# Patient Record
Sex: Female | Born: 1965 | Race: Black or African American | Hispanic: No | Marital: Single | Smoking: Former smoker
Health system: Southern US, Community
[De-identification: ages and names within clinical notes are randomized; demographics above are authoritative.]

## PROBLEM LIST (undated history)

## (undated) DIAGNOSIS — K589 Irritable bowel syndrome without diarrhea: Secondary | ICD-10-CM

## (undated) DIAGNOSIS — G629 Polyneuropathy, unspecified: Secondary | ICD-10-CM

## (undated) DIAGNOSIS — G4733 Obstructive sleep apnea (adult) (pediatric): Secondary | ICD-10-CM

## (undated) DIAGNOSIS — I4721 Torsades de pointes: Secondary | ICD-10-CM

## (undated) DIAGNOSIS — E119 Type 2 diabetes mellitus without complications: Secondary | ICD-10-CM

## (undated) DIAGNOSIS — I428 Other cardiomyopathies: Secondary | ICD-10-CM

## (undated) DIAGNOSIS — J189 Pneumonia, unspecified organism: Secondary | ICD-10-CM

## (undated) DIAGNOSIS — I472 Ventricular tachycardia: Secondary | ICD-10-CM

## (undated) DIAGNOSIS — I340 Nonrheumatic mitral (valve) insufficiency: Secondary | ICD-10-CM

## (undated) DIAGNOSIS — K219 Gastro-esophageal reflux disease without esophagitis: Secondary | ICD-10-CM

## (undated) DIAGNOSIS — I447 Left bundle-branch block, unspecified: Secondary | ICD-10-CM

## (undated) DIAGNOSIS — Q2730 Arteriovenous malformation, site unspecified: Secondary | ICD-10-CM

## (undated) DIAGNOSIS — I10 Essential (primary) hypertension: Secondary | ICD-10-CM

## (undated) DIAGNOSIS — D509 Iron deficiency anemia, unspecified: Secondary | ICD-10-CM

## (undated) DIAGNOSIS — I509 Heart failure, unspecified: Secondary | ICD-10-CM

## (undated) HISTORY — PX: APPENDECTOMY: SHX54

## (undated) HISTORY — DX: Left bundle-branch block, unspecified: I44.7

## (undated) HISTORY — DX: Heart failure, unspecified: I50.9

## (undated) HISTORY — DX: Gastro-esophageal reflux disease without esophagitis: K21.9

## (undated) HISTORY — DX: Iron deficiency anemia, unspecified: D50.9

## (undated) HISTORY — PX: CHOLECYSTECTOMY: SHX55

## (undated) HISTORY — PX: BREAST SURGERY: SHX581

## (undated) HISTORY — DX: Other cardiomyopathies: I42.8

## (undated) HISTORY — DX: Ventricular tachycardia: I47.2

## (undated) HISTORY — PX: COLONOSCOPY: SHX174

## (undated) HISTORY — PX: SMALL BOWEL ENTEROSCOPY: SHX2415

## (undated) HISTORY — DX: Irritable bowel syndrome, unspecified: K58.9

## (undated) HISTORY — DX: Torsades de pointes: I47.21

## (undated) HISTORY — DX: Essential (primary) hypertension: I10

## (undated) HISTORY — DX: Nonrheumatic mitral (valve) insufficiency: I34.0

## (undated) HISTORY — DX: Morbid (severe) obesity due to excess calories: E66.01

## (undated) HISTORY — PX: UPPER GASTROINTESTINAL ENDOSCOPY: SHX188

---

## 2001-04-09 ENCOUNTER — Ambulatory Visit (HOSPITAL_COMMUNITY): Admission: RE | Admit: 2001-04-09 | Discharge: 2001-04-09 | Payer: Self-pay | Admitting: Family Medicine

## 2001-04-09 ENCOUNTER — Encounter: Payer: Self-pay | Admitting: Family Medicine

## 2001-05-08 ENCOUNTER — Encounter: Payer: Self-pay | Admitting: *Deleted

## 2001-05-08 ENCOUNTER — Emergency Department (HOSPITAL_COMMUNITY): Admission: EM | Admit: 2001-05-08 | Discharge: 2001-05-08 | Payer: Self-pay | Admitting: *Deleted

## 2001-06-30 ENCOUNTER — Emergency Department (HOSPITAL_COMMUNITY): Admission: EM | Admit: 2001-06-30 | Discharge: 2001-06-30 | Payer: Self-pay | Admitting: *Deleted

## 2001-06-30 ENCOUNTER — Encounter: Payer: Self-pay | Admitting: *Deleted

## 2001-07-03 ENCOUNTER — Encounter: Payer: Self-pay | Admitting: Obstetrics and Gynecology

## 2001-07-03 ENCOUNTER — Ambulatory Visit (HOSPITAL_COMMUNITY): Admission: RE | Admit: 2001-07-03 | Discharge: 2001-07-03 | Payer: Self-pay | Admitting: Obstetrics and Gynecology

## 2001-08-22 ENCOUNTER — Encounter (HOSPITAL_COMMUNITY): Admission: RE | Admit: 2001-08-22 | Discharge: 2001-09-21 | Payer: Self-pay | Admitting: Rheumatology

## 2001-08-23 ENCOUNTER — Emergency Department (HOSPITAL_COMMUNITY): Admission: EM | Admit: 2001-08-23 | Discharge: 2001-08-23 | Payer: Self-pay | Admitting: Emergency Medicine

## 2001-09-11 HISTORY — PX: OTHER SURGICAL HISTORY: SHX169

## 2001-10-10 ENCOUNTER — Emergency Department (HOSPITAL_COMMUNITY): Admission: EM | Admit: 2001-10-10 | Discharge: 2001-10-10 | Payer: Self-pay | Admitting: *Deleted

## 2001-10-24 ENCOUNTER — Encounter (HOSPITAL_COMMUNITY): Admission: RE | Admit: 2001-10-24 | Discharge: 2001-11-23 | Payer: Self-pay | Admitting: Rheumatology

## 2001-12-10 ENCOUNTER — Encounter: Payer: Self-pay | Admitting: Emergency Medicine

## 2001-12-10 ENCOUNTER — Emergency Department (HOSPITAL_COMMUNITY): Admission: EM | Admit: 2001-12-10 | Discharge: 2001-12-10 | Payer: Self-pay | Admitting: Emergency Medicine

## 2002-05-03 ENCOUNTER — Encounter: Payer: Self-pay | Admitting: *Deleted

## 2002-05-03 ENCOUNTER — Emergency Department (HOSPITAL_COMMUNITY): Admission: EM | Admit: 2002-05-03 | Discharge: 2002-05-03 | Payer: Self-pay | Admitting: *Deleted

## 2002-05-07 ENCOUNTER — Encounter: Payer: Self-pay | Admitting: Orthopaedic Surgery

## 2002-05-07 ENCOUNTER — Ambulatory Visit (HOSPITAL_COMMUNITY): Admission: RE | Admit: 2002-05-07 | Discharge: 2002-05-07 | Payer: Self-pay | Admitting: Orthopaedic Surgery

## 2002-06-23 ENCOUNTER — Emergency Department (HOSPITAL_COMMUNITY): Admission: EM | Admit: 2002-06-23 | Discharge: 2002-06-23 | Payer: Self-pay | Admitting: *Deleted

## 2002-07-03 ENCOUNTER — Encounter: Payer: Self-pay | Admitting: Obstetrics and Gynecology

## 2002-07-03 ENCOUNTER — Ambulatory Visit (HOSPITAL_COMMUNITY): Admission: RE | Admit: 2002-07-03 | Discharge: 2002-07-03 | Payer: Self-pay | Admitting: Obstetrics and Gynecology

## 2002-07-18 ENCOUNTER — Ambulatory Visit (HOSPITAL_COMMUNITY): Admission: RE | Admit: 2002-07-18 | Discharge: 2002-07-18 | Payer: Self-pay | Admitting: General Surgery

## 2002-11-02 ENCOUNTER — Emergency Department (HOSPITAL_COMMUNITY): Admission: EM | Admit: 2002-11-02 | Discharge: 2002-11-03 | Payer: Self-pay | Admitting: Internal Medicine

## 2002-11-02 ENCOUNTER — Encounter: Payer: Self-pay | Admitting: *Deleted

## 2002-11-12 ENCOUNTER — Encounter: Payer: Self-pay | Admitting: Internal Medicine

## 2002-11-12 ENCOUNTER — Ambulatory Visit (HOSPITAL_COMMUNITY): Admission: RE | Admit: 2002-11-12 | Discharge: 2002-11-12 | Payer: Self-pay | Admitting: Internal Medicine

## 2002-11-28 ENCOUNTER — Encounter (HOSPITAL_COMMUNITY): Admission: RE | Admit: 2002-11-28 | Discharge: 2002-12-28 | Payer: Self-pay | Admitting: Internal Medicine

## 2002-11-28 ENCOUNTER — Encounter: Payer: Self-pay | Admitting: Internal Medicine

## 2002-12-09 ENCOUNTER — Encounter: Payer: Self-pay | Admitting: Internal Medicine

## 2002-12-12 ENCOUNTER — Emergency Department (HOSPITAL_COMMUNITY): Admission: EM | Admit: 2002-12-12 | Discharge: 2002-12-12 | Payer: Self-pay | Admitting: *Deleted

## 2003-01-07 ENCOUNTER — Ambulatory Visit (HOSPITAL_COMMUNITY): Admission: RE | Admit: 2003-01-07 | Discharge: 2003-01-07 | Payer: Self-pay | Admitting: General Surgery

## 2003-04-24 ENCOUNTER — Emergency Department (HOSPITAL_COMMUNITY): Admission: EM | Admit: 2003-04-24 | Discharge: 2003-04-24 | Payer: Self-pay | Admitting: Emergency Medicine

## 2003-05-01 ENCOUNTER — Ambulatory Visit: Admission: RE | Admit: 2003-05-01 | Discharge: 2003-05-01 | Payer: Self-pay | Admitting: Orthopedic Surgery

## 2003-05-01 ENCOUNTER — Encounter: Payer: Self-pay | Admitting: Orthopedic Surgery

## 2003-05-04 ENCOUNTER — Encounter: Payer: Self-pay | Admitting: Orthopedic Surgery

## 2003-05-04 ENCOUNTER — Ambulatory Visit (HOSPITAL_COMMUNITY): Admission: RE | Admit: 2003-05-04 | Discharge: 2003-05-04 | Payer: Self-pay | Admitting: Orthopedic Surgery

## 2003-07-29 ENCOUNTER — Ambulatory Visit (HOSPITAL_COMMUNITY): Admission: RE | Admit: 2003-07-29 | Discharge: 2003-07-29 | Payer: Self-pay | Admitting: General Surgery

## 2003-09-12 HISTORY — PX: KNEE ARTHROSCOPY: SUR90

## 2003-10-06 ENCOUNTER — Ambulatory Visit (HOSPITAL_COMMUNITY): Admission: RE | Admit: 2003-10-06 | Discharge: 2003-10-06 | Payer: Self-pay | Admitting: Ophthalmology

## 2003-10-13 ENCOUNTER — Encounter (HOSPITAL_COMMUNITY): Admission: RE | Admit: 2003-10-13 | Discharge: 2003-11-12 | Payer: Self-pay | Admitting: Orthopedic Surgery

## 2003-11-13 ENCOUNTER — Encounter (HOSPITAL_COMMUNITY): Admission: RE | Admit: 2003-11-13 | Discharge: 2003-12-13 | Payer: Self-pay | Admitting: Orthopedic Surgery

## 2003-11-17 ENCOUNTER — Emergency Department (HOSPITAL_COMMUNITY): Admission: EM | Admit: 2003-11-17 | Discharge: 2003-11-18 | Payer: Self-pay | Admitting: Emergency Medicine

## 2004-01-07 ENCOUNTER — Ambulatory Visit (HOSPITAL_COMMUNITY): Admission: RE | Admit: 2004-01-07 | Discharge: 2004-01-07 | Payer: Self-pay | Admitting: Family Medicine

## 2004-01-25 ENCOUNTER — Ambulatory Visit (HOSPITAL_COMMUNITY): Admission: RE | Admit: 2004-01-25 | Discharge: 2004-01-25 | Payer: Self-pay | Admitting: Family Medicine

## 2004-03-02 ENCOUNTER — Ambulatory Visit (HOSPITAL_COMMUNITY): Admission: RE | Admit: 2004-03-02 | Discharge: 2004-03-02 | Payer: Self-pay | Admitting: Family Medicine

## 2004-04-26 ENCOUNTER — Ambulatory Visit (HOSPITAL_COMMUNITY): Admission: RE | Admit: 2004-04-26 | Discharge: 2004-04-26 | Payer: Self-pay | Admitting: Neurology

## 2004-05-26 ENCOUNTER — Encounter (HOSPITAL_COMMUNITY): Admission: RE | Admit: 2004-05-26 | Discharge: 2004-06-10 | Payer: Self-pay | Admitting: Neurology

## 2004-06-14 ENCOUNTER — Encounter (HOSPITAL_COMMUNITY): Admission: RE | Admit: 2004-06-14 | Discharge: 2004-07-14 | Payer: Self-pay | Admitting: Neurology

## 2004-08-22 ENCOUNTER — Encounter (HOSPITAL_COMMUNITY): Admission: RE | Admit: 2004-08-22 | Discharge: 2004-09-21 | Payer: Self-pay | Admitting: Internal Medicine

## 2004-08-23 ENCOUNTER — Emergency Department (HOSPITAL_COMMUNITY): Admission: EM | Admit: 2004-08-23 | Discharge: 2004-08-23 | Payer: Self-pay | Admitting: Emergency Medicine

## 2005-05-05 ENCOUNTER — Ambulatory Visit: Payer: Self-pay | Admitting: *Deleted

## 2005-06-05 ENCOUNTER — Ambulatory Visit: Payer: Self-pay | Admitting: *Deleted

## 2005-06-05 ENCOUNTER — Ambulatory Visit: Payer: Self-pay | Admitting: Cardiology

## 2005-06-05 ENCOUNTER — Encounter (HOSPITAL_COMMUNITY): Admission: RE | Admit: 2005-06-05 | Discharge: 2005-06-06 | Payer: Self-pay | Admitting: *Deleted

## 2005-06-14 ENCOUNTER — Ambulatory Visit: Payer: Self-pay | Admitting: *Deleted

## 2005-06-29 ENCOUNTER — Ambulatory Visit (HOSPITAL_COMMUNITY): Admission: RE | Admit: 2005-06-29 | Discharge: 2005-06-29 | Payer: Self-pay | Admitting: *Deleted

## 2005-06-29 ENCOUNTER — Ambulatory Visit: Payer: Self-pay | Admitting: *Deleted

## 2005-07-13 ENCOUNTER — Ambulatory Visit: Payer: Self-pay | Admitting: *Deleted

## 2005-07-21 ENCOUNTER — Ambulatory Visit: Payer: Self-pay

## 2005-08-09 ENCOUNTER — Ambulatory Visit: Payer: Self-pay | Admitting: *Deleted

## 2005-10-27 ENCOUNTER — Ambulatory Visit: Payer: Self-pay | Admitting: *Deleted

## 2005-11-02 ENCOUNTER — Ambulatory Visit: Payer: Self-pay | Admitting: Cardiology

## 2005-11-06 ENCOUNTER — Ambulatory Visit: Payer: Self-pay | Admitting: *Deleted

## 2005-11-19 ENCOUNTER — Emergency Department (HOSPITAL_COMMUNITY): Admission: EM | Admit: 2005-11-19 | Discharge: 2005-11-19 | Payer: Self-pay | Admitting: Emergency Medicine

## 2005-12-06 ENCOUNTER — Ambulatory Visit: Payer: Self-pay | Admitting: Internal Medicine

## 2005-12-21 ENCOUNTER — Ambulatory Visit: Payer: Self-pay | Admitting: Internal Medicine

## 2005-12-21 ENCOUNTER — Inpatient Hospital Stay (HOSPITAL_COMMUNITY): Admission: AD | Admit: 2005-12-21 | Discharge: 2005-12-22 | Payer: Self-pay | Admitting: Internal Medicine

## 2005-12-22 ENCOUNTER — Encounter: Payer: Self-pay | Admitting: Internal Medicine

## 2006-01-10 ENCOUNTER — Ambulatory Visit: Payer: Self-pay

## 2006-01-23 ENCOUNTER — Ambulatory Visit: Payer: Self-pay | Admitting: *Deleted

## 2006-03-07 ENCOUNTER — Ambulatory Visit: Payer: Self-pay | Admitting: Internal Medicine

## 2006-03-16 ENCOUNTER — Ambulatory Visit: Payer: Self-pay

## 2006-03-29 ENCOUNTER — Ambulatory Visit: Payer: Self-pay | Admitting: Internal Medicine

## 2006-05-04 ENCOUNTER — Ambulatory Visit (HOSPITAL_COMMUNITY): Admission: RE | Admit: 2006-05-04 | Discharge: 2006-05-04 | Payer: Self-pay | Admitting: Cardiology

## 2006-05-04 ENCOUNTER — Ambulatory Visit: Payer: Self-pay | Admitting: Internal Medicine

## 2006-05-11 ENCOUNTER — Ambulatory Visit: Payer: Self-pay | Admitting: *Deleted

## 2006-05-30 ENCOUNTER — Ambulatory Visit: Payer: Self-pay

## 2006-05-30 ENCOUNTER — Encounter: Payer: Self-pay | Admitting: Cardiovascular Disease

## 2006-05-31 ENCOUNTER — Ambulatory Visit: Payer: Self-pay | Admitting: Internal Medicine

## 2006-06-14 ENCOUNTER — Ambulatory Visit: Payer: Self-pay | Admitting: Internal Medicine

## 2006-06-21 ENCOUNTER — Ambulatory Visit: Payer: Self-pay | Admitting: Internal Medicine

## 2006-07-05 ENCOUNTER — Ambulatory Visit: Payer: Self-pay | Admitting: Internal Medicine

## 2006-07-23 ENCOUNTER — Ambulatory Visit: Payer: Self-pay | Admitting: Internal Medicine

## 2006-08-11 ENCOUNTER — Emergency Department (HOSPITAL_COMMUNITY): Admission: EM | Admit: 2006-08-11 | Discharge: 2006-08-12 | Payer: Self-pay | Admitting: Emergency Medicine

## 2006-11-01 ENCOUNTER — Ambulatory Visit: Payer: Self-pay | Admitting: Internal Medicine

## 2006-12-04 ENCOUNTER — Emergency Department (HOSPITAL_COMMUNITY): Admission: EM | Admit: 2006-12-04 | Discharge: 2006-12-04 | Payer: Self-pay | Admitting: Emergency Medicine

## 2007-05-13 ENCOUNTER — Emergency Department (HOSPITAL_COMMUNITY): Admission: EM | Admit: 2007-05-13 | Discharge: 2007-05-13 | Payer: Self-pay | Admitting: Emergency Medicine

## 2007-05-13 ENCOUNTER — Ambulatory Visit: Payer: Self-pay | Admitting: Internal Medicine

## 2007-05-23 ENCOUNTER — Emergency Department (HOSPITAL_COMMUNITY): Admission: EM | Admit: 2007-05-23 | Discharge: 2007-05-23 | Payer: Self-pay | Admitting: Emergency Medicine

## 2007-06-05 ENCOUNTER — Ambulatory Visit: Payer: Self-pay | Admitting: Internal Medicine

## 2007-08-29 ENCOUNTER — Ambulatory Visit: Payer: Self-pay | Admitting: Internal Medicine

## 2007-10-24 ENCOUNTER — Ambulatory Visit (HOSPITAL_COMMUNITY): Admission: RE | Admit: 2007-10-24 | Discharge: 2007-10-24 | Payer: Self-pay | Admitting: Family Medicine

## 2007-11-25 ENCOUNTER — Emergency Department (HOSPITAL_COMMUNITY): Admission: EM | Admit: 2007-11-25 | Discharge: 2007-11-25 | Payer: Self-pay | Admitting: Emergency Medicine

## 2008-02-12 ENCOUNTER — Ambulatory Visit (HOSPITAL_COMMUNITY): Admission: RE | Admit: 2008-02-12 | Discharge: 2008-02-12 | Payer: Self-pay | Admitting: Family Medicine

## 2008-02-14 ENCOUNTER — Inpatient Hospital Stay (HOSPITAL_COMMUNITY): Admission: AD | Admit: 2008-02-14 | Discharge: 2008-02-17 | Payer: Self-pay | Admitting: Family Medicine

## 2008-02-14 ENCOUNTER — Ambulatory Visit: Payer: Self-pay | Admitting: Cardiology

## 2008-02-27 ENCOUNTER — Ambulatory Visit: Payer: Self-pay | Admitting: Internal Medicine

## 2008-02-28 ENCOUNTER — Ambulatory Visit: Payer: Self-pay | Admitting: Internal Medicine

## 2008-04-15 ENCOUNTER — Ambulatory Visit: Payer: Self-pay | Admitting: Cardiology

## 2008-05-22 ENCOUNTER — Ambulatory Visit: Payer: Self-pay | Admitting: Cardiology

## 2008-06-08 ENCOUNTER — Emergency Department (HOSPITAL_COMMUNITY): Admission: EM | Admit: 2008-06-08 | Discharge: 2008-06-08 | Payer: Self-pay | Admitting: Emergency Medicine

## 2008-06-17 ENCOUNTER — Ambulatory Visit (HOSPITAL_COMMUNITY): Admission: RE | Admit: 2008-06-17 | Discharge: 2008-06-17 | Payer: Self-pay | Admitting: Family Medicine

## 2008-07-08 ENCOUNTER — Emergency Department (HOSPITAL_COMMUNITY): Admission: EM | Admit: 2008-07-08 | Discharge: 2008-07-08 | Payer: Self-pay | Admitting: Emergency Medicine

## 2008-07-22 ENCOUNTER — Ambulatory Visit: Payer: Self-pay | Admitting: Internal Medicine

## 2008-07-27 ENCOUNTER — Ambulatory Visit: Payer: Self-pay | Admitting: Cardiology

## 2008-10-22 ENCOUNTER — Ambulatory Visit: Payer: Self-pay | Admitting: Internal Medicine

## 2008-11-04 ENCOUNTER — Ambulatory Visit: Payer: Self-pay | Admitting: Cardiology

## 2008-11-04 ENCOUNTER — Inpatient Hospital Stay (HOSPITAL_COMMUNITY): Admission: EM | Admit: 2008-11-04 | Discharge: 2008-11-06 | Payer: Self-pay | Admitting: Emergency Medicine

## 2008-12-08 ENCOUNTER — Encounter: Payer: Self-pay | Admitting: Internal Medicine

## 2008-12-17 ENCOUNTER — Ambulatory Visit: Payer: Self-pay | Admitting: Cardiology

## 2009-01-21 ENCOUNTER — Ambulatory Visit: Payer: Self-pay | Admitting: Internal Medicine

## 2009-03-23 ENCOUNTER — Encounter (INDEPENDENT_AMBULATORY_CARE_PROVIDER_SITE_OTHER): Payer: Self-pay | Admitting: *Deleted

## 2009-03-25 ENCOUNTER — Ambulatory Visit: Payer: Self-pay | Admitting: Cardiology

## 2009-03-26 LAB — CONVERTED CEMR LAB
BUN: 8 mg/dL (ref 6–23)
Calcium: 8.1 mg/dL — ABNORMAL LOW (ref 8.4–10.5)
Creatinine, Ser: 0.81 mg/dL (ref 0.40–1.20)
Glucose, Bld: 108 mg/dL — ABNORMAL HIGH (ref 70–99)

## 2009-03-29 ENCOUNTER — Telehealth (INDEPENDENT_AMBULATORY_CARE_PROVIDER_SITE_OTHER): Payer: Self-pay | Admitting: *Deleted

## 2009-04-20 ENCOUNTER — Ambulatory Visit (HOSPITAL_COMMUNITY): Admission: RE | Admit: 2009-04-20 | Discharge: 2009-04-20 | Payer: Self-pay | Admitting: Family Medicine

## 2009-04-22 ENCOUNTER — Ambulatory Visit: Payer: Self-pay | Admitting: Internal Medicine

## 2009-04-22 ENCOUNTER — Encounter: Payer: Self-pay | Admitting: Internal Medicine

## 2009-04-27 ENCOUNTER — Encounter: Payer: Self-pay | Admitting: Internal Medicine

## 2009-05-19 ENCOUNTER — Other Ambulatory Visit: Admission: RE | Admit: 2009-05-19 | Discharge: 2009-05-19 | Payer: Self-pay | Admitting: Obstetrics & Gynecology

## 2009-05-24 ENCOUNTER — Ambulatory Visit (HOSPITAL_COMMUNITY): Admission: RE | Admit: 2009-05-24 | Discharge: 2009-05-24 | Payer: Self-pay | Admitting: Obstetrics & Gynecology

## 2009-06-15 ENCOUNTER — Encounter: Payer: Self-pay | Admitting: Cardiology

## 2009-06-18 ENCOUNTER — Emergency Department (HOSPITAL_COMMUNITY): Admission: EM | Admit: 2009-06-18 | Discharge: 2009-06-18 | Payer: Self-pay | Admitting: Emergency Medicine

## 2009-07-03 ENCOUNTER — Inpatient Hospital Stay (HOSPITAL_COMMUNITY): Admission: EM | Admit: 2009-07-03 | Discharge: 2009-07-06 | Payer: Self-pay | Admitting: Emergency Medicine

## 2009-07-03 ENCOUNTER — Ambulatory Visit: Payer: Self-pay | Admitting: Cardiology

## 2009-07-04 ENCOUNTER — Ambulatory Visit: Payer: Self-pay | Admitting: Gastroenterology

## 2009-07-05 ENCOUNTER — Telehealth: Payer: Self-pay | Admitting: Gastroenterology

## 2009-07-05 ENCOUNTER — Ambulatory Visit: Payer: Self-pay | Admitting: Gastroenterology

## 2009-07-05 ENCOUNTER — Encounter: Payer: Self-pay | Admitting: Gastroenterology

## 2009-07-06 ENCOUNTER — Encounter (INDEPENDENT_AMBULATORY_CARE_PROVIDER_SITE_OTHER): Payer: Self-pay | Admitting: Family Medicine

## 2009-07-08 ENCOUNTER — Encounter: Payer: Self-pay | Admitting: Gastroenterology

## 2009-07-12 ENCOUNTER — Encounter (INDEPENDENT_AMBULATORY_CARE_PROVIDER_SITE_OTHER): Payer: Self-pay

## 2009-07-12 HISTORY — PX: GIVENS CAPSULE STUDY: SHX5432

## 2009-07-12 LAB — CONVERTED CEMR LAB
BUN: 9 mg/dL
Chloride: 102 meq/L
Hgb A1c MFr Bld: 6.1 %
MCV: 71.7 fL
Potassium: 4.1 meq/L

## 2009-07-15 ENCOUNTER — Ambulatory Visit (HOSPITAL_COMMUNITY): Admission: RE | Admit: 2009-07-15 | Discharge: 2009-07-15 | Payer: Self-pay | Admitting: Gastroenterology

## 2009-07-15 ENCOUNTER — Encounter: Payer: Self-pay | Admitting: Urgent Care

## 2009-07-15 ENCOUNTER — Ambulatory Visit: Payer: Self-pay | Admitting: Gastroenterology

## 2009-07-16 ENCOUNTER — Encounter (INDEPENDENT_AMBULATORY_CARE_PROVIDER_SITE_OTHER): Payer: Self-pay

## 2009-07-19 ENCOUNTER — Encounter: Payer: Self-pay | Admitting: Gastroenterology

## 2009-07-19 DIAGNOSIS — D509 Iron deficiency anemia, unspecified: Secondary | ICD-10-CM

## 2009-07-22 ENCOUNTER — Ambulatory Visit: Payer: Self-pay | Admitting: Internal Medicine

## 2009-07-26 ENCOUNTER — Encounter: Payer: Self-pay | Admitting: Internal Medicine

## 2009-07-29 ENCOUNTER — Encounter: Payer: Self-pay | Admitting: Internal Medicine

## 2009-08-27 ENCOUNTER — Encounter (INDEPENDENT_AMBULATORY_CARE_PROVIDER_SITE_OTHER): Payer: Self-pay

## 2009-09-14 ENCOUNTER — Encounter: Payer: Self-pay | Admitting: Cardiology

## 2009-09-15 ENCOUNTER — Ambulatory Visit: Payer: Self-pay | Admitting: Cardiology

## 2009-09-15 DIAGNOSIS — I5022 Chronic systolic (congestive) heart failure: Secondary | ICD-10-CM

## 2009-09-20 ENCOUNTER — Encounter: Payer: Self-pay | Admitting: Cardiology

## 2009-09-20 ENCOUNTER — Ambulatory Visit (HOSPITAL_COMMUNITY): Admission: RE | Admit: 2009-09-20 | Discharge: 2009-09-20 | Payer: Self-pay | Admitting: Cardiology

## 2009-09-20 ENCOUNTER — Ambulatory Visit: Payer: Self-pay | Admitting: Cardiology

## 2009-09-21 ENCOUNTER — Encounter (INDEPENDENT_AMBULATORY_CARE_PROVIDER_SITE_OTHER): Payer: Self-pay | Admitting: *Deleted

## 2009-09-22 ENCOUNTER — Encounter (INDEPENDENT_AMBULATORY_CARE_PROVIDER_SITE_OTHER): Payer: Self-pay | Admitting: *Deleted

## 2009-10-12 DIAGNOSIS — K219 Gastro-esophageal reflux disease without esophagitis: Secondary | ICD-10-CM | POA: Insufficient documentation

## 2009-10-21 ENCOUNTER — Ambulatory Visit: Payer: Self-pay | Admitting: Internal Medicine

## 2009-11-05 ENCOUNTER — Encounter: Payer: Self-pay | Admitting: Internal Medicine

## 2009-11-19 ENCOUNTER — Ambulatory Visit: Payer: Self-pay | Admitting: Cardiology

## 2009-12-31 ENCOUNTER — Inpatient Hospital Stay (HOSPITAL_COMMUNITY): Admission: EM | Admit: 2009-12-31 | Discharge: 2010-01-03 | Payer: Self-pay | Admitting: Emergency Medicine

## 2010-01-05 ENCOUNTER — Emergency Department (HOSPITAL_COMMUNITY): Admission: EM | Admit: 2010-01-05 | Discharge: 2010-01-05 | Payer: Self-pay | Admitting: Emergency Medicine

## 2010-01-31 ENCOUNTER — Encounter (INDEPENDENT_AMBULATORY_CARE_PROVIDER_SITE_OTHER): Payer: Self-pay | Admitting: *Deleted

## 2010-03-01 ENCOUNTER — Ambulatory Visit: Payer: Self-pay | Admitting: Internal Medicine

## 2010-03-01 DIAGNOSIS — Z95810 Presence of automatic (implantable) cardiac defibrillator: Secondary | ICD-10-CM

## 2010-04-28 ENCOUNTER — Observation Stay (HOSPITAL_COMMUNITY): Admission: EM | Admit: 2010-04-28 | Discharge: 2010-04-29 | Payer: Self-pay | Admitting: Emergency Medicine

## 2010-04-29 ENCOUNTER — Telehealth: Payer: Self-pay | Admitting: Gastroenterology

## 2010-05-04 ENCOUNTER — Ambulatory Visit: Payer: Self-pay | Admitting: Gastroenterology

## 2010-05-04 DIAGNOSIS — K625 Hemorrhage of anus and rectum: Secondary | ICD-10-CM

## 2010-05-04 DIAGNOSIS — K5900 Constipation, unspecified: Secondary | ICD-10-CM | POA: Insufficient documentation

## 2010-05-06 ENCOUNTER — Encounter: Payer: Self-pay | Admitting: Gastroenterology

## 2010-05-09 ENCOUNTER — Encounter: Payer: Self-pay | Admitting: Gastroenterology

## 2010-05-17 ENCOUNTER — Telehealth (INDEPENDENT_AMBULATORY_CARE_PROVIDER_SITE_OTHER): Payer: Self-pay

## 2010-05-20 ENCOUNTER — Telehealth (INDEPENDENT_AMBULATORY_CARE_PROVIDER_SITE_OTHER): Payer: Self-pay

## 2010-05-26 ENCOUNTER — Ambulatory Visit (HOSPITAL_COMMUNITY): Admission: RE | Admit: 2010-05-26 | Discharge: 2010-05-26 | Payer: Self-pay | Admitting: Hematology

## 2010-05-26 ENCOUNTER — Ambulatory Visit: Payer: Self-pay | Admitting: Gastroenterology

## 2010-06-02 ENCOUNTER — Ambulatory Visit: Payer: Self-pay | Admitting: Internal Medicine

## 2010-06-10 ENCOUNTER — Ambulatory Visit (HOSPITAL_COMMUNITY): Payer: Self-pay | Admitting: Oncology

## 2010-06-14 ENCOUNTER — Ambulatory Visit (HOSPITAL_COMMUNITY): Payer: Self-pay | Admitting: Oncology

## 2010-06-14 ENCOUNTER — Encounter (HOSPITAL_COMMUNITY)
Admission: RE | Admit: 2010-06-14 | Discharge: 2010-07-14 | Payer: Self-pay | Source: Home / Self Care | Admitting: Oncology

## 2010-07-04 ENCOUNTER — Ambulatory Visit: Payer: Self-pay | Admitting: Cardiology

## 2010-07-18 ENCOUNTER — Ambulatory Visit (HOSPITAL_COMMUNITY): Admission: RE | Admit: 2010-07-18 | Discharge: 2010-07-18 | Payer: Self-pay | Admitting: Family Medicine

## 2010-07-28 ENCOUNTER — Encounter (INDEPENDENT_AMBULATORY_CARE_PROVIDER_SITE_OTHER): Payer: Self-pay | Admitting: *Deleted

## 2010-08-01 ENCOUNTER — Ambulatory Visit (HOSPITAL_COMMUNITY): Payer: Self-pay | Admitting: Oncology

## 2010-08-01 ENCOUNTER — Encounter (HOSPITAL_COMMUNITY)
Admission: RE | Admit: 2010-08-01 | Discharge: 2010-08-31 | Payer: Self-pay | Source: Home / Self Care | Attending: Oncology | Admitting: Oncology

## 2010-08-10 ENCOUNTER — Encounter: Payer: Self-pay | Admitting: Gastroenterology

## 2010-08-15 ENCOUNTER — Ambulatory Visit: Payer: Self-pay | Admitting: Internal Medicine

## 2010-08-15 DIAGNOSIS — M549 Dorsalgia, unspecified: Secondary | ICD-10-CM | POA: Insufficient documentation

## 2010-09-01 ENCOUNTER — Encounter (HOSPITAL_COMMUNITY)
Admission: RE | Admit: 2010-09-01 | Discharge: 2010-10-01 | Payer: Self-pay | Source: Home / Self Care | Attending: Oncology | Admitting: Oncology

## 2010-09-08 ENCOUNTER — Ambulatory Visit: Payer: Self-pay | Admitting: Internal Medicine

## 2010-09-12 ENCOUNTER — Emergency Department (HOSPITAL_COMMUNITY)
Admission: EM | Admit: 2010-09-12 | Discharge: 2010-09-12 | Payer: Self-pay | Source: Home / Self Care | Admitting: Emergency Medicine

## 2010-09-13 ENCOUNTER — Encounter: Payer: Self-pay | Admitting: Internal Medicine

## 2010-09-14 ENCOUNTER — Ambulatory Visit (HOSPITAL_COMMUNITY)
Admission: RE | Admit: 2010-09-14 | Discharge: 2010-09-14 | Payer: Self-pay | Source: Home / Self Care | Attending: Family Medicine | Admitting: Family Medicine

## 2010-09-15 ENCOUNTER — Encounter: Payer: Self-pay | Admitting: Internal Medicine

## 2010-09-28 ENCOUNTER — Ambulatory Visit (HOSPITAL_COMMUNITY)
Admission: RE | Admit: 2010-09-28 | Discharge: 2010-10-11 | Payer: Self-pay | Source: Home / Self Care | Attending: Oncology | Admitting: Oncology

## 2010-10-03 ENCOUNTER — Encounter (HOSPITAL_COMMUNITY)
Admission: RE | Admit: 2010-10-03 | Discharge: 2010-10-11 | Payer: Self-pay | Source: Home / Self Care | Attending: Oncology | Admitting: Oncology

## 2010-10-03 LAB — RETICULOCYTES
Retic Count, Absolute: 43.1 10*3/uL (ref 19.0–186.0)
Retic Ct Pct: 1.1 % (ref 0.4–3.1)

## 2010-10-03 LAB — CBC
Hemoglobin: 10.4 g/dL — ABNORMAL LOW (ref 12.0–15.0)
MCV: 84.2 fL (ref 78.0–100.0)
Platelets: 404 10*3/uL — ABNORMAL HIGH (ref 150–400)
RBC: 3.92 MIL/uL (ref 3.87–5.11)
WBC: 6.8 10*3/uL (ref 4.0–10.5)

## 2010-10-03 LAB — FERRITIN: Ferritin: 26 ng/mL (ref 10–291)

## 2010-10-05 ENCOUNTER — Emergency Department (HOSPITAL_COMMUNITY)
Admission: EM | Admit: 2010-10-05 | Discharge: 2010-10-05 | Payer: Self-pay | Source: Home / Self Care | Admitting: Emergency Medicine

## 2010-10-05 LAB — DIFFERENTIAL
Basophils Absolute: 0 10*3/uL (ref 0.0–0.1)
Eosinophils Absolute: 0.2 10*3/uL (ref 0.0–0.7)
Eosinophils Relative: 2 % (ref 0–5)
Lymphocytes Relative: 34 % (ref 12–46)
Neutrophils Relative %: 59 % (ref 43–77)

## 2010-10-05 LAB — COMPREHENSIVE METABOLIC PANEL
ALT: 12 U/L (ref 0–35)
AST: 15 U/L (ref 0–37)
Albumin: 3.2 g/dL — ABNORMAL LOW (ref 3.5–5.2)
Alkaline Phosphatase: 66 U/L (ref 39–117)
Potassium: 3.8 mEq/L (ref 3.5–5.1)
Sodium: 135 mEq/L (ref 135–145)
Total Protein: 7.3 g/dL (ref 6.0–8.3)

## 2010-10-05 LAB — CBC
Platelets: 395 10*3/uL (ref 150–400)
RBC: 3.72 MIL/uL — ABNORMAL LOW (ref 3.87–5.11)
RDW: 16.9 % — ABNORMAL HIGH (ref 11.5–15.5)
WBC: 7.7 10*3/uL (ref 4.0–10.5)

## 2010-10-13 NOTE — Assessment & Plan Note (Signed)
Summary: 6 mth f/u per checkout on 11/19/09/tg      Allergies Added: NKDA  Visit Type:  Follow-up Primary Provider:  [name], M.D.  CC:  no cardiology complaints.  History of Present Illness: [name] returns today for further evaluation and management of her nonischemic or adenopathy.  The soreness in her mouth turned out to be severe iron deficiency. It responded well to 2 intravenous infusions of iron. She's been very happy with her coordinate care with the hematology center here as well as with Dr. [name] and Darrick Penna. Her last hemoglobin was over 12, had gotten down to 8.  Her weight is down 8 pounds. Her dyspnea is improved. Her fatigue is improved.  She's compliant with her medications.  Last echo was in January of this year was stable.    Current Medications (verified): 1)  Torsemide 20 Mg Tabs (Torsemide) .... Once Daily 2)  Coreg Cr 80 Mg Xr24h-Cap (Carvedilol Phosphate) .... Take 1 Tablet By Mouth Once A Day 3)  Nu-Iron 150 Mg Caps (Polysaccharide Iron Complex) .... Bid 4)  Omeprazole 40 Mg Cpdr (Omeprazole) .... Take 1 Tab Daily 5)  Bidil 20-37.5 Mg Tabs (Isosorb Dinitrate-Hydralazine) .... Take 1 Tab Three Times A Day 6)  Spironolactone 25 Mg Tabs (Spironolactone) .... Take 1/2 Tab Daily 7)  Benazepril Hcl 40 Mg Tabs (Benazepril Hcl) .... Take 1 Tab Daily 8)  Gabapentin 300 Mg Caps (Gabapentin) .... Take 1 Cap Am 2 Caps Pm 9)  K-Lor 20 Meq Pack (Potassium Chloride) .... Qid 10)  Prenatabs Fa  Tabs (Prenatal Vit-Fe Fumarate-Fa) .... Take 1 Tab Daily 11)  Cyclobenzaprine Hcl 10 Mg Tabs (Cyclobenzaprine Hcl) .... Take 1 Tab Three Times A Day 12)  Ventolin Hfa 108 (90 Base) Mcg/act Aers (Albuterol Sulfate) .... Use As Needed 13)  Vitamin C .... Bid  Allergies (verified): No Known Drug Allergies  Comments:  Nurse/Medical Assistant: patient reviewed med list from previous ov and stated all meds are correct  Past History:  Past Medical History: Last updated:  12/18/2008 Chronic systolic congestive heart failure Nonischemic cardiomyopathy Moderate to severe mitral regurgitation status post CRT-D implantationApril 2007 Normal coronary arteries by cardiac catheterization October 2006 Left bundle branch block Hypertension Asthma Irritable bowel syndrome with primarily constipation Morbid obesity GERD  Past Surgical History: Last updated: 05/04/2010 Appendectomy Cholecystectomy: biliary dyskinesia Left partial mastectomy 2003 Left knee arthroscopy 2005 CRT-D. implantation April 2007  Family History: Last updated: 05/04/2010 no premature CAD No FH of Colon Cancer or polyps  Social History: Last updated: 05/04/2010 nonsmoker Nondrinker Diasability for heart disease. Was a CNA. Single: 1 daughter, age 62 Alcohol Use - no Regular Exercise - no Drug Use - no  Risk Factors: Exercise: no (09/15/2009)  Review of Systems       negative other than history of present illness  Vital Signs:  Patient profile:   45 year old female Weight:      265 pounds BMI:     42.93 Pulse rate:   87 / minute BP sitting:   109 / 73  (right arm)  Vitals Entered By: Dreama Saa, CNA (July 04, 2010 3:58 PM)  Physical Exam  General:  family pleasant, no acute distress, morbidly obese Head:  normocephalic and atraumatic Eyes:  I did cover pain Neck:  Neck supple, no JVD. No masses, thyromegaly or abnormal cervical nodes. Lungs:  Clear bilaterally to auscultation and percussion. Heart:  MI non-appreciated, soft S1-S2, no gallop, carotid upstrokes equal bilaterally without bruit Msk:  decreased  ROM.   Pulses:  pulses normal in all 4 extremities Extremities:  trace left pedal edema and trace right pedal edema.   Neurologic:  Alert and oriented x 3. Skin:  Intact without lesions or rashes. Psych:  Normal affect.    ICD Specifications Following MD:  [name], MD     ICD Vendor:  [facility] [employer]     ICD Model Number:  H210     ICD  Serial Number:  [id number] ICD DOI:  12/21/2005     ICD Implanting MD:  [name], MD  Lead 1:    Location: RA     DOI: 12/21/2005     Model #: [id number]     Serial #: [id number]     Status: active Lead 2:    Location: RV     DOI: 12/21/2005     Model #: [id number]     Serial #: [id number]     Status: active Lead 3:    Location: LV     DOI: 12/21/2005     Model #: [id number]     Serial #: [id number]     Status: active  Indications::  ICM, CHF   ICD Follow Up ICD Dependent:  No      Episodes Coumadin:  No  Brady Parameters Mode DDD     Lower Rate Limit:  60     Upper Rate Limit 120 PAV 120      Tachy Zones VF:  240     VT:  200     VT1:  175     Patient Instructions: 1)  Your physician recommends that you schedule a follow-up appointment in: 3 months 2)  Your physician recommends that you continue on your current medications as directed. Please refer to the Current Medication list given to you today. 3)  ***Congratulations on your weight loss.

## 2010-10-13 NOTE — Procedures (Signed)
Summary: defib check per pt phone call/pt missed appt on 5/23/tg  Medications Added NU-IRON 150 MG CAPS (POLYSACCHARIDE IRON COMPLEX) 1 by mouth three times a day OMEPRAZOLE 40 MG CPDR (OMEPRAZOLE) take 1 tab daily VENTOLIN HFA 108 (90 BASE) MCG/ACT AERS (ALBUTEROL SULFATE) use as needed      Allergies Added: NKDA  Visit Type:  Follow-up Primary Provider:  Dr.Stephen [name]  CC:  no cardiology complaints.  History of Present Illness: [name] returns today for evaluation of her nonischemic cardiomyopathy and chronic systolic heart failure.She denies orthopnea, PND or peripheral edema. She is s/p BiV ICD and her CHF symptoms remain class 2.  She has not been able to lose much weight but has tried to [place] a low sodium diet.  No intercurrent ICD shocks.    Current Medications (verified): 1)  Torsemide 20 Mg Tabs (Torsemide) .... Take Two Tablets Once Daily 2)  Coreg Cr 80 Mg Xr24h-Cap (Carvedilol Phosphate) .... Take 1 Tablet By Mouth Once A Day 3)  Nu-Iron 150 Mg Caps (Polysaccharide Iron Complex) .Marland Kitchen.. 1 By Mouth Three Times A Day 4)  Omeprazole 40 Mg Cpdr (Omeprazole) .... Take 1 Tab Daily 5)  Bidil 20-37.5 Mg Tabs (Isosorb Dinitrate-Hydralazine) .... Take 1 Tab Three Times A Day 6)  Spironolactone 25 Mg Tabs (Spironolactone) .... Take 1/2 Tab Daily 7)  Benazepril Hcl 40 Mg Tabs (Benazepril Hcl) .... Take 1 Tab Daily 8)  Gabapentin 300 Mg Caps (Gabapentin) .... Take 1 Cap Am 2 Caps Pm 9)  K-Lor 20 Meq Pack (Potassium Chloride) .... Take 1 Tab Three Times A Day 10)  Prenatabs Fa  Tabs (Prenatal Vit-Fe Fumarate-Fa) .... Take 1 Tab Daily 11)  Cyclobenzaprine Hcl 10 Mg Tabs (Cyclobenzaprine Hcl) .... Take 1 Tab Three Times A Day 12)  Ventolin Hfa 108 (90 Base) Mcg/act Aers (Albuterol Sulfate) .... Use As Needed  Allergies (verified): No Known Drug Allergies  Past History:  Past Medical History: Last updated: 12/18/2008 Chronic systolic congestive heart failure Nonischemic  cardiomyopathy Moderate to severe mitral regurgitation status post CRT-D implantationApril 2007 Normal coronary arteries by cardiac catheterization October 2006 Left bundle branch block Hypertension Asthma Irritable bowel syndrome with primarily constipation Morbid obesity GERD  Past Surgical History: Last updated: 12/18/2008 appendectomy Cholecystectomy left partial mastectomy 2003 Left knee arthroscopy 2005 CRT-D. implantation April 2007  Review of Systems  The patient denies chest pain, syncope, dyspnea on exertion, and peripheral edema.    Vital Signs:  Patient profile:   45 year old female Weight:      268 pounds Pulse rate:   86 / minute BP sitting:   110 / 72  (right arm)  Vitals Entered By: Dreama Saa, CNA (March 01, 2010 10:32 AM)  Physical Exam  General:  Morbidly obese, well developed, well nourished, in no acute distress.  HEENT: normal Neck: supple. No JVD. Carotids 2+ bilaterally no bruits Cor: RRR no rubs, gallops or murmur. PMI could not be appreciated. Lungs: CTA. Well healed ICD incision. Ab: soft, nontender. nondistended. No HSM. Good bowel sounds Ext: warm. no cyanosis, clubbing or edema Neuro: alert and oriented. Grossly nonfocal. affect pleasant     ICD Specifications Following MD:  [name], MD     ICD Vendor:  [facility] [employer]     ICD Model Number:  H210     ICD Serial Number:  [id number] ICD DOI:  12/21/2005     ICD Implanting MD:  [name], MD  Lead 1:    Location: RA  DOI: 12/21/2005     Model #: 6578     Serial #: [id number]     Status: active Lead 2:    Location: RV     DOI: 12/21/2005     Model #: [id number]     Serial #: [id number]     Status: active Lead 3:    Location: LV     DOI: 12/21/2005     Model #: [id number]     Serial #: [id number]     Status: active  Indications::  ICM, CHF   ICD Follow Up Remote Check?  No Battery Voltage:  2.58 V     Charge Time:  7.7 seconds     Battery Est. Longevity:  MOL2 Underlying rhythm:  SR ICD  Dependent:  No       ICD Device Measurements Atrium:  Amplitude: 1.3 mV, Impedance: 529 ohms, Threshold: 0.8 V at 0.4 msec Right Ventricle:  Amplitude: 19.9 mV, Impedance: 600 ohms, Threshold: 0.6 V at 0.4 msec Left Ventricle:  Amplitude: 5.3 mV, Impedance: 955 ohms, Threshold: 0.2 V at 0.4 msec Shock Impedance: 37 ohms   Episodes MS Episodes:  0     Percent Mode Switch:  0     Coumadin:  No Shock:  0     ATP:  0     Nonsustained:  2     Atrial Pacing:  0%     Ventricular Pacing:  99%  Brady Parameters Mode DDD     Lower Rate Limit:  60     Upper Rate Limit 120 PAV 120      Tachy Zones VF:  240     VT:  200     VT1:  175     Next Remote Date:  06/02/2010     Next Cardiology Appt Due:  02/10/2011 Tech Comments:  No parameter changes.  Device function normal.  Battery MOL2.  Latitude transmissions every 3 months.  ROV 1 year with Dr. [name] in [place]. Altha Harm, LPN  March 01, 2010 10:52 AM  MD Comments:  Agree with above.  Impression & Recommendations:  Problem # 1:  AUTOMATIC IMPLANTABLE CARDIAC DEFIBRILLATOR SITU (ICD-V45.02) Her device is working normally.  Will recheck in several months.  Problem # 2:  SYSTOLIC HEART FAILURE, CHRONIC (ICD-428.22) She remains class 2.  Continue current meds and maintain a low sodium diet. Her updated medication list for this problem includes:    Torsemide 20 Mg Tabs (Torsemide) .Marland Kitchen... Take two tablets once daily    Coreg Cr 80 Mg Xr24h-cap (Carvedilol phosphate) .Marland Kitchen... Take 1 tablet by mouth once a day    Spironolactone 25 Mg Tabs (Spironolactone) .Marland Kitchen... Take 1/2 tab daily    Benazepril Hcl 40 Mg Tabs (Benazepril hcl) .Marland Kitchen... Take 1 tab daily  Patient Instructions: 1)  Your physician recommends that you schedule a follow-up appointment in: 1 year 2)  Your physician recommends that you continue on your current medications as directed. Please refer to the Current Medication list given to you today.

## 2010-10-13 NOTE — Progress Notes (Signed)
  Dr. [name] stopped me in hospital. Patient going home today. Transfused for anemia. Needs appt next week. Please arrange.  Appended Document:  LMOM    for patient to call me back to confirm appt for next week 05/04/10 @ 0945 w/SF

## 2010-10-13 NOTE — Cardiovascular Report (Signed)
Summary: Office Visit Remote   Office Visit Remote   Imported By: Roderic Ovens 09/13/2010 16:11:55  _____________________________________________________________________  External Attachment:    Type:   Image     Comment:   External Document

## 2010-10-13 NOTE — Assessment & Plan Note (Signed)
Summary: ANEMIA, ABD PAIN   Visit Type:  Follow-up Visit Primary Care Provider:  [name], M.D.  Chief Complaint:  hosp follow up and doing ok.  History of Present Illness: Seen in ED for abd pain APR 2011 which was associated with constipation. Rx: dehydration then had diarrhea. Seeing blood when she wipes. No problems swallowing, vomiting, black tarry stool, or rashes. ABd pain occurs every other month. A little nausea, mild abd pain today, mouth sore but no mouth sores,  Having heartburn/indigestion on a daily basis and drinking Pepto Bismol and he increased her OMP. Couldn't afford PROTONIX. Admitted last week: 2 u pRBCs. No menstrual cycle.   Current Medications (verified): 1)  Torsemide 20 Mg Tabs (Torsemide) .... Once Daily 2)  Coreg Cr 80 Mg Xr24h-Cap (Carvedilol Phosphate) .... Take 1 Tablet By Mouth Once A Day 3)  Nu-Iron 150 Mg Caps (Polysaccharide Iron Complex) .... Qid 4)  Omeprazole 40 Mg Cpdr (Omeprazole) .... Take 1 Tab Daily 5)  Bidil 20-37.5 Mg Tabs (Isosorb Dinitrate-Hydralazine) .... Take 1 Tab Three Times A Day 6)  Spironolactone 25 Mg Tabs (Spironolactone) .... Take 1/2 Tab Daily 7)  Benazepril Hcl 40 Mg Tabs (Benazepril Hcl) .... Take 1 Tab Daily 8)  Gabapentin 300 Mg Caps (Gabapentin) .... Take 1 Cap Am 2 Caps Pm 9)  K-Lor 20 Meq Pack (Potassium Chloride) .... Qid 10)  Prenatabs Fa  Tabs (Prenatal Vit-Fe Fumarate-Fa) .... Take 1 Tab Daily 11)  Cyclobenzaprine Hcl 10 Mg Tabs (Cyclobenzaprine Hcl) .... Take 1 Tab Three Times A Day 12)  Ventolin Hfa 108 (90 Base) Mcg/act Aers (Albuterol Sulfate) .... Use As Needed 13)  Vitamin C .... Qid  Allergies (verified): No Known Drug Allergies  Past History:  Past Medical History: Last updated: 12/18/2008 Chronic systolic congestive heart failure Nonischemic cardiomyopathy Moderate to severe mitral regurgitation status post CRT-D implantationApril 2007 Normal coronary arteries by cardiac catheterization October  2006 Left bundle branch block Hypertension Asthma Irritable bowel syndrome with primarily constipation Morbid obesity GERD  Past Surgical History: Appendectomy Cholecystectomy: biliary dyskinesia Left partial mastectomy 2003 Left knee arthroscopy 2005 CRT-D. 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 Head:  Normocephalic and atraumatic. Eyes:  PERRL, no icterus. Mouth:  No deformity or lesions. Neck:  Supple; no masses. Lungs:  Clear throughout to auscultation. Heart:  Regular rate and rhythm Abdomen:  Soft, nontender and nondistended. No masses noted. Normal bowel sounds. obese.   Extremities:  No edema noted. Neurologic:  Alert and  oriented x4;  grossly normal neurologically.  Impression & Recommendations:  Problem # 1:  RECTAL BLEEDING (ICD-569.3) Assessment New Most likely 2o to internal hemorrhoids. Differential diagnosis includes AVMs, colorectal polypy/cancer.  Repeat TCS/EGD with propofol. Pt not adequately sedated during last endoscopy. Follow in 4 mos.  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 Prior negative w/u-EGD/TCS/CE IN OCT/NOV 2010. Will refer to Dr. Mariel Sleet for IV iron infusions. Repeat TCS/EGD with propofol. Use iron and Vitamin C two times a day.  Problem # 3:  CONSTIPATION  (ICD-564.00) Use Benefiber 2 tsp two times a day. Drink 6 cups of water daily.   Problem # 4:  GASTROESOPHAGEAL REFLUX DISEASE, HX OF (ICD-V12.79) Assessment: Deteriorated Sx better controlled on OMP two times a day. Continue Omeprazole 40 mg 30 minutes before your first meal.  CC: PCP and Dr. Mariel Sleet  Patient Instructions: 1)  Use Benefiber 2 tsp two times a day. 2)  Drink 6 cups of water daily. 3)  Use iron and Vitamin C two times a day. 4)  Continue Omeprazole 40 mg 30 minutes before your first meal. 5)  Will refer to Dr. Mariel Sleet for IV iron infusions. 6)  Repeat TCS/EGD with propofol. 7)  Follow in 4 mos. 8)  The medication list was reviewed and reconciled.  All changed / newly prescribed medications were explained.  A complete medication list was provided to the patient / caregiver.  Appended Document: ANEMIA, ABD PAIN 4 MONTH F/U OV IS IN THE COMPUTER  Appended Document: Orders Update    Clinical Lists Changes  Orders: Added new Service order of Est. Patient Level IV (16109) - Signed      Appended Document: ANEMIA, ABD PAIN PT HAS AN AICD PLACED FOR EF 15-20%.

## 2010-10-13 NOTE — Letter (Signed)
Summary: Appointment - Missed  [facility] [place]  [address] [place] [address]   Phone: [phone]  Fax: [phone]     Jan 31, 2010 MRN: [id number]   [facility] [address] [place], [place]  [address]   Dear Ms. Staffa,  Our records indicate you missed your appointment on       5/236/11                 with Dr.   [name]    .                                    It is very important that we reach you to reschedule this appointment. We look forward to participating in your health care needs. Please contact us at the number listed above at your earliest convenience to reschedule this appointment.     Sincerely,    Glass blower/designer

## 2010-10-13 NOTE — Letter (Signed)
Summary: [facility] REFERRAL  [facility] REFERRAL   Imported By: Ave Filter 08/10/2010 12:42:38  _____________________________________________________________________  External Attachment:    Type:   Image     Comment:   External Document  Appended Document: [facility] REFERRAL Pt has an appt. 09/13/10@10 :00am

## 2010-10-13 NOTE — Assessment & Plan Note (Signed)
Summary: F3M      Allergies Added: NKDA  Visit Type:  Follow-up Primary Provider:  Dr.Stephen [name]  CC:  no complaints today.  History of Present Illness: [name] returns today for evaluation of her nonischemic cardiomyopathy and chronic systolic heart failure.  Her weight is down 1 pound. She denies orthopnea, PND or peripheral edema. Her mouth soreness which she thinks is from Bi-Dil has improved with using Pepto-Bismol. She continues on a proton pump inhibitor as well. Please see my previous note.  Current Medications (verified): 1)  Torsemide 20 Mg Tabs (Torsemide) .... Take Two Tablets Once Daily 2)  Coreg Cr 80 Mg Xr24h-Cap (Carvedilol Phosphate) .... Take 1 Tablet By Mouth Once A Day 3)  Nu-Iron 150 Mg Caps (Polysaccharide Iron Complex) .Marland Kitchen.. 1 By Mouth Bid 4)  Omeprazole 20 Mg Cpdr (Omeprazole) .... One By Mouth Daily For Acid Reflux 30 Min Before Breakfast 5)  Bidil 20-37.5 Mg Tabs (Isosorb Dinitrate-Hydralazine) .... Take 1 Tab Three Times A Day 6)  Spironolactone 25 Mg Tabs (Spironolactone) .... Take 1/2 Tab Daily 7)  Benazepril Hcl 40 Mg Tabs (Benazepril Hcl) .... Take 1 Tab Daily 8)  Gabapentin 300 Mg Caps (Gabapentin) .... Take 1 Cap Am 2 Caps Pm 9)  K-Lor 20 Meq Pack (Potassium Chloride) .... Take 1 Tab Three Times A Day 10)  Prenatabs Fa  Tabs (Prenatal Vit-Fe Fumarate-Fa) .... Take 1 Tab Daily 11)  Cyclobenzaprine Hcl 10 Mg Tabs (Cyclobenzaprine Hcl) .... Take 1 Tab Three Times A Day  Allergies (verified): No Known Drug Allergies  Past History:  Past Medical History: Last updated: 12/18/2008 Chronic systolic congestive heart failure Nonischemic cardiomyopathy Moderate to severe mitral regurgitation status post CRT-D implantationApril 2007 Normal coronary arteries by cardiac catheterization October 2006 Left bundle branch block Hypertension Asthma Irritable bowel syndrome with primarily constipation Morbid obesity GERD  Past Surgical  History: Last updated: 12/18/2008 appendectomy Cholecystectomy left partial mastectomy 2003 Left knee arthroscopy 2005 CRT-D. implantation April 2007  Family History: Last updated: 12/18/2008 no premature CAD  Social History: Last updated: 09/15/2009 nonsmoker Nondrinker Full Time Single  Alcohol Use - no Regular Exercise - no Drug Use - no patient has 1 daughter  Risk Factors: Exercise: no (09/15/2009)  Review of Systems       negative other than history of present illness  Vital Signs:  Patient profile:   46 year old female Weight:      272 pounds Pulse rate:   83 / minute BP sitting:   112 / 78  (right arm)  Vitals Entered By: Dreama Saa, CNA (November 19, 2009 11:31 AM)  Physical Exam  General:  obese.   Head:  normocephalic and atraumatic Eyes:  PERRLA/EOM intact; conjunctiva and lids normal. Neck:  Neck supple, no JVD. No masses, thyromegaly or abnormal cervical nodes. Chest Rudolf Blizard:  no deformities or breast masses noted Lungs:  Clear bilaterally to auscultation and percussion. Heart:  PMI difficult to appreciate, normal S1-S2, no gallop Msk:  Back normal, normal gait. Muscle strength and tone normal. Pulses:  pulses normal in all 4 extremities Extremities:  trace left pedal edema and trace right pedal edema.   Neurologic:  Alert and oriented x 3. Skin:  Intact without lesions or rashes. Psych:  Normal affect.    ICD Specifications ICD Vendor:  [employer]     ICD Model Number:  H210     ICD Serial Number:  [id number] ICD DOI:  12/21/2005     ICD Implanting MD:  [name], MD  Lead 1:    Location: RA     DOI: 12/21/2005     Model #: [id number]     Serial #: [id number]     Status: active Lead 2:    Location: RV     DOI: 12/21/2005     Model #: 3818     Serial #: [id number]     Status: active Lead 3:    Location: LV     DOI: 12/21/2005     Model #: 6967     Serial #: [id number]     Status: active  Indications::  ICM, CHF   Impression &  Recommendations:  Problem # 1:  SYSTOLIC HEART FAILURE, CHRONIC (ICD-428.22) Assessment Unchanged  The following medications were removed from the medication list:    Nitrostat 0.4 Mg Subl (Nitroglycerin) .Marland Kitchen... 1 tablet under tongue at onset of chest pain; you may repeat every 5 minutes for up to 3 doses. Her updated medication list for this problem includes:    Torsemide 20 Mg Tabs (Torsemide) .Marland Kitchen... Take two tablets once daily    Coreg Cr 80 Mg Xr24h-cap (Carvedilol phosphate) .Marland Kitchen... Take 1 tablet by mouth once a day    Spironolactone 25 Mg Tabs (Spironolactone) .Marland Kitchen... Take 1/2 tab daily    Benazepril Hcl 40 Mg Tabs (Benazepril hcl) .Marland Kitchen... Take 1 tab daily  Problem # 2:  CHEST PAIN-PRECORDIAL (ICD-786.51) Assessment: Improved  The following medications were removed from the medication list:    Nitrostat 0.4 Mg Subl (Nitroglycerin) .Marland Kitchen... 1 tablet under tongue at onset of chest pain; you may repeat every 5 minutes for up to 3 doses. Her updated medication list for this problem includes:    Coreg Cr 80 Mg Xr24h-cap (Carvedilol phosphate) .Marland Kitchen... Take 1 tablet by mouth once a day    Benazepril Hcl 40 Mg Tabs (Benazepril hcl) .Marland Kitchen... Take 1 tab daily  Patient Instructions: 1)  Your physician recommends that you schedule a follow-up appointment in: 6 months 2)  Your physician recommends that you continue on your current medications as directed. Please refer to the Current Medication list given to you today.

## 2010-10-13 NOTE — Letter (Signed)
Summary: DR Ivin Booty  DR Mariel Sleet REFERRAL   Imported By: Ave Filter 05/26/2010 12:39:11  _____________________________________________________________________  External Attachment:    Type:   Image     Comment:   External Document  Appended Document: DR Mariel Sleet REFERRAL Pt has an appt. 06/10/10@2 :30p.m.  Appended Document: DR Mariel Sleet REFERRAL Gave appt to Noni in Short Stay.

## 2010-10-13 NOTE — Assessment & Plan Note (Signed)
Summary: 3 mth f/u per checkout on 03/25/09/tg  Medications Added BIDIL 20-37.5 MG TABS (ISOSORB DINITRATE-HYDRALAZINE) take 1 tab three times a day SPIRONOLACTONE 25 MG TABS (SPIRONOLACTONE) take 1/2 tab daily BENAZEPRIL HCL 40 MG TABS (BENAZEPRIL HCL) take 1 tab daily GABAPENTIN 300 MG CAPS (GABAPENTIN) take 1 cap am 2 caps pm K-LOR 20 MEQ PACK (POTASSIUM CHLORIDE) take 1 tab three times a day PRENATABS FA  TABS (PRENATAL VIT-FE FUMARATE-FA) take 1 tab daily CYCLOBENZAPRINE HCL 10 MG TABS (CYCLOBENZAPRINE HCL) take 1 tab three times a day NITROSTAT 0.4 MG SUBL (NITROGLYCERIN) 1 tablet under tongue at onset of chest pain; you may repeat every 5 minutes for up to 3 doses.      Allergies Added: NKDA  Visit Type:  Follow-up Primary Provider:  Dr.Stephen [name]   History of Present Illness: Ms Ganesh returns today for evaluation and management of her chronic systolic nonischemic cardiomyopathy.  Her weight is remaining stable since I last saw her. She denies orthopnea PND or peripheral edema.  Her biggest complaint is her mouth being sore after she takes her isosorbide and hydralazine. She also takes potassium 3 times a day which may be the source.  She's had GI evaluation which showed no esophageal disease. She denies any nausea vomiting abdominal pain melena or hematochezia.  She occasionally has some chest tightness which is currently bothered by deal about 20 minutes at she takes it. Looking back at her chart, she had a stress Myoview that was difficult to read in 2006. Subsequent catheter showed normal coronary arteries.  Clinical Reports Reviewed:  Cardiac Cath:  06/29/2005: Cardiac Cath Findings:    CORONARY ANGIOGRAPHY:  The left main coronary artery is absent.   The left anterior descending coronary artery is a large trans-apical vessel,  which appears to have, if not a separate ostium, then a cloacal ostium.  There are 3 diagonals. The entire LAD system is free of  disease.   Circumflex coronary artery also has a separate ostium. It is a dominant  vessel with 2 large obtuse marginals and a large posterior descending. It is  free of disease.   The right coronary artery is a small dominant vessel, which is free of  disease.   Left ventriculogram reveals global hypokinesis with an ejection fraction of  25% and 1+ mitral regurgitation. Cardiac Cath Findings:   RESULTS:  PRESSURES:  1.  Right atrium A-wave 15, V-wave 14, mean of 12 mmHg.  2.  Right ventricular pressure 38/4 with an end-diastolic pressure of 10      mmHg.  3.  Pulmonary artery pressure 29/8 with a mean of 19 mmHg.  4.  Pulmonary capillary wedge pressure A-wave 24, V-wave 24, mean of 23      mmHg.  5.  The aortic pressure 116/76 with a mean of 93 mmHg.  6.  Left ventricular pressure 122/8 with an end-diastolic pressure of 18      mmHg.  7.  Pulmonary artery saturation 63%. Aortic saturation 91%.  8.  Cardiac output by the Fick method 4.5 with an index of 2.0 by      thermodilution, 6.7 with an index of 3.0.   CORONARY ANGIOGRAPHY:  The left main coronary artery is absent.   The left anterior descending coronary artery is a large trans-apical vessel,  which appears to have, if not a separate ostium, then a cloacal ostium.  There are 3 diagonals. The entire LAD system is free of disease.   Circumflex coronary artery  also has a separate ostium. It is a dominant  vessel with 2 large obtuse marginals and a large posterior descending. It is  free of disease.   The right coronary artery is a small dominant vessel, which is free of  disease.   Left ventriculogram reveals global hypokinesis with an ejection fraction of  25% and 1+ mitral regurgitation.   ASSESSMENT:  1.  Normal coronaries.  2.  Cardiomyopathy.  3.  Normal right heart pressures.   PLAN:  1.  Continue medical therapy.  2.  Followup with me in  in 4 to 6 weeks.   Vida Roller, M.D.  Electronically  Signed  Nuclear Study:  06/06/2005:   IMPRESSION:   This is an abnormal perfusion scan in a patient with known   cardiomyopathy.  This patchy uptake appears to represent myocardial   fibrosis, however, the patient is morbidly obese at 276 pounds so   this may also represent significant attenuation artifact.  Clinical   correlation is advised.    Read By:  Clyde Lundborg,  M.D.   Released By:  Clyde Lundborg,  M.D.  Additional Information  External image : [id number]   Current Medications (verified): 1)  Torsemide 20 Mg Tabs (Torsemide) .... Take Two Tablets Once Daily 2)  Coreg Cr 80 Mg Xr24h-Cap (Carvedilol Phosphate) .... Take 1 Tablet By Mouth Once A Day 3)  Nu-Iron 150 Mg Caps (Polysaccharide Iron Complex) .Marland Kitchen.. 1 By Mouth Bid 4)  Omeprazole 20 Mg Cpdr (Omeprazole) .... One By Mouth Daily For Acid Reflux 30 Min Before Breakfast 5)  Bidil 20-37.5 Mg Tabs (Isosorb Dinitrate-Hydralazine) .... Take 1 Tab Three Times A Day 6)  Spironolactone 25 Mg Tabs (Spironolactone) .... Take 1/2 Tab Daily 7)  Benazepril Hcl 40 Mg Tabs (Benazepril Hcl) .... Take 1 Tab Daily 8)  Gabapentin 300 Mg Caps (Gabapentin) .... Take 1 Cap Am 2 Caps Pm 9)  K-Lor 20 Meq Pack (Potassium Chloride) .... Take 1 Tab Three Times A Day 10)  Prenatabs Fa  Tabs (Prenatal Vit-Fe Fumarate-Fa) .... Take 1 Tab Daily 11)  Cyclobenzaprine Hcl 10 Mg Tabs (Cyclobenzaprine Hcl) .... Take 1 Tab Three Times A Day 12)  Nitrostat 0.4 Mg Subl (Nitroglycerin) .Marland Kitchen.. 1 Tablet Under Tongue At Onset of Chest Pain; You May Repeat Every 5 Minutes For Up To 3 Doses.  Allergies (verified): No Known Drug Allergies  Past History:  Past Medical History: Last updated: 12/18/2008 Chronic systolic congestive heart failure Nonischemic cardiomyopathy Moderate to severe mitral regurgitation status post CRT-D implantationApril 2007 Normal coronary arteries by cardiac catheterization October 2006 Left bundle branch  block Hypertension Asthma Irritable bowel syndrome with primarily constipation Morbid obesity GERD  Past Surgical History: Last updated: 12/18/2008 appendectomy Cholecystectomy left partial mastectomy 2003 Left knee arthroscopy 2005 CRT-D. implantation April 2007  Family History: Last updated: 12/18/2008 no premature CAD  Social History: Last updated: 09/15/2009 nonsmoker Nondrinker Full Time Single  Alcohol Use - no Regular Exercise - no Drug Use - no patient has 1 daughter  Risk Factors: Exercise: no (09/15/2009)  Review of Systems       negative other than history of present illness  Vital Signs:  Patient profile:   45 year old female Height:      66 inches Weight:      273 pounds BMI:     44.22 Pulse rate:   97 / minute BP sitting:   110 / 66  (right arm)  Vitals Entered By: Dreama Saa, CNA (  September 15, 2009 11:27 AM)  Physical Exam  General:  obese.   Head:  normocephalic and atraumatic Eyes:  PERRLA/EOM intact; conjunctiva and lids normal. Mouth:  no lesions or irritation  Neck:  Neck supple, no JVD. No masses, thyromegaly or abnormal cervical nodes. Lungs:  Clear bilaterally to auscultation and percussion. Heart:  PMI difficult to appreciate, no S3 gallop Msk:  Back normal, normal gait. Muscle strength and tone normal. Pulses:  pulses normal in all 4 extremities Extremities:  No clubbing or cyanosis. Neurologic:  Alert and oriented x 3. Skin:  Intact without lesions or rashes. Psych:  Normal affect.   Problems:  Medical Problems Added: 1)  Dx of Chest Pain-precordial  (ICD-786.51) 2)  Dx of Systolic Heart Failure, Chronic  (ICD-428.22)   ICD Specifications ICD Vendor:  [employer]     ICD Model Number:  H210     ICD Serial Number:  [id number] ICD DOI:  12/21/2005     ICD Implanting MD:  [name], MD  Lead 1:    Location: RA     DOI: 12/21/2005     Model #: [id number]     Serial #: [id number]     Status: active Lead 2:    Location: RV      DOI: 12/21/2005     Model #: [id number]     Serial #: [id number]     Status: active Lead 3:    Location: LV     DOI: 12/21/2005     Model #: 2956     Serial #: [id number]     Status: active  Indications::  ICM, CHF   Impression & Recommendations:  Problem # 1:  SYSTOLIC HEART FAILURE, CHRONIC (ICD-428.22) Assessment Unchanged  She has not had a 2-D echocardiogram since 07. We'll obtain. Followup in 3 months. The following medications were removed from the medication list:    Aldactone 25 Mg Tabs (Spironolactone) .Marland Kitchen... Take 1/2 tablet daily Her updated medication list for this problem includes:    Torsemide 20 Mg Tabs (Torsemide) .Marland Kitchen... Take two tablets once daily    Coreg Cr 80 Mg Xr24h-cap (Carvedilol phosphate) .Marland Kitchen... Take 1 tablet by mouth once a day    Spironolactone 25 Mg Tabs (Spironolactone) .Marland Kitchen... Take 1/2 tab daily    Benazepril Hcl 40 Mg Tabs (Benazepril hcl) .Marland Kitchen... Take 1 tab daily    Nitrostat 0.4 Mg Subl (Nitroglycerin) .Marland Kitchen... 1 tablet under tongue at onset of chest pain; you may repeat every 5 minutes for up to 3 doses.  Orders: 2-D Echocardiogram (2D Echo)  Problem # 2:  CHEST PAIN-PRECORDIAL (VHQ-469.62) Assessment: New It Is not clear what the source of this is. She's apparently had a negative GI workup. She had cardiac catheterization for equivocal stress study in 2006 which showed normal coronary arteries. I will give her p.r.n. sublingual nitroglycerin. As mentioned above, she thinks that the Bi-Dil is making her mouth sore. I've asked her stop it for a few days to see if it improves. If it does not, she'll go back on it. She may stay on it anyway after I explained the benefit. Her updated medication list for this problem includes:    Coreg Cr 80 Mg Xr24h-cap (Carvedilol phosphate) .Marland Kitchen... Take 1 tablet by mouth once a day    Benazepril Hcl 40 Mg Tabs (Benazepril hcl) .Marland Kitchen... Take 1 tab daily    Nitrostat 0.4 Mg Subl (Nitroglycerin) .Marland Kitchen... 1 tablet under tongue at onset of chest pain;  you may repeat  every 5 minutes for up to 3 doses.  Patient Instructions: 1)  Your physician recommends that you schedule a follow-up appointment in: 3 MONTHS 2)  Your physician recommends that you continue on your current medications as directed. Please refer to the Current Medication list given to you today. 3)  Your physician has requested that you have an echocardiogram.  Echocardiography is a painless test that uses sound waves to create images of your heart. It provides your doctor with information about the size and shape of your heart and how well your heart's chambers and valves are working.  This procedure takes approximately one hour. There are no restrictions for this procedure. Prescriptions: NITROSTAT 0.4 MG SUBL (NITROGLYCERIN) 1 tablet under tongue at onset of chest pain; you may repeat every 5 minutes for up to 3 doses.  #25 x 2   Entered by:   Larita Fife Via LPN   Authorized by:   Gaylord Shih, MD, [facility]   Signed by:   Larita Fife Via LPN on 27/02/2375   Method used:   Electronically to        [facility]* (retail)       [address]       [place], [place]  [address]       Ph: [phone]       Fax: [phone]   RxID:   [id number]

## 2010-10-13 NOTE — Letter (Signed)
Summary: tcs/egd orders  tcs/egd orders   Imported By: Rosine Beat 05/09/2010 16:40:41  _____________________________________________________________________  External Attachment:    Type:   Image     Comment:   External Document

## 2010-10-13 NOTE — Letter (Signed)
Summary: TCS/EGD ORDER  TCS/EGD ORDER   Imported By: Rosine Beat 05/06/2010 11:32:23  _____________________________________________________________________  External Attachment:    Type:   Image     Comment:   External Document

## 2010-10-13 NOTE — Miscellaneous (Signed)
Summary: kcl refill  Clinical Lists Changes

## 2010-10-13 NOTE — Letter (Signed)
Summary: [facility] GI NOTE  [facility] GI NOTE   Imported By: Rexene Alberts 09/15/2010 16:12:43  _____________________________________________________________________  External Attachment:    Type:   Image     Comment:   External Document

## 2010-10-13 NOTE — Miscellaneous (Signed)
Summary: klor con refill  Clinical Lists Changes  Medications: Rx of K-LOR 20 MEQ PACK (POTASSIUM CHLORIDE) take 1 tab three times a day;  #90 x 6;  Signed;  Entered by: Teressa Lower RN;  Authorized by: Kathlen Brunswick, MD, [facility];  Method used: Electronically to [facility] [address], [place], [place], [place]  [address], Ph: [phone], Fax: [phone]    Prescriptions: K-LOR 20 MEQ PACK (POTASSIUM CHLORIDE) take 1 tab three times a day  #90 x 6   Entered by:   Teressa Lower RN   Authorized by:   Kathlen Brunswick, MD, [facility]   Signed by:   Teressa Lower RN on 09/21/2009   Method used:   Electronically to        [facility]* (retail)       [address]       [place], [place]  [address]       Ph: [phone]       Fax: [phone]   RxID:   [id number]

## 2010-10-13 NOTE — Assessment & Plan Note (Signed)
Summary: 4 MONTH F/U OV/LAW   Visit Type:  Follow-up Visit Primary Care [name]:  [name]  Chief Complaint:  F/U .  History of Present Illness: [name] is here for f/u IDA. Recently Dr. Darrick Penna spoke with Dr. Mariel Sleet and it was decided she needed referral to [facility] for persistent IDA and transfusion dependent anemia.  Pt has had: 2010: TCS/EGD/[place]-no source identified, and in 2011: TCS/Push enteroscopy-no source identified. Ferritin increaseed from 14 to 22 w/ IVFE. But down to 11 on 08/11/10.  PATH: MILD GASTRITIS, Duodenal Bx: nl, hyperplastic polyps.  She denies overt GI bleeding. No menstrual cycle in 3 years.   She has received three blood transfusions in 10/10, 4/11, 9/11. She has received iron infusions since10/11. Two in 10/11, missed first two weeks in 11/11 due to influenza. She rec'd infusion on 11/22 and 11/29. Her ferritin went from 14 in 9/11 to 22 on 11/21 and 11 on 12/1. Her Hgb went from .7 on 11/21 to 10.1 on 12/11.  She denies overt GI bleeding. No abd pain, n/v, melena, brbpr. Denies heartburn. Last Friday, she had some nausea followed by constipation. After BM she felt better. She c/o recent cough/congestion. She has chronic neuropathy but over last few days c/o increased back pain with pain in left hip and left ribs. She is followed by Dr. Gerilyn Pilgrim and Dr. [name]. She denies urinary symptoms or rash.      Current Medications (verified): 1)  Torsemide 20 Mg Tabs (Torsemide) .... Once Daily 2)  Coreg Cr 80 Mg Xr24h-Cap (Carvedilol Phosphate) .... Take 1 Tablet By Mouth Once A Day 3)  Omeprazole 40 Mg Cpdr (Omeprazole) .... Take 1 Tab Daily 4)  Bidil 20-37.5 Mg Tabs (Isosorb Dinitrate-Hydralazine) .... Take 1 Tab Three Times A Day 5)  Spironolactone 25 Mg Tabs (Spironolactone) .... Take 1/2 Tab Daily 6)  Benazepril Hcl 40 Mg Tabs (Benazepril Hcl) .... Take 1 Tab Daily 7)  Gabapentin 300 Mg Caps (Gabapentin) .... Take 1 Cap Am 2 Caps Pm 8)  K-Lor 20 Meq Pack  (Potassium Chloride) .... Qid 9)  Prenatabs Fa  Tabs (Prenatal Vit-Fe Fumarate-Fa) .... Take 1 Tab Daily 10)  Cyclobenzaprine Hcl 10 Mg Tabs (Cyclobenzaprine Hcl) .... Take 1 Tab Three Times A Day As Needed 11)  Ventolin Hfa 108 (90 Base) Mcg/act Aers (Albuterol Sulfate) .... Use As Needed  Allergies (verified): No Known Drug Allergies  Past History:  Past Medical History: Chronic systolic congestive heart failure Nonischemic cardiomyopathy Moderate to severe mitral regurgitation status post CRT-D implantationApril 2007 Normal coronary arteries by cardiac catheterization October 2006 Left bundle branch block Hypertension Asthma Irritable bowel syndrome with primarily constipation Morbid obesity GERD IDA with w/u as outlined in HPI on 12.5.11,  Review of Systems      See HPI  Vital Signs:  Patient profile:   45 year old female Height:      66 inches Weight:      278 pounds BMI:     45.03 Temp:     99.1 degrees F oral Pulse rate:   80 / minute BP sitting:   110 / 78  (left arm) Cuff size:   large  Vitals Entered By: Cloria Spring LPN (August 15, 2010 11:11 AM)  Physical Exam  General:  Well developed, well nourished, no acute distress. Head:  Normocephalic and atraumatic. Eyes:  sclera nonicteric Mouth:  op moist Lungs:  Clear throughout to auscultation. Heart:  Regular rate and rhythm; no murmurs, rubs,  or bruits. Abdomen:  Bowel sounds normal.  Abdomen is soft, nontender, nondistended.  No rebound or guarding.  No hepatosplenomegaly, masses or hernias.  No abdominal bruits. She has tenderness with palpation of left rib cage and thoracic back region. No obvious bony abnormalities. Extremities:  No clubbing, cyanosis, edema or deformities noted. Neurologic:  Alert and  oriented x4;  grossly normal neurologically. Skin:  Intact without significant lesions or rashes. Psych:  Alert and cooperative. Normal mood and affect.  Impression & Recommendations:  Problem #  1:  ANEMIA, IRON DEFICIENCY (ICD-280.9)  Persistent IDA on iron infusions. Referral has been initiated to [facility]. No overt GI bleeding. Await referral and continue f/u with Dr. Mariel Sleet.   Orders: Est. Patient Level II ([address])  Problem # 2:  BACK PAIN (ICD-724.5)  Worsening of back pain with radiation. I have asked her to call Dr. [name] today for OV and management. She is in agreement.   Orders: Est. Patient Level II ([address])  Appended Document: 4 MONTH F/U OV/LAW FAX NOTE TO [facility], & [facility].  Appended Document: 4 MONTH F/U OV/LAW FAXED

## 2010-10-13 NOTE — Progress Notes (Signed)
Summary: Refill   Phone Note Call from Patient   Caller: Patient Reason for Call: Talk to Nurse Summary of Call: pt needs BIDIL called to [facility] instead of [facility]/tg Initial call taken by: Raechel Ache [facility],  May 17, 2010 1:17 PM  Follow-up for Phone Call        Pt. is due to see Dr. Daleen Squibb and has not scheduled an appt. [facility] requires filling a 90 day supply on meds. LMOM.  Follow-up by: Larita Fife Via LPN,  May 17, 2010 2:04 PM  Additional Follow-up for Phone Call Additional follow up Details #1::        Pt. scheduled appt. for 07-04-10 @ 4:00 with Dr. Daleen Squibb. Additional Follow-up by: Larita Fife Via LPN,  May 18, 2010 1:30 PM    Prescriptions: BIDIL 20-37.5 MG TABS (ISOSORB DINITRATE-HYDRALAZINE) take 1 tab three times a day  #270 x 0   Entered by:   Larita Fife Via LPN   Authorized by:   Gaylord Shih, MD, [facility]   Signed by:   Larita Fife Via LPN on 47/82/9562   Method used:   Electronically to        [facility]* (retail)             ,          Ph: [phone]       Fax: [phone]   RxID:   [id number]

## 2010-10-13 NOTE — Letter (Signed)
Summary: Recall Office Visit  [facility]  [address]   [place], [place] [address]   Phone: [phone]  Fax: [phone]      July 28, 2010   [name] [address] [place], [place]  [address] 1966-07-09   Dear [name],   According to our records, it is time for you to schedule a follow-up office visit with [place].   At your convenience, please call [phone] to schedule an office visit. If you have any questions, concerns, or feel that this letter is in error, we would appreciate your call.   Sincerely,    Diana Eves  [facility] Ph: [phone]   Fax: [phone]

## 2010-10-13 NOTE — Letter (Signed)
Summary:  Results Engineer, agricultural at [facility]  [address] [place] [address]   Phone: [phone]  Fax: [phone]      September 22, 2009 MRN: [id number]   [facility] [address] [place], [place]  [address]   Dear Ms. Sarff,  Your test ordered by Selena Batten has been reviewed by your physician (or physician assistant) and was found to be normal or stable. Your physician (or physician assistant) felt no changes were needed at this time.  __X__ Echocardiogram  ____ Cardiac Stress Test  ____ Lab Work  ____ Peripheral vascular study of arms, legs or neck  ____ CT scan or X-ray  ____ Lung or Breathing test  ____ Other:  No change in medical treatment at this time, per Dr. Daleen Squibb.  Thank you, Ysenia Filice Allyne Gee RN    Vista Bing, MD, [facility].C.Gaylord Shih, MD, F.A.C.C [name], MD, F.A.C.C Nona Dell, MD, F.A.C.C Charlton Haws, MD, [facility].C.C

## 2010-10-13 NOTE — Letter (Signed)
Summary: Remote Device Check  [facility], Main Office  [address]   [place], [place] [address]   Phone: [phone]  Fax: [phone]     November 05, 2009 MRN: [id number]   [name] [address] [place], [place]  [address]   Dear Ms. Shaft,   Your remote transmission was recieved and reviewed by your physician.  All diagnostics were within normal limits for you.    ___X___Your next office visit is scheduled for:  MAY 2011 WITH DR [name]. Please call our [place] office [phone] to schedule an appointment.    Sincerely,  Proofreader

## 2010-10-13 NOTE — Progress Notes (Signed)
Summary: phone note/abn labs  Phone Note Other Incoming   Caller: [name] from [facility] Summary of Call: Pt came in for pre-op labs today. TCS scheduled next Thurs with Dr. Darrick Penna. Hemoglobin 8.5, Hematocrit 27.5 and potassium 3.2. Please advise!     Initial call taken by: Cloria Spring LPN,  May 20, 2010 4:17 PM     Appended Document: phone note/abn labs Per Dr. Jena Gauss, need Creatinine. Called, per Selena Batten in Endo, creatinine is 0.76. Per Dr. Jena Gauss call pt and tell her to take Potassium Chloride 20 meg one daily for 7 days. LMOM for pt to call.  Appended Document: phone note/abn labs Called pt back, left the message that she needs potassium and it was called to [facility].  Appended Document: phone note/abn labs [facility] to call and confirm she received message and got Potassium.  Appended Document: phone note/abn labs PT SAID SHE IS ALREADY ON POTASSIUM 20 MEQ FOUR TIMES DAILY AND WANTS TO KNOW SHOULD SHE INCREASE, PLEASE ADVISE!  Appended Document: phone note/abn labs yes; increase to 100 meq today , tomorrow and Wednesday only  Appended Document: phone note/abn labs Pt informed.  Appended Document: phone note/abn labs Pt called. Having cramping and nausea with the prep. Still seeing iron in in [facility]. Recommeded pt hold off on prep for 2-3 hours and then start again. TCS/EGD SEP 15.

## 2010-10-13 NOTE — Cardiovascular Report (Signed)
Summary: Office Visit Remote   Office Visit Remote   Imported By: Roderic Ovens 06/20/2010 11:12:24  _____________________________________________________________________  External Attachment:    Type:   Image     Comment:   External Document

## 2010-10-13 NOTE — Cardiovascular Report (Signed)
Summary: Office Visit Remote   Office Visit Remote   Imported By: Roderic Ovens 11/08/2009 11:06:33  _____________________________________________________________________  External Attachment:    Type:   Image     Comment:   External Document

## 2010-10-14 ENCOUNTER — Encounter (HOSPITAL_COMMUNITY): Payer: Medicare Other | Attending: Oncology

## 2010-10-14 ENCOUNTER — Other Ambulatory Visit (HOSPITAL_COMMUNITY): Payer: Self-pay | Admitting: Oncology

## 2010-10-14 ENCOUNTER — Other Ambulatory Visit (HOSPITAL_COMMUNITY): Payer: Medicare Other

## 2010-10-14 DIAGNOSIS — D649 Anemia, unspecified: Secondary | ICD-10-CM

## 2010-10-14 DIAGNOSIS — D509 Iron deficiency anemia, unspecified: Secondary | ICD-10-CM | POA: Insufficient documentation

## 2010-10-14 LAB — CBC
MCH: 27.1 pg (ref 26.0–34.0)
MCV: 86.3 fL (ref 78.0–100.0)
Platelets: 343 10*3/uL (ref 150–400)
RBC: 3.87 MIL/uL (ref 3.87–5.11)

## 2010-10-27 NOTE — H&P (Signed)
[name], [name]               ACCOUNT NO.:  [id number]  MEDICAL RECORD NO.:  [id number]          PATIENT TYPE:  OBV  LOCATION:  A308                          FACILITY:  [facility]  PHYSICIAN:  Melvyn Novas, MDDATE OF BIRTH:  [birth year]  DATE OF ADMISSION:  04/28/2010 DATE OF DISCHARGE:  08/19/2011LH                             HISTORY & PHYSICAL   The patient is a 45 year old moderately obese white female, patient of Dr. [name], who has a history of idiopathic cardiomyopathy, nonischemic; iron deficiency anemia status post pacemaker with AICD; hypertension; GERD; sleep apnea who apparently fell a week while carrying some children.  She slid down, had no true syncopal episode. She denied anginal equivalents or palpitations but he did have dizziness and was subsequently seen in the ER, found to have a low hemoglobin of 7.4.  She had an upper and lower and capsule study by GI according to the old chart which was unrevealing for site of bleed.  She has severe iron deficiency anemia and there is question of compliance with iron pills.  She will have 2 units of packed cells transfused to ameliorate symptoms and then with anemia profile prior to transfusion, her stools were heme negative.  She denies angina.  PAST MEDICAL HISTORY:  Significant for the aforementioned nonischemic idiopathic cardiomyopathy, irritable bowel syndrome, peripheral neuropathy, acid reflux, obstructive sleep apnea, GERD, and hypertension.  CURRENT MEDICATIONS: 1. Flexeril 5 mg nightly. 2. BiDil unknown dosage daily. 3. Coreg 12.5 p.o. b.i.d. 4. Torsemide 50 units b.i.d. 5. Neurontin 300 mg p.o. nightly.  Her BNP was less than 30, D-dimer was negative.  PHYSICAL EXAMINATION:  VITAL SIGNS:  Blood pressure was 108/64, pulse is 90 and regular, she is afebrile, respiratory rate is 18. HEENT:  Eyes, PERRLA, Extraocular movements intact.  Sclerae clear. Conjunctivae pink. NECK:  No JVD.  No carotid  bruits.  No thyromegaly.  No thyroid bruits. LUNGS:  Clear to A and P.  No rales, wheeze or rhonchi appreciable. HEART:  Regular rhythm, 1/6 aortic outflow murmur.  No heaves, thrills or rubs appreciable. ABDOMEN:  Obese, soft, nontender.  Bowel sounds are normoactive.  No guarding, rebound, mass or megaly. EXTREMITIES:  Trace to 1+ pedal edema. NEUROLOGIC:  Cranial nerves II through XII are grossly intact.  The patient moves all 4 extremities.  Plantars are downgoing.  IMPRESSION: 1. Near syncopal episode, multifactorial in origin. 2. Severe iron deficiency anemia, possible noncompliance with iron     supplement. 3. Nonischemic cardiomyopathy. 4. Status post pacemaker and automatic implantable cardioverter-     defibrillator in place. 5. Obesity. 6. Hypokalemia which will be repleted. 7. Obstructive sleep apnea. 8. Status post colonic polypectomy several years ago.  PLAN:  At present, continue clear liquid diet.  Serial hemoglobin and hematocrit, monitor cardiac status on telemetry, cardiac enzymes, iron studies and anemia profile and serial stools for occult blood.  She will be seen by Dr. [name] in the a.m.     Melvyn Novas, MD     RMD/MEDQ  D:  10/12/2010  T:  10/13/2010  Job:  [id number]  Electronically Signed by Oval Linsey MD  on 10/27/2010 03:35:29 PM

## 2010-11-10 ENCOUNTER — Encounter (HOSPITAL_COMMUNITY): Payer: Medicare Other | Attending: Oncology

## 2010-11-10 ENCOUNTER — Other Ambulatory Visit (HOSPITAL_COMMUNITY): Payer: Medicare Other

## 2010-11-10 DIAGNOSIS — D509 Iron deficiency anemia, unspecified: Secondary | ICD-10-CM | POA: Insufficient documentation

## 2010-11-10 DIAGNOSIS — D649 Anemia, unspecified: Secondary | ICD-10-CM

## 2010-11-14 ENCOUNTER — Ambulatory Visit (HOSPITAL_COMMUNITY): Payer: Medicare Other | Admitting: Oncology

## 2010-11-16 ENCOUNTER — Ambulatory Visit (HOSPITAL_COMMUNITY): Payer: Medicare Other

## 2010-11-16 DIAGNOSIS — D509 Iron deficiency anemia, unspecified: Secondary | ICD-10-CM

## 2010-11-18 ENCOUNTER — Encounter: Payer: Self-pay | Admitting: Cardiology

## 2010-11-18 ENCOUNTER — Ambulatory Visit: Payer: Self-pay | Admitting: Cardiology

## 2010-11-18 ENCOUNTER — Ambulatory Visit (INDEPENDENT_AMBULATORY_CARE_PROVIDER_SITE_OTHER): Payer: Medicare Other | Admitting: Cardiology

## 2010-11-18 DIAGNOSIS — I5022 Chronic systolic (congestive) heart failure: Secondary | ICD-10-CM

## 2010-11-21 LAB — FERRITIN
Ferritin: 74 ng/mL (ref 10–291)
Ferritin: 11 ng/mL (ref 10–291)

## 2010-11-21 LAB — CBC
HCT: 33.2 % — ABNORMAL LOW (ref 36.0–46.0)
Hemoglobin: 10.2 g/dL — ABNORMAL LOW (ref 12.0–15.0)
MCH: 26.8 pg (ref 26.0–34.0)
MCHC: 30.7 g/dL (ref 30.0–36.0)

## 2010-11-22 LAB — SEDIMENTATION RATE: Sed Rate: 72 mm/hr — ABNORMAL HIGH (ref 0–22)

## 2010-11-22 LAB — HEMOGLOBINOPATHY EVALUATION
Hemoglobin Other: 0 % (ref 0.0–0.0)
Hgb A: 97.9 % — ABNORMAL HIGH (ref 96.8–97.8)

## 2010-11-22 LAB — CBC
MCH: 25.5 pg — ABNORMAL LOW (ref 26.0–34.0)
MCHC: 31.2 g/dL (ref 30.0–36.0)
Platelets: 366 10*3/uL (ref 150–400)
RBC: 3.49 MIL/uL — ABNORMAL LOW (ref 3.87–5.11)
RDW: 18.8 % — ABNORMAL HIGH (ref 11.5–15.5)
WBC: 5.6 10*3/uL (ref 4.0–10.5)

## 2010-11-22 LAB — RETICULOCYTES
RBC.: 3.49 MIL/uL — ABNORMAL LOW (ref 3.87–5.11)
RBC.: 3.96 MIL/uL (ref 3.87–5.11)
Retic Count, Absolute: 45.4 10*3/uL (ref 19.0–186.0)
Retic Count, Absolute: 55.4 10*3/uL (ref 19.0–186.0)

## 2010-11-22 LAB — FERRITIN: Ferritin: 22 ng/mL (ref 10–291)

## 2010-11-22 NOTE — Assessment & Plan Note (Signed)
Summary: PT APPT AT 1:00 NOT 4:15/[id number] per pt ckout 07/04/10 tmj/ths  Medications Added TORSEMIDE 20 MG TABS (TORSEMIDE) take 2 tablets by mouth once daily      Allergies Added: NKDA  Visit Type:  Follow-up Primary Provider:  Dr.Knowlton  CC:  sob.  History of Present Illness: [name] comes in for close followup of her NICM and CSCHF. Her weight is down an additional 2 lbs, now 10 total over the past year. She recently had her Torsemide increased to 40mg  q day by primary care. She has had very little edema.  No defib discharges. Generator change out in April.  Current Medications (verified): 1)  Torsemide 20 Mg Tabs (Torsemide) .... Take 2 Tablets By Mouth Once Daily 2)  Coreg Cr 80 Mg Xr24h-Cap (Carvedilol Phosphate) .... Take 1 Tablet By Mouth Once A Day 3)  Omeprazole 40 Mg Cpdr (Omeprazole) .... Take 1 Tab Daily 4)  Bidil 20-37.5 Mg Tabs (Isosorb Dinitrate-Hydralazine) .... Take 1 Tab Three Times A Day 5)  Spironolactone 25 Mg Tabs (Spironolactone) .... Take 1/2 Tab Daily 6)  Benazepril Hcl 40 Mg Tabs (Benazepril Hcl) .... Take 1 Tab Daily 7)  Gabapentin 300 Mg Caps (Gabapentin) .... Take 1 Cap Am 2 Caps Pm 8)  K-Lor 20 Meq Pack (Potassium Chloride) .... Qid 9)  Prenatabs Fa  Tabs (Prenatal Vit-Fe Fumarate-Fa) .... Take 1 Tab Daily 10)  Cyclobenzaprine Hcl 10 Mg Tabs (Cyclobenzaprine Hcl) .... Take 1 Tab Three Times A Day As Needed 11)  Ventolin Hfa 108 (90 Base) Mcg/act Aers (Albuterol Sulfate) .... Use As Needed  Allergies (verified): No Known Drug Allergies  Comments:  Nurse/Medical Assistant: patient and i reviewed meds no list no meds [facility]  Past History:  Past Medical History: Last updated: 08/15/2010 Chronic systolic congestive heart failure Nonischemic cardiomyopathy Moderate to severe mitral regurgitation status post CRT-D implantationApril 2007 Normal coronary arteries by cardiac catheterization October 2006 Left bundle branch  block Hypertension Asthma Irritable bowel syndrome with primarily constipation Morbid obesity GERD IDA with w/u as outlined in HPI on 12.5.11,  Past Surgical History: Last updated: 05/04/2010 Appendectomy Cholecystectomy: biliary dyskinesia Left partial mastectomy 2003 Left knee arthroscopy 2005 CRT-D. implantation April 2007  Family History: Last updated: 05/04/2010 no premature CAD No FH of Colon Cancer or polyps  Social History: Last updated: 05/04/2010 nonsmoker Nondrinker Diasability for heart disease. Was a CNA. Single: 1 daughter, age 32 Alcohol Use - no Regular Exercise - no Drug Use - no  Risk Factors: Exercise: no (09/15/2009)  Review of Systems       negative other than HPI  Vital Signs:  Patient profile:   45 year old female Weight:      276 pounds BMI:     44.71 Pulse rate:   87 / minute BP sitting:   98 / 57  (left arm)  Vitals Entered By: Barbara Saa, CNA (November 18, 2010 1:20 PM)  Physical Exam  General:  obese.   Head:  normocephalic and atraumatic Eyes:  PERRLA/EOM intact; conjunctiva and lids normal. Neck:  Neck supple, no JVD. No masses, thyromegaly or abnormal cervical nodes. Lungs:  Clear bilaterally to auscultation and percussion. Heart:  RRR, Nl S1 and S2, no Murmur or bruits. Msk:  decreased ROM.   Pulses:  pulses normal in all 4 extremities Extremities:  No clubbing or cyanosis. Neurologic:  Alert and oriented x 3. Skin:  Intact without lesions or rashes. Psych:  Normal affect.    ICD Specifications Following  MD:  [name], MD     ICD Vendor:  [facility] [employer]     ICD Model Number:  H210     ICD Serial Number:  [id number] ICD DOI:  12/21/2005     ICD Implanting MD:  [name], MD  Lead 1:    Location: RA     DOI: 12/21/2005     Model #: [id number]     Serial #: [id number]     Status: active Lead 2:    Location: RV     DOI: 12/21/2005     Model #: [id number]     Serial #: [id number]     Status: active Lead 3:    Location: LV     DOI:  12/21/2005     Model #: [id number]     Serial #: [id number]     Status: active  Indications::  ICM, CHF   ICD Follow Up ICD Dependent:  No      Episodes Coumadin:  No  Brady Parameters Mode DDD     Lower Rate Limit:  60     Upper Rate Limit 120 PAV 120      Tachy Zones VF:  240     VT:  200     VT1:  175     Impression & Recommendations:  Problem # 1:  SYSTOLIC HEART FAILURE, CHRONIC (ICD-428.22) Assessment Unchanged  Her updated medication list for this problem includes:    Torsemide 20 Mg Tabs (Torsemide) .Marland Kitchen... Take 2 tablets by mouth once daily    Coreg Cr 80 Mg Xr24h-cap (Carvedilol phosphate) .Marland Kitchen... Take 1 tablet by mouth once a day    Spironolactone 25 Mg Tabs (Spironolactone) .Marland Kitchen... Take 1/2 tab daily    Benazepril Hcl 40 Mg Tabs (Benazepril hcl) .Marland Kitchen... Take 1 tab daily  Patient Instructions: 1)  Your physician recommends that you schedule a follow-up appointment in: 3 months 2)  Your physician recommends that you continue on your current medications as directed. Please refer to the Current Medication list given to you today.

## 2010-11-24 LAB — PROTEIN ELECTROPHORESIS, SERUM
Albumin ELP: 44.7 % — ABNORMAL LOW (ref 55.8–66.1)
Alpha-1-Globulin: 10 % — ABNORMAL HIGH (ref 2.9–4.9)
Alpha-2-Globulin: 11.3 % (ref 7.1–11.8)
Beta 2: 5.8 % (ref 3.2–6.5)
Beta Globulin: 7.2 % (ref 4.7–7.2)
Gamma Globulin: 21 % — ABNORMAL HIGH (ref 11.1–18.8)
M-Spike, %: NOT DETECTED g/dL
Total Protein ELP: 8 g/dL (ref 6.0–8.3)

## 2010-11-24 LAB — BASIC METABOLIC PANEL WITH GFR
BUN: 4 mg/dL — ABNORMAL LOW (ref 6–23)
CO2: 30 meq/L (ref 19–32)
Calcium: 8.5 mg/dL (ref 8.4–10.5)
Chloride: 98 meq/L (ref 96–112)
Creatinine, Ser: 0.76 mg/dL (ref 0.4–1.2)
GFR calc non Af Amer: >60 mL/min
Glucose, Bld: 84 mg/dL (ref 70–99)
Potassium: 3.2 meq/L — ABNORMAL LOW (ref 3.5–5.1)
Sodium: 137 meq/L (ref 135–145)

## 2010-11-24 LAB — BASIC METABOLIC PANEL
BUN: 8 mg/dL (ref 6–23)
Calcium: 8.1 mg/dL — ABNORMAL LOW (ref 8.4–10.5)
Chloride: 96 mEq/L (ref 96–112)
Chloride: 98 mEq/L (ref 96–112)
Creatinine, Ser: 0.87 mg/dL (ref 0.4–1.2)
Creatinine, Ser: 0.89 mg/dL (ref 0.4–1.2)
GFR calc Af Amer: >60 mL/min (ref >60)
Sodium: 137 mEq/L (ref 135–145)

## 2010-11-24 LAB — POCT CARDIAC MARKERS
CKMB, poc: <1 ng/mL — ABNORMAL LOW (ref 1.0–8.0)
Myoglobin, poc: 85.8 ng/mL (ref 12–200)
Troponin i, poc: <0.05 ng/mL (ref 0.00–0.09)

## 2010-11-24 LAB — COMPREHENSIVE METABOLIC PANEL
Albumin: 3.8 g/dL (ref 3.5–5.2)
Alkaline Phosphatase: 88 U/L (ref 39–117)
BUN: 4 mg/dL — ABNORMAL LOW (ref 6–23)
Chloride: 95 mEq/L — ABNORMAL LOW (ref 96–112)
Creatinine, Ser: 0.74 mg/dL (ref 0.4–1.2)
GFR calc non Af Amer: >60 mL/min (ref >60)
Glucose, Bld: 82 mg/dL (ref 70–99)
Potassium: 3.4 mEq/L — ABNORMAL LOW (ref 3.5–5.1)
Total Bilirubin: 0.6 mg/dL (ref 0.3–1.2)

## 2010-11-24 LAB — DIFFERENTIAL
Basophils Absolute: 0 10*3/uL (ref 0.0–0.1)
Basophils Absolute: 0 10*3/uL (ref 0.0–0.1)
Basophils Relative: 0 % (ref 0–1)
Basophils Relative: 0 % (ref 0–1)
Eosinophils Absolute: 0.2 10*3/uL (ref 0.0–0.7)
Eosinophils Absolute: 0.1 10*3/uL (ref 0.0–0.7)
Eosinophils Relative: 2 % (ref 0–5)
Eosinophils Relative: 2 % (ref 0–5)
Lymphocytes Relative: 27 % (ref 12–46)
Lymphocytes Relative: 23 % (ref 12–46)
Lymphs Abs: 2.4 10*3/uL (ref 0.7–4.0)
Lymphs Abs: 1.6 10*3/uL (ref 0.7–4.0)
Monocytes Absolute: 0.5 10*3/uL (ref 0.1–1.0)
Monocytes Absolute: 0.4 10*3/uL (ref 0.1–1.0)
Monocytes Relative: 6 % (ref 3–12)
Monocytes Relative: 6 % (ref 3–12)
Neutro Abs: 5.8 10*3/uL (ref 1.7–7.7)
Neutro Abs: 4.9 10*3/uL (ref 1.7–7.7)
Neutrophils Relative %: 65 % (ref 43–77)
Neutrophils Relative %: 69 % (ref 43–77)

## 2010-11-24 LAB — CBC
HCT: 31.7 % — ABNORMAL LOW (ref 36.0–46.0)
HCT: 25.8 % — ABNORMAL LOW (ref 36.0–46.0)
Hemoglobin: 7.8 g/dL — ABNORMAL LOW (ref 12.0–15.0)
MCH: 22.5 pg — ABNORMAL LOW (ref 26.0–34.0)
MCH: 21.5 pg — ABNORMAL LOW (ref 26.0–34.0)
MCHC: 30.1 g/dL (ref 30.0–36.0)
MCV: 73 fL — ABNORMAL LOW (ref 78.0–100.0)
MCV: 71.3 fL — ABNORMAL LOW (ref 78.0–100.0)
Platelets: 466 10*3/uL — ABNORMAL HIGH (ref 150–400)
Platelets: 440 10*3/uL — ABNORMAL HIGH (ref 150–400)
RBC: 4.35 MIL/uL (ref 3.87–5.11)
RBC: 3.62 MIL/uL — ABNORMAL LOW (ref 3.87–5.11)
RDW: 19.1 % — ABNORMAL HIGH (ref 11.5–15.5)
WBC: 7.1 10*3/uL (ref 4.0–10.5)

## 2010-11-24 LAB — CROSSMATCH
ABO/RH(D): A POS
Antibody Screen: NEGATIVE

## 2010-11-24 LAB — ERYTHROPOIETIN: Erythropoietin: 262 m[IU]/mL — ABNORMAL HIGH (ref 2.6–34.0)

## 2010-11-24 LAB — IRON AND TIBC
Iron: 18 ug/dL — ABNORMAL LOW (ref 42–135)
Saturation Ratios: 5 % — ABNORMAL LOW (ref 20–55)
TIBC: 391 ug/dL (ref 250–470)
UIBC: 373 ug/dL

## 2010-11-24 LAB — HEMOGLOBIN AND HEMATOCRIT, BLOOD
HCT: 27.6 % — ABNORMAL LOW (ref 36.0–46.0)
Hemoglobin: 8.5 g/dL — ABNORMAL LOW (ref 12.0–15.0)
Hemoglobin: 9.4 g/dL — ABNORMAL LOW (ref 12.0–15.0)

## 2010-11-24 LAB — POCT I-STAT 4, (NA,K, GLUC, HGB,HCT)
Glucose, Bld: 92 mg/dL (ref 70–99)
HCT: 37 % (ref 36.0–46.0)
Sodium: 140 mEq/L (ref 135–145)

## 2010-11-24 LAB — FERRITIN: Ferritin: 14 ng/mL (ref 10–291)

## 2010-11-24 LAB — BRAIN NATRIURETIC PEPTIDE: Pro B Natriuretic peptide (BNP): <30 pg/mL (ref 0.0–100.0)

## 2010-11-24 LAB — FOLATE: Folate: >20 ng/mL

## 2010-11-24 LAB — PATHOLOGIST SMEAR REVIEW

## 2010-11-29 LAB — COMPREHENSIVE METABOLIC PANEL
ALT: 10 U/L (ref 0–35)
Calcium: 8.5 mg/dL (ref 8.4–10.5)
Creatinine, Ser: 1.16 mg/dL (ref 0.4–1.2)
GFR calc Af Amer: >60 mL/min (ref >60)
Glucose, Bld: 103 mg/dL — ABNORMAL HIGH (ref 70–99)
Sodium: 139 mEq/L (ref 135–145)
Total Protein: 7.4 g/dL (ref 6.0–8.3)

## 2010-11-29 LAB — VITAMIN B12: Vitamin B-12: 471 pg/mL (ref 211–911)

## 2010-11-29 LAB — CROSSMATCH

## 2010-11-29 LAB — DIFFERENTIAL
Basophils Relative: 3 % — ABNORMAL HIGH (ref 0–1)
Eosinophils Absolute: 0.1 10*3/uL (ref 0.0–0.7)
Eosinophils Absolute: 0.1 10*3/uL (ref 0.0–0.7)
Eosinophils Relative: 1 % (ref 0–5)
Lymphocytes Relative: 13 % (ref 12–46)
Lymphocytes Relative: 28 % (ref 12–46)
Lymphs Abs: 1.7 10*3/uL (ref 0.7–4.0)
Monocytes Relative: 5 % (ref 3–12)
Neutrophils Relative %: 63 % (ref 43–77)
Neutrophils Relative %: 81 % — ABNORMAL HIGH (ref 43–77)

## 2010-11-29 LAB — URINALYSIS, ROUTINE W REFLEX MICROSCOPIC
Bilirubin Urine: NEGATIVE
Glucose, UA: NEGATIVE mg/dL
Hgb urine dipstick: NEGATIVE
Ketones, ur: NEGATIVE mg/dL
Leukocytes, UA: NEGATIVE
Specific Gravity, Urine: 1.015 (ref 1.005–1.030)
Specific Gravity, Urine: >1.03 — ABNORMAL HIGH (ref 1.005–1.030)
Urobilinogen, UA: 0.2 mg/dL (ref 0.0–1.0)
pH: 5.5 (ref 5.0–8.0)
pH: 6.5 (ref 5.0–8.0)

## 2010-11-29 LAB — URINE MICROSCOPIC-ADD ON

## 2010-11-29 LAB — HEMOGLOBIN AND HEMATOCRIT, BLOOD
HCT: 24.5 % — ABNORMAL LOW (ref 36.0–46.0)
HCT: 26.9 % — ABNORMAL LOW (ref 36.0–46.0)
HCT: 28.7 % — ABNORMAL LOW (ref 36.0–46.0)
Hemoglobin: 7.5 g/dL — ABNORMAL LOW (ref 12.0–15.0)
Hemoglobin: 8.7 g/dL — ABNORMAL LOW (ref 12.0–15.0)
Hemoglobin: 9.2 g/dL — ABNORMAL LOW (ref 12.0–15.0)

## 2010-11-29 LAB — CARDIAC PANEL(CRET KIN+CKTOT+MB+TROPI)
CK, MB: 0.4 ng/mL (ref 0.3–4.0)
CK, MB: 0.8 ng/mL (ref 0.3–4.0)
Relative Index: INVALID (ref 0.0–2.5)
Troponin I: 0.02 ng/mL (ref 0.00–0.06)
Troponin I: 0.02 ng/mL (ref 0.00–0.06)

## 2010-11-29 LAB — CBC
Hemoglobin: 11.2 g/dL — ABNORMAL LOW (ref 12.0–15.0)
MCHC: 32 g/dL (ref 30.0–36.0)
RBC: 3.5 MIL/uL — ABNORMAL LOW (ref 3.87–5.11)
RDW: 23.8 % — ABNORMAL HIGH (ref 11.5–15.5)
WBC: 7.4 10*3/uL (ref 4.0–10.5)

## 2010-11-29 LAB — PREPARE RBC (CROSSMATCH)

## 2010-11-29 LAB — IRON AND TIBC
Saturation Ratios: 5 % — ABNORMAL LOW (ref 20–55)
TIBC: 389 ug/dL (ref 250–470)

## 2010-11-29 LAB — BASIC METABOLIC PANEL
Calcium: 8.2 mg/dL — ABNORMAL LOW (ref 8.4–10.5)
Creatinine, Ser: 1.11 mg/dL (ref 0.4–1.2)
GFR calc Af Amer: >60 mL/min (ref >60)

## 2010-11-29 LAB — POCT CARDIAC MARKERS: Troponin i, poc: <0.05 ng/mL (ref 0.00–0.09)

## 2010-11-29 LAB — LACTIC ACID, PLASMA: Lactic Acid, Venous: 1.4 mmol/L (ref 0.5–2.2)

## 2010-12-01 ENCOUNTER — Ambulatory Visit (HOSPITAL_COMMUNITY): Payer: Medicare Other

## 2010-12-01 DIAGNOSIS — D509 Iron deficiency anemia, unspecified: Secondary | ICD-10-CM

## 2010-12-08 ENCOUNTER — Ambulatory Visit (INDEPENDENT_AMBULATORY_CARE_PROVIDER_SITE_OTHER): Payer: Medicare Other | Admitting: *Deleted

## 2010-12-08 DIAGNOSIS — I5022 Chronic systolic (congestive) heart failure: Secondary | ICD-10-CM

## 2010-12-08 DIAGNOSIS — I428 Other cardiomyopathies: Secondary | ICD-10-CM

## 2010-12-08 NOTE — Progress Notes (Signed)
Remote icd check  

## 2010-12-15 LAB — POCT CARDIAC MARKERS
CKMB, poc: <1 ng/mL — ABNORMAL LOW (ref 1.0–8.0)
Myoglobin, poc: 101 ng/mL (ref 12–200)
Troponin i, poc: <0.05 ng/mL (ref 0.00–0.09)

## 2010-12-15 LAB — CROSSMATCH: ABO/RH(D): A POS

## 2010-12-15 LAB — CBC
HCT: 26.8 % — ABNORMAL LOW (ref 36.0–46.0)
HCT: 30.3 % — ABNORMAL LOW (ref 36.0–46.0)
Hemoglobin: 7.5 g/dL — CL (ref 12.0–15.0)
MCHC: 29.6 g/dL — ABNORMAL LOW (ref 30.0–36.0)
MCHC: 31 g/dL (ref 30.0–36.0)
MCV: 63.3 fL — ABNORMAL LOW (ref 78.0–100.0)
MCV: 67.4 fL — ABNORMAL LOW (ref 78.0–100.0)
MCV: 70.6 fL — ABNORMAL LOW (ref 78.0–100.0)
Platelets: 355 10*3/uL (ref 150–400)
Platelets: 396 10*3/uL (ref 150–400)
RBC: 4.22 MIL/uL (ref 3.87–5.11)
RDW: 21.2 % — ABNORMAL HIGH (ref 11.5–15.5)
RDW: 24.9 % — ABNORMAL HIGH (ref 11.5–15.5)
RDW: 27.4 % — ABNORMAL HIGH (ref 11.5–15.5)
WBC: 7.3 10*3/uL (ref 4.0–10.5)
WBC: 7.6 10*3/uL (ref 4.0–10.5)

## 2010-12-15 LAB — BASIC METABOLIC PANEL
BUN: 5 mg/dL — ABNORMAL LOW (ref 6–23)
BUN: 7 mg/dL (ref 6–23)
Calcium: 8.3 mg/dL — ABNORMAL LOW (ref 8.4–10.5)
Chloride: 102 mEq/L (ref 96–112)
Creatinine, Ser: 0.76 mg/dL (ref 0.4–1.2)
Creatinine, Ser: 0.88 mg/dL (ref 0.4–1.2)
GFR calc Af Amer: >60 mL/min (ref >60)
GFR calc non Af Amer: >60 mL/min (ref >60)
GFR calc non Af Amer: >60 mL/min (ref >60)
Glucose, Bld: 113 mg/dL — ABNORMAL HIGH (ref 70–99)
Glucose, Bld: 89 mg/dL (ref 70–99)
Potassium: 3.5 mEq/L (ref 3.5–5.1)
Potassium: 3.6 mEq/L (ref 3.5–5.1)

## 2010-12-15 LAB — DIFFERENTIAL
Band Neutrophils: 0 % (ref 0–10)
Basophils Absolute: 0 10*3/uL (ref 0.0–0.1)
Basophils Absolute: 0 10*3/uL (ref 0.0–0.1)
Basophils Relative: 0 % (ref 0–1)
Basophils Relative: 0 % (ref 0–1)
Basophils Relative: 0 % (ref 0–1)
Blasts: 0 %
Eosinophils Absolute: 0.1 10*3/uL (ref 0.0–0.7)
Eosinophils Relative: 2 % (ref 0–5)
Eosinophils Relative: 2 % (ref 0–5)
Lymphocytes Relative: 21 % (ref 12–46)
Lymphs Abs: 1.5 10*3/uL (ref 0.7–4.0)
Lymphs Abs: 2.1 10*3/uL (ref 0.7–4.0)
Lymphs Abs: 2.1 10*3/uL (ref 0.7–4.0)
Monocytes Absolute: 0.4 10*3/uL (ref 0.1–1.0)
Monocytes Relative: 4 % (ref 3–12)
Myelocytes: 0 %
Neutro Abs: 5.1 10*3/uL (ref 1.7–7.7)
Neutrophils Relative %: 67 % (ref 43–77)
Neutrophils Relative %: 76 % (ref 43–77)
Promyelocytes Absolute: 0 %

## 2010-12-15 LAB — COMPREHENSIVE METABOLIC PANEL
ALT: 10 U/L (ref 0–35)
AST: 13 U/L (ref 0–37)
Albumin: 3.4 g/dL — ABNORMAL LOW (ref 3.5–5.2)
CO2: 33 mEq/L — ABNORMAL HIGH (ref 19–32)
Chloride: 101 mEq/L (ref 96–112)
GFR calc Af Amer: >60 mL/min (ref >60)
GFR calc non Af Amer: 53 mL/min — ABNORMAL LOW (ref >60)
Potassium: 3.3 mEq/L — ABNORMAL LOW (ref 3.5–5.1)
Sodium: 139 mEq/L (ref 135–145)
Total Bilirubin: 0.3 mg/dL (ref 0.3–1.2)

## 2010-12-15 LAB — POCT I-STAT, CHEM 8
BUN: 8 mg/dL (ref 6–23)
Chloride: 101 mEq/L (ref 96–112)
Creatinine, Ser: 1.2 mg/dL (ref 0.4–1.2)
Glucose, Bld: 108 mg/dL — ABNORMAL HIGH (ref 70–99)
Potassium: 3.3 mEq/L — ABNORMAL LOW (ref 3.5–5.1)
Sodium: 138 mEq/L (ref 135–145)

## 2010-12-15 LAB — VITAMIN B12: Vitamin B-12: 689 pg/mL (ref 211–911)

## 2010-12-15 LAB — IRON AND TIBC
Saturation Ratios: 4 % — ABNORMAL LOW (ref 20–55)
TIBC: 399 ug/dL (ref 250–470)
UIBC: 384 ug/dL

## 2010-12-15 LAB — PREPARE RBC (CROSSMATCH)

## 2010-12-15 LAB — ABO/RH: ABO/RH(D): A POS

## 2010-12-15 LAB — RETICULOCYTES: Retic Ct Pct: 1.5 % (ref 0.4–3.1)

## 2010-12-22 ENCOUNTER — Encounter (HOSPITAL_COMMUNITY): Payer: Medicare Other | Attending: Oncology

## 2010-12-22 ENCOUNTER — Encounter (HOSPITAL_COMMUNITY): Payer: Medicare Other

## 2010-12-22 DIAGNOSIS — D509 Iron deficiency anemia, unspecified: Secondary | ICD-10-CM | POA: Insufficient documentation

## 2010-12-22 DIAGNOSIS — D649 Anemia, unspecified: Secondary | ICD-10-CM

## 2010-12-27 LAB — DIFFERENTIAL
Basophils Absolute: 0 10*3/uL (ref 0.0–0.1)
Basophils Absolute: 0.1 10*3/uL (ref 0.0–0.1)
Basophils Relative: 0 % (ref 0–1)
Basophils Relative: 2 % — ABNORMAL HIGH (ref 0–1)
Eosinophils Absolute: 0.1 10*3/uL (ref 0.0–0.7)
Eosinophils Absolute: 0.2 10*3/uL (ref 0.0–0.7)
Eosinophils Relative: 2 % (ref 0–5)
Lymphs Abs: 1.7 10*3/uL (ref 0.7–4.0)
Monocytes Absolute: 0.4 10*3/uL (ref 0.1–1.0)
Monocytes Relative: 6 % (ref 3–12)
Neutro Abs: 3.6 10*3/uL (ref 1.7–7.7)
Neutrophils Relative %: 54 % (ref 43–77)
Neutrophils Relative %: 66 % (ref 43–77)

## 2010-12-27 LAB — CBC
Hemoglobin: 10.3 g/dL — ABNORMAL LOW (ref 12.0–15.0)
Hemoglobin: 10.5 g/dL — ABNORMAL LOW (ref 12.0–15.0)
MCHC: 32.8 g/dL (ref 30.0–36.0)
MCHC: 33.2 g/dL (ref 30.0–36.0)
MCV: 87.3 fL (ref 78.0–100.0)
MCV: 87.3 fL (ref 78.0–100.0)
RBC: 3.57 MIL/uL — ABNORMAL LOW (ref 3.87–5.11)
RBC: 3.68 MIL/uL — ABNORMAL LOW (ref 3.87–5.11)
RDW: 14.7 % (ref 11.5–15.5)
WBC: 7.1 10*3/uL (ref 4.0–10.5)

## 2010-12-27 LAB — BASIC METABOLIC PANEL
CO2: 32 mEq/L (ref 19–32)
Calcium: 8.4 mg/dL (ref 8.4–10.5)
Chloride: 100 mEq/L (ref 96–112)
Chloride: 104 mEq/L (ref 96–112)
Creatinine, Ser: 0.86 mg/dL (ref 0.4–1.2)
GFR calc Af Amer: >60 mL/min (ref >60)
GFR calc Af Amer: >60 mL/min (ref >60)
GFR calc non Af Amer: >60 mL/min (ref >60)
Glucose, Bld: 121 mg/dL — ABNORMAL HIGH (ref 70–99)
Potassium: 3.4 mEq/L — ABNORMAL LOW (ref 3.5–5.1)
Sodium: 137 mEq/L (ref 135–145)

## 2010-12-27 LAB — COMPREHENSIVE METABOLIC PANEL
ALT: 10 U/L (ref 0–35)
CO2: 33 mEq/L — ABNORMAL HIGH (ref 19–32)
Calcium: 8.4 mg/dL (ref 8.4–10.5)
Chloride: 94 mEq/L — ABNORMAL LOW (ref 96–112)
GFR calc non Af Amer: 58 mL/min — ABNORMAL LOW (ref >60)
Glucose, Bld: 155 mg/dL — ABNORMAL HIGH (ref 70–99)
Sodium: 138 mEq/L (ref 135–145)
Total Bilirubin: 0.3 mg/dL (ref 0.3–1.2)

## 2010-12-27 LAB — URINALYSIS, ROUTINE W REFLEX MICROSCOPIC
Glucose, UA: NEGATIVE mg/dL
Glucose, UA: NEGATIVE mg/dL
Ketones, ur: NEGATIVE mg/dL
Nitrite: NEGATIVE
Nitrite: NEGATIVE
Protein, ur: NEGATIVE mg/dL
Specific Gravity, Urine: 1.025 (ref 1.005–1.030)
Urobilinogen, UA: 0.2 mg/dL (ref 0.0–1.0)
pH: 6 (ref 5.0–8.0)

## 2010-12-27 LAB — POCT I-STAT, CHEM 8
Calcium, Ion: 0.98 mmol/L — ABNORMAL LOW (ref 1.12–1.32)
Hemoglobin: 11.6 g/dL — ABNORMAL LOW (ref 12.0–15.0)
Sodium: 139 mEq/L (ref 135–145)
TCO2: 33 mmol/L (ref 0–100)

## 2010-12-27 LAB — URINE CULTURE: Special Requests: NEGATIVE

## 2010-12-27 LAB — POCT CARDIAC MARKERS: Myoglobin, poc: 115 ng/mL (ref 12–200)

## 2010-12-27 LAB — BRAIN NATRIURETIC PEPTIDE: Pro B Natriuretic peptide (BNP): <30 pg/mL (ref 0.0–100.0)

## 2011-01-08 ENCOUNTER — Emergency Department (HOSPITAL_COMMUNITY)
Admission: EM | Admit: 2011-01-08 | Discharge: 2011-01-09 | Disposition: A | Discharge status: Home / Self Care | Payer: Medicare Other | Attending: Emergency Medicine | Admitting: Emergency Medicine

## 2011-01-08 DIAGNOSIS — K589 Irritable bowel syndrome without diarrhea: Secondary | ICD-10-CM | POA: Insufficient documentation

## 2011-01-08 DIAGNOSIS — Z79899 Other long term (current) drug therapy: Secondary | ICD-10-CM | POA: Insufficient documentation

## 2011-01-08 DIAGNOSIS — I509 Heart failure, unspecified: Secondary | ICD-10-CM | POA: Insufficient documentation

## 2011-01-08 DIAGNOSIS — K219 Gastro-esophageal reflux disease without esophagitis: Secondary | ICD-10-CM | POA: Insufficient documentation

## 2011-01-08 DIAGNOSIS — J45909 Unspecified asthma, uncomplicated: Secondary | ICD-10-CM | POA: Insufficient documentation

## 2011-01-08 DIAGNOSIS — B9689 Other specified bacterial agents as the cause of diseases classified elsewhere: Secondary | ICD-10-CM | POA: Insufficient documentation

## 2011-01-08 DIAGNOSIS — A499 Bacterial infection, unspecified: Secondary | ICD-10-CM | POA: Insufficient documentation

## 2011-01-08 DIAGNOSIS — Z95810 Presence of automatic (implantable) cardiac defibrillator: Secondary | ICD-10-CM | POA: Insufficient documentation

## 2011-01-08 DIAGNOSIS — I2510 Atherosclerotic heart disease of native coronary artery without angina pectoris: Secondary | ICD-10-CM | POA: Insufficient documentation

## 2011-01-08 DIAGNOSIS — N760 Acute vaginitis: Secondary | ICD-10-CM | POA: Insufficient documentation

## 2011-01-08 DIAGNOSIS — G4730 Sleep apnea, unspecified: Secondary | ICD-10-CM | POA: Insufficient documentation

## 2011-01-08 DIAGNOSIS — G589 Mononeuropathy, unspecified: Secondary | ICD-10-CM | POA: Insufficient documentation

## 2011-01-09 LAB — URINALYSIS, ROUTINE W REFLEX MICROSCOPIC
Bilirubin Urine: NEGATIVE
Nitrite: NEGATIVE
Specific Gravity, Urine: 1.01 (ref 1.005–1.030)
Urobilinogen, UA: 1 mg/dL (ref 0.0–1.0)
pH: 7.5 (ref 5.0–8.0)

## 2011-01-09 LAB — WET PREP, GENITAL: Trich, Wet Prep: NONE SEEN

## 2011-01-09 LAB — URINE MICROSCOPIC-ADD ON

## 2011-01-10 ENCOUNTER — Encounter: Payer: Self-pay | Admitting: *Deleted

## 2011-01-10 LAB — GC/CHLAMYDIA PROBE AMP, GENITAL
Chlamydia, DNA Probe: NEGATIVE
GC Probe Amp, Genital: NEGATIVE

## 2011-01-24 NOTE — Assessment & Plan Note (Signed)
[facility]                       [place] [facility] OFFICE NOTE   [name], [name]                      MRN:          [id number]  DATE:05/22/2008                            DOB:          02-14-1966    HISTORY OF PRESENT ILLNESS:  [name] returns today for close followup  of her nonischemic cardiomyopathy and acute on chronic systolic heart  failure.  Please refer to my note on April 15, 2008.   At that time, we increased her Lasix to 40 b.i.d.  She was not able to  get this medicine until May 16, 2008 nearly a month later.  Since  that time, she has noticed increased urinary output and less swelling.  Her biggest complaint is abdominal distention when she is really volume  overloaded.  She has to take lactulose several times a week because of  obstipation.   She denies any orthopnea or PND.   PHYSICAL EXAMINATION:  VITAL SIGNS:  Today her blood pressure is 128/80,  her pulse 78 and regular.  Weight is down 3 pounds to 287 from 290.  She  still probably 5-7 pounds overweight with fluid.  GENERAL:  She is in no acute distress.  Respiratory rate is 18.  NECK:  I could not assess JVD.  LUNGS:  Clear.  HEART:  No S3 or gallop.  ABDOMEN:  Obese with good bowel sounds.  Organomegaly cannot be  assessed.  EXTREMITIES:  Reveal trace to 1+ pitting edema.  Pulses are intact.   ASSESSMENT:  [name] is doing better now that she is taking her Lasix  twice a day.  I have asked her continue with this and I have asked her  to return next week for a chemistry.  We will see her back in the office  in 4 weeks.     Thomas C. Daleen Squibb, MD, [facility]  Electronically Signed    TCW/MedQ  DD: 05/22/2008  DT: 05/23/2008  Job #: [id number]

## 2011-01-24 NOTE — Group Therapy Note (Signed)
NAMESAYLER, MICKIEWICZ               ACCOUNT NO.:  [id number]   MEDICAL RECORD NO.:  [id number]          PATIENT TYPE:  INP   LOCATION:  IC08                          FACILITY:  [facility]   PHYSICIAN:  Edward L. Juanetta Gosling, M.D.DATE OF BIRTH:  November 23, 1965   DATE OF PROCEDURE:  DATE OF DISCHARGE:                                 PROGRESS NOTE   Patient of Dr. [name].   [name] says that she feels much better.  She has no new complaints.  She is less short of breath.  She has not been coughing as much, but she  is still coughing up some sputum.   Her physical examination shows that she is awake and alert.  Her pulse  is about 111 and blood pressure 113/81.  Her respirations are about 20.  Her pupils are reactive.  Her chest is much clearer, but she still has  some rales bilaterally, a little more on the right than on the left.  Her heart is regular.  I do not hear a gallop, and her abdomen is soft.   Her sputum culture is acceptable, but we do not have any results from,  and her blood cultures are negative so far.   ASSESSMENT:  I think she is better.  She did have what appeared to be  some respiratory failure by blood gas.  She has a nonischemic  cardiomyopathy with ejection fraction down in the 20s.  She has an  automatic implantable cardioverter-defibrillator in place, and I think  at this point, we are going to continue treatments for the flu.  Continue with a respiratory isolation.  Continue her antibiotics.  Awaiting culture and follow.      Edward L. Juanetta Gosling, M.D.  Electronically Signed     ELH/MEDQ  D:  02/15/2008  T:  02/15/2008  Job:  [id number]

## 2011-01-24 NOTE — Consult Note (Signed)
[name], [name]               ACCOUNT NO.:  [id number]   MEDICAL RECORD NO.:  [id number]          PATIENT TYPE:  INP   LOCATION:  A314                          FACILITY:  [facility]   PHYSICIAN:  Jonelle Sidle, MD DATE OF BIRTH:  [birth year]   DATE OF CONSULTATION:  DATE OF DISCHARGE:                                 CONSULTATION   PRIMARY CARE PHYSICIAN:  Mila Homer. [name], MD   CARDIOLOGIST:  Jesse Sans. Wall, MD, [facility]   PRIMARY ELECTROPHYSIOLOGY:  Doylene Canning. [name], MD   REASON FOR CONSULTATION:  Syncope.   HISTORY OF PRESENT ILLNESS:  [name] is a 45 year old female patient  with a history of chronic systolic congestive heart failure secondary to  nonischemic cardiomyopathy with an EF of 25%.  She is status post  CRT/AICD implantation.  She developed fevers, chills, and cough about 3-  4 days ago.  She tells me that her temperature was as high as 102  degrees Fahrenheit.  She also developed nausea and vomiting, but no  diarrhea.  She also developed some lower extremity back pain, which she  has had in the past.  She took Flexeril, which she takes infrequently.  This was yesterday.  Yesterday, she did go from 1 room into the kitchen  and when she sat down on the kitchen noted that her vision had turned  yellow and she started to feel a spinning sensation that she was  consistent with her prior history of vertigo.  She then proceeded to get  up from the chair to go to another room.  She woke up on the floor and  had apparently passed out.  She denies any palpitations or antecedent  chest pain.  She denies any ICD discharge.  She denies any shortness of  breath associated with this.  She has noted increased dyspnea on  exertion with her URI symptoms.  She describes NYHA class III symptoms.  She denies orthopnea or PND.  She has occasional mild pedal edema.  She  came [facility] emergency room [facility].  Her blood pressure is 80/43  and her potassium is 2.9.  She received  potassium supplementation and IV  fluids.  We are now asked to assist with management of her medications  for her cardiomyopathy.   PAST MEDICAL HISTORY:  As outlined above.  1. She also has a history of hypertension.  2. Normal coronaries by catheterization up in 2006.  3. Left bundle-branch block.  4. IBS with constipation.  5. Asthma.  6. Status post appendectomy.  7. Status post cholecystectomy.  8. Obesity.  9. Echocardiogram on September 2007, EF 25%, moderate-to-severe mitral      regurgitation, and moderate left atrial enlargement.   MEDICATIONS AT HOME:  1. Benazepril 40 mg daily.  2. Omeprazole 20 mg a day.  3. Torsemide 100 mg daily.  4. BiDil 3 times a day.  5. Coreg CR 80 mg daily.  6. Prenatal vitamins.  7. B12.  8. Potassium 20 mEq b.i.d.  9. Spironolactone 25 mg half-tablet daily.  10.Tylenol p.r.n.  11.Dramamine p.r.n.  12.Cetirizine  p.r.n.   SOCIAL HISTORY:  The patient lives in [place].  She denies tobacco or  alcohol use.   FAMILY HISTORY:  Insignificant for premature CAD.   REVIEW OF SYSTEMS:  See HPI.  She notes some sore throat.  She also  notes cough with yellow sputum production.  She denies hemoptysis.  She  denies dysuria, hematuria, bright red blood rectum, or melena.  Rest of  the review of systems are negative.   PHYSICAL EXAMINATION:  GENERAL:  She is a well-nourished and well-  developed female in no distress.  VITAL SIGNS:  Blood pressure is 96/49, pulse 72, respirations 18,  temperature 98.2, and oxygen saturation 98% on room air.  Weight is  126.2 kg.  HEENT:  Normal.  NECK:  Without JVD.  LYMPH:  Without lymphadenopathy.  ENDOCRINE:  Without thyromegaly.  CARDIAC:  Normal S1 and S2.  Regular rate and rhythm without murmur.  No  S3.  LUNGS:  Clear to auscultation bilaterally.  No wheezing, rhonchi, or  rales.  SKIN:  Without rash.  ABDOMEN:  Soft and nontender with no active bowel sounds.  No  organomegaly.  EXTREMITIES:   Without clubbing, cyanosis, or edema.  MUSCULOSKELETAL:  Without joint deformity.  NEUROLOGIC:  She is alert and oriented x3.  Cranial nerves II through  XII are grossly intact.  VASCULAR:  Without carotid bruits.   Chest x-ray, no acute disease.  EKG, sinus rhythm with a heart rate of  80, V-paced rhythm.   LABORATORY DATA:  White count 6800, hemoglobin 10.3, and platelet count  369,000.  Potassium 3.5, creatinine 0.86, and glucose 121.  Point of  care markers negative x1 and magnesium 1.8.   ASSESSMENT:  1. Syncope in the setting of hypotension likely secondary to      dehydration from recent upper respiratory tract      infection/fever/nausea and vomiting.  2. Chronic systolic congestive heart failure secondary to nonischemic      cardiomyopathy with an ejection fraction of 25%.  3. Status post cardiac resynchronization therapy/automatic implantable      cardioverter-defibrillator.  4. Obesity.   RECOMMENDATIONS:  The patient was also examined by Dr. Nona Dell.  She presents with syncope.  This is likely secondary to hypotension from  dehydration in the setting of her URI symptoms.  She did not have any  obvious ICD therapies.  She recently had a stable device interrogation  through telephonic check.  She denies any palpitations, chest pain, or  ICD therapies.  Her troponins have been negative thus far.  Telemetry is  stable.  Paced rhythm.  At this point, we agree with gentle hydration  with IV fluids.  Orthostatic bowel sounds will be checked.  Her  cardiomyopathy medications are currently on hold with relative  hypotension.  These will need to be restarted as her blood pressure  allows.  We will follow her on telemetry, but at this point in time we  doubt she has had any dysrhythmia.  Her potassium will be replete  further.  Her IV fluids will be cut back to saline lock after several  hours of hydration.   Thank you very much for the consultation.  We will be glad to  follow the  patient throughout the remainder of this admission.      Tereso Newcomer, PA-C      Jonelle Sidle, MD  Electronically Signed    SW/MEDQ  D:  11/05/2008  T:  11/06/2008  Job:  [id number]  cc:   Mila Homer. [name], M.D.  Fax: [phone]

## 2011-01-24 NOTE — Consult Note (Signed)
[name], [name]               ACCOUNT NO.:  [id number]   MEDICAL RECORD NO.:  [id number]          PATIENT TYPE:  INP   LOCATION:  IC08                          FACILITY:  [facility]   PHYSICIAN:  Edward L. Juanetta Gosling, M.D.DATE OF BIRTH:  12-Jun-1966   DATE OF CONSULTATION:  DATE OF DISCHARGE:                                 CONSULTATION   REASON FOR CONSULTATION:  Probable pneumonia.   HISTORY:  [name] is a 45 year old who has about a 6-day history of  sore throat, cough, fever that she says was as high as 102 and diffuse  myalgias.  She saw Dr. [name], her primary physician, a couple of days  ago and was started on antibiotics, ibuprofen, etc., but it did not  help, so she has been brought into the hospital now.  She has had a  cough, bringing up yellow sputum.  She has had some hemoptysis.  She has  had some pleurisy.  She has had fever.  She has had malaise.  She has  had myalgias.  She has a previous history of ischemic cardiomyopathy  with an ejection fraction of around 25%.  She has chronic systolic  congestive heart failure.  She has had a left bundle branch block, has  mitral regurgitation.  She has had hypertension and asthma.  Has a  history of an appendectomy and cholecystectomy.   SOCIAL HISTORY:  She does not use any alcohol.  She smokes 4 or 5  cigarettes a day.  She does not have any use of illicit drugs.   Her medications are Coreg CR 80 mg daily, BiDil 1 t.i.d., Lasix 40 mg  daily, benazepril 40 mg daily, spironolactone 12.5 mg daily, B12 daily,  prenatal vitamins daily, Prilosec 20 mg daily, Flexeril 10 mg t.i.d.  She uses CPAP at home; she does have sleep apnea.  Cymbalta 60 mg daily.  She also has a Facilities manager in place.   Her review of systems except as mentioned is negative.   PHYSICAL EXAMINATION:  GENERAL:  She is awake and alert.  She looks  fairly comfortable.  VITAL SIGNS:  Blood pressure 126/80, respirations about 30 and shallow.  Her  pulse is 96.  She has not had her temperature done here in the  hospital yet.  She is an obese female who is in no obvious distress but  does have some increased work of breathing.  HEENT:  Her pupils are reactive.  Nose and throat are clear.  NECK:  Supple.  Her neck shows no lymphadenopathy.  No definite jugular  venous distention.  HEART:  Regular.  I do not hear a definite murmur.  I do not hear a  gallop.  CHEST:  Shows decreased breath sounds, some minimal wheeze.  No rhonchi.  ABDOMEN:  Soft, obese without masses.  EXTREMITIES:  Showed trace edema.  NEUROLOGIC:  She is intact.   Chest x-ray shows bibasilar atelectasis, cardiomegaly, central venous  pulmonary congestion.  Her D-dimer is 0.32.  Blood gas shows a pO2 of  93, pCO2 of 52, pH 7.38, suggesting chronic respiratory failure.  CBC was  not done.   The plan then would be to go ahead and get a titer of the H1N1 flu,  start her on vancomycin because post-influenza pneumonia has been  recently seen more frequently, MRSA.  I am going to go ahead and get  blood cultures x2.  CBC if not done, does not appear to be in the  computer.  She needs to be on respiratory isolation at least until we  know about her flu situation.      Edward L. Juanetta Gosling, M.D.  Electronically Signed     ELH/MEDQ  D:  02/14/2008  T:  02/14/2008  Job:  [id number]

## 2011-01-24 NOTE — Discharge Summary (Signed)
[name], [name]               ACCOUNT NO.:  [id number]   MEDICAL RECORD NO.:  [id number]          PATIENT TYPE:  INP   LOCATION:  A314                          FACILITY:  [facility]   PHYSICIAN:  Mila Homer. [name], M.D.DATE OF BIRTH:  [birth year]   DATE OF ADMISSION:  11/04/2008  DATE OF DISCHARGE:  02/26/2010LH                               DISCHARGE SUMMARY   [name] is a 45 year old who was admitted to the hospital with a syncopal  episode.  She had a benign 2-day hospitalization extending from November 05, 2008, to September 05, 2009.  Vital signs remained stable, albeit her  blood pressure was low initially.   Admission white cell count of 7100, hemoglobin 10.5, platelet count 366  thousand.  Differential was normal.  CMP showed a potassium 2.9,  chloride 94, bicarb 33, glucose 155.  She had a good BUN of 77,  creatinine 1.04.  Her albumin was 3.1.  I-stat showed hemoglobin 11.6,  potassium 2.9, chloride 95, glucose 1.53.  Cardiac markers were normal.  UA was negative and repeat white cell count on second day of 6800,  hemoglobin 10.3, and BNP at that time showed potassium 3.5, glucose 121,  magnesium was 1.8, and a repeat UA was negative, and the urine culture  showed no growth at 1 day.  Followup BNP on her last and second day,  potassium 3.4, glucose 174, and BNP was less than 30.   A portable chest x-ray showed a pacemaker with AICD in place and  cardiomegaly persisting.  There is no active disease.   In the hospital, she was kept off all medications initially, but put on  Pantoprazole 40 mg daily, prenatal vitamins daily, and given KCl bolus  of 40 mEq and then 20 mEq b.i.d.  When the symptoms of fever and  abdominal pain were mentioned then I asked for a repeat urine with C&S  and put her on Cipro 500 mg b.i.d.   I asked the [facility] to see her given her history of  nonischemic cardiomyopathy and her ejection fraction of 20%.  They  agreed with keeping her off  all antihypertensive medications and her  loop diuretic, and in fact by her second day, she was much improved  really back to normal.  Blood pressure was stable at 129/55 from 101/61  and 96/49 on admission.  It was a feeling of Cardiology that a lot of  her symptoms had to do with avascular depletion.   Based on this, we felt she is ready for discharge home.  I discussed  this personally with Dr. Juanito Doom with the patient in the room.  It was  decided to continue her on her benazepril 40 mg daily and her Coreg CR  80 mg daily, but to hold the spironolactone and the torsemide.  She is  to continue her potassium of chloride 20 mEq b.i.d., omeprazole 20 mg  daily, vitamin B12 500 mcg daily, prenatal vitamins daily, Xopenex  inhaler q.6 h. for breathing, Zyrtec 10 mg as needed for allergies,  acetaminophen p.r.n., but she is to hold her BiDil.  She is to check her blood pressure at least every 4 hours and try to  maintain a blood pressure of over 100/70.  If the BP dropped below this,  she would need to hold her benazepril and Coreg.  Follow up was to be  with me in 3 days, at which time, we decide whether to start  reintroducing the torsemide and the spironolactone.  As Dr. Earlene Plater is  feeling the spironolactone would be quite valuable to reintroduce and we  would wait to see whether she really need the BiDil or not.   FINAL DISCHARGE DIAGNOSES:  1. Syncopal episode probably secondary to intravascular depletion.  2. Dehydration.  3. Hypokalemia.  4. Severe nonischemic cardiomyopathy (ejection fraction of about 20%).  5. Chronic systolic congestive heart failure  6. Moderate-to-severe mitral regurgitation.  7. Morbid obesity.  8. Benign essential hypertension.  9. Severe low back pain possibly secondary to a urinary tract      infection.   At the time we see her again, we will not only check her blood pressure  and weight, but we will be checking her urine culture from the  hospital.  Meanwhile, she is to weigh herself daily.  If there is any problem, she  is to call the hospital operator to get in touch with Dr. Ouida Sills, on-call  for our group this weekend.      Mila Homer. [name], M.D.  Electronically Signed     SDK/MEDQ  D:  11/06/2008  T:  11/07/2008  Job:  [id number]

## 2011-01-24 NOTE — H&P (Signed)
NAMEJOELL, BUERGER               ACCOUNT NO.:  [id number]   MEDICAL RECORD NO.:  [id number]          PATIENT TYPE:  INP   LOCATION:  A314                          FACILITY:  [facility]   PHYSICIAN:  Mila Homer. [name], M.D.DATE OF BIRTH:  July 25, 1966   DATE OF ADMISSION:  11/04/2008  DATE OF DISCHARGE:  LH                              HISTORY & PHYSICAL   This 45 year old was at home last night sitting in the kitchen when she  said she experienced vertigo with the room spinning around her.  Also  things turned yellow to her sight and she started passing her urine  uncontrollably.  She felt extremely weak, got up to walk to the bedroom  but by the time she got to the bedroom door collapsed and woke up on the  floor.  Subsequently her 68-year-old got a neighbor to come in and call  [phone] and then she was transported to the hospital.  She said she really  has been sick for about 4 or 5 days now.  She has chronic low back pain  but the pain has been a lot worse recently.  She has had fever and  chills.  She has also had symptoms of sinusitis and has been taking  Mucinex for this.   PAST MEDICAL HISTORY:  She has a long and fairly complicated medical  history.  She is followed by Dr. Donnamarie Rossetti, cardiology, for chronic  systolic congestive heart failure which is secondary to nonischemic  cardiomyopathy.  She has an ejection fraction of 25% and also moderate  to severe mitral regurgitation.  Other diagnoses include irritable bowel  syndrome, morbid obesity and hypertension.  She has a combination pacer  defibrillator which has been in place for several years given her low  ejection fraction.   CURRENT MEDICATIONS:  1. Include torsemide 100 mg daily.  2. KCl 20 mEq b.i.d.  3. Omeprazole 20 mg daily.  4. Benazepril 40 mg daily.  5. BiDil t.i.d.  6. Coreg CR 80 mg daily.  7. Vitamin B12 500 mcg daily.  8. Spironolactone 12.5 mg daily.  9. Acetaminophen p.r.n.  10.Prenatal vitamin daily.  11.Zyrtec as needed for allergies.  12.Xopenex inhaler puff every 6 hours for breathing.   Last admission at this hospital was in June of 2009 with congestive  heart failure, a viral syndrome and cardiomyopathy.  She is followed by  Dr. Dietrich Pates every 3 months.   PHYSICAL EXAMINATION:  On my exam this morning she feels much better  after IV fluids.  VITAL SIGNS:  Temperature is 98.2, pulse 72, respiratory rate 18, blood  pressure 96/49.  Vitals in the emergency room showed a blood pressure of  103/56, pulse of 89, respiratory rate of 20.  Her O2 sat was 98%.  GENERAL:  Today she is sitting up in bed.  She is oriented and alert.  She is in no acute distress.  She is well-developed and morbidly obese.  O2 sats have been 98% and 99% overnight on room air.  Speech is normal.  Sentence structure is intact.  HEENT:  Mucous membranes are  normally moist.  SKIN:  Skin turgor is normal.  HEART:  The heart has a regular rhythm, rate of about 80.  The heart  sounds very distant due to obesity.  There may be a diastolic murmur but  difficult to auscultate.  LUNGS:  Show decreased breath sounds throughout but are clear.  There  are no wheezes or rhonchi.  ABDOMEN:  Soft and obese without organomegaly or mass or tenderness.  EXTREMITIES:  There is a trace to 1+ pitting edema of the distal legs.   Her admission white cell count was 6800, hemoglobin 10.3 and her BMP  showed a glucose of 121.  CK-MB was less than 1.  Troponin less than  0.05.  Myoglobin 115.  Admission UA was negative.  Magnesium 1.8.  Chest  x-ray showed cardiomegaly and the pacer defibrillator.   ADMISSION DIAGNOSES:  1. Syncope probably secondary to hypotension and her underlying heart      disease.  2. Severe low back pain possibly secondary to pyelonephritis.  3. Chronic systolic congestive heart failure.  4. Nonischemic cardiomyopathy probably secondary to uncontrolled      hypertension years back.  5. Moderate to severe  mitral regurgitation.  6. Morbid obesity.  7. Benign essential hypertension.   Currently she is on IV fluids 125 mL an hour and feels much better.  Given her tendency to fluid overload rapidly I am cutting this back to  75 mL an hour and am having her cardiologist, Dr. Dietrich Pates, see her  today.  We will check another urine for UA C and S.  Start her on Cipro  500 mg b.i.d. after this.  Right now all of her cardiac meds are on hold  due to her symptoms of hypotension.      Mila Homer. [name], M.D.  Electronically Signed     SDK/MEDQ  D:  11/05/2008  T:  11/05/2008  Job:  [id number]

## 2011-01-24 NOTE — Assessment & Plan Note (Signed)
[name]                         ELECTROPHYSIOLOGY OFFICE NOTE   NAME:[name], [name]                      MRN:          [id number]  DATE:06/05/2007                            DOB:          May 27, 1966    HISTORY OF PRESENT ILLNESS:  [name] returns today for follow up.  She is a very pleasant woman with nonischemic cardiomyopathy and  congestive heart failure with hypertension and morbid obesity.  She  returned today for follow up.  She is status post ICD implantation of  the Guidant contact H-210 implanted.  She returns today for follow up.  She has been in the hospital at least once as she ran out of her  medications.  She had no other specific complaints today.   MEDICATIONS:  1. Potassium 20 b.i.d.  2. Lasix 40 mg daily.  3. Coreg 80 mg daily.  4. Prilosec 20 mg daily.  5. Aldactone 12.5 daily.  6. Benazepril 40 mg daily.  7. BiDil.   PHYSICAL EXAMINATION:  GENERAL:  She is a pleasant, obese, middle-aged  woman in no distress.  VITAL SIGNS:  Blood pressure 141/82, pulse 80 and regular, respirations  16, weight 278 pounds.  NECK:  Clear.  No jugular venous distention.  No thyromegaly.  LUNGS:  Clear bilaterally to auscultation.  No wheezes, rales or  rhonchi.  CARDIOVASCULAR:  Regular rate and rhythm.  Normal S1, S2.  No edema.  ABDOMEN:  Soft, nontender, nondistended.  No organomegaly.   Interrogation of her pacemaker demonstrates a Guidant Contact H-210 with  P and R waves of 1.4 and 20, respectively.  LV wave was 5.8 and  threshold 500 in the atrium and 600 in the ventricle and 1000 in the  left ventricle.  Battery voltage was 3.18 volts.  No intercurrent ICD  therapies.   IMPRESSION:  1. Congestive heart failure.  2. Left bundle branch block.  3. Status post bi-V ICD insertion.   DISCUSSION:  Overall, [name] is stable.  Pacemaker is working  normally.  Plan to see her back in the office in one year.     [name].  Barbara Ridgel, MD  Electronically Signed    GWT/MedQ  DD: 06/05/2007  DT: 06/06/2007  Job #: [id number]   cc:   [name]. Barbara Chance, MD

## 2011-01-24 NOTE — Assessment & Plan Note (Signed)
[facility]                         ELECTROPHYSIOLOGY OFFICE NOTE   [name], [name]                      MRN:          [id number]  DATE:07/22/2008                            DOB:          Feb 07, 1966    [name] returns today for followup.  She is a very pleasant middle-  aged woman with a history of ischemic cardiomyopathy, congestive heart  failure, left bundle-branch block who is status post BiV ICD insertion.  She returns today for followup.  Her heart failure has been well  controlled.  She has had no intercurrent IC therapies.  She has  otherwise been stable.  She did have some problems with fluid retention  which has resolved with torsemide.   MEDICATIONS:  1. Prenatal vitamin.  2. Coreg 80 a day.  3. Prilosec 20 a day.  4. Aldactone 12.5 a day.  5. Benazepril 40 a day.  6. BiDil 1 tablet t.i.d. (? 25 versus 50 mg close) .  7. Torsemide 100 a day.  8. Vitamin B12 1000 mcg daily.   PHYSICAL EXAMINATION:  GENERAL:  She is a pleasant obese middle-aged  woman in no distress.  VITAL SIGNS:  Blood pressure was 110/76, pulse was 91 and regular,  respirations were 18, the weight was 287 pounds.  NECK:  No jugular venous distention.  LUNGS:  Clear bilaterally to auscultation.  No wheezes, rales, or  rhonchi are present.  There is no increased work of breathing.  CARDIOVASCULAR:  Regular rate and rhythm.  Normal S1 and S2.  ABDOMINAL:  Soft, nontender.  EXTREMITIES:  No edema.   Interrogation of the defibrillator demonstrates a Guidant contact, the P-  waves were 1, the R-waves 20, the impedance 535 in the A, 600 in the RV,  955 in the LV, threshold volt of 0.4 in the A, 0.6 to 0.4 in the RV and  0.2 to 0.4 in the LV.  Battery voltage was 2.98 volts.  She was 99% V  paced.   IMPRESSION:  1. Ischemic cardiomyopathy.  2. Congestive heart failure.  3. Obesity.  4. Hypertension.  5. Status post BiV ICD insertion.   DISCUSSION:   Overall, [name] is stable.  Her defibrillator is  working normally.  Her heart failure is well controlled.  I have  encouraged her to increase her activities and maintain a low-salt diet.  Hopefully, we can get her to lose some weight.     Doylene Canning. [name], MD  Electronically Signed    GWT/MedQ  DD: 07/22/2008  DT: 07/22/2008  Job #: [id number]

## 2011-01-24 NOTE — Assessment & Plan Note (Signed)
[facility]                       [place] CARDIOLOGY OFFICE NOTE   [name], [name]                      MRN:          [id number]  DATE:02/28/2008                            DOB:          Mar 26, 1966    IDENTIFICATION:  [name] is a 45 year old woman with nonischemic  cardiomyopathy.  I actually last saw her back in November 2007.   Note, the patient was just hospitalized at [facility] for  severe upper respiratory infection.  She was discharged on February 17, 2008.  While she was in the hospital, she diuresed significantly during the  stay.   Since discharge, she is continuing to feel better, though not back to  baseline.  She finished her last antibiotic last evening.  She said she  is urinating, still she feels she is somewhat swollen, but it is  improved.  At rest, she is okay.  With exertion, she gives out  especially with the heat.  Sputum is clear.  No fevers.   CURRENT MEDICATIONS:  1. Prenatal vitamins.  2. Potassium 20 mEq b.i.d.  3. Lasix 40 mg.  4. Coreg XL 80 mg.  5. Prilosec 20 mg.  6. Birth control, implantable.  7. Aldactone 12.5 mg.  8. Benazepril 40 mg.  9. BiDil t.i.d.  10.Vitamin B12.  11.Cymbalta 60 mg.  12.The patient is also using Xopenex nebs and ran out of inhaler.   PHYSICAL EXAMINATION:  GENERAL:  The patient is in no distress.  VITAL SIGNS:  Blood pressure 140/80, pulse 82, weight 282.  LUNGS:  Relatively clear.  She does have some upper airway wheeze.  CARDIAC:  Regular rate and rhythm.  S1 and S2.  No S3.  ABDOMEN:  Distended.  Obese.  EXTREMITIES:  Trace edema.   IMPRESSION:  1. Cardiomyopathy.  Volume status may be up a little bit.  It is      difficult to tell, but symptom wise she is not back to baseline.  I      would check her BMET and a BMP at her convenience.  Continue on the      regimen she is on.  She is still recovering.  I said to see her      back in clinic in 6 weeks.  2.  Hypertension, fair, again will followup on return.   The patient again will call if her problems worsen; otherwise, follow up  in July 2009.     Pricilla Riffle, MD, [facility]  Electronically Signed    PVR/MedQ  DD: 02/28/2008  DT: 02/29/2008  Job #: [id number]   cc:   Annia Friendly. Loleta Chance, MD

## 2011-01-24 NOTE — H&P (Signed)
NAMEBRODI, NERY               ACCOUNT NO.:  [id number]   MEDICAL RECORD NO.:  [id number]          PATIENT TYPE:  INP   LOCATION:  IC08                          FACILITY:  [facility]   PHYSICIAN:  Mila Homer. [name], M.D.DATE OF BIRTH:  [birth year]   DATE OF ADMISSION:  DATE OF DISCHARGE:  LH                              HISTORY & PHYSICAL   This 45 year old began to be sick about 7 days ago.  This started with a  sore throat and a cough.  She has had aching with this, fever and  chills.  The cough was productive of yellow sputum.  She also had yellow  to clear rhinorrhea.  Further she had a bad taste in her mouth and was  fairly weak; problems sleeping due to coughing and aching.   PAST MEDICAL HISTORY:  She is a long, complicated medical history which  includes:  1. CHF.  2. Cardiomyopathy.  3. Defibrillator use.  4. Morbid obesity.  5. Peripheral neuropathy.  6. Reflux esophagitis.  7. IBS.  8. Hyperplastic colonic polyps.  9. B12 deficiency.  10.Hypertension.  11.Allergic rhinitis.  12.Sleep apnea.  13.History of H. pylori in 2004.  14.Cardiomyopathy.  15.CHF with a 15% ejection fraction and was seen by [facility]      back in April 2007, 2 years ago.  It was felt that  her coronary      arteries were open, and her myopathy was secondary to hypertension.    In the office she was treated for pneumonia with Rocephin 1 gram IM and  started on Levaquin 5 mg daily.  We actually got a chest x-ray. It did  not show pneumonia but did show moderate cardiomegaly and mild pulmonary  vascular congestion and a permanent pacemaker.  Her white cell count at  that time was 6,700 and 71% neutrophils, and met 7 was normal except for  random glucose of 109.   She was seen again today and was significantly worse.  She had been up  all night having problems breathing.   PHYSICAL EXAMINATION:  GENERAL:  At the time of the exam today, she is  sitting in the office.  She had  actually driven herself there.  VITAL SIGNS:  BP was 140/96 with a wide cuff.  Pulse 88, temperature  98.8.  She was able to sit there and give me a good history.  She was  alert and oriented, but she appeared dyspneic even from across the room.  CHEST:  Physical exam showed diffuse expiratory wheezing and some  rhonchi throughout the chest.  HEART:  The heart had a regular rhythm and rate about 90.  ABDOMEN:  Soft without organomegaly or mass.  There was no tenderness.  EXTREMITIES:  She had trace edema of the ankles.   Given the deterioration of her physical condition despite antibiotics,  it was elected to admit her to the hospital.  It was possible even 2  days ago that she had early influenza; however, at 5 days it was too  late to treat her with Tamiflu.  It was felt that she  may have had a  secondary bronchitis, and antibiotics were indicated.   At this point given her multiple medical problems and young age, I have  elected to put her in the ICU.  I have asked Dr. Shaune Pollack,  pulmonologist, to see her.  He and I have already discussed her case,  and he is switching her to vancomycin and also getting the swine flu  titer.  She will be on IV fluids, IV antibiotics, and general supportive  care.  Meanwhile we will continue the medication she has been using at  home which include:  1. Furosemide 40 mg p.o. daily.  2. Benazepril 40 mg daily.  3. Spironolactone 12.5 mg daily.  4. BiDil t.i.d.  5. B12 1000 mcg 2 tablets daily.  6. Prenatal vitamins once daily.  7. Omeprazole 20 mg daily.  8. Cymbalta 60 mg daily.  9. Flexeril 10 mg t.i.d. p.r.n. muscle spasm.  10.Lactulose 5 ml t.i.d. for IBS symptoms.  11.Coreg, which dose she said was 80 mg daily (but we are checking      with cardiology).   Twelve-lead EKG is pending as well as a repeat chest x-ray, CBC, SMA-12,  BNP, D-dimer and ABGs on room air.  In addition to Dr. Juanetta Gosling, I have  asked her cardiologist, [facility], to see her in consult.  She  will be on O2 at 2 liters, Xopenex 1.25 mg by nebulizer every 4 hours  p.r.n. breathing, Tylenol 650 every 4 hours p.r.n. as well as the  vancomycin as above.  She got a dose of furosemide 40 mg IV STAT and was  coming up to the ICU.   The patient has an extremely complicated medical history, and she has  significant chronic medical morbidity.   DIAGNOSES:  1. Pneumonia.  2. Possible swine flu.  3. Congestive heart failure.  4. Cardiomyopathy, probably secondary to hypertension.  5. Defibrillator use and pacemaker use due to significantly decreased      ejection fraction of 15%.  6. Morbid obesity.  7. Peripheral neuropathy.  8. Reflux esophagitis.  9. Irritable bowel syndrome.  10.History of severe hypertension.  11.Allergic rhinitis.  12.Sleep apnea.     Mila Homer. [name], M.D.  Electronically Signed    SDK/MEDQ  D:  02/14/2008  T:  02/14/2008  Job:  [id number]

## 2011-01-24 NOTE — Assessment & Plan Note (Signed)
[facility]                       [place] CARDIOLOGY OFFICE NOTE   LOISE, ESGUERRA                      MRN:          [id number]  DATE:04/15/2008                            DOB:          [birth year]    Ms. Garrow comes in today for further management of non-ischemic  cardiomyopathy and chronic systolic heart failure.   She sees Dr. Eden Emms and Dr. Tenny Craw here in the [facility].   She has recovered now from her hospitalization with severe respiratory  infection and heart failure.  She is slowly getting her strength back.  She has noticed some abdominal distension but denies any nausea,  orthopnea, or PND and she has not really had any peripheral edema.  She  has been taking some extra Lasix 40 mg p.o. b.i.d. as opposed to 40 once  a day.  She is afraid she is going to run out.  We will renew this  today.   She seems to be well versed on weighing herself and adjusting her Lasix.  I have advised her to take an extra potassium when she takes an extra  Lasix as well.   Her meds are well worked out and are noted in previous notes in the  maintenance medication list.  Her biggest complaint today is her  neuropathy.   Her blood pressure today is 120/80 which is excellent for her, her pulse  is 82 and regular, and her weight is 290, up 8 pounds since June.  GENERAL:  She is in no acute distress.  NECK:  Very difficult to assess JVD.  LUNGS:  Clear to auscultation.  HEART:  Regular rate and rhythm.  No gallop.  PMI could not be  appreciated.  ABDOMEN:  Obese with good bowel sounds.  EXTREMITIES:  No pitting edema.  She has just a little touch of edema at  her ankle.  Pulses are intact.  NEUROLOGIC:  Grossly intact.   I had a long talk with [name] today.  As I said, she is on a  excellent medical program, and we will renew her Lasix for 40 b.i.d. so,  she does not run out.  She will weigh herself daily and titrate her dose  accordingly.  She knows to take extra potassium as mentioned above.  We  will plan on seeing her back again in 6 weeks.     Thomas C. Daleen Squibb, MD, [facility]  Electronically Signed    TCW/MedQ  DD: 04/15/2008  DT: 04/16/2008  Job #: [id number]

## 2011-01-24 NOTE — Assessment & Plan Note (Signed)
[facility] HEALTHCARE                       [place] CARDIOLOGY OFFICE NOTE   NAME:[name], Barbara RODRIGES                      MRN:          [id number]  DATE:12/17/2008                            DOB:          1966/05/23    CARDIOLOGIST:  Barbara Sans. Daleen Squibb, MD, [facility]   PRIMARY CARE PHYSICIAN:  Barbara Homer. [name], MD   REASON FOR VISIT:  Post-hospitalization followup.   HISTORY OF PRESENT ILLNESS:  [name] is a very pleasant 45 year old  female patient with a history of chronic systolic congestive heart  failure secondary to nonischemic cardiomyopathy and an EF 25% status  post CRT-D implantation.  We saw her recently at [facility] in  the setting of syncope secondary to dehydration from upper respiratory  tract infection.  Most of her medicines for cardiomyopathy were held.  These were gently restarted at discharge.  She has been doing well since  discharge from the hospital.  She is back on all of her medicines except  she is now taking torsemide 50 mg every other day instead of 700 mg  daily.  She had been doing well with this.  She denies any weight gain.  She denies any significant pedal edema.  She denies any orthopnea or  PND.  She denies any significant shortness of breath.  She denies any  chest pain.  She denies any ICD therapies.  She does note some  lightheadedness from time to time when she takes torsemide.  She notes  this ever since she was started on gabapentin couple of weeks ago for  neuropathy.   CURRENT MEDICATIONS:  Prenatal vitamin daily, Potassium 20 mEq 3 times a  day, Coreg CR 80 mg daily, Prilosec 20 mg daily, Birth control,  Aldactone  12.5 mg daily, Benazepril 40 mg daily,  BiDil 3 times a day, Vitamin B12, Torsemide 50 mg every other day,  Gabapentin 300 mg b.i.d., Xopenex nebulizer.   PHYSICAL EXAMINATION:  GENERAL:  She is a well-nourished, well-developed  female in no acute distress.  VITAL SIGNS:  Blood pressure is  120/84, pulse 80, weight 278 pounds.  This is down several pounds since her last visit November 2009 (9).  HEENT:  Normal.  NECK:  Without JVD.  CARDIAC:  Normal S1 and S2.  Regular rate and rhythm.  LUNGS:  Clear to auscultation bilaterally.  No rales.  ABDOMEN:  Soft, nontender.  EXTREMITIES:  Trace ankle edema bilaterally.  NEUROLOGIC:  She is alert and oriented x3.  Cranial nerves II-XII  grossly intact.  SKIN:  Warm and dry.   ASSESSMENT AND PLAN:  1. Chronic systolic congestive heart failure in the setting of      nonischemic cardiomyopathy and ejection fraction of 25% status post      CRT-D implantation.  The patient is doing well from a      cardiomyopathy standpoint.  She is optivolemic on exam.  She does      have some lightheadedness when she takes torsemide.  She is now on      every other day dosing.  She may require less diuretic.  We will  try to obtain labs done by her primary care physician several weeks      ago.  We will also recheck a BMET today.  If she has signs of      prerenal azotemia, we will likely decrease her torsemide dose even      more.  She has been asked to hold her potassium on the days that      she does not take torsemide.  For this reason, we will also check a      followup BMET in a week.  2. Hypertension, this is well controlled.   DISPOSITION:  She will be brought back in followup with Dr. Daleen [name] in the  next 3 months.  She is to return sooner p.r.n.      Tereso Newcomer, PA-C  Electronically Signed      Barbara Sans. Daleen Squibb, MD, [facility]  Electronically Signed   SW/MedQ  DD: 12/17/2008  DT: 12/18/2008  Job #: [id number]   cc:   [name], M.D.

## 2011-01-24 NOTE — Group Therapy Note (Signed)
NAMEROZA, CREAMER               ACCOUNT NO.:  [id number]   MEDICAL RECORD NO.:  [id number]          PATIENT TYPE:  INP   LOCATION:  A341                          FACILITY:  [facility]   PHYSICIAN:  Edward L. Juanetta Gosling, M.D.DATE OF BIRTH:  Nov 27, 1965   DATE OF PROCEDURE:  DATE OF DISCHARGE:  02/17/2008                                 PROGRESS NOTE   Ms. Britz says she is feeling well and wants to go home.  She is not  short of breath and she has had no more fever.   PHYSICAL EXAMINATION:  This morning shows that she does look  comfortable.  Temperature is 97.9, pulse 88, respirations 20, blood  pressure 132/72, and O2 sat is 97% on room air.  Her chest is clear.  She looks better in general.  Her heart is regular.  I do not hear a  gallop.   Her sputum culture has come back now and has grown Candida albicans.  Her influenza titer is still pending.   ASSESSMENT:  She has gotten a respiratory illness associated with fever.  She has a nonischemic cardiomyopathy with severely reduced ejection  fraction.  She may have the novel influenza, but she seems better.  She  is afebrile and I think she is getting close to maximum hospital  benefits.  I do not plan to change any of her treatments today.  I think  we can stop her vancomycin, etc., and I will go ahead and order that and  put her on some p.o. meds.      Edward L. Juanetta Gosling, M.D.  Electronically Signed     ELH/MEDQ  D:  02/17/2008  T:  02/17/2008  Job:  [id number]

## 2011-01-24 NOTE — Consult Note (Signed)
[name], [name]               ACCOUNT NO.:  [id number]   MEDICAL RECORD NO.:  [id number]          PATIENT TYPE:  INP   LOCATION:  IC08                          FACILITY:  [facility]   PHYSICIAN:  Gerrit Friends. Dietrich Pates, MD, FACCDATE OF BIRTH:  1966-05-06   DATE OF CONSULTATION:  02/14/2008  DATE OF DISCHARGE:                                 CONSULTATION   REFERRING PHYSICIAN:  Mila Homer. [name], M.D.   PRIMARY CARDIOLOGIST:  Pricilla Riffle, MD, [facility]   PRIMARY ELECTROPHYSIOLOGIST:  Doylene Canning. [name], M.D.   REASON FOR CONSULTATION:  Congestive heart failure.   HISTORY OF PRESENT ILLNESS:  [name] is a 45 year old female patient  with a history of nonischemic cardiomyopathy, chronic systolic  congestive heart failure,  status post CRT-D implantation who developed  sore throat, cough and fevers last weekend.  She saw her primary care  physician approximately 2 days ago.  A chest x-ray at that time was  nonacute.  She was prescribed antibiotics, cough medication and Motrin.  She had no improvement in her symptoms over the next couple of days.  She notes a cough productive of yellow sputum and occasional hemoptysis.  She noted pleuritic chest pain, especially with cough.  She noted fevers  with temperature about 101 to 102 as well as myalgias.  She also noted  orthopnea and has slept in a recliner for the last couple of nights.  She noted paroxysmal nocturnal dyspnea and somewhat worsening edema.  She saw her primary care physician in the office today and was admitted  from the office.   PAST MEDICAL HISTORY:  1. Nonischemic cardiomyopathy with an EF of 25%.  2. Chronic systolic congestive heart failure.  3. Status post CRT-D implantation April 2007.  4. Cardiac catheterization with normal coronary arteries October 2006.  5. Moderate-to-severe mitral regurgitation.  6. Left bundle branch block.  7. Hypertension.  8. Asthma.  9. Status post appendectomy.  10.Status post  cholecystectomy.   MEDICATIONS PRIOR TO ADMISSION:  Coreg CR 80 mg daily, BiDil 3 times a  day, Lasix 40 mg daily, benazepril 40 mg daily, spironolactone 12.5 mg  daily, B12 daily prenatal vitamins daily, Prilosec 20 mg daily, Flexeril  10 mg 3 times a day p.r.n., Cymbalta 60 mg daily.   She was placed on Levaquin, Tussionex and Motrin 2 days ago.   ALLERGIES:  No known drug allergies.   SOCIAL HISTORY:  The patient lives in [place] with her 12-year-old  daughter.  She smokes 4 to 5 cigarettes per day.  She denies alcohol  abuse.   FAMILY HISTORY:  Significant for pacemaker implanted in her father, who  is still living.  She denies premature CAD in her family.   REVIEW OF SYSTEMS:  Please see HPI.  She notes occasional sweats.  She  thinks her weight may have gone up slightly recently.  She notes  headaches as well as sore throat.  She denies syncope, near-syncope,  dysuria, hematuria, bright red blood per rectum or melena, nausea,  vomiting or diarrhea or skin changes.  Rest of the review of systems are  negative.   PHYSICAL EXAMINATION:  GENERAL:  She is a well-nourished, well-developed  female with mild-to-moderate increased work of breathing.  VITAL SIGNS:  Blood pressure 126/80, pulse 96, respirations 25 to 30.  HEENT:  Normal.  NECK:  Without appreciable JVD at 90 degrees.  CARDIAC:  Normal S1, S2, regular rate and rhythm.  No appreciable  murmur.  LUNGS:  With decreased breath sounds bilaterally.  Diffuse expiratory  rhonchi noted bilaterally.  LYMPH:  Without lymphadenopathy.  ENDOCRINE:  Without thyromegaly.  VASCULAR:  Exam without carotid bruits bilaterally.  SKIN:  Without rash.  ABDOMEN:  Soft, nontender with normoactive bowel sounds.  EXTREMITIES:  With trace edema bilaterally.  MUSCULOSKELETAL:  Without joint deformity.  NEUROLOGIC:  She is alert and oriented x3.  Cranial nerves II-XII  grossly intact.   Chest x-ray from today reveals bibasilar  atelectasis, cardiomegaly and  central venous pulmonary congestion.  EKG normal sinus rhythm, heart  rate of 93, ventricular paced.   Labs:  Sodium 138, potassium 3.7, BUN 5, creatinine 0.76, glucose 104.  ABG:  7.389/52.3/93.4/30.9/96.9%.   IMPRESSION:  1. Dyspnea.      a.     Rule out pneumonia.      b.     Rule out acute-on-chronic systolic congestive heart failure.  2. Nonischemic cardiomyopathy with an ejection fraction of 20%.  3. Status post CRT-D implantation.  4. Hypertension.  5. Moderate-to-severe mitral regurgitation.  6. Morbid obesity.  7. History of asthma.   PLAN:  The patient presents with dyspnea, likely secondary to her  infectious pulmonary process.  She may have developed a small degree of  volume overload secondary to her pulmonary process.  We agree with  initiation of IV Lasix for now.  BNP will be added to her laboratory  testing.  Her other CHF medications will be continued.  Her beta blocker  be held at this time secondary to her significant wheezing.  Treatment  of her pulmonary infection will be per her primary service.   Thank you very much for the consultation.  We will be glad to follow the  patient throughout the remainder of this admission.      Tereso Newcomer, PA-C      Gerrit Friends. Dietrich Pates, MD, [facility]  Electronically Signed    SW/MEDQ  D:  02/14/2008  T:  02/14/2008  Job:  [id number]

## 2011-01-24 NOTE — Group Therapy Note (Signed)
NAMEELEXUS, BARMAN               ACCOUNT NO.:  [id number]   MEDICAL RECORD NO.:  [id number]          PATIENT TYPE:  INP   LOCATION:  A341                          FACILITY:  [facility]   PHYSICIAN:  Edward L. Juanetta Gosling, M.D.DATE OF BIRTH:  06/28/1966   DATE OF PROCEDURE:  DATE OF DISCHARGE:                                 PROGRESS NOTE   Ms. Vadala seems to be doing much better in general.  She has no new  complaints.  She says she is not coughing much now.  She has no other  new problems.  Her physical examination shows she is awake and alert,  sitting up, looks comfortable.  Her blood pressures in the 120 systolic  range, pulse in the 69G.  She is afebrile.  Her pupils are reactive to  light and accommodation.  Her nose and throat are clear.  Her chest  fairly clear, still some rales more in the right.  Her heart is regular,  looks like it is a paced rhythm.  Her abdomen is soft.   ASSESSMENT:  She has febrile illness, the etiology of this is unknown as  yet.  She is on vancomycin antibiotics.  She has severe nonischemic  cardiomyopathy and she has possible influenza.  We are still awaiting  the influenza titer.  Her white count today 6800, hemoglobin is 12.2,  platelets 259; and her BMET shows her glucose is 111, BUN is 17, and  creatinine 1.84, so she does seem to have some mild chronic renal  failure.      Edward L. Juanetta Gosling, M.D.  Electronically Signed     ELH/MEDQ  D:  02/16/2008  T:  02/16/2008  Job:  [id number]

## 2011-01-24 NOTE — Assessment & Plan Note (Signed)
[name]                       [name] CARDIOLOGY OFFICE NOTE   [name], [name]                      MRN:          [id number]  DATE:03/25/2009                            DOB:          [birth year]    [name] comes in today for her nonischemic cardiomyopathy and chronic  systolic heart failure.   This past week, she had nausea and vomiting for no explained reason.  She has subsequently went to [facility] that evening and developed  dizziness, vertigo, and felt some chest discomfort.   She subsequently had more dizziness.  She also gets lightheaded when she  stands.   She weighs herself daily.  Her weight has been going down.  She does not  feel as tight in her stomach.  Actually looking at our scales, her  weight has dropped 5 pounds since April.   She is currently on torsemide 50 mg every other day with potassium  supplementation.  She probably needs a lower dose.  She is having a hard  time splitting the 100 mg tablets.   Her meds are outlined in the chart and cardiovascular meds include:  1. Coreg CR 80 mg per day.  2. Aldactone 12.5 mg a day.  3. Benazepril 40 mg a day.  4. BiDil 1 t.i.d.  5. Torsemide 50 mg every other day.  6. Potassium supplementation 20 mEq t.i.d. on the day she takes her      torsemide and gabapentin 300 mg 1 in the morning and 2 in the      evening.   PHYSICAL EXAMINATION:  GENERAL:  She is very pleasant and also  knowledgeable about her disease.  She seems very compliant.  VITAL SIGNS:  Her blood pressure is 116/77.  Her pulse is 90 and  regular.  Her weight is 273.  HEENT:  Essentially normal.  NECK:  Supple.  There is no obvious JVD.  Thyroid is not enlarged.  Trachea is midline.  CHEST:  Lungs are clear to auscultation and percussion.  HEART:  A poorly appreciated PMI.  There is no S3 gallop.  ABDOMEN:  Obese with good bowel sounds.  Organomegaly cannot be  adequately assessed.  EXTREMITIES:  No  edema.  Pulses are intact.  NEUROLOGIC:  Intact.  MUSCULOSKELETAL:  Chronic arthritic changes.  SKIN:  Unremarkable.   ASSESSMENT:  I think, [name] is a little bit dry.  We will cut her  torsemide to 40 mg every other day with potassium supplementation.  I  will check her BMET today.   I think, she is savvy enough to weigh herself daily and just her  diuretics down the road.  We will try to make this simpler, as we go  along.  I have made no other changes.  I will see her back again in  several months.     Thomas C. Daleen Squibb, MD, [facility]  Electronically Signed    TCW/MedQ  DD: 03/25/2009  DT: 03/26/2009  Job #: [id number]   cc:   Mila Homer. [name], M.D.

## 2011-01-24 NOTE — Assessment & Plan Note (Signed)
[facility] HEALTHCARE                       [place] CARDIOLOGY OFFICE NOTE   [name], [name]                      MRN:          [id number]  DATE:07/27/2008                            DOB:          1966/08/04    PRIMARY CARE PHYSICIAN:  Barbara Homer. [name], MD   ELECTROPHYSIOLOGIST:  Barbara Canning. [name], MD   REASON FOR VISIT:  Followup.   HISTORY OF PRESENT ILLNESS:  [name] is 45 year old female patient  with a history of nonischemic cardiomyopathy with an EF of 25%, status  post CRT-D implantation in April 2007, who has chronic systolic  congestive heart failure.  She returns to the office today for followup.  She has not been seen in the office since she saw Dr. Daleen [name] on May 22, 2008.  Since that time, she has done well.  She continued to have  problems with fluid retention and her primary care physician switched  her to torsemide.  She has done beautifully since then.  She notes  decreased abdominal girth as well as lower extremity edema.  Her  breathing is improved.  She denies orthopnea or PND.  She notes chronic  dyspnea with exertion.  She describes NYHA class IIB symptoms.  She  denies any chest pain.  She denies any syncope.  She denies any ICD  therapies.   CURRENT MEDICATIONS:  Prenatal vitamin daily, Potassium 20 mEq 3 times a  day, Coreg CR 80 mg daily, Prilosec 20 mg daily, Birth control,  Aldactone 12.5 mg daily,  Benazepril 40 mg daily, BiDil 3 times a day, Vitamin B12, Torsemide 100  mg daily, Flexeril p.r.n., Xopenex p.r.n.   ALLERGIES:  No known drug allergies.   PHYSICAL EXAMINATION:  GENERAL:  She is a well-nourished, well-developed  female in no acute distress.  VITAL SIGNS:  Blood pressure is 106/80, pulse 72, and weight 284 pounds.  This is down 3 pounds, since she saw Dr. Ladona [name] 5 days ago.  HEENT:  Normal.  NECK:  Without JVD.  CARDIAC:  Normal S1 and S2.  Regular rate and rhythm.  LUNGS:  Clear to auscultation  bilaterally.  ABDOMEN:  Soft, nontender.  EXTREMITIES:  With trace edema bilaterally.  NEUROLOGIC:  She is alert and oriented x3.  Cranial nerves II-XII are  grossly intact.   ASSESSMENT AND PLAN:  1. Chronic systolic congestive heart failure secondary to nonischemic      cardiomyopathy with an EF 25% as well as moderate-to-severe mitral      regurgitation.  She is doing well from a symptomatic standpoint.      Her weight is down and her breathing is much improved, since      switching to the torsemide.  Her electrolytes and renal function      are followed by Dr. Sudie [name].  We will try to obtain a copy of her      recent results for our records as well.  2. Irritable bowel syndrome with primarily constipation.  She has had      lactulose in the past.  She did ask for a refill today, but wanted  to know if there is anything else she could take.  I have      recommended over-the-counter stool softeners for her to try.  3. Status post BiV/ICD insertion.  She will follow up with Dr. Ladona [name]      as indicated.  4. Hypertension.  This is overall well-controlled.  No medication      changes were made today.   DISPOSITION:  The patient will follow up with me or Dr. Daleen [name] in 3 months  or sooner p.r.n.      Tereso Newcomer, PA-C  Electronically Signed      Barbara Friends. Dietrich Pates, MD, [facility]  Electronically Signed   SW/MedQ  DD: 07/27/2008  DT: 07/28/2008  Job #: [id number]   cc:   [name], M.D.

## 2011-01-24 NOTE — Group Therapy Note (Signed)
NAMETAHEERA, THOMANN               ACCOUNT NO.:  [id number]   MEDICAL RECORD NO.:  [id number]          PATIENT TYPE:  INP   LOCATION:  IC08                          FACILITY:  [facility]   PHYSICIAN:  Mila Homer. [name], M.D.DATE OF BIRTH:  02-15-1966   DATE OF PROCEDURE:  DATE OF DISCHARGE:                                 PROGRESS NOTE   SUBJECTIVE:  She is really feeling better today.  I admitted her  yesterday and gave her 40 Lasix IV and she diuresed 1500 mL of this.  She is now on Tamiflu b.i.d. and on vancomycin IV.  She had been seen by  Dr. Juanetta Gosling, with whom I have discussed her case at length.   OBJECTIVE:  Temperature 99 degrees, respiratory rate 16, blood pressure  is stable, and she is sitting up in bed on O2.  O2 sat is 96% 2 L nasal  cannula.  She is in no acute distress.  She is well-developed and morbidly obese.  Lungs showed good breath sounds.  No intercostal retraction, no use  accessory muscles of respiration.  Heart sounds are faint, rate around 70.   At this point, the blood cultures x2 were negative and sputum culture  likewise negative as only a day.  The D-dimer was 0.32.  The LFTs shows  an SGOT of 59 and a PT of 36.  BNP is less than 30 and BMP was normal.  Blood gas showed pH 7.38, pCO2 52, and pO2 93.   ASSESSMENT:  1. Viral syndrome - this may represent influenza.  2. Cardiomyopathy secondary to uncontrolled hypertension.  3. Congestive heart failure, well-controlled.  4. Status post defibrillator and pacer implantation.   PLAN:  1. We are waiting for the swine flu titer from the [facility], which may be      ready today.  Continue current treatments since she is markedly      improved.  2. Isolation.  Isolation in the ICU.      Mila Homer. [name], M.D.  Electronically Signed     SDK/MEDQ  D:  02/15/2008  T:  02/15/2008  Job:  [id number]

## 2011-01-24 NOTE — Discharge Summary (Signed)
[name], [name]               ACCOUNT NO.:  [id number]   MEDICAL RECORD NO.:  [id number]          PATIENT TYPE:  INP   LOCATION:  A341                          FACILITY:  [facility]   PHYSICIAN:  Mila Homer. [name], M.D.DATE OF BIRTH:  04-Mar-1966   DATE OF ADMISSION:  02/14/2008  DATE OF DISCHARGE:  06/08/2009LH                               DISCHARGE SUMMARY   This 45 year old was admitted with viral syndrome and CHF.  She had  benign 4-day hospitalization extending from February 14, 2008, to February 17, 2008.  Vital signs remained stable.   Her admission white cell count done through the office was less than  10,000.  D-dimer on admission was 0.32.  BMP showed a glucose 104, BUN  5, and creatinine 0.76.  ABGs on admission on 2 L showed pH 7.38, pCO2  of 52, and pO2 of 93.  LFT showed SGOT mildly elevated at 59, SGPT at  36.  BNP less than 30.  Sputum culture grew just moderate albicans and  Gram stain showed few Gram-positive cocci in clusters and Gram-positive  rods.   Her repeat white cell count on third day was 6800 with a normal diff.  BMP showed a bicarb 35, glucose 111, BUN 7, and creatinine 1.84.  Blood  cultures x2 showed no growth to date.   She had chest x-ray 2 days prior to admission.  X-ray showed mild  pulmonary vascular congestion and a permanent pacemaker, and stable  cardiomegaly.  On admission, she had bibasilar atelectasis and  cardiomegaly and central venous pulmonary congestion, and then a right  PICC line was placed and that was in the mid to lower SVC.   She was admitted to the hospital.  We consulted [facility] due  to her cardiomyopathy, and also Dr. Juanetta Gosling due to this onset of  pulmonary infection.  She was given furosemide 40 mg IV immediately and  then continued on furosemide 4 mg p.o. daily, benazepril 4 mg p.o.  daily, spironolactone 12.5 mg p.o. daily, BiDil t.i.d., B12 1000 mcg 2  tablets daily, prenatal vitamins once a day, omeprazole 20 mg  daily,  Cymbalta 60 mg daily, Flexeril 10 mg t.i.d. p.r.n. muscle spasm, Coreg  dose to be made by her cardiologist, and Xopenex 1.25 mg by nebulizer  q.4 h.   Dr. Juanetta Gosling ordered an H1N1 titer, which is still pending at the time of  discharge.  He was started on Zosyn by pharmacy protocol and Tamiflu 75  mg b.i.d.  She also received vancomycin 2 g IV q.12 h.  She was given  Coreg CR 40 mg daily by her cardiologist.   She did well on this regimen and by the second day, had diuresed 1500  mL, and was really feeling much better.  She was switched to furosemide  40 mg IV daily.  At this point, we stopped her p.o. furosemide.  She had  vancomycin peak and troughs done.   By day #4, she improved to the point that it was felt that she could be  discharged home.  She has already been switched to  Augmentin 875 b.i.d.  and we decided just to go ahead and treat her at home with this.   DISCHARGE MEDICATIONS:  She is discharged home on her admit medications  which are;  1. Coreg CR 40 mg daily.  2. Lasix 40 mg daily.  3. Benazepril 40 mg daily.  4. Spironolactone 12.5 mg daily.  5. B12 1000 mcg 2 daily.  6. Prilosec 20 mg daily.  7. Prenatal vitamins daily.  8. Cymbalta 60 mg daily.  9. Flexeril 10 mg t.i.d.  10.BiDil t.i.d.  11.Augmentin 875 b.i.d. (20, no refills).  12.She is also given Xopenex 1.25 mg ampule and a nebulizer q.4 h for      breathing (months' supply with refills p.r.n.).   FOLLOWUP:  Followup should be in the office within the week.   FINAL DISCHARGE DIAGNOSES:  1. Congestive heart failure, improved.  2. Viral syndrome, possibly influenza H1N1.  3. Cardiomyopathy secondary to hypertension.  4. Hypertension.  5. Status post permanent pacer and defibrillator implant.  6. Obesity.   Again, follow up in the office within a week.  She will take all the  above medications.      Mila Homer. [name], M.D.  Electronically Signed     SDK/MEDQ  D:  02/17/2008  T:   02/17/2008  Job:  [id number]

## 2011-01-24 NOTE — Op Note (Signed)
NAME:  [name], [name]               ACCOUNT NO.:  [id number]   MEDICAL RECORD NO.:  [id number]          PATIENT TYPE:  EMS   LOCATION:  ED                            FACILITY:  [facility]   PHYSICIAN:  R. Roetta Sessions, M.D. DATE OF BIRTH:  Nov 25, 1965   DATE OF PROCEDURE:  05/13/2007  DATE OF DISCHARGE:                               OPERATIVE REPORT   PROCEDURE:  Emergency EGD.   INDICATIONS FOR PROCEDURE:  Patient is a 45 year-old lady with  longstanding gastroesophageal reflux disease, multiple medical problems.  Eating some fried brim fish yesterday afternoon about 5 o'clock,  swallowed an felt a bone get hung in the base of her right neck.  She  had foreign body sensation ever since, although she has been able to eat  and drink fluids.  Her last meal was about 11 o'clock this morning. She  was able to get some pancakes down.  Symptoms persisted.  She came to  the ED she saw Dr. Colon Branch.  Dr. Colon Branch called me.  I came to see her and  we had discussed the risks, benefits, and alternatives of emergency EGD  and the good possibility that the fish bone is already gone but it just  left her with an irritated hypopharyngeal mucosa.  Questions were  answered and is agreeable.  Please see documentation in medical record.   PROCEDURE NOTE:  O2 saturation, blood pressure, pulse and respiration  monitored during the entire time of procedure.  Conscious sedation.  Versed 4 milligrams, IV Demerol 100 milligrams in divided doses.  Cetacaine spray for topical oropharyngeal anesthesia.  Gastroscope  Pentax videoendoscope system.  The mouth piece was gently placed in the  patient's mouth. She had some recent dental work on the right side with  tooth extraction. This area was avoided entirely during the procedure.  The scope was advanced over the tongue and the hypopharyngeal mucosa was  carefully inspected.  I did some excoriated area at the base of the  epiglottis, but I carefully inspected the  hypopharyngeal mucosa all the  way down to the cricopharyngeus and puriform sinus, etc, and I saw no  evidence of foreign body, only some mucosal irritation at the base of  the epiglottis of the right side, suggestive of location or prior  foreign body impaction.  The esophagus was easily intubated.  Examination of the tubular esophagus revealed a noncritical Schatzki's  ring.  There was no esophagitis.  EG junction was easily traversed.  The  remaining stomach, colon, gastric cavity was emptied and insufflated  well with air.  A thorough examination of the gastric mucosa __________.  Retroflexion view of the proximal stomach, esophagogastric junction  demonstrated only a small hiatal hernia. Pylorus is patent, easily  traversed.  Examination of the bulb and second portion revealed no  abnormalities.  Scope was pulled back up into the esophagus and across  the cricopharyngeus very carefully and the hypopharyngeal mucosa was  inspected and there were no additional findings.  The patient tolerated  the procedure well and was reactive in endoscopy.   IMPRESSION:  Excoriated mucosa  at the base of the epiglottis, likely  site of fish bone impaction.  No evidence of persistent foreign body.  Noncritical Schatzki's ring.  Otherwise, normal esophagus.  Small hiatal  hernia, otherwise normal stomach, D1, D2.   RECOMMENDATIONS:  1. Patient was reassured foreign body sensation may persist for a      couple of days.  I have asked her simply to      avoid irritating with very hot or cold foods during this time and      to use some Chloraseptic spray as needed for sore throat.  She is      to continue her Prilosec and other medications.  2. She should do well.      Jonathon Bellows, M.D.  Electronically Signed     RMR/MEDQ  D:  05/13/2007  T:  05/13/2007  Job:  [id number]   cc:   Pricilla Riffle, MD, [facility]  [address]  [address]  [place]  [place] [address]   Nicoletta Dress. Colon Branch, M.D.  Fax:  [phone]   Mila Homer. [name], M.D.  Fax: [phone]

## 2011-01-27 NOTE — Procedures (Signed)
[name], [name]               ACCOUNT NO.:  [id number]   MEDICAL RECORD NO.:  [id number]          PATIENT TYPE:  REC   LOCATION:                                FACILITY:  [facility]   PHYSICIAN:  [place] Bing, M.D. [facility]   DATE OF BIRTH:   DATE OF PROCEDURE:  06/05/2005  DATE OF DISCHARGE:                                  ECHOCARDIOGRAM   REFERRING PHYSICIAN:  Dr. Smith/Dr. [name]   CLINICAL DATA:  A 45 year old woman with chest pain and hypertension.   M-MODE TRACINGS:  Aorta 2.9, left atrium 4.9, septum 1.2, posterior wall  1.4, LV diastole 7.0, LV systole 6.4.   RESULTS:  1.  Technically adequate echocardiographic study.  2.  Mild left atrial enlargement; normal right atrium and right ventricle.  3.  Structurally normal mitral valve with substantial regurgitation that is      not adequately quantified based upon this examination.  Very mild      annular calcification.  4.  Normal trileaflet aortic valve.  5.  Normal tricuspid and pulmonic valve; normal proximal pulmonary artery.  6.  Moderate left ventricular dilatation with borderline hypertrophy; severe      global hypokinesis with an estimated ejection fraction of 15%.  7.  Normal IVC.      [place] Bing, M.D. [facility]  Electronically Signed     RR/MEDQ  D:  06/05/2005  T:  06/06/2005  Job:  [id number]

## 2011-01-27 NOTE — Op Note (Signed)
[name], [name]               ACCOUNT NO.:  [id number]   MEDICAL RECORD NO.:  [id number]          PATIENT TYPE:  INP   LOCATION:  2807                         FACILITY:  [facility]   PHYSICIAN:  Doylene Canning. [name], M.D.  DATE OF BIRTH:  April 09, 1966   DATE OF PROCEDURE:  12/21/2005  DATE OF DISCHARGE:                                 OPERATIVE REPORT   PROCEDURE PERFORMED:  Implantation of a biventricular implantable  cardioverter/defibrillator.   </INTRODUCTION/.  The patient is a very pleasant 45 year old woman with a history of  hypertension, obesity, and longstanding congestive heart failure.  She has  an EF of 15-20% by echo.  Her QRS duration was 110 ms, and subsequent tissue  Doppler echo demonstrated marked tissue Doppler dyssynchrony between the  lateral wall and the septum, and she is now referred for biventricular ICD  implantation.   PROCEDURE:  After informed consent was obtained, the patient was taken to  the diagnostic EP lab in the fasting state.  After the usual preparation and  draping, intravenous fentanyl and midazolam was given for sedation.  Lidocaine 30 cc was infiltrated into the left infraclavicular region.  An 8  cm incision was carried out over this region, and electrocautery utilized to  dissect down the fascial plane.  The left subclavian vein was punctured x3,  and the Guidant model [id number] active fixation defibrillation lead was  advanced to the right ventricle.  The Guidant model [id number] active  fixation pacing lead was advanced to the right atrium.  Mapping was carried  out in the right ventricle, and at the final site on the RV septum, the R  wave was measured at 20 mV.  The pacing impedence was 869 ohm with the  pacing threshold of 0.9 volts, 0.5 ms, once the lead was actively fixed.  A  10 volt pacing did not stimulate the diaphragm.  With the RV lead in  satisfactory position, attention was then turned to placement of the atrial  lead, which  was placed in the right atrial appendage, where P waves measured  3.4 mV, and the pacing impedence was 562 ohm with the pacing threshold of  0.7 V at 0.5 ms.  With both RA and RV leads in satisfactory position,  attention was then turned to placement of the left ventricular lead.  The  coronary sinus Guidant catheter was advanced into the coronary sinus without  difficulty.  Venography of the coronary sinus was carried out, demonstrating  a large high lateral vein, which was selected for LV lead placement.  A  guidewire was advanced into this vein, and then the Guidant model 4543-  [id number] Easy Track 2 LV lead was advanced into the lateral vein, where LV  waves measured 4 mV, the pacing impedence was 1300 ohm, and the pacing  threshold of 0.8 V at 0.5 ms.  The 10 V pacing did not stimulate the  diaphragm.  With all of the above satisfactory parameters, the lead was  secured in the pectoralis fascia with a 0- silk suture.  The__________  was  also secured with a silk suture.  Electrocautery was utilized to make a  subcutaneous pocket.  Kanamycin irrigation was utilized to irrigate the  pocket, and electrocautery utilized to secure hemostasis.  The Guidant  Contact Renewal 3 RF model H210, serial [id number] was connected to the right  atrial, right ventricular, and left ventricular leads and placed in the  subcutaneous pocket.  The generator was secured with a silk suture.  The  pocket was irrigated with Kanamycin, and the incision closed with a layer of  2-0 Vicryl, followed by a layer of 3-0 Vicryl, followed by a layer of 4-0  Vicryl.  Defibrillation threshold testing was then carried out.   After the patient was more deeply sedated with fentanyl and Versed, VF was  induced with a T wave shock, and a 14 joule shock was delivered, which  terminated VF and restored sinus rhythm.  Five minutes was utilized to  collapse, and a second DFT  test carried out.  Again, VF was induced with a  T wave  shock, and a 14 joule shock was delivered, which terminated VF and  restored sinus rhythm.  At this point, no additional defibrillation  threshold testing was carried out, and the incision was closed with a layer  of 2-0 Vicryl, followed by a layer of 3-0 Vicryl, followed by a layer of 4-0  Vicryl.  Benzoin was painted on the skin.  Steri-Strips were applied, and  the pressure dressing was placed.  The patient was returned to her room in  satisfactory condition.   COMPLICATIONS:  There were no immediate procedural complications.   RESULTS:  This demonstrated successful implantation of a Guidant  biventricular ICD in a patient with nonischemic cardiomyopathy, class III  heart failure, EF of 20% with marked tissue dyssynchrony by tissue Doppler  echo.           ______________________________  Doylene Canning. [name], M.D.     GWT/MEDQ  D:  12/21/2005  T:  12/21/2005  Job:  [id number]   cc:   Annia Friendly. Loleta Chance, MD  Fax: [phone]   Vida Roller, M.D.  Fax: [phone]

## 2011-01-27 NOTE — Consult Note (Signed)
[facility]  Patient:    [name], [name] Visit Number: [id number] MRN: [id number]          Service Type: RHE Location: [facility] Attending Physician:  Aundra Dubin Dictated by:   Nathaneil Canary, M.D. Proc. Date: 08/22/01 Admit Date:  08/22/2001                            Consultation Report  Jonell Cluck, M.D.  RE:  [name]  CHIEF COMPLAINT:  After birth of baby, low-back pain, hand tingling, cold, asthma, bronchitis.  Dear Loraine Leriche:  Thank you for allowing me to help in Ms. Carters care.  She is a 45 year old black female who reports that in November 2001, about 2 months after the birth of her baby she began having low-back pain.  There was no trauma or event to precipitate this.  This is a severe pain that she says is sharp and pulling. This will go down the backs of her thighs and into the legs and stops in the mid foot.  It is more on the right side then the left but is still about equal.  Another problem is numbness into her hands and they will feel asleep. She is fallen several times and the last fall was about 1 month ago.  She falls because of the pain.  She has difficulty going up steps.  She feels her feet and legs have been swollen and this occurs more in the afternoon.  She finds a fluid pill helps with this.  She describes having breast cysts that had been drained over the last year.  She has frequent bronchitiss.  In mid October she was having some chills and bronchitis.  She has incontinence which is probably stress incontinence.  She had a MRI of the lumbar spine on April 09, 2001, which showed minimal degenerative changes of the lumbar spine.  She had slight joint space narrowing of L1 through L4.  There was no disk herniation or spinal stenosis. She had a cervical MRI at the same time that showed no evidence of significant herniation, spinal stenosis or nerve root encroachment.  Review of her x-ray jacket shows a head CT  scan of May 08, 2001, that showed no acute intracranial abnormality.  The left ankle and left foot were x-rayed on June 30, 2001, of which both were normal.  She had a mammogram on July 03, 2001.  This was obtained because a mass was found but this did not show signs that were suspicious of cancer.  On further review of systems she says her sleep is poor and she cannot sleep because of pain in the legs and back.  Her energy level is "poor".  She does not have headaches often.  She has had mild problems with alternating diarrhea and constipation.  She is stiff in the mornings for possibly an hour.  She has had fever with some infections recently.  She denies rashes or weight loss. There has been no blood or mucous to the bowel movement.  She has shortness of breath, sometimes, with her asthma but no chest pain.  Further review of systems was negative.  PAST MEDICAL/SURGICAL HISTORY: 1. Hypertension. 2. Episode of tachycardia. 3. Asthma/bronchitis. 4. Appendectomy. 5. 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 6. Ectopic pregnancy 1993/1994. 7. Cholecystectomy 1996.  MEDICATIONS: 1. Norvasc 10 mg q.d. 2. Lasix 20 mg q.d. 3. Metoprolol 50 mg 1/2 q.d. 4. Zyrtec 10 mg  p.r.n. 5. Albuterol inhaler. 6. Nasonex p.r.n.  ALLERGIES:  None.  FAMILY HISTORY:  Her father has hypertension and a cardiac pacemaker.  Her mother died at age 57 with cervical cancer.  SOCIAL HISTORY:  She is here today with a man who is her ex-husband.  She grew up in [place]. and moved to [place], about 10 years ago.  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  She began smoking at age 48, and smokes about 1/2 a pack of cigarettes a day. She rarely drinks alcohol.  PHYSICAL EXAMINATION:  VITAL SIGNS: Weight 268 pounds.  Height 5 feet 6 inches.  Blood pressure 140/98, respirations 16, pulse 74.  GENERAL: She is significantly overweight and is in no  distress.  SKIN:  She has some abdominal surgical scars.  I did not examine the breasts to see if there were cysts.  The skin overall was unremarkable.  HEENT:  PERRL, EOMI.  The right eye was watery and slightly injected.  Mouth clear.  NECK:  Negative JVD.  LUNGS:  Clear.  HEART:  Regular.  ABDOMEN:  Obese, soft, nontender.  MUSCULOSKELETAL:  The hands, wrists, elbows, shoulders, neck, all had no arthritic swelling.  Had a full range of motion and were nontender.  Trigger points at the elbow, shoulder, neck, occiput, anterior chest, and upper paraspinous muscles were mildly tender but there was no wincing.  Palpation through the mid paraspinous muscles and low back was met with complaint of pain but no wincing.  Hips, knees, ankles and feet had a good range of motion, were cool and nontender.  Lower extremities had trace edema.  NEUROLOGIC:  Strength is 5/5, DTRs are 2+ throughout.  She was able to forward flex but missed the floor by 2 to 3 inches.  She had a questionably positive Tinels side to the right hand with complaining of shooting sensation to the index finger.  The left Tinel sign was negative.  She moved fairly easily on the table.  There was some guarding to her back which she complained of with movement.  On the straight leg lift there was back guarding and a complaint of pain but no shooting pain.  Strength is 5/5.  ASSESSMENT AND PLAN: 1. Back pain. Particularly because we had the evidence of the MRI this is not a slipped disk or spinal stenosis.  I believe neurologically she is intact.  I would treat this conservatively.  I am having her go for physical therapy to see if range of motion and movement will help with this.  I have also placed her on ibuprofen 800 mg b.i.d. with food. 2. Insomnia.  We will try her on trazodone 50 to 100 mg h.s. to see if this will help with the sleep. 3. Hypertension. 4. Obesity. 5. Asthma. 6. Continued smoking.  Mark,  overall I find that Ms. Carters exam is fairly normal.  I did note that  she had several tests that showed inflammation with an elevation sedimentation rate and CRP.  I suspect that this goes along with the fact that she runs numerous infections.  Her TSH was normal at 0.899.  I am no seeing any signs of an inflammatory arthritis and doubt that this is lupus.  Hopefully, the conservative therapy and her effort in movement and exercise will help reduce some of the pain.  Also, the trazodone may help with her sleep.  I will see her back in 6 weeks.  Sincerely, Dictated by:   Nathaneil Canary, M.D. Attending Physician:  Aundra Dubin DD:  08/22/01 TD:  08/22/01 Job: [id number]

## 2011-01-27 NOTE — Assessment & Plan Note (Signed)
[facility]                              CARDIOLOGY OFFICE NOTE   NAME:[name], [name]                      MRN:          [id number]  DATE:05/04/2006                            DOB:          [birth year]    IDENTIFICATION:  [name] is a 45 year old woman with a history of  nonischemic cardiomyopathy, status post BiV ICD in April.  She was last seen  in the cardiology clinic back in June by Dr. [name].  At that time,  she appeared to be slightly volume overloaded, and it was recommended that  she increase her Lasix to two tablets, 80 mg, for three days, then back down  to 40.   The patient says over the past few days, she has had increased shortness of  breath, cough productive of clear sputum.  No fevers or chills.  Two days  ago, she increased her Lasix to 80 daily but has noted no real change in her  urine output.  Her weight at home has gone up about 8 pounds from 267 to  275.  She has noted some increased nausea.  This morning, she woke up at  4:00 a.m. with PND, diffusely sweating.  Had some burning in her chest at  the time.  All of these symptoms she has had before when she was volume-  overloaded.   CURRENT MEDICATIONS:  1. Lotrel 5/20 daily.  2. Prenatal vitamin daily.  3. Potassium 20 mEq b.i.d.  4. Lasix 80 mg daily x2 days, previously 40 daily.  5. Coreg CR 80 mg daily.  6. Prilosec 40 daily.  7. Contraceptive implanted.   PHYSICAL EXAMINATION:  GENERAL:  Patient is currently in mild distress.  VITAL SIGNS:  Blood pressure 138/98, pulse 76 and regular.  Weight 273,  actually down on our scales from 280.  O2 sat on room air is 98%.  NECK:  JVP appears to be normal.  LUNGS:  Relatively clear.  CARDIAC:  Regular rate and rhythm.  S1 and S2.  No definite S3.  ABDOMEN:  Mild right upper quadrant tenderness.  EXTREMITIES:  Trace edema.   IMPRESSION:  [name] is a 45 year old woman with a severe left  ventricular  dysfunction who comes in with a two day history of increasing  weight, increasing shortness of breath.  Exam is not that remarkable for  volume overload, but again, she is a bigger woman, can be difficult.  What I  would do today is get a BMET, BNP, and a chest x-ray.  I have tentatively  given her a prescription of Bumex to hold on to.  I will also go up on her  Lotrel to 5/40 daily for better blood pressure control.  I will be in touch  with her once I have seen the blood work test results.  Again, CBC will be  checked as well.  Set followup for one week.   ADDENDUM:  If her symptoms worsen, she should proceed to the emergency room.  Pricilla Riffle, MD, [facility]    PVR/MedQ  DD:  05/04/2006  DT:  05/04/2006  Job #:  [id number]

## 2011-01-27 NOTE — Procedures (Signed)
[name], [name]               ACCOUNT NO.:  [id number]   MEDICAL RECORD NO.:  [id number]          PATIENT TYPE:  EMS   LOCATION:  ED                            FACILITY:  [facility]   PHYSICIAN:  Edward L. Juanetta Gosling, M.D.DATE OF BIRTH:  Dec 01, 1965   DATE OF PROCEDURE:  08/11/2006  DATE OF DISCHARGE:  08/12/2006                              EKG INTERPRETATION   TIME AND DATE OF PROCEDURE:  2342, August 11, 2006.   Rhythm is sinus rhythm with a short PR interval.  I do not see definite  changes of Wolff-Parkinson-White syndrome, but there is some slurring of  the initial part of the QRS complex, and this could indeed be the  situation.  The computers read inferior infarction, age undetermined,  which I think is not seen, and there is slow R wave progression across  the precordium, which could indicate a previous anterior myocardial  infarction, but all of this could also be related to Wolff-Parkinson-  White syndrome, and clinical correlation is suggested.      Edward L. Juanetta Gosling, M.D.  Electronically Signed     ELH/MEDQ  D:  08/12/2006  T:  08/13/2006  Job:  [id number]

## 2011-01-27 NOTE — Assessment & Plan Note (Signed)
[facility]                              CARDIOLOGY OFFICE NOTE   NAME:[name], [name]                      MRN:          [id number]  DATE:06/14/2006                            DOB:          01-28-1966   IDENTIFICATION:  [name] is a 45 year old woman with a history of  nonischemic cardiomyopathy status post BiV pacer in April 2007.  She was  last seen in cardiology clinic back on August 31 by Dr. Dionicio Stall.  I saw  her prior to that earlier in August.  In the interval since being seen, she  has actually had her BiV optimized on September 19.  Since this  optimization, she really has had no significant change in her symptoms.  She  still gives out with a couple of flights of stairs.  She also wakes up  intermittently at night, she has to rip her CPAP off and this has gone on  for awhile, about one time per night.  Overall, she does not sleep well  because of her back pain, lower extremity, neuropathy, and restless leg.  Again, this has not changed.  She denies significant swelling in her feet.   CURRENT MEDICATIONS:  Prenatal vitamin daily, potassium 20 mEq b.i.d., Lasix  40 mg with occasional 80 mg daily, Coreg CR 80 daily, Prilosec 40 daily,  birth control for three years, Aldactone 12.5 daily, Lotrel 10/40 daily.   PHYSICAL EXAMINATION:  GENERAL:  The patient is in no acute distress, denies dizziness.  VITAL SIGNS:  Blood pressure 92/70, pulse 77, weight 274.  NECK:  JVP does not appear to be increased.  LUNGS:  Relatively clear.  CARDIAC:  Regular rate and rhythm, S1 and S2, grade 1/6 systolic murmur.  ABDOMEN:  Benign.  EXTREMITIES:  No significant edema.   IMPRESSION:  1. Cardiomyopathy.  Volume status overall does not look too bad.  The only      change I would make in her regimen is to switch her from Lotrel to      Benazepril and add Biodel to her regimen.  I told her she may not feel      a difference with this but there would be  a benefit down the road for      this.  2. Hypertension.  Again, will need to follow backing off on the Norvasc,      keeping her on Benazepril, and adding Biodel to her regimen.  She will      follow up in three weeks time.   She is due to see Dr. [name] for the first time, previously followed by  Dr. Loleta Chance.  Note, she was seen by Dr. [name] this summer status post  BiV ICD.  Optimized by echo back in September.           ______________________________  Pricilla Riffle, MD, [facility]   PVR/MedQ  DD:  06/14/2006 DT:  06/15/2006 Job #:  [id number]   cc:   Mila Homer. [name], M.D.

## 2011-01-27 NOTE — Assessment & Plan Note (Signed)
[facility]                         [facility] OFFICE NOTE   [name], [name]                      MRN:          [id number]  DATE:05/11/2006                            DOB:          26-Feb-1966    PRIMARY CARE PHYSICIAN:  Previously Dr. Sheffield Slider.  I am not sure who has taken  over as her primary now.   [name] is a lady we follow who has heart failure with a nonischemic  dilated cardiomyopathy.  She is status post biventricular pacer in April,  2007.  She had a heart catheterization last year which showed nonobstructive  coronary disease, ejection fraction of about 25%.  She had a tissue Doppler  study which showed severe dilated cardiomyopathy with dyssynchrony and  moderate-to-severe MR.  The thought was that she would benefit from the  biventricular pacer, but unfortunately, she has had a little challenge with  that.  She has recently been evaluated, and she has even had an AV Synchrony  echocardiogram.  I do not have the results of that, but it occurred some  time in early July.  Anyway, she was seen last week by Dr. Tenny Craw for some  worsening shortness of breath.  She was given a little bit of Bumex.  She  had some laboratories done and a chest x-ray.  She returns today for the  results of those.  She is actually feeling a little bit better.  She is  still mildly short of breath but says that actually it is not as bad as it  was when she saw Dr. Tenny Craw.   CURRENT MEDICATIONS:  1. Lotrel 5/20 once daily.  2. Prenatal vitamins once daily.  3. Potassium 20 mEq twice daily.  4. Lasix 40 mEq twice daily.  5. Coreg 80 mg once daily.  6. Prilosec 40 mg a day.  7. She is on Bumex, I think 1 mg shortly after she takes her Lasix tablet.   PHYSICAL EXAMINATION:  VITAL SIGNS:  She is 273 pounds, which is the same as  she weighed a week ago when Dr. Tenny Craw saw her.  Blood pressure is 110/76,  pulse 64.  CHEST:  Actually clear.  CARDIOVASCULAR:  Regular.  Point of maximal impulse is not palpable due to  her morbid obesity, but I do not hear any third heart sounds.  Her first and  second heart sounds sound normal.  She has a 2/6 systolic murmur.  EXTREMITIES:  Trace edema.   Her laboratory studies that Dr. Tenny Craw obtained include a CBC which was  normal, a basic metabolic panel, which was normal, including a creatinine of  0.8 and a potassium of 3.9.  Her B-type natriuretic peptide was 45, which is  normal.   She also had a chest x-ray done which showed no acute cardiopulmonary  disease.   I do not think this is heart failure causing her to have the shortness of  breath.  I am not sure what the cause of the shortness of breath is other  than this is probably where her baseline is.  She has also been  [place]  Heart Association Class II for symptoms with occasional class III symptoms.  One thing I have noticed is that she is not on spironolactone, and it might  be worthwhile considering that.  She is also not on BiDil, and those are  also potential medications she may benefit from, as we move through her  course.  At this point, I am going to try a little bit of spironolactone to  see if she can tolerate that and that may help somewhat with her symptoms.  We are going to try to get a hold of that echocardiogram and review that as  well.                                   Farris Has. [name], MD   JMH/MedQ  DD:  05/11/2006  DT:  05/11/2006  Job #:  [id number]

## 2011-01-27 NOTE — Op Note (Signed)
   NAME:  TIPHANI, MELLS                         ACCOUNT NO.:  [id number]   MEDICAL RECORD NO.:  [id number]                   PATIENT TYPE:  AMB   LOCATION:  DAY                                  FACILITY:  [facility]   PHYSICIAN:  Jerolyn Shin C. Katrinka Blazing, M.D.                DATE OF BIRTH:  1966/08/30   DATE OF PROCEDURE:  07/18/2002  DATE OF DISCHARGE:  07/18/2002                                 OPERATIVE REPORT   PREOPERATIVE DIAGNOSES:  Left breast mass.   POSTOPERATIVE DIAGNOSES:  Left breast mass.   PROCEDURE:  Left partial mastectomy.   SURGEON:  Dirk Dress. Katrinka Blazing, M.D.   DESCRIPTION OF PROCEDURE:  Under general anesthesia, the left breast, chest,  and neck were prepped and draped in a sterile field.  A curvilinear incision  was made over the mass in the 12 o'clock position of the left breast.  The  incision was extended down to the mass.  The mass was grasped with an Allis  clamp, and the mass with surrounding tissue was removed without difficulty.  It was sent for pathologic evaluation.  The tissues were closed with 2-0 and  3-0 Biosyn.  The subcutaneous tissue was closed with 3-0 Biosyn.  The skin  was closed with subcuticular 4-0 Dexon.  Dressing was placed.  The patient  was awakened from anesthesia uneventfully, transferred to her bed, and taken  to the postanesthetic care unit.                                               Dirk Dress. Katrinka Blazing, M.D.    LCS/MEDQ  D:  08/30/2002  T:  08/31/2002  Job:  [id number]

## 2011-01-27 NOTE — Consult Note (Signed)
[facility]  Patient:    [name], [name] Visit Number: [id number] MRN: [id number]          Service Type: RHE Location: [facility] Attending Physician:  Aundra Dubin Dictated by:   Nathaneil Canary, M.D. Proc. Date: 10/24/01 Admit Date:  10/24/2001   CC:         Patrica Duel, M.D.   Consultation Report  REFERRING PHYSICIAN:  Patrica Duel, M.D.  CHIEF COMPLAINT:  Back pain.  HISTORY:  Miss Munroe did go for one session of physical therapy. However, she was not able to return because of various matters concerning being approved with this. She is doing home physical therapy at this time. She is some better and is not in as acute pain as she was. She used the Trazodone and because of vivid dreams as she termed as "nightmares", she did not take the medicine. She says she would wake up short of breath. She still has a 24-month-old child at home that will also wake her up at night. Her weight is down 2 pounds. There has been no swollen joints. She still describes aching pain with some shooting pain to the right groin. She feels that she generally is sleeping fine at this point.  MEDICINES: 1. Rare ibuprofen. 2. Albuterol MDI p.r.n. 3. Zantac p.r.n. 4. Lasix 20 mg q.d. 5. Norvasc 10 mg q.d.  PHYSICAL EXAMINATION:  VITAL SIGNS:  Weight 266 pounds. Blood pressure 132/84, respirations 20.  GENERAL:  She appears well. There is no shortness of breath.  LUNGS:  Clear.  HEART:  Regular. No murmur.  MUSCULOSKELETAL:  Hands, wrists, elbows, shoulders show no active arthritis and move with mild stiffness. Trigger points at the elbows, shoulders, neck, and occiput and along the paraspinous muscles were only mildly tender. She was more tender while with compression to the lower legs that shows 1+ edema. The knees are cool and flex easily to 130 degrees. The ankles and feet were nontender.  ASSESSMENT AND PLAN: 1. Back pain. I suspect that over the course  of the last several months she    has had episodes of back spasms. She does have the known lumbar MRI that    shows no significant abnormalities. There is probably some degree of mild    degenerative arthritis. I believe ibuprofen and Tylenol are appropriate for    pain. She has not been using the Motrin that I have prescribed to her in    December. I have greatly encouraged her to continue doing her home physical    therapy. I have also encouraged her to continue losing weight at about 1 to    2 pounds per month. Walking also can help with this and the back. 2. Elevated ESR and CRT. She has quite frequent bronchitis problems, and I    suspect that this is the reason these tests remain elevated. She will    return on a p.r.n. basis. Dictated by:   Nathaneil Canary, M.D. Attending Physician:  Aundra Dubin DD:  10/24/01 TD:  10/24/01 Job: [id number]

## 2011-01-27 NOTE — H&P (Signed)
NAME:  [name], [name]                         ACCOUNT NO.:  [id number]   MEDICAL RECORD NO.:  [id number]                   PATIENT TYPE:  AMB   LOCATION:  DAY                                  FACILITY:  [facility]   PHYSICIAN:  Vickki Hearing, M.D.           DATE OF BIRTH:  04/08/1966   DATE OF ADMISSION:  DATE OF DISCHARGE:                                HISTORY & PHYSICAL   CHIEF COMPLAINT:  Left knee pain.   HISTORY:  [name] is 45 years old.  She has pain in the left knee.  She was  initially treated for avascular necrosis, did well after about eight weeks  of bracing and medication and protected weight bearing but then on or near  September 24, 2003, she had a pop in the knee, it was sore, started to swell.  She elevated it, took anti-inflammatories.  She was unable to walk for  several minutes.  Once this was relieved she continued to have medial joint  pain with pain in the posterior aspect of the knee and she will require  arthroscopic treatment with drilling of the medial femoral condyle to  stimulate new bone growth.  She also may have some medial meniscal damage  which needs to be addressed as well.   REVIEW OF SYSTEMS:  She has GERD, joint pain and joint swelling, SEASONAL  ALLERGIES.  Other systems are negative.   PRIMARY PHYSICIAN:  1. Dr. Katrinka Blazing.  2. Dr. Sherrie Mustache.  Dr. Sherrie Mustache has now moved to [facility].   PREVIOUS ILLNESSES:  None.   SURGERY:  1. For miscarriages.  2. Ectopic pregnancy.  3. Appendectomy.  4. Cholecystectomy.  5. Partial mastectomy.  6. Colonoscopy.  7. Esophageal debridement.   MEDICATIONS:  Hydrochlorothiazide, Prevacid, Vicodin, ibuprofen, Lotrel.   FAMILY HISTORY:  Heart disease, arthritis and cancer.   SOCIAL HISTORY:  She is single.  She smokes one half pack per day, does not  drink.  Completed her education up to the 12th grade.   EXAM:  She is well-developed and well-nourished.  Grooming and hygiene are  normal.  No deformity.  Body  habitus large.  Weight 225, pulse 80,  respiratory rate 18.  CARDIOVASCULAR:  No swelling, no varicosities, no edema or tenderness.  Pulse is normal, temperature normal.  Cervical spine negative for lymph  nodes.  MUSCULOSKELETAL EXAM:  Gait and station shows pain and antalgic gait of left  lower extremity.  She is tender over the medial joint line.  There is mild  joint effusion.  Her range of motion has improved from initial evaluation in  August to 95 degrees.  Her knee is stable.  The muscle strength and tone are  normal.  The skin remains intact.  NEURO:  She is alert, awake and oriented x3.  Her mood and affect is normal.   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  There is  significant degenerative changes  of the medial compartment.   FINAL DIAGNOSIS:  Avascular necrosis.   Recommend arthroscopy left knee and drilling of the medial femoral condyle.     ___________________________________________                                         Vickki Hearing, M.D.   SEH/MEDQ  D:  10/02/2003  T:  10/02/2003  Job:  [id number]

## 2011-01-27 NOTE — Discharge Summary (Signed)
[name], [name]               ACCOUNT NO.:  [id number]   MEDICAL RECORD NO.:  [id number]          PATIENT TYPE:  INP   LOCATION:  4705                         FACILITY:  [facility]   PHYSICIAN:  [name], M.D.  DATE OF BIRTH:  03/06/1966   DATE OF ADMISSION:  12/21/2005  DATE OF DISCHARGE:  12/22/2005                                 DISCHARGE SUMMARY   CARDIOLOGIST:  Dr. Vida [name].   DISCHARGE DIAGNOSES:  1.  Nonischemic cardiomyopathy status post BiV ICD implant on 12/21/2005      with an ejection fraction of 15-20%.  2.  Class III heart failure marked by tissue Doppler dyssynchrony.   SECONDARY DIAGNOSES:  Includes:  1.  Obesity.  2.  Hypertension.  3.  Congestive heart failure.   PROCEDURES PERFORMED DURING THIS HOSPITALIZATION:  Include: An implanted BiV  ICD implant via the left subclavian vein without any  immediate  complications on 12/21/2005.   HISTORY OF PRESENT ILLNESS:  This is a 45 year old female with a  longstanding history of hypertension, obesity and congestive heart failure.  Her ejection fraction was 15 x 20%, QRS duration was 110 milliseconds, and a  subsequent tissue Doppler echo had demonstrated marked tissue Doppler  dyssynchrony between the lateral wall and septum.  She was admitted today  for a biventricular ICD implant.  Dr. Lewayne [name] implanted the BiV ICD via  the left subclavian vein on Barbara 12, 2007.  It had acceptable DFTs.  The  patient was also going to be placed in the Decode study.  On Barbara 13, 2007,  we interrogated the device and found the device, BiV ICD, functioning  appropriately by Dr. Ladona [name].  Dr. Graciela [name] in to see and examine the patient.  The patient is stable for discharge home.   We will discharge the patient on outpatient:  1.  Coreg 6.25 mg b.i.d.  2.  She also will be discharged on hydrochlorothiazide 25 mg daily.  3.  Zelnorm 6 mg daily.  4.  Potassium chloride 20 mEq daily.  5.  Lotrel 05/20 daily.  6.   Prevacid 30 mg daily.  7.  Compare study drugs.  8.  Prenatal vitamin daily.   The patient will follow up with the pacemaker clinic at [facility]  on Jan 10, 2006 at 9:20 a.m. and also with Dr. Ladona [name] in the [place]  office on March 07, 2006 at 2:15 and Dr. Dorethea [name] in the [place] office on  the Jan 23, 2006 at 10:45.   The patient was instructed to eat a low cholesterol/low fat diet and call if  she had any problems with her device site with redness, drainage or  bleeding.     ______________________________  Barbara Humphrey, NP    ______________________________  [name], M.D.    AH/MEDQ  D:  01/25/2006  T:  01/26/2006  Job:  [id number]

## 2011-01-27 NOTE — Cardiovascular Report (Signed)
[name], [name]               ACCOUNT NO.:  [id number]   MEDICAL RECORD NO.:  [id number]          PATIENT TYPE:  OIB   LOCATION:  2899                         FACILITY:  [facility]   PHYSICIAN:  Vida Roller, M.D.   DATE OF BIRTH:  01/21/1966   DATE OF PROCEDURE:  06/29/2005  DATE OF DISCHARGE:                              CARDIAC CATHETERIZATION   HISTORY OF PRESENT ILLNESS:  [name] is a 45 year old woman with a  dilated cardiomyopathy. This is an evaluation for coronary ischemia. Her  ejection fraction is 25% by echocardiogram.   PROCEDURE:  1.  Left heart catheterization.  2.  Selective coronary angiography.  3.  Right heart catheterization.  4.  Left ventriculography.   DESCRIPTION OF PROCEDURE:  After obtaining informed consent, the patient was  brought to the cardiac catheterization laboratory in the fasting state,  where she was prepped and draped in the usual sterile manner. Local  anesthetic was obtained over the right groin using 1% lidocaine without  epinephrine. The right femoral artery and the right femoral vein were both  cannulated. The right femoral vein with a 7 [place], the right femoral artery  with a 6 [place], both 10 cm sheaths. Left heart catheterization was  performed using a 6 French Judkins left #4, a 6 French Judkins left #3.5,  and a 6-French pigtail catheter. A right heart catheterization was performed  with a Swan-Ganz catheter. At the conclusion of the procedure, the catheters  were removed. The patient was moved back to the cardiology holding area  where the femoral artery sheath was removed and the femoral venous sheath  was removed. Hemostasis was obtained using direct manual pressure. At the  conclusion, there was no evidence of ecchymosis or hematoma formation.  Distal pulses were intact. Total fluoroscopic time 6.9 minutes. Total  ionized contrast 95 cc.   RESULTS:  PRESSURES:  1.  Right atrium A-wave 15, V-wave 14, mean of 12 mmHg.  2.   Right ventricular pressure 38/4 with an end-diastolic pressure of 10      mmHg.  3.  Pulmonary artery pressure 29/8 with a mean of 19 mmHg.  4.  Pulmonary capillary wedge pressure A-wave 24, V-wave 24, mean of 23      mmHg.  5.  The aortic pressure 116/76 with a mean of 93 mmHg.  6.  Left ventricular pressure 122/8 with an end-diastolic pressure of 18      mmHg.  7.  Pulmonary artery saturation 63%. Aortic saturation 91%.  8.  Cardiac output by the Fick method 4.5 with an index of 2.0 by      thermodilution, 6.7 with an index of 3.0.   CORONARY ANGIOGRAPHY:  The left main coronary artery is absent.   The left anterior descending coronary artery is a large trans-apical vessel,  which appears to have, if not a separate ostium, then a cloacal ostium.  There are 3 diagonals. The entire LAD system is free of disease.   Circumflex coronary artery also has a separate ostium. It is a dominant  vessel with 2 large obtuse marginals and a large  posterior descending. It is  free of disease.   The right coronary artery is a small dominant vessel, which is free of  disease.   Left ventriculogram reveals global hypokinesis with an ejection fraction of  25% and 1+ mitral regurgitation.   ASSESSMENT:  1.  Normal coronaries.  2.  Cardiomyopathy.  3.  Normal right heart pressures.   PLAN:  1.  Continue medical therapy.  2.  Followup with me in [place] in 4 to 6 weeks.      Vida Roller, M.D.  Electronically Signed     JH/MEDQ  D:  06/29/2005  T:  06/29/2005  Job:  [id number]   cc:   Annia Friendly. Loleta Chance, MD  Fax: [phone]

## 2011-01-27 NOTE — Op Note (Signed)
NAME:  [name], [name]                         ACCOUNT NO.:  [id number]   MEDICAL RECORD NO.:  [id number]                   PATIENT TYPE:  AMB   LOCATION:  DAY                                  FACILITY:  [facility]   PHYSICIAN:  Vickki Hearing, M.D.           DATE OF BIRTH:  1965-10-22   DATE OF PROCEDURE:  10/06/2003  DATE OF DISCHARGE:                                 OPERATIVE REPORT   HISTORY:  [name] is a 45 year old female with a chief complaint of left knee  pain.  She was initially treated for avascular necrosis which was documented  by MRI.  We treated her with eight weeks of bracing and medication with  protected weightbearing.  However, on or near January 13th, she had an  injury and felt a pop in the knee.  She had swelling and soreness.  She took  anti-inflammatories and elevated it, and for several minutes she was unable  to walk.  Once this resolved, she continued with medial joint pain,  posterior knee pain, and did not improve.  We recommended arthroscopic  treatment with drilling of the medial femoral condyle and diagnostic  arthroscopy as well.   INDICATIONS FOR PROCEDURE:  Pain, gait abnormality and gait disturbance, and  symptomatic avascular necrosis unresponsive to nonoperative treatment.   Major physical findings included pain and antalgic gait of the left lower  extremity with a tender medial joint line.  Range of motion 95 degrees.   MRI showed avascular necrosis and degenerative changes of the medial  compartment.   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 Avascular necrosis of the medial femoral condyle.  2. Lateral meniscal tear.  3. Loose body.  4. Degenerative arthritis of the medial femoral condyle and patellofemoral     joint.   FINDINGS:  Grade 2 chondromalacia of the patella, mainly of the lateral  facet; osteophyte of the medial  femoral condyle; torn lateral meniscus; and  loose body.   PROCEDURE:  1. Arthroscopy, left knee.  2. Antegrade drilling of the medial femoral condyle.  3. Removal of loose body.  4. Lateral meniscectomy.   SURGEON:  Vickki Hearing, M.D.   ANESTHESIA:  General.   COMPLICATIONS:  None.   DRAINS:  None.   DESCRIPTION OF PROCEDURE:  [name] was identified by arm band in  the holding area.  A thorough chart review indicated that she was to have a  left knee arthroscopy and drilling of the medial femoral condyle.  She  marked her left knee with an indelible marker, and I followed by signing my  name and writing the procedure on the left knee.  She was given 1 g of Ancef  and taken to the operating room for a general anesthetic which was  successful.   She had a sterile prep and drape  of the left knee.  We took a time out.  We  stated the patient's name, procedure, and extremity.  All present concurred,  and this coincided with the consent form as well.   A standard arthroscopy was performed for diagnostic purpose.  The lateral  meniscus tear was resected and then balanced with a 4-0 aggressive meniscus  blade.  The medial meniscus was found to be not torn, and we did an  antegrade drilling with multiple drill holes into the subchondral bone to  stimulate increased vascularity on the medial side.  We also removed a loose  body which was in the lateral compartment.  I could not tell where this  fragment originated.   The fragment appeared to be all chondral, and we then washed the knee,  placed Steri-Strips, injected 30 cc of Marcaine, applied sterile dressings,  a CryoCuff, and then the patient was extubated.  She was in stable  condition.  She will be discharged to home, nonweightbearing for six weeks  with aggressive active range of motion immediately and a followup scheduled  for January 27th.  She was discharged on Lorcet Plus for pain, one every  four hours  p.r.n.      ___________________________________________                                            Vickki Hearing, M.D.   SEH/MEDQ  D:  10/06/2003  T:  10/06/2003  Job:  [id number]

## 2011-01-27 NOTE — H&P (Signed)
   NAME:  [name], [name]                         ACCOUNT NO.:  [id number]   MEDICAL RECORD NO.:  [id number]                   PATIENT TYPE:  AMB   LOCATION:  DAY                                  FACILITY:  [facility]   PHYSICIAN:  Jerolyn Shin C. Katrinka Blazing, M.D.                DATE OF BIRTH:  1966-06-17   DATE OF ADMISSION:  DATE OF DISCHARGE:                                HISTORY & PHYSICAL   HISTORY OF PRESENT ILLNESS:  [age]-year-old female with history of  chronic abdominal pain.  She has had workup by Dr. Tyler Deis and the only  finding has been focal nodular hyperplasia of the liver.  She continues to  remain symptomatic.  She has had worsening constipation.  She has had  episodes of nausea without vomiting.  She has pain with meals.  She was  referred for upper and lower endoscopies.   PAST HISTORY:  She has hypertension, bronchitis, chronic low back pain,  anemia, hiatal hernia, gastroesophageal reflux disease and irritable bowel  syndrome.   MEDICATIONS:  1. Lotrel 5/20 mg daily.  2. Hydrochlorothiazide 25 mg one-half tab daily.  3. Zyrtec 10 mg daily.  4. Zelnorm 6 mg b.i.d.  5. Vitamin E capsule daily.   SURGERY:  Cholecystectomy, appendectomy and multiple ectopic pregnancies.   FAMILY HISTORY:  Family history is positive for multiple carcinomas.   PHYSICAL EXAMINATION:  GENERAL:  On exam, she is an obese female in no acute  distress.  VITAL SIGNS:  Blood pressure 130/82, pulse 72, respirations 16.  Weight 264  pounds.  HEENT:  Unremarkable.  NECK:  Neck supple without JVD or bruit.  CHEST:  Chest clear to auscultation.  HEART:  Regular rate and rhythm without murmur, gallop or rub.  ABDOMEN:  Diffuse tenderness.  No masses.  EXTREMITIES:  No cyanosis, clubbing or edema.  NEUROLOGIC:  Exam nonfocal.   IMPRESSION:  1. Chronic recurrent abdominal pain.  2. Gastroesophageal reflux disease.  3. Change in bowel habits with progressive constipation.  4. Hypertension.  5.  Low back pain.  6. Irritable bowel syndrome.   PLAN:  EGD and colonoscopy.                                               Dirk Dress. Katrinka Blazing, M.D.    LCS/MEDQ  D:  01/06/2003  T:  01/07/2003  Job:  [id number]

## 2011-01-27 NOTE — Assessment & Plan Note (Signed)
[facility]                            CARDIOLOGY OFFICE NOTE   NAME:[name], [name]                      MRN:          [id number]  DATE:07/23/2006                            DOB:          06-17-1966    IDENTIFICATION:  [name] is a 45 year old woman with history of  nonischemic cardiomyopathy.  I last saw her in clinic back in October.   When I saw her last, I added BiDil to her regimen and switched her from  Lotrel to benazepril.   When the patient first made the change, she noted a headache, but this  has improved.  She says overall she thinks her lower extremity edema may  have improved some.   Note: About a week ago, she had a spell while she was in a nursing home  visiting with significant shortness of breath, agitation.  She got home  and took another Lasix pill at night and noticed it took her a few hours  to urinate after this but then began to feel better.  She took her  morning Lasix as usual, has not had any episodes since.   CURRENT MEDICATIONS:  1. Prenatal vitamins daily.  2. Potassium 20 mEq b.i.d.  3. Lasix 40 daily.  4. Coreg 80 daily.  5. Prilosec 20 daily.  6. Implanted birth control left upper extremity.  7. Aldactone 12.5 daily.  8. Benazepril 40 daily.  9. BiDil 3 times a day.  10.Vitamin B12 recently started at 1000 mcg daily.   PHYSICAL EXAMINATION:  GENERAL: The patient is in no distress.  VITAL SIGNS: Blood pressure 98/68, pulse 66 and regular. Weight 274.  LUNGS:  Relatively clear.  CARDIAC: Regular rate and rhythm.  S1, S2.  A 1/6 systolic murmur, no  definite S3.  ABDOMEN: Benign.  EXTREMITIES: No significant edema.   IMPRESSION:  1. Cardiomyopathy.  Again, volume status overall looks good.  Would      keep her on the current regimen and follow her up in several      months.  2. Hypertension.  The patient really denies a lot of dizziness.  I      would keep where is and see how her blood pressure  regulates with      BiDil.   I have recommended she increase weighing herself to every day.  She had  been once a week by [employer].  If she has any increase in  weight over a couple of days, she should call, and we will adjust her  Lasix as needed or see her sooner.     Pricilla Riffle, MD, [facility]  Electronically Signed    PVR/MedQ  DD: 07/23/2006  DT: 07/23/2006  Job #: [id number]   cc:   Mila Homer. [name], M.D.

## 2011-01-27 NOTE — H&P (Signed)
   NAME:  [name], [name]                         ACCOUNT NO.:  [id number]   MEDICAL RECORD NO.:  [id number]                   PATIENT TYPE:  AMB   LOCATION:  DAY                                  FACILITY:  [facility]   PHYSICIAN:  Jerolyn Shin C. Katrinka Blazing, M.D.                DATE OF BIRTH:  1966-01-28   DATE OF ADMISSION:  DATE OF DISCHARGE:                                HISTORY & PHYSICAL   HISTORY OF PRESENT ILLNESS:  [age]-year-old female with history of  macrocystic disease of the breasts.  She has developed a solid mass of the  left breast which is confirmed by mammography and ultrasound.  She is  scheduled for excision.  Her sister has a history of breast cancer at age  38.   PAST HISTORY:  She has hypertension and chronic bronchitis.   PAST SURGICAL HISTORY:  Cholecystectomy, appendectomy, ectopic pregnancies  x2.   MEDICATIONS:  1. Lotrel 5/10 every day.  2. Lasix 20 mg every day.   ALLERGIES:  She has no known drug allergies.   PHYSICAL EXAMINATION:  VITAL SIGNS:  Blood pressure 148/110, pulse 84,  respirations 18.  Weight 263 pounds.  HEENT:  No abnormalities noted.  NECK:  Neck supple without JVD or bruit.  CHEST:  Chest clear to auscultation.  HEART:  Regular rate and rhythm without murmur, gallop or rub.  BREASTS:  Macromastia bilaterally with a mass in the 12 o'clock position of  the left breast.  The mass is nodular, rubbery, mobile, with no skin changes  or nipple changes.  ABDOMEN:  Abdomen obese, soft, nontender.  No masses.  EXTREMITIES:  No cyanosis, clubbing or edema.  NEUROLOGIC:  No focal motor, sensory or cerebellar deficit.   IMPRESSION:  1. Left breast mass.  2. Macrocystic disease of breasts.  3. Hypertension.  4. Morbid obesity.   PLAN:  Left partial mastectomy.                                              Dirk Dress. Katrinka Blazing, M.D.   LCS/MEDQ  D:  07/17/2002  T:  07/18/2002  Job:  [id number]

## 2011-02-02 ENCOUNTER — Other Ambulatory Visit (HOSPITAL_COMMUNITY): Payer: Self-pay | Admitting: Oncology

## 2011-02-02 ENCOUNTER — Encounter (HOSPITAL_COMMUNITY): Payer: Medicare Other | Attending: Oncology

## 2011-02-02 ENCOUNTER — Encounter (HOSPITAL_COMMUNITY): Payer: Medicare Other | Attending: Family Medicine

## 2011-02-02 DIAGNOSIS — D509 Iron deficiency anemia, unspecified: Secondary | ICD-10-CM | POA: Insufficient documentation

## 2011-02-02 LAB — CBC
MCH: 28.9 pg (ref 26.0–34.0)
Platelets: 317 10*3/uL (ref 150–400)
RBC: 3.5 MIL/uL — ABNORMAL LOW (ref 3.87–5.11)

## 2011-02-02 LAB — FERRITIN: Ferritin: 23 ng/mL (ref 10–291)

## 2011-02-13 ENCOUNTER — Encounter (HOSPITAL_COMMUNITY): Payer: Medicare Other | Attending: Oncology

## 2011-02-13 DIAGNOSIS — D509 Iron deficiency anemia, unspecified: Secondary | ICD-10-CM

## 2011-02-20 ENCOUNTER — Encounter: Payer: Self-pay | Admitting: Internal Medicine

## 2011-02-28 ENCOUNTER — Ambulatory Visit (INDEPENDENT_AMBULATORY_CARE_PROVIDER_SITE_OTHER): Payer: Medicare Other | Admitting: Internal Medicine

## 2011-02-28 ENCOUNTER — Encounter: Payer: Self-pay | Admitting: Internal Medicine

## 2011-02-28 DIAGNOSIS — I5022 Chronic systolic (congestive) heart failure: Secondary | ICD-10-CM

## 2011-02-28 DIAGNOSIS — Z95810 Presence of automatic (implantable) cardiac defibrillator: Secondary | ICD-10-CM

## 2011-02-28 DIAGNOSIS — I428 Other cardiomyopathies: Secondary | ICD-10-CM

## 2011-02-28 NOTE — Patient Instructions (Signed)
Your physician recommends that you schedule a follow-up appointment in: 1 year -- continue remote transactions

## 2011-03-01 ENCOUNTER — Ambulatory Visit (INDEPENDENT_AMBULATORY_CARE_PROVIDER_SITE_OTHER): Payer: Medicare Other | Admitting: Cardiology

## 2011-03-01 ENCOUNTER — Encounter: Payer: Self-pay | Admitting: Internal Medicine

## 2011-03-01 ENCOUNTER — Encounter: Payer: Self-pay | Admitting: Cardiology

## 2011-03-01 VITALS — BP 141/87 | HR 81 | Wt 272.0 lb

## 2011-03-01 DIAGNOSIS — I5022 Chronic systolic (congestive) heart failure: Secondary | ICD-10-CM

## 2011-03-01 DIAGNOSIS — I340 Nonrheumatic mitral (valve) insufficiency: Secondary | ICD-10-CM

## 2011-03-01 DIAGNOSIS — I059 Rheumatic mitral valve disease, unspecified: Secondary | ICD-10-CM

## 2011-03-01 NOTE — Progress Notes (Signed)
HPI [name] returns today for followup. She is a very pleasant 45 year old woman with a nonischemic cardiomyopathy and chronic systolic heart failure. The patient is status post ICD implantation. Most recently, she has been hospitalized with worsening anemia. I do not have all her records but it sounds as if she has small intestinal bleeding. She states that she underwent a procedure where this was cauterized. Since then she has been stable. She currently has class II congestive heart failure symptoms. She has had no recent ICD shocks. She denies chest pain. She has chronic mild peripheral edema. Not on File   Current Outpatient Prescriptions  Medication Sig Dispense Refill  . albuterol (VENTOLIN HFA) 108 (90 BASE) MCG/ACT inhaler Inhale 2 puffs into the lungs every 6 (six) hours as needed.        . benazepril (LOTENSIN) 40 MG tablet Take 40 mg by mouth daily.        . carvedilol (COREG CR) 80 MG 24 hr capsule Take 80 mg by mouth daily.        . cyclobenzaprine (FLEXERIL) 10 MG tablet Take 10 mg by mouth 3 (three) times daily as needed.        . gabapentin (NEURONTIN) 300 MG capsule Take 300 mg by mouth 3 (three) times daily. 1 capsule in am and 2 in pm       . isosorbide-hydrALAZINE (BIDIL) 20-37.5 MG per tablet Take 1 tablet by mouth 3 (three) times daily.        Marland Kitchen omeprazole (PRILOSEC) 40 MG capsule Take 40 mg by mouth daily.        . potassium chloride SA (K-DUR,KLOR-CON) 20 MEQ tablet Take 20 mEq by mouth 4 (four) times daily.        . Prenatal Vit-Fe Fumarate-FA (PRENATABS FA) TABS Take 1 tablet by mouth daily.        Marland Kitchen spironolactone (ALDACTONE) 25 MG tablet Take 12.5 mg by mouth daily.        Marland Kitchen torsemide (DEMADEX) 20 MG tablet Take 40 mg by mouth daily.           Past Medical History  Diagnosis Date  . CHF (congestive heart failure)     chronic systolic CHF  . Nonischemic cardiomyopathy   . Mitral regurgitation     moderate to severe  . S/P cardiac catheterization 10/06   normal coronary arteries  . LBBB (left bundle branch block)   . HTN (hypertension)   . Asthma   . IBS (irritable bowel syndrome)     with primarily constipation  . Morbid obesity   . GERD (gastroesophageal reflux disease)   . IDA (iron deficiency anemia)     with w/u as outlined in HPI on 08/15/10    ROS:   All systems reviewed and negative except as noted in the HPI.   Past Surgical History  Procedure Date  . Crdt-implantation 4/07    [employer]. remote- yes  . Appendectomy   . Cholecystectomy     biliary dyskinesia  . Mastectomy 2003    left, partial  . Knee arthroscopy 2005    left     Family History  Problem Relation Age of Onset  . Coronary artery disease      premature CAD- no family Hx   . Colon cancer Neg Hx     no family Hx of polyps too     History   Social History  . Marital Status: [name]    Spouse Name: [name]  Number of Children: [name]  . Years of Education: [name]   Occupational History  . Not on file.   Social History Main Topics  . Smoking status: Never Smoker   . Smokeless tobacco: Not on file  . Alcohol Use: No  . Drug Use: No  . Sexually Active: Not on file   Other Topics Concern  . 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[name]- 1 daughter age 42. Does not get regular exercise.       BP 118/81  Pulse 75  Wt 274 lb (124.286 kg)  Physical Exam: obese appearing NAD HEENT: Unremarkable Neck:  No JVD, no thyromegally Lymphatics:  No adenopathy Back:  No CVA tenderness Lungs:  Clear HEART:  Regular rate rhythm, no murmurs, no rubs, no clicks Abd:  Obese, positive bowel sounds, no organomegally, no rebound, no guarding Ext:  2 plus pulses, no edema, no cyanosis, no clubbing Skin:  No rashes no nodules Neuro:  CN II through XII intact, motor grossly intact  DEVICE  Normal device function.  See PaceArt for details.   Assess/Plan:

## 2011-03-01 NOTE — Assessment & Plan Note (Signed)
Stable. Meds reviewed and she is compliant. She's also compliant with diet. No changes at this point in time.

## 2011-03-01 NOTE — Assessment & Plan Note (Signed)
Her symptoms are class II. I've asked that she maintain a low-sodium diet. She will continue her current medications. I've asked that she increase her activity. She is morbidly obese and needs to lose weight and I have discussed this with the patient.

## 2011-03-01 NOTE — Assessment & Plan Note (Signed)
Her device is working normally. Will recheck in several months. 

## 2011-03-01 NOTE — Progress Notes (Signed)
HPI [name] returns today for evaluation and management of her nonischemic cardiomyopathy and chronic systolic congestive heart failure. She also has a history of moderate mitral regurgitation.  She was evaluated by Dr. [name] last week in the device clinic. All checked out well.  She's been able to keep the 10 pounds she lost off. She denies orthopnea, PND or edema. She does have her baseline dyspnea on exertion. She denies palpitations or syncope.  Electrocardiogram was not repeated today.  Past Medical History  Diagnosis Date  . CHF (congestive heart failure)     chronic systolic CHF  . Nonischemic cardiomyopathy   . Mitral regurgitation     moderate to severe  . S/P cardiac catheterization 10/06    normal coronary arteries  . LBBB (left bundle branch block)   . HTN (hypertension)   . Asthma   . IBS (irritable bowel syndrome)     with primarily constipation  . Morbid obesity   . GERD (gastroesophageal reflux disease)   . IDA (iron deficiency anemia)     with w/u as outlined in HPI on 08/15/10    Past Surgical History  Procedure Date  . Crdt-implantation 4/07    [employer]. remote- yes  . Appendectomy   . Cholecystectomy     biliary dyskinesia  . Mastectomy 2003    left, partial  . Knee arthroscopy 2005    left    Family History  Problem Relation Age of Onset  . Coronary artery disease      premature CAD- no family Hx   . Colon cancer Neg Hx     no family Hx of polyps too    History   Social History  . Marital Status: Single    Spouse Name: [name]    Number of Children: [name]  . Years of Education: [name]   Occupational History  . Not on file.   Social History Main Topics  . Smoking status: Never Smoker   . Smokeless tobacco: Not on file  . Alcohol Use: No  . Drug Use: No  . Sexually Active: Not on file   Other Topics Concern  . 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 Does not  get regular exercise.      Not on File  Current Outpatient Prescriptions  Medication Sig Dispense Refill  . albuterol (VENTOLIN HFA) 108 (90 BASE) MCG/ACT inhaler Inhale 2 puffs into the lungs every 6 (six) hours as needed.        . benazepril (LOTENSIN) 40 MG tablet Take 40 mg by mouth daily.        . carvedilol (COREG CR) 80 MG 24 hr capsule Take 80 mg by mouth daily.        . cyclobenzaprine (FLEXERIL) 10 MG tablet Take 10 mg by mouth 3 (three) times daily as needed.        . gabapentin (NEURONTIN) 300 MG capsule Take 300 mg by mouth 3 (three) times daily. 1 capsule in am and 2 in pm       . isosorbide-hydrALAZINE (BIDIL) 20-37.5 MG per tablet Take 1 tablet by mouth 3 (three) times daily.        Marland Kitchen omeprazole (PRILOSEC) 40 MG capsule Take 40 mg by mouth daily.        . potassium chloride SA (K-DUR,KLOR-CON) 20 MEQ tablet Take 20 mEq by mouth 4 (four) times daily.        Marland Kitchen  Prenatal Vit-Fe Fumarate-FA (PRENATABS FA) TABS Take 1 tablet by mouth daily.        Marland Kitchen spironolactone (ALDACTONE) 25 MG tablet Take 12.5 mg by mouth daily.        Marland Kitchen torsemide (DEMADEX) 20 MG tablet Take 40 mg by mouth daily.          ROS Negative other than HPI.   PE General Appearance: well developed, well nourished in no acute distress, morbidly obese HEENT: symmetrical face, PERRLA, good dentition  Neck: no JVD, thyromegaly, or adenopathy, trachea midline Chest: symmetric without deformity Cardiac: PMI Difficult to appreciate,RRR, normal S1, S2, no gallop, Soft systolic murmur at the apex Lung: clear to ausculation and percussion Vascular: all pulses full without bruits  Abdominal: nondistended, nontender, good bowel sounds, no HSM, no bruits Extremities: no cyanosis, clubbing or edema, no sign of DVT, no varicosities  Skin: normal color, no rashes Neuro: alert and oriented x 3, non-focal Pysch: normal affect  Filed Vitals:   03/01/11 1049  BP: 141/87  Pulse: 81  Weight: 272 lb (123.378 kg)     EKG  Labs and Studies Reviewed.   Lab Results  Component Value Date   WBC 6.0 02/02/2011   HGB 10.1* 02/02/2011   HCT 31.8* 02/02/2011   MCV 90.9 02/02/2011   PLT 317 02/02/2011      Chemistry      Component Value Date/Time   NA 135 10/05/2010 1900   K 3.8 10/05/2010 1900   CL 95* 10/05/2010 1900   CO2 31 10/05/2010 1900   BUN 8 10/05/2010 1900   CREATININE 0.88 10/05/2010 1900      Component Value Date/Time   CALCIUM 8.1* 10/05/2010 1900   ALKPHOS 66 10/05/2010 1900   AST 15 10/05/2010 1900   ALT 12 10/05/2010 1900   BILITOT 0.4 10/05/2010 1900       No results found for this basename: CHOL   No results found for this basename: HDL   No results found for this basename: LDLCALC   No results found for this basename: TRIG   No results found for this basename: CHOLHDL   Lab Results  Component Value Date   HGBA1C 6.1 07/12/2009   Lab Results  Component Value Date   ALT 12 10/05/2010   AST 15 10/05/2010   ALKPHOS 66 10/05/2010   BILITOT 0.4 10/05/2010   No results found for this basename: TSH

## 2011-03-01 NOTE — Patient Instructions (Signed)
**Note De-identified Nayleen Janosik Obfuscation** Your physician has requested that you have an echocardiogram. Echocardiography is a painless test that uses sound waves to create images of your heart. It provides your doctor with information about the size and shape of your heart and how well your heart's chambers and valves are working. This procedure takes approximately one hour. There are no restrictions for this procedure.  Your physician recommends that you continue on your current medications as directed. Please refer to the Current Medication list given to you today.  Your physician recommends that you schedule a follow-up appointment in: 6 months  

## 2011-03-02 ENCOUNTER — Ambulatory Visit (HOSPITAL_COMMUNITY)
Admission: RE | Admit: 2011-03-02 | Discharge: 2011-03-02 | Disposition: A | Discharge status: Home / Self Care | Payer: Medicare Other | Source: Ambulatory Visit | Attending: Cardiology | Admitting: Cardiology

## 2011-03-02 DIAGNOSIS — Z95810 Presence of automatic (implantable) cardiac defibrillator: Secondary | ICD-10-CM | POA: Insufficient documentation

## 2011-03-02 DIAGNOSIS — I5022 Chronic systolic (congestive) heart failure: Secondary | ICD-10-CM

## 2011-03-02 DIAGNOSIS — I428 Other cardiomyopathies: Secondary | ICD-10-CM | POA: Insufficient documentation

## 2011-03-02 DIAGNOSIS — I059 Rheumatic mitral valve disease, unspecified: Secondary | ICD-10-CM

## 2011-03-02 DIAGNOSIS — I509 Heart failure, unspecified: Secondary | ICD-10-CM | POA: Insufficient documentation

## 2011-03-06 ENCOUNTER — Telehealth (HOSPITAL_COMMUNITY): Payer: Self-pay | Admitting: *Deleted

## 2011-03-06 ENCOUNTER — Other Ambulatory Visit (HOSPITAL_COMMUNITY): Payer: Medicare Other

## 2011-03-10 ENCOUNTER — Ambulatory Visit (HOSPITAL_COMMUNITY): Payer: Medicare Other

## 2011-03-14 ENCOUNTER — Ambulatory Visit (HOSPITAL_COMMUNITY): Payer: Medicare Other | Admitting: Oncology

## 2011-03-20 NOTE — Telephone Encounter (Signed)
Unintentional pt chart access.

## 2011-04-24 ENCOUNTER — Ambulatory Visit (HOSPITAL_COMMUNITY): Payer: Medicare Other

## 2011-04-26 ENCOUNTER — Encounter (HOSPITAL_COMMUNITY): Payer: Self-pay | Admitting: Oncology

## 2011-04-26 ENCOUNTER — Encounter (HOSPITAL_COMMUNITY): Payer: Medicare Other | Attending: Oncology | Admitting: Oncology

## 2011-04-26 DIAGNOSIS — K922 Gastrointestinal hemorrhage, unspecified: Secondary | ICD-10-CM

## 2011-04-26 DIAGNOSIS — D509 Iron deficiency anemia, unspecified: Secondary | ICD-10-CM | POA: Insufficient documentation

## 2011-04-26 DIAGNOSIS — D638 Anemia in other chronic diseases classified elsewhere: Secondary | ICD-10-CM

## 2011-04-26 DIAGNOSIS — G609 Hereditary and idiopathic neuropathy, unspecified: Secondary | ICD-10-CM | POA: Insufficient documentation

## 2011-04-26 DIAGNOSIS — G629 Polyneuropathy, unspecified: Secondary | ICD-10-CM | POA: Insufficient documentation

## 2011-04-26 LAB — RETICULOCYTES
RBC.: 4.48 MIL/uL (ref 3.87–5.11)
Retic Count, Absolute: 67.2 10*3/uL (ref 19.0–186.0)
Retic Ct Pct: 1.5 % (ref 0.4–3.1)

## 2011-04-26 LAB — CBC
Hemoglobin: 12.9 g/dL (ref 12.0–15.0)
MCHC: 32.6 g/dL (ref 30.0–36.0)
RDW: 15.2 % (ref 11.5–15.5)
WBC: 7.2 10*3/uL (ref 4.0–10.5)

## 2011-04-26 NOTE — Progress Notes (Signed)
This office note has been dictated.

## 2011-04-26 NOTE — Progress Notes (Signed)
FEDA 2o TO SB AVMS- responding to IVFE. DBE/APC MAY 2012 [facility] DR. Gwinda Passe

## 2011-04-26 NOTE — Progress Notes (Signed)
Labs drawn today for cbc,ret,ferr 

## 2011-04-26 NOTE — Patient Instructions (Signed)
[facility]  Discharge Instructions  RECOMMENDATIONS MADE BY THE CONSULTANT AND ANY TEST RESULTS WILL BE SENT TO YOUR REFERRING DOCTOR.   EXAM FINDINGS BY MD TODAY AND SIGNS AND SYMPTOMS TO REPORT TO CLINIC OR PRIMARY MD:  Labs today then to see [place] in 4 months. Please call us on Friday to review lab results.  I acknowledge that I have been informed and understand all the instructions given to me and received a copy. I do not have any more questions at this time, but understand that I may call the Specialty Clinic at [facility] at [phone] during business hours should I have any further questions or need assistance in obtaining follow-up care.    __________________________________________  _____________  __________ Signature of Patient or Authorized Representative            Date                   Time    __________________________________________ Nurse's Signature

## 2011-04-26 NOTE — Progress Notes (Signed)
CC:   Mila Homer. [name], M.D. Jonette Eva, M.D.  DIAGNOSIS: 1. Severe iron deficiency anemia secondary to gastrointestinal blood     losses, status post double balloon enteroscopy and argon plasma     coagulation of bleeding lesions in the jejunum by Dr. Achilles Dunk     at [facility] on 01/23/2011. 2. Anemia of chronic disease as well. 3. Massive obesity. 4. Cardiomyopathy. 5. Peripheral neuropathy on gabapentin. 6. Obstructive sleep apnea. 7. History of asthma. 8. History of irritable bowel syndrome. 9. Hypertension.  [name] is here today, and we do need to draw some blood work, namely CBC, reticulocyte count, and ferritin level.  She was only ferritin of 23 in May.  In spite of several doses of Feraheme, the highest her ferritin rose to was 74, but her hemoglobin did improve to as high as 11.4.  I suspect we did not get it up any higher because she has an element of anemia of chronic disease, as well, with her cardiomyopathy, peripheral neuropathy, etc.  She is not a diabetic, however.  She has been doing fairly well, stable and does not have any clear-cut signs of iron deficiency symptomatically, so we will see what her counts are today including ferritin, decide whether she needs IV Feraheme again or whether she can be watched since this procedure was done in May and hopefully it was successful.  We will see her in 4 months.    ______________________________ Ladona Horns. Mariel Sleet, MD ESN/MEDQ  D:  04/26/2011  T:  04/26/2011  Job:  [id number]

## 2011-04-27 ENCOUNTER — Other Ambulatory Visit (HOSPITAL_COMMUNITY): Payer: Self-pay | Admitting: Oncology

## 2011-04-27 DIAGNOSIS — D509 Iron deficiency anemia, unspecified: Secondary | ICD-10-CM

## 2011-04-28 NOTE — Progress Notes (Signed)
error 

## 2011-06-01 ENCOUNTER — Encounter: Payer: Self-pay | Admitting: Internal Medicine

## 2011-06-01 ENCOUNTER — Other Ambulatory Visit: Payer: Self-pay | Admitting: Internal Medicine

## 2011-06-01 ENCOUNTER — Ambulatory Visit (INDEPENDENT_AMBULATORY_CARE_PROVIDER_SITE_OTHER): Payer: Medicare Other | Admitting: *Deleted

## 2011-06-01 DIAGNOSIS — I5022 Chronic systolic (congestive) heart failure: Secondary | ICD-10-CM

## 2011-06-01 DIAGNOSIS — I428 Other cardiomyopathies: Secondary | ICD-10-CM

## 2011-06-03 LAB — REMOTE ICD DEVICE
ATRIAL PACING ICD: 0 pct
CHARGE TIME: 7.7 s
FVT: 0
MODE SWITCH EPISODES: 0
PACEART VT: 0
TZAT-0001FASTVT: 1
TZAT-0013SLOWVT: 3
TZAT-0018FASTVT: NEGATIVE
TZAT-0018FASTVT: NEGATIVE
TZAT-0018SLOWVT: NEGATIVE
TZAT-0018SLOWVT: NEGATIVE
TZON-0003FASTVT: 300 ms
TZST-0001FASTVT: 3
TZST-0001SLOWVT: 3
TZST-0001SLOWVT: 6
TZST-0001SLOWVT: 7
TZST-0003FASTVT: 31 J
TZST-0003FASTVT: 31 J
TZST-0003SLOWVT: 31 J
TZST-0003SLOWVT: 31 J
VENTRICULAR PACING ICD: 100 pct

## 2011-06-08 LAB — BLOOD GAS, ARTERIAL
Acid-Base Excess: 6 — ABNORMAL HIGH
TCO2: 27.7
pCO2 arterial: 52.3 — ABNORMAL HIGH
pO2, Arterial: 93.4

## 2011-06-08 LAB — CBC
HCT: 35.6 — ABNORMAL LOW
Platelets: 259
RDW: 15.8 — ABNORMAL HIGH
WBC: 6.8

## 2011-06-08 LAB — BASIC METABOLIC PANEL
BUN: 17
BUN: 5 — ABNORMAL LOW
CO2: 32
Calcium: 8.3 — ABNORMAL LOW
Chloride: 99
Creatinine, Ser: 0.76
Creatinine, Ser: 1.84 — ABNORMAL HIGH
GFR calc non Af Amer: 30 — ABNORMAL LOW
Glucose, Bld: 111 — ABNORMAL HIGH
Potassium: 3.7

## 2011-06-08 LAB — HEPATIC FUNCTION PANEL
Alkaline Phosphatase: 92
Bilirubin, Direct: 0.1
Indirect Bilirubin: 0.4
Total Bilirubin: 0.5

## 2011-06-08 LAB — CULTURE, BLOOD (ROUTINE X 2): Report Status: 6102009

## 2011-06-08 LAB — CULTURE, RESPIRATORY W GRAM STAIN

## 2011-06-08 LAB — B-NATRIURETIC PEPTIDE (CONVERTED LAB): Pro B Natriuretic peptide (BNP): <30

## 2011-06-08 LAB — VANCOMYCIN, TROUGH: Vancomycin Tr: 33.6

## 2011-06-08 LAB — EXPECTORATED SPUTUM ASSESSMENT W GRAM STAIN, RFLX TO RESP C

## 2011-06-08 LAB — DIFFERENTIAL
Basophils Absolute: 0
Eosinophils Relative: 2
Lymphocytes Relative: 24
Neutrophils Relative %: 67

## 2011-06-09 NOTE — Progress Notes (Signed)
ICD checked by remote. 

## 2011-06-12 LAB — POCT CARDIAC MARKERS
CKMB, poc: 1.2
Myoglobin, poc: 67

## 2011-06-12 LAB — B-NATRIURETIC PEPTIDE (CONVERTED LAB): Pro B Natriuretic peptide (BNP): <30

## 2011-06-12 LAB — CBC
MCHC: 33
RBC: 4.07
RDW: 14.6

## 2011-06-12 LAB — DIFFERENTIAL
Basophils Absolute: 0
Basophils Relative: 0
Lymphocytes Relative: 31
Monocytes Relative: 5
Neutro Abs: 4.4
Neutrophils Relative %: 61

## 2011-06-23 ENCOUNTER — Encounter: Payer: Self-pay | Admitting: *Deleted

## 2011-06-23 LAB — CBC
HCT: 33.2 — ABNORMAL LOW
Hemoglobin: 10.9 — ABNORMAL LOW
MCHC: 32.9
MCV: 87.8
RDW: 14.9 — ABNORMAL HIGH

## 2011-06-23 LAB — BLOOD GAS, ARTERIAL
Acid-Base Excess: 0.5
FIO2: 21
O2 Saturation: 96.4
pO2, Arterial: 84.6

## 2011-06-23 LAB — DIFFERENTIAL
Basophils Absolute: 0
Basophils Relative: 0
Eosinophils Absolute: 0.2
Eosinophils Relative: 3
Monocytes Absolute: 0.3

## 2011-06-23 LAB — BASIC METABOLIC PANEL
BUN: 8
Chloride: 102
Creatinine, Ser: 0.72
GFR calc non Af Amer: >60

## 2011-06-26 ENCOUNTER — Encounter (HOSPITAL_COMMUNITY): Payer: Medicare Other | Attending: Oncology

## 2011-06-26 DIAGNOSIS — D509 Iron deficiency anemia, unspecified: Secondary | ICD-10-CM

## 2011-06-26 LAB — CBC
HCT: 38.6 % (ref 36.0–46.0)
MCV: 91 fL (ref 78.0–100.0)
RDW: 14 % (ref 11.5–15.5)
WBC: 6.9 10*3/uL (ref 4.0–10.5)

## 2011-06-26 NOTE — Progress Notes (Signed)
Labs drawn today for cbc,ferr 

## 2011-06-27 ENCOUNTER — Emergency Department (HOSPITAL_COMMUNITY)
Admission: EM | Admit: 2011-06-27 | Discharge: 2011-06-27 | Disposition: A | Discharge status: Home / Self Care | Payer: Medicare Other | Attending: Emergency Medicine | Admitting: Emergency Medicine

## 2011-06-27 ENCOUNTER — Encounter (HOSPITAL_COMMUNITY): Payer: Self-pay | Admitting: *Deleted

## 2011-06-27 DIAGNOSIS — I059 Rheumatic mitral valve disease, unspecified: Secondary | ICD-10-CM | POA: Insufficient documentation

## 2011-06-27 DIAGNOSIS — M545 Low back pain, unspecified: Secondary | ICD-10-CM | POA: Insufficient documentation

## 2011-06-27 DIAGNOSIS — J45909 Unspecified asthma, uncomplicated: Secondary | ICD-10-CM | POA: Insufficient documentation

## 2011-06-27 DIAGNOSIS — IMO0002 Reserved for concepts with insufficient information to code with codable children: Secondary | ICD-10-CM | POA: Insufficient documentation

## 2011-06-27 DIAGNOSIS — M5416 Radiculopathy, lumbar region: Secondary | ICD-10-CM

## 2011-06-27 DIAGNOSIS — I10 Essential (primary) hypertension: Secondary | ICD-10-CM | POA: Insufficient documentation

## 2011-06-27 DIAGNOSIS — I509 Heart failure, unspecified: Secondary | ICD-10-CM | POA: Insufficient documentation

## 2011-06-27 DIAGNOSIS — I428 Other cardiomyopathies: Secondary | ICD-10-CM | POA: Insufficient documentation

## 2011-06-27 DIAGNOSIS — F17200 Nicotine dependence, unspecified, uncomplicated: Secondary | ICD-10-CM | POA: Insufficient documentation

## 2011-06-27 DIAGNOSIS — K219 Gastro-esophageal reflux disease without esophagitis: Secondary | ICD-10-CM | POA: Insufficient documentation

## 2011-06-27 DIAGNOSIS — K589 Irritable bowel syndrome without diarrhea: Secondary | ICD-10-CM | POA: Insufficient documentation

## 2011-06-27 LAB — CBC
HCT: 39.2 % (ref 36.0–46.0)
Hemoglobin: 12.8 g/dL (ref 12.0–15.0)
MCH: 29.4 pg (ref 26.0–34.0)
MCV: 90.1 fL (ref 78.0–100.0)
Platelets: 289 10*3/uL (ref 150–400)
RBC: 4.35 MIL/uL (ref 3.87–5.11)

## 2011-06-27 LAB — BASIC METABOLIC PANEL
BUN: 8 mg/dL (ref 6–23)
Calcium: 8.6 mg/dL (ref 8.4–10.5)
GFR calc non Af Amer: 88 mL/min — ABNORMAL LOW (ref >90)
Glucose, Bld: 107 mg/dL — ABNORMAL HIGH (ref 70–99)

## 2011-06-27 LAB — DIFFERENTIAL
Eosinophils Absolute: 0.1 10*3/uL (ref 0.0–0.7)
Eosinophils Relative: 1 % (ref 0–5)
Lymphs Abs: 2.5 10*3/uL (ref 0.7–4.0)
Monocytes Absolute: 0.4 10*3/uL (ref 0.1–1.0)
Monocytes Relative: 4 % (ref 3–12)

## 2011-06-27 LAB — URINE MICROSCOPIC-ADD ON

## 2011-06-27 LAB — URINALYSIS, ROUTINE W REFLEX MICROSCOPIC
Bilirubin Urine: NEGATIVE
Protein, ur: NEGATIVE mg/dL
Urobilinogen, UA: 0.2 mg/dL (ref 0.0–1.0)

## 2011-06-27 MED ORDER — PREDNISONE 50 MG PO TABS: 50.0000 mg | ORAL_TABLET | Freq: Every day | ORAL | Status: AC

## 2011-06-27 MED ORDER — PREDNISONE 20 MG PO TABS
60.0000 mg | ORAL_TABLET | Freq: Once | ORAL | Status: AC
  Administered 2011-06-27: 60 mg via ORAL
  Filled 2011-06-27: qty 3

## 2011-06-27 MED ORDER — OXYCODONE-ACETAMINOPHEN 5-325 MG PO TABS
1.0000 | ORAL_TABLET | Freq: Once | ORAL | Status: AC
  Administered 2011-06-27: 1 via ORAL
  Filled 2011-06-27: qty 1

## 2011-06-27 MED ORDER — HYDROCODONE-ACETAMINOPHEN 5-325 MG PO TABS: ORAL_TABLET | ORAL | Status: DC

## 2011-06-27 MED ORDER — IBUPROFEN 800 MG PO TABS: 800.0000 mg | ORAL_TABLET | Freq: Once | ORAL | Status: DC

## 2011-06-27 MED ORDER — OXYCODONE-ACETAMINOPHEN 5-325 MG PO TABS: ORAL_TABLET | ORAL | Status: DC

## 2011-06-27 NOTE — ED Provider Notes (Addendum)
History     CSN: [id number] Arrival date & time: 06/27/2011  6:36 PM   First MD Initiated Contact with Patient 06/27/11 1846      Chief Complaint  Patient presents with  . Back Pain    (Consider location/radiation/quality/duration/timing/severity/associated sxs/prior treatment) HPI Comments: Pt states she has had similar episodes of back pain over the years for which she has seen dr. [name].  He has prescribed flexeril with minimal improvement.  She usually does better with with prednisone and a narcotic.  She also sees dr. Gerilyn Pilgrim for lower extremity neuropathy.   No recent trauma.  No fever or chills.  No UTI sxs.  Patient is a 45 y.o. female presenting with back pain. The history is provided by the patient.  Back Pain  This is a recurrent problem. Episode onset: 2 weeks ago. The problem occurs constantly. The problem has been gradually worsening. The pain is associated with no known injury. The pain is present in the lumbar spine. The quality of the pain is described as aching. The pain does not radiate. The pain is at a severity of 10/10. The symptoms are aggravated by bending, twisting and certain positions. The pain is the same all the time. Pertinent negatives include no fever, no bladder incontinence, no dysuria, no paresthesias, no paresis, no tingling and no weakness. She has tried muscle relaxants for the symptoms. The treatment provided no relief. Risk factors include obesity.    Past Medical History  Diagnosis Date  . CHF (congestive heart failure)     chronic systolic CHF  . Nonischemic cardiomyopathy   . Mitral regurgitation     moderate to severe  . S/P cardiac catheterization 10/06    normal coronary arteries  . LBBB (left bundle branch block)   . HTN (hypertension)   . Asthma   . IBS (irritable bowel syndrome)     with primarily constipation  . Morbid obesity   . GERD (gastroesophageal reflux disease)   . IDA (iron deficiency anemia)     2o TO SB AVMS     Past Surgical History  Procedure Date  . Crdt-implantation 4/07    [employer]. remote- yes  . Appendectomy   . Cholecystectomy     biliary dyskinesia  . Mastectomy 2003    left, partial  . Knee arthroscopy 2005    left  . Small bowel enteroscopy MAY 2012 [facility] DR. GILLIAM    SB AVMS s/p APC  . Colonoscopy OCT 2010/SEP 2011  . Upper gastrointestinal endoscopy '08, '10, SEP 2011  . Small bowel enteroscopy SEP 2011 [facility]    NO AVMS  . Givens capsule study NOV 2010     NO AVMS  . Cardiac defibrillator placement     Family History  Problem Relation Age of Onset  . Coronary artery disease      premature CAD- no family Hx   . Colon cancer Neg Hx     no family Hx of polyps too    History  Substance Use Topics  . Smoking status: Current Everyday Smoker  . Smokeless tobacco: Not on file  . Alcohol Use: No    OB History    Grav Para Term Preterm Abortions TAB SAB Ect Mult Living                  Review of Systems  Constitutional: Negative for fever.  Genitourinary: Negative for bladder incontinence and dysuria.  Musculoskeletal: Positive for back pain.  Neurological: Negative  for tingling, weakness and paresthesias.    Allergies  Review of patient's allergies indicates no known allergies.  Home Medications   Current Outpatient Rx  Name Route Sig Dispense Refill  . ALBUTEROL SULFATE HFA 108 (90 BASE) MCG/ACT IN AERS Inhalation Inhale 2 puffs into the lungs every 6 (six) hours as needed. For shortness of breath    . BENAZEPRIL HCL 40 MG PO TABS Oral Take 40 mg by mouth daily.      Marland Kitchen CARVEDILOL PHOSPHATE 80 MG PO CP24 Oral Take 80 mg by mouth daily.      . CYCLOBENZAPRINE HCL 10 MG PO TABS Oral Take 10 mg by mouth 3 (three) times daily.     . IMPLANON McComb Subcutaneous Inject 1 each into the skin once.      Marland Kitchen GABAPENTIN 300 MG PO CAPS Oral Take 300 mg by mouth 3 (three) times daily. 1 capsule in am and 2 in pm     . ISOSORB DINITRATE-HYDRALAZINE  20-37.5 MG PO TABS Oral Take 1 tablet by mouth 3 (three) times daily.      . MUSCLE RUB 10-15 % EX CREA Topical Apply 1 application topically as needed. For rub     . OMEPRAZOLE 40 MG PO CPDR Oral Take 40 mg by mouth daily.      Marland Kitchen POTASSIUM CHLORIDE CRYS CR 20 MEQ PO TBCR Oral Take 20 mEq by mouth 4 (four) times daily.      Marland Kitchen PRENATABS FA PO TABS Oral Take 1 tablet by mouth daily.      . TORSEMIDE 20 MG PO TABS Oral Take 40 mg by mouth daily.      Marland Kitchen SPIRONOLACTONE 25 MG PO TABS Oral Take 12.5 mg by mouth daily.        BP 115/62  Pulse 83  Temp(Src) 99.5 F (37.5 C) (Oral)  Resp 20  SpO2 100%  Physical Exam  Nursing note and vitals reviewed. Constitutional: She is oriented to person, place, and time. She appears well-developed and well-nourished. No distress.  HENT:  Head: Normocephalic and atraumatic.  Eyes: EOM are normal.  Neck: Normal range of motion.  Cardiovascular: Normal rate, regular rhythm and normal heart sounds.   Pulmonary/Chest: Effort normal and breath sounds normal.  Abdominal: Soft. She exhibits no distension. There is no tenderness.  Musculoskeletal:       Lumbar back: She exhibits decreased range of motion and pain. She exhibits no bony tenderness, no swelling, no deformity, no laceration and no spasm.       Back:  Neurological: She is alert and oriented to person, place, and time.  Skin: Skin is warm and dry.  Psychiatric: She has a normal mood and affect. Judgment normal.    ED Course  Procedures (including critical care time)   Labs Reviewed  CBC  DIFFERENTIAL  BASIC METABOLIC PANEL   No results found.   No diagnosis found.    MDM          Worthy Rancher, PA 06/27/11 2051  Worthy Rancher, PA 07/26/11 [phone]

## 2011-06-27 NOTE — ED Notes (Signed)
Pt states has had prior sciatica  Diagnosis  And bulging disc.  Pt presents to the ED secondary to worsening back pain over the past 2 weeks.  Pt also has neuropathy of lower extremities.

## 2011-06-27 NOTE — ED Notes (Signed)
Pt c/o pain in her lower back off and on since June 12, 2011. Pt states that she has bulging disks.

## 2011-06-28 NOTE — ED Provider Notes (Signed)
Medical screening examination/treatment/procedure(s) were performed by non-physician practitioner and as supervising physician I was immediately available for consultation/collaboration.  Laray Anger, DO 06/28/11 [phone]

## 2011-07-27 NOTE — ED Provider Notes (Signed)
Medical screening examination/treatment/procedure(s) were performed by non-physician practitioner and as supervising physician I was immediately available for consultation/collaboration.   Lavonne Cass M Rania Prothero, DO 07/27/11 0738 

## 2011-08-24 ENCOUNTER — Other Ambulatory Visit (HOSPITAL_COMMUNITY): Payer: Medicare Other

## 2011-08-25 ENCOUNTER — Ambulatory Visit (HOSPITAL_COMMUNITY): Payer: Medicare Other | Admitting: Oncology

## 2011-08-31 ENCOUNTER — Encounter: Payer: Self-pay | Admitting: Internal Medicine

## 2011-08-31 ENCOUNTER — Other Ambulatory Visit: Payer: Self-pay | Admitting: Internal Medicine

## 2011-08-31 ENCOUNTER — Ambulatory Visit (INDEPENDENT_AMBULATORY_CARE_PROVIDER_SITE_OTHER): Payer: Medicare Other | Admitting: *Deleted

## 2011-08-31 DIAGNOSIS — I5022 Chronic systolic (congestive) heart failure: Secondary | ICD-10-CM

## 2011-09-07 LAB — REMOTE ICD DEVICE
AL AMPLITUDE: 1 mv
ATRIAL PACING ICD: 0 pct
BRDY-0002RA: 60 {beats}/min
CHARGE TIME: 8.2 s
DEV-0020ICD: NEGATIVE
DEVICE MODEL ICD: 208928
FVT: 0
HV IMPEDENCE: 41 Ohm
LV LEAD IMPEDENCE ICD: 993 Ohm
RV LEAD IMPEDENCE ICD: 600 Ohm
TZAT-0001SLOWVT: 2
TZAT-0013FASTVT: 1
TZAT-0013SLOWVT: 3
TZAT-0018FASTVT: NEGATIVE
TZAT-0018FASTVT: NEGATIVE
TZAT-0018SLOWVT: NEGATIVE
TZST-0001FASTVT: 3
TZST-0001FASTVT: 4
TZST-0001FASTVT: 7
TZST-0001SLOWVT: 3
TZST-0001SLOWVT: 4
TZST-0001SLOWVT: 7
TZST-0003FASTVT: 23 J
TZST-0003FASTVT: 31 J
TZST-0003FASTVT: 31 J
TZST-0003SLOWVT: 23 J
TZST-0003SLOWVT: 31 J
TZST-0003SLOWVT: 31 J
TZST-0003SLOWVT: 31 J

## 2011-09-08 NOTE — Progress Notes (Signed)
ICD remote 

## 2011-10-12 ENCOUNTER — Telehealth: Payer: Self-pay | Admitting: *Deleted

## 2011-10-12 MED ORDER — CARVEDILOL 25 MG PO TABS: 25.0000 mg | ORAL_TABLET | Freq: Two times a day (BID) | ORAL | Status: DC

## 2011-10-12 NOTE — Telephone Encounter (Signed)
I spoke with pharmacist, Bronson Curb, at [facility].  Due to Coreg CR 80 not being covered by her plan, medication was changed to Carvedilolol 25 mg bid. Mylo Red RN

## 2011-10-13 NOTE — Progress Notes (Signed)
Pt was No Show

## 2011-12-07 ENCOUNTER — Encounter: Payer: Self-pay | Admitting: Internal Medicine

## 2011-12-07 ENCOUNTER — Ambulatory Visit (INDEPENDENT_AMBULATORY_CARE_PROVIDER_SITE_OTHER): Payer: Medicare Other | Admitting: *Deleted

## 2011-12-07 DIAGNOSIS — I5022 Chronic systolic (congestive) heart failure: Secondary | ICD-10-CM

## 2011-12-07 DIAGNOSIS — I428 Other cardiomyopathies: Secondary | ICD-10-CM

## 2011-12-12 LAB — REMOTE ICD DEVICE
AL AMPLITUDE: 0.9 mv
AL IMPEDENCE ICD: 517 Ohm
ATRIAL PACING ICD: 0 pct
BATTERY VOLTAGE: 2.52 v
BRDY-0002RA: 60 {beats}/min
BRDY-0003RA: 120 {beats}/min
CHARGE TIME: 9.7 s
DEV-0020ICD: NEGATIVE
DEVICE MODEL ICD: 208928
FVT: 0
HV IMPEDENCE: 37 Ohm
LV LEAD IMPEDENCE ICD: 955 Ohm
MODE SWITCH EPISODES: 0
PACEART VT: 1
RV LEAD IMPEDENCE ICD: 549 Ohm
TOT-0006: 20121220000000
TZAT-0001FASTVT: 1
TZAT-0001FASTVT: 2
TZAT-0001SLOWVT: 1
TZAT-0001SLOWVT: 2
TZAT-0002FASTVT: NEGATIVE
TZAT-0013FASTVT: 1
TZAT-0013SLOWVT: 3
TZAT-0013SLOWVT: 3
TZAT-0018FASTVT: NEGATIVE
TZAT-0018FASTVT: NEGATIVE
TZAT-0018SLOWVT: NEGATIVE
TZAT-0018SLOWVT: NEGATIVE
TZON-0003FASTVT: 300 ms
TZON-0003SLOWVT: 342.9 ms
TZST-0001FASTVT: 3
TZST-0001FASTVT: 4
TZST-0001FASTVT: 5
TZST-0001FASTVT: 6
TZST-0001FASTVT: 7
TZST-0001SLOWVT: 3
TZST-0001SLOWVT: 4
TZST-0001SLOWVT: 5
TZST-0001SLOWVT: 6
TZST-0001SLOWVT: 7
TZST-0003FASTVT: 23 J
TZST-0003FASTVT: 31 J
TZST-0003FASTVT: 31 J
TZST-0003FASTVT: 31 J
TZST-0003FASTVT: 31 J
TZST-0003SLOWVT: 14 J
TZST-0003SLOWVT: 23 J
TZST-0003SLOWVT: 31 J
TZST-0003SLOWVT: 31 J
TZST-0003SLOWVT: 31 J
VENTRICULAR PACING ICD: 100 pct
VF: 0

## 2011-12-13 NOTE — Progress Notes (Signed)
Remote icd check w/icm  

## 2011-12-20 ENCOUNTER — Encounter: Payer: Self-pay | Admitting: *Deleted

## 2011-12-22 ENCOUNTER — Other Ambulatory Visit: Payer: Self-pay | Admitting: Internal Medicine

## 2011-12-22 NOTE — Telephone Encounter (Signed)
PT STATES THAT PHARMACY SAID THIS PILL WOULD BE $600, THE PHARMACY WAS GOING TO CONTACT us.   CAN SHE TAKE SOMETHING ELSE IN PLACE OF THIS?

## 2012-01-05 MED ORDER — ISOSORB DINITRATE-HYDRALAZINE 20-37.5 MG PO TABS: 1.0000 | ORAL_TABLET | Freq: Three times a day (TID) | ORAL | Status: DC

## 2012-01-30 ENCOUNTER — Emergency Department (HOSPITAL_COMMUNITY): Payer: Medicare Other

## 2012-01-30 ENCOUNTER — Encounter (HOSPITAL_COMMUNITY): Payer: Self-pay

## 2012-01-30 ENCOUNTER — Inpatient Hospital Stay (HOSPITAL_COMMUNITY)
Admission: EM | Admit: 2012-01-30 | Discharge: 2012-02-02 | DRG: 811 | Disposition: A | Discharge status: Home / Self Care | Payer: Medicare Other | Attending: Family Medicine | Admitting: Family Medicine

## 2012-01-30 DIAGNOSIS — I4510 Unspecified right bundle-branch block: Secondary | ICD-10-CM | POA: Diagnosis present

## 2012-01-30 DIAGNOSIS — I059 Rheumatic mitral valve disease, unspecified: Secondary | ICD-10-CM | POA: Diagnosis present

## 2012-01-30 DIAGNOSIS — E876 Hypokalemia: Secondary | ICD-10-CM | POA: Diagnosis present

## 2012-01-30 DIAGNOSIS — D638 Anemia in other chronic diseases classified elsewhere: Secondary | ICD-10-CM | POA: Diagnosis present

## 2012-01-30 DIAGNOSIS — K219 Gastro-esophageal reflux disease without esophagitis: Secondary | ICD-10-CM | POA: Diagnosis present

## 2012-01-30 DIAGNOSIS — J45909 Unspecified asthma, uncomplicated: Secondary | ICD-10-CM | POA: Diagnosis present

## 2012-01-30 DIAGNOSIS — D509 Iron deficiency anemia, unspecified: Principal | ICD-10-CM | POA: Diagnosis present

## 2012-01-30 DIAGNOSIS — I10 Essential (primary) hypertension: Secondary | ICD-10-CM | POA: Diagnosis present

## 2012-01-30 DIAGNOSIS — I5022 Chronic systolic (congestive) heart failure: Secondary | ICD-10-CM

## 2012-01-30 DIAGNOSIS — I428 Other cardiomyopathies: Secondary | ICD-10-CM | POA: Diagnosis present

## 2012-01-30 DIAGNOSIS — K5900 Constipation, unspecified: Secondary | ICD-10-CM | POA: Diagnosis not present

## 2012-01-30 DIAGNOSIS — Z6841 Body Mass Index (BMI) 40.0 and over, adult: Secondary | ICD-10-CM

## 2012-01-30 DIAGNOSIS — I509 Heart failure, unspecified: Secondary | ICD-10-CM | POA: Diagnosis present

## 2012-01-30 DIAGNOSIS — K5521 Angiodysplasia of colon with hemorrhage: Secondary | ICD-10-CM | POA: Diagnosis present

## 2012-01-30 DIAGNOSIS — R0609 Other forms of dyspnea: Secondary | ICD-10-CM

## 2012-01-30 DIAGNOSIS — G609 Hereditary and idiopathic neuropathy, unspecified: Secondary | ICD-10-CM | POA: Diagnosis present

## 2012-01-30 DIAGNOSIS — D649 Anemia, unspecified: Secondary | ICD-10-CM

## 2012-01-30 DIAGNOSIS — Z95810 Presence of automatic (implantable) cardiac defibrillator: Secondary | ICD-10-CM

## 2012-01-30 HISTORY — DX: Obstructive sleep apnea (adult) (pediatric): G47.33

## 2012-01-30 HISTORY — DX: Polyneuropathy, unspecified: G62.9

## 2012-01-30 HISTORY — DX: Arteriovenous malformation, site unspecified: Q27.30

## 2012-01-30 LAB — CBC
Platelets: 369 10*3/uL (ref 150–400)
RDW: 14.8 % (ref 11.5–15.5)
WBC: 8.3 10*3/uL (ref 4.0–10.5)

## 2012-01-30 LAB — BASIC METABOLIC PANEL
CO2: 32 mEq/L (ref 19–32)
Calcium: 9.2 mg/dL (ref 8.4–10.5)
Chloride: 96 mEq/L (ref 96–112)
Potassium: 3 mEq/L — ABNORMAL LOW (ref 3.5–5.1)
Sodium: 137 mEq/L (ref 135–145)

## 2012-01-30 LAB — DIFFERENTIAL
Basophils Absolute: 0 10*3/uL (ref 0.0–0.1)
Lymphocytes Relative: 32 % (ref 12–46)
Neutro Abs: 5.2 10*3/uL (ref 1.7–7.7)
Neutrophils Relative %: 63 % (ref 43–77)

## 2012-01-30 LAB — CARDIAC PANEL(CRET KIN+CKTOT+MB+TROPI)
Relative Index: INVALID (ref 0.0–2.5)
Total CK: 38 U/L (ref 7–177)
Troponin I: <0.3 ng/mL (ref <0.30)

## 2012-01-30 LAB — PRO B NATRIURETIC PEPTIDE: Pro B Natriuretic peptide (BNP): 114.1 pg/mL (ref 0–125)

## 2012-01-30 MED ORDER — BENAZEPRIL HCL 10 MG PO TABS
40.0000 mg | ORAL_TABLET | Freq: Every day | ORAL | Status: DC
  Administered 2012-01-31: 40 mg via ORAL
  Filled 2012-01-30: qty 4

## 2012-01-30 MED ORDER — TORSEMIDE 20 MG PO TABS
40.0000 mg | ORAL_TABLET | Freq: Every day | ORAL | Status: DC
  Administered 2012-01-31: 40 mg via ORAL
  Filled 2012-01-30 (×2): qty 1

## 2012-01-30 MED ORDER — CARVEDILOL 12.5 MG PO TABS
25.0000 mg | ORAL_TABLET | Freq: Once | ORAL | Status: AC
  Administered 2012-01-30: 25 mg via ORAL
  Filled 2012-01-30: qty 2

## 2012-01-30 MED ORDER — PRENATAL MULTIVITAMIN CH
1.0000 | ORAL_TABLET | Freq: Every morning | ORAL | Status: DC
  Administered 2012-01-31 – 2012-02-01 (×2): 1 via ORAL
  Filled 2012-01-30 (×3): qty 1

## 2012-01-30 MED ORDER — PANTOPRAZOLE SODIUM 40 MG IV SOLR
40.0000 mg | Freq: Once | INTRAVENOUS | Status: AC
  Administered 2012-01-30: 40 mg via INTRAVENOUS
  Filled 2012-01-30: qty 40

## 2012-01-30 MED ORDER — ASPIRIN 81 MG PO CHEW: 324.0000 mg | CHEWABLE_TABLET | Freq: Once | ORAL | Status: DC

## 2012-01-30 MED ORDER — ONDANSETRON HCL 4 MG/2ML IJ SOLN: 4.0000 mg | Freq: Three times a day (TID) | INTRAMUSCULAR | Status: AC | PRN

## 2012-01-30 MED ORDER — SODIUM CHLORIDE 0.9 % IV SOLN
8.0000 mg/h | INTRAVENOUS | Status: DC
  Administered 2012-01-30 – 2012-02-01 (×5): 8 mg/h via INTRAVENOUS
  Filled 2012-01-30 (×9): qty 80

## 2012-01-30 MED ORDER — FUROSEMIDE 10 MG/ML IJ SOLN: 60.0000 mg | Freq: Once | INTRAMUSCULAR | Status: DC

## 2012-01-30 MED ORDER — FUROSEMIDE 10 MG/ML IJ SOLN
20.0000 mg | Freq: Once | INTRAMUSCULAR | Status: AC
  Administered 2012-01-30: 20 mg via INTRAVENOUS
  Filled 2012-01-30: qty 2

## 2012-01-30 MED ORDER — ISOSORBIDE MONONITRATE ER 60 MG PO TB24
60.0000 mg | ORAL_TABLET | Freq: Three times a day (TID) | ORAL | Status: DC
  Administered 2012-01-30 – 2012-02-01 (×6): 60 mg via ORAL
  Filled 2012-01-30 (×6): qty 1

## 2012-01-30 MED ORDER — HYDROCODONE-ACETAMINOPHEN 5-325 MG PO TABS: 1.0000 | ORAL_TABLET | ORAL | Status: DC | PRN

## 2012-01-30 MED ORDER — POTASSIUM CHLORIDE CRYS ER 20 MEQ PO TBCR
20.0000 meq | EXTENDED_RELEASE_TABLET | Freq: Four times a day (QID) | ORAL | Status: DC
  Administered 2012-01-30 – 2012-02-01 (×9): 20 meq via ORAL
  Filled 2012-01-30 (×9): qty 1

## 2012-01-30 MED ORDER — CARVEDILOL 12.5 MG PO TABS
25.0000 mg | ORAL_TABLET | Freq: Two times a day (BID) | ORAL | Status: DC
  Administered 2012-01-30 – 2012-01-31 (×2): 25 mg via ORAL
  Filled 2012-01-30: qty 2

## 2012-01-30 MED ORDER — HYDRALAZINE HCL 25 MG PO TABS
100.0000 mg | ORAL_TABLET | Freq: Three times a day (TID) | ORAL | Status: DC
  Administered 2012-01-30 – 2012-02-01 (×3): 100 mg via ORAL
  Filled 2012-01-30: qty 3
  Filled 2012-01-30 (×2): qty 4
  Filled 2012-01-30: qty 1
  Filled 2012-01-30: qty 4

## 2012-01-30 MED ORDER — CYCLOBENZAPRINE HCL 10 MG PO TABS
10.0000 mg | ORAL_TABLET | Freq: Three times a day (TID) | ORAL | Status: DC
  Administered 2012-01-30 – 2012-02-01 (×7): 10 mg via ORAL
  Filled 2012-01-30 (×7): qty 1

## 2012-01-30 MED ORDER — PANTOPRAZOLE SODIUM 40 MG PO TBEC
40.0000 mg | DELAYED_RELEASE_TABLET | Freq: Every day | ORAL | Status: DC
  Filled 2012-01-30: qty 1

## 2012-01-30 MED ORDER — ALBUTEROL SULFATE HFA 108 (90 BASE) MCG/ACT IN AERS: 2.0000 | INHALATION_SPRAY | RESPIRATORY_TRACT | Status: DC | PRN

## 2012-01-30 MED ORDER — POTASSIUM CHLORIDE CRYS ER 20 MEQ PO TBCR
40.0000 meq | EXTENDED_RELEASE_TABLET | Freq: Once | ORAL | Status: AC
  Administered 2012-01-30: 40 meq via ORAL
  Filled 2012-01-30: qty 2

## 2012-01-30 MED ORDER — ACETAMINOPHEN 500 MG PO TABS: 500.0000 mg | ORAL_TABLET | ORAL | Status: DC | PRN

## 2012-01-30 MED ORDER — GABAPENTIN 300 MG PO CAPS
300.0000 mg | ORAL_CAPSULE | Freq: Three times a day (TID) | ORAL | Status: DC
  Administered 2012-01-30 – 2012-02-01 (×7): 300 mg via ORAL
  Filled 2012-01-30 (×7): qty 1

## 2012-01-30 MED ORDER — PANTOPRAZOLE SODIUM 40 MG IV SOLR
INTRAVENOUS | Status: AC
  Filled 2012-01-30: qty 80

## 2012-01-30 MED ORDER — DIPHENHYDRAMINE HCL 25 MG PO CAPS
25.0000 mg | ORAL_CAPSULE | Freq: Every day | ORAL | Status: DC
  Administered 2012-01-30 – 2012-02-01 (×3): 25 mg via ORAL
  Filled 2012-01-30 (×3): qty 1

## 2012-01-30 NOTE — ED Provider Notes (Signed)
History     CSN: [id number]  Arrival date & time 01/30/12  1646   First MD Initiated Contact with Patient 01/30/12 1721      Chief Complaint  Patient presents with  . Shortness of Breath    (Consider location/radiation/quality/duration/timing/severity/associated sxs/prior treatment) HPI Comments: History nonischemic cardiomyopathy with EF of 25% presenting with increased shortness of breath, dyspnea on exertion, PND orthopnea over the past 2 days. She denies any chest pain, fever, cough, congestion, increased leg swelling. NO nausea, vomiting, abdominal pain. States compliance with meds. She had a normal cardiac catheterization 2006. She is a patient of Dr. Daleen Squibb.  Her history on exertion is worse to the point tonight where she cannot walk more than 10 feet. She is short of breath with talking. She's having to sit more upright in usual.  The history is provided by the patient.    Past Medical History  Diagnosis Date  . CHF (congestive heart failure)     chronic systolic CHF  . Nonischemic cardiomyopathy   . Mitral regurgitation     moderate to severe  . S/P cardiac catheterization 10/06    normal coronary arteries  . LBBB (left bundle branch block)   . HTN (hypertension)   . Asthma   . IBS (irritable bowel syndrome)     with primarily constipation  . Morbid obesity   . GERD (gastroesophageal reflux disease)   . IDA (iron deficiency anemia)     2o TO SB AVMS    Past Surgical History  Procedure Date  . Crdt-implantation 4/07    [employer]. remote- yes  . Appendectomy   . Cholecystectomy     biliary dyskinesia  . Mastectomy 2003    left, partial  . Knee arthroscopy 2005    left  . Small bowel enteroscopy MAY 2012 [facility] DR. GILLIAM    SB AVMS s/p APC  . Colonoscopy OCT 2010/SEP 2011  . Upper gastrointestinal endoscopy '08, '10, SEP 2011  . Small bowel enteroscopy SEP 2011 [facility]    NO AVMS  . Givens capsule study NOV 2010     NO AVMS  . Cardiac  defibrillator placement     Family History  Problem Relation Age of Onset  . Coronary artery disease      premature CAD- no family Hx   . Colon cancer Neg Hx     no family Hx of polyps too    History  Substance Use Topics  . Smoking status: Current Everyday Smoker  . Smokeless tobacco: Not on file  . Alcohol Use: No    OB History    Grav Para Term Preterm Abortions TAB SAB Ect Mult Living                  Review of Systems  Constitutional: Positive for activity change and appetite change. Negative for fever.  HENT: Positive for congestion and rhinorrhea.   Respiratory: Positive for cough and shortness of breath. Negative for chest tightness.   Cardiovascular: Positive for leg swelling. Negative for chest pain.  Gastrointestinal: Negative for nausea, vomiting and abdominal pain.  Genitourinary: Negative for dysuria and hematuria.  Musculoskeletal: Negative for back pain.  Neurological: Negative for headaches.    Allergies  Review of patient's allergies indicates no known allergies.  Home Medications   Current Outpatient Rx  Name Route Sig Dispense Refill  . ALBUTEROL SULFATE HFA 108 (90 BASE) MCG/ACT IN AERS Inhalation Inhale 2 puffs into the lungs every 6 (six)  hours as needed. For shortness of breath    . BENAZEPRIL HCL 40 MG PO TABS Oral Take 40 mg by mouth daily.      Marland Kitchen CARVEDILOL 25 MG PO TABS Oral Take 1 tablet (25 mg total) by mouth 2 (two) times daily with a meal. 60 tablet 11  . CYCLOBENZAPRINE HCL 10 MG PO TABS Oral Take 10 mg by mouth 3 (three) times daily. For muscle spasms    . DIPHENHYDRAMINE HCL 25 MG PO TABS Oral Take 25 mg by mouth at bedtime.    Marland Kitchen GABAPENTIN 300 MG PO CAPS Oral Take 300 mg by mouth 3 (three) times daily. 1 capsule in am and 2 in pm     . HYDRALAZINE HCL 100 MG PO TABS Oral Take 100 mg by mouth 3 (three) times daily. For heart failure    . HYDROCODONE-ACETAMINOPHEN 5-325 MG PO TABS  One tab po q 4-6 hrs prn pain 20 tablet 0  .  ISOSORBIDE MONONITRATE ER 60 MG PO TB24 Oral Take 60 mg by mouth 3 (three) times daily. For heart failure    . OMEPRAZOLE 40 MG PO CPDR Oral Take 40 mg by mouth daily.      Marland Kitchen POTASSIUM CHLORIDE CRYS ER 20 MEQ PO TBCR Oral Take 20 mEq by mouth 4 (four) times daily.     Marland Kitchen PRENATABS RX PO Oral Take 1 tablet by mouth daily.    . TORSEMIDE 20 MG PO TABS Oral Take 40 mg by mouth daily. For heart failure    . IMPLANON [place] Subcutaneous Inject 1 each into the skin once.        BP 103/74  Pulse 89  Temp(Src) 99.3 F (37.4 C) (Oral)  Resp 25  Ht 5\' 6"  (1.676 m)  Wt 275 lb (124.739 kg)  BMI 44.39 kg/m2  SpO2 99%  Physical Exam  Constitutional: She is oriented to person, place, and time. She appears well-developed and well-nourished. No distress.       Mild dyspnea with talking  HENT:  Head: Normocephalic and atraumatic.  Mouth/Throat: Oropharynx is clear and moist. No oropharyngeal exudate.  Eyes: EOM are normal. Pupils are equal, round, and reactive to light.  Neck: Normal range of motion. JVD present.  Cardiovascular: Normal rate, regular rhythm and normal heart sounds.   No murmur heard. Pulmonary/Chest: Effort normal and breath sounds normal.       Decreased breath sounds bilaterally  Abdominal: Soft. There is no tenderness. There is no rebound and no guarding.  Musculoskeletal: Normal range of motion. She exhibits edema.       Trace pedal edema  Neurological: She is alert and oriented to person, place, and time. No cranial nerve deficit.  Skin: Skin is warm.    ED Course  Procedures (including critical care time)  Labs Reviewed  CBC - Abnormal; Notable for the following:    RBC 3.14 (*)    Hemoglobin 7.9 (*)    HCT 26.0 (*)    MCH 25.2 (*)    All other components within normal limits  BASIC METABOLIC PANEL - Abnormal; Notable for the following:    Potassium 3.0 (*)    Glucose, Bld 104 (*)    All other components within normal limits  DIFFERENTIAL  PRO B NATRIURETIC PEPTIDE   CARDIAC PANEL(CRET KIN+CKTOT+MB+TROPI)  D-DIMER, QUANTITATIVE  TYPE AND SCREEN  PREPARE RBC (CROSSMATCH)   Dg Chest Portable 1 View  01/30/2012  *RADIOLOGY REPORT*  Clinical Data: Shortness of breath.  PORTABLE  CHEST - 1 VIEW  Comparison: 09/12/2010.  Findings: The pacer wires and AICD are stable.  The heart is enlarged but unchanged.  No acute pulmonary findings.  The bony thorax is intact.  IMPRESSION: Stable cardiac enlargement. No acute pulmonary findings.  Original Report Authenticated By: P. Loralie Champagne, M.D.     1. Anemia   2. Dyspnea on exertion       MDM  Shortness of breath, dyspnea on exertion, increased PND or orthopnea. Exacerbation of chronic systolic heart failure  Chest x-ray without pulmonary edema. Noted drop in hemoglobin 12.7 to 7.9. Discussed with Dr. [name] patient has a known history of AV malformations and has had this in the past. We'll start PPI and plan admission for blood transfusion given tenuous volume status with history of heart failure.  EKG nonischemic, troponin negative, d-dimer negative. Dyspnea likely secondary to patient's profound anemia. Admission discussed with Dr. [name] who will consult GI as needed.   Date: 01/30/2012  Rate: 88  Rhythm: normal sinus rhythm  QRS Axis: normal  Intervals: QT prolonged  ST/T Wave abnormalities: nonspecific ST/T changes  Conduction Disutrbances:nonspecific intraventricular conduction delay  Narrative Interpretation: Paced rhythm with widened QRS, no acute ST changes  Old EKG Reviewed: unchanged  CRITICAL CARE Performed by: Glynn Octave   Total critical care time: 30  Critical care time was exclusive of separately billable procedures and treating other patients.  Critical care was necessary to treat or prevent imminent or life-threatening deterioration.  Critical care was time spent personally by me on the following activities: development of treatment plan with patient and/or surrogate as  well as nursing, discussions with consultants, evaluation of patient's response to treatment, examination of patient, obtaining history from patient or surrogate, ordering and performing treatments and interventions, ordering and review of laboratory studies, ordering and review of radiographic studies, pulse oximetry and re-evaluation of patient's condition.      Glynn Octave, MD 01/30/12 Jerene Bears

## 2012-01-30 NOTE — ED Notes (Signed)
Pt alert and oriented x 3. Skin warm and dry. Color pink. Breath sounds clear and equal bilaterally. Cardiac monitor showing NSR. Pt c/o shortness of breath especially with exertion for a few days. Pt also c/o feeling lightheaded when she stands up. Pt states that she passed out earlier today for a few seconds.

## 2012-01-30 NOTE — ED Notes (Signed)
Attempts to call report.  Nurse not available at this time.

## 2012-01-30 NOTE — ED Notes (Addendum)
Cardiac monitor showing NSR. Dr. [name] in to see patient and requests to be called when she is ready for admission.

## 2012-01-30 NOTE — ED Notes (Signed)
Note: Ambulance person rep will be at hospital in the morning to interogate pacer/defib  Report called to [place], Charity fundraiser  - transport to room 327

## 2012-01-30 NOTE — ED Notes (Signed)
I spoke with the representative from [employer] to notify him that the patient's pacemaker needs interrogating. He states that he is 2 hours away and requests to see if it can be done tomorrow morning. Dr. Manus Gunning notified and okay to wait until am.

## 2012-01-30 NOTE — ED Notes (Signed)
Complain of SOB for two days. Denies chest pain. States she was told to come here to be eval.

## 2012-01-31 ENCOUNTER — Encounter (HOSPITAL_COMMUNITY): Payer: Self-pay | Admitting: Urgent Care

## 2012-01-31 DIAGNOSIS — K922 Gastrointestinal hemorrhage, unspecified: Secondary | ICD-10-CM

## 2012-01-31 DIAGNOSIS — D649 Anemia, unspecified: Secondary | ICD-10-CM

## 2012-01-31 DIAGNOSIS — Z8719 Personal history of other diseases of the digestive system: Secondary | ICD-10-CM

## 2012-01-31 LAB — HEMOGLOBIN AND HEMATOCRIT, BLOOD: Hemoglobin: 8.9 g/dL — ABNORMAL LOW (ref 12.0–15.0)

## 2012-01-31 MED ORDER — POTASSIUM CHLORIDE 20 MEQ/15ML (10%) PO LIQD
40.0000 meq | Freq: Once | ORAL | Status: AC
  Administered 2012-01-31: 40 meq via ORAL
  Filled 2012-01-31: qty 30

## 2012-01-31 NOTE — Care Management Note (Unsigned)
    Page 1 of 1   01/31/2012     10:59:45 AM   CARE MANAGEMENT NOTE 01/31/2012  Patient:  Barbara, [name]   Account Number:  [id number]  Date Initiated:  01/31/2012  Documentation initiated by:  Sharrie Rothman  Subjective/Objective Assessment:   Pt admitted from home with a gi bleed. Pts daughter lives in the home with the pt. Pt fairly independent with ADL's PTA. Pt does have an aide with [employer] that comes out M-R for 1.5 hours a day.     Action/Plan:   No HH needs noted at this time. Will continue to monitor.   Anticipated DC Date:  02/06/2012   Anticipated DC Plan:  HOME/SELF CARE      DC Planning Services  CM consult      PAC Choice  Resumption Of Svcs/PTA Provider   Choice offered to / List presented to:  C-1 Patient        HH arranged  HH-8 PCS/PERSONAL CARE SERVICES      HH agency  [facility]   Status of service:  In process, will continue to follow Medicare Important Message given?   (If response is "NO", the following Medicare IM given date fields will be blank) Date Medicare IM given:   Date Additional Medicare IM given:    Discharge Disposition:    Per UR Regulation:    If discussed at Long Length of Stay Meetings, dates discussed:    Comments:  01/31/12 1058 Arlyss Queen, RN BSN CM

## 2012-01-31 NOTE — Consult Note (Signed)
Referring Provider: Dr [name] Primary Care Physician:  [name], MD, MD Primary Gastroenterologist:  Dr. Jonette Eva  Reason for Consultation:  Anemia  HPI: [name] is a 46 y.o. female admitted w/ SOB & weakness found to have anemia with hemoglobin 7.9.  She has been admitted w/ 2 units of PRBCs transfused w/lasix.  She has hx multiple endoscopic procedures (see below) with her last being small bowel double balloon enteroscopy with APC treatment to small bowel bleeding AVMS 01/2011 by Dr Gwinda Passe at [facility].  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  Began having SOB 2 days ago after walking to the neighbor's house.  Her nurse came to the house & she discussed w/ her w/ plans to come to doctor the next day.  Later, SOB worse, couldn't walk without severe dyspnea & pre-syncope, so called Dr [name] & she was sent to ER.  May 1 she was dx w/ hypokalemia & her potassium was increased. Has noticed dark stools that are "clingy" for several months. BM once daily.  Denies abdominal pain.  C/o nausea all the time.  No particular time of day.  Occurs at least 3 times per week.  Last about 1 hour then passes without vomiting.  Craving ice & ARGO starch (not eating). Weight stable, appetite good.  No NSAIDS or blood thinners.  Denies dysphagia or odynophagia.  Denies constipation or diarrhea.  Last parenteral iron was last year by Dr Mariel Sleet.  Pt admits to not following up with Dr Mariel Sleet due to lack of transportation since Jan 2013.  She was previously receiving twice monthly IV iron.      Past Medical History  Diagnosis Date  . CHF (congestive heart failure)     chronic systolic CHF,  . Nonischemic cardiomyopathy     EF 25%  . Mitral regurgitation     moderate to severe  . S/P cardiac catheterization 10/06    normal coronary arteries  . LBBB (left bundle branch  block)   . HTN (hypertension)   . Asthma   . IBS (irritable bowel syndrome)     with primarily constipation  . Morbid obesity   . GERD (gastroesophageal reflux disease)   . IDA (iron deficiency anemia)     2o TO SB AVMS, Hx parenteral iron Dr Mariel Sleet  . AVM (arteriovenous malformation)     Small bowel, s/p duble balloon enteroscopy/APC jejunum Dr Gwinda Passe [facility] [place]) 01/23/2011  . Anemia, chronic disease   . Peripheral neuropathy   . OSA (obstructive sleep apnea)   . GI bleed 06/2009    Hgb 7.5, ferritin 7, transfusion, iron IV    Past Surgical History  Procedure Date  . Crdt-implantation 4/07    [employer]. remote- yes  . Appendectomy   . Cholecystectomy     biliary dyskinesia  . Mastectomy 2003    left, partial  . Knee arthroscopy 2005    left  . Small bowel enteroscopy MAY 2012 [facility] DR. GILLIAM    SB AVMS s/p APC  . Colonoscopy OCT 2010/SEP 2011    tortuous colon, 3 polyps-benign(2010),   . Upper gastrointestinal endoscopy '08, '10, SEP 2011    mild antral gastritis (2010), negative SB bx (2010), incomlpete Schatzki's ring (2011)  . Small bowel enteroscopy SEP 2011 [facility]    Fields-NO AVMS  . Givens capsule study NOV 2010  NO AVMS, normal  . Cardiac defibrillator placement     Prior to Admission medications   Medication Sig Start Date End Date Taking? Authorizing Provider  albuterol (VENTOLIN HFA) 108 (90 BASE) MCG/ACT inhaler Inhale 2 puffs into the lungs every 6 (six) hours as needed. For shortness of breath   Yes Historical Provider, MD  benazepril (LOTENSIN) 40 MG tablet Take 40 mg by mouth daily.     Yes Historical Provider, MD  carvedilol (COREG) 25 MG tablet Take 1 tablet (25 mg total) by mouth 2 (two) times daily with a meal. 10/12/11 10/11/12 Yes Gaylord Shih, MD  cyclobenzaprine (FLEXERIL) 10 MG tablet Take 10 mg by mouth 3 (three) times daily. For muscle spasms   Yes Historical Provider, MD  diphenhydrAMINE (BENADRYL) 25 MG tablet Take 25 mg  by mouth at bedtime.   Yes Historical Provider, MD  gabapentin (NEURONTIN) 300 MG capsule Take 300 mg by mouth 3 (three) times daily. 1 capsule in am and 2 in pm    Yes Historical Provider, MD  hydrALAZINE (APRESOLINE) 100 MG tablet Take 100 mg by mouth 3 (three) times daily. For heart failure   Yes Historical Provider, MD  HYDROcodone-acetaminophen [facility]) 5-325 MG per tablet One tab po q 4-6 hrs prn pain 06/27/11  Yes Worthy Rancher, PA  isosorbide mononitrate (IMDUR) 60 MG 24 hr tablet Take 60 mg by mouth 3 (three) times daily. For heart failure   Yes Historical Provider, MD  omeprazole (PRILOSEC) 40 MG capsule Take 40 mg by mouth daily.     Yes Historical Provider, MD  potassium chloride SA (K-DUR,KLOR-CON) 20 MEQ tablet Take 20 mEq by mouth 4 (four) times daily.    Yes Historical Provider, MD  Prenatal Vit-Iron Carbonyl-FA (PRENATABS RX PO) Take 1 tablet by mouth daily.   Yes Historical Provider, MD  torsemide (DEMADEX) 20 MG tablet Take 40 mg by mouth daily. For heart failure   Yes Historical Provider, MD  Etonogestrel (IMPLANON [place]) Inject 1 each into the skin once.      Historical Provider, MD    Current Facility-Administered Medications  Medication Dose Route Frequency Provider Last Rate Last Dose  . acetaminophen (TYLENOL) tablet 500 mg  500 mg Oral Q4H PRN [name], MD      . albuterol (PROVENTIL HFA;VENTOLIN HFA) 108 (90 BASE) MCG/ACT inhaler 2 puff  2 puff Inhalation Q4H PRN [name], MD      . benazepril (LOTENSIN) tablet 40 mg  40 mg Oral Daily [name], MD      . carvedilol (COREG) tablet 25 mg  25 mg Oral BID WC [name], MD   25 mg at 01/30/12 2317  . carvedilol (COREG) tablet 25 mg  25 mg Oral Once [name], MD   25 mg at 01/30/12 2327  . cyclobenzaprine (FLEXERIL) tablet 10 mg  10 mg Oral TID [name], MD   10 mg at 01/30/12 2325  . diphenhydrAMINE (BENADRYL) capsule 25 mg  25 mg Oral QHS [name], MD   25  mg at 01/30/12 2315  . furosemide (LASIX) injection 20 mg  20 mg Intravenous Once [name], MD   20 mg at 01/30/12 2329  . gabapentin (NEURONTIN) capsule 300 mg  300 mg Oral TID [name], MD   300 mg at 01/30/12 2324  . hydrALAZINE (APRESOLINE) tablet 100 mg  100 mg Oral Q8H [name], MD   100 mg  at 01/31/12 0611  . HYDROcodone-acetaminophen (NORCO) 5-325 MG per tablet 1 tablet  1 tablet Oral Q4H PRN [name], MD      . isosorbide mononitrate (IMDUR) 24 hr tablet 60 mg  60 mg Oral TID [name], MD   60 mg at 01/30/12 2314  . ondansetron (ZOFRAN) injection 4 mg  4 mg Intravenous Q8H PRN Glynn Octave, MD      . pantoprazole (PROTONIX) 80 mg in sodium chloride 0.9 % 250 mL infusion  8 mg/hr Intravenous Continuous Glynn Octave, MD 25 mL/hr at 01/30/12 2036 8 mg/hr at 01/30/12 2036  . pantoprazole (PROTONIX) EC tablet 40 mg  40 mg Oral Q1200 [name], MD      . pantoprazole (PROTONIX) injection 40 mg  40 mg Intravenous Once Glynn Octave, MD   40 mg at 01/30/12 1828  . potassium chloride SA (K-DUR,KLOR-CON) CR tablet 20 mEq  20 mEq Oral QID [name], MD   20 mEq at 01/30/12 2326  . potassium chloride SA (K-DUR,KLOR-CON) CR tablet 40 mEq  40 mEq Oral Once Glynn Octave, MD   40 mEq at 01/30/12 2020  . Prenatabs Rx 29-1 MG TABS   Oral q morning - 10a [name], MD      . torsemide [facility]) tablet 40 mg  40 mg Oral Daily [name], MD      . DISCONTD: aspirin chewable tablet 324 mg  324 mg Oral Once Glynn Octave, MD      . DISCONTD: furosemide (LASIX) injection 60 mg  60 mg Intravenous Once Glynn Octave, MD        Allergies as of 01/30/2012  . (No Known Allergies)    Family History  Problem Relation Age of Onset  . Coronary artery disease      premature CAD- no family Hx   . Colon cancer Neg Hx     no family Hx of polyps too  . Cervical cancer Mother     History   Social History  .  Marital Status: Single    Spouse Name: [name]    Number of Children: 1  . Years of Education: [name]   Occupational History  . disabled    Social History Main Topics  . Smoking status: Current Everyday Smoker -- 0.5 packs/day for 20 years    Types: Cigarettes  . Smokeless tobacco: Not on file  . Alcohol Use: No  . Drug Use: No  . Sexually Active: Not on file   Other Topics Concern  . 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Single- 1 daughter age 55. Does not get regular exercise.    Review of Systems: Gen: Denies any fever, chills, sweats. See HPI. CV: Denies chest pain, angina, palpitations, orthopnea, PND, peripheral edema, and claudication. Resp: Denies cough, sputum, wheezing, coughing up blood, and pleurisy. GI: Denies vomiting blood, jaundice, and fecal incontinence.   GU : Denies urinary burning, blood in urine, urinary frequency, urinary hesitancy, nocturnal urination, and urinary incontinence. MS: Denies joint pain, limitation of movement, and swelling, stiffness, low back pain, extremity pain. Denies muscle weakness, cramps, atrophy.  Derm: Denies rash, itching, dry skin, hives, moles, warts, or unhealing ulcers.  Psych: Denies depression, anxiety, memory loss, suicidal ideation, hallucinations, paranoia, and confusion. Heme: Denies bruising and enlarged lymph nodes. Neuro:  Denies any headaches or paresthesias. Endo:  Denies any problems with DM, thyroid, adrenal function.  Physical Exam:  Vital signs in last 24 hours: Temp:  [98.1 F (36.7 C)-99.3 F (37.4 C)] 98.4 F (36.9 C) (05/22 0532) Pulse Rate:  [78-95] 79  (05/22 0532) Resp:  [16-25] 16  (05/22 0532) BP: (98-137)/(47-81) 118/80 mmHg (05/22 0532) SpO2:  [99 %-100 %] 99 % (05/21 1850) Weight:  [275 lb (124.739 kg)-275 lb 8 oz (124.966 kg)] 275 lb 8 oz (124.966 kg) (05/21 2152) Last BM Date: 01/30/12 General:   Alert,  morbidly obese, pleasant and cooperative in  NAD Head:  Normocephalic and atraumatic. Eyes:  Sclera clear, no icterus.   Conjunctiva pink. Ears:  Normal auditory acuity. Nose:  No deformity, discharge, or lesions. Mouth:  No deformity or lesions,oropharynx pink & moist. Neck:  Supple; no masses or thyromegaly. Lungs:  Clear throughout to auscultation.   No wheezes, crackles, or rhonchi. No acute distress. Heart:  Left anterior chest with defibrillator. Regular rate and rhythm; no murmurs, clicks, rubs,  or gallops. Abdomen:  Obese. Normal bowel sounds.  No bruits.  Soft, non-tender and non-distended without masses, hepatosplenomegaly or hernias noted.  No guarding or rebound tenderness.  Exam of the patient's body habitus. Rectal:  No external or internal lesions palpated. A small amount of medium brown stools positive. Msk:  Symmetrical without gross deformities. Pulses:  Normal pulses noted. Extremities:  No edema. Neurologic:  Alert and oriented x4;  grossly normal neurologically. Skin:  Intact without significant lesions or rashes. Lymph Nodes:  No significant cervical adenopathy. Psych:  Alert and cooperative. Normal mood and affect.  Intake/Output from previous day: 05/21 0701 - 05/22 0700 In: 2200 [I.V.:1500; Blood:700] Out: -  Intake/Output this shift:    Lab Results:  Basename 01/31/12 0612 01/30/12 1725  WBC -- 8.3  HGB 8.9* 7.9*  HCT 29.2* 26.0*  PLT -- 369   BMET  Basename 01/30/12 1725  NA 137  K 3.0*  CL 96  CO2 32  GLUCOSE 104*  BUN 9  CREATININE 0.77  CALCIUM 9.2   Studies/Results: Dg Chest Portable 1 View  01/30/2012  *RADIOLOGY REPORT*  Clinical Data: Shortness of breath.  PORTABLE CHEST - 1 VIEW  Comparison: 09/12/2010.  Findings: The pacer wires and AICD are stable.  The heart is enlarged but unchanged.  No acute pulmonary findings.  The bony thorax is intact.  IMPRESSION: Stable cardiac enlargement. No acute pulmonary findings.  Original Report Authenticated By: P. Loralie Champagne, M.D.     Impression: [name] is a pleasant 46 y.o. female with significant anemia and melena. 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 I suspect this is the source of her bleeding and anemia. Last treatment was by Dr. Gwinda Passe May 2012 with small bowel double balloon enteroscopy with APC.  She has ischemic cardiomyopathy and defibrillator. Hemoglobin has improved with 2 unit transfusion.    She also has hypokalemia being treated by Dr. [name]. Current potassium is 3. Plan: 1. Agree with transfusion to keep hemoglobin around 9 g, will monitor hemoglobin q6h x24hrs 2. She will need small bowel enteroscopy with possible intervention in the near future. Will discuss further with Dr. Jena Gauss. She is not a candidate for Givens capsule study given defibrillator. 3. Potassium repletion per Dr. [name] 4. Supportive measures   LOS: 1 day   Lorenza Burton  01/31/2012, 8:28 AM [facility]

## 2012-01-31 NOTE — Progress Notes (Signed)
UR chart review completed. Launi Asencio Crowder  

## 2012-01-31 NOTE — H&P (Signed)
[name], [name]               ACCOUNT NO.:  [id number]  MEDICAL RECORD NO.:  [id number]  LOCATION:  A327                          FACILITY:  [facility]  PHYSICIAN:  Mila Homer. [name], M.D.DATE OF BIRTH:  01-Aug-1966  DATE OF ADMISSION:  01/30/2012 DATE OF DISCHARGE:  LH                             HISTORY & PHYSICAL   This 46 year old called the office today around 4:30 p.m. to tell us that she had developed weakness, shortness of breath and general fatigue which had been going on for about 2 days.  It was recommended that she be seen at the [facility] emergency department immediately.  She has a long and complicated medical history. She has had nonischemic cardiomyopathy.  She has had an automatic implantable cardiac defibrillator due to low cardiac output.  She also has of coarse systolic heart failure.  She has in addition gastroesophageal reflux disease, mitral regurgitation, and peripheral neuropathy.  She has had at least 3 bouts of GI bleeding with anemia.  When her hemoglobin drops to a certain level she develops symptoms like she was having yesterday and today.  She is followed by [facility] for her cardiac condition and followed by [facility] for her GI problems.  She is also being followed by Dr. Glenford Peers, hematologist for her severe iron deficiency anemia.  Her anemia has been secondary to gastrointestinal blood losses and anemia of chronic disease.  She had a double balloon enteroscopy and argon plasma coagulation of bleeding lesions in the jejunum by Dr. Achilles Dunk at [facility] Jan 23, 2011.  She continues to take her usual medications, which include: 1. Albuterol inhaler 2 puffs q.6 hours as needed. 2. Benazepril 40 mg daily. 3. Cyclobenzaprine 10 mg t.i.d. for muscle spasm. 4. Gabapentin 300 mg, 1 capsule in a.m. and 2 in the p.m. 5. Omeprazole 40 mg daily. 6. Potassium chloride 20 mEq q.i.d. 7.  Torsemide 20 mg daily. 8. Carvedilol 25 mg b.i.d. 9. Diphenhydramine 25 mg at bedtime. 10.Implanon which was injected subcutaneously at an unknown time. 11.Hydralazine 100 mg t.i.d. 12.Hydrocodone/APAP 5/325 q.4-6 hours p.r.n. pain. 13.Isosorbide mononitrate 60 mg t.i.d. 14.Prenatal vitamins with iron daily.  PHYSICAL EXAMINATION:  GENERAL APPEARANCE:  A pleasant, obese, middle- aged woman who is in moderate distress. VITAL SIGNS:  Temperature is 99.3, pulse 89, respiratory rate 25, blood pressure 103/74. NEUROLOGIC:  Her speech was normal and her sensorium appeared to be intact. INTEGUMENT:  Her skin turgor was normal.  Mucous membranes moist. CARDIOVASCULAR:  Heart had a regular rhythm at a rate of about 90. CHEST:  Lungs appeared to be clear throughout, but with decreased breath sounds. ABDOMEN:  Soft without organomegaly, mass or tenderness. EXTREMITIES:  There is no edema of the ankles at this time.  LABORATORY DATA:  Her admission lab work showed a white cell count 8300 with a normal differential.  Hemoglobin was 7.9 and platelet count 369,000.  Her chemistry profile showed a potassium of 3.0.  Chest x-ray showed her pacer and AICD to be stable.  The heart was enlarged, but with no change in its size.  A 12 lead EKG showed a sinus rhythm and a right bundle  branch block. There seemed to be a septal infarct, lateral infarct, inferior infarct, and no change in her EKG compared to her last EKG of April 28, 2010.  ADMISSION DIAGNOSES: 1. Anemia, probably secondary to gastrointestinal bleeding. 2. Systolic heart failure. 3. Automatic implantable cardiac defibrillator placed secondary to     risks of ventricular fibrillation. 4. Anemia of chronic disease. 5. Morbid obesity. 6. Peripheral neuropathy. 7. Mitral regurgitation. She is receiving 2 units of packed red cells tonight with 20 mg of Lasix in between.  A repeat hemoglobin is to be done in the morning, and will see  gastroenterology in the morning for further workup of presumptive GI bleeding.     Mila Homer. [name], M.D.     SDK/MEDQ  D:  01/30/2012  T:  01/31/2012  Job:  [id number]

## 2012-01-31 NOTE — Plan of Care (Signed)
Problem: Consults Goal: GI Bleeding Patient Education See Patient Education Module for education specifics. Outcome: Progressing Pt admitted for anemia with hgb=7.9, Pt received 2 PRBC's. Pt has consult with Gastroenterology MD.   Problem: Phase I Progression Outcomes Goal: OOB as tolerated unless otherwise ordered Outcome: Progressing Pt has steady gait and is ambulatory. Goal: Voiding-avoid urinary catheter unless indicated Outcome: Progressing Pt has been been voiding per BRP Goal: Hemodynamically stable Outcome: Progressing Pt vital signs have been stable during PM shift.

## 2012-01-31 NOTE — Progress Notes (Signed)
NAMEPECOLIA, MARANDO               ACCOUNT NO.:  [id number]  MEDICAL RECORD NO.:  [id number]  LOCATION:  A327                          FACILITY:  [facility]  PHYSICIAN:  Mila Homer. [name], M.D.DATE OF BIRTH:  [birth year]  DATE OF PROCEDURE:  01/31/2012 DATE OF DISCHARGE:                                PROGRESS NOTE   SUBJECTIVE:  She feels somewhat better today.  She has now had 2 units of packed cells.  OBJECTIVE:  VITAL SIGNS:  Temperature is 98.4, pulse 79, respiratory rate 16, blood pressure 118/80. GENERAL:  She is semirecumbent in bed, in no acute distress, morbidly obese. HEART:  Regular rhythm.  Rate of about 80. LUNGS:  Appear to be clear throughout. EXTREMITIES:  She has no edema of the ankles.  LABORATORY:  Today's hemoglobin was 8.9, up from 7.9 yesterday.  ASSESSMENT: 1. Iron-deficiency anemia. 2. Arteriovenous malformations of the small intestine. 3. Cardiomyopathy. 4. Congestive heart failure, now with an automatic implantable cardiac     defibrillator. 5. Hypokalemia.  PLAN:  I talked to GI.  She required enteroscopy at [facility] last May to treat the AV malformations.  Apparently, she had no transportation to get her every-2-week iron infusions through Dr. Mariel Sleet and had stopped this back around January or February and conceivably she has had a slow bleed from the small intestine and no iron replacement as the cause of all this.  Will get another unit of blood into her today and then decide whether she will be treated here or will be transferred to [facility].  Potassium replacement.    Mila Homer. [name], M.D.    SDK/MEDQ  D:  01/31/2012  T:  01/31/2012  Job:  [id number]

## 2012-02-01 ENCOUNTER — Telehealth: Payer: Self-pay | Admitting: Gastroenterology

## 2012-02-01 DIAGNOSIS — D509 Iron deficiency anemia, unspecified: Secondary | ICD-10-CM

## 2012-02-01 DIAGNOSIS — R195 Other fecal abnormalities: Secondary | ICD-10-CM

## 2012-02-01 LAB — TYPE AND SCREEN
ABO/RH(D): A POS
Antibody Screen: NEGATIVE
Unit division: 0
Unit division: 0
Unit division: 0

## 2012-02-01 LAB — BASIC METABOLIC PANEL
BUN: 11 mg/dL (ref 6–23)
Calcium: 8.9 mg/dL (ref 8.4–10.5)
Chloride: 101 mEq/L (ref 96–112)
Creatinine, Ser: 0.9 mg/dL (ref 0.50–1.10)
GFR calc Af Amer: 88 mL/min — ABNORMAL LOW (ref >90)
GFR calc non Af Amer: 76 mL/min — ABNORMAL LOW (ref >90)

## 2012-02-01 LAB — CBC
HCT: 31.5 % — ABNORMAL LOW (ref 36.0–46.0)
MCH: 26.3 pg (ref 26.0–34.0)
MCHC: 31.1 g/dL (ref 30.0–36.0)
MCV: 84.5 fL (ref 78.0–100.0)
RDW: 14.9 % (ref 11.5–15.5)

## 2012-02-01 MED ORDER — DOCUSATE SODIUM 100 MG PO CAPS
100.0000 mg | ORAL_CAPSULE | Freq: Two times a day (BID) | ORAL | Status: DC
  Administered 2012-02-01 (×2): 100 mg via ORAL
  Filled 2012-02-01 (×2): qty 1

## 2012-02-01 MED ORDER — CARVEDILOL 12.5 MG PO TABS
12.5000 mg | ORAL_TABLET | Freq: Two times a day (BID) | ORAL | Status: DC
  Administered 2012-02-02: 12.5 mg via ORAL
  Filled 2012-02-01: qty 1

## 2012-02-01 MED ORDER — BISACODYL 10 MG RE SUPP: 10.0000 mg | Freq: Every day | RECTAL | Status: DC | PRN

## 2012-02-01 MED ORDER — PANTOPRAZOLE SODIUM 40 MG IV SOLR
INTRAVENOUS | Status: AC
  Filled 2012-02-01: qty 80

## 2012-02-01 MED ORDER — BENAZEPRIL HCL 10 MG PO TABS: 20.0000 mg | ORAL_TABLET | Freq: Every day | ORAL | Status: DC

## 2012-02-01 NOTE — Telephone Encounter (Signed)
Please arrange for outpatient f/u with Dr. Gwinda Passe. H/o sb avms and recent need for transfusion for occult GI bleed. May need double balloon push enteroscopy.

## 2012-02-01 NOTE — Progress Notes (Signed)
NAMEEDRIS, SCHNECK               ACCOUNT NO.:  [id number]  MEDICAL RECORD NO.:  [id number]  LOCATION:  A327                          FACILITY:  [facility]  PHYSICIAN:  Mila Homer. [name], M.D.DATE OF BIRTH:  06-03-1966  DATE OF PROCEDURE: DATE OF DISCHARGE:                                PROGRESS NOTE   SUBJECTIVE:  She still feels somewhat weak.  She has not had a bowel movement.  OBJECTIVE:  VITAL SIGNS:  Temperature is 98 degrees, pulse 80, respiratory rate 20, blood pressure 85/51.  She has had low blood pressures overnight. GENERAL:  She is somewhat recumbent in bed.  Her speech is normal. HEART:  Appears to have a regular rhythm, rate of 80. LUNGS:  Clear throughout. ABDOMEN:  Soft. EXTREMITIES:  There is no edema of the ankles.  White cell count today is 8800, and hemoglobin is up to 9.8.  Her BMP is normal with a potassium now at 4.2, up from 3.0.  ASSESSMENT: 1. Iron-deficiency anemia. 2. Chronic iron deficiency secondary to slow loss of blood from the     gastrointestinal system. 3. Probable arteriovenous malformations, bleeding. 4. Cardiomyopathy, now with an automatic implantable defibrillator. 5. Chronic systolic heart failure. 6. Constipation.  PLAN:  We will add Dulcolax suppository and Colace b.i.d.  I talked to the GI service.  She will either have a repeat workup of her GI system or be transferred either directly __________ or later in the next week as an outpatient to [facility] for review of her AV malformations in the small bowel.  This is still being decided.     Mila Homer. [name], M.D.     SDK/MEDQ  D:  02/01/2012  T:  02/01/2012  Job:  [id number]

## 2012-02-01 NOTE — Progress Notes (Signed)
Subjective:  No complaints. No over gi bleeding.   Objective: Vital signs in last 24 hours: Temp:  [98 F (36.7 C)-98.9 F (37.2 C)] 98.5 F (36.9 C) (05/23 1449) Pulse Rate:  [79-86] 86  (05/23 1449) Resp:  [18-20] 18  (05/23 1449) BP: (85-110)/(51-70) 110/69 mmHg (05/23 1449) SpO2:  [92 %-97 %] 97 % (05/23 1449) Last BM Date: 01/30/12 General:   Alert,  Well-developed, well-nourished, pleasant and cooperative in NAD Head:  Normocephalic and atraumatic. Eyes:  Sclera clear, no icterus.   Abdomen:  Soft, nontender and nondistended.     Neurologic:  Alert and  oriented x4;  grossly normal neurologically. Skin:  Intact without significant lesions or rashes. Psych:  Alert and cooperative. Normal mood and affect.  Intake/Output from previous day: 05/22 0701 - 05/23 0700 In: 1080.7 [P.O.:240; I.V.:840.7] Out: -  Intake/Output this shift: Total I/O In: 540 [P.O.:540] Out: -   Lab Results: CBC  Basename 02/01/12 0550 01/31/12 0612 01/30/12 1725  WBC 8.8 -- 8.3  HGB 9.8* 8.9* 7.9*  HCT 31.5* 29.2* 26.0*  MCV 84.5 -- 82.8  PLT 336 -- 369   BMET  Basename 02/01/12 0550 01/30/12 1725  NA 135 137  K 4.2 3.0*  CL 101 96  CO2 26 32  GLUCOSE 112* 104*  BUN 11 9  CREATININE 0.90 0.77  CALCIUM 8.9 9.2       Assessment: Anemia s/p 3 units of prbcs. 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 H/O parental iron but missed several appointments.   H/H stable s/p transfusion.  Plan: 1. Plan on outpatient f/u with Dr. Gwinda Passe for consideration of small bowel enteroscopy. If he recommends repeat EGD/TCS that can be done as well. Discussed with patient. No need for inpatient work-up at this time.    2. Patient stable from GI standpoint. Consider discharge. Patient wants to attend her child's fifth grade graduation at 10am in the morning.    LOS: 2 days   Tana Coast  02/01/2012, 2:53 PM   Agree with above.  No further inpatient GI evaluation planned here. We will  facilitate her referral back to [facility] as an outpatient in the near future.

## 2012-02-02 LAB — BASIC METABOLIC PANEL
CO2: 26 mEq/L (ref 19–32)
Calcium: 8.7 mg/dL (ref 8.4–10.5)
Creatinine, Ser: 0.88 mg/dL (ref 0.50–1.10)
GFR calc non Af Amer: 78 mL/min — ABNORMAL LOW (ref >90)

## 2012-02-02 NOTE — Telephone Encounter (Signed)
Pt scheduled to see Dr Merri Brunette on 06/10 @ 10:45- (Dr Gwinda Passe not available to end of August and pt has seen Dr Merri Brunette)

## 2012-02-02 NOTE — Discharge Instructions (Signed)

## 2012-02-02 NOTE — Progress Notes (Addendum)
IV removed, site WNL.  Pt given d/c instructions and new prescriptions.  Discussed home care with patient and discussed home medications, patient verbalizes understanding, teachback completed. F/U appointment to be made by pt, pt states they will make and keep appointment. Pt is stable at this time. Pt taken to main entrance in wheelchair by staff member. Pt states that Dr. Jena Gauss told her his office would make f/u appt with Dr. Gwinda Passe at [facility].

## 2012-02-03 NOTE — Discharge Summary (Signed)
NAMEDILCIA, RYBARCZYK               ACCOUNT NO.:  [id number]  MEDICAL RECORD NO.:  [id number]  LOCATION:  A327                          FACILITY:  [facility]  PHYSICIAN:  Mila Homer. [name], M.D.DATE OF BIRTH:  [birth year]  DATE OF ADMISSION:  01/30/2012 DATE OF DISCHARGE:  05/24/2013LH                              DISCHARGE SUMMARY   This 46 year old reported to the hospital with severe anemia and hemoglobin around 7.  She had a benign 4-day hospitalization extending from May 21 to Feb 02, 2012.  Vital signs remained stable.  Her admission white count was 8300 and hemoglobin was 7.9.  Her potassium was 3.0.  Cardiac enzymes were negative.  Recheck hemoglobins were 8.9 and 9.8,.  Recheck potassium was 4.2.  Admission chest x-ray showed pacer wires and AICD, but no acute abnormality.  EKG showed a sinus rhythm and right bundle branch block with what appeared to be infarcts in the septum and lateral inferior heart.  She was treated with the usual medications which included benazepril, carvedilol, cyclobenzaprine, diphenhydramine, docusate, gabapentin, hydralazine, isosorbide, potassium chloride, torsemide, prenatal multivitamin.  She is seen by the GI Service.  It was elected not to repeat EGD and TCS, which had been done in the fairly recent past.  Once her hemoglobin was stable, it was felt that she should could be discharged home and arrangements made for her to be seen by Dr. Gwinda Passe, at [facility], where she would have her small bowel again reevaluated for AVMs.  FINAL DISCHARGE DIAGNOSES: 1. Symptomatic iron-deficiency anemia. 2. Gastrointestinal bleeding probably from arteriovenous     malformations. 3. Systolic heart failure, chronic. 4. Peripheral neuropathy. 5. Morbid obesity. 6. Automatic implantable cardiac defibrillator use.  She will continue with her home meds as above which include: 1. Albuterol 2 puffs q.6 hours as needed. 2. Benazepril 40 mg daily. 3.  Carvedilol 25 mg b.i.d. 4. Cyclobenzaprine 10 mg t.i.d. for muscle spasm. 5. Diphenhydramine 25 mg q.h.s. 6. Gabapentin 300 mg 1 in the a.m., 2 in the p.m. 7. Hydralazine 100 mg t.i.d. 8. Hydrocodone/APAP 5/325 q.4-6 hours p.r.n. pain 9. Isosorbide mononitrate 60 mg t.i.d. 10.Omeprazole 40 mg daily,. 11.Potassium chloride 20 mEq q.i.d. 12.Torsemide 20 mg, 2 tablets daily. 13.Prenatal vitamin tablet daily.     Mila Homer. [name], M.D.     SDK/MEDQ  D:  02/02/2012  T:  02/03/2012  Job:  [id number]

## 2012-02-06 ENCOUNTER — Ambulatory Visit (INDEPENDENT_AMBULATORY_CARE_PROVIDER_SITE_OTHER): Payer: Medicare Other | Admitting: Internal Medicine

## 2012-02-06 ENCOUNTER — Encounter: Payer: Self-pay | Admitting: *Deleted

## 2012-02-06 ENCOUNTER — Encounter: Payer: Self-pay | Admitting: Internal Medicine

## 2012-02-06 VITALS — BP 133/90 | HR 95 | Resp 18 | Ht 66.0 in | Wt 282.0 lb

## 2012-02-06 DIAGNOSIS — D509 Iron deficiency anemia, unspecified: Secondary | ICD-10-CM

## 2012-02-06 DIAGNOSIS — Z01812 Encounter for preprocedural laboratory examination: Secondary | ICD-10-CM

## 2012-02-06 DIAGNOSIS — D649 Anemia, unspecified: Secondary | ICD-10-CM

## 2012-02-06 DIAGNOSIS — I5022 Chronic systolic (congestive) heart failure: Secondary | ICD-10-CM

## 2012-02-06 DIAGNOSIS — I509 Heart failure, unspecified: Secondary | ICD-10-CM

## 2012-02-06 DIAGNOSIS — Z95810 Presence of automatic (implantable) cardiac defibrillator: Secondary | ICD-10-CM

## 2012-02-06 LAB — APTT: aPTT: 37 seconds (ref 24–37)

## 2012-02-06 NOTE — Assessment & Plan Note (Signed)
Her symptoms are currently class II. She will continue her current medical therapy, maintain a low-sodium diet, and increase her physical activity.

## 2012-02-06 NOTE — Patient Instructions (Signed)
Your physician recommends that you schedule a follow-up appointment in: They will schedule your follow up after your Pacemaker change out.  Pacemaker generator change on Thursday June 6th

## 2012-02-06 NOTE — Progress Notes (Signed)
HPI [name] returns today for followup. She is a 46 year old woman with a nonischemic cardiomyopathy, left bundle branch block, chronic systolic heart failure, status post biventricular ICD implantation. The patient denies chest pain or shortness of breath. No peripheral edema. No ICD shock. No Known Allergies   Current Outpatient Prescriptions  Medication Sig Dispense Refill  . albuterol (VENTOLIN HFA) 108 (90 BASE) MCG/ACT inhaler Inhale 2 puffs into the lungs every 6 (six) hours as needed. For shortness of breath      . benazepril (LOTENSIN) 40 MG tablet Take 40 mg by mouth daily.        . carvedilol (COREG) 25 MG tablet Take 1 tablet (25 mg total) by mouth 2 (two) times daily with a meal.  60 tablet  11  . cyclobenzaprine (FLEXERIL) 10 MG tablet Take 10 mg by mouth 3 (three) times daily. For muscle spasms      . diphenhydrAMINE (BENADRYL) 25 MG tablet Take 25 mg by mouth at bedtime.      . Etonogestrel (IMPLANON [place]) Inject 1 each into the skin once.        . gabapentin (NEURONTIN) 300 MG capsule Take 300 mg by mouth 3 (three) times daily. 1 capsule in am and 2 in pm       . hydrALAZINE (APRESOLINE) 100 MG tablet Take 100 mg by mouth 3 (three) times daily. For heart failure      . HYDROcodone-acetaminophen (NORCO) 5-325 MG per tablet One tab po q 4-6 hrs prn pain  20 tablet  0  . isosorbide mononitrate (IMDUR) 60 MG 24 hr tablet Take 60 mg by mouth 3 (three) times daily. For heart failure      . omeprazole (PRILOSEC) 40 MG capsule Take 40 mg by mouth daily.        . potassium chloride SA (K-DUR,KLOR-CON) 20 MEQ tablet Take 20 mEq by mouth 4 (four) times daily.       . Prenatal Vit-Iron Carbonyl-FA (PRENATABS RX PO) Take 1 tablet by mouth daily.      Marland Kitchen torsemide (DEMADEX) 20 MG tablet Take 40 mg by mouth daily. For heart failure         Past Medical History  Diagnosis Date  . CHF (congestive heart failure)     chronic systolic CHF,  . Nonischemic cardiomyopathy     EF 25%  .  Mitral regurgitation     moderate to severe  . S/P cardiac catheterization 10/06    normal coronary arteries  . LBBB (left bundle branch block)   . HTN (hypertension)   . Asthma   . IBS (irritable bowel syndrome)     with primarily constipation  . Morbid obesity   . GERD (gastroesophageal reflux disease)   . IDA (iron deficiency anemia)     2o TO SB AVMS, Hx parenteral iron Dr Mariel Sleet  . AVM (arteriovenous malformation)     Small bowel, s/p duble balloon enteroscopy/APC jejunum Dr Gwinda Passe [facility]) 01/23/2011  . Anemia, chronic disease   . Peripheral neuropathy   . OSA (obstructive sleep apnea)   . GI bleed 06/2009    Hgb 7.5, ferritin 7, transfusion, iron IV    ROS:   All systems reviewed and negative except as noted in the HPI.   Past Surgical History  Procedure Date  . Crdt-implantation 4/07    [employer]. remote- yes  . Appendectomy   . Cholecystectomy     biliary dyskinesia  . Mastectomy 2003    left, partial  .  Knee arthroscopy 2005    left  . Small bowel enteroscopy MAY 2012 [facility]BE [facility] DR. GILLIAM    SB AVMS s/p APC  . Colonoscopy OCT 2010/SEP 2011    tortuous colon, 3 polyps-benign(2010),   . Upper gastrointestinal endoscopy '08, '10, SEP 2011    mild antral gastritis (2010), negative SB bx (2010), incomlpete Schatzki's ring (2011)  . Small bowel enteroscopy SEP 2011 [facility]    Fields-NO AVMS  . Givens capsule study NOV 2010     NO AVMS, normal  . Cardiac defibrillator placement      Family History  Problem Relation Age of Onset  . Coronary artery disease      premature CAD- no family Hx   . Colon cancer Neg Hx     no family Hx of polyps too  . Cervical cancer Mother      History   Social History  . Marital Status: Single    Spouse Name: [name]    Number of Children: 1  . Years of Education: [name]   Occupational History  . disabled    Social History Main Topics  . Smoking status: Current Everyday Smoker -- 0.5 packs/day for 20 years      Types: Cigarettes  . Smokeless tobacco: Not on file  . Alcohol Use: No  . Drug Use: No  . Sexually Active: Not on file   Other Topics Concern  . 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Single- 1 daughter age 50. Does not get regular exercise.       BP 133/90  Pulse 95  Resp 18  Ht 5\' 6"  (1.676 m)  Wt 282 lb (127.914 kg)  BMI 45.52 kg/m2  Physical Exam:  Well appearing 46 year old woman, morbidly obese, but in NAD HEENT: Unremarkable Neck:  No JVD, no thyromegally Lungs:  Clear with no wheezes, rales, or rhonchi. HEART:  Regular rate rhythm, no murmurs, no rubs, no clicks Abd:  soft, positive bowel sounds, no organomegally, no rebound, no guarding Ext:  2 plus pulses, no edema, no cyanosis, no clubbing Skin:  No rashes no nodules Neuro:  CN II through XII intact, motor grossly intact  DEVICE  Normal device function.  See PaceArt for details.   Assess/Plan:

## 2012-02-06 NOTE — Assessment & Plan Note (Signed)
This is thought to be due to AVMs of the small bowel. She denies any recent bleeding.

## 2012-02-06 NOTE — Assessment & Plan Note (Signed)
Her device has reached elective replacement. I've discussed the risks, goals, benefits, and expectations of ICD removal and insertion of a new device. This will be scheduled early as possible pending at time.

## 2012-02-07 ENCOUNTER — Encounter: Payer: Self-pay | Admitting: Internal Medicine

## 2012-02-07 LAB — CBC
HCT: 32 % — ABNORMAL LOW (ref 36.0–46.0)
Hemoglobin: 10.5 g/dL — ABNORMAL LOW (ref 12.0–15.0)
MCH: 25.9 pg — ABNORMAL LOW (ref 26.0–34.0)
MCHC: 32.8 g/dL (ref 30.0–36.0)
MCV: 78.8 fL (ref 78.0–100.0)
Platelets: 434 10*3/uL — ABNORMAL HIGH (ref 150–400)
RBC: 4.06 MIL/uL (ref 3.87–5.11)
RDW: 15.5 % (ref 11.5–15.5)
WBC: 8.3 10*3/uL (ref 4.0–10.5)

## 2012-02-07 LAB — BASIC METABOLIC PANEL
BUN: 11 mg/dL (ref 6–23)
CO2: 29 mEq/L (ref 19–32)
Calcium: 8.7 mg/dL (ref 8.4–10.5)
Chloride: 100 mEq/L (ref 96–112)
Creat: 0.83 mg/dL (ref 0.50–1.10)
Glucose, Bld: 92 mg/dL (ref 70–99)
Potassium: 4.3 mEq/L (ref 3.5–5.3)
Sodium: 140 mEq/L (ref 135–145)

## 2012-02-08 ENCOUNTER — Encounter (HOSPITAL_COMMUNITY): Payer: Self-pay | Admitting: Pharmacist

## 2012-02-14 ENCOUNTER — Other Ambulatory Visit: Payer: Self-pay | Admitting: *Deleted

## 2012-02-14 DIAGNOSIS — I428 Other cardiomyopathies: Secondary | ICD-10-CM

## 2012-02-15 ENCOUNTER — Ambulatory Visit (HOSPITAL_COMMUNITY)
Admission: RE | Admit: 2012-02-15 | Discharge: 2012-02-15 | Disposition: A | Discharge status: Home / Self Care | Payer: Medicare Other | Source: Ambulatory Visit | Attending: Internal Medicine | Admitting: Internal Medicine

## 2012-02-15 ENCOUNTER — Ambulatory Visit (HOSPITAL_COMMUNITY): Payer: Medicare Other

## 2012-02-15 ENCOUNTER — Encounter (HOSPITAL_COMMUNITY)
Admission: RE | Disposition: A | Discharge status: Home / Self Care | Payer: Self-pay | Source: Ambulatory Visit | Attending: Internal Medicine

## 2012-02-15 DIAGNOSIS — I509 Heart failure, unspecified: Secondary | ICD-10-CM | POA: Insufficient documentation

## 2012-02-15 DIAGNOSIS — I428 Other cardiomyopathies: Secondary | ICD-10-CM | POA: Insufficient documentation

## 2012-02-15 DIAGNOSIS — K219 Gastro-esophageal reflux disease without esophagitis: Secondary | ICD-10-CM | POA: Insufficient documentation

## 2012-02-15 DIAGNOSIS — I10 Essential (primary) hypertension: Secondary | ICD-10-CM | POA: Insufficient documentation

## 2012-02-15 DIAGNOSIS — I5022 Chronic systolic (congestive) heart failure: Secondary | ICD-10-CM | POA: Insufficient documentation

## 2012-02-15 DIAGNOSIS — I447 Left bundle-branch block, unspecified: Secondary | ICD-10-CM | POA: Insufficient documentation

## 2012-02-15 DIAGNOSIS — Z4502 Encounter for adjustment and management of automatic implantable cardiac defibrillator: Secondary | ICD-10-CM | POA: Insufficient documentation

## 2012-02-15 HISTORY — PX: IMPLANTABLE CARDIOVERTER DEFIBRILLATOR GENERATOR CHANGE: SHX5474

## 2012-02-15 LAB — SURGICAL PCR SCREEN
MRSA, PCR: NEGATIVE
Staphylococcus aureus: POSITIVE — AB

## 2012-02-15 SURGERY — IMPLANTABLE CARDIOVERTER DEFIBRILLATOR GENERATOR CHANGE

## 2012-02-15 MED ORDER — MIDAZOLAM HCL 2 MG/2ML IJ SOLN
INTRAMUSCULAR | Status: AC
  Filled 2012-02-15: qty 2

## 2012-02-15 MED ORDER — HEPARIN (PORCINE) IN NACL 2-0.9 UNIT/ML-% IJ SOLN
INTRAMUSCULAR | Status: AC
  Filled 2012-02-15: qty 1000

## 2012-02-15 MED ORDER — ONDANSETRON HCL 4 MG/2ML IJ SOLN: 4.0000 mg | Freq: Four times a day (QID) | INTRAMUSCULAR | Status: DC | PRN

## 2012-02-15 MED ORDER — HYDROCODONE-ACETAMINOPHEN 5-325 MG PO TABS: 1.0000 | ORAL_TABLET | ORAL | Status: DC | PRN

## 2012-02-15 MED ORDER — FENTANYL CITRATE 0.05 MG/ML IJ SOLN
INTRAMUSCULAR | Status: AC
  Filled 2012-02-15: qty 2

## 2012-02-15 MED ORDER — ACETAMINOPHEN 325 MG PO TABS: 325.0000 mg | ORAL_TABLET | ORAL | Status: DC | PRN

## 2012-02-15 MED ORDER — MIDAZOLAM HCL 2 MG/2ML IJ SOLN
INTRAMUSCULAR | Status: AC
  Filled 2012-02-15: qty 4

## 2012-02-15 MED ORDER — CHLORHEXIDINE GLUCONATE 4 % EX LIQD: 60.0000 mL | Freq: Once | CUTANEOUS | Status: DC

## 2012-02-15 MED ORDER — SODIUM CHLORIDE 0.45 % IV SOLN: INTRAVENOUS | Status: DC

## 2012-02-15 MED ORDER — NITROGLYCERIN 0.2 MG/ML ON CALL CATH LAB
INTRAVENOUS | Status: AC
  Filled 2012-02-15: qty 2

## 2012-02-15 MED ORDER — GENTAMICIN SULFATE 40 MG/ML IJ SOLN
80.0000 mg | INTRAMUSCULAR | Status: DC
  Filled 2012-02-15: qty 2

## 2012-02-15 MED ORDER — MUPIROCIN 2 % EX OINT
TOPICAL_OINTMENT | CUTANEOUS | Status: AC
  Administered 2012-02-15: 1 via NASAL
  Filled 2012-02-15: qty 22

## 2012-02-15 MED ORDER — CEFAZOLIN SODIUM-DEXTROSE 2-3 GM-% IV SOLR
2.0000 g | INTRAVENOUS | Status: DC
  Filled 2012-02-15 (×2): qty 50

## 2012-02-15 NOTE — Op Note (Signed)
ICD generator removal/insertion of a new device with pocket revision and ICD DFT testing without immediate complication.W#[id number].

## 2012-02-15 NOTE — Interval H&P Note (Signed)
History and Physical Interval Note: Since prior clinic visit, no change in the history, physical exam, assessment and plan.  02/15/2012 7:15 AM  [name]  has presented today for surgery, with the diagnosis of End of life  The various methods of treatment have been discussed with the patient and family. After consideration of risks, benefits and other options for treatment, the patient has consented to  Procedure(s) (LRB): PACEMAKER GENERATOR CHANGE ([name]) as a surgical intervention .  The patients' history has been reviewed, patient examined, no change in status, stable for surgery.  I have reviewed the patients' chart and labs.  Questions were answered to the patient's satisfaction.     [name]

## 2012-02-15 NOTE — Discharge Instructions (Signed)
Pacemaker Battery Change A pacemaker battery usually lasts 4 to 12 years. Once or twice per year, you will be asked to visit your caregiver to have a full evaluation of your pacemaker. When a battery needs to be replaced, the entire pacemaker is actually replaced so that you can benefit from new circuitry and any new features that have recently been added to pacemakers. Most often, this procedure is very simple because the leads are already in place. After giving medicine to numb the skin, your health care provider makes a cut to reopen the pocket holding the pacemaker and disconnects the old device from its leads. The leads are routinely tested at this time. If they are working okay, the new pacemaker may simply be connected to the existing leads. If there is any problem with the old lead system, it may be wise to replace the lead system while inserting the new pacemaker. There are many things that affect how [name] a pacemaker battery will last:  Age of the pacemaker.   Number of leads (1, 2 or 3).   Pacemaker work load. If the pacemaker is helping the heart more often, then the battery will not last as [name] as if the pacemaker does not need to help the heart.   Resistance of the leads. The greater the resistance, the greater the drain on the battery. This can increase as the leads get older or if one or more of the leads does not have the best contact with the heart.   Power (voltage) settings.   The health of the person's heart. If the health of the heart gets worse, then the pacemaker may have to work more often and the setting changed to accommodate these changes.  Your health care provider will be alerted to the fact that it is time to replace the battery during follow-up exams. He or she will check your pacemaker using a small table-top computer, called a programmer, and a wand. The wand is about the same size as a remote control. Your provider puts the wand on your body in the area where the  pacemaker is located. Information from the pacemaker is received about how well your heart is working and the status of the battery. It is not painful, and it usually takes just a few minutes. You will have plenty of time before the battery is fully used up to plan for replacement.  LET YOUR CAREGIVER KNOW ABOUT:   Symptoms of chest pain, trouble breathing, palpitations, lightheadedness, or feelings of an abnormal or irregular heart beat.   Allergies.   Medications taken including herbs, eye drops, over the counter medications, and creams   Use of steroids (by mouth or creams).   Possible pregnancy, if applicable.   Previous problems with anesthetics or Novocaine.   History of blood clots (thrombophlebitis).   History of bleeding or blood problems.   Surgery since your last pacemaker placement.   Other health problems.  RISKS AND COMPLICATIONS These are very uncommon but include:  Bleeding.   Bruising of the skin around where the incision was made.   Pain at the site of the incision.   Pulling apart of the skin at the incision site.   Infection.   Allergic reaction to anesthetics or medicines used during the procedure.  Diabetics may have a temporary increase in their blood sugar after any surgical procedure.  BEFORE THE PROCEDURE  Wash all of the skin around the area of the chest where the pacemaker is located.   Try to remove any loose, scaling skin. Unless advised otherwise, avoid using aspirin, ibuprofen, or naproxen for 3-4 days before the procedure. Ask your caregiver for help with any other medication adjustments before the pacemaker is replaced. Unless advised otherwise, do not eat or drink after midnight on the night before the procedure EXCEPT for drinking water and taking your medications as you normally would. AFTER THE PROCEDURE   A heart monitor and the pacemaker programmer will be used to make sure that the new pacemaker is working properly.   You can go home  after the procedure.   Your caregiver will advise you if you need to have any stitches. They will be removed 5-7 days after the procedure.  HOME CARE INSTRUCTIONS   Keep the incision clean and dry.   Unless advised otherwise, you may shower after carefully covering the incision with plastic wrap that is taped to your chest.   For the first week after the replacement, avoid stretching motions that pull at the incision site and avoid heavy exercise with the arm on the same side as the incision.   Only take over-the-counter or prescription medicines for pain, discomfort, or fever as directed by your caregiver.   Your caregiver will tell you when you will need to next test your pacemaker by telephone or when to return to the office for re-exam and/or removal of stitches, if necessary.  SEEK MEDICAL CARE IF:   You have unusual pain at the incision site that is not adequately helped by over-the-counter or prescription medicine.   There is drainage or pus from the incision site.   You develop red streaking that extends above or below the incision site.   You feel brief intermittent palpitations, lightheadedness or any symptoms that you feel might be related to your heart.  SEEK IMMEDIATE MEDICAL CARE IF:   You experience chest pain that is different than the pain at the incision site.   You experience:   Shortness of breath.   Palpitations.   Irregular heart beat.   Lightheadedness that does not go away quickly.   Fainting.   You develop a fever.   You have pain that gets worse even though you are taking pain medicine.  MAKE SURE YOU:   Understand these instructions.   Will watch your condition.   Will get help right away if you are not doing well or get worse.  Document Released: 12/06/2006 Document Revised: 08/17/2011 Document Reviewed: 03/11/2007 [employer] Patient Information 2012 [employer]. 

## 2012-02-15 NOTE — H&P (View-Only) (Signed)
HPI [name] returns today for followup. [name] with a nonischemic cardiomyopathy, left bundle branch block, chronic systolic heart failure, status post biventricular ICD implantation. The patient denies chest pain or shortness of breath. No peripheral edema. No ICD shock. No Known Allergies   Current Outpatient Prescriptions  Medication Sig Dispense Refill  . albuterol (VENTOLIN HFA) 108 (90 BASE) MCG/ACT inhaler Inhale 2 puffs into the lungs every 6 (six) hours as needed. For shortness of breath      . benazepril (LOTENSIN) 40 MG tablet Take 40 mg by mouth daily.        . carvedilol (COREG) 25 MG tablet Take 1 tablet (25 mg total) by mouth 2 (two) times daily with a meal.  60 tablet  11  . cyclobenzaprine (FLEXERIL) 10 MG tablet Take 10 mg by mouth 3 (three) times daily. For muscle spasms      . diphenhydrAMINE (BENADRYL) 25 MG tablet Take 25 mg by mouth at bedtime.      . Etonogestrel (IMPLANON [place]) Inject 1 each into the skin once.        . gabapentin (NEURONTIN) 300 MG capsule Take 300 mg by mouth 3 (three) times daily. 1 capsule in am and 2 in pm       . hydrALAZINE (APRESOLINE) 100 MG tablet Take 100 mg by mouth 3 (three) times daily. For heart failure      . HYDROcodone-acetaminophen (NORCO) 5-325 MG per tablet One tab po q 4-6 hrs prn pain  20 tablet  0  . isosorbide mononitrate (IMDUR) 60 MG 24 hr tablet Take 60 mg by mouth 3 (three) times daily. For heart failure      . omeprazole (PRILOSEC) 40 MG capsule Take 40 mg by mouth daily.        . potassium chloride SA (K-DUR,KLOR-CON) 20 MEQ tablet Take 20 mEq by mouth 4 (four) times daily.       . Prenatal Vit-Iron Carbonyl-FA (PRENATABS RX PO) Take 1 tablet by mouth daily.      . torsemide (DEMADEX) 20 MG tablet Take 40 mg by mouth daily. For heart failure         Past Medical History  Diagnosis Date  . CHF (congestive heart failure)     chronic systolic CHF,  . Nonischemic cardiomyopathy     EF 25%  .  Mitral regurgitation     moderate to severe  . S/P cardiac catheterization 10/06    normal coronary arteries  . LBBB (left bundle branch block)   . HTN (hypertension)   . Asthma   . IBS (irritable bowel syndrome)     with primarily constipation  . Morbid obesity   . GERD (gastroesophageal reflux disease)   . IDA (iron deficiency anemia)     2o TO SB AVMS, Hx parenteral iron Dr Neijstrom  . AVM (arteriovenous malformation)     Small bowel, s/p duble balloon enteroscopy/APC jejunum Dr Gilliam ([facility]) 01/23/2011  . Anemia, chronic disease   . Peripheral neuropathy   . OSA (obstructive sleep apnea)   . GI bleed 06/2009    Hgb 7.5, ferritin 7, transfusion, iron IV    ROS:   All systems reviewed and negative except as noted in the HPI.   Past Surgical History  Procedure Date  . Crdt-implantation 4/07    [employer]. remote- yes  . Appendectomy   . Cholecystectomy     biliary dyskinesia  . Mastectomy 2003    left, partial  .   Knee arthroscopy 2005    left  . Small bowel enteroscopy MAY 2012 DBE [facility] DR. GILLIAM    SB AVMS s/p APC  . Colonoscopy OCT 2010/SEP 2011    tortuous colon, 3 polyps-benign(2010),   . Upper gastrointestinal endoscopy '08, '10, SEP 2011    mild antral gastritis (2010), negative SB bx (2010), incomlpete Schatzki's ring (2011)  . Small bowel enteroscopy SEP 2011 [facility]    Fields-NO AVMS  . Givens capsule study NOV 2010     NO AVMS, normal  . Cardiac defibrillator placement      Family History  Problem Relation Age of Onset  . Coronary artery disease      premature CAD- no family Hx   . Colon cancer Neg Hx     no family Hx of polyps too  . Cervical cancer Mother      History   Social History  . Marital Status: Single    Spouse Name: [name]    Number of Children: 1  . Years of Education: [name]   Occupational History  . disabled    Social History Main Topics  . Smoking status: Current Everyday Smoker -- 0.5 packs/day for 20 years      Types: Cigarettes  . Smokeless tobacco: Not on file  . Alcohol Use: No  . Drug Use: No  . Sexually Active: Not on file   Other Topics Concern  . 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Single- 1 daughter age 11. Does not get regular exercise.       BP 133/90  Pulse 95  Resp 18  Ht 5' 6" (1.676 m)  Wt 282 lb (127.914 kg)  BMI 45.52 kg/m2  Physical Exam:  Well appearing 46-year-old woman, morbidly obese, but in NAD HEENT: Unremarkable Neck:  No JVD, no thyromegally Lungs:  Clear with no wheezes, rales, or rhonchi. HEART:  Regular rate rhythm, no murmurs, no rubs, no clicks Abd:  soft, positive bowel sounds, no organomegally, no rebound, no guarding Ext:  2 plus pulses, no edema, no cyanosis, no clubbing Skin:  No rashes no nodules Neuro:  CN II through XII intact, motor grossly intact  DEVICE  Normal device function.  See PaceArt for details.   Assess/Plan:   

## 2012-02-15 NOTE — Op Note (Signed)
[name], [name]               ACCOUNT NO.:  [id number]  MEDICAL RECORD NO.:  [id number]  LOCATION:  [facility]                         FACILITY:  [facility]  PHYSICIAN:  Doylene Canning. [name], MD    DATE OF BIRTH:  1966-06-23  DATE OF PROCEDURE:  02/15/2012 DATE OF DISCHARGE:                              OPERATIVE REPORT   PROCEDURE PERFORMED:  Removal of previously implanted biventricular ICD which had reached elective replacement and insertion of a new biventricular ICD with ICD pocket revision and defibrillation threshold testing.  INTRODUCTION:  The patient is a 46 year old woman with a nonischemic cardiomyopathy, chronic congestive heart failure, severe left ventricular dysfunction, who underwent BiV ICD insertion several years ago.  She had much improvement in her heart failure symptoms.  She has now reached elective replacement on her device and is referred for generator change out.  PROCEDURE:  After informed consent was obtained, the patient was taken to the diagnostic catheterization lab in a fasting state.  After usual preparation and draping, intravenous fentanyl and midazolam were given for sedation.  30 mL of lidocaine was infiltrated into the left infraclavicular region.  A 7 cm incision was carried out over this region.  Electrocautery was utilized to dissect down to the ICD pocket. Electrocautery was utilized to enter the pocket and the generator was removed with gentle traction after the fibrous adhesions were freed up with electrocautery.  Having done this, the pocket was irrigated with antibiotic irrigation.  The old device was removed, and the new [employer] CRT-D, serial [id number] was connected to the old atrial RV and LV leads.  The leads were evaluated, and the thresholds were satisfactory with RV threshold of 1.1 V at 0.3 V over 0.8 V at 0.4, and atrial threshold 0.7 at 0.4, and LV threshold 1.4 at 0.8.  With these satisfactory parameters, the new device  was connected to the old leads and placed back in the subcutaneous pocket where the pocket was irrigated with antibiotic irrigation.  Prior to the re-insertion of the device, the pocket was revised with electrocautery to allow for the larger size device.  The pocket was then irrigated with antibiotic irrigation, and the incision was closed with 2-0 and 3-0 Vicryl.  At this point, I scrubbed out of the case to supervise defibrillation threshold testing.  After the patient was more deeply sedated under my direct supervision with additional fentanyl and Versed, VF was induced with a T-wave shock. A 17 joule shock was subsequently delivered which failed to terminate ventricular fibrillation.  A second 26 joule shock was delivered, which terminated ventricular fibrillation restored sinus rhythm.  No additional defibrillation threshold testing was carried out, and benzoin Steri-Strips were placed in the pocket and pressure dressing applied, and the patient was returned to recovery room in satisfactory condition.  COMPLICATIONS:  There are no immediate procedure complications.  RESULTS:  This demonstrates successful removal of previous implanted BiV ICD which had reached elective replacement followed by ICD pocket revision, followed by insertion of a new BiV device with defibrillation threshold testing.     Doylene Canning. [name], MD     GWT/MEDQ  D:  02/15/2012  T:  02/15/2012  Job:  [id number]

## 2012-02-15 NOTE — Interval H&P Note (Signed)
History and Physical Interval Note:  02/15/2012 8:31 AM  [name]  has presented today for surgery, with the diagnosis of End of life  The various methods of treatment have been discussed with the patient and family. After consideration of risks, benefits and other options for treatment, the patient has consented to  Procedure(s) (LRB): IMPLANTABLE CARDIOVERTER DEFIBRILLATOR GENERATOR CHANGE (Left) as a surgical intervention .  The patients' history has been reviewed, patient examined, no change in status, stable for surgery.  I have reviewed the patients' chart and labs.  Questions were answered to the patient's satisfaction.     [name]

## 2012-03-12 ENCOUNTER — Encounter: Payer: Medicare Other | Admitting: Internal Medicine

## 2012-05-09 ENCOUNTER — Emergency Department (HOSPITAL_COMMUNITY): Payer: Medicare Other

## 2012-05-09 ENCOUNTER — Emergency Department (HOSPITAL_COMMUNITY)
Admission: EM | Admit: 2012-05-09 | Discharge: 2012-05-09 | Disposition: A | Discharge status: Home / Self Care | Payer: Medicare Other | Attending: Emergency Medicine | Admitting: Emergency Medicine

## 2012-05-09 ENCOUNTER — Other Ambulatory Visit: Payer: Self-pay

## 2012-05-09 ENCOUNTER — Other Ambulatory Visit: Payer: Self-pay | Admitting: Cardiology

## 2012-05-09 ENCOUNTER — Encounter (HOSPITAL_COMMUNITY): Payer: Self-pay

## 2012-05-09 DIAGNOSIS — J45901 Unspecified asthma with (acute) exacerbation: Secondary | ICD-10-CM

## 2012-05-09 DIAGNOSIS — R110 Nausea: Secondary | ICD-10-CM | POA: Insufficient documentation

## 2012-05-09 DIAGNOSIS — I509 Heart failure, unspecified: Secondary | ICD-10-CM | POA: Insufficient documentation

## 2012-05-09 DIAGNOSIS — R5383 Other fatigue: Secondary | ICD-10-CM | POA: Insufficient documentation

## 2012-05-09 DIAGNOSIS — G609 Hereditary and idiopathic neuropathy, unspecified: Secondary | ICD-10-CM | POA: Insufficient documentation

## 2012-05-09 DIAGNOSIS — I5022 Chronic systolic (congestive) heart failure: Secondary | ICD-10-CM | POA: Insufficient documentation

## 2012-05-09 DIAGNOSIS — J45909 Unspecified asthma, uncomplicated: Secondary | ICD-10-CM | POA: Insufficient documentation

## 2012-05-09 DIAGNOSIS — Z950 Presence of cardiac pacemaker: Secondary | ICD-10-CM | POA: Insufficient documentation

## 2012-05-09 DIAGNOSIS — I428 Other cardiomyopathies: Secondary | ICD-10-CM | POA: Insufficient documentation

## 2012-05-09 DIAGNOSIS — I10 Essential (primary) hypertension: Secondary | ICD-10-CM | POA: Insufficient documentation

## 2012-05-09 DIAGNOSIS — R5381 Other malaise: Secondary | ICD-10-CM | POA: Insufficient documentation

## 2012-05-09 DIAGNOSIS — Z79899 Other long term (current) drug therapy: Secondary | ICD-10-CM | POA: Insufficient documentation

## 2012-05-09 DIAGNOSIS — R0602 Shortness of breath: Secondary | ICD-10-CM | POA: Insufficient documentation

## 2012-05-09 DIAGNOSIS — Z87891 Personal history of nicotine dependence: Secondary | ICD-10-CM | POA: Insufficient documentation

## 2012-05-09 LAB — BASIC METABOLIC PANEL
BUN: 6 mg/dL (ref 6–23)
Chloride: 95 mEq/L — ABNORMAL LOW (ref 96–112)
GFR calc Af Amer: 89 mL/min — ABNORMAL LOW (ref >90)
GFR calc non Af Amer: 77 mL/min — ABNORMAL LOW (ref >90)
Potassium: 3.1 mEq/L — ABNORMAL LOW (ref 3.5–5.1)

## 2012-05-09 LAB — CBC WITH DIFFERENTIAL/PLATELET
Basophils Absolute: 0 10*3/uL (ref 0.0–0.1)
Basophils Relative: 0 % (ref 0–1)
Eosinophils Absolute: 0.1 10*3/uL (ref 0.0–0.7)
Hemoglobin: 10.1 g/dL — ABNORMAL LOW (ref 12.0–15.0)
MCH: 24.6 pg — ABNORMAL LOW (ref 26.0–34.0)
MCHC: 30.8 g/dL (ref 30.0–36.0)
Monocytes Relative: 4 % (ref 3–12)
Neutro Abs: 4.5 10*3/uL (ref 1.7–7.7)
Neutrophils Relative %: 64 % (ref 43–77)
Platelets: 302 10*3/uL (ref 150–400)
RDW: 17.5 % — ABNORMAL HIGH (ref 11.5–15.5)

## 2012-05-09 LAB — URINALYSIS, ROUTINE W REFLEX MICROSCOPIC
Glucose, UA: NEGATIVE mg/dL
Hgb urine dipstick: NEGATIVE
Ketones, ur: NEGATIVE mg/dL
Leukocytes, UA: NEGATIVE
Protein, ur: NEGATIVE mg/dL
Urobilinogen, UA: 0.2 mg/dL (ref 0.0–1.0)

## 2012-05-09 LAB — PRO B NATRIURETIC PEPTIDE: Pro B Natriuretic peptide (BNP): 440 pg/mL — ABNORMAL HIGH (ref 0–125)

## 2012-05-09 MED ORDER — SODIUM CHLORIDE 0.9 % IV SOLN
INTRAVENOUS | Status: DC
  Administered 2012-05-09: 18:00:00 via INTRAVENOUS

## 2012-05-09 MED ORDER — ALBUTEROL SULFATE (5 MG/ML) 0.5% IN NEBU
10.0000 mg | INHALATION_SOLUTION | Freq: Once | RESPIRATORY_TRACT | Status: AC
  Administered 2012-05-09: 10 mg via RESPIRATORY_TRACT
  Filled 2012-05-09: qty 2

## 2012-05-09 MED ORDER — ALBUTEROL SULFATE (2.5 MG/3ML) 0.083% IN NEBU: 2.5000 mg | INHALATION_SOLUTION | RESPIRATORY_TRACT | Status: DC | PRN

## 2012-05-09 MED ORDER — IPRATROPIUM BROMIDE 0.02 % IN SOLN
1.0000 mg | Freq: Once | RESPIRATORY_TRACT | Status: AC
  Administered 2012-05-09: 1 mg via RESPIRATORY_TRACT
  Filled 2012-05-09: qty 5

## 2012-05-09 MED ORDER — METHYLPREDNISOLONE SODIUM SUCC 125 MG IJ SOLR
125.0000 mg | Freq: Once | INTRAMUSCULAR | Status: AC
  Administered 2012-05-09: 125 mg via INTRAVENOUS
  Filled 2012-05-09: qty 2

## 2012-05-09 MED ORDER — POTASSIUM CHLORIDE 20 MEQ/15ML (10%) PO LIQD
40.0000 meq | Freq: Once | ORAL | Status: AC
  Administered 2012-05-09: 40 meq via ORAL
  Filled 2012-05-09: qty 30

## 2012-05-09 MED ORDER — ONDANSETRON HCL 4 MG/2ML IJ SOLN
4.0000 mg | Freq: Once | INTRAMUSCULAR | Status: AC
  Administered 2012-05-09: 4 mg via INTRAVENOUS
  Filled 2012-05-09: qty 2

## 2012-05-09 MED ORDER — PREDNISONE 20 MG PO TABS: 40.0000 mg | ORAL_TABLET | Freq: Every day | ORAL | Status: AC

## 2012-05-09 NOTE — ED Notes (Signed)
Pt c/o sob and nausea that started yesterday.

## 2012-05-09 NOTE — ED Notes (Signed)
Pt up sitting in chair, NAD noted, pulse ox 94% at this time

## 2012-05-09 NOTE — ED Notes (Signed)
Respiratory paged for pt

## 2012-05-09 NOTE — ED Notes (Signed)
RT at bedside. Pt on continuous nebulizer therefore unable to provide a sample at this time.

## 2012-05-09 NOTE — ED Notes (Signed)
Will attempt to get urine sample when nebulizer is finished and pt is able to get up out of bed

## 2012-05-09 NOTE — ED Notes (Signed)
Asked patient for urine sample. Patient unable to provide sample at this time. Patient agrees to let us know when she is able to void.

## 2012-05-09 NOTE — ED Notes (Signed)
Pt still complains of slight SOB. Receiving continuous nebulizer at this time and states some relief. Airway is clear at this time.  NAD noted. Lungs slightly diminished, no wheezing noted.

## 2012-05-09 NOTE — ED Notes (Signed)
Pt maintained o2 sat of 95% while ambulating to bathroom. Pt complained of some increased SOB but was able to walk across hall and back. Pulse 100 throughout ambulation.

## 2012-05-09 NOTE — ED Notes (Addendum)
Pt reports she has been SOB, "for about a month." Has also been having abdominal pain. Came in today because she couldn't get out of bed because she was so SOB, her legs were weak and hurting, and because she has been nauseous. Also has been having dark colored stools but she reports she is on iron supplements 3 times a day. Pt reports she had a blood transfusion in May for these symptoms after they found she had a significantly low hema globin. [facility] found she had a bleed in her duodenum. Has a surgery to repair the bleed in September but states she cannot take this anymore.

## 2012-05-10 ENCOUNTER — Encounter (HOSPITAL_COMMUNITY): Payer: Self-pay | Admitting: Emergency Medicine

## 2012-05-10 NOTE — ED Provider Notes (Signed)
History     CSN: [id number]  Arrival date & time 05/09/12  1657   First MD Initiated Contact with Patient 05/09/12 1659      Chief Complaint  Patient presents with  . Shortness of Breath     HPI Pt was seen at 1730.  Per pt, c/o gradual onset and persistence of constant SOB since yesterday.  Has been associated with nausea and generalized weakness/fatigue.  Pt states she used her CPAP as well as her MDI/neb machine twice yesterday without relief.  Pt states she has run out of her neb solution and needs a rx for refill.  Pt states she is also concerned "about my anemia" due to ongoing GI bleeding "from my duodenum;" due for surgical repair in Sept at [facility] she has been taking her iron supplements as prescribed. Denies bloody stools. Denies PND, no orthopnea, no CP/palpitations, no cough, no abd pain, no back pain, no fevers, no rash, no vomiting/diarrhea.       Past Medical History  Diagnosis Date  . CHF (congestive heart failure)     chronic systolic CHF,  . Nonischemic cardiomyopathy     EF 25%  . Mitral regurgitation     moderate to severe  . S/P cardiac catheterization 10/06    normal coronary arteries  . LBBB (left bundle branch block)   . HTN (hypertension)   . Asthma   . IBS (irritable bowel syndrome)     with primarily constipation  . Morbid obesity   . GERD (gastroesophageal reflux disease)   . IDA (iron deficiency anemia)     2o TO SB AVMS, Hx parenteral iron Dr Mariel Sleet  . AVM (arteriovenous malformation)     Small bowel, s/p duble balloon enteroscopy/APC jejunum Dr Gwinda Passe [facility]) 01/23/2011  . Anemia, chronic disease   . Peripheral neuropathy   . OSA (obstructive sleep apnea)     on CPAP qhs  . GI bleed 06/2009    Hgb 7.5, ferritin 7, transfusion, iron IV  . Dyspnea on exertion     chronic    Past Surgical History  Procedure Date  . Crdt-implantation 4/07    [employer]. remote- yes  . Appendectomy   . Cholecystectomy     biliary  dyskinesia  . Mastectomy 2003    left, partial  . Knee arthroscopy 2005    left  . Small bowel enteroscopy MAY 2012 DBE [facility] DR. GILLIAM    SB AVMS s/p APC  . Colonoscopy OCT 2010/SEP 2011    tortuous colon, 3 polyps-benign(2010),   . Upper gastrointestinal endoscopy '08, '10, SEP 2011    mild antral gastritis (2010), negative SB bx (2010), incomlpete Schatzki's ring (2011)  . Small bowel enteroscopy SEP 2011 [facility]    Fields-NO AVMS  . Givens capsule study NOV 2010     NO AVMS, normal  . Cardiac defibrillator placement     Family History  Problem Relation Age of Onset  . Coronary artery disease      premature CAD- no family Hx   . Colon cancer Neg Hx     no family Hx of polyps too  . Cervical cancer Mother     History  Substance Use Topics  . Smoking status: Former Smoker -- 0.5 packs/day for 20 years    Types: Cigarettes  . Smokeless tobacco: Not on file  . Alcohol Use: No    Review of Systems ROS: Statement: All systems negative except as marked or noted in the  HPI; Constitutional: Negative for fever and chills. ; ; Eyes: Negative for eye pain, redness and discharge. ; ; ENMT: Negative for ear pain, hoarseness, nasal congestion, sinus pressure and sore throat. ; ; Cardiovascular: Negative for chest pain, palpitations, diaphoresis, and peripheral edema. ; ; Respiratory: +SOB. Negative for cough, wheezing and stridor. ; ; Gastrointestinal: +nausea. Negative for vomiting, diarrhea, abdominal pain, blood in stool, hematemesis, jaundice and rectal bleeding. . ; ; Genitourinary: Negative for dysuria, flank pain and hematuria. ; ; Musculoskeletal: Negative for back pain and neck pain. Negative for swelling and trauma.; ; Skin: Negative for pruritus, rash, abrasions, blisters, bruising and skin lesion.; ; Neuro: +generalized weakness. Negative for headache, lightheadedness and neck stiffness. Negative for altered level of consciousness , altered mental status, extremity weakness,  paresthesias, involuntary movement, seizure and syncope.       Allergies  Review of patient's allergies indicates no known allergies.  Home Medications   Current Outpatient Rx  Name Route Sig Dispense Refill  . ALBUTEROL SULFATE (2.5 MG/3ML) 0.083% IN NEBU Nebulization Take 2.5 mg by nebulization every 6 (six) hours as needed. FOR SHORTNESS OF BREATH    . ALBUTEROL SULFATE HFA 108 (90 BASE) MCG/ACT IN AERS Inhalation Inhale 2 puffs into the lungs every 6 (six) hours as needed. For shortness of breath    . BENAZEPRIL HCL 40 MG PO TABS Oral Take 40 mg by mouth daily.     Marland Kitchen CARVEDILOL 25 MG PO TABS Oral Take 25 mg by mouth 2 (two) times daily.    Marland Kitchen CETIRIZINE HCL 10 MG PO TABS Oral Take 10 mg by mouth daily as needed. For allergies    . CYCLOBENZAPRINE HCL 10 MG PO TABS Oral Take 10 mg by mouth 3 (three) times daily as needed. For muscle spasms    . FERROUS SULFATE 325 (65 FE) MG PO TABS Oral Take 325 mg by mouth 3 (three) times daily.    Marland Kitchen GABAPENTIN 300 MG PO CAPS Oral Take 300-600 mg by mouth 2 (two) times daily. 1 capsule in am and 2 in pm    . HYDRALAZINE HCL 100 MG PO TABS Oral Take 100 mg by mouth 3 (three) times daily. For heart failure    . ISOSORBIDE MONONITRATE ER 60 MG PO TB24 Oral Take 60 mg by mouth 3 (three) times daily. For heart failure    . OMEPRAZOLE 40 MG PO CPDR Oral Take 40 mg by mouth daily.     Marland Kitchen POTASSIUM CHLORIDE CRYS ER 20 MEQ PO TBCR Oral Take 20 mEq by mouth 5 (five) times daily.     Marland Kitchen PRENATABS RX PO Oral Take 1 tablet by mouth daily.    . TORSEMIDE 20 MG PO TABS Oral Take 40 mg by mouth daily. For heart failure    . ALBUTEROL SULFATE (2.5 MG/3ML) 0.083% IN NEBU Nebulization Take 3 mLs (2.5 mg total) by nebulization every 4 (four) hours as needed for wheezing. 75 mL 0  . IMPLANON [place] Subcutaneous Inject 1 each into the skin once. Gets every 3 years    . PREDNISONE 20 MG PO TABS Oral Take 2 tablets (40 mg total) by mouth daily. 10 tablet 0    BP 114/87  Pulse  85  Temp 98.5 F (36.9 C) (Oral)  Resp 20  Ht 5\' 6"  (1.676 m)  Wt 275 lb (124.739 kg)  BMI 44.39 kg/m2  SpO2 100%  Physical Exam 1735: Physical examination:  Nursing notes reviewed; Vital signs and O2 SAT reviewed;  Constitutional: Well developed, Well nourished, Well hydrated, In no acute distress; Head:  Normocephalic, atraumatic; Eyes: Conjunctiva pale. EOMI, PERRL, No scleral icterus; ENMT: Mouth and pharynx normal, Mucous membranes moist; Neck: Supple, Full range of motion, No lymphadenopathy; Cardiovascular: Regular rate and rhythm, No gallop; Respiratory: Breath sounds diminished & equal bilaterally, No wheezes.  Speaking in phrases. Tachypneic, sitting upright.; Chest: Nontender, Movement normal; Abdomen: Soft, Nontender, Nondistended, Normal bowel sounds;; Extremities: Pulses normal, No tenderness, No edema, No calf edema or asymmetry.; Neuro: AA&Ox3, Major CN grossly intact.  Speech clear. No gross focal motor or sensory deficits in extremities.; Skin: Color normal, Warm, Dry.   ED Course  Procedures   MDM  MDM Reviewed: previous chart, nursing note and vitals Reviewed previous: ECG and labs Interpretation: ECG, labs and x-ray    Date: 05/09/2012  Rate: 91  Rhythm: Electronic pacemaker  QRS Axis: normal  Intervals: normal  ST/T Wave abnormalities: normal  Conduction Disutrbances:nonspecific intraventricular conduction delay  Narrative Interpretation:   Old EKG Reviewed: unchanged; no significant changes from previous EKG dated 01/30/2012.  Results for orders placed during the hospital encounter of 05/09/12  CBC WITH DIFFERENTIAL      Component Value Range   WBC 7.0  4.0 - 10.5 K/uL   RBC 4.11  3.87 - 5.11 MIL/uL   Hemoglobin 10.1 (*) 12.0 - 15.0 g/dL   HCT 16.1 (*) 09.6 - 04.5 %   MCV 79.8  78.0 - 100.0 fL   MCH 24.6 (*) 26.0 - 34.0 pg   MCHC 30.8  30.0 - 36.0 g/dL   RDW 40.9 (*) 81.1 - 91.4 %   Platelets 302  150 - 400 K/uL   Neutrophils Relative 64  43 - 77 %     Neutro Abs 4.5  1.7 - 7.7 K/uL   Lymphocytes Relative 31  12 - 46 %   Lymphs Abs 2.2  0.7 - 4.0 K/uL   Monocytes Relative 4  3 - 12 %   Monocytes Absolute 0.3  0.1 - 1.0 K/uL   Eosinophils Relative 2  0 - 5 %   Eosinophils Absolute 0.1  0.0 - 0.7 K/uL   Basophils Relative 0  0 - 1 %   Basophils Absolute 0.0  0.0 - 0.1 K/uL  BASIC METABOLIC PANEL      Component Value Range   Sodium 136  135 - 145 mEq/L   Potassium 3.1 (*) 3.5 - 5.1 mEq/L   Chloride 95 (*) 96 - 112 mEq/L   CO2 32  19 - 32 mEq/L   Glucose, Bld 113 (*) 70 - 99 mg/dL   BUN 6  6 - 23 mg/dL   Creatinine, Ser 7.82  0.50 - 1.10 mg/dL   Calcium 8.5  8.4 - 95.6 mg/dL   GFR calc non Af Amer 77 (*) >90 mL/min   GFR calc Af Amer 89 (*) >90 mL/min  PRO B NATRIURETIC PEPTIDE      Component Value Range   Pro B Natriuretic peptide (BNP) 440.0 (*) 0 - 125 pg/mL  TROPONIN I      Component Value Range   Troponin I <0.30  <0.30 ng/mL  URINALYSIS, ROUTINE W REFLEX MICROSCOPIC      Component Value Range   Color, Urine AMBER (*) YELLOW   APPearance CLEAR  CLEAR   Specific Gravity, Urine 1.025  1.005 - 1.030   pH 6.0  5.0 - 8.0   Glucose, UA NEGATIVE  NEGATIVE mg/dL   Hgb urine dipstick NEGATIVE  NEGATIVE   Bilirubin Urine NEGATIVE  NEGATIVE   Ketones, ur NEGATIVE  NEGATIVE mg/dL   Protein, ur NEGATIVE  NEGATIVE mg/dL   Urobilinogen, UA 0.2  0.0 - 1.0 mg/dL   Nitrite NEGATIVE  NEGATIVE   Leukocytes, UA NEGATIVE  NEGATIVE    Dg Chest Port 1 View 05/09/2012  *RADIOLOGY REPORT*  Clinical Data: Shortness of breath.  PORTABLE CHEST - 1 VIEW  Comparison: Plain films chest 02/15/2012.  Findings: There is cardiomegaly but no edema.  AICD in place.  No focal airspace disease or effusion.  No pneumothorax.  IMPRESSION: Cardiomegaly without acute disease.   Original Report Authenticated By: Bernadene Bell. Maricela Curet, M.D.     Results for SEINI, LANNOM (MRN [id number]) as of 05/09/2012 21:41  Ref. Range 01/30/2012 17:25 01/31/2012 06:12  02/01/2012 05:50 02/06/2012 15:20 05/09/2012 17:31  Hemoglobin Latest Range: 12.0-15.0 g/dL 7.9 (L) 8.9 (L) 9.8 (L) 10.5 (L) 10.1 (L)  HCT Latest Range: 36.0-46.0 % 26.0 (L) 29.2 (L) 31.5 (L) 32.0 (L) 32.8 (L)      2130:  H/H per baseline.  Pt has hx chronic CHF with BNP mildly elevated today, but no signs of CHF/pulm vascular congestion on CXR; doubt acute CHF.  Pt sitting in a chair, feels "better now" after neb and IV solumedrol and wants to to home.  NAD, lungs CTA bilat with improved air movement, resps easy, Sats 95% R/A, speaking complete sentences. Tol PO well without N/V or resp distress. Pt has ambulated through the hallway to the bathroom several times without resp distress and Sats remaining 95% R/A.  Appears asthma exacerbation at this time, will tx prednisone and MDI/nebs.  Requesting refill of her neb solution, states she has enough MDI at home.  Wants to leave now.  Dx testing d/w pt and family.  Questions answered.  Verb understanding, agreeable to d/c home with outpt f/u.        Laray Anger, DO 05/11/12 1004

## 2012-05-11 LAB — URINE CULTURE: Colony Count: 75000

## 2012-05-12 HISTORY — PX: ESOPHAGOGASTRODUODENOSCOPY: SHX1529

## 2012-06-18 ENCOUNTER — Encounter: Payer: Self-pay | Admitting: *Deleted

## 2012-06-21 ENCOUNTER — Encounter (HOSPITAL_COMMUNITY): Payer: Self-pay | Admitting: *Deleted

## 2012-06-21 ENCOUNTER — Inpatient Hospital Stay (HOSPITAL_COMMUNITY)
Admission: EM | Admit: 2012-06-21 | Discharge: 2012-06-28 | DRG: 638 | Disposition: A | Discharge status: Home / Self Care | Payer: Medicare Other | Attending: Family Medicine | Admitting: Family Medicine

## 2012-06-21 ENCOUNTER — Emergency Department (HOSPITAL_COMMUNITY): Payer: Medicare Other

## 2012-06-21 DIAGNOSIS — I447 Left bundle-branch block, unspecified: Secondary | ICD-10-CM | POA: Diagnosis present

## 2012-06-21 DIAGNOSIS — Z23 Encounter for immunization: Secondary | ICD-10-CM

## 2012-06-21 DIAGNOSIS — L723 Sebaceous cyst: Secondary | ICD-10-CM | POA: Diagnosis present

## 2012-06-21 DIAGNOSIS — E1100 Type 2 diabetes mellitus with hyperosmolarity without nonketotic hyperglycemic-hyperosmolar coma (NKHHC): Principal | ICD-10-CM | POA: Diagnosis present

## 2012-06-21 DIAGNOSIS — E876 Hypokalemia: Secondary | ICD-10-CM | POA: Diagnosis not present

## 2012-06-21 DIAGNOSIS — Z79899 Other long term (current) drug therapy: Secondary | ICD-10-CM

## 2012-06-21 DIAGNOSIS — Z95810 Presence of automatic (implantable) cardiac defibrillator: Secondary | ICD-10-CM

## 2012-06-21 DIAGNOSIS — G4733 Obstructive sleep apnea (adult) (pediatric): Secondary | ICD-10-CM | POA: Diagnosis present

## 2012-06-21 DIAGNOSIS — K219 Gastro-esophageal reflux disease without esophagitis: Secondary | ICD-10-CM | POA: Diagnosis present

## 2012-06-21 DIAGNOSIS — B370 Candidal stomatitis: Secondary | ICD-10-CM | POA: Diagnosis present

## 2012-06-21 DIAGNOSIS — I059 Rheumatic mitral valve disease, unspecified: Secondary | ICD-10-CM | POA: Diagnosis present

## 2012-06-21 DIAGNOSIS — Z9010 Acquired absence of unspecified breast and nipple: Secondary | ICD-10-CM

## 2012-06-21 DIAGNOSIS — R739 Hyperglycemia, unspecified: Secondary | ICD-10-CM

## 2012-06-21 DIAGNOSIS — I5022 Chronic systolic (congestive) heart failure: Secondary | ICD-10-CM | POA: Diagnosis present

## 2012-06-21 DIAGNOSIS — I509 Heart failure, unspecified: Secondary | ICD-10-CM | POA: Diagnosis present

## 2012-06-21 DIAGNOSIS — N920 Excessive and frequent menstruation with regular cycle: Secondary | ICD-10-CM | POA: Diagnosis present

## 2012-06-21 DIAGNOSIS — Z87891 Personal history of nicotine dependence: Secondary | ICD-10-CM

## 2012-06-21 DIAGNOSIS — Z6841 Body Mass Index (BMI) 40.0 and over, adult: Secondary | ICD-10-CM

## 2012-06-21 DIAGNOSIS — G609 Hereditary and idiopathic neuropathy, unspecified: Secondary | ICD-10-CM | POA: Diagnosis present

## 2012-06-21 DIAGNOSIS — D638 Anemia in other chronic diseases classified elsewhere: Secondary | ICD-10-CM | POA: Diagnosis present

## 2012-06-21 DIAGNOSIS — N898 Other specified noninflammatory disorders of vagina: Secondary | ICD-10-CM

## 2012-06-21 DIAGNOSIS — L0291 Cutaneous abscess, unspecified: Secondary | ICD-10-CM

## 2012-06-21 DIAGNOSIS — I428 Other cardiomyopathies: Secondary | ICD-10-CM | POA: Diagnosis present

## 2012-06-21 DIAGNOSIS — D509 Iron deficiency anemia, unspecified: Secondary | ICD-10-CM | POA: Diagnosis present

## 2012-06-21 DIAGNOSIS — J45909 Unspecified asthma, uncomplicated: Secondary | ICD-10-CM | POA: Diagnosis present

## 2012-06-21 DIAGNOSIS — K589 Irritable bowel syndrome without diarrhea: Secondary | ICD-10-CM | POA: Diagnosis present

## 2012-06-21 DIAGNOSIS — L0390 Cellulitis, unspecified: Secondary | ICD-10-CM

## 2012-06-21 DIAGNOSIS — I10 Essential (primary) hypertension: Secondary | ICD-10-CM | POA: Diagnosis present

## 2012-06-21 LAB — POCT PREGNANCY, URINE: Preg Test, Ur: NEGATIVE

## 2012-06-21 LAB — CBC WITH DIFFERENTIAL/PLATELET
Basophils Absolute: 0 10*3/uL (ref 0.0–0.1)
Basophils Relative: 0 % (ref 0–1)
HCT: 36.8 % (ref 36.0–46.0)
Hemoglobin: 12.1 g/dL (ref 12.0–15.0)
Lymphocytes Relative: 36 % (ref 12–46)
MCHC: 32.9 g/dL (ref 30.0–36.0)
Neutro Abs: 3.4 10*3/uL (ref 1.7–7.7)
Neutrophils Relative %: 57 % (ref 43–77)
RDW: 16.3 % — ABNORMAL HIGH (ref 11.5–15.5)
WBC: 6 10*3/uL (ref 4.0–10.5)

## 2012-06-21 LAB — TROPONIN I: Troponin I: <0.3 ng/mL (ref <0.30)

## 2012-06-21 LAB — COMPREHENSIVE METABOLIC PANEL
ALT: 13 U/L (ref 0–35)
AST: 17 U/L (ref 0–37)
Albumin: 3.3 g/dL — ABNORMAL LOW (ref 3.5–5.2)
Alkaline Phosphatase: 98 U/L (ref 39–117)
CO2: 24 mEq/L (ref 19–32)
Chloride: 88 mEq/L — ABNORMAL LOW (ref 96–112)
GFR calc non Af Amer: >90 mL/min (ref >90)
Potassium: 4 mEq/L (ref 3.5–5.1)
Total Bilirubin: 0.3 mg/dL (ref 0.3–1.2)

## 2012-06-21 LAB — LIPID PANEL
Cholesterol: 195 mg/dL (ref 0–200)
HDL: 45 mg/dL (ref >39)
LDL Cholesterol: 103 mg/dL — ABNORMAL HIGH (ref 0–99)
Total CHOL/HDL Ratio: 4.3 RATIO
Triglycerides: 233 mg/dL — ABNORMAL HIGH (ref <150)
VLDL: 47 mg/dL — ABNORMAL HIGH (ref 0–40)

## 2012-06-21 LAB — URINALYSIS, ROUTINE W REFLEX MICROSCOPIC
Bilirubin Urine: NEGATIVE
Glucose, UA: >1000 mg/dL — AB
Ketones, ur: 40 mg/dL — AB
Protein, ur: NEGATIVE mg/dL

## 2012-06-21 LAB — URINE MICROSCOPIC-ADD ON

## 2012-06-21 LAB — SAMPLE TO BLOOD BANK

## 2012-06-21 LAB — MAGNESIUM: Magnesium: 1.7 mg/dL (ref 1.5–2.5)

## 2012-06-21 LAB — WET PREP, GENITAL: Trich, Wet Prep: NONE SEEN

## 2012-06-21 MED ORDER — SODIUM CHLORIDE 0.9 % IV SOLN
1000.0000 mL | Freq: Once | INTRAVENOUS | Status: AC
  Administered 2012-06-21: 1000 mL via INTRAVENOUS

## 2012-06-21 MED ORDER — INSULIN REGULAR HUMAN 100 UNIT/ML IJ SOLN
INTRAMUSCULAR | Status: AC
  Filled 2012-06-21: qty 3

## 2012-06-21 MED ORDER — POTASSIUM CHLORIDE CRYS ER 20 MEQ PO TBCR
30.0000 meq | EXTENDED_RELEASE_TABLET | Freq: Three times a day (TID) | ORAL | Status: DC
  Administered 2012-06-21 – 2012-06-27 (×18): 30 meq via ORAL
  Filled 2012-06-21 (×9): qty 1
  Filled 2012-06-21: qty 2
  Filled 2012-06-21 (×10): qty 1

## 2012-06-21 MED ORDER — SODIUM CHLORIDE 0.9 % IV SOLN
Freq: Once | INTRAVENOUS | Status: AC
Start: 2012-06-21 — End: 2012-06-21
  Administered 2012-06-21: 18:00:00 via INTRAVENOUS

## 2012-06-21 MED ORDER — FUROSEMIDE 40 MG PO TABS
40.0000 mg | ORAL_TABLET | Freq: Two times a day (BID) | ORAL | Status: DC
  Administered 2012-06-22 – 2012-06-28 (×12): 40 mg via ORAL
  Filled 2012-06-21 (×12): qty 1

## 2012-06-21 MED ORDER — CARVEDILOL 12.5 MG PO TABS
25.0000 mg | ORAL_TABLET | Freq: Two times a day (BID) | ORAL | Status: DC
  Administered 2012-06-22 – 2012-06-28 (×10): 25 mg via ORAL
  Filled 2012-06-21 (×4): qty 2
  Filled 2012-06-21: qty 1
  Filled 2012-06-21 (×2): qty 2
  Filled 2012-06-21 (×2): qty 1
  Filled 2012-06-21 (×2): qty 2
  Filled 2012-06-21: qty 1
  Filled 2012-06-21: qty 2
  Filled 2012-06-21: qty 1
  Filled 2012-06-21: qty 2

## 2012-06-21 MED ORDER — POLYSACCHARIDE IRON COMPLEX 150 MG PO CAPS
150.0000 mg | ORAL_CAPSULE | Freq: Every day | ORAL | Status: DC
  Filled 2012-06-21 (×3): qty 1

## 2012-06-21 MED ORDER — HYDRALAZINE HCL 25 MG PO TABS
100.0000 mg | ORAL_TABLET | Freq: Three times a day (TID) | ORAL | Status: DC
  Administered 2012-06-22 – 2012-06-28 (×16): 100 mg via ORAL
  Filled 2012-06-21: qty 2
  Filled 2012-06-21: qty 1
  Filled 2012-06-21: qty 4
  Filled 2012-06-21 (×3): qty 2
  Filled 2012-06-21 (×3): qty 4
  Filled 2012-06-21: qty 2
  Filled 2012-06-21 (×2): qty 4
  Filled 2012-06-21 (×2): qty 2
  Filled 2012-06-21: qty 4
  Filled 2012-06-21: qty 3
  Filled 2012-06-21 (×8): qty 4
  Filled 2012-06-21: qty 2
  Filled 2012-06-21: qty 4

## 2012-06-21 MED ORDER — PANTOPRAZOLE SODIUM 40 MG PO TBEC
40.0000 mg | DELAYED_RELEASE_TABLET | Freq: Every day | ORAL | Status: DC
  Administered 2012-06-22 – 2012-06-27 (×6): 40 mg via ORAL
  Filled 2012-06-21 (×6): qty 1

## 2012-06-21 MED ORDER — INSULIN GLARGINE 100 UNIT/ML ~~LOC~~ SOLN: 25.0000 [IU] | Freq: Every day | SUBCUTANEOUS | Status: DC

## 2012-06-21 MED ORDER — IPRATROPIUM-ALBUTEROL 0.5-2.5 (3) MG/3ML IN SOLN: 3.0000 mL | RESPIRATORY_TRACT | Status: DC

## 2012-06-21 MED ORDER — SODIUM CHLORIDE 0.9 % IV SOLN
1000.0000 mL | INTRAVENOUS | Status: DC
  Administered 2012-06-21: 1000 mL via INTRAVENOUS

## 2012-06-21 MED ORDER — ALBUTEROL SULFATE (5 MG/ML) 0.5% IN NEBU: 2.5000 mg | INHALATION_SOLUTION | RESPIRATORY_TRACT | Status: DC | PRN

## 2012-06-21 MED ORDER — SODIUM CHLORIDE 0.9 % IV SOLN
INTRAVENOUS | Status: DC
  Administered 2012-06-21: 4.9 [IU]/h via INTRAVENOUS
  Filled 2012-06-21: qty 1

## 2012-06-21 MED ORDER — ISOSORBIDE MONONITRATE ER 60 MG PO TB24
60.0000 mg | ORAL_TABLET | Freq: Every day | ORAL | Status: DC
  Administered 2012-06-22 – 2012-06-27 (×6): 60 mg via ORAL
  Filled 2012-06-21 (×10): qty 1

## 2012-06-21 MED ORDER — INSULIN ASPART 100 UNIT/ML ~~LOC~~ SOLN
10.0000 [IU] | Freq: Once | SUBCUTANEOUS | Status: AC
  Administered 2012-06-21: 20:00:00 via INTRAVENOUS
  Filled 2012-06-21: qty 1

## 2012-06-21 MED ORDER — BENAZEPRIL HCL 10 MG PO TABS
40.0000 mg | ORAL_TABLET | Freq: Every day | ORAL | Status: DC
  Administered 2012-06-22 – 2012-06-27 (×7): 40 mg via ORAL
  Filled 2012-06-21 (×3): qty 4
  Filled 2012-06-21: qty 1
  Filled 2012-06-21: qty 4
  Filled 2012-06-21: qty 3
  Filled 2012-06-21 (×2): qty 1
  Filled 2012-06-21: qty 4
  Filled 2012-06-21: qty 1
  Filled 2012-06-21: qty 4
  Filled 2012-06-21: qty 2
  Filled 2012-06-21: qty 3

## 2012-06-21 MED ORDER — ASPIRIN 81 MG PO CHEW
81.0000 mg | CHEWABLE_TABLET | Freq: Every day | ORAL | Status: DC
  Administered 2012-06-21 – 2012-06-24 (×4): 81 mg via ORAL
  Filled 2012-06-21 (×4): qty 1

## 2012-06-21 MED ORDER — INSULIN GLARGINE 100 UNIT/ML ~~LOC~~ SOLN: 20.0000 [IU] | Freq: Every day | SUBCUTANEOUS | Status: DC

## 2012-06-21 MED ORDER — LORAZEPAM 0.5 MG PO TABS
0.5000 mg | ORAL_TABLET | Freq: Four times a day (QID) | ORAL | Status: DC | PRN
  Administered 2012-06-22: 0.5 mg via ORAL
  Filled 2012-06-21: qty 1

## 2012-06-21 NOTE — ED Notes (Signed)
Pt presents with vaginal bleeding with clots and weakness x 7 days. Pt reports changing pad every hour. Pt also c/o abdominal pain. Pt was seen at [facility] office and was sent to Ed per his instructions. Pt currently has a v-paced defibrillator r/t cardiomyopathy. Pt denies chest pain at this time.  Pt is alert, oriented x 4. Skin pink warm and dry to touch.

## 2012-06-21 NOTE — ED Provider Notes (Cosign Needed)
History     CSN: [id number]  Arrival date & time 06/21/12  1531   First MD Initiated Contact with Patient 06/21/12 1659      Chief Complaint  Patient presents with  . Vaginal Bleeding    (Consider location/radiation/quality/duration/timing/severity/associated sxs/prior treatment) HPI Comments: The patient is a 46 year old woman who complains that she has been having vaginal bleeding with clots for about 8 days. Prior to that she had a vaginal infection with yeast that she has been treating with Monistat cream. She has previously had GI bleeding and this had to be transfused, the last time in May of 2013. She was seen today by John Giovanni, her family physician, who is concerned that she might have bled down to the point of needing a transfusion again, and who sent her to the [facility] ED for evaluation.  Patient is a 46 y.o. female presenting with vaginal bleeding. The history is provided by the patient and medical records. No language interpreter was used.  Vaginal Bleeding This is a new problem. The current episode started more than 1 week ago. The problem occurs constantly. The problem has not changed since onset.Pertinent negatives include no chest pain, no abdominal pain, no headaches and no shortness of breath. Nothing aggravates the symptoms. Nothing relieves the symptoms. Treatments tried: Monistat. The treatment provided mild relief.    Past Medical History  Diagnosis Date  . CHF (congestive heart failure)     chronic systolic CHF,  . Nonischemic cardiomyopathy     EF 25%  . Mitral regurgitation     moderate to severe  . S/P cardiac catheterization 10/06    normal coronary arteries  . LBBB (left bundle branch block)   . HTN (hypertension)   . Asthma   . IBS (irritable bowel syndrome)     with primarily constipation  . Morbid obesity   . GERD (gastroesophageal reflux disease)   . IDA (iron deficiency anemia)     2o TO SB AVMS, Hx parenteral iron Dr Mariel Sleet  .  AVM (arteriovenous malformation)     Small bowel, s/p duble balloon enteroscopy/APC jejunum Dr Gwinda Passe [facility]) 01/23/2011  . Anemia, chronic disease   . Peripheral neuropathy   . OSA (obstructive sleep apnea)     on CPAP qhs  . GI bleed 06/2009    Hgb 7.5, ferritin 7, transfusion, iron IV  . Dyspnea on exertion     chronic    Past Surgical History  Procedure Date  . Crdt-implantation 4/07    [employer]. remote- yes  . Appendectomy   . Cholecystectomy     biliary dyskinesia  . Mastectomy 2003    left, partial  . Knee arthroscopy 2005    left  . Small bowel enteroscopy MAY 2012 D[facility] DR. GILLIAM    SB AVMS s/p APC  . Colonoscopy OCT 2010/SEP 2011    tortuous colon, 3 polyps-benign(2010),   . Upper gastrointestinal endoscopy '08, '10, SEP 2011    mild antral gastritis (2010), negative SB bx (2010), incomlpete Schatzki's ring (2011)  . Small bowel enteroscopy SEP 2011 [facility]    Fields-NO AVMS  . Givens capsule study NOV 2010     NO AVMS, normal  . Cardiac defibrillator placement   . Breast surgery     Family History  Problem Relation Age of Onset  . Coronary artery disease      premature CAD- no family Hx   . Colon cancer Neg Hx     no  family Hx of polyps too  . Cervical cancer Mother     History  Substance Use Topics  . Smoking status: Former Smoker -- 0.5 packs/day for 20 years    Types: Cigarettes  . Smokeless tobacco: Not on file  . Alcohol Use: No    OB History    Grav Para Term Preterm Abortions TAB SAB Ect Mult Living                  Review of Systems  Constitutional:       Subjective fever and chills.  HENT:       Thrush on tongue.  Eyes: Negative.   Respiratory: Negative.  Negative for shortness of breath.   Cardiovascular: Negative for chest pain.  Gastrointestinal: Negative.  Negative for abdominal pain.  Genitourinary: Positive for vaginal bleeding.  Musculoskeletal: Negative.   Neurological: Negative for weakness and  headaches.  Psychiatric/Behavioral: Negative.     Allergies  Review of patient's allergies indicates no known allergies.  Home Medications   Current Outpatient Rx  Name Route Sig Dispense Refill  . ALBUTEROL SULFATE (2.5 MG/3ML) 0.083% IN NEBU Nebulization Take 2.5 mg by nebulization every 6 (six) hours as needed. FOR SHORTNESS OF BREATH    . ALBUTEROL SULFATE (2.5 MG/3ML) 0.083% IN NEBU Nebulization Take 3 mLs (2.5 mg total) by nebulization every 4 (four) hours as needed for wheezing. 75 mL 0  . ALBUTEROL SULFATE HFA 108 (90 BASE) MCG/ACT IN AERS Inhalation Inhale 2 puffs into the lungs every 6 (six) hours as needed. For shortness of breath    . BENAZEPRIL HCL 40 MG PO TABS Oral Take 40 mg by mouth daily.     Marland Kitchen CARVEDILOL 25 MG PO TABS Oral Take 25 mg by mouth 2 (two) times daily.    Marland Kitchen CETIRIZINE HCL 10 MG PO TABS Oral Take 10 mg by mouth daily as needed. For allergies    . CYCLOBENZAPRINE HCL 10 MG PO TABS Oral Take 10 mg by mouth 3 (three) times daily as needed. For muscle spasms    . IMPLANON [place] Subcutaneous Inject 1 each into the skin once. Gets every 3 years    . FERROUS SULFATE 325 (65 FE) MG PO TABS Oral Take 325 mg by mouth 3 (three) times daily.    Marland Kitchen GABAPENTIN 300 MG PO CAPS Oral Take 300-600 mg by mouth 2 (two) times daily. 1 capsule in am and 2 in pm    . HYDRALAZINE HCL 100 MG PO TABS Oral Take 100 mg by mouth 3 (three) times daily. For heart failure    . ISOSORBIDE MONONITRATE ER 60 MG PO TB24 Oral Take 60 mg by mouth 3 (three) times daily. For heart failure    . OMEPRAZOLE 40 MG PO CPDR Oral Take 40 mg by mouth daily.     Marland Kitchen POTASSIUM CHLORIDE CRYS ER 20 MEQ PO TBCR Oral Take 20 mEq by mouth 5 (five) times daily.     Marland Kitchen PRENATABS RX PO Oral Take 1 tablet by mouth daily.    . TORSEMIDE 20 MG PO TABS Oral Take 40 mg by mouth daily. For heart failure      BP 125/77  Pulse 84  Temp 98.8 F (37.1 C) (Oral)  Resp 20  Ht 5\' 6"  (1.676 m)  Wt 275 lb (124.739 kg)  BMI 44.39  kg/m2  SpO2 100%  Physical Exam  Nursing note and vitals reviewed. Constitutional: She is oriented to person, place, and time.  Obese woman, mild distress.  HENT:  Head: Normocephalic and atraumatic.  Right Ear: External ear normal.  Left Ear: External ear normal.       Thrush on tongue.  Eyes: Conjunctivae normal and EOM are normal. Pupils are equal, round, and reactive to light.  Neck: Normal range of motion. Neck supple.  Cardiovascular: Normal rate, regular rhythm and normal heart sounds.   Pulmonary/Chest: Effort normal and breath sounds normal.  Abdominal: Soft. Bowel sounds are normal. She exhibits no distension. There is no tenderness. There is no rebound.  Genitourinary:       Normal external genitalia.  Speculum exam shows a bloody and purulent discharge.  Bimanual exam mild uterine tenderness,no adnexal tenderness.    Musculoskeletal: Normal range of motion. She exhibits no edema.  Neurological: She is alert and oriented to person, place, and time.       No sensory or motor deficit.  Skin: Skin is warm and dry.  Psychiatric: She has a normal mood and affect. Her behavior is normal.    ED Course  Procedures (including critical care time)  5:35 PM Pt seen --> physical exam performed.  Old charts reviewed. Lab workup ordered.  5:58 PM  Date: 06/21/2012  Rate: 85  Rhythm: Electronically paced rhythm with bigeminy  QRS Axis: right  Intervals: QT prolonged  ST/T Wave abnormalities: normal  Conduction Disutrbances:none  Narrative Interpretation: Abnormal EKG  Old EKG Reviewed: changes noted--bigeminal rhythm is new since tracing of 05/09/2012.  7:36 PM Lab tests showed no anemia.  Chest x-ray shows cardiomegaly, AICD.  Requested pelvic exam setup.   7:43 PM Glucose was elevated at 594.  IV insulin ordered and glucose stabilizer ordered.  Call to Dr. [name] or his coverage to admit pt.  9:31 PM Wet prep was negative.  Case discussed with Dr. Janna Arch, who  will see and admit pt.  1. Hyperglycemia   2. Vaginal discharge           Carleene Cooper III, MD 06/21/12 2132

## 2012-06-21 NOTE — ED Notes (Signed)
Patient states she does not need anything at this time. 

## 2012-06-21 NOTE — H&P (Signed)
[id number] 

## 2012-06-21 NOTE — ED Notes (Signed)
Pt c/o "sores in her mom". States she has had thrush before and it feels like that. EDP aware.

## 2012-06-21 NOTE — ED Notes (Signed)
Pt eating hamburger and fries, drinking regular soda upon entering room. Pt educated in regards to watching diet.

## 2012-06-21 NOTE — ED Notes (Signed)
CRITICAL VALUE ALERT  Critical value received:  Glucose 594  Date of notification:  06/21/2012  Time of notification: 1935  Critical value read back: Yes  Nurse who received alert:  [id number]  MD notified Dr. Ignacia Palma  Time [phone]

## 2012-06-21 NOTE — H&P (Signed)
[name], [name]               ACCOUNT NO.:  [id number]  MEDICAL RECORD NO.:  [id number]  LOCATION:  [facility]05                         FACILITY:  [facility]  PHYSICIAN:  Melvyn Novas, MDDATE OF BIRTH:  1965-11-15  DATE OF ADMISSION:  06/21/2012 DATE OF DISCHARGE:  LH                             HISTORY & PHYSICAL   The patient is a 46 year old morbidly obese black female with a history of menorrhagia for 1 week's time.  She has multiple other comorbidities and was seen in the emergency room for the above problem, was found that she had very significant diabetes and blood glucose of 526, which was nonketotic, nonacidotic, and placed on an insulin infusion.  She specifically denies being insulin-dependent or even diabetic previously. She does admit to some polyuria, polydipsia, but denies nausea, vomiting, fever, chills.  She denies anginal chest pain.  The patient currently on insulin infusion will add Lantus insulin prior to bed tonight and have a.c. and bedtime glucoses monitored.  PAST MEDICAL HISTORY:  Significant for nonischemic cardiomyopathy; ejection fraction 25%; mitral regurgitation, moderate to severe;  normal coronary arteries; left bundle-branch block pattern; hypertension; asthma; irritable bowel syndrome; morbid obesity; GERD; iron deficiency anemia; secondary AVMs; anemia of chronic disease; also obstructive sleep apnea, on CPAP; GI bleed with a hemoglobin of 7.5; chronic dyspnea.  PAST SURGICAL HISTORY:  Remarkable for AICD, appendectomy, cholecystectomy secondary to biliary dyskinesia, left partial mastectomy, left knee arthroscopy and small bowel enteroscopy.  She has also had a colonoscopy and EGD.  CURRENT MEDICINES: 1. Albuterol sulfate nebulizer q.i.d. p.r.n. 2. Benazepril 40 mg p.o. daily. 3. Carvedilol 25 mg p.o. b.i.d. 4. Zyrtec 10 mg p.o. daily. 5. Cyclobenzaprine 10 mg p.o. t.i.d. 6. Ferrous sulfate 325 mg p.o. daily. 7. Gabapentin 300 mg  p.o. t.i.d. p.r.n. 8. Hydralazine 100 mg p.o. t.i.d. 9. Imdur 60 mg p.o. daily. 10.Prilosec 40 mg p.o. daily. 11.Potassium chloride 20 mEq 1 tablet 5 times daily. 12.Prenatal vitamins. 13.Torsemide 40 mg by mouth daily.  PHYSICAL EXAMINATION:  VITAL SIGNS:  Blood pressure currently is 125/77, pulse is 84 and regular, temperature 98, respiratory rate is 20, O2 sat is 100%.  HEENT:  Eyes PERRLA intact.  Sclerae clear.  Conjunctivae pink.  NECK:  No JVD.  No carotid bruits.  No thyromegaly or thyroid bruits.  LUNGS:  Diminished breath sounds at the bases.  No rales, wheeze, or rhonchi appreciable.  HEART:  Regular rhythm.  No murmurs, gallops, heaves, thrills, or rubs.  ABDOMEN:  Soft, nontender.  Bowel sounds normoactive.  No guarding, rebound, mass, or megaly.  EXTREMITIES:  1+ to 2+ pedal edema.  The patient moves all 4 extremities.  Plantars downgoing.  IMPRESSION: 1. New, severe nonketotic hyperosmolar state and diabetes. 2. Menorrhagia 1 week's duration. 3. Nonischemic cardiomyopathy. 4. Hypertension. 5. Automatic implantable cardioverter-defibrillator, pacer dependent. 6. Iron-deficiency anemia. 7. Asthma. 8. Gastroesophageal reflux disease. 9. Irritable bowel syndrome.  The plan is to admit as per orders.     Melvyn Novas, MD     RMD/MEDQ  D:  06/21/2012  T:  06/21/2012  Job:  [id number]

## 2012-06-21 NOTE — ED Notes (Signed)
Pt cramping unable to get up to use restroom at this time.

## 2012-06-21 NOTE — ED Notes (Signed)
Vag bleeding for 3-4 days, Yeast infection , using monistat.  abd cramps, "I hurt all over, I'm about to freeze".  Dr Jerene Bears pt here.

## 2012-06-21 NOTE — ED Notes (Signed)
AC notified to mix insulin drip for said pt.

## 2012-06-22 ENCOUNTER — Inpatient Hospital Stay (HOSPITAL_COMMUNITY): Payer: Medicare Other

## 2012-06-22 LAB — GLUCOSE, CAPILLARY
Glucose-Capillary: 145 mg/dL — ABNORMAL HIGH (ref 70–99)
Glucose-Capillary: 168 mg/dL — ABNORMAL HIGH (ref 70–99)
Glucose-Capillary: 168 mg/dL — ABNORMAL HIGH (ref 70–99)
Glucose-Capillary: 169 mg/dL — ABNORMAL HIGH (ref 70–99)
Glucose-Capillary: 223 mg/dL — ABNORMAL HIGH (ref 70–99)
Glucose-Capillary: 229 mg/dL — ABNORMAL HIGH (ref 70–99)
Glucose-Capillary: 431 mg/dL — ABNORMAL HIGH (ref 70–99)
Glucose-Capillary: 528 mg/dL — ABNORMAL HIGH (ref 70–99)

## 2012-06-22 LAB — BASIC METABOLIC PANEL
BUN: 11 mg/dL (ref 6–23)
Calcium: 7.9 mg/dL — ABNORMAL LOW (ref 8.4–10.5)
Chloride: 97 mEq/L (ref 96–112)
Creatinine, Ser: 0.61 mg/dL (ref 0.50–1.10)
GFR calc Af Amer: >90 mL/min (ref >90)
GFR calc Af Amer: >90 mL/min (ref >90)
GFR calc non Af Amer: >90 mL/min (ref >90)
GFR calc non Af Amer: >90 mL/min (ref >90)
Potassium: 3.2 mEq/L — ABNORMAL LOW (ref 3.5–5.1)
Sodium: 133 mEq/L — ABNORMAL LOW (ref 135–145)
Sodium: 128 mEq/L — ABNORMAL LOW (ref 135–145)

## 2012-06-22 LAB — CBC WITH DIFFERENTIAL/PLATELET
Basophils Absolute: 0 10*3/uL (ref 0.0–0.1)
Basophils Relative: 0 % (ref 0–1)
Eosinophils Relative: 3 % (ref 0–5)
HCT: 33 % — ABNORMAL LOW (ref 36.0–46.0)
MCHC: 32.4 g/dL (ref 30.0–36.0)
MCV: 78.8 fL (ref 78.0–100.0)
Monocytes Absolute: 0.2 10*3/uL (ref 0.1–1.0)
Platelets: 244 10*3/uL (ref 150–400)
RDW: 16.6 % — ABNORMAL HIGH (ref 11.5–15.5)
WBC: 4.4 10*3/uL (ref 4.0–10.5)

## 2012-06-22 LAB — HEPATIC FUNCTION PANEL
AST: 15 U/L (ref 0–37)
Albumin: 2.8 g/dL — ABNORMAL LOW (ref 3.5–5.2)
Total Bilirubin: 0.2 mg/dL — ABNORMAL LOW (ref 0.3–1.2)
Total Protein: 6.7 g/dL (ref 6.0–8.3)

## 2012-06-22 LAB — MRSA PCR SCREENING: MRSA by PCR: NEGATIVE

## 2012-06-22 MED ORDER — INSULIN ASPART 100 UNIT/ML ~~LOC~~ SOLN
15.0000 [IU] | Freq: Once | SUBCUTANEOUS | Status: AC
  Administered 2012-06-23: 15 [IU] via SUBCUTANEOUS

## 2012-06-22 MED ORDER — SODIUM CHLORIDE 0.9 % IV SOLN
INTRAVENOUS | Status: DC
  Administered 2012-06-23 – 2012-06-25 (×3): via INTRAVENOUS

## 2012-06-22 MED ORDER — INSULIN REGULAR HUMAN 100 UNIT/ML IJ SOLN: 15.0000 [IU] | Freq: Once | INTRAMUSCULAR | Status: DC

## 2012-06-22 MED ORDER — HYDROMORPHONE HCL PF 1 MG/ML IJ SOLN
1.0000 mg | INTRAMUSCULAR | Status: DC | PRN
  Administered 2012-06-22 – 2012-06-25 (×9): 1 mg via INTRAVENOUS
  Filled 2012-06-22: qty 15
  Filled 2012-06-22: qty 25
  Filled 2012-06-22: qty 1
  Filled 2012-06-22: qty 5
  Filled 2012-06-22 (×2): qty 1
  Filled 2012-06-22: qty 15
  Filled 2012-06-22: qty 20
  Filled 2012-06-22 (×2): qty 1
  Filled 2012-06-22: qty 15
  Filled 2012-06-22 (×4): qty 1
  Filled 2012-06-22: qty 15

## 2012-06-22 MED ORDER — SODIUM CHLORIDE 0.9 % IV SOLN
INTRAVENOUS | Status: DC
  Administered 2012-06-22: 06:00:00 via INTRAVENOUS
  Filled 2012-06-22: qty 1

## 2012-06-22 MED ORDER — INSULIN GLARGINE 100 UNIT/ML ~~LOC~~ SOLN
20.0000 [IU] | Freq: Every day | SUBCUTANEOUS | Status: DC
  Administered 2012-06-22 (×2): 20 [IU] via SUBCUTANEOUS

## 2012-06-22 MED ORDER — ONDANSETRON HCL 4 MG/2ML IJ SOLN
4.0000 mg | Freq: Three times a day (TID) | INTRAMUSCULAR | Status: DC | PRN
  Administered 2012-06-22 – 2012-06-25 (×4): 4 mg via INTRAVENOUS
  Filled 2012-06-22 (×5): qty 2

## 2012-06-22 MED ORDER — SODIUM CHLORIDE 0.9 % IV SOLN: INTRAVENOUS | Status: AC

## 2012-06-22 MED ORDER — INSULIN ASPART 100 UNIT/ML ~~LOC~~ SOLN
0.0000 [IU] | Freq: Every day | SUBCUTANEOUS | Status: DC
  Administered 2012-06-23: 4 [IU] via SUBCUTANEOUS

## 2012-06-22 MED ORDER — DOXYCYCLINE HYCLATE 100 MG IV SOLR
100.0000 mg | Freq: Two times a day (BID) | INTRAVENOUS | Status: DC
  Administered 2012-06-22 – 2012-06-23 (×4): 100 mg via INTRAVENOUS
  Filled 2012-06-22 (×5): qty 100

## 2012-06-22 MED ORDER — PNEUMOCOCCAL VAC POLYVALENT 25 MCG/0.5ML IJ INJ
0.5000 mL | INJECTION | INTRAMUSCULAR | Status: AC
  Administered 2012-06-23: 0.5 mL via INTRAMUSCULAR
  Filled 2012-06-22: qty 0.5

## 2012-06-22 MED ORDER — INSULIN ASPART 100 UNIT/ML ~~LOC~~ SOLN
0.0000 [IU] | Freq: Three times a day (TID) | SUBCUTANEOUS | Status: DC
  Administered 2012-06-23: 8 [IU] via SUBCUTANEOUS
  Administered 2012-06-23: 11 [IU] via SUBCUTANEOUS
  Administered 2012-06-23: 8 [IU] via SUBCUTANEOUS
  Administered 2012-06-24 (×3): 15 [IU] via SUBCUTANEOUS

## 2012-06-22 MED ORDER — INFLUENZA VIRUS VACC SPLIT PF IM SUSP
0.5000 mL | INTRAMUSCULAR | Status: AC
  Administered 2012-06-23: 0.5 mL via INTRAMUSCULAR
  Filled 2012-06-22: qty 0.5

## 2012-06-22 MED ORDER — ENOXAPARIN SODIUM 60 MG/0.6ML ~~LOC~~ SOLN
60.0000 mg | SUBCUTANEOUS | Status: DC
  Administered 2012-06-22 – 2012-06-24 (×3): 60 mg via SUBCUTANEOUS
  Filled 2012-06-22 (×3): qty 0.6

## 2012-06-22 MED ORDER — IPRATROPIUM BROMIDE 0.02 % IN SOLN: 0.5000 mg | RESPIRATORY_TRACT | Status: DC | PRN

## 2012-06-22 MED ORDER — DEXTROSE-NACL 5-0.45 % IV SOLN
INTRAVENOUS | Status: DC
  Administered 2012-06-22 (×2): via INTRAVENOUS

## 2012-06-22 MED ORDER — POTASSIUM CHLORIDE 10 MEQ/100ML IV SOLN: 10.0000 meq | INTRAVENOUS | Status: AC

## 2012-06-22 MED ORDER — DEXTROSE 50 % IV SOLN: 25.0000 mL | INTRAVENOUS | Status: DC | PRN

## 2012-06-22 MED ORDER — INSULIN GLARGINE 100 UNIT/ML ~~LOC~~ SOLN
25.0000 [IU] | Freq: Every day | SUBCUTANEOUS | Status: DC
  Administered 2012-06-23: 25 [IU] via SUBCUTANEOUS

## 2012-06-22 MED ORDER — INSULIN DETEMIR 100 UNIT/ML ~~LOC~~ SOLN
SUBCUTANEOUS | Status: AC
  Filled 2012-06-22: qty 10

## 2012-06-22 MED ORDER — DEXTROSE 5 % IV SOLN
1.0000 g | INTRAVENOUS | Status: DC
  Administered 2012-06-22 – 2012-06-23 (×2): 1 g via INTRAVENOUS
  Filled 2012-06-22 (×3): qty 10

## 2012-06-22 NOTE — Consult Note (Signed)
Reason for Consult  : Vaginal bleeding for the first time in over 3 years. Referring Physician: Dr. [name], admitted by Dr. Limmie Patricia  [name] is an 46 y.o. female. She is currently hospitalized for a new diagnosis of nondiabetic nonketotic hyperglycemia blood sugar in the 500s when admitted Friday. Medical management of her new onset diabetes is well documented elsewhere  GYN is notable for the patient having an Implanon placed 3-1/2 years ago at [facility] for contraceptive management, given her multiple medical  comorbidity. She did not keep her appointment earlier this year for have Implanon replacement. She had no period during that 3 years of Implanon use in the Implanon though still in place is past its functional lifetime and she's had this menstrual period Beginning Tuesday. Bleeding is described as moderate in severity. CT scan shows an unremarkable uterus tubes and ovaries the endometrial contour is not described Pertinent Gynecological History: Menses: Amenorrheic for 3 years on Implanon Bleeding: dysfunctional uterine bleeding Contraception: Nexplanon DES exposure: unknown Blood transfusions: none Sexually transmitted diseases: no past history Previous GYN Procedures:   Last mammogram:  Date:  Last pap: normal Date: 3 years ago OB History: G, P   Menstrual History: Menarche age:  No LMP recorded. Patient has had an implant.    Past Medical History  Diagnosis Date  . CHF (congestive heart failure)     chronic systolic CHF,  . Nonischemic cardiomyopathy     EF 25%  . Mitral regurgitation     moderate to severe  . S/P cardiac catheterization 10/06    normal coronary arteries  . LBBB (left bundle branch block)   . HTN (hypertension)   . Asthma   . IBS (irritable bowel syndrome)     with primarily constipation  . Morbid obesity   . GERD (gastroesophageal reflux disease)   . IDA (iron deficiency anemia)     2o TO SB AVMS, Hx parenteral iron Dr  Mariel Sleet  . AVM (arteriovenous malformation)     Small bowel, s/p duble balloon enteroscopy/APC jejunum Dr Gwinda Passe [facility]) 01/23/2011  . Anemia, chronic disease   . Peripheral neuropathy   . OSA (obstructive sleep apnea)     on CPAP qhs  . GI bleed 06/2009    Hgb 7.5, ferritin 7, transfusion, iron IV  . Dyspnea on exertion     chronic    Past Surgical History  Procedure Date  . Crdt-implantation 4/07    [employer]. remote- yes  . Appendectomy   . Cholecystectomy     biliary dyskinesia  . Mastectomy 2003    left, partial  . Knee arthroscopy 2005    left  . Small bowel enteroscopy MAY 2012 [facility] DR. GILLIAM    SB AVMS s/p APC  . Colonoscopy OCT 2010/SEP 2011    tortuous colon, 3 polyps-benign(2010),   . Upper gastrointestinal endoscopy '08, '10, SEP 2011    mild antral gastritis (2010), negative SB bx (2010), incomlpete Schatzki's ring (2011)  . Small bowel enteroscopy SEP 2011 [facility]    Fields-NO AVMS  . Givens capsule study NOV 2010     NO AVMS, normal  . Cardiac defibrillator placement   . Breast surgery     Family History  Problem Relation Age of Onset  . Coronary artery disease      premature CAD- no family Hx   . Colon cancer Neg Hx     no family Hx of polyps too  . Cervical cancer  Mother     Social History:  reports that she has quit smoking. Her smoking use included Cigarettes. She has a 10 pack-year smoking history. She does not have any smokeless tobacco history on file. She reports that she does not drink alcohol or use illicit drugs.  Allergies: No Known Allergies  Medications: I have reviewed the patient's current medications.  ROS  Blood pressure 88/44, pulse 80, temperature 98 F (36.7 C), temperature source Oral, resp. rate 21, height 5\' 6"  (1.676 m), weight 128.05 kg (282 lb 4.8 oz), SpO2 100.00%. Physical Exam patient is ill at this time and is actively vomiting. Pelvic ultrasound ordered    Ct Abdomen Pelvis Wo  Contrast  06/22/2012  *RADIOLOGY REPORT*  Clinical Data: Pelvic pain and heavy vaginal bleeding.  CT ABDOMEN AND PELVIS WITHOUT CONTRAST  Technique:  Multidetector CT imaging of the abdomen and pelvis was performed following the standard protocol without intravenous contrast.  Comparison: CT of the abdomen and pelvis 01/07/2004.  Findings:  Lung Bases: Cardiomegaly with left ventricular dilatation. Biventricular pacemaker / AICD with lead tips terminating in the right ventricular apex, right atrium and extending into the coronary vein via the coronary sinus.  Abdomen/Pelvis:  Tiny calcification in the posterior aspect of the liver likely represents a granuloma.  No focal liver lesions on this noncontrast CT examination.  Status post cholecystectomy.  The unenhanced appearance of the pancreas, spleen, bilateral adrenal glands and bilateral kidneys is unremarkable.  No significant volume of ascites.  No pneumoperitoneum.  No pathologic distension of small bowel.  A tiny umbilical hernia containing only omental fat is incidentally noted.  The unenhanced appearance of the uterus and bilateral ovaries is unremarkable in appearance.  Urinary bladder is nearly completely decompressed, but otherwise unremarkable in appearance.  Musculoskeletal: There are no aggressive appearing lytic or blastic lesions noted in the visualized portions of the skeleton.  IMPRESSION: 1.  No acute findings in the abdomen or pelvis to account for the patient's symptoms. 2.  Status post cholecystectomy. 3.  Cardiomegaly with biventricular pacemaker/AICD in place, as above.   Original Report Authenticated By: Florencia Reasons, M.D.    Dg Chest 2 View  06/21/2012  *RADIOLOGY REPORT*  Clinical Data: Weakness.  Dizziness.  Vaginal bleeding.  CHEST - 2 VIEW  Comparison: 05/09/2012.  Findings: Mild cardiomegaly is present.  Unchanged AICD / pacemaker apparatus. No airspace disease.  No effusion. Cholecystectomy clips are present in the right  upper quadrant.  Right AC joint osteoarthritis.  IMPRESSION: Cardiomegaly without acute cardiopulmonary disease.  Unchanged pacemaker / AICD.   Original Report Authenticated By: Andreas Newport, M.D.     Assessment/Plan: Vaginal bleeding after 3 years of amenorrhea while on Implanon, now status post expiration date of Implanon. Menses is likely return of ovarian function. Patient will need pelvic ultrasound, will need removal of Implanon and replacement as outpatient. If bleeding persists greater than a week or becomes excessive, will consider Megace 40 mg 3 times a day to suppress the bleeding while diabetic management is initiated Thank you for your consult   Tilda Burrow 06/22/2012

## 2012-06-22 NOTE — Progress Notes (Cosign Needed)
NAMECAYLEA, [name]               ACCOUNT NO.:  [id number]  MEDICAL RECORD NO.:  [id number]  LOCATION:  [facility]                         FACILITY:  [facility]  PHYSICIAN:  [name]. [name], M.D.DATE OF BIRTH:  1966/01/07  DATE OF PROCEDURE: DATE OF DISCHARGE:                                PROGRESS NOTE   SUBJECTIVE:  This patient was admitted to the hospital last night by Dr. Janna [name], on call for me, with diabetes.  She is currently on an insulin drip in the unit.  At the time of admission, her hemoglobin was stable at 12, even though she had been having heavy vaginal bleeding for 3 days.  More information from her shows she has had fever and chills for 3 or 4 days associated with the vaginal bleeding.  When the bleeding started, she had an itchy brownish vaginal discharge followed by heavy bleeding. She has had lower abdominal pain with this.  OBJECTIVE:  VITAL SIGNS:  Today she is in the ICU.  Temperature is 97.9, pulse 80, respiratory rate 21, blood pressure 92/53, but it was 106/71 earlier. She is somewhat recumbent in bed. HEART:  Regular rhythm with a rate of about 80. LUNGS:  Appear clear throughout.  She is moving air well.  She does have tenderness on palpation of the lower abdomen.  A wet prep from the genital area showed no yeast, Trichomonas, clue cells, and rare white cells.  Admission white cell count was 6000, hemoglobin 12.1.  Her sodium is 133, potassium 3.2, BUN 11, creatinine 0.65.  Glucose was 238.  Her urine pregnancy test is negative.  ASSESSMENT: 1. Question endometritis. 2. Diabetes. 3. Probable yeast vaginitis and oral candidiasis. 4. Morbid obesity. 5. Nonischemic cardiomyopathy, probably secondary to hypertensive     heart disease. 6. Congestive heart failure. 7. Automatic implantable defibrillator status.  PLAN:  I am starting her ceftriaxone and doxycycline IV.  A CT of the abdomen and pelvis is pending.  I have asked for GYN to see her in  consult.  We will replace her potassium, recheck a CBC and BMP today.  I have discussed her case with Dr. Janna [name].     [name]. [name], M.D.     SDK/MEDQ  D:  06/22/2012  T:  06/22/2012  Job:  [id number]

## 2012-06-22 NOTE — Progress Notes (Signed)
ANTICOAGULATION CONSULT NOTE - Initial Consult  Pharmacy Consult for Lovenox Indication: VTE prophylaxis  No Known Allergies  Patient Measurements: Height: 5\' 6"  (167.6 cm) Weight: 282 lb 4.8 oz (128.05 kg) IBW/kg (Calculated) : 59.3   Vital Signs: Temp: 97.9 F (36.6 C) (10/12 0745) Temp src: Oral (10/12 0745) BP: 109/63 mmHg (10/12 0600) Pulse Rate: 84  (10/12 0100)  Labs:  Basename 06/22/12 0450 06/21/12 1850 06/21/12 1756  HGB -- -- 12.1  HCT -- -- 36.8  PLT -- -- 295  APTT -- -- --  LABPROT -- -- --  INR -- -- --  HEPARINUNFRC -- -- --  CREATININE 0.65 0.74 --  CKTOTAL -- -- --  CKMB -- -- --  TROPONINI -- -- <0.30    Estimated Creatinine Clearance: 120.4 ml/min (by C-G formula based on Cr of 0.65).   Medical History: Past Medical History  Diagnosis Date  . CHF (congestive heart failure)     chronic systolic CHF,  . Nonischemic cardiomyopathy     EF 25%  . Mitral regurgitation     moderate to severe  . S/P cardiac catheterization 10/06    normal coronary arteries  . LBBB (left bundle branch block)   . HTN (hypertension)   . Asthma   . IBS (irritable bowel syndrome)     with primarily constipation  . Morbid obesity   . GERD (gastroesophageal reflux disease)   . IDA (iron deficiency anemia)     2o TO SB AVMS, Hx parenteral iron Dr Mariel Sleet  . AVM (arteriovenous malformation)     Small bowel, s/p duble balloon enteroscopy/APC jejunum Dr Gwinda Passe [facility]) 01/23/2011  . Anemia, chronic disease   . Peripheral neuropathy   . OSA (obstructive sleep apnea)     on CPAP qhs  . GI bleed 06/2009    Hgb 7.5, ferritin 7, transfusion, iron IV  . Dyspnea on exertion     chronic   Medications:  Scheduled:    . sodium chloride   Intravenous Once  . sodium chloride  1,000 mL Intravenous Once  . aspirin  81 mg Oral Daily  . benazepril  40 mg Oral Daily  . carvedilol  25 mg Oral BID WC  . enoxaparin (LOVENOX) injection  60 mg Subcutaneous Q24H  . furosemide   40 mg Oral BID  . hydrALAZINE  100 mg Oral Q8H  . influenza  inactive virus vaccine  0.5 mL Intramuscular Tomorrow-1000  . insulin aspart  10 Units Intravenous Once  . isosorbide mononitrate  60 mg Oral Daily  . pantoprazole  40 mg Oral Q1200  . pneumococcal 23 valent vaccine  0.5 mL Intramuscular Tomorrow-1000  . potassium chloride  10 mEq Intravenous Q1 Hr x 4  . potassium chloride  30 mEq Oral TID  . DISCONTD: insulin glargine  20 Units Subcutaneous QHS  . DISCONTD: insulin glargine  25 Units Subcutaneous QHS  . DISCONTD: ipratropium-albuterol  3 mL Nebulization Q4H  . DISCONTD: iron polysaccharides  150 mg Oral Daily    Assessment: 46yo obese female with good renal fxn.  Lovenox for VTE prophylaxis.  Goal of Therapy:  VTE prophylaxis Monitor platelets by anticoagulation protocol: Yes   Plan:  Lovenox 0.5mg /Kg sq q24hrs CBC per protocol  Valrie Hart A 06/22/2012,7:56 AM

## 2012-06-22 NOTE — ED Notes (Signed)
Pt transported to unit via stretcher. Pt remains on NS @ 125/hr and glucose stabilizer per order. Pt CBG rechecked, new results 551. Cherylann Ratel RN in step down aware of new results. Pt up to restroom, cleaned self up , ob pad and panties given to pt. Pt continues to have vaginal bleeding.  NAD noted. Pt remains asymptomatic secondary to hyperglycemia.

## 2012-06-22 NOTE — Progress Notes (Signed)
md notified of serum glucose of 562. New orders recieved

## 2012-06-23 ENCOUNTER — Inpatient Hospital Stay (HOSPITAL_COMMUNITY): Payer: Medicare Other

## 2012-06-23 LAB — URINE CULTURE: Colony Count: 40000

## 2012-06-23 LAB — BASIC METABOLIC PANEL
BUN: 7 mg/dL (ref 6–23)
GFR calc Af Amer: >90 mL/min (ref >90)
GFR calc non Af Amer: >90 mL/min (ref >90)
Potassium: 3.8 mEq/L (ref 3.5–5.1)
Sodium: 129 mEq/L — ABNORMAL LOW (ref 135–145)

## 2012-06-23 LAB — HEMOGLOBIN A1C
Hgb A1c MFr Bld: 13 % — ABNORMAL HIGH
Mean Plasma Glucose: 326 mg/dL — ABNORMAL HIGH

## 2012-06-23 LAB — HEPATIC FUNCTION PANEL
AST: 30 U/L (ref 0–37)
Albumin: 2.7 g/dL — ABNORMAL LOW (ref 3.5–5.2)
Bilirubin, Direct: 0.1 mg/dL (ref 0.0–0.3)
Total Protein: 6.7 g/dL (ref 6.0–8.3)

## 2012-06-23 LAB — GLUCOSE, CAPILLARY
Glucose-Capillary: 277 mg/dL — ABNORMAL HIGH (ref 70–99)
Glucose-Capillary: 308 mg/dL — ABNORMAL HIGH (ref 70–99)

## 2012-06-23 MED ORDER — SODIUM CHLORIDE 0.9 % IJ SOLN
INTRAMUSCULAR | Status: AC
  Filled 2012-06-23: qty 3

## 2012-06-23 MED ORDER — PROMETHAZINE HCL 25 MG/ML IJ SOLN
12.5000 mg | INTRAMUSCULAR | Status: DC | PRN
  Administered 2012-06-23 – 2012-06-25 (×5): 12.5 mg via INTRAVENOUS
  Filled 2012-06-23 (×5): qty 1

## 2012-06-23 MED ORDER — GABAPENTIN 300 MG PO CAPS
600.0000 mg | ORAL_CAPSULE | Freq: Every day | ORAL | Status: DC
  Administered 2012-06-23 – 2012-06-27 (×5): 600 mg via ORAL
  Filled 2012-06-23 (×4): qty 2

## 2012-06-23 MED ORDER — GABAPENTIN 300 MG PO CAPS
300.0000 mg | ORAL_CAPSULE | Freq: Every morning | ORAL | Status: DC
  Administered 2012-06-24 – 2012-06-27 (×4): 300 mg via ORAL
  Filled 2012-06-23 (×5): qty 1

## 2012-06-23 MED ORDER — INSULIN GLARGINE 100 UNIT/ML ~~LOC~~ SOLN
30.0000 [IU] | Freq: Every day | SUBCUTANEOUS | Status: DC
  Administered 2012-06-23: 30 [IU] via SUBCUTANEOUS

## 2012-06-23 MED ORDER — INSULIN ASPART 100 UNIT/ML ~~LOC~~ SOLN
15.0000 [IU] | Freq: Once | SUBCUTANEOUS | Status: AC
  Administered 2012-06-23: 15 [IU] via SUBCUTANEOUS

## 2012-06-23 MED ORDER — SODIUM CHLORIDE 0.9 % IJ SOLN
INTRAMUSCULAR | Status: AC
  Filled 2012-06-23: qty 6

## 2012-06-23 MED ORDER — METFORMIN HCL 500 MG PO TABS
500.0000 mg | ORAL_TABLET | Freq: Two times a day (BID) | ORAL | Status: DC
  Administered 2012-06-23 – 2012-06-25 (×5): 500 mg via ORAL
  Filled 2012-06-23 (×5): qty 1

## 2012-06-23 NOTE — Progress Notes (Signed)
NAMECAMARY, HAMADE               ACCOUNT NO.:  [id number]  MEDICAL RECORD NO.:  [id number]  LOCATION:  [facility]                         FACILITY:  [facility]  PHYSICIAN:  Melvyn Novas, MDDATE OF BIRTH:  November 08, 1965  DATE OF PROCEDURE: DATE OF DISCHARGE:                                PROGRESS NOTE   The patient admitted with menorrhagia of a day's duration.  Negative pregnancy test, morbid obesity, severe insulin with glucose over 500, nonketotic, hypokalemia, anemia, hemoglobin of 10.7.  Workup was negative in the ER.  Blood pressure 107/63, temperature 98.9, respiratory rate is 15.  Sodium down to 129, potassium repleted to 3.8 today, creatinine 0.75.  The patient had 25 units of Lantus added last night along with NovoLog sliding scale before meals and at bedtime. Random glucoses are in 2-300 range currently, will probably require increased amount of Lantus this evening and adding metformin 500 p.o. b.i.d. today.     Melvyn Novas, MD     RMD/MEDQ  D:  06/23/2012  T:  06/23/2012  Job:  [id number]

## 2012-06-23 NOTE — Progress Notes (Signed)
[id number] 

## 2012-06-23 NOTE — Progress Notes (Signed)
md notified of fingerstick of 524. New order recieved

## 2012-06-23 NOTE — Progress Notes (Signed)
Pt successively demonstrated drawing insulin and self injection.

## 2012-06-24 LAB — GLUCOSE, CAPILLARY: Glucose-Capillary: 431 mg/dL — ABNORMAL HIGH (ref 70–99)

## 2012-06-24 LAB — BASIC METABOLIC PANEL
BUN: 8 mg/dL (ref 6–23)
BUN: 8 mg/dL (ref 6–23)
CO2: 24 mEq/L (ref 19–32)
CO2: 28 mEq/L (ref 19–32)
Chloride: 100 mEq/L (ref 96–112)
Chloride: 95 mEq/L — ABNORMAL LOW (ref 96–112)
Creatinine, Ser: 0.69 mg/dL (ref 0.50–1.10)
GFR calc Af Amer: >90 mL/min (ref >90)
GFR calc non Af Amer: >90 mL/min (ref >90)
Glucose, Bld: 440 mg/dL — ABNORMAL HIGH (ref 70–99)
Potassium: 4.3 mEq/L (ref 3.5–5.1)
Sodium: 130 mEq/L — ABNORMAL LOW (ref 135–145)

## 2012-06-24 LAB — HEPATIC FUNCTION PANEL
ALT: 18 U/L (ref 0–35)
Alkaline Phosphatase: 91 U/L (ref 39–117)
Bilirubin, Direct: <0.1 mg/dL (ref 0.0–0.3)
Total Bilirubin: 0.2 mg/dL — ABNORMAL LOW (ref 0.3–1.2)

## 2012-06-24 LAB — CBC
MCHC: 30.9 g/dL (ref 30.0–36.0)
Platelets: 281 10*3/uL (ref 150–400)
RDW: 17.6 % — ABNORMAL HIGH (ref 11.5–15.5)

## 2012-06-24 MED ORDER — INSULIN GLARGINE 100 UNIT/ML ~~LOC~~ SOLN
40.0000 [IU] | Freq: Every day | SUBCUTANEOUS | Status: DC
  Administered 2012-06-24: 40 [IU] via SUBCUTANEOUS

## 2012-06-24 MED ORDER — INSULIN ASPART 100 UNIT/ML ~~LOC~~ SOLN
0.0000 [IU] | Freq: Three times a day (TID) | SUBCUTANEOUS | Status: DC
  Administered 2012-06-25: 15 [IU] via SUBCUTANEOUS
  Administered 2012-06-25: 11 [IU] via SUBCUTANEOUS
  Administered 2012-06-25 – 2012-06-26 (×2): 7 [IU] via SUBCUTANEOUS
  Administered 2012-06-26: 4 [IU] via SUBCUTANEOUS
  Administered 2012-06-26: 15 [IU] via SUBCUTANEOUS
  Administered 2012-06-27: 7 [IU] via SUBCUTANEOUS
  Administered 2012-06-27: 4 [IU] via SUBCUTANEOUS
  Administered 2012-06-27: 11 [IU] via SUBCUTANEOUS
  Administered 2012-06-28: 7 [IU] via SUBCUTANEOUS

## 2012-06-24 MED ORDER — INSULIN ASPART 100 UNIT/ML ~~LOC~~ SOLN
0.0000 [IU] | Freq: Every day | SUBCUTANEOUS | Status: DC
  Administered 2012-06-24: 5 [IU] via SUBCUTANEOUS
  Administered 2012-06-25 – 2012-06-26 (×2): 4 [IU] via SUBCUTANEOUS
  Administered 2012-06-27: 3 [IU] via SUBCUTANEOUS

## 2012-06-24 MED ORDER — LIVING WELL WITH DIABETES BOOK
Freq: Once | Status: AC
  Administered 2012-06-24: 15:00:00
  Filled 2012-06-24: qty 1

## 2012-06-24 MED ORDER — SODIUM CHLORIDE 0.9 % IJ SOLN
INTRAMUSCULAR | Status: AC
  Administered 2012-06-24: 15:00:00
  Filled 2012-06-24: qty 6

## 2012-06-24 MED ORDER — VANCOMYCIN HCL IN DEXTROSE 1-5 GM/200ML-% IV SOLN
1000.0000 mg | Freq: Three times a day (TID) | INTRAVENOUS | Status: DC
  Administered 2012-06-24 – 2012-06-27 (×11): 1000 mg via INTRAVENOUS
  Filled 2012-06-24 (×13): qty 200

## 2012-06-24 MED ORDER — SODIUM CHLORIDE 0.9 % IJ SOLN
INTRAMUSCULAR | Status: AC
  Filled 2012-06-24: qty 3

## 2012-06-24 MED ORDER — BD GETTING STARTED TAKE HOME KIT: 1/2ML X 30G SYRINGES
1.0000 | Freq: Once | Status: AC
  Administered 2012-06-24: 1
  Filled 2012-06-24: qty 1

## 2012-06-24 NOTE — Progress Notes (Signed)
[name], [name]               ACCOUNT NO.:  [id number]  MEDICAL RECORD NO.:  [id number]  LOCATION:  [facility]                         FACILITY:  [facility]  PHYSICIAN:  Mila Homer. [name], M.D.DATE OF BIRTH:  [birth year]  DATE OF PROCEDURE: DATE OF DISCHARGE:                                PROGRESS NOTE   SUBJECTIVE:  She is still having vaginal bleeding.  She has also had intermittent fever and chills.  She complains about an abscess she had just inferior to the right breast.  She had this in the past and it spontaneously ruptured.  Thick yellow material mixed with blood.  This is acting up again.  OBJECTIVE:  Temperature 98.3, pulse 86, respiratory rate 20, blood pressure 110/77.  She is sitting up in bed.  She is in no acute distress presently.  She is well-developed and morbidly obese.  Her heart has a regular rhythm, rate of about 80.  Her lungs are clear throughout.  She has a tender, slightly swollen area just inferior to the medial aspect of the right breast.  Today her white cell count is 4900, hemoglobin 11.2.  Sodium is 132, glucose 392.  The abdomen is soft and obese without organomegaly or mass but there is tenderness in the suprapubic region on palpation.  ASSESSMENT: 1. Question endometritis. 2. Thoracic wall abscess. 3. New onset diabetes. 4. Probable thrush. 5. Morbid obesity. 6. Congestive heart failure.  PLAN:  I have reviewed the notes of Dr. Emelda Fear and Dr. Janna Arch.  I have discontinued her ceftriaxone and doxycycline and switched her to vancomycin.  I am asking a general surgeon to see her concerning the abscess.  I am increasing her Lantus insulin from 30 to 40 units and continuing her sliding scale.  I discussed all this with the patient and a family member.     Mila Homer. [name], M.D.     SDK/MEDQ  D:  06/24/2012  T:  06/24/2012  Job:  [id number]

## 2012-06-24 NOTE — Progress Notes (Signed)
Surgery Filed Vitals:   06/24/12 1134  BP:   Pulse:   Temp: 98.2 F (36.8 C)  Resp:   BP 110/77 HR 86 Resp   22    46 yr old morbid obese diabetic Pt. With infected sebaceous cyst chest wall with out cellulitis and currently on IV antibiotics.  Asked to I&D and have made arrangements for that in AM.  She needs to be off her Lovenox and ASA for the next 24 HRS. This will be done under local.  Note: this pt is not candidate for gen. Anesthesia in this institution due to her cardiac status. Procedure discussed and consent obtained. I thank Dr. [name] for consult.

## 2012-06-24 NOTE — Progress Notes (Addendum)
E-Link MD DR Z updated on cbg=431 & confirmed with BMP glucose =440. Pt s/s changed to Resistant s/s & given pm coverage plus RX'd lantus   & glucophage

## 2012-06-24 NOTE — Progress Notes (Signed)
Noted that Dr. [name] changed IV fluids and increased Lantus to 40 units. Left message with Dr. [name] to increase sliding scale coverage.

## 2012-06-24 NOTE — Progress Notes (Signed)
Nutrition Consult DM diet education received.   Handouts entitled:CHO Counting for people with Diabetes given to pt for her review. Pt unwilling to disengage from her telephone conversation to receive diet education today.  Will follow-up in the morning to see if see has questions.  Recommend she follow-up with DM education class as outpatient.   #[phone]

## 2012-06-24 NOTE — Progress Notes (Signed)
Dr. [name] informed about patient blood glucose being 422. Explained about changing medicine and increasing sliding scale coverage to a high dose.

## 2012-06-24 NOTE — Care Management Note (Signed)
    Page 1 of 2   06/28/2012     10:06:16 AM   CARE MANAGEMENT NOTE 06/28/2012  Patient:  [name], [name]   Account Number:  [id number]  Date Initiated:  06/24/2012  Documentation initiated by:  Sharrie Rothman  Subjective/Objective Assessment:   Pt admitted from home with hyperglycemia and breast cyst. Pt lives with her daughter and fiance. Pt will return home at discharge. Pt currently has DM supplies and cpap, and neb machine. Pt has a CAP aide 1 hour a day M-F.     Action/Plan:   Pt requested a tub chair. Will arrange prior to discharge. Will also obtain nutritional consult. Pt may need HH at discharge for follow up for DM educations.   Anticipated DC Date:  06/27/2012   Anticipated DC Plan:  HOME W HOME HEALTH SERVICES      DC Planning Services  CM consult      Choice offered to / List presented to:     DME arranged  [employer]      DME agency  [facility]        Status of service:  Completed, signed off Medicare Important Message given?  YES (If response is "NO", the following Medicare IM given date fields will be blank) Date Medicare IM given:  06/27/2012 Date Additional Medicare IM given:    Discharge Disposition:  HOME/SELF CARE  Per UR Regulation:    If discussed at Long Length of Stay Meetings, dates discussed:   06/27/2012    Comments:  06/28/12 1000 Piero Mustard Leanord Hawking RN BSN CM [facility] notified of shower stool order to be delivered to home  06/24/12 1446 Arlyss Queen, Charity fundraiser BSN CM

## 2012-06-24 NOTE — Progress Notes (Signed)
ANTIBIOTIC CONSULT NOTE - INITIAL  Pharmacy Consult for Vancomycin Indication: chest wall abscess  No Known Allergies  Patient Measurements: Height: 5\' 6"  (167.6 cm) Weight: 282 lb 4.8 oz (128.05 kg) IBW/kg (Calculated) : 59.3  Adjusted Body Weight: 80Kg  Vital Signs: Temp: 98.3 F (36.8 C) (10/14 0725) Temp src: Oral (10/14 0725) BP: 110/77 mmHg (10/14 0806) Pulse Rate: 86  (10/14 0806) Intake/Output from previous day: 10/13 0701 - 10/14 0700 In: 5072.1 [P.O.:120; I.V.:4352.1; IV Piggyback:600] Out: 600 [Urine:600] Intake/Output from this shift: Total I/O In: 220 [P.O.:220] Out: -   Labs:  Basename 06/24/12 0504 06/23/12 0504 06/22/12 1036 06/21/12 1756  WBC 4.9 -- 4.4 6.0  HGB 11.2* -- 10.7* 12.1  PLT 281 -- 244 295  LABCREA -- -- -- --  CREATININE 0.69 0.75 0.61 --   Estimated Creatinine Clearance: 120.4 ml/min (by C-G formula based on Cr of 0.69). No results found for this basename: VANCOTROUGH:2,VANCOPEAK:2,VANCORANDOM:2,GENTTROUGH:2,GENTPEAK:2,GENTRANDOM:2,TOBRATROUGH:2,TOBRAPEAK:2,TOBRARND:2,AMIKACINPEAK:2,AMIKACINTROU:2,AMIKACIN:2, in the last 72 hours   Microbiology: Recent Results (from the past 720 hour(s))  URINE CULTURE     Status: Normal   Collection Time   06/21/12  6:46 PM      Component Value Range Status Comment   Specimen Description URINE, CLEAN CATCH   Final    Special Requests NONE   Final    Culture  Setup Time 06/22/2012 21:39   Final    Colony Count 40,000 COLONIES/ML   Final    Culture     Final    Value: GROUP B STREP(S.AGALACTIAE)ISOLATED     Note: TESTING AGAINST S. AGALACTIAE NOT ROUTINELY PERFORMED DUE TO PREDICTABILITY OF AMP/PEN/VAN SUSCEPTIBILITY.   Report Status 06/23/2012 FINAL   Final   WET PREP, GENITAL     Status: Abnormal   Collection Time   06/21/12  8:20 PM      Component Value Range Status Comment   Yeast Wet Prep HPF POC NONE SEEN  NONE SEEN Final    Trich, Wet Prep NONE SEEN  NONE SEEN Final    Clue Cells Wet  Prep HPF POC NONE SEEN  NONE SEEN Final    WBC, Wet Prep HPF POC RARE (*) NONE SEEN Final   MRSA PCR SCREENING     Status: Normal   Collection Time   06/22/12  2:45 AM      Component Value Range Status Comment   MRSA by PCR NEGATIVE  NEGATIVE Final    Medical History: Past Medical History  Diagnosis Date  . CHF (congestive heart failure)     chronic systolic CHF,  . Nonischemic cardiomyopathy     EF 25%  . Mitral regurgitation     moderate to severe  . S/P cardiac catheterization 10/06    normal coronary arteries  . LBBB (left bundle branch block)   . HTN (hypertension)   . Asthma   . IBS (irritable bowel syndrome)     with primarily constipation  . Morbid obesity   . GERD (gastroesophageal reflux disease)   . IDA (iron deficiency anemia)     2o TO SB AVMS, Hx parenteral iron Dr Mariel Sleet  . AVM (arteriovenous malformation)     Small bowel, s/p duble balloon enteroscopy/APC jejunum Dr Gwinda Passe [facility]) 01/23/2011  . Anemia, chronic disease   . Peripheral neuropathy   . OSA (obstructive sleep apnea)     on CPAP qhs  . GI bleed 06/2009    Hgb 7.5, ferritin 7, transfusion, iron IV  . Dyspnea on exertion  chronic   Medications:  Scheduled:    . aspirin  81 mg Oral Daily  . benazepril  40 mg Oral Daily  . carvedilol  25 mg Oral BID WC  . enoxaparin (LOVENOX) injection  60 mg Subcutaneous Q24H  . furosemide  40 mg Oral BID  . gabapentin  300 mg Oral q morning - 10a  . gabapentin  600 mg Oral QHS  . hydrALAZINE  100 mg Oral Q8H  . influenza  inactive virus vaccine  0.5 mL Intramuscular Tomorrow-1000  . insulin aspart  0-15 Units Subcutaneous TID WC  . insulin aspart  0-5 Units Subcutaneous QHS  . insulin glargine  40 Units Subcutaneous QHS  . isosorbide mononitrate  60 mg Oral Daily  . metFORMIN  500 mg Oral BID  . pantoprazole  40 mg Oral Q1200  . pneumococcal 23 valent vaccine  0.5 mL Intramuscular Tomorrow-1000  . potassium chloride  30 mEq Oral TID  . sodium  chloride      . sodium chloride      . sodium chloride      . vancomycin  1,000 mg Intravenous Q8H  . DISCONTD: cefTRIAXone (ROCEPHIN) IVPB 1 gram/50 mL D5W  1 g Intravenous Q24H  . DISCONTD: doxycycline (VIBRAMYCIN) IV  100 mg Intravenous Q12H  . DISCONTD: insulin glargine  25 Units Subcutaneous QHS  . DISCONTD: insulin glargine  30 Units Subcutaneous QHS   Assessment: 46yo morbidly obese female with chest wall abscess.  Good renal fxn.  Estimated Creatinine Clearance: 120.4 ml/min (by C-G formula based on Cr of 0.69).  Goal of Therapy:  Vancomycin trough level 10-15 mcg/ml Eradicate infection.  Plan: Vancomycin 1gm iv q8hrs Check trough at steady state Monitor labs, renal fxn, and cultures per protocol  Valrie Hart A 06/24/2012,10:24 AM

## 2012-06-24 NOTE — Progress Notes (Signed)
Patient blood glucose 412 informed E-link and explained about changing Sliding Scale to a higher dose. E-link Dr. Herma Carson informed.

## 2012-06-24 NOTE — Consult Note (Addendum)
Patient seen, chart reviewed. MS Weekley is now resolving her menses.  The ultrasound is normal for a premenopausal female. My plan is for her to follow up in office once discharged, to have Implanon removed and  Replaced. Patient willing to reschedule followup as previously arranged.     Will sign off . I will be happy to see pt again if needed.  Thank you   Christin Bach [phone] office [phone] cell [phone] pager

## 2012-06-24 NOTE — Progress Notes (Signed)
Inpatient Diabetes Program Recommendations  AACE/ADA: New Consensus Statement on Inpatient Glycemic Control (2013)  Target Ranges:  Prepandial:   less than 140 mg/dL      Peak postprandial:   less than 180 mg/dL (1-2 hours)      Critically ill patients:  140 - 180 mg/dL   Reason for Visit: Diabetes Coordinator Consult for new-onset diabetes  Inpatient Diabetes Program Recommendations Insulin - Basal: Lantus dosage increased to 40 units at HS. Correction (SSI): Current correction scale increased to moderate. Insulin - Meal Coverage: Currently no meal coverage ordered.  Patient ate 100% of breakfast.  CBG pattern indicates that meal coverage may be helpful in improving control.  Please consider starting Novolog 10 units tid with meals in addition to correction scale.  Oral Agents: Metformin 500 mg BID started-- will take about 6-8 weeks to realize impact of metformin on glycemic control. HgbA1C: Result is 13.0 indicating a mean glucose level of 326. Outpatient Referral: Has medicare and medicaid, but no transportation to OP Diabetes Education classes.  Sent information sheet regarding meal planning class offered at no charge at [facility].  Patient states she could go there.  Patient states she has access to the internet.  Will find some reliable educational sites for her to access after discharge. Diet: CHO Modified medium  Note:  Ordered the Living Well with Diabetes booklet.  Ordered a 1/2 cc insulin starter kit.  Ordered a dietitian consult.  Asked nursing staff to continue to educate patient regarding basic diabetes self-care and have patient watch DM videos 501 through 510 on the patient education channel.  Will need a glucose meter for home use.  Since patient has Medicare/Medicaid, will need an AccuChek brand meter since those meters are covered by Medicaid.  Thank you. Sam Wunschel S. Elsie Lincoln, RN, CNS, CDE  [phone]- team pager)

## 2012-06-25 ENCOUNTER — Encounter (HOSPITAL_COMMUNITY)
Admission: EM | Disposition: A | Discharge status: Home / Self Care | Payer: Self-pay | Source: Home / Self Care | Attending: Family Medicine

## 2012-06-25 ENCOUNTER — Encounter (HOSPITAL_COMMUNITY): Payer: Self-pay | Admitting: *Deleted

## 2012-06-25 HISTORY — PX: IRRIGATION AND DEBRIDEMENT ABSCESS: SHX5252

## 2012-06-25 LAB — GLUCOSE, CAPILLARY
Glucose-Capillary: 261 mg/dL — ABNORMAL HIGH (ref 70–99)
Glucose-Capillary: 219 mg/dL — ABNORMAL HIGH (ref 70–99)

## 2012-06-25 LAB — VANCOMYCIN, TROUGH: Vancomycin Tr: 11.6 ug/mL (ref 10.0–20.0)

## 2012-06-25 SURGERY — MINOR INCISION AND DRAINAGE OF ABSCESS
Anesthesia: LOCAL | Site: Chest | Wound class: Dirty or Infected

## 2012-06-25 MED ORDER — LIDOCAINE HCL (PF) 1 % IJ SOLN
INTRAMUSCULAR | Status: AC
  Filled 2012-06-25: qty 30

## 2012-06-25 MED ORDER — INSULIN GLARGINE 100 UNIT/ML ~~LOC~~ SOLN
50.0000 [IU] | Freq: Every day | SUBCUTANEOUS | Status: DC
  Administered 2012-06-25: 50 [IU] via SUBCUTANEOUS

## 2012-06-25 MED ORDER — OXYCODONE-ACETAMINOPHEN 5-325 MG PO TABS
1.0000 | ORAL_TABLET | ORAL | Status: DC | PRN
  Administered 2012-06-25 – 2012-06-27 (×8): 1 via ORAL
  Filled 2012-06-25 (×8): qty 1

## 2012-06-25 MED ORDER — DOCUSATE SODIUM 100 MG PO CAPS
100.0000 mg | ORAL_CAPSULE | Freq: Two times a day (BID) | ORAL | Status: DC
  Administered 2012-06-25 – 2012-06-27 (×6): 100 mg via ORAL
  Filled 2012-06-25 (×6): qty 1

## 2012-06-25 MED ORDER — SODIUM CHLORIDE 0.9 % IJ SOLN
INTRAMUSCULAR | Status: AC
  Filled 2012-06-25: qty 3

## 2012-06-25 MED ORDER — LIDOCAINE HCL 1 % IJ SOLN
INTRAMUSCULAR | Status: DC | PRN
  Administered 2012-06-25: 18 mg

## 2012-06-25 SURGICAL SUPPLY — 24 items
BAG HAMPER (MISCELLANEOUS) ×1 IMPLANT
BANDAGE CONFORM 2  STR LF (GAUZE/BANDAGES/DRESSINGS) ×1 IMPLANT
CLOTH BEACON ORANGE TIMEOUT ST (SAFETY) ×2 IMPLANT
COVER LIGHT HANDLE STERIS (MISCELLANEOUS) ×2 IMPLANT
ELECT REM PT RETURN 9FT ADLT (ELECTROSURGICAL) ×2
ELECTRODE REM PT RTRN 9FT ADLT (ELECTROSURGICAL) ×1 IMPLANT
GAUZE PACKING IODOFORM 1 (PACKING) ×1 IMPLANT
GLOVE SKINSENSE NS SZ7.0 (GLOVE) ×3
GLOVE SKINSENSE STRL SZ7.0 (GLOVE) ×3 IMPLANT
GOWN STRL REIN XL XLG (GOWN DISPOSABLE) ×4 IMPLANT
KIT ROOM TURNOVER APOR (KITS) ×1 IMPLANT
MANIFOLD NEPTUNE II (INSTRUMENTS) ×1 IMPLANT
MARKER SKIN DUAL TIP RULER LAB (MISCELLANEOUS) ×2 IMPLANT
NS IRRIG 1000ML POUR BTL (IV SOLUTION) ×2 IMPLANT
PACK BASIC LIMB (CUSTOM PROCEDURE TRAY) IMPLANT
PACK MINOR (CUSTOM PROCEDURE TRAY) ×1 IMPLANT
PAD ABD 5X9 TENDERSORB (GAUZE/BANDAGES/DRESSINGS) IMPLANT
PAD ARMBOARD 7.5X6 YLW CONV (MISCELLANEOUS) ×1 IMPLANT
SET BASIN LINEN APH (SET/KITS/TRAYS/PACK) IMPLANT
SPONGE GAUZE 4X4 12PLY (GAUZE/BANDAGES/DRESSINGS) ×3 IMPLANT
SWAB CULTURE LIQ STUART DBL (MISCELLANEOUS) ×1 IMPLANT
SYR BULB IRRIGATION 50ML (SYRINGE) ×1 IMPLANT
TAPE PAPER 2X10 WHT MICROPORE (GAUZE/BANDAGES/DRESSINGS) ×1 IMPLANT
WATER STERILE IRR 1000ML POUR (IV SOLUTION) ×2 IMPLANT

## 2012-06-25 NOTE — Progress Notes (Signed)
Report called to Michelle, RN ICU.

## 2012-06-25 NOTE — Op Note (Signed)
NAMECAMELLIA, [name]               ACCOUNT NO.:  [id number]  MEDICAL RECORD NO.:  [id number]  LOCATION:  [facility]OT[facility]                         FACILITY:  [facility]  PHYSICIAN:  Barbaraann Barthel, M.D. DATE OF BIRTH:  [birth year]  DATE OF PROCEDURE:  06/25/2012 DATE OF DISCHARGE:                              OPERATIVE REPORT   SURGEON:  Barbaraann Barthel, MD  PREOPERATIVE DIAGNOSIS:  Chest wall abscess.  SPECIMEN:  Cultures were obtained.  WOUND CLASSIFICATION:  Infected.  NOTE:  This is a 46 year old black female, obese, diabetic who presented with a tender area in the mid chest just medial to her right breast.  We discussed the need for an I and D of the suspected abscess and informed consent was obtained.  We discussed complications not limited to, but including bleeding and infection and we told that the wound would be packed open and require extensive wound care and told this healed by secondary intention.  Informed consent was obtained.  TECHNIQUE:  The patient was brought to the minor room and under aseptic conditions, an area was prepped with solution and draped in usual manner.  We used a topical numbing and Frigiderm type of anesthetic to numb the skin and then used 1% Xylocaine without epinephrine for local anesthesia.  Approximately 18 mL were used.  We then performed the I and D and after irrigating with normal saline solution and after obtaining a culture, we then packed the wound open with saline soaked 1-inch iodoform gauze.  The wound was then packed open and a sterile dressing was applied.  Prior to closure, all sponge, needle, and instrument counts were found to be correct.  Estimated blood loss was minimal.  The patient tolerated the procedure well and was taken to the postop room in satisfactory condition.     Barbaraann Barthel, M.D.     WB/MEDQ  D:  06/25/2012  T:  06/25/2012  Job:  [id number]  cc:   Mila Homer. [name], M.D. Fax: [phone]

## 2012-06-25 NOTE — Progress Notes (Signed)
  Nutrition Education Consult Follow-up  RD consulted for nutrition education regarding diabetes.   Lab Results  Component Value Date   HGBA1C 13.0* 06/21/2012    RD provided "Carbohydrate Counting for People with Diabetes" handout from the Academy of Nutrition and Dietetics. Discussed different food groups and their effects on blood sugar, emphasizing carbohydrate-containing foods. Provided list of carbohydrates and recommended serving sizes of common foods.  Discussed importance of controlled and consistent carbohydrate intake throughout the day. Provided examples of ways to balance meals/snacks and encouraged intake of high-fiber, whole grain complex carbohydrates.  Expect good compliance.  Body mass index is 46.33 kg/(m^2). Pt meets Obesity III criteria for  based on current BMI.  Current diet order is CHO Modified (1600-2000), patient is consuming approximately 50% of meals at this time. Labs and medications reviewed. No further nutrition interventions warranted at this time. RD contact information provided. If additional nutrition issues arise, please re-consult RD.  #[phone]

## 2012-06-25 NOTE — Progress Notes (Signed)
Pt received IV pain medicine around 1030, pt still rating pain 8 out of 10. Ice pack applied to area. Dr Malvin Johns paged and made aware. Order received for po Percocet q4hrs prn and to start pt on Colace BID. Will continue to monitor pt.

## 2012-06-25 NOTE — Progress Notes (Signed)
Surgery  Under aseptic conditions and 1% xylocaine without epi, I&D and debridement of chest abscess.  Wound cultured and packed open with saline soaked iodoform gauze.  Nursing staff can irrigate and pack wound daily.  Pt. Tolerated procedure well.  She is returned to Dr Michelle Nasuti service withoutt change to meds, diet or activity.  Will follow up as needed.  Thanks

## 2012-06-25 NOTE — Progress Notes (Signed)
Pt returned from procedure at 1030. Dsg dry and intact. Pt given 1 mg iv dilaudid and 4mg  zofran upon arrival. Pt resting, with fiance at bedside.

## 2012-06-25 NOTE — Progress Notes (Signed)
Inpatient Diabetes Program Recommendations  AACE/ADA: New Consensus Statement on Inpatient Glycemic Control (2013)  Target Ranges:  Prepandial:   less than 140 mg/dL      Peak postprandial:   less than 180 mg/dL (1-2 hours)      Critically ill patients:  140 - 180 mg/dL   Reason for Visit: Follow-up on newly diagnosed diabetes patient  Inpatient Diabetes Program Recommendations Insulin - Basal: CBG this morning improved at 261 mg/dl with increase in basal insulin dosage. Correction (SSI): Note that correction scale has been increased to resistant scale. Insulin - Meal Coverage: Currently no meal coverage ordered.  Patient ate 100% of breakfast.  CBG pattern indicates that meal coverage may be helpful in improving control.  Please consider starting Novolog 10 units tid with meals in addition to correction scale.  Oral Agents: Metformin 500 mg BID started-- will take about 6-8 weeks to realize impact of metformin on glycemic control. HgbA1C: Result is 13.0 indicating a mean glucose level of 326. Outpatient Referral: Requested nursing staff write the following website inside cover of patient's diabetes booklet:  [email or web address]  Diet: Yesterday ate 90 - !00%  Note: Left sticky note for physician regarding need for specific meter brand at discharge.  Thank you.  Esparanza Krider S. Elsie Lincoln, RN, CNS, CDE [phone] team pager)

## 2012-06-25 NOTE — Brief Op Note (Signed)
06/21/2012 - 06/25/2012  9:37 AM  PATIENT:  [name]  46 y.o. female  PRE-OPERATIVE DIAGNOSIS:  sebaceous cyst chest  POST-OPERATIVE DIAGNOSIS:  * No post-op diagnosis entered *  PROCEDURE:  Procedure(s) (LRB) with comments: MINOR INCISION AND DRAINAGE OF ABSCESS ([name]) - Incision & Drainage of Infected Sebaceous Cyst on Chest  SURGEON:  Surgeon(s) and Role:    * [name], MD - Primary  PHYSICIAN ASSISTANT:   ASSISTANTS: none   ANESTHESIA:   local  EBL:  Total I/O In: 50 [I.V.:50] Out: -   BLOOD ADMINISTERED:none  DRAINS: none   LOCAL MEDICATIONS USED:  XYLOCAINE  xylocaine without epi ~18 cc.  SPECIMEN:  Source of Specimen:  cultures from chest wall.  DISPOSITION OF SPECIMEN:  PATHOLOGY  COUNTS:  YES  TOURNIQUET:  * No tourniquets in log *  DICTATION: .Other Dictation: Dictation Number OR Dict.# [id number].  PLAN OF CARE: Admit to inpatient   PATIENT DISPOSITION:  PACU - hemodynamically stable.   Delay start of Pharmacological VTE agent (>24hrs) due to surgical blood loss or risk of bleeding: yes

## 2012-06-25 NOTE — Progress Notes (Signed)
ANTIBIOTIC CONSULT NOTE   Pharmacy Consult for Vancomycin Indication: chest wall abscess  No Known Allergies  Patient Measurements: Height: 5\' 6"  (167.6 cm) Weight: 287 lb 0.6 oz (130.2 kg) IBW/kg (Calculated) : 59.3  Adjusted Body Weight: 80Kg  Vital Signs: Temp: 98.7 F (37.1 C) (10/15 0858) Temp src: Oral (10/15 0858) BP: 116/75 mmHg (10/15 1100) Pulse Rate: 86  (10/15 0858) Intake/Output from previous day: 10/14 0701 - 10/15 0700 In: 2370.8 [P.O.:700; I.V.:1070.8; IV Piggyback:600] Out: 4300 [Urine:4300] Intake/Output from this shift: Total I/O In: 150 [I.V.:150] Out: -   Labs:  Basename 06/24/12 2141 06/24/12 0504 06/23/12 0504  WBC -- 4.9 --  HGB -- 11.2* --  PLT -- 281 --  LABCREA -- -- --  CREATININE 0.78 0.69 0.75   Estimated Creatinine Clearance: 121.7 ml/min (by C-G formula based on Cr of 0.78).  Basename 06/25/12 1135  VANCOTROUGH 11.6  VANCOPEAK --  VANCORANDOM --  GENTTROUGH --  GENTPEAK --  GENTRANDOM --  TOBRATROUGH --  TOBRAPEAK --  TOBRARND --  AMIKACINPEAK --  AMIKACINTROU --  AMIKACIN --    Microbiology: Recent Results (from the past 720 hour(s))  URINE CULTURE     Status: Normal   Collection Time   06/21/12  6:46 PM      Component Value Range Status Comment   Specimen Description URINE, CLEAN CATCH   Final    Special Requests NONE   Final    Culture  Setup Time 06/22/2012 21:39   Final    Colony Count 40,000 COLONIES/ML   Final    Culture     Final    Value: GROUP B STREP(S.AGALACTIAE)ISOLATED     Note: TESTING AGAINST S. AGALACTIAE NOT ROUTINELY PERFORMED DUE TO PREDICTABILITY OF AMP/PEN/VAN SUSCEPTIBILITY.   Report Status 06/23/2012 FINAL   Final   WET PREP, GENITAL     Status: Abnormal   Collection Time   06/21/12  8:20 PM      Component Value Range Status Comment   Yeast Wet Prep HPF POC NONE SEEN  NONE SEEN Final    Trich, Wet Prep NONE SEEN  NONE SEEN Final    Clue Cells Wet Prep HPF POC NONE SEEN  NONE SEEN Final    WBC, Wet Prep HPF POC RARE (*) NONE SEEN Final   MRSA PCR SCREENING     Status: Normal   Collection Time   06/22/12  2:45 AM      Component Value Range Status Comment   MRSA by PCR NEGATIVE  NEGATIVE Final    Medical History: Past Medical History  Diagnosis Date  . CHF (congestive heart failure)     chronic systolic CHF,  . Nonischemic cardiomyopathy     EF 25%  . Mitral regurgitation     moderate to severe  . S/P cardiac catheterization 10/06    normal coronary arteries  . LBBB (left bundle branch block)   . HTN (hypertension)   . Asthma   . IBS (irritable bowel syndrome)     with primarily constipation  . Morbid obesity   . GERD (gastroesophageal reflux disease)   . IDA (iron deficiency anemia)     2o TO SB AVMS, Hx parenteral iron Dr Mariel Sleet  . AVM (arteriovenous malformation)     Small bowel, s/p duble balloon enteroscopy/APC jejunum Dr Gwinda Passe [facility]) 01/23/2011  . Anemia, chronic disease   . Peripheral neuropathy   . OSA (obstructive sleep apnea)     on CPAP qhs  . GI  bleed 06/2009    Hgb 7.5, ferritin 7, transfusion, iron IV  . Dyspnea on exertion     chronic   Medications:  Scheduled:     . bd getting started take home kit  1 kit Other Once  . benazepril  40 mg Oral Daily  . carvedilol  25 mg Oral BID WC  . docusate sodium  100 mg Oral BID  . furosemide  40 mg Oral BID  . gabapentin  300 mg Oral q morning - 10a  . gabapentin  600 mg Oral QHS  . hydrALAZINE  100 mg Oral Q8H  . insulin aspart  0-20 Units Subcutaneous TID WC  . insulin aspart  0-5 Units Subcutaneous QHS  . insulin glargine  40 Units Subcutaneous QHS  . isosorbide mononitrate  60 mg Oral Daily  . living well with diabetes book   Does not apply Once  . metFORMIN  500 mg Oral BID  . pantoprazole  40 mg Oral Q1200  . potassium chloride  30 mEq Oral TID  . sodium chloride      . sodium chloride      . sodium chloride      . vancomycin  1,000 mg Intravenous Q8H  . DISCONTD: insulin  aspart  0-15 Units Subcutaneous TID WC  . DISCONTD: insulin aspart  0-5 Units Subcutaneous QHS   Assessment: 46yo morbidly obese female with chest wall abscess.  Good renal fxn.  Estimated Creatinine Clearance: 121.7 ml/min (by C-G formula based on Cr of 0.78).  Trough level is on target.  Goal of Therapy:  Vancomycin trough level 10-15 mcg/ml Eradicate infection.  Plan: Continue Vancomycin 1gm iv q8hrs Check trough at steady state Monitor labs, renal fxn, and cultures per protocol  Valrie Hart A 06/25/2012,12:42 PM

## 2012-06-25 NOTE — Progress Notes (Signed)
Patient left unit at  0830 for i+d and excision of cyst under right breast. VSS at transfer.

## 2012-06-25 NOTE — Progress Notes (Deleted)
46 yr old w. Female with lung CA for placement of porta cath for chemo. Procedure and risks explained and informed consent obtained and labs reviewed. All questions answered and there are no acute changes since H&P.  Filed Vitals:    06/25/12 0600   BP:  127/76   Pulse:  78   Temp:    Resp:  20   98.7  

## 2012-06-26 LAB — GLUCOSE, CAPILLARY
Glucose-Capillary: 305 mg/dL — ABNORMAL HIGH (ref 70–99)
Glucose-Capillary: 239 mg/dL — ABNORMAL HIGH (ref 70–99)
Glucose-Capillary: 199 mg/dL — ABNORMAL HIGH (ref 70–99)

## 2012-06-26 LAB — CBC
Hemoglobin: 10.3 g/dL — ABNORMAL LOW (ref 12.0–15.0)
MCH: 25.9 pg — ABNORMAL LOW (ref 26.0–34.0)
MCHC: 31.8 g/dL (ref 30.0–36.0)
Platelets: 248 10*3/uL (ref 150–400)
RBC: 3.97 MIL/uL (ref 3.87–5.11)

## 2012-06-26 MED ORDER — INSULIN GLARGINE 100 UNIT/ML ~~LOC~~ SOLN
60.0000 [IU] | Freq: Every day | SUBCUTANEOUS | Status: DC
  Administered 2012-06-26 – 2012-06-27 (×2): 60 [IU] via SUBCUTANEOUS

## 2012-06-26 MED ORDER — SODIUM CHLORIDE 0.9 % IJ SOLN
INTRAMUSCULAR | Status: AC
  Administered 2012-06-26: 10 mL
  Filled 2012-06-26: qty 3

## 2012-06-26 NOTE — Progress Notes (Signed)
Surgery  POD # 1  Filed Vitals:   06/26/12 0912  BP: 136/83  Pulse:   Temp:   Resp:   temp 98.2 pulse 73  Wound is clean.  Packing was removed and wound was irrigated  With NS soln. And repacked with saline soaked gauze.  Dressing care explained to care giver--I doubt home health is necessary for this easily accessible wound. Culture is pending.  Pt. Can be discharged with BID dressing changes by family and I will follow up in office to make sure.  Discharge by med. Service after her diabetes is under control.  ( BS 305 this AM.) Will sign off at present.  Thanks for consult

## 2012-06-26 NOTE — Progress Notes (Signed)
IV site infiltrated, area swollen and tender to touch, IV site removed, area marked around infiltration, heat applied to area. Dr. Renard Matter on floor, spoke with in regards to infiltration being near defibrillator, states to make day time nurse so that Dr. [name] can follow up on it and to place patient on telemetry.

## 2012-06-26 NOTE — Progress Notes (Signed)
[name], [name]               ACCOUNT NO.:  [id number]  MEDICAL RECORD NO.:  [id number]  LOCATION:  A302                          FACILITY:  [facility]  PHYSICIAN:  Mila Homer. [name], M.D.DATE OF BIRTH:  10-Aug-1966  DATE OF PROCEDURE: DATE OF DISCHARGE:                                PROGRESS NOTE   SUBJECTIVE:  A 46 year old who was feeling better tonight.  She did have an I and D through Dr. Malvin [name], general surgeon, this morning.  I reviewed the records from the office at some length.  She had an A1c of 6.3% in August 2012 and in May 2013 she had an A1c of 6.9%.  She has never had her sugar up like this before, but she has checked her sugar twice a day with fingersticks and on at least 1 occasion has had a sugar of 257.  She generally feels better.  OBJECTIVE:  VITAL SIGNS:  Temperature is 98.2, pulse 86, respiratory rate 14, blood pressure 106/65. GENERAL:  She is semirecumbent in bed.  She appears to be alert and oriented. SKIN:  Inspected her I and D site, which looked good.  Her skin turgor was normal.  ASSESSMENT: 1. Infected sebaceous cyst of the chest wall. 2. Uncontrolled type 2 diabetes. 3. Morbid obesity. 4. Congestive heart failure.  PLAN:  I am increasing her Lantus from 40 to 50 units at night and continuing her sliding scale.  I am transferring her from the ICU to a med/surgical floor either tonight or in the morning.  I have stopped her IV and switched to a saline lock.  She will continue vancomycin at this point.  I talked to her about her diet.  Given her congestive heart failure, she is somewhat sedentary and I cannot expect her to use more than 1500-2000 calories a day.  To lose substantial weight and control her diabetes, she may need to be taking in no more than 940 842 0420 calories daily.  She understands this.  Meanwhile, her bleeding appears to be stable and is followed by GYN.     Mila Homer. [name], M.D.     SDK/MEDQ  D:   06/25/2012  T:  06/26/2012  Job:  [id number]

## 2012-06-27 LAB — GLUCOSE, CAPILLARY: Glucose-Capillary: 203 mg/dL — ABNORMAL HIGH (ref 70–99)

## 2012-06-27 MED ORDER — SODIUM CHLORIDE 0.9 % IJ SOLN
INTRAMUSCULAR | Status: AC
Start: 2012-06-27 — End: 2012-06-27
  Administered 2012-06-27: 10 mL
  Filled 2012-06-27: qty 3

## 2012-06-27 MED ORDER — SODIUM CHLORIDE 0.9 % IJ SOLN
INTRAMUSCULAR | Status: AC
  Administered 2012-06-27: 10 mL
  Filled 2012-06-27: qty 3

## 2012-06-27 MED ORDER — SODIUM CHLORIDE 0.9 % IJ SOLN
INTRAMUSCULAR | Status: AC
Start: 2012-06-27 — End: 2012-06-27
  Administered 2012-06-27: 13:00:00
  Filled 2012-06-27: qty 3

## 2012-06-27 MED ORDER — SODIUM CHLORIDE 0.9 % IJ SOLN
INTRAMUSCULAR | Status: AC
  Administered 2012-06-27: 11:00:00
  Filled 2012-06-27: qty 3

## 2012-06-27 NOTE — Progress Notes (Signed)
IV site no longer functional. IV insertion attempted multiple times by 2 nurses without access. MD on call notified and orders given to hold IV until AM and reevaluate.

## 2012-06-27 NOTE — Progress Notes (Signed)
NAMEBAYLEY, YARBOROUGH               ACCOUNT NO.:  [id number]  MEDICAL RECORD NO.:  [id number]  LOCATION:  A302                          FACILITY:  [facility]  PHYSICIAN:  Mila Homer. Sudie [name], M.D.DATE OF BIRTH:  Jul 12, 1966  DATE OF PROCEDURE: DATE OF DISCHARGE:                                PROGRESS NOTE   SUBJECTIVE:  Still feels somewhat sore particularly in her left shoulder where she had an IV infiltrate.  OBJECTIVE:  GENERAL:  She is sitting up in bed.  Her significant other is with her. VITAL SIGNS:  Temperature 98.2, pulse 76, respiratory rate 17, blood pressure 136/83. HEART:  Regular rhythm and rate of 70. LUNGS:  Clear throughout and there is no edema of the ankles.  There is some puffiness and a little bit of erythema in the left shoulder consistent with an infiltrated IV.  Her wound culture shows no growth at this point.  Her white cell count is 5300 with a hemoglobin 10.3.  Her glucoses in the last day have been 323, 219, 314 and 305, which is definitely improved from the 400 and 500 range she had initially.  ASSESSMENT: 1. Infected sebaceous cyst. 2. Diabetes, better controlled. 3. Morbid obesity. 4. Congestive heart failure, stable.  PLAN:  She is to continue vancomycin IV.  She is now on a saline lock. I have increased her Lantus from 50 to 60 units at bedtime.  I will need to have her go home on this plus sliding scale insulin.  She may be ready to go home tomorrow.  We talked to Dr. Christin Bach, her Gynecologist.  The vaginal bleeding was probably secondary to her not having the Implanon insert refilled when it needed to be a 1/2 year ago.     Mila Homer. Sudie [name], M.D.    SDK/MEDQ  D:  06/26/2012  T:  06/27/2012  Job:  [id number]

## 2012-06-27 NOTE — Progress Notes (Signed)
Pt. Ambulated in hall approximately 300 ft. Without assistance. Pt. Tolerated well, no SOB, vitals WDL.

## 2012-06-27 NOTE — Progress Notes (Signed)
[name], [name]               ACCOUNT NO.:  [id number]  MEDICAL RECORD NO.:  [id number]  LOCATION:  A302                          FACILITY:  [facility]  PHYSICIAN:  Mila Homer. [name], M.D.DATE OF BIRTH:  [birth year]  DATE OF PROCEDURE: DATE OF DISCHARGE:                                PROGRESS NOTE   SUBJECTIVE:  She is feeling better but still has a good deal of pain in the right chest wall where she had her incision and drainage.  OBJECTIVE:  VITAL SIGNS:  Temperature is 99 degrees, pulse 77, respiratory rate 20, blood pressure 119/74. GENERAL:  She is semi-recumbent in bed.  She is well-developed and morbidly obese.  She is in no acute distress.  Sugars have been at a high of 305 in the last day, a low of 171 this morning.  ASSESSMENT: 1. Infected sebaceous cyst, now of drained. 2. Type 2 diabetes currently on insulin therapy, improved. 3. Morbid obesity. 4. Congestive heart failure, stable.  PLAN:  I am increasing her Lantus more tonight.  Nursing is going to have the patient check her sugars, draw up insulin and inject the insulin to make sure she is able to do the sliding scale when she goes home tomorrow morning.  I have discussed with her personal nurse in detail and also with Discharge Planning.     Mila Homer. [name], M.D.     SDK/MEDQ  D:  06/27/2012  T:  06/27/2012  Job:  [id number]

## 2012-06-28 ENCOUNTER — Ambulatory Visit (INDEPENDENT_AMBULATORY_CARE_PROVIDER_SITE_OTHER): Payer: Medicare Other | Admitting: *Deleted

## 2012-06-28 ENCOUNTER — Encounter: Payer: Self-pay | Admitting: Internal Medicine

## 2012-06-28 ENCOUNTER — Encounter (HOSPITAL_COMMUNITY): Payer: Self-pay | Admitting: General Surgery

## 2012-06-28 DIAGNOSIS — I5022 Chronic systolic (congestive) heart failure: Secondary | ICD-10-CM

## 2012-06-28 DIAGNOSIS — I428 Other cardiomyopathies: Secondary | ICD-10-CM

## 2012-06-28 LAB — ICD DEVICE OBSERVATION
AL IMPEDENCE ICD: 497 Ohm
HV IMPEDENCE: 52 Ohm
LV LEAD THRESHOLD: 1.5 V
RV LEAD AMPLITUDE: 22.7 mv
RV LEAD IMPEDENCE ICD: 554 Ohm
RV LEAD THRESHOLD: 0.6 V
TZON-0003FASTVT: 300 ms

## 2012-06-28 LAB — CULTURE, ROUTINE-ABSCESS

## 2012-06-28 LAB — GLUCOSE, CAPILLARY: Glucose-Capillary: 211 mg/dL — ABNORMAL HIGH (ref 70–99)

## 2012-06-28 LAB — CBC
MCH: 26.1 pg (ref 26.0–34.0)
MCHC: 32 g/dL (ref 30.0–36.0)
Platelets: 294 10*3/uL (ref 150–400)
RBC: 4.14 MIL/uL (ref 3.87–5.11)

## 2012-06-28 MED ORDER — OXYCODONE-ACETAMINOPHEN 5-325 MG PO TABS: 1.0000 | ORAL_TABLET | ORAL | Status: DC | PRN

## 2012-06-28 MED ORDER — CEFUROXIME AXETIL 250 MG PO TABS: 500.0000 mg | ORAL_TABLET | Freq: Two times a day (BID) | ORAL | Status: DC

## 2012-06-28 MED ORDER — INSULIN GLARGINE 100 UNIT/ML ~~LOC~~ SOLN: 80.0000 [IU] | Freq: Every day | SUBCUTANEOUS | Status: DC

## 2012-06-28 MED ORDER — INSULIN ASPART 100 UNIT/ML ~~LOC~~ SOLN: 0.0000 [IU] | Freq: Three times a day (TID) | SUBCUTANEOUS | Status: AC

## 2012-06-28 NOTE — Progress Notes (Signed)
ICD check 

## 2012-06-28 NOTE — Patient Instructions (Addendum)
Return office visit 09/16/12 @ 10:30am with Dr. [name].

## 2012-06-28 NOTE — Discharge Summary (Signed)
NAMEAUGUSTINE, BRANNICK               ACCOUNT NO.:  [id number]  MEDICAL RECORD NO.:  [id number]  LOCATION:  A302                          FACILITY:  [facility]  PHYSICIAN:  Mila Homer. [name], M.D.DATE OF BIRTH:  20-Jun-1966  DATE OF ADMISSION:  06/21/2012 DATE OF DISCHARGE:  10/18/2013LH                              DISCHARGE SUMMARY   A 46 year old who presented to the hospital with menorrhagia.  She was found to have a glucose of 526 and was admitted to the hospital.  She had an 8-day hospitalization extending from October 11 to June 28, 2012.  Vital signs remained stable.  For the first 4 or 5 days of her admission she was in the ICU on an insulin drip.  Once her she was off this and she was on sliding scale insulin and Lantus, she was able to be transferred to a medical/surgical floor.  It was initially thought that she had endometritis because at the same time she was having fever, chills, lower abdominal pain, and vaginal bleeding, but it turned out that she had had an Implanon insert which had been in place for 3-1/2 years.  Its length of chemical activity was only 3 years, however.  She was seen by Dr. Emelda Fear, gynecologist, who felt that the low abdominal pain and bleeding were just secondary to the insert not functioning any further and a heavy menstrual period.  She was also found to have right chest wall tenderness which turned out to be an abscess probably of a sebaceous cyst.  Dr. Malvin Johns, general surgeon, saw her and performed an I and D.  Culture this grew out multiple species of bacteria.  Also noted in the hospital she had a potassium of 3.2, an albumin of 2.8.  Her hemoglobin was 10.7 and stayed around that value.  Her hypokalemia was treated with potassium replacement and she did well with that.  Of note, the hemoglobin initially was 12.1, then dropped down to the 10 range when she was rehydrated.  Her A1c was 13.0, which is up from 6.9% in June.  Given  this and really no change in her weight or diet status, I felt that a good deal of her elevated sugars had to do with the infection of her chest wall.  Sugars stayed in the 400-500 range initially and gradually on a resistant NovoLog sliding scale insulin and on increased Lantus up to 60 and then 70 units at bedtime, dropped down into the 200 range.  She learned how to give injections of insulin while she was in the ICU and then while she was on the floor she learned how to check her sugar, draw up insulin, and inject herself based on a sliding scale regimen.  By her 8th day she was ready for discharge home.  DISCHARGE MEDICATIONS: 1. Albuterol inhaler. 2. Benazepril 40 mg daily. 3. Carvedilol 25 mg b.i.d. 4. Cetirizine 10 mg daily for allergies. 5. Cyclobenzaprine 10 mg t.i.d. for muscle spasm. 6. Ferrous sulfate 325 mg t.i.d. 7. Gabapentin 300 mg up to 1 capsule in the morning and 2 at night. 8. Hydralazine 100 mg t.i.d. 9. Isosorbide 60 mg t.i.d. 10.Omeprazole 40 mg daily. 11.Potassium chloride 20  mEq 5 times a day. 12.Torsemide 20 mg 2 daily. 13.Albuterol by nebulizer as needed 14.I prescribed her NovoLog insulin used by sliding scale 3 times a     day with meals, 10 mL with 5 refills, and Lantus insulin 80 units     q.h.s. (10 pens with 11 refills).  She is to increase her dose by     10 units every 2 days as long as her fasting sugar remains above     100. 15.She was also prescribed oxycodone/APAP 5/325 to use every 4 hours     for pain (60 with no refills).  FOLLOW-UP:  She is to see Dr. Malvin Johns, general surgeon, for follow-up for her wound.  She is to see me in follow-up within 10 days of discharge or before then if needed.  FINAL DISCHARGE DIAGNOSES: 1. Uncontrolled type 2 diabetes. 2. Morbid obesity. 3. Infected sebaceous cyst of the chest wall. 4. Menorrhagia secondary to the Implanon insert no longer releasing     enough hormone. 5. Benign essential  hypertension. 6. Congestive heart failure. 7. Nonischemic cardiomyopathy. 8. Automatic defibrillator placement status. 9. Asthma. 10.Gastroesophageal reflux. 11.Iron-deficiency anemia. 12.Peripheral neuropathy. 13.Obstructive sleep apnea.     Mila Homer. [name], M.D.     SDK/MEDQ  D:  06/28/2012  T:  06/28/2012  Job:  [id number]

## 2012-06-28 NOTE — Progress Notes (Signed)
Patient received discharge instructions along with follow up prescriptions. Patient verbalized understanding of all instructions. Patient received diabetic teaching before discharge. Patient was escorted by staff via wheelchair to vehicle. Patient discharged to home in stable condition.

## 2012-09-16 ENCOUNTER — Ambulatory Visit (INDEPENDENT_AMBULATORY_CARE_PROVIDER_SITE_OTHER): Payer: Medicare Other | Admitting: Internal Medicine

## 2012-09-16 ENCOUNTER — Encounter: Payer: Self-pay | Admitting: Internal Medicine

## 2012-09-16 VITALS — BP 126/82 | HR 85 | Ht 66.0 in | Wt 281.0 lb

## 2012-09-16 DIAGNOSIS — I5022 Chronic systolic (congestive) heart failure: Secondary | ICD-10-CM

## 2012-09-16 DIAGNOSIS — Z95810 Presence of automatic (implantable) cardiac defibrillator: Secondary | ICD-10-CM

## 2012-09-16 LAB — ICD DEVICE OBSERVATION
AL AMPLITUDE: 1.1 mv
AL THRESHOLD: 0.7 V
RV LEAD AMPLITUDE: 23.9 mv
RV LEAD IMPEDENCE ICD: 538 Ohm
RV LEAD THRESHOLD: 0.6 V
TZON-0003FASTVT: 300 ms

## 2012-09-16 NOTE — Assessment & Plan Note (Signed)
Her [employer] BiV ICD is working normally. Will recheck in several months.

## 2012-09-16 NOTE — Patient Instructions (Addendum)
Your physician recommends that you schedule a follow-up appointment in: 1 YEAR WITH DR [name]  TAKE AN EXTRA FLUID PILL WHEN YOU GET HOME

## 2012-09-16 NOTE — Assessment & Plan Note (Signed)
She admits to dietary and medical compliance. I have asked the patient to take an extra lasix today when she gets home.

## 2012-09-16 NOTE — Progress Notes (Signed)
HPI [name] returns today for followup. She is a pleasant 47 yo woman with morbid obesity,non-ischemic CM, chronic systolic CHF, s/p BiV ICD, HTN, and recent diagnosis of DM. IN the interim she notes dietary indiscretion over the holidays and has gained weight. No ICD shock.  No Known Allergies   Current Outpatient Prescriptions  Medication Sig Dispense Refill  . albuterol (PROVENTIL) (2.5 MG/3ML) 0.083% nebulizer solution Take 3 mLs (2.5 mg total) by nebulization every 4 (four) hours as needed for wheezing.  75 mL  0  . albuterol (VENTOLIN HFA) 108 (90 BASE) MCG/ACT inhaler Inhale 2 puffs into the lungs every 6 (six) hours as needed. For shortness of breath      . benazepril (LOTENSIN) 40 MG tablet Take 40 mg by mouth daily.       . carvedilol (COREG) 25 MG tablet Take 25 mg by mouth 2 (two) times daily.      . cetirizine (ZYRTEC) 10 MG tablet Take 10 mg by mouth daily as needed. For allergies      . cyclobenzaprine (FLEXERIL) 10 MG tablet Take 10 mg by mouth 3 (three) times daily as needed. For muscle spasms      . ferrous sulfate 325 (65 FE) MG tablet Take 325 mg by mouth 3 (three) times daily.      Marland Kitchen gabapentin (NEURONTIN) 300 MG capsule Take 300-600 mg by mouth 2 (two) times daily. 1 capsule in am and 2 in pm      . hydrALAZINE (APRESOLINE) 100 MG tablet Take 100 mg by mouth 3 (three) times daily. For heart failure      . insulin aspart (NOVOLOG) 100 UNIT/ML injection Inject 0-20 Units into the skin 3 (three) times daily with meals.  1 vial  5  . insulin glargine (LANTUS) 100 UNIT/ML injection Inject 80 Units into the skin at bedtime. Increase the dose by ten units every 2 days based on your fasting sugar.  Continue to increase the dose as long as your fasting sugar is above 100.  10 pen  11  . isosorbide mononitrate (IMDUR) 60 MG 24 hr tablet Take 60 mg by mouth 3 (three) times daily. For heart failure      . omeprazole (PRILOSEC) 40 MG capsule Take 40 mg by mouth daily.       Marland Kitchen  oxyCODONE-acetaminophen (PERCOCET/ROXICET) 5-325 MG per tablet Take 1 tablet by mouth every 4 (four) hours as needed for pain.  60 tablet  0  . potassium chloride SA (K-DUR,KLOR-CON) 20 MEQ tablet Take 20 mEq by mouth 5 (five) times daily.       Marland Kitchen torsemide (DEMADEX) 20 MG tablet Take 40 mg by mouth daily. For heart failure      . Etonogestrel (IMPLANON [place]) Inject 1 each into the skin once. Gets every 3 years         Past Medical History  Diagnosis Date  . CHF (congestive heart failure)     chronic systolic CHF,  . Nonischemic cardiomyopathy     EF 25%  . Mitral regurgitation     moderate to severe  . S/P cardiac catheterization 10/06    normal coronary arteries  . LBBB (left bundle branch block)   . HTN (hypertension)   . Asthma   . IBS (irritable bowel syndrome)     with primarily constipation  . Morbid obesity   . GERD (gastroesophageal reflux disease)   . IDA (iron deficiency anemia)     2o TO SB AVMS, Hx  parenteral iron Dr Mariel Sleet  . AVM (arteriovenous malformation)     Small bowel, s/p duble balloon enteroscopy/APC jejunum Dr Gwinda [facility]se [facility]) 01/23/2011  . Anemia, chronic disease   . Peripheral neuropathy   . OSA (obstructive sleep apnea)     on CPAP qhs  . GI bleed 06/2009    Hgb 7.5, ferritin 7, transfusion, iron IV  . Dyspnea on exertion     chronic    ROS:   All systems reviewed and negative except as noted in the HPI.   Past Surgical History  Procedure Date  . Crdt-implantation 4/07    [employer]. remote- yes  . Appendectomy   . Cholecystectomy     biliary dyskinesia  . Mastectomy 2003    left, partial  . Knee arthroscopy 2005    left  . Small bowel enteroscopy MAY 2012 [facility] DR. GILLIAM    SB AVMS s/p APC  . Colonoscopy OCT 2010/SEP 2011    tortuous colon, 3 polyps-benign(2010),   . Upper gastrointestinal endoscopy '08, '10, SEP 2011    mild antral gastritis (2010), negative SB bx (2010), incomlpete Schatzki's ring (2011)  . Small  bowel enteroscopy SEP 2011 [facility]    Fields-NO AVMS  . Givens capsule study NOV 2010     NO AVMS, normal  . Cardiac defibrillator placement   . Breast surgery   . Irrigation and debridement abscess 06/25/2012    Procedure: MINOR INCISION AND DRAINAGE OF ABSCESS;  Surgeon: [name], MD;  Location: [facility] ORS;  Service: General;  Laterality: [name];  Incision & Drainage of Infected Sebaceous Cyst on Chest     Family History  Problem Relation Age of Onset  . Coronary artery disease      premature CAD- no family Hx   . Colon cancer Neg Hx     no family Hx of polyps too  . Cervical cancer Mother      History   Social History  . Marital Status: Single    Spouse Name: [name]    Number of Children: 1  . Years of Education: [name]   Occupational History  . disabled    Social History Main Topics  . Smoking status: Former Smoker -- 0.5 packs/day for 20 years    Types: Cigarettes  . Smokeless tobacco: Not on file  . Alcohol Use: No  . Drug Use: No  . Sexually Active: Yes    Birth Control/ Protection: Implant   Other Topics Concern  . 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Single- 1 daughter age 65. Does not get regular exercise.       BP 126/82  Pulse 85  Ht 5\' 6"  (1.676 m)  Wt 281 lb (127.461 kg)  BMI 45.35 kg/m2  Physical Exam:  Well appearing obese, middle aged woman, NAD HEENT: Unremarkable Neck:  7cm JVD, no thyromegally Lungs:  Clear with no wheezes, rales, or rhonchi HEART:  Regular rate rhythm, no murmurs, no rubs, no clicks Abd:  soft, positive bowel sounds, no organomegally, no rebound, no guarding Ext:  2 plus pulses, no edema, no cyanosis, no clubbing Skin:  No rashes no nodules Neuro:  CN II through XII intact, motor grossly intact  DEVICE  Normal device function.  See PaceArt for details.   Assess/Plan:

## 2012-09-23 ENCOUNTER — Encounter: Payer: Self-pay | Admitting: Internal Medicine

## 2012-11-19 ENCOUNTER — Encounter (HOSPITAL_COMMUNITY): Payer: Self-pay | Admitting: Emergency Medicine

## 2012-11-19 ENCOUNTER — Inpatient Hospital Stay (HOSPITAL_COMMUNITY)
Admission: EM | Admit: 2012-11-19 | Discharge: 2012-11-21 | DRG: 292 | Disposition: A | Discharge status: Home / Self Care | Payer: Medicare Other | Attending: Family Medicine | Admitting: Family Medicine

## 2012-11-19 ENCOUNTER — Emergency Department (HOSPITAL_COMMUNITY): Payer: Medicare Other

## 2012-11-19 DIAGNOSIS — I059 Rheumatic mitral valve disease, unspecified: Secondary | ICD-10-CM | POA: Diagnosis present

## 2012-11-19 DIAGNOSIS — K047 Periapical abscess without sinus: Secondary | ICD-10-CM | POA: Diagnosis present

## 2012-11-19 DIAGNOSIS — I509 Heart failure, unspecified: Secondary | ICD-10-CM

## 2012-11-19 DIAGNOSIS — Z6841 Body Mass Index (BMI) 40.0 and over, adult: Secondary | ICD-10-CM

## 2012-11-19 DIAGNOSIS — D638 Anemia in other chronic diseases classified elsewhere: Secondary | ICD-10-CM | POA: Diagnosis present

## 2012-11-19 DIAGNOSIS — G4733 Obstructive sleep apnea (adult) (pediatric): Secondary | ICD-10-CM | POA: Diagnosis present

## 2012-11-19 DIAGNOSIS — Z95810 Presence of automatic (implantable) cardiac defibrillator: Secondary | ICD-10-CM

## 2012-11-19 DIAGNOSIS — N760 Acute vaginitis: Secondary | ICD-10-CM | POA: Diagnosis present

## 2012-11-19 DIAGNOSIS — I10 Essential (primary) hypertension: Secondary | ICD-10-CM | POA: Diagnosis present

## 2012-11-19 DIAGNOSIS — Z87891 Personal history of nicotine dependence: Secondary | ICD-10-CM

## 2012-11-19 DIAGNOSIS — Z8249 Family history of ischemic heart disease and other diseases of the circulatory system: Secondary | ICD-10-CM

## 2012-11-19 DIAGNOSIS — R609 Edema, unspecified: Secondary | ICD-10-CM | POA: Diagnosis present

## 2012-11-19 DIAGNOSIS — D509 Iron deficiency anemia, unspecified: Secondary | ICD-10-CM | POA: Diagnosis present

## 2012-11-19 DIAGNOSIS — Z794 Long term (current) use of insulin: Secondary | ICD-10-CM

## 2012-11-19 DIAGNOSIS — I5023 Acute on chronic systolic (congestive) heart failure: Principal | ICD-10-CM | POA: Diagnosis present

## 2012-11-19 DIAGNOSIS — I428 Other cardiomyopathies: Secondary | ICD-10-CM | POA: Diagnosis present

## 2012-11-19 DIAGNOSIS — Z79899 Other long term (current) drug therapy: Secondary | ICD-10-CM

## 2012-11-19 DIAGNOSIS — K589 Irritable bowel syndrome without diarrhea: Secondary | ICD-10-CM | POA: Diagnosis present

## 2012-11-19 DIAGNOSIS — I5022 Chronic systolic (congestive) heart failure: Secondary | ICD-10-CM

## 2012-11-19 DIAGNOSIS — J45909 Unspecified asthma, uncomplicated: Secondary | ICD-10-CM | POA: Diagnosis present

## 2012-11-19 DIAGNOSIS — K219 Gastro-esophageal reflux disease without esophagitis: Secondary | ICD-10-CM | POA: Diagnosis present

## 2012-11-19 DIAGNOSIS — E119 Type 2 diabetes mellitus without complications: Secondary | ICD-10-CM | POA: Diagnosis present

## 2012-11-19 DIAGNOSIS — I447 Left bundle-branch block, unspecified: Secondary | ICD-10-CM | POA: Diagnosis present

## 2012-11-19 LAB — GLUCOSE, CAPILLARY
Glucose-Capillary: 326 mg/dL — ABNORMAL HIGH (ref 70–99)
Glucose-Capillary: 311 mg/dL — ABNORMAL HIGH (ref 70–99)

## 2012-11-19 LAB — CBC WITH DIFFERENTIAL/PLATELET
Basophils Relative: 0 % (ref 0–1)
Eosinophils Absolute: 0.1 10*3/uL (ref 0.0–0.7)
HCT: 37.5 % (ref 36.0–46.0)
Hemoglobin: 11.9 g/dL — ABNORMAL LOW (ref 12.0–15.0)
MCH: 27.4 pg (ref 26.0–34.0)
MCHC: 31.7 g/dL (ref 30.0–36.0)
MCV: 86.2 fL (ref 78.0–100.0)
Monocytes Absolute: 0.3 10*3/uL (ref 0.1–1.0)
Monocytes Relative: 4 % (ref 3–12)

## 2012-11-19 LAB — BASIC METABOLIC PANEL
BUN: 9 mg/dL (ref 6–23)
CO2: 33 mEq/L — ABNORMAL HIGH (ref 19–32)
Chloride: 94 mEq/L — ABNORMAL LOW (ref 96–112)
Glucose, Bld: 199 mg/dL — ABNORMAL HIGH (ref 70–99)
Potassium: 3.4 mEq/L — ABNORMAL LOW (ref 3.5–5.1)

## 2012-11-19 MED ORDER — CYCLOBENZAPRINE HCL 10 MG PO TABS: 10.0000 mg | ORAL_TABLET | Freq: Three times a day (TID) | ORAL | Status: DC | PRN

## 2012-11-19 MED ORDER — ACETAMINOPHEN 325 MG PO TABS
650.0000 mg | ORAL_TABLET | Freq: Once | ORAL | Status: AC
  Administered 2012-11-19: 650 mg via ORAL
  Filled 2012-11-19: qty 2

## 2012-11-19 MED ORDER — ALBUTEROL SULFATE (5 MG/ML) 0.5% IN NEBU
2.5000 mg | INHALATION_SOLUTION | RESPIRATORY_TRACT | Status: DC | PRN
  Administered 2012-11-20: 2.5 mg via RESPIRATORY_TRACT
  Filled 2012-11-19: qty 0.5

## 2012-11-19 MED ORDER — NITROGLYCERIN 2 % TD OINT
0.5000 [in_us] | TOPICAL_OINTMENT | Freq: Once | TRANSDERMAL | Status: AC
  Administered 2012-11-19: 0.5 [in_us] via TOPICAL
  Filled 2012-11-19: qty 1

## 2012-11-19 MED ORDER — MAGNESIUM OXIDE 400 (241.3 MG) MG PO TABS
400.0000 mg | ORAL_TABLET | Freq: Every day | ORAL | Status: DC
  Administered 2012-11-19 – 2012-11-20 (×2): 400 mg via ORAL
  Filled 2012-11-19 (×4): qty 1

## 2012-11-19 MED ORDER — BENAZEPRIL HCL 10 MG PO TABS
40.0000 mg | ORAL_TABLET | Freq: Every day | ORAL | Status: DC
  Administered 2012-11-20: 40 mg via ORAL
  Filled 2012-11-19: qty 4

## 2012-11-19 MED ORDER — FLUCONAZOLE 100 MG PO TABS
100.0000 mg | ORAL_TABLET | Freq: Every day | ORAL | Status: DC
  Administered 2012-11-19 – 2012-11-20 (×2): 100 mg via ORAL
  Filled 2012-11-19 (×2): qty 1

## 2012-11-19 MED ORDER — INSULIN ASPART 100 UNIT/ML ~~LOC~~ SOLN: 0.0000 [IU] | Freq: Three times a day (TID) | SUBCUTANEOUS | Status: DC

## 2012-11-19 MED ORDER — ALBUTEROL SULFATE (5 MG/ML) 0.5% IN NEBU
10.0000 mg | INHALATION_SOLUTION | Freq: Once | RESPIRATORY_TRACT | Status: AC
  Administered 2012-11-19: 10 mg via RESPIRATORY_TRACT
  Filled 2012-11-19: qty 2

## 2012-11-19 MED ORDER — CARVEDILOL 12.5 MG PO TABS
25.0000 mg | ORAL_TABLET | Freq: Two times a day (BID) | ORAL | Status: DC
  Administered 2012-11-19 – 2012-11-20 (×3): 25 mg via ORAL
  Filled 2012-11-19: qty 1
  Filled 2012-11-19 (×2): qty 2

## 2012-11-19 MED ORDER — ONDANSETRON 8 MG PO TBDP
8.0000 mg | ORAL_TABLET | Freq: Once | ORAL | Status: AC
  Administered 2012-11-19: 8 mg via ORAL
  Filled 2012-11-19: qty 1

## 2012-11-19 MED ORDER — SODIUM CHLORIDE 0.9 % IV SOLN
INTRAVENOUS | Status: AC
  Filled 2012-11-19 (×3): qty 1.5

## 2012-11-19 MED ORDER — PANTOPRAZOLE SODIUM 40 MG PO TBEC
40.0000 mg | DELAYED_RELEASE_TABLET | Freq: Every day | ORAL | Status: DC
  Administered 2012-11-19 – 2012-11-20 (×2): 40 mg via ORAL
  Filled 2012-11-19 (×2): qty 1

## 2012-11-19 MED ORDER — GABAPENTIN 300 MG PO CAPS
600.0000 mg | ORAL_CAPSULE | Freq: Every day | ORAL | Status: DC
  Administered 2012-11-19 – 2012-11-20 (×2): 600 mg via ORAL
  Filled 2012-11-19 (×2): qty 2

## 2012-11-19 MED ORDER — HYDROCODONE-ACETAMINOPHEN 10-325 MG PO TABS
1.0000 | ORAL_TABLET | Freq: Four times a day (QID) | ORAL | Status: DC | PRN
  Administered 2012-11-20 (×2): 1 via ORAL
  Filled 2012-11-19 (×2): qty 1

## 2012-11-19 MED ORDER — ONDANSETRON HCL 4 MG/2ML IJ SOLN
INTRAMUSCULAR | Status: AC
  Administered 2012-11-19: 4 mg via INTRAVENOUS
  Filled 2012-11-19: qty 2

## 2012-11-19 MED ORDER — POTASSIUM CHLORIDE CRYS ER 20 MEQ PO TBCR
40.0000 meq | EXTENDED_RELEASE_TABLET | Freq: Once | ORAL | Status: AC
  Administered 2012-11-19: 40 meq via ORAL
  Filled 2012-11-19: qty 2

## 2012-11-19 MED ORDER — SODIUM CHLORIDE 0.9 % IV SOLN
INTRAVENOUS | Status: DC
  Administered 2012-11-19: 16:00:00 via INTRAVENOUS

## 2012-11-19 MED ORDER — ENOXAPARIN SODIUM 40 MG/0.4ML ~~LOC~~ SOLN
40.0000 mg | SUBCUTANEOUS | Status: DC
  Administered 2012-11-19 – 2012-11-20 (×2): 40 mg via SUBCUTANEOUS
  Filled 2012-11-19 (×2): qty 0.4

## 2012-11-19 MED ORDER — FUROSEMIDE 40 MG PO TABS
80.0000 mg | ORAL_TABLET | Freq: Once | ORAL | Status: AC
  Administered 2012-11-19: 80 mg via ORAL
  Filled 2012-11-19: qty 2

## 2012-11-19 MED ORDER — ONDANSETRON HCL 4 MG/2ML IJ SOLN: 4.0000 mg | Freq: Once | INTRAMUSCULAR | Status: AC

## 2012-11-19 MED ORDER — FERROUS SULFATE 325 (65 FE) MG PO TABS
325.0000 mg | ORAL_TABLET | Freq: Three times a day (TID) | ORAL | Status: DC
  Administered 2012-11-19 – 2012-11-20 (×5): 325 mg via ORAL
  Filled 2012-11-19 (×6): qty 1

## 2012-11-19 MED ORDER — GABAPENTIN 300 MG PO CAPS
600.0000 mg | ORAL_CAPSULE | ORAL | Status: DC
  Administered 2012-11-20 – 2012-11-21 (×2): 600 mg via ORAL
  Filled 2012-11-19 (×2): qty 2

## 2012-11-19 MED ORDER — ALBUTEROL SULFATE (5 MG/ML) 0.5% IN NEBU
5.0000 mg | INHALATION_SOLUTION | Freq: Once | RESPIRATORY_TRACT | Status: AC
  Administered 2012-11-19: 5 mg via RESPIRATORY_TRACT
  Filled 2012-11-19: qty 1

## 2012-11-19 MED ORDER — ISOSORBIDE MONONITRATE ER 60 MG PO TB24
60.0000 mg | ORAL_TABLET | Freq: Three times a day (TID) | ORAL | Status: DC
  Administered 2012-11-19 – 2012-11-20 (×3): 60 mg via ORAL
  Filled 2012-11-19 (×5): qty 1

## 2012-11-19 MED ORDER — INSULIN DETEMIR 100 UNIT/ML ~~LOC~~ SOLN
SUBCUTANEOUS | Status: AC
  Filled 2012-11-19: qty 10

## 2012-11-19 MED ORDER — GABAPENTIN 300 MG PO CAPS
300.0000 mg | ORAL_CAPSULE | Freq: Every day | ORAL | Status: DC
  Administered 2012-11-20: 300 mg via ORAL
  Filled 2012-11-19: qty 1

## 2012-11-19 MED ORDER — FUROSEMIDE 10 MG/ML IJ SOLN
40.0000 mg | Freq: Two times a day (BID) | INTRAMUSCULAR | Status: DC
  Administered 2012-11-19 – 2012-11-21 (×4): 40 mg via INTRAVENOUS
  Filled 2012-11-19 (×4): qty 4

## 2012-11-19 MED ORDER — PREDNISONE 10 MG PO TABS
60.0000 mg | ORAL_TABLET | Freq: Once | ORAL | Status: AC
  Administered 2012-11-19: 60 mg via ORAL
  Filled 2012-11-19: qty 1

## 2012-11-19 MED ORDER — INSULIN DETEMIR 100 UNIT/ML ~~LOC~~ SOLN
80.0000 [IU] | Freq: Every day | SUBCUTANEOUS | Status: DC
  Administered 2012-11-19 – 2012-11-20 (×2): 80 [IU] via SUBCUTANEOUS
  Filled 2012-11-19: qty 10

## 2012-11-19 MED ORDER — POTASSIUM CHLORIDE CRYS ER 20 MEQ PO TBCR
20.0000 meq | EXTENDED_RELEASE_TABLET | Freq: Every day | ORAL | Status: DC
  Administered 2012-11-19 – 2012-11-21 (×8): 20 meq via ORAL
  Filled 2012-11-19 (×8): qty 1

## 2012-11-19 MED ORDER — INSULIN ASPART 100 UNIT/ML ~~LOC~~ SOLN
0.0000 [IU] | Freq: Three times a day (TID) | SUBCUTANEOUS | Status: DC
  Administered 2012-11-19: 7 [IU] via SUBCUTANEOUS
  Administered 2012-11-20 (×2): 1 [IU] via SUBCUTANEOUS
  Administered 2012-11-20: 2 [IU] via SUBCUTANEOUS
  Administered 2012-11-21: 1 [IU] via SUBCUTANEOUS

## 2012-11-19 MED ORDER — IPRATROPIUM BROMIDE 0.02 % IN SOLN
0.5000 mg | Freq: Once | RESPIRATORY_TRACT | Status: AC
  Administered 2012-11-19: 0.5 mg via RESPIRATORY_TRACT
  Filled 2012-11-19: qty 2.5

## 2012-11-19 MED ORDER — HYDRALAZINE HCL 25 MG PO TABS
100.0000 mg | ORAL_TABLET | Freq: Three times a day (TID) | ORAL | Status: DC
  Administered 2012-11-19 – 2012-11-20 (×4): 100 mg via ORAL
  Filled 2012-11-19: qty 3
  Filled 2012-11-19 (×9): qty 4
  Filled 2012-11-19: qty 1

## 2012-11-19 MED ORDER — SODIUM CHLORIDE 0.9 % IV SOLN
1.5000 g | Freq: Four times a day (QID) | INTRAVENOUS | Status: DC
  Administered 2012-11-19 – 2012-11-21 (×7): 1.5 g via INTRAVENOUS
  Filled 2012-11-19 (×8): qty 1.5

## 2012-11-19 NOTE — ED Notes (Signed)
x4 unsuccessful IV attempts made x2 by Tarri Glenn RN and x2 by Loraine Grip RN. EDP made aware. No IV needed at this time per Dr Bebe Shaggy.

## 2012-11-19 NOTE — ED Provider Notes (Signed)
History  This chart was scribed for [name], MD by Bennett Scrape, ED Scribe. This patient was seen in room APA14/APA14 and the patient's care was started at 9:01 AM.  CSN: [id number]  Arrival date & time 11/19/12  5784   First MD Initiated Contact with Patient 11/19/12 0901      Chief Complaint  Patient presents with  . Shortness of Breath     Patient is a 47 y.o. female presenting with shortness of breath. The history is provided by the patient. No language interpreter was used.  Shortness of Breath Severity:  Moderate Onset quality:  Gradual Duration:  3 days Timing:  Constant Progression:  Worsening Chronicity:  New Relieved by:  Nothing Worsened by:  Exertion Ineffective treatments:  Inhaler Associated symptoms: abdominal pain and cough   Associated symptoms: no chest pain and no vomiting     [name] is a 47 y.o. female who presents to the Emergency Department complaining of 3 days of gradual onset, gradually worsening, constant SOB with associated cough productive of phlegm. She reports that she has been using her inhaler and doing nebulizer treatments every 4 hours without improvement. She states that she feels similar to when she needed a blood transfusion due to a GI bleed in 2010.  She reports abdominal pain due to recent antibiotic and pain medication use for dental pain but denies changes. She denies any melena, hematochezia, and CP as associated symptoms. She has a defibrillator for cardiomyopathy and she denies any recent firings. She denies being on anticoagulants currently. She has a h/o HTN and GERD. She is a former smoker but denies alcohol use.   Past Medical History  Diagnosis Date  . CHF (congestive heart failure)     chronic systolic CHF,  . Nonischemic cardiomyopathy     EF 25%  . Mitral regurgitation     moderate to severe  . S/P cardiac catheterization 10/06    normal coronary arteries  . LBBB (left bundle branch block)   . HTN  (hypertension)   . Asthma   . IBS (irritable bowel syndrome)     with primarily constipation  . Morbid obesity   . GERD (gastroesophageal reflux disease)   . IDA (iron deficiency anemia)     2o TO SB AVMS, Hx parenteral iron Dr Mariel Sleet  . AVM (arteriovenous malformation)     Small bowel, s/p duble balloon enteroscopy/APC jejunum Dr Gwinda Passe [facility]) 01/23/2011  . Anemia, chronic disease   . Peripheral neuropathy   . OSA (obstructive sleep apnea)     on CPAP qhs  . GI bleed 06/2009    Hgb 7.5, ferritin 7, transfusion, iron IV  . Dyspnea on exertion     chronic    Past Surgical History  Procedure Laterality Date  . Crdt-implantation  4/07    [employer]. remote- yes  . Appendectomy    . Cholecystectomy      biliary dyskinesia  . Mastectomy  2003    left, partial  . Knee arthroscopy  2005    left  . Small bowel enteroscopy  MAY 2012 [facility] DR. GILLIAM    SB AVMS s/p APC  . Colonoscopy  OCT 2010/SEP 2011    tortuous colon, 3 polyps-benign(2010),   . Upper gastrointestinal endoscopy  '08, '10, SEP 2011    mild antral gastritis (2010), negative SB bx (2010), incomlpete Schatzki's ring (2011)  . Small bowel enteroscopy  SEP 2011 [facility]    Fields-NO AVMS  .  Givens capsule study  NOV 2010     NO AVMS, normal  . Cardiac defibrillator placement    . Breast surgery    . Irrigation and debridement abscess  06/25/2012    Procedure: MINOR INCISION AND DRAINAGE OF ABSCESS;  Surgeon: [name], MD;  Location: [facility];  Service: General;  Laterality: [name];  Incision & Drainage of Infected Sebaceous Cyst on Chest    Family History  Problem Relation Age of Onset  . Coronary artery disease      premature CAD- no family Hx   . Colon cancer Neg Hx     no family Hx of polyps too  . Cervical cancer Mother     History  Substance Use Topics  . Smoking status: Former Smoker -- 0.50 packs/day for 20 years    Types: Cigarettes  . Smokeless tobacco: Not on file  .  Alcohol Use: No    No OB history provided.  Review of Systems  Respiratory: Positive for cough and shortness of breath.   Cardiovascular: Negative for chest pain.  Gastrointestinal: Positive for nausea and abdominal pain. Negative for vomiting and blood in stool.  Genitourinary: Negative for vaginal bleeding.  All other systems reviewed and are negative.    Allergies  Review of patient's allergies indicates no known allergies.  Home Medications   Current Outpatient Rx  Name  Route  Sig  Dispense  Refill  . albuterol (PROVENTIL) (2.5 MG/3ML) 0.083% nebulizer solution   Nebulization   Take 3 mLs (2.5 mg total) by nebulization every 4 (four) hours as needed for wheezing.   75 mL   0   . albuterol (VENTOLIN HFA) 108 (90 BASE) MCG/ACT inhaler   Inhalation   Inhale 2 puffs into the lungs every 6 (six) hours as needed. For shortness of breath         . benazepril (LOTENSIN) 40 MG tablet   Oral   Take 40 mg by mouth daily.          . carvedilol (COREG) 25 MG tablet   Oral   Take 25 mg by mouth 2 (two) times daily.         . cetirizine (ZYRTEC) 10 MG tablet   Oral   Take 10 mg by mouth daily as needed. For allergies         . cyclobenzaprine (FLEXERIL) 10 MG tablet   Oral   Take 10 mg by mouth 3 (three) times daily as needed. For muscle spasms         . Etonogestrel (IMPLANON [place])   Subcutaneous   Inject 1 each into the skin once. Gets every 3 years         . ferrous sulfate 325 (65 FE) MG tablet   Oral   Take 325 mg by mouth 3 (three) times daily.         Marland Kitchen gabapentin (NEURONTIN) 300 MG capsule   Oral   Take 300-600 mg by mouth 2 (two) times daily. 1 capsule in am and 2 in pm         . hydrALAZINE (APRESOLINE) 100 MG tablet   Oral   Take 100 mg by mouth 3 (three) times daily. For heart failure         . insulin aspart (NOVOLOG) 100 UNIT/ML injection   Subcutaneous   Inject 0-20 Units into the skin 3 (three) times daily with meals.   1 vial    5   . insulin glargine (LANTUS)  100 UNIT/ML injection   Subcutaneous   Inject 80 Units into the skin at bedtime. Increase the dose by ten units every 2 days based on your fasting sugar.  Continue to increase the dose as long as your fasting sugar is above 100.   10 pen   11     Dispense as written.   . isosorbide mononitrate (IMDUR) 60 MG 24 hr tablet   Oral   Take 60 mg by mouth 3 (three) times daily. For heart failure         . omeprazole (PRILOSEC) 40 MG capsule   Oral   Take 40 mg by mouth daily.          Marland Kitchen oxyCODONE-acetaminophen (PERCOCET/ROXICET) 5-325 MG per tablet   Oral   Take 1 tablet by mouth every 4 (four) hours as needed for pain.   60 tablet   0   . potassium chloride SA (K-DUR,KLOR-CON) 20 MEQ tablet   Oral   Take 20 mEq by mouth 5 (five) times daily.          Marland Kitchen torsemide (DEMADEX) 20 MG tablet   Oral   Take 40 mg by mouth daily. For heart failure           Triage Vitals: BP 131/97  Pulse 94  Temp(Src) 98.5 F (36.9 C) (Oral)  Resp 23  SpO2 92% BP 129/85  Pulse 87  Temp(Src) 98.5 F (36.9 C) (Oral)  Resp 18  SpO2 90%  Physical Exam  Nursing note and vitals reviewed.  CONSTITUTIONAL: Well developed/well nourished HEAD: Normocephalic/atraumatic EYES: EOMI/PERRL ENMT: Mucous membranes moist NECK: supple no meningeal signs SPINE:entire spine nontender CV: S1/S2 noted, no murmurs/rubs/gallops noted LUNGS: decreased breath sounds bilaterally, mild tachypnea noted ABDOMEN: soft, nontender, no rebound or guarding GU:no cva tenderness NEURO: Pt is awake/alert, moves all extremitiesx4 EXTREMITIES: pulses normal, full ROM SKIN: warm, color normal PSYCH: no abnormalities of mood noted  ED Course  Procedures  DIAGNOSTIC STUDIES: Oxygen Saturation is 92% on RA, low by my interpretation.    COORDINATION OF CARE: 9:11 AM-Discussed treatment plan which includes CXR, CBC panel, UA and breathing treatment with pt at bedside and pt agreed to  plan.  Pt with h/o nonischemic cardiomyopathy.  Suspect she may have CHF.  Will follow closely  9:15 AM- Ordered 5 mg of 0.5% albuterol nebulizer solution and 0.5 mg of Atrovent solution.  9:45 AM- Ordered 80 mg Lasix tablet and 2% nitroglycerin ointment  10:02 AM- Pt rechecked and feels mildly improved with breathing treatment. Discussed another breathing treatment and lasix which pt agreed to.  10:15 AM- Ordered 60 mg prednisone tablet, 10 mg of 0.5% albuterol nebulizer solution and 40 mEq of ptassium chloride tablet.   Pt with minimal improvement, reports continued dyspnea on exertion and near syncope Will admit D/w dr [name], will admit   MDM  Nursing notes including past medical history and social history reviewed and considered in documentation xrays reviewed and considered Labs/vital reviewed and considered Previous records reviewed and considered - h/o nonischemic cardiomyopathy       Date: 11/19/2012  Rate: 96  Rhythm: indeterminate  QRS Axis: right  Intervals: normal  ST/T Wave abnormalities: nonspecific ST changes  Conduction Disutrbances:ventricular paced rhythm  Narrative Interpretation:   Old EKG Reviewed: unchanged    I personally performed the services described in this documentation, which was scribed in my presence. The recorded information has been reviewed and is accurate.         Dorinda Hill  Forestine Chute, MD 11/19/12 1353

## 2012-11-19 NOTE — ED Notes (Signed)
Respiratory Therapist paged for breathing treatment.

## 2012-11-19 NOTE — ED Notes (Signed)
Patient still dry heaving into emesis bag. Dr Bebe Shaggy made aware.

## 2012-11-19 NOTE — ED Notes (Signed)
Bedside commode in room per patient's request.

## 2012-11-19 NOTE — ED Notes (Signed)
Patient ambulated to bathroom, tolerated well until returning to room. Patient felt lightheaded and short of breath. Patient taken back to room in wheelchair. Dr Bebe Shaggy made aware.

## 2012-11-19 NOTE — ED Notes (Signed)
Pt c/o sob x 3 days. Also c/o some nausea. Pt states she has h/s of anemia.

## 2012-11-20 DIAGNOSIS — I509 Heart failure, unspecified: Secondary | ICD-10-CM

## 2012-11-20 LAB — GLUCOSE, CAPILLARY
Glucose-Capillary: 144 mg/dL — ABNORMAL HIGH (ref 70–99)
Glucose-Capillary: 141 mg/dL — ABNORMAL HIGH (ref 70–99)
Glucose-Capillary: 121 mg/dL — ABNORMAL HIGH (ref 70–99)

## 2012-11-20 LAB — BASIC METABOLIC PANEL
BUN: 13 mg/dL (ref 6–23)
CO2: 32 mEq/L (ref 19–32)
Chloride: 97 mEq/L (ref 96–112)
Glucose, Bld: 158 mg/dL — ABNORMAL HIGH (ref 70–99)
Potassium: 3.6 mEq/L (ref 3.5–5.1)
Sodium: 137 mEq/L (ref 135–145)

## 2012-11-20 MED ORDER — ALBUTEROL SULFATE (5 MG/ML) 0.5% IN NEBU
2.5000 mg | INHALATION_SOLUTION | Freq: Three times a day (TID) | RESPIRATORY_TRACT | Status: DC
  Administered 2012-11-20 – 2012-11-21 (×4): 2.5 mg via RESPIRATORY_TRACT
  Filled 2012-11-20 (×4): qty 0.5

## 2012-11-20 NOTE — Progress Notes (Signed)
[name], [name]               ACCOUNT NO.:  [id number]  MEDICAL RECORD NO.:  [id number]  LOCATION:  A325                          FACILITY:  [facility]  PHYSICIAN:  Mila Homer. [name], M.D.DATE OF BIRTH:  [birth year]  DATE OF PROCEDURE:  11/20/2012 DATE OF DISCHARGE:                                PROGRESS NOTE   SUBJECTIVE:  She is very tired today, does not feel well.  Now has a headache.  Unfortunately, she did not use CPAP last night, which she usually uses at home.  OBJECTIVE:  She is sitting up on the bed, feet on the floor.  Her significant other is with her.  She is in mild distress.  Her speech is normal and her sensorium is intact.  Her affect is good.  The lungs are clear throughout and the heart has a regular rhythm and rate of about 80.  VITAL SIGNS: Today show a temperature of 98.1, pulse 85, respiratory rate is 22, blood pressure low at 91/57.  Recheck blood test today showed a glucose of 158, but the BMP was normal.  ASSESSMENT: 1. Congestive heart failure. 2. Nonischemic cardiomyopathy. 3. Morbid obesity. 4. Obstructive sleep apnea.  PLAN:  Continue with her IV furosemide and CPAP.  Physical Therapy to evaluate.  I will follow Cardiology's recommendations on medication.     Mila Homer. [name], M.D.     SDK/MEDQ  D:  11/20/2012  T:  11/20/2012  Job:  [id number]

## 2012-11-20 NOTE — Progress Notes (Signed)
Patient wears a CPAP at night will need an order for machine tonight. Nurse informed.

## 2012-11-20 NOTE — Consult Note (Signed)
Reason for Consult:CHF Referring Physician: [name] is an 47 y.o. female.  HPI: this is a 47 year old female patient of Dr. Sharrell Ku and home all who has history of nonischemic cardiomyopathy ejection fraction 25%, BiV ICD, normal cardiac catheterization in 2006, mitral regurgitation which is moderate to severe, chronic left bundle branch block, and hypertension.  The patient presents to the emergency room with 3 day history of worsening shortness of breath associated with cough and a productive phlegm. She increased her inhaler and nebulizer treatments to every 4 hours without improvement. BNP was 509 on admission. CXR with mild interstitial edema.  Has had good response to iv diuretic.  Faithful with meds and her CPAP.  Tries to watch salt.  Needs to be at dentist office in am by 10:00  Past Medical History  Diagnosis Date  . CHF (congestive heart failure)     chronic systolic CHF,  . Nonischemic cardiomyopathy     EF 25%  . Mitral regurgitation     moderate to severe  . S/P cardiac catheterization 10/06    normal coronary arteries  . LBBB (left bundle branch block)   . HTN (hypertension)   . Asthma   . IBS (irritable bowel syndrome)     with primarily constipation  . Morbid obesity   . GERD (gastroesophageal reflux disease)   . IDA (iron deficiency anemia)     2o TO SB AVMS, Hx parenteral iron Dr Mariel Sleet  . AVM (arteriovenous malformation)     Small bowel, s/p duble balloon enteroscopy/APC jejunum Dr Gwinda Passe [facility]) 01/23/2011  . Anemia, chronic disease   . Peripheral neuropathy   . OSA (obstructive sleep apnea)     on CPAP qhs  . GI bleed 06/2009    Hgb 7.5, ferritin 7, transfusion, iron IV  . Dyspnea on exertion     chronic    Past Surgical History  Procedure Laterality Date  . Crdt-implantation  4/07    [employer]. remote- yes  . Appendectomy    . Cholecystectomy      biliary dyskinesia  . Mastectomy  2003    left, partial  . Knee  arthroscopy  2005    left  . Small bowel enteroscopy  MAY 2012 [facility] DR. GILLIAM    SB AVMS s/p APC  . Colonoscopy  OCT 2010/SEP 2011    tortuous colon, 3 polyps-benign(2010),   . Upper gastrointestinal endoscopy  '08, '10, SEP 2011    mild antral gastritis (2010), negative SB bx (2010), incomlpete Schatzki's ring (2011)  . Small bowel enteroscopy  SEP 2011 [facility]    Fields-NO AVMS  . Givens capsule study  NOV 2010     NO AVMS, normal  . Cardiac defibrillator placement    . Breast surgery    . Irrigation and debridement abscess  06/25/2012    Procedure: MINOR INCISION AND DRAINAGE OF ABSCESS;  Surgeon: [name], MD;  Location: [facility] ORS;  Service: General;  Laterality: [name];  Incision & Drainage of Infected Sebaceous Cyst on Chest    Family History  Problem Relation Age of Onset  . Coronary artery disease      premature CAD- no family Hx   . Colon cancer Neg Hx     no family Hx of polyps too  . Cervical cancer Mother   . Hypertension Mother   . Heart disease Father   . Hypertension Father   . GER disease Father     Social  History:  reports that she quit smoking about 3 years ago. Her smoking use included Cigarettes. She has a 10 pack-year smoking history. She does not have any smokeless tobacco history on file. She reports that she does not drink alcohol or use illicit drugs.  Allergies: No Known Allergies  Medications: Scheduled Meds: . albuterol  2.5 mg Nebulization TID  . ampicillin-sulbactam (UNASYN) IV  1.5 g Intravenous Q6H  . benazepril  40 mg Oral Daily  . carvedilol  25 mg Oral BID  . enoxaparin (LOVENOX) injection  40 mg Subcutaneous Q24H  . ferrous sulfate  325 mg Oral TID  . fluconazole  100 mg Oral Daily  . furosemide  40 mg Intravenous BID  . gabapentin  300 mg Oral Q1200  . gabapentin  600 mg Oral Q24H  . gabapentin  600 mg Oral QHS  . hydrALAZINE  100 mg Oral TID  . insulin aspart  0-9 Units Subcutaneous TID WC  . insulin detemir  80 Units  Subcutaneous QHS  . isosorbide mononitrate  60 mg Oral TID  . magnesium oxide  400 mg Oral Daily  . pantoprazole  40 mg Oral Daily  . potassium chloride SA  20 mEq Oral 5 X Daily   Continuous Infusions: . sodium chloride 20 mL/hr at 11/19/12 1549   PRN Meds:.albuterol, cyclobenzaprine, HYDROcodone-acetaminophen   Results for orders placed during the hospital encounter of 11/19/12 (from the past 48 hour(s))  BASIC METABOLIC PANEL     Status: Abnormal   Collection Time    11/19/12  9:11 AM      Result Value Range   Sodium 137  135 - 145 mEq/L   Potassium 3.4 (*) 3.5 - 5.1 mEq/L   Chloride 94 (*) 96 - 112 mEq/L   CO2 33 (*) 19 - 32 mEq/L   Glucose, Bld 199 (*) 70 - 99 mg/dL   BUN 9  6 - 23 mg/dL   Creatinine, Ser 1.61  0.50 - 1.10 mg/dL   Calcium 9.0  8.4 - 09.6 mg/dL   GFR calc non Af Amer >90  >90 mL/min   GFR calc Af Amer >90  >90 mL/min   Comment:            The eGFR has been calculated     using the CKD EPI equation.     This calculation has not been     validated in all clinical     situations.     eGFR's persistently     <90 mL/min signify     possible Chronic Kidney Disease.  CBC WITH DIFFERENTIAL     Status: Abnormal   Collection Time    11/19/12  9:11 AM      Result Value Range   WBC 7.0  4.0 - 10.5 K/uL   RBC 4.35  3.87 - 5.11 MIL/uL   Hemoglobin 11.9 (*) 12.0 - 15.0 g/dL   HCT 04.5  40.9 - 81.1 %   MCV 86.2  78.0 - 100.0 fL   MCH 27.4  26.0 - 34.0 pg   MCHC 31.7  30.0 - 36.0 g/dL   RDW 91.4  78.2 - 95.6 %   Platelets 314  150 - 400 K/uL   Neutrophils Relative 64  43 - 77 %   Neutro Abs 4.5  1.7 - 7.7 K/uL   Lymphocytes Relative 31  12 - 46 %   Lymphs Abs 2.2  0.7 - 4.0 K/uL   Monocytes Relative 4  3 -  12 %   Monocytes Absolute 0.3  0.1 - 1.0 K/uL   Eosinophils Relative 2  0 - 5 %   Eosinophils Absolute 0.1  0.0 - 0.7 K/uL   Basophils Relative 0  0 - 1 %   Basophils Absolute 0.0  0.0 - 0.1 K/uL  TROPONIN I     Status: None   Collection Time     11/19/12  9:11 AM      Result Value Range   Troponin I <0.30  <0.30 ng/mL   Comment:            Due to the release kinetics of cTnI,     a negative result within the first hours     of the onset of symptoms does not rule out     myocardial infarction with certainty.     If myocardial infarction is still suspected,     repeat the test at appropriate intervals.  PRO B NATRIURETIC PEPTIDE     Status: Abnormal   Collection Time    11/19/12  9:11 AM      Result Value Range   Pro B Natriuretic peptide (BNP) 509.9 (*) 0 - 125 pg/mL  GLUCOSE, CAPILLARY     Status: Abnormal   Collection Time    11/19/12  4:39 PM      Result Value Range   Glucose-Capillary 199 (*) 70 - 99 mg/dL  GLUCOSE, CAPILLARY     Status: Abnormal   Collection Time    11/19/12  6:49 PM      Result Value Range   Glucose-Capillary 326 (*) 70 - 99 mg/dL   Comment 1 Notify RN     Comment 2 Documented in Chart    GLUCOSE, CAPILLARY     Status: Abnormal   Collection Time    11/19/12 10:05 PM      Result Value Range   Glucose-Capillary 311 (*) 70 - 99 mg/dL  BASIC METABOLIC PANEL     Status: Abnormal   Collection Time    11/20/12  6:00 AM      Result Value Range   Sodium 137  135 - 145 mEq/L   Potassium 3.6  3.5 - 5.1 mEq/L   Chloride 97  96 - 112 mEq/L   CO2 32  19 - 32 mEq/L   Glucose, Bld 158 (*) 70 - 99 mg/dL   BUN 13  6 - 23 mg/dL   Creatinine, Ser 1.61  0.50 - 1.10 mg/dL   Calcium 8.7  8.4 - 09.6 mg/dL   GFR calc non Af Amer 75 (*) >90 mL/min   GFR calc Af Amer 86 (*) >90 mL/min   Comment:            The eGFR has been calculated     using the CKD EPI equation.     This calculation has not been     validated in all clinical     situations.     eGFR's persistently     <90 mL/min signify     possible Chronic Kidney Disease.  GLUCOSE, CAPILLARY     Status: Abnormal   Collection Time    11/20/12  7:52 AM      Result Value Range   Glucose-Capillary 144 (*) 70 - 99 mg/dL   Comment 1 Notify RN      Comment 2 Documented in Chart      Dg Chest Portable 1 View  11/19/2012  *RADIOLOGY REPORT*  Clinical Data: Shortness of  breath  PORTABLE CHEST - 1 VIEW  Comparison: 06/21/2012  Findings: Cardiomegaly with pulmonary vascular congestion.  Very mild / early interstitial edema is possible.  No pleural effusion or pneumothorax.  Left subclavian ICD.  IMPRESSION: Cardiomegaly with pulmonary vascular congestion.  Very mild / early interstitial edema is possible.   Original Report Authenticated By: Charline Bills, M.D.     ROS See HPI Eyes: Negative Ears:Negative for hearing loss, tinnitus Cardiovascular: Negative for chest pain, palpitations,irregular heartbeat, dyspnea, dyspnea on exertion, near-syncope, orthopnea, paroxysmal nocturnal dyspnea and syncope,edema, claudication, cyanosis,.  Respiratory:   Negative for cough, hemoptysis, shortness of breath, sleep disturbances due to breathing, sputum production and wheezing.   Endocrine: Negative for cold intolerance and heat intolerance.  Hematologic/Lymphatic: Negative for adenopathy and bleeding problem. Does not bruise/bleed easily.  Musculoskeletal: Negative.   Gastrointestinal: Negative for nausea, vomiting, reflux, abdominal pain, diarrhea, constipation.   Genitourinary: Negative for bladder incontinence, dysuria, flank pain, frequency, hematuria, hesitancy, nocturia and urgency.  Neurological: Negative.  Allergic/Immunologic: Negative for environmental allergies.  Blood pressure 91/57, pulse 85, temperature 98.1 F (36.7 C), temperature source Oral, resp. rate 22, height 5\' 4"  (1.626 m), weight 277 lb 1.9 oz (125.7 kg), last menstrual period 06/21/2012, SpO2 97.00%. Physical Exam PHYSICAL EXAM:  Obese black female Neck: No JVD, HJR, Bruit, or thyroid enlargement Lungs: crackles at base with no wheezing clear anteriorly Cardiovascular: RRR, PMI not displaced, heart sounds normal, no murmurs, gallops, bruit, thrill, or heave. Abdomen: BS  normal. Soft without organomegaly, masses, lesions or tenderness. Extremities: without cyanosis, clubbing or edema. Good distal pulses bilateral SKin: Warm, no lesions or rashes  Musculoskeletal: No deformities Neuro: no focal signs  EKG: Atrial sensed Ventricular paced, NSR Telemetry:paced rhythm 2Decho 2011:  Study Conclusions   - Left ventricle: The cavity size was severely dilated. Systolic    function was severely reduced. The estimated ejection fraction was    in the range of 25% to 30%.  - Mitral valve: Mild regurgitation.  - Left atrium: The atrium was mildly dilated.  Impressions:   - Extremely limited dueto poor sound wave transmission; LV function    appears to be significantly reduced but difficult to quantitate;    suggest TEE or MUGA if clinically indicated.   Assessment/Plan: Acute on chronic Congestive heart failure diuresed over 2 L overnight  Continue iv lasix bid today D/C in am on previous home doses of meds.  F/U with GT/RR in 1-2 weeks in our office  Ok to discharge in am for dental appt in [place]. Has infection that needs Rx   Nonischemic cardiomyopathy ejection fraction 25% Status post BiV ICD History of moderate to severe mitral regurgitation History of normal coronary arteries on cardiac catheter in 2006 Hypertension Diabetes mellitus Asthma Morbid obesity History of GI bleed in 2010 History of AVMs status post surgery in May 2012  [name] 11/20/2012, 8:18 AM

## 2012-11-20 NOTE — Care Management Note (Signed)
    Page 1 of 1   11/21/2012     8:56:38 AM   CARE MANAGEMENT NOTE 11/21/2012  Patient:  EVELENA, MASCI   Account Number:  [id number]  Date Initiated:  11/20/2012  Documentation initiated by:  Sharrie Rothman  Subjective/Objective Assessment:   Pt admitted from home with CHF. Pt lives with her daughter who is 47 years old. Pt has a CAP  aide 1.5 hours a day, M-F with [facility]. Pt has a shower chair, walker, cane, cpap, and neb machine. Pt will return home with daughter.     Action/Plan:   Pt feels that she does not need HH at discharge. No CM needs noted.   Anticipated DC Date:  11/21/2012   Anticipated DC Plan:  HOME/SELF CARE      DC Planning Services  CM consult      Choice offered to / List presented to:             Status of service:  Completed, signed off Medicare Important Message given?  YES (If response is "NO", the following Medicare IM given date fields will be blank) Date Medicare IM given:  11/21/2012 Date Additional Medicare IM given:    Discharge Disposition:  HOME/SELF CARE  Per UR Regulation:    If discussed at Long Length of Stay Meetings, dates discussed:    Comments:  11/21/12 0855 Arlyss Queen, RN BSN CM Pt discharged home today. No CM needs noted.  11/20/12 1455 Arlyss Queen, RN BSN CM

## 2012-11-20 NOTE — H&P (Signed)
[name], [name]               ACCOUNT NO.:  [id number]  MEDICAL RECORD NO.:  [id number]  LOCATION:  A325                          FACILITY:  [facility]  PHYSICIAN:  Mila Homer. [name], M.D.DATE OF BIRTH:  1966/06/30  DATE OF ADMISSION:  11/19/2012 DATE OF DISCHARGE:  LH                             HISTORY & PHYSICAL   This 47 year old woman presented to the [facility] emergency room today due to increased shortness of breath over the last 3 days.  She has also noted swelling of her ankles.  She has a long history of congestive heart failure and idiopathic cardiomyopathy.  Other diagnoses include morbid obesity, anemia of chronic disease, obstructive sleep apnea, iron-deficiency anemia, and AV malformations as well as reflux esophagitis.  In addition, she has had irritable bowel syndrome, asthma, hypertension, mitral regurgitation. Her cardiac cath in October 2006 was normal and mitral regurgitation was moderate to severe.  Her most recent ejection fraction was 25%.  Her heart failure is chronic systolic heart failure.  For the last month, she has had a dental abscess.  She was seen in the office and started on Augmentin.  At that time, she had swelling and tenderness just inferior to the right nare.  This all cleared after a course of antibiotics but she developed a yeast vaginitis. She has seen a dentist and is going to have 2 teeth removed because of dental abscess.  Other than the diarrhea, she has had no GI symptomatology.  Considering her lungs, she does not have cough or fever, but just the shortness of breath.  She has had no chest pain.  Surgical history includes implantation of cardiac defibrillator, knee arthroscopy, cholecystectomy, appendectomy, a left partial mastectomy, and I and D of an abscess of her chest wall.  She has had a colonoscopy, EGD, and Given capsule study.  CURRENT MEDICATIONS: 1. Albuterol by nebulizer. 2. Albuterol  inhaler. 3. Benazepril 40 mg daily. 4. Carvedilol 25 mg b.i.d. 5. Cyclobenzaprine 10 mg t.i.d. p.r.n. muscle spasms. 6. Implanon device injected in the skin every 3 years. 7. Ferrous sulfate 325 t.i.d. 8. Gabapentin 300 mg 2 caps in the morning, one at noon, two at     bedtime. 9. Hydralazine 100 mg t.i.d. 10.Hydrocodone/APAP q.6 hours p.r.n. pain. 11.NovoLog insulin by sliding scale. 12.Levemir insulin 80 units daily. 13.Magnesium oxide 4 mg daily. 14.Isosorbide mononitrate 60 mg daily. 15.Omeprazole 40 mg daily. 16.KCl 20 mg 5 times a day. 17.Torsemide 20 mg two or three a day.  The patient tried to increase her torsemide to three a day to see if it would clear up her shortness of breath, but it did not.  PHYSICAL EXAM:  GENERAL:  Admission exam, my part, after the patient has received breathing treatments and looped diuretics in the ER, shows a pleasant morbidly obese woman who is eating supper. VITAL SIGNS:  Temperature is 98.5, pulse 86, respiratory rate 22, blood pressure 116/81, O2 sats 94%. HEENT:  She does have tenderness between the nose and the mouth, but the swelling has cleared. NEUROLOGIC:  She appears to be oriented and alert.  Her speech is normal.  Her sensorium is intact. HEART:  Her heart  has a regular rhythm and a rate of 80. LUNGS:  Her lungs appear clear throughout. ABDOMEN:  Her abdomen is soft without organomegaly or mass, or tenderness.  But, she is morbidly obese. EXTREMITIES:  There is 1+ edema of the distal legs and ankles.  Her admission white cell count is 7000 of which 64% are neutrophils. Her hemoglobin is 11.98 and her potassium is 3.4, chloride 94, bicarb 33, glucose 199.  Troponin is 0.30 and the BNP is 509.  The 12-lead EKG shows a ventricular paced rhythm by a biventricular pacemaker with a rate of 96.  Portable chest x-ray shows cardiomegaly and pulmonary vascular congestion.  Very mild early interstitial edema was also felt to  be possible.  ADMISSION DIAGNOSES: 1. Acute on chronic systolic heart failure. 2. Tooth abscess which may be the stressor which contributed to the     congestive heart failure exacerbation. 3. Edema. 4. Type 2 diabetes on insulin. 5. Nonischemic cardiomyopathy with ejection fraction of 25%. 6. Mitral regurgitation felt to be moderate to severe. 7. Benign essential hypertension. 8. Asthma. 9. Gastroesophageal reflux disease. 10.Obstructive sleep apnea. 11.Iron deficiency anemia.  Currently doing well. 12.Yeast vaginitis.  The patient was admitted to the hospital.  She has already received treatment that has stabilized her.  She will be on her current meds but instead of torsemide I will use furosemide 40 mg IV q.12 hours.  I will also try to treat her with IV antibiotics and anti-yeast medicine by mouth.  She will have pain medication as needed.  I will have [facility] see her in consult tomorrow.     Mila Homer. [name], M.D.     SDK/MEDQ  D:  11/19/2012  T:  11/20/2012  Job:  [id number]

## 2012-11-20 NOTE — Progress Notes (Signed)
ANTIBIOTIC CONSULT NOTE - INITIAL  Pharmacy Consult for Unasyn Indication: tooth abscess  No Known Allergies  Patient Measurements: Height: 5\' 4"  (162.6 cm) Weight: 277 lb 1.9 oz (125.7 kg) IBW/kg (Calculated) : 54.7 Adjusted Body Weight: 83Kg  Vital Signs: Temp: 98.1 F (36.7 C) (03/12 0520) BP: 91/57 mmHg (03/12 0520) Pulse Rate: 85 (03/12 0520) Intake/Output from previous day: 03/11 0701 - 03/12 0700 In: 240 [P.O.:240] Out: 2700 [Urine:2700] Intake/Output from this shift: Total I/O In: 120 [P.O.:120] Out: -   Labs:  Recent Labs  11/19/12 0911 11/20/12 0600  WBC 7.0  --   HGB 11.9*  --   PLT 314  --   CREATININE 0.76 0.91   Estimated Creatinine Clearance: 101.3 ml/min (by C-G formula based on Cr of 0.91). No results found for this basename: VANCOTROUGH, VANCOPEAK, VANCORANDOM, GENTTROUGH, GENTPEAK, GENTRANDOM, TOBRATROUGH, TOBRAPEAK, TOBRARND, AMIKACINPEAK, AMIKACINTROU, AMIKACIN,  in the last 72 hours   Microbiology: No results found for this or any previous visit (from the past 720 hour(s)).  Medical History: Past Medical History  Diagnosis Date  . CHF (congestive heart failure)     chronic systolic CHF,  . Nonischemic cardiomyopathy     EF 25%  . Mitral regurgitation     moderate to severe  . S/P cardiac catheterization 10/06    normal coronary arteries  . LBBB (left bundle branch block)   . HTN (hypertension)   . Asthma   . IBS (irritable bowel syndrome)     with primarily constipation  . Morbid obesity   . GERD (gastroesophageal reflux disease)   . IDA (iron deficiency anemia)     2o TO SB AVMS, Hx parenteral iron Dr Mariel Sleet  . AVM (arteriovenous malformation)     Small bowel, s/p duble balloon enteroscopy/APC jejunum Dr Gwinda Passe [facility]) 01/23/2011  . Anemia, chronic disease   . Peripheral neuropathy   . OSA (obstructive sleep apnea)     on CPAP qhs  . GI bleed 06/2009    Hgb 7.5, ferritin 7, transfusion, iron IV  . Dyspnea on exertion      chronic   Medications:  Scheduled:  . [COMPLETED] acetaminophen  650 mg Oral Once  . albuterol  2.5 mg Nebulization TID  . [COMPLETED] albuterol  5 mg Nebulization Once  . ampicillin-sulbactam (UNASYN) IV  1.5 g Intravenous Q6H  . benazepril  40 mg Oral Daily  . carvedilol  25 mg Oral BID  . enoxaparin (LOVENOX) injection  40 mg Subcutaneous Q24H  . ferrous sulfate  325 mg Oral TID  . fluconazole  100 mg Oral Daily  . furosemide  40 mg Intravenous BID  . gabapentin  300 mg Oral Q1200  . gabapentin  600 mg Oral Q24H  . gabapentin  600 mg Oral QHS  . hydrALAZINE  100 mg Oral TID  . insulin aspart  0-9 Units Subcutaneous TID WC  . insulin detemir  80 Units Subcutaneous QHS  . isosorbide mononitrate  60 mg Oral TID  . magnesium oxide  400 mg Oral Daily  . [COMPLETED] ondansetron (ZOFRAN) IV  4 mg Intravenous Once  . [COMPLETED] ondansetron  8 mg Oral Once  . pantoprazole  40 mg Oral Daily  . potassium chloride SA  20 mEq Oral 5 X Daily  . [DISCONTINUED] insulin aspart  0-20 Units Subcutaneous TID WC   Assessment: 47yo obese female admitted last night c/o SOB and ankle swelling.  Pt has had a dental abscess for the last month.  Pt  is morbidly obese with good renal fxn.  Estimated Creatinine Clearance: 101.3 ml/min (by C-G formula based on Cr of 0.91). Unasyn started last night per protocol.  Per report, anticipate pt will be discharged 3/13 for dental appointment.  Pt is afebrile. Goal of Therapy:  Eradicate infection.  Plan: Unasyn 1.5gm IV q6hrs Monitor labs and renal fxn per protocol  Valrie Hart A 11/20/2012,10:52 AM

## 2012-11-21 LAB — BASIC METABOLIC PANEL
BUN: 14 mg/dL (ref 6–23)
CO2: 29 mEq/L (ref 19–32)
GFR calc non Af Amer: 77 mL/min — ABNORMAL LOW (ref >90)
Glucose, Bld: 164 mg/dL — ABNORMAL HIGH (ref 70–99)
Potassium: 3.9 mEq/L (ref 3.5–5.1)

## 2012-11-21 LAB — GLUCOSE, CAPILLARY: Glucose-Capillary: 136 mg/dL — ABNORMAL HIGH (ref 70–99)

## 2012-11-21 MED ORDER — FLUCONAZOLE 100 MG PO TABS: 100.0000 mg | ORAL_TABLET | Freq: Every day | ORAL | Status: DC

## 2012-11-21 MED ORDER — ALBUTEROL SULFATE (5 MG/ML) 0.5% IN NEBU: 2.5000 mg | INHALATION_SOLUTION | RESPIRATORY_TRACT | Status: DC | PRN

## 2012-11-21 MED ORDER — AMOXICILLIN-POT CLAVULANATE 875-125 MG PO TABS: 1.0000 | ORAL_TABLET | Freq: Two times a day (BID) | ORAL | Status: DC

## 2012-11-21 NOTE — Discharge Summary (Signed)
NAMEMEGHIN, THIVIERGE               ACCOUNT NO.:  [id number]  MEDICAL RECORD NO.:  [id number]  LOCATION:  A325                          FACILITY:  [facility]  PHYSICIAN:  Mila Homer. [name], M.D.DATE OF BIRTH:  12-07-1965  DATE OF ADMISSION:  11/19/2012 DATE OF DISCHARGE:  03/13/2014LH                              DISCHARGE SUMMARY   This 47 year old developed increased shortness of breath and swelling of her ankles.  Was admitted to the hospital with congestive heart failure November 19, 2012.  She had a benign 3-day hospitalization extending to November 21, 2012.  Her admission white cell count was 7,000, of which 64% were neutrophils, and her hemoglobin was 11.9.  Potassium was 3.4 with a chloride of 94, bicarb 33, glucose 199, and the BNP 509.  Her potassium improved to 3.9 on treatment, and the glucose was stable in the 100s.  Admission chest x-ray showed cardiomegaly with pulmonary vascular congestion and very mild or early interstitial edema.  Her 12-lead EKG:  She had a biventricular pacemaker, rate 96.  She was admitted to the hospital.  She was started on furosemide 40 mg IV q.12 hours and continued on her home medications, including gabapentin, cyclobenzaprine, hydralazine, magnesium oxide, potassium chloride, ferrous sulfate, carvedilol, and benazepril.  She was put on Unasyn IV for her tooth abscess and Protonix rather than omeprazole for reflux.  She did well on this regimen.  She was seen by cardiology.  No medication changes were made by them.  By her 3rd day, she was ready for discharge home, following which she was to see her dentist the same morning of discharge to remove the 2 offending teeth in the right upper jaw.  DISCHARGE DIAGNOSES: 1. Acute-on-chronic systolic heart failure. 2. Tooth abscess of the right upper jaw. 3. Type 2 diabetes, on insulin. 4. Nonischemic cardiomyopathy with most recent ejection fraction 25%. 5. Moderate-to-severe mitral  regurgitation. 6. Benign essential hypertension. 7. Gastroesophageal reflux disease. 8. Obstructive sleep apnea. 9. Iron deficiency anemia, doing well. 10.Yeast vaginitis. 11.Hypokalemia.  She is discharged home on: 1. Albuterol by nebulizer (with a prescription sent to her pharmacy     for a year supply of it). 2. Benazepril 40 mg daily. 3. Carvedilol 25 mg b.i.d. 4. Cyclobenzaprine 10 mg t.i.d. p.r.n. muscle spasm. 5. Ferrous sulfate 325 t.i.d. 6. Gabapentin 300 mg 2 in the morning, 1 at noon, and 2 at bedtime. 7. Hydralazine 100 mg t.i.d. 8. Hydrocodone/APAP 10/325 q.6 h p.r.n. pain. 9. NovoLog insulin 0 to 20 units t.i.d. with meals. 10.Levemir insulin 80 units q.h.s. 11.Isosorbide mononitrate 60 mg t.i.d. 12.Magnesium oxide 400 mg daily. 13.Omeprazole 40 mg daily. 14.Potassium chloride 20 mEq 5 times a day. 15.Torsemide 40 mg daily.  At times, she goes up to 60 mg daily on     this.  Prescriptions were written for: 1. Amoxicillin/clavulanate acid 875/125 b.i.d. (20 with 2 refills). 2. Albuterol by nebulizer 0.5 mL in her nebulizer q.4 hours (20 mL     with 11 refills). 3. Fluconazole 100 mg daily #10 with a refill.  She will be seen by [facility] in the near future and seen in my office within the next several weeks.  At that time, I will recheck a potassium level for her.     Mila Homer. [name], M.D.     SDK/MEDQ  D:  11/21/2012  T:  11/21/2012  Job:  [id number]

## 2012-11-21 NOTE — Progress Notes (Signed)
Saline lock removed. Telemetry monitored removed. discharge instructions given. prescriptions given. Heart failure education given. Pt verbalized understanding of instructions. Left floor via wheelchair with nursing staff and family.

## 2012-11-27 NOTE — Progress Notes (Signed)
UR Chart Review Completed  

## 2012-12-04 ENCOUNTER — Ambulatory Visit (INDEPENDENT_AMBULATORY_CARE_PROVIDER_SITE_OTHER): Payer: Medicare Other | Admitting: Cardiology

## 2012-12-04 ENCOUNTER — Encounter: Payer: Self-pay | Admitting: Cardiology

## 2012-12-04 VITALS — BP 142/76 | HR 92 | Ht 65.0 in | Wt 274.9 lb

## 2012-12-04 DIAGNOSIS — I340 Nonrheumatic mitral (valve) insufficiency: Secondary | ICD-10-CM

## 2012-12-04 DIAGNOSIS — I059 Rheumatic mitral valve disease, unspecified: Secondary | ICD-10-CM

## 2012-12-04 DIAGNOSIS — Z95810 Presence of automatic (implantable) cardiac defibrillator: Secondary | ICD-10-CM

## 2012-12-04 DIAGNOSIS — I5022 Chronic systolic (congestive) heart failure: Secondary | ICD-10-CM

## 2012-12-04 MED ORDER — CARVEDILOL 25 MG PO TABS: 25.0000 mg | ORAL_TABLET | Freq: Two times a day (BID) | ORAL | Status: DC

## 2012-12-04 MED ORDER — TORSEMIDE 20 MG PO TABS: 40.0000 mg | ORAL_TABLET | Freq: Every day | ORAL | Status: DC

## 2012-12-04 NOTE — Patient Instructions (Addendum)
Your physician recommends that you schedule a follow-up appointment in: 6 MONTHS WITH CARDIOLOGY

## 2012-12-04 NOTE — Assessment & Plan Note (Signed)
Stable with the regimen. Reinforced daily weights and taking extra demadex and potassium when her weight is outside the window. We have renewed all necessary medications today. She is scheduled for defibrillator check in April. Return the office with [place] in 6 months.

## 2012-12-04 NOTE — Progress Notes (Signed)
HPI [name] returns today for evaluation and management of her chronic systolic heart failure. She was recently admitted to the hospital with progressive weight gain with acute on chronic systolic heart failure. She responded well to intravenous diuretics. She also had a tooth abscess and was treated with antibiotics.  Since discharge her weight has been relatively stable. Her window is 273-280. She's very compliant with her medications. She needs carvedilol renewed. She's currently taking 40 mg of Demadex each morning.  Past Medical History  Diagnosis Date  . CHF (congestive heart failure)     chronic systolic CHF,  . Nonischemic cardiomyopathy     EF 25%  . Mitral regurgitation     moderate to severe  . S/P cardiac catheterization 10/06    normal coronary arteries  . LBBB (left bundle branch block)   . HTN (hypertension)   . Asthma   . IBS (irritable bowel syndrome)     with primarily constipation  . Morbid obesity   . GERD (gastroesophageal reflux disease)   . IDA (iron deficiency anemia)     2o TO SB AVMS, Hx parenteral iron Dr Mariel Sleet  . AVM (arteriovenous malformation)     Small bowel, s/p duble balloon enteroscopy/APC jejunum Dr Gwinda Passe [facility]) 01/23/2011  . Anemia, chronic disease   . Peripheral neuropathy   . OSA (obstructive sleep apnea)     on CPAP qhs  . GI bleed 06/2009    Hgb 7.5, ferritin 7, transfusion, iron IV  . Dyspnea on exertion     chronic    Current Outpatient Prescriptions  Medication Sig Dispense Refill  . albuterol (PROVENTIL) (5 MG/ML) 0.5% nebulizer solution Take 0.5 mLs (2.5 mg total) by nebulization every 4 (four) hours as needed for wheezing or shortness of breath.  20 mL  11  . albuterol (VENTOLIN HFA) 108 (90 BASE) MCG/ACT inhaler Inhale 2 puffs into the lungs every 6 (six) hours as needed. For shortness of breath      . benazepril (LOTENSIN) 40 MG tablet Take 40 mg by mouth daily.       . carvedilol (COREG) 25 MG tablet Take 1 tablet (25  mg total) by mouth 2 (two) times daily.  60 tablet  5  . cyclobenzaprine (FLEXERIL) 10 MG tablet Take 10 mg by mouth 3 (three) times daily as needed. For muscle spasms      . Etonogestrel (IMPLANON ) Inject 1 each into the skin once. Gets every 3 years      . ferrous sulfate 325 (65 FE) MG tablet Take 325 mg by mouth 3 (three) times daily.      Marland Kitchen gabapentin (NEURONTIN) 300 MG capsule Take 300-600 mg by mouth 2 (two) times daily. 2 capsules in the morning, 1 at noon, and 2 at bedtime      . hydrALAZINE (APRESOLINE) 100 MG tablet Take 100 mg by mouth 3 (three) times daily. For heart failure      . HYDROcodone-acetaminophen (NORCO) 10-325 MG per tablet Take 1 tablet by mouth every 6 (six) hours as needed for pain.      Marland Kitchen insulin aspart (NOVOLOG) 100 UNIT/ML injection Inject 0-20 Units into the skin 3 (three) times daily with meals.  1 vial  5  . insulin detemir (LEVEMIR) 100 UNIT/ML injection Inject 80 Units into the skin at bedtime.      . isosorbide mononitrate (IMDUR) 60 MG 24 hr tablet Take 60 mg by mouth 3 (three) times daily. For heart failure      .  magnesium oxide (MAG-OX) 400 MG tablet Take 400 mg by mouth daily.      Marland Kitchen omeprazole (PRILOSEC) 40 MG capsule Take 40 mg by mouth daily.       . penicillin v potassium (VEETID) 500 MG tablet       . permethrin (ELIMITE) 5 % cream       . potassium chloride SA (K-DUR,KLOR-CON) 20 MEQ tablet Take 20 mEq by mouth 5 (five) times daily.       Marland Kitchen torsemide (DEMADEX) 20 MG tablet Take 2 tablets (40 mg total) by mouth daily. For heart failure  60 tablet  11   No current facility-administered medications for this visit.    No Known Allergies  Family History  Problem Relation Age of Onset  . Coronary artery disease      premature CAD- no family Hx   . Colon cancer Neg Hx     no family Hx of polyps too  . Cervical cancer Mother   . Hypertension Mother   . Heart disease Father   . Hypertension Father   . GER disease Father     History    Social History  . Marital Status: Single    Spouse Name: [name]    Number of Children: 1  . Years of Education: [name]   Occupational History  . disabled    Social History Main Topics  . Smoking status: Former Smoker -- 0.50 packs/day for 20 years    Types: Cigarettes    Quit date: 11/19/2009  . Smokeless tobacco: Not on file  . Alcohol Use: No  . Drug Use: No  . Sexually Active: Yes    Birth Control/ Protection: Implant   Other Topics Concern  . 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   Single- 1 daughter age 46.    Does not get regular exercise.      ROS ALL NEGATIVE EXCEPT THOSE NOTED IN HPI  PE  General Appearance: well developed, well nourished in no acute distress, morbidly obese HEENT: symmetrical face, PERRLA, good dentition  Neck: no JVD, thyromegaly, or adenopathy, trachea midline Chest: symmetric without deformity Cardiac: PMI POORLY APPRECIATED, RRR, normal S1, S2, no gallop or murmur Lung: clear to ausculation and percussion Vascular: all pulses full without bruits  Abdominal:, nontender, good bowel sounds,  Extremities: no cyanosis, clubbing or edema, no sign of DVT, no varicosities  Skin: normal color, no rashes Neuro: alert and oriented x 3, non-focal Pysch: normal affect  EKG  BMET    Component Value Date/Time   NA 137 11/21/2012 0532   K 3.9 11/21/2012 0532   CL 99 11/21/2012 0532   CO2 29 11/21/2012 0532   GLUCOSE 164* 11/21/2012 0532   BUN 14 11/21/2012 0532   CREATININE 0.89 11/21/2012 0532   CREATININE 0.83 02/06/2012 1520   CALCIUM 8.6 11/21/2012 0532   GFRNONAA 77* 11/21/2012 0532   GFRAA 89* 11/21/2012 0532    Lipid Panel     Component Value Date/Time   CHOL 195 06/21/2012 1815   TRIG 233* 06/21/2012 1815   HDL 45 06/21/2012 1815   CHOLHDL 4.3 06/21/2012 1815   VLDL 47* 06/21/2012 1815   LDLCALC 103* 06/21/2012 1815    CBC    Component Value Date/Time   WBC 7.0 11/19/2012 0911   RBC 4.35  11/19/2012 0911   HGB 11.9* 11/19/2012 0911   HCT 37.5 11/19/2012 0911   PLT 314 11/19/2012 0911  MCV 86.2 11/19/2012 0911   MCH 27.4 11/19/2012 0911   MCHC 31.7 11/19/2012 0911   RDW 15.5 11/19/2012 0911   LYMPHSABS 2.2 11/19/2012 0911   MONOABS 0.3 11/19/2012 0911   EOSABS 0.1 11/19/2012 0911   BASOSABS 0.0 11/19/2012 0911

## 2012-12-19 ENCOUNTER — Ambulatory Visit (INDEPENDENT_AMBULATORY_CARE_PROVIDER_SITE_OTHER): Payer: Medicare Other | Admitting: *Deleted

## 2012-12-19 ENCOUNTER — Other Ambulatory Visit: Payer: Self-pay | Admitting: Internal Medicine

## 2012-12-19 DIAGNOSIS — Z95810 Presence of automatic (implantable) cardiac defibrillator: Secondary | ICD-10-CM

## 2012-12-19 DIAGNOSIS — I5022 Chronic systolic (congestive) heart failure: Secondary | ICD-10-CM

## 2012-12-26 LAB — REMOTE ICD DEVICE
AL AMPLITUDE: 1.1 mv
BRDY-0002RA: 60 {beats}/min
BRDY-0003RA: 120 {beats}/min
CHARGE TIME: 9.4 s
DEV-0020ICD: NEGATIVE
DEVICE MODEL ICD: 108094
HV IMPEDENCE: 50 Ohm
LV LEAD IMPEDENCE ICD: 990 Ohm
RV LEAD IMPEDENCE ICD: 493 Ohm
TOT-0006: 20140106000000
TZAT-0001FASTVT: 2
TZAT-0001SLOWVT: 1
TZAT-0013FASTVT: 1
TZAT-0013SLOWVT: 3
TZAT-0018FASTVT: NEGATIVE
TZAT-0018SLOWVT: NEGATIVE
TZST-0001FASTVT: 4
TZST-0001FASTVT: 6
TZST-0001FASTVT: 7
TZST-0001SLOWVT: 3
TZST-0001SLOWVT: 4
TZST-0001SLOWVT: 5
TZST-0001SLOWVT: 7
TZST-0003FASTVT: 31 J
TZST-0003FASTVT: 41 J
TZST-0003SLOWVT: 31 J
TZST-0003SLOWVT: 41 J
TZST-0003SLOWVT: 41 J

## 2013-01-07 ENCOUNTER — Encounter: Payer: Self-pay | Admitting: *Deleted

## 2013-01-10 ENCOUNTER — Encounter: Payer: Self-pay | Admitting: Internal Medicine

## 2013-03-24 ENCOUNTER — Telehealth: Payer: Self-pay | Admitting: Cardiology

## 2013-03-24 NOTE — Telephone Encounter (Signed)
Please advise if pt can have extra.

## 2013-03-24 NOTE — Telephone Encounter (Signed)
Patient needs refill on fluid pill called to [facility]. States that she was given the ok to up her dose as needed.  So now she will need it called in because it is too early to refill. / tgs

## 2013-03-25 MED ORDER — TORSEMIDE 20 MG PO TABS: 40.0000 mg | ORAL_TABLET | Freq: Every day | ORAL | Status: DC

## 2013-03-25 NOTE — Telephone Encounter (Signed)
Sure

## 2013-03-25 NOTE — Telephone Encounter (Signed)
Medication sent via escribe.  

## 2013-03-27 ENCOUNTER — Ambulatory Visit (INDEPENDENT_AMBULATORY_CARE_PROVIDER_SITE_OTHER): Payer: Medicare Other | Admitting: Internal Medicine

## 2013-03-27 ENCOUNTER — Encounter: Payer: Self-pay | Admitting: Internal Medicine

## 2013-03-27 VITALS — BP 116/70 | Ht 65.0 in | Wt 263.0 lb

## 2013-03-27 DIAGNOSIS — Z95810 Presence of automatic (implantable) cardiac defibrillator: Secondary | ICD-10-CM

## 2013-03-27 DIAGNOSIS — I5022 Chronic systolic (congestive) heart failure: Secondary | ICD-10-CM

## 2013-03-27 LAB — ICD DEVICE OBSERVATION
AL IMPEDENCE ICD: 496 Ohm
HV IMPEDENCE: 55 Ohm
RV LEAD IMPEDENCE ICD: 518 Ohm
TZAT-0001FASTVT: 2
TZAT-0001SLOWVT: 1
TZAT-0013SLOWVT: 3
TZAT-0018SLOWVT: NEGATIVE
TZON-0003FASTVT: 300 ms
TZON-0003SLOWVT: 342.9 ms
TZST-0001FASTVT: 3
TZST-0001FASTVT: 6
TZST-0001FASTVT: 7
TZST-0001SLOWVT: 5
TZST-0001SLOWVT: 6
TZST-0003FASTVT: 41 J
TZST-0003FASTVT: 41 J
TZST-0003SLOWVT: 31 J
TZST-0003SLOWVT: 41 J
TZST-0003SLOWVT: 41 J

## 2013-03-27 MED ORDER — CARVEDILOL 25 MG PO TABS: 12.5000 mg | ORAL_TABLET | Freq: Two times a day (BID) | ORAL | Status: DC

## 2013-03-27 MED ORDER — OMEPRAZOLE 40 MG PO CPDR: 40.0000 mg | DELAYED_RELEASE_CAPSULE | Freq: Every day | ORAL | Status: AC

## 2013-03-27 NOTE — Addendum Note (Signed)
Addended by: Thompson Grayer on: 03/27/2013 10:26 AM   Modules accepted: Orders

## 2013-03-27 NOTE — Patient Instructions (Addendum)
Your physician recommends that you schedule a follow-up appointment in: 1 year with Dr [name] and Device Check 06-26-13  Your physician has recommended you make the following change in your medication: 1. Take coreg 12.5 twice a day.

## 2013-03-27 NOTE — Assessment & Plan Note (Signed)
Her [employer] ICD is working normally. We'll plan to recheck in several months. 

## 2013-03-27 NOTE — Progress Notes (Signed)
HPI [name] returns today for followup. She is now a year out from her BiV ICD implant. She c/o feeling weak and tired with little energy. She notes that her blood pressure runs low usually and she will often have to stop what she is doing to rest. No ICD shock. Denies peripheral edema. No Known Allergies   Current Outpatient Prescriptions  Medication Sig Dispense Refill  . albuterol (PROVENTIL) (5 MG/ML) 0.5% nebulizer solution Take 0.5 mLs (2.5 mg total) by nebulization every 4 (four) hours as needed for wheezing or shortness of breath.  20 mL  11  . albuterol (VENTOLIN HFA) 108 (90 BASE) MCG/ACT inhaler Inhale 2 puffs into the lungs every 6 (six) hours as needed. For shortness of breath      . benazepril (LOTENSIN) 40 MG tablet Take 40 mg by mouth daily.       . carvedilol (COREG) 25 MG tablet Take 1 tablet (25 mg total) by mouth 2 (two) times daily.  60 tablet  5  . cyclobenzaprine (FLEXERIL) 10 MG tablet Take 10 mg by mouth 3 (three) times daily as needed. For muscle spasms      . docusate sodium (STOOL SOFTENER) 100 MG capsule Take 100 mg by mouth as directed.      . Etonogestrel (IMPLANON [place]) Inject 1 each into the skin once. Gets every 3 years      . ferrous sulfate 325 (65 FE) MG tablet Take 325 mg by mouth 3 (three) times daily.      Marland Kitchen gabapentin (NEURONTIN) 300 MG capsule Take 300-600 mg by mouth 2 (two) times daily. 2 capsules in the morning, 1 at noon, and 2 at bedtime      . hydrALAZINE (APRESOLINE) 100 MG tablet Take 100 mg by mouth 3 (three) times daily. For heart failure      . HYDROcodone-acetaminophen (NORCO) 10-325 MG per tablet Take 1 tablet by mouth every 6 (six) hours as needed for pain.      Marland Kitchen insulin aspart (NOVOLOG) 100 UNIT/ML injection Inject 0-20 Units into the skin 3 (three) times daily with meals.  1 vial  5  . insulin detemir (LEVEMIR) 100 UNIT/ML injection Inject 80 Units into the skin at bedtime.      . isosorbide mononitrate (IMDUR) 60 MG 24 hr tablet Take  60 mg by mouth 3 (three) times daily. For heart failure      . magnesium oxide (MAG-OX) 400 MG tablet Take 400 mg by mouth daily.      Marland Kitchen omeprazole (PRILOSEC) 40 MG capsule Take 40 mg by mouth daily.       . potassium chloride SA (K-DUR,KLOR-CON) 20 MEQ tablet Take 20 mEq by mouth 5 (five) times daily.       Marland Kitchen torsemide (DEMADEX) 20 MG tablet Take 2 tablets (40 mg total) by mouth daily. For heart failure  60 tablet  11  . vitamin E 400 UNIT capsule Take 400 Units by mouth daily.       No current facility-administered medications for this visit.     Past Medical History  Diagnosis Date  . CHF (congestive heart failure)     chronic systolic CHF,  . Nonischemic cardiomyopathy     EF 25%  . Mitral regurgitation     moderate to severe  . S/P cardiac catheterization 10/06    normal coronary arteries  . LBBB (left bundle branch block)   . HTN (hypertension)   . Asthma   . IBS (irritable bowel  syndrome)     with primarily constipation  . Morbid obesity   . GERD (gastroesophageal reflux disease)   . IDA (iron deficiency anemia)     2o TO SB AVMS, Hx parenteral iron Dr Mariel Sleet  . AVM (arteriovenous malformation)     Small bowel, s/p duble balloon enteroscopy/APC jejunum Dr Gwinda Passe [facility]) 01/23/2011  . Anemia, chronic disease   . Peripheral neuropathy   . OSA (obstructive sleep apnea)     on CPAP qhs  . GI bleed 06/2009    Hgb 7.5, ferritin 7, transfusion, iron IV  . Dyspnea on exertion     chronic    ROS:   All systems reviewed and negative except as noted in the HPI.   Past Surgical History  Procedure Laterality Date  . Crdt-implantation  4/07    [employer]. remote- yes  . Appendectomy    . Cholecystectomy      biliary dyskinesia  . Mastectomy  2003    left, partial  . Knee arthroscopy  2005    left  . Small bowel enteroscopy  MAY 2012 [facility] DR. GILLIAM    SB AVMS s/p APC  . Colonoscopy  OCT 2010/SEP 2011    tortuous colon, 3 polyps-benign(2010),   .  Upper gastrointestinal endoscopy  '08, '10, SEP 2011    mild antral gastritis (2010), negative SB bx (2010), incomlpete Schatzki's ring (2011)  . Small bowel enteroscopy  SEP 2011 [facility]    Fields-NO AVMS  . Givens capsule study  NOV 2010     NO AVMS, normal  . Cardiac defibrillator placement    . Breast surgery    . Irrigation and debridement abscess  06/25/2012    Procedure: MINOR INCISION AND DRAINAGE OF ABSCESS;  Surgeon: [name], MD;  Location: [facility];  Service: General;  Laterality: [name];  Incision & Drainage of Infected Sebaceous Cyst on Chest     Family History  Problem Relation Age of Onset  . Coronary artery disease      premature CAD- no family Hx   . Colon cancer Neg Hx     no family Hx of polyps too  . Cervical cancer Mother   . Hypertension Mother   . Heart disease Father   . Hypertension Father   . GER disease Father      History   Social History  . Marital Status: Single    Spouse Name: [name]    Number of Children: 1  . Years of Education: [name]   Occupational History  . disabled    Social History Main Topics  . Smoking status: Former Smoker -- 0.50 packs/day for 20 years    Types: Cigarettes    Quit date: 11/19/2009  . Smokeless tobacco: Not on file  . Alcohol Use: No  . Drug Use: No  . Sexually Active: Yes    Birth Control/ Protection: Implant   Other Topics Concern  . 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    Does not get regular exercise.       BP 116/70  Ht 5\' 5"  (1.651 m)  Wt 263 lb (119.296 kg)  BMI 43.77 kg/m2  Physical Exam:  obese appearing 47 year old woman,NAD HEENT: Unremarkable Neck:  6 cm JVD, no thyromegally Back:  No CVA tenderness Lungs:  Clear with no wheezes, rales, or rhonchi. HEART:  Regular rate rhythm, no murmurs,  no rubs, no clicks, heart sounds are distant Abd:  soft, positive bowel sounds, no organomegally, no rebound, no  guarding Ext:  2 plus pulses, no edema, no cyanosis, no clubbing Skin:  No rashes no nodules Neuro:  CN II through XII intact, motor grossly intact   DEVICE  Normal device function.  See PaceArt for details.   Assess/Plan:

## 2013-03-27 NOTE — Assessment & Plan Note (Signed)
The patient has continued congestive heart failure symptoms, which appear to be more low output than wet. I've asked the patient to reduce her dose of carvedilol and 25 mg twice a day to 12.5 mg twice a day. We'll see if this improves her symptoms. I've also asked the patient to maintain a low-sodium diet.

## 2013-03-28 ENCOUNTER — Encounter: Payer: Self-pay | Admitting: Internal Medicine

## 2013-04-06 ENCOUNTER — Emergency Department (HOSPITAL_COMMUNITY): Payer: Medicare Other

## 2013-04-06 ENCOUNTER — Encounter (HOSPITAL_COMMUNITY): Payer: Self-pay | Admitting: Emergency Medicine

## 2013-04-06 ENCOUNTER — Emergency Department (HOSPITAL_COMMUNITY)
Admission: EM | Admit: 2013-04-06 | Discharge: 2013-04-06 | Disposition: A | Discharge status: Home / Self Care | Payer: Medicare Other | Attending: Emergency Medicine | Admitting: Emergency Medicine

## 2013-04-06 DIAGNOSIS — Z8669 Personal history of other diseases of the nervous system and sense organs: Secondary | ICD-10-CM | POA: Insufficient documentation

## 2013-04-06 DIAGNOSIS — H538 Other visual disturbances: Secondary | ICD-10-CM | POA: Insufficient documentation

## 2013-04-06 DIAGNOSIS — M79609 Pain in unspecified limb: Secondary | ICD-10-CM | POA: Insufficient documentation

## 2013-04-06 DIAGNOSIS — I5022 Chronic systolic (congestive) heart failure: Secondary | ICD-10-CM | POA: Insufficient documentation

## 2013-04-06 DIAGNOSIS — R0602 Shortness of breath: Secondary | ICD-10-CM | POA: Insufficient documentation

## 2013-04-06 DIAGNOSIS — Z794 Long term (current) use of insulin: Secondary | ICD-10-CM | POA: Insufficient documentation

## 2013-04-06 DIAGNOSIS — R6889 Other general symptoms and signs: Secondary | ICD-10-CM | POA: Insufficient documentation

## 2013-04-06 DIAGNOSIS — Z8719 Personal history of other diseases of the digestive system: Secondary | ICD-10-CM | POA: Insufficient documentation

## 2013-04-06 DIAGNOSIS — E1169 Type 2 diabetes mellitus with other specified complication: Secondary | ICD-10-CM | POA: Insufficient documentation

## 2013-04-06 DIAGNOSIS — J45909 Unspecified asthma, uncomplicated: Secondary | ICD-10-CM | POA: Insufficient documentation

## 2013-04-06 DIAGNOSIS — R059 Cough, unspecified: Secondary | ICD-10-CM | POA: Insufficient documentation

## 2013-04-06 DIAGNOSIS — J329 Chronic sinusitis, unspecified: Secondary | ICD-10-CM

## 2013-04-06 DIAGNOSIS — Z79899 Other long term (current) drug therapy: Secondary | ICD-10-CM | POA: Insufficient documentation

## 2013-04-06 DIAGNOSIS — J3489 Other specified disorders of nose and nasal sinuses: Secondary | ICD-10-CM | POA: Insufficient documentation

## 2013-04-06 DIAGNOSIS — E876 Hypokalemia: Secondary | ICD-10-CM | POA: Insufficient documentation

## 2013-04-06 DIAGNOSIS — G4733 Obstructive sleep apnea (adult) (pediatric): Secondary | ICD-10-CM | POA: Insufficient documentation

## 2013-04-06 DIAGNOSIS — I10 Essential (primary) hypertension: Secondary | ICD-10-CM | POA: Insufficient documentation

## 2013-04-06 DIAGNOSIS — D509 Iron deficiency anemia, unspecified: Secondary | ICD-10-CM | POA: Insufficient documentation

## 2013-04-06 DIAGNOSIS — Z8679 Personal history of other diseases of the circulatory system: Secondary | ICD-10-CM | POA: Insufficient documentation

## 2013-04-06 DIAGNOSIS — R05 Cough: Secondary | ICD-10-CM | POA: Insufficient documentation

## 2013-04-06 DIAGNOSIS — K219 Gastro-esophageal reflux disease without esophagitis: Secondary | ICD-10-CM | POA: Insufficient documentation

## 2013-04-06 DIAGNOSIS — Z87891 Personal history of nicotine dependence: Secondary | ICD-10-CM | POA: Insufficient documentation

## 2013-04-06 DIAGNOSIS — R110 Nausea: Secondary | ICD-10-CM | POA: Insufficient documentation

## 2013-04-06 DIAGNOSIS — R739 Hyperglycemia, unspecified: Secondary | ICD-10-CM

## 2013-04-06 DIAGNOSIS — R51 Headache: Secondary | ICD-10-CM | POA: Insufficient documentation

## 2013-04-06 LAB — CBC WITH DIFFERENTIAL/PLATELET
Eosinophils Absolute: 0.1 10*3/uL (ref 0.0–0.7)
Eosinophils Relative: 2 % (ref 0–5)
HCT: 39 % (ref 36.0–46.0)
Hemoglobin: 13 g/dL (ref 12.0–15.0)
Lymphocytes Relative: 41 % (ref 12–46)
Lymphs Abs: 2.8 10*3/uL (ref 0.7–4.0)
MCH: 28.5 pg (ref 26.0–34.0)
MCV: 85.5 fL (ref 78.0–100.0)
Monocytes Relative: 6 % (ref 3–12)
RBC: 4.56 MIL/uL (ref 3.87–5.11)
WBC: 6.8 10*3/uL (ref 4.0–10.5)

## 2013-04-06 LAB — COMPREHENSIVE METABOLIC PANEL
ALT: 8 U/L (ref 0–35)
Alkaline Phosphatase: 83 U/L (ref 39–117)
BUN: 11 mg/dL (ref 6–23)
CO2: 39 mEq/L — ABNORMAL HIGH (ref 19–32)
Calcium: 8.8 mg/dL (ref 8.4–10.5)
GFR calc Af Amer: >90 mL/min (ref >90)
GFR calc non Af Amer: >90 mL/min (ref >90)
Glucose, Bld: 275 mg/dL — ABNORMAL HIGH (ref 70–99)
Potassium: 3 mEq/L — ABNORMAL LOW (ref 3.5–5.1)
Sodium: 134 mEq/L — ABNORMAL LOW (ref 135–145)
Total Protein: 8.4 g/dL — ABNORMAL HIGH (ref 6.0–8.3)

## 2013-04-06 LAB — GLUCOSE, CAPILLARY: Glucose-Capillary: 186 mg/dL — ABNORMAL HIGH (ref 70–99)

## 2013-04-06 LAB — PRO B NATRIURETIC PEPTIDE: Pro B Natriuretic peptide (BNP): 612.1 pg/mL — ABNORMAL HIGH (ref 0–125)

## 2013-04-06 MED ORDER — POTASSIUM CHLORIDE CRYS ER 20 MEQ PO TBCR
40.0000 meq | EXTENDED_RELEASE_TABLET | Freq: Once | ORAL | Status: AC
  Administered 2013-04-06: 40 meq via ORAL
  Filled 2013-04-06: qty 2

## 2013-04-06 MED ORDER — POTASSIUM CHLORIDE ER 20 MEQ PO TBCR: 20.0000 meq | EXTENDED_RELEASE_TABLET | Freq: Two times a day (BID) | ORAL | Status: DC

## 2013-04-06 MED ORDER — INSULIN ASPART 100 UNIT/ML ~~LOC~~ SOLN
4.0000 [IU] | Freq: Once | SUBCUTANEOUS | Status: AC
  Administered 2013-04-06: 4 [IU] via INTRAVENOUS
  Filled 2013-04-06: qty 1

## 2013-04-06 MED ORDER — SODIUM CHLORIDE 0.9 % IV SOLN
Freq: Once | INTRAVENOUS | Status: AC
  Administered 2013-04-06: 16:00:00 via INTRAVENOUS

## 2013-04-06 MED ORDER — AMOXICILLIN 500 MG PO CAPS: 500.0000 mg | ORAL_CAPSULE | Freq: Three times a day (TID) | ORAL | Status: DC

## 2013-04-06 NOTE — ED Provider Notes (Signed)
CSN: [id number]     Arrival date & time 04/06/13  1435 History  This chart was scribed for Ward Givens, MD by Ardelia Mems, ED Scribe. This patient was seen in room APA07/APA07 and the patient's care was started at 3:22 PM .   First MD Initiated Contact with Patient 04/06/13 1458     Chief Complaint  Patient presents with  . Hyperglycemia  . Foot Pain  . URI    The history is provided by the patient. No language interpreter was used.   HPI Comments: [name] is a 47 y.o. female who presents to the Emergency Department complaining of hyperglycemia over the past week. She states that she checked her blood sugar at home this morning at 500. She states that she takes insulin on a sliding scale, and she took 20 units of insulin earlier today. ED glucose-capillary is 315. She states that she was diagnosed with DM in October 2013, at which time she was found to have a blood sugar of 600, and she states that she lost her vision temporarily. She has some loss of appetite. She is concerned that she has had some blurred vision over the past week.  She also complains of a cough productive of green-yellow sputum with associated nausea, headache congestion and intermittent SOB over the past 2-3 days. She believes that her SOB has been partially due to her congestion. She also complains of having a scratchy itching throat along wit irritation and burning in her upper chest. She states that she has a history of CHF from cardiomyopathy, but that this does not feel related. She denies wheezing. She states that she has taken Zyrtec with some relief. She states that she has a reduced appetite. She denies sick contacts.  She also states that she has a knot to the dorsal surface of her right foot that appeared 3 days ago and is gradually improving. There is no visible knot. She reports associated constant, moderate pain to the area. She reports poor ambulation secondary to this pain. She denies fever,  vomiting, rhinorrhea, sore throat, diarrhea, chest pain, wheezing. She is a former smoker who denies smoking currently. She lives with her daughter and fiancee.  PCP- Dr. John Giovanni Dr. Daleen Squibb, [facility]   Past Medical History  Diagnosis Date  . CHF (congestive heart failure)     chronic systolic CHF,  . Nonischemic cardiomyopathy     EF 25%  . Mitral regurgitation     moderate to severe  . S/P cardiac catheterization 10/06    normal coronary arteries  . LBBB (left bundle branch block)   . HTN (hypertension)   . Asthma   . IBS (irritable bowel syndrome)     with primarily constipation  . Morbid obesity   . GERD (gastroesophageal reflux disease)   . IDA (iron deficiency anemia)     2o TO SB AVMS, Hx parenteral iron Dr Mariel Sleet  . AVM (arteriovenous malformation)     Small bowel, s/p duble balloon enteroscopy/APC jejunum Dr Gwinda Passe [facility]) 01/23/2011  . Anemia, chronic disease   . Peripheral neuropathy   . OSA (obstructive sleep apnea)     on CPAP qhs  . GI bleed 06/2009    Hgb 7.5, ferritin 7, transfusion, iron IV  . Dyspnea on exertion     chronic   Past Surgical History  Procedure Laterality Date  . Crdt-implantation  4/07    [employer]. remote- yes  . Appendectomy    .  Cholecystectomy      biliary dyskinesia  . Mastectomy  2003    left, partial  . Knee arthroscopy  2005    left  . Small bowel enteroscopy  MAY 2012 D[facility] DR. GILLIAM    SB AVMS s/p APC  . Colonoscopy  OCT 2010/SEP 2011    tortuous colon, 3 polyps-benign(2010),   . Upper gastrointestinal endoscopy  '08, '10, SEP 2011    mild antral gastritis (2010), negative SB bx (2010), incomlpete Schatzki's ring (2011)  . Small bowel enteroscopy  SEP 2011 [facility]    Fields-NO AVMS  . Givens capsule study  NOV 2010     NO AVMS, normal  . Cardiac defibrillator placement    . Breast surgery    . Irrigation and debridement abscess  06/25/2012    Procedure: MINOR INCISION AND DRAINAGE  OF ABSCESS;  Surgeon: [name], MD;  Location: [facility] ORS;  Service: General;  Laterality: [name];  Incision & Drainage of Infected Sebaceous Cyst on Chest   Family History  Problem Relation Age of Onset  . Coronary artery disease      premature CAD- no family Hx   . Colon cancer Neg Hx     no family Hx of polyps too  . Cervical cancer Mother   . Hypertension Mother   . Heart disease Father   . Hypertension Father   . GER disease Father    History  Substance Use Topics  . Smoking status: Former Smoker -- 0.50 packs/day for 20 years    Types: Cigarettes    Quit date: 11/19/2009  . Smokeless tobacco: Not on file  . Alcohol Use: No  lives with daughter and fiance   OB History   Grav Para Term Preterm Abortions TAB SAB Ect Mult Living                 Review of Systems  Constitutional: Negative for fever.  HENT: Positive for congestion. Negative for sore throat and rhinorrhea.   Eyes: Positive for visual disturbance (blurred vision).  Respiratory: Positive for cough and shortness of breath. Negative for wheezing.   Cardiovascular: Negative for chest pain.  Gastrointestinal: Positive for nausea. Negative for vomiting and diarrhea.  Neurological: Positive for headaches.  All other systems reviewed and are negative.   Allergies  Review of patient's allergies indicates no known allergies.  Home Medications   Current Outpatient Rx  Name  Route  Sig  Dispense  Refill  . albuterol (PROVENTIL) (5 MG/ML) 0.5% nebulizer solution   Nebulization   Take 0.5 mLs (2.5 mg total) by nebulization every 4 (four) hours as needed for wheezing or shortness of breath.   20 mL   11   . albuterol (VENTOLIN HFA) 108 (90 BASE) MCG/ACT inhaler   Inhalation   Inhale 2 puffs into the lungs every 6 (six) hours as needed. For shortness of breath         . benazepril (LOTENSIN) 40 MG tablet   Oral   Take 40 mg by mouth daily.          . carvedilol (COREG) 25 MG tablet   Oral   Take  0.5 tablets (12.5 mg total) by mouth 2 (two) times daily.   30 tablet   5   . cyclobenzaprine (FLEXERIL) 10 MG tablet   Oral   Take 10 mg by mouth 3 (three) times daily as needed. For muscle spasms         . docusate  sodium (STOOL SOFTENER) 100 MG capsule   Oral   Take 100 mg by mouth as directed.         . Etonogestrel (IMPLANON [place])   Subcutaneous   Inject 1 each into the skin once. Gets every 3 years         . ferrous sulfate 325 (65 FE) MG tablet   Oral   Take 325 mg by mouth 3 (three) times daily.         Marland Kitchen gabapentin (NEURONTIN) 300 MG capsule   Oral   Take 300-600 mg by mouth 2 (two) times daily. 2 capsules in the morning, 1 at noon, and 2 at bedtime         . hydrALAZINE (APRESOLINE) 100 MG tablet   Oral   Take 100 mg by mouth 3 (three) times daily. For heart failure         . HYDROcodone-acetaminophen (NORCO) 10-325 MG per tablet   Oral   Take 1 tablet by mouth every 6 (six) hours as needed for pain.         Marland Kitchen insulin aspart (NOVOLOG) 100 UNIT/ML injection   Subcutaneous   Inject 0-20 Units into the skin 3 (three) times daily with meals.   1 vial   5   . insulin detemir (LEVEMIR) 100 UNIT/ML injection   Subcutaneous   Inject 80 Units into the skin at bedtime.         . isosorbide mononitrate (IMDUR) 60 MG 24 hr tablet   Oral   Take 60 mg by mouth 3 (three) times daily. For heart failure         . magnesium oxide (MAG-OX) 400 MG tablet   Oral   Take 400 mg by mouth daily.         Marland Kitchen omeprazole (PRILOSEC) 40 MG capsule   Oral   Take 1 capsule (40 mg total) by mouth daily.   30 capsule   6   . potassium chloride SA (K-DUR,KLOR-CON) 20 MEQ tablet   Oral   Take 20 mEq by mouth 5 (five) times daily.          Marland Kitchen torsemide (DEMADEX) 20 MG tablet   Oral   Take 2 tablets (40 mg total) by mouth daily. For heart failure   60 tablet   11   . vitamin E 400 UNIT capsule   Oral   Take 400 Units by mouth daily.          Triage  Vitals: BP 115/80  Pulse 93  Temp(Src) 98.9 F (37.2 C) (Oral)  Resp 16  SpO2 96%  Vital signs normal    Physical Exam  Nursing note and vitals reviewed. Constitutional: She is oriented to person, place, and time. She appears well-developed and well-nourished.  Non-toxic appearance. She does not appear ill. No distress.  obese  HENT:  Head: Normocephalic and atraumatic.  Right Ear: External ear normal.  Left Ear: External ear normal.  Nose: Nose normal. No mucosal edema or rhinorrhea.  Mouth/Throat: Oropharynx is clear and moist and mucous membranes are normal. No dental abscesses or edematous.  Eyes: Conjunctivae and EOM are normal. Pupils are equal, round, and reactive to light.  Neck: Normal range of motion and full passive range of motion without pain. Neck supple.  Cardiovascular: Normal rate, regular rhythm and normal heart sounds.  Exam reveals no gallop and no friction rub.   No murmur heard. Pulmonary/Chest: Effort normal and breath sounds normal. No respiratory distress. She  has no wheezes. She has no rhonchi. She has no rales. She exhibits no tenderness and no crepitus.  Abdominal: Soft. Normal appearance and bowel sounds are normal. She exhibits no distension. There is no tenderness. There is no rebound and no guarding.  Musculoskeletal: Normal range of motion. She exhibits no edema and no tenderness.  Moves all extremities well. No obvious swelling seen on the dorsum of her right foot. There is no skin color change, warmth, skin lesions. She has no pitting edema, nontender ankles, cavles.   Neurological: She is alert and oriented to person, place, and time. She has normal strength. No cranial nerve deficit.  Skin: Skin is warm, dry and intact. No rash noted. No erythema. No pallor.  Psychiatric: She has a normal mood and affect. Her speech is normal and behavior is normal. Her mood appears not anxious.    ED Course   Medications  0.9 %  sodium chloride infusion (  Intravenous New Bag/Given 04/06/13 1619)  insulin aspart (novoLOG) injection 4 Units (4 Units Intravenous Given 04/06/13 1619)  potassium chloride SA (K-DUR,KLOR-CON) CR tablet 40 mEq (40 mEq Oral Given 04/06/13 1652)   Procedures (including critical care time)  DIAGNOSTIC STUDIES: Oxygen Saturation is 96% on RA, normal by my interpretation.    COORDINATION OF CARE:  3:31 PM- Pt advised of plan for diagnostic lab work and radiology, along with plan to receive IV fluids and Insulin and pt agrees.  5:11 PM-Recheck with pt and she is now complaining chiefly of sinus congestion. Her ED blood sugar is down to 184 after receiving insulin. She states that she will inform her doctor of today's ED trip, and she feels ready for discharge. Pt not given a full bolus of NS b/o her history of cardiomyopathy and CHF.   Results for orders placed during the hospital encounter of 04/06/13  GLUCOSE, CAPILLARY      Result Value Range   Glucose-Capillary 315 (*) 70 - 99 mg/dL  CBC WITH DIFFERENTIAL      Result Value Range   WBC 6.8  4.0 - 10.5 K/uL   RBC 4.56  3.87 - 5.11 MIL/uL   Hemoglobin 13.0  12.0 - 15.0 g/dL   HCT 19.1  47.8 - 29.5 %   MCV 85.5  78.0 - 100.0 fL   MCH 28.5  26.0 - 34.0 pg   MCHC 33.3  30.0 - 36.0 g/dL   RDW 62.1  30.8 - 65.7 %   Platelets 282  150 - 400 K/uL   Neutrophils Relative % 51  43 - 77 %   Neutro Abs 3.5  1.7 - 7.7 K/uL   Lymphocytes Relative 41  12 - 46 %   Lymphs Abs 2.8  0.7 - 4.0 K/uL   Monocytes Relative 6  3 - 12 %   Monocytes Absolute 0.4  0.1 - 1.0 K/uL   Eosinophils Relative 2  0 - 5 %   Eosinophils Absolute 0.1  0.0 - 0.7 K/uL   Basophils Relative 0  0 - 1 %   Basophils Absolute 0.0  0.0 - 0.1 K/uL  COMPREHENSIVE METABOLIC PANEL      Result Value Range   Sodium 134 (*) 135 - 145 mEq/L   Potassium 3.0 (*) 3.5 - 5.1 mEq/L   Chloride 90 (*) 96 - 112 mEq/L   CO2 39 (*) 19 - 32 mEq/L   Glucose, Bld 275 (*) 70 - 99 mg/dL   BUN 11  6 - 23 mg/dL  Creatinine,  Ser 0.72  0.50 - 1.10 mg/dL   Calcium 8.8  8.4 - 95.6 mg/dL   Total Protein 8.4 (*) 6.0 - 8.3 g/dL   Albumin 3.3 (*) 3.5 - 5.2 g/dL   AST 11  0 - 37 U/L   ALT 8  0 - 35 U/L   Alkaline Phosphatase 83  39 - 117 U/L   Total Bilirubin 0.5  0.3 - 1.2 mg/dL   GFR calc non Af Amer >90  >90 mL/min   GFR calc Af Amer >90  >90 mL/min  PRO B NATRIURETIC PEPTIDE      Result Value Range   Pro B Natriuretic peptide (BNP) 612.1 (*) 0 - 125 pg/mL  TROPONIN I      Result Value Range   Troponin I <0.30  <0.30 ng/mL  GLUCOSE, CAPILLARY      Result Value Range   Glucose-Capillary 186 (*) 70 - 99 mg/dL   Laboratory interpretation all normal except hypokalemia, hyperglycemia, mildly elevated BNP   Dg Chest 2 View  04/06/2013   *RADIOLOGY REPORT*  Clinical Data: Hyperglycemia  CHEST - 2 VIEW  Comparison: November 19, 2012  Findings: There is generalized cardiac enlargement.  Pacemaker leads are attached to the right atrium, right ventricle, and left ventricle.  Pulmonary vascularity is normal.  No adenopathy.  No edema or consolidation.  IMPRESSION: Lungs clear.  Stable cardiac enlargement.   Original Report Authenticated By: Bretta Bang, M.D.   Dg Foot Complete Right  04/06/2013   *RADIOLOGY REPORT*  Clinical Data: Pain and swelling  RIGHT FOOT COMPLETE - 3+ VIEW  Comparison: None.  Findings: Frontal, oblique, and lateral views were obtained.  No fracture or dislocation.  Joint spaces appear intact.  No erosive change.  There is a spur arising from the posterior calcaneus.  IMPRESSION: Posterior calcaneal spur.  No appreciable arthropathy.  No fracture or dislocation.   Original Report Authenticated By: Bretta Bang, M.D.     Date: 04/06/2013  Rate: 91  Rhythm: atrial sensed ventricular pacemaker   Old EKG Reviewed: unchanged from 11/19/2012   1. Hypokalemia with normal acid-base balance   2. Hyperglycemia   3. Sinusitis    New Prescriptions   AMOXICILLIN (AMOXIL) 500 MG CAPSULE    Take 1  capsule (500 mg total) by mouth 3 (three) times daily.   POTASSIUM CHLORIDE 20 MEQ TBCR    Take 20 mEq by mouth 2 (two) times daily.    Plan discharge  Devoria Albe, MD, FACEP   MDM    I personally performed the services described in this documentation, which was scribed in my presence. The recorded information has been reviewed and considered.  Devoria Albe, MD, Armando Gang    Ward Givens, MD 04/06/13 [phone]

## 2013-04-06 NOTE — ED Notes (Signed)
States she has had URI symptoms over 1 week, states she is coughing, has a scratchy throat.  States her blood sugar has been high since last Sunday, now running 500 or more.   Also states that she has a "knot" on the top of her right foot.

## 2013-05-06 ENCOUNTER — Encounter (HOSPITAL_COMMUNITY): Payer: Self-pay | Admitting: Anesthesiology

## 2013-06-09 ENCOUNTER — Encounter (HOSPITAL_COMMUNITY): Payer: Self-pay | Admitting: *Deleted

## 2013-06-09 ENCOUNTER — Emergency Department (HOSPITAL_COMMUNITY): Payer: Medicare Other

## 2013-06-09 ENCOUNTER — Inpatient Hospital Stay (HOSPITAL_COMMUNITY)
Admission: EM | Admit: 2013-06-09 | Discharge: 2013-06-12 | DRG: 638 | Disposition: A | Discharge status: Home / Self Care | Payer: Medicare Other | Attending: Family Medicine | Admitting: Family Medicine

## 2013-06-09 DIAGNOSIS — R739 Hyperglycemia, unspecified: Secondary | ICD-10-CM

## 2013-06-09 DIAGNOSIS — G4733 Obstructive sleep apnea (adult) (pediatric): Secondary | ICD-10-CM | POA: Diagnosis present

## 2013-06-09 DIAGNOSIS — Z95810 Presence of automatic (implantable) cardiac defibrillator: Secondary | ICD-10-CM

## 2013-06-09 DIAGNOSIS — K5900 Constipation, unspecified: Secondary | ICD-10-CM

## 2013-06-09 DIAGNOSIS — Z9010 Acquired absence of unspecified breast and nipple: Secondary | ICD-10-CM

## 2013-06-09 DIAGNOSIS — I428 Other cardiomyopathies: Secondary | ICD-10-CM | POA: Diagnosis present

## 2013-06-09 DIAGNOSIS — G609 Hereditary and idiopathic neuropathy, unspecified: Secondary | ICD-10-CM | POA: Diagnosis present

## 2013-06-09 DIAGNOSIS — J40 Bronchitis, not specified as acute or chronic: Secondary | ICD-10-CM | POA: Diagnosis present

## 2013-06-09 DIAGNOSIS — Z23 Encounter for immunization: Secondary | ICD-10-CM

## 2013-06-09 DIAGNOSIS — Z6841 Body Mass Index (BMI) 40.0 and over, adult: Secondary | ICD-10-CM

## 2013-06-09 DIAGNOSIS — G629 Polyneuropathy, unspecified: Secondary | ICD-10-CM

## 2013-06-09 DIAGNOSIS — A4901 Methicillin susceptible Staphylococcus aureus infection, unspecified site: Secondary | ICD-10-CM | POA: Diagnosis present

## 2013-06-09 DIAGNOSIS — D638 Anemia in other chronic diseases classified elsewhere: Secondary | ICD-10-CM | POA: Diagnosis present

## 2013-06-09 DIAGNOSIS — I10 Essential (primary) hypertension: Secondary | ICD-10-CM | POA: Diagnosis present

## 2013-06-09 DIAGNOSIS — K625 Hemorrhage of anus and rectum: Secondary | ICD-10-CM

## 2013-06-09 DIAGNOSIS — M549 Dorsalgia, unspecified: Secondary | ICD-10-CM

## 2013-06-09 DIAGNOSIS — I509 Heart failure, unspecified: Secondary | ICD-10-CM

## 2013-06-09 DIAGNOSIS — E860 Dehydration: Secondary | ICD-10-CM

## 2013-06-09 DIAGNOSIS — I5022 Chronic systolic (congestive) heart failure: Secondary | ICD-10-CM | POA: Diagnosis present

## 2013-06-09 DIAGNOSIS — R7309 Other abnormal glucose: Secondary | ICD-10-CM

## 2013-06-09 DIAGNOSIS — Z9089 Acquired absence of other organs: Secondary | ICD-10-CM

## 2013-06-09 DIAGNOSIS — K589 Irritable bowel syndrome without diarrhea: Secondary | ICD-10-CM | POA: Diagnosis present

## 2013-06-09 DIAGNOSIS — J45909 Unspecified asthma, uncomplicated: Secondary | ICD-10-CM | POA: Diagnosis present

## 2013-06-09 DIAGNOSIS — Z8249 Family history of ischemic heart disease and other diseases of the circulatory system: Secondary | ICD-10-CM

## 2013-06-09 DIAGNOSIS — IMO0001 Reserved for inherently not codable concepts without codable children: Principal | ICD-10-CM | POA: Diagnosis present

## 2013-06-09 DIAGNOSIS — Z79899 Other long term (current) drug therapy: Secondary | ICD-10-CM

## 2013-06-09 DIAGNOSIS — E1129 Type 2 diabetes mellitus with other diabetic kidney complication: Secondary | ICD-10-CM

## 2013-06-09 DIAGNOSIS — K219 Gastro-esophageal reflux disease without esophagitis: Secondary | ICD-10-CM | POA: Diagnosis present

## 2013-06-09 DIAGNOSIS — R072 Precordial pain: Secondary | ICD-10-CM

## 2013-06-09 DIAGNOSIS — I340 Nonrheumatic mitral (valve) insufficiency: Secondary | ICD-10-CM

## 2013-06-09 DIAGNOSIS — D509 Iron deficiency anemia, unspecified: Secondary | ICD-10-CM

## 2013-06-09 DIAGNOSIS — E119 Type 2 diabetes mellitus without complications: Secondary | ICD-10-CM

## 2013-06-09 DIAGNOSIS — Z87891 Personal history of nicotine dependence: Secondary | ICD-10-CM

## 2013-06-09 DIAGNOSIS — Z8719 Personal history of other diseases of the digestive system: Secondary | ICD-10-CM

## 2013-06-09 HISTORY — DX: Type 2 diabetes mellitus without complications: E11.9

## 2013-06-09 LAB — COMPREHENSIVE METABOLIC PANEL
ALT: 9 U/L (ref 0–35)
AST: 13 U/L (ref 0–37)
BUN: 16 mg/dL (ref 6–23)
CO2: 34 mEq/L — ABNORMAL HIGH (ref 19–32)
Calcium: 9 mg/dL (ref 8.4–10.5)
Chloride: 81 mEq/L — ABNORMAL LOW (ref 96–112)
Creatinine, Ser: 0.82 mg/dL (ref 0.50–1.10)
GFR calc Af Amer: >90 mL/min (ref >90)
GFR calc non Af Amer: 84 mL/min — ABNORMAL LOW (ref >90)
Sodium: 124 mEq/L — ABNORMAL LOW (ref 135–145)
Total Protein: 8.3 g/dL (ref 6.0–8.3)

## 2013-06-09 LAB — URINALYSIS, ROUTINE W REFLEX MICROSCOPIC
Bilirubin Urine: NEGATIVE
Glucose, UA: >1000 mg/dL — AB
Hgb urine dipstick: NEGATIVE
Ketones, ur: NEGATIVE mg/dL
Leukocytes, UA: NEGATIVE
Nitrite: NEGATIVE
Specific Gravity, Urine: 1.005 (ref 1.005–1.030)
Urobilinogen, UA: 0.2 mg/dL (ref 0.0–1.0)
pH: 6 (ref 5.0–8.0)

## 2013-06-09 LAB — CBC WITH DIFFERENTIAL/PLATELET
Basophils Absolute: 0 10*3/uL (ref 0.0–0.1)
Eosinophils Absolute: 0.1 10*3/uL (ref 0.0–0.7)
Eosinophils Relative: 2 % (ref 0–5)
MCH: 29.3 pg (ref 26.0–34.0)
MCHC: 34.2 g/dL (ref 30.0–36.0)
MCV: 85.8 fL (ref 78.0–100.0)
Monocytes Absolute: 0.2 10*3/uL (ref 0.1–1.0)
Platelets: 234 10*3/uL (ref 150–400)
RDW: 12.6 % (ref 11.5–15.5)

## 2013-06-09 LAB — GLUCOSE, CAPILLARY
Glucose-Capillary: >600 mg/dL (ref 70–99)
Glucose-Capillary: >600 mg/dL (ref 70–99)

## 2013-06-09 LAB — URINE MICROSCOPIC-ADD ON

## 2013-06-09 MED ORDER — DEXTROSE 50 % IV SOLN: 25.0000 mL | INTRAVENOUS | Status: DC | PRN

## 2013-06-09 MED ORDER — SODIUM CHLORIDE 0.9 % IJ SOLN
3.0000 mL | Freq: Two times a day (BID) | INTRAMUSCULAR | Status: DC
  Administered 2013-06-09 – 2013-06-10 (×3): 3 mL via INTRAVENOUS

## 2013-06-09 MED ORDER — DOCUSATE SODIUM 100 MG PO CAPS
100.0000 mg | ORAL_CAPSULE | Freq: Every day | ORAL | Status: DC
  Administered 2013-06-10 – 2013-06-11 (×2): 100 mg via ORAL
  Filled 2013-06-09 (×2): qty 1

## 2013-06-09 MED ORDER — GABAPENTIN 300 MG PO CAPS
600.0000 mg | ORAL_CAPSULE | Freq: Two times a day (BID) | ORAL | Status: DC
  Administered 2013-06-09 – 2013-06-12 (×6): 600 mg via ORAL
  Filled 2013-06-09 (×7): qty 2

## 2013-06-09 MED ORDER — ACETAMINOPHEN 500 MG PO TABS
1000.0000 mg | ORAL_TABLET | Freq: Once | ORAL | Status: AC
  Administered 2013-06-09: 1000 mg via ORAL

## 2013-06-09 MED ORDER — ENOXAPARIN SODIUM 40 MG/0.4ML ~~LOC~~ SOLN
40.0000 mg | SUBCUTANEOUS | Status: DC
  Administered 2013-06-10: 40 mg via SUBCUTANEOUS
  Filled 2013-06-09: qty 0.4

## 2013-06-09 MED ORDER — HYDRALAZINE HCL 25 MG PO TABS
100.0000 mg | ORAL_TABLET | Freq: Three times a day (TID) | ORAL | Status: DC
  Administered 2013-06-09 – 2013-06-12 (×6): 100 mg via ORAL
  Filled 2013-06-09 (×8): qty 4

## 2013-06-09 MED ORDER — GABAPENTIN 300 MG PO CAPS
300.0000 mg | ORAL_CAPSULE | Freq: Every day | ORAL | Status: DC
  Administered 2013-06-10 – 2013-06-11 (×2): 300 mg via ORAL
  Filled 2013-06-09 (×3): qty 1

## 2013-06-09 MED ORDER — SODIUM CHLORIDE 0.9 % IV SOLN
INTRAVENOUS | Status: DC
  Administered 2013-06-09: 5 [IU]/h via INTRAVENOUS
  Administered 2013-06-10: 7.7 [IU]/h via INTRAVENOUS
  Filled 2013-06-09: qty 1

## 2013-06-09 MED ORDER — ACETAMINOPHEN 325 MG PO TABS
650.0000 mg | ORAL_TABLET | Freq: Four times a day (QID) | ORAL | Status: DC | PRN
  Administered 2013-06-11: 650 mg via ORAL
  Filled 2013-06-09: qty 2

## 2013-06-09 MED ORDER — ISOSORBIDE MONONITRATE ER 60 MG PO TB24
60.0000 mg | ORAL_TABLET | Freq: Three times a day (TID) | ORAL | Status: DC
  Administered 2013-06-09 – 2013-06-12 (×7): 60 mg via ORAL
  Filled 2013-06-09 (×7): qty 1

## 2013-06-09 MED ORDER — TORSEMIDE 20 MG PO TABS
40.0000 mg | ORAL_TABLET | Freq: Every day | ORAL | Status: DC
  Administered 2013-06-10 – 2013-06-11 (×2): 40 mg via ORAL
  Filled 2013-06-09 (×2): qty 2

## 2013-06-09 MED ORDER — SODIUM CHLORIDE 0.9 % IV SOLN: 250.0000 mL | INTRAVENOUS | Status: DC | PRN

## 2013-06-09 MED ORDER — ACETAMINOPHEN 500 MG PO TABS
ORAL_TABLET | ORAL | Status: AC
  Administered 2013-06-09: 1000 mg via ORAL
  Filled 2013-06-09: qty 2

## 2013-06-09 MED ORDER — ALBUTEROL SULFATE (5 MG/ML) 0.5% IN NEBU
2.5000 mg | INHALATION_SOLUTION | RESPIRATORY_TRACT | Status: DC | PRN
  Administered 2013-06-10 (×2): 2.5 mg via RESPIRATORY_TRACT
  Filled 2013-06-09 (×2): qty 0.5

## 2013-06-09 MED ORDER — BENAZEPRIL HCL 10 MG PO TABS
40.0000 mg | ORAL_TABLET | Freq: Every day | ORAL | Status: DC
  Administered 2013-06-10 – 2013-06-11 (×2): 40 mg via ORAL
  Filled 2013-06-09 (×2): qty 4

## 2013-06-09 MED ORDER — CARVEDILOL 12.5 MG PO TABS
12.5000 mg | ORAL_TABLET | Freq: Two times a day (BID) | ORAL | Status: DC
  Administered 2013-06-09 – 2013-06-12 (×6): 12.5 mg via ORAL
  Filled 2013-06-09 (×6): qty 1

## 2013-06-09 MED ORDER — LORATADINE 10 MG PO TABS
10.0000 mg | ORAL_TABLET | Freq: Every day | ORAL | Status: DC
  Administered 2013-06-10 – 2013-06-11 (×2): 10 mg via ORAL
  Filled 2013-06-09 (×2): qty 1

## 2013-06-09 MED ORDER — POTASSIUM CHLORIDE ER 10 MEQ PO TBCR
20.0000 meq | EXTENDED_RELEASE_TABLET | Freq: Two times a day (BID) | ORAL | Status: DC
  Administered 2013-06-09: 20 meq via ORAL
  Filled 2013-06-09 (×6): qty 2

## 2013-06-09 MED ORDER — INSULIN REGULAR BOLUS VIA INFUSION
0.0000 [IU] | Freq: Three times a day (TID) | INTRAVENOUS | Status: DC
  Administered 2013-06-10: 4 [IU] via INTRAVENOUS
  Filled 2013-06-09: qty 10

## 2013-06-09 MED ORDER — INFLUENZA VAC SPLIT QUAD 0.5 ML IM SUSP
0.5000 mL | INTRAMUSCULAR | Status: AC
  Administered 2013-06-10: 0.5 mL via INTRAMUSCULAR
  Filled 2013-06-09: qty 0.5

## 2013-06-09 MED ORDER — FERROUS SULFATE 325 (65 FE) MG PO TABS
325.0000 mg | ORAL_TABLET | Freq: Three times a day (TID) | ORAL | Status: DC
  Administered 2013-06-10 – 2013-06-12 (×7): 325 mg via ORAL
  Filled 2013-06-09 (×8): qty 1

## 2013-06-09 MED ORDER — ACETAMINOPHEN 650 MG RE SUPP: 650.0000 mg | Freq: Four times a day (QID) | RECTAL | Status: DC | PRN

## 2013-06-09 MED ORDER — SODIUM CHLORIDE 0.9 % IJ SOLN: 3.0000 mL | INTRAMUSCULAR | Status: DC | PRN

## 2013-06-09 MED ORDER — SODIUM CHLORIDE 0.9 % IV SOLN
INTRAVENOUS | Status: DC
  Administered 2013-06-09: 23:00:00 via INTRAVENOUS
  Administered 2013-06-10: 1 mL via INTRAVENOUS
  Administered 2013-06-11: via INTRAVENOUS

## 2013-06-09 NOTE — ED Provider Notes (Signed)
CSN: [id number]     Arrival date & time 06/09/13  1751 History   First MD Initiated Contact with Patient 06/09/13 1901     Chief Complaint  Patient presents with  . Hyperglycemia    Patient is a 47 y.o. female presenting with weakness. The history is provided by the patient.  Weakness This is a new problem. The current episode started more than 1 week ago. The problem occurs daily. The problem has been gradually worsening. Associated symptoms include headaches and shortness of breath. Pertinent negatives include no chest pain. Nothing aggravates the symptoms. Nothing relieves the symptoms.   Pt presents for increasing fatigue and hyperglycemia She reports her glucose readings have been high for several weeks despite medication compliance She also reports mild SOB and HA No focal weakness, just fatigue No active chest pain is reported (she told nurse she had CP, but denied on my evaluation  Past Medical History  Diagnosis Date  . CHF (congestive heart failure)     chronic systolic CHF,  . Nonischemic cardiomyopathy     EF 25%  . Mitral regurgitation     moderate to severe  . S/P cardiac catheterization 10/06    normal coronary arteries  . LBBB (left bundle branch block)   . HTN (hypertension)   . Asthma   . IBS (irritable bowel syndrome)     with primarily constipation  . Morbid obesity   . GERD (gastroesophageal reflux disease)   . IDA (iron deficiency anemia)     2o TO SB AVMS, Hx parenteral iron Dr Mariel Sleet  . AVM (arteriovenous malformation)     Small bowel, s/p duble balloon enteroscopy/APC jejunum Dr Gwinda Passe [facility]) 01/23/2011  . Anemia, chronic disease   . Peripheral neuropathy   . OSA (obstructive sleep apnea)     on CPAP qhs  . GI bleed 06/2009    Hgb 7.5, ferritin 7, transfusion, iron IV  . Dyspnea on exertion     chronic  . Diabetes mellitus without complication    Past Surgical History  Procedure Laterality Date  . Crdt-implantation  4/07    [employer]. remote- yes  . Appendectomy    . Cholecystectomy      biliary dyskinesia  . Mastectomy  2003    left, partial  . Knee arthroscopy  2005    left  . Small bowel enteroscopy  MAY 2012 DBE [facility] DR. GILLIAM    SB AVMS s/p APC  . Colonoscopy  OCT 2010/SEP 2011    tortuous colon, 3 polyps-benign(2010),   . Upper gastrointestinal endoscopy  '08, '10, SEP 2011    mild antral gastritis (2010), negative SB bx (2010), incomlpete Schatzki's ring (2011)  . Small bowel enteroscopy  SEP 2011 [facility]    Fields-NO AVMS  . Givens capsule study  NOV 2010     NO AVMS, normal  . Irrigation and debridement abscess  06/25/2012    Procedure: MINOR INCISION AND DRAINAGE OF ABSCESS;  Surgeon: [name], MD;  Location: [facility];  Service: General;  Laterality: [name];  Incision & Drainage of Infected Sebaceous Cyst on Chest  . Breast surgery    . Cardiac defibrillator placement     Family History  Problem Relation Age of Onset  . Coronary artery disease      premature CAD- no family Hx   . Colon cancer Neg Hx     no family Hx of polyps too  . Cervical cancer Mother   . Hypertension  Mother   . Heart disease Father   . Hypertension Father   . GER disease Father    History  Substance Use Topics  . Smoking status: Former Smoker -- 0.50 packs/day for 20 years    Types: Cigarettes    Quit date: 11/19/2009  . Smokeless tobacco: Not on file  . Alcohol Use: No   OB History   Grav Para Term Preterm Abortions TAB SAB Ect Mult Living                 Review of Systems  Constitutional: Negative for fever.  Respiratory: Positive for shortness of breath.   Cardiovascular: Negative for chest pain.  Gastrointestinal: Positive for nausea.  Endocrine: Positive for polydipsia and polyuria.  Neurological: Positive for weakness and headaches.  All other systems reviewed and are negative.    Allergies  Review of patient's allergies indicates no known allergies.  Home Medications    Current Outpatient Rx  Name  Route  Sig  Dispense  Refill  . albuterol (PROVENTIL) (5 MG/ML) 0.5% nebulizer solution   Nebulization   Take 0.5 mLs (2.5 mg total) by nebulization every 4 (four) hours as needed for wheezing or shortness of breath.   20 mL   11   . albuterol (VENTOLIN HFA) 108 (90 BASE) MCG/ACT inhaler   Inhalation   Inhale 2 puffs into the lungs every 6 (six) hours as needed. For shortness of breath         . amoxicillin (AMOXIL) 500 MG capsule   Oral   Take 1 capsule (500 mg total) by mouth 3 (three) times daily.   30 capsule   0   . benazepril (LOTENSIN) 40 MG tablet   Oral   Take 40 mg by mouth daily.          . carvedilol (COREG) 25 MG tablet   Oral   Take 0.5 tablets (12.5 mg total) by mouth 2 (two) times daily.   30 tablet   5   . cetirizine (ZYRTEC) 10 MG tablet   Oral   Take 10 mg by mouth daily.         . cyclobenzaprine (FLEXERIL) 10 MG tablet   Oral   Take 10 mg by mouth 3 (three) times daily as needed. For muscle spasms         . docusate sodium (STOOL SOFTENER) 100 MG capsule   Oral   Take 100 mg by mouth daily.          . Etonogestrel (IMPLANON [place])   Subcutaneous   Inject 1 each into the skin once. Gets every 3 years         . ferrous sulfate 325 (65 FE) MG tablet   Oral   Take 325 mg by mouth 3 (three) times daily.         Marland Kitchen gabapentin (NEURONTIN) 300 MG capsule   Oral   Take 300-600 mg by mouth 2 (two) times daily. 2 capsules in the morning, 1 at noon, and 2 at bedtime         . hydrALAZINE (APRESOLINE) 100 MG tablet   Oral   Take 100 mg by mouth 3 (three) times daily. For heart failure         . insulin aspart (NOVOLOG) 100 UNIT/ML injection   Subcutaneous   Inject 0-20 Units into the skin 3 (three) times daily with meals.   1 vial   5   . insulin detemir (LEVEMIR) 100 UNIT/ML injection  Subcutaneous   Inject 80 Units into the skin at bedtime.         . isosorbide mononitrate (IMDUR) 60 MG 24 hr  tablet   Oral   Take 60 mg by mouth 3 (three) times daily. For heart failure         . omeprazole (PRILOSEC) 40 MG capsule   Oral   Take 1 capsule (40 mg total) by mouth daily.   30 capsule   6   . potassium chloride 20 MEQ TBCR   Oral   Take 20 mEq by mouth 2 (two) times daily.   8 tablet   0   . potassium chloride SA (K-DUR,KLOR-CON) 20 MEQ tablet   Oral   Take 20 mEq by mouth 5 (five) times daily.          Marland Kitchen torsemide (DEMADEX) 20 MG tablet   Oral   Take 2 tablets (40 mg total) by mouth daily. For heart failure   60 tablet   11   . vitamin E 400 UNIT capsule   Oral   Take 400 Units by mouth daily.          BP 137/97  Pulse 100  Temp(Src) 98.6 F (37 C) (Oral)  Resp 18  Ht 5\' 6"  (1.676 m)  Wt 275 lb (124.739 kg)  BMI 44.41 kg/m2  SpO2 96% Physical Exam CONSTITUTIONAL: Well developed/well nourished HEAD: Normocephalic/atraumatic EYES: EOMI/PERRL ENMT: Mucous membranes moist NECK: supple no meningeal signs CV: S1/S2 noted LUNGS:decreased breath sounds noted bilaterally.  No distress noted ABDOMEN: soft, nontender, no rebound or guarding GU:no cva tenderness NEURO: Pt is awake/alert, moves all extremitiesx4 EXTREMITIES: pulses normal, full ROM SKIN: warm, color normal PSYCH: no abnormalities of mood noted  ED Course  Procedures Labs Review Labs Reviewed  COMPREHENSIVE METABOLIC PANEL - Abnormal; Notable for the following:    Sodium 124 (*)    Chloride 81 (*)    CO2 34 (*)    Glucose, Bld 731 (*)    Albumin 3.4 (*)    GFR calc non Af Amer 84 (*)    All other components within normal limits  GLUCOSE, CAPILLARY - Abnormal; Notable for the following:    Glucose-Capillary >600 (*)    All other components within normal limits  GLUCOSE, CAPILLARY - Abnormal; Notable for the following:    Glucose-Capillary >600 (*)    All other components within normal limits  CBC WITH DIFFERENTIAL   Imaging Review Dg Chest Port 1 View  06/09/2013   *RADIOLOGY  REPORT*  Clinical Data: Cough, shortness of breath, history of cardiomyopathy  PORTABLE CHEST - 1 VIEW  Comparison: The 04/06/2013  Findings: Moderate cardiac enlargement stable.  Cardiac pacer in unchanged position.  Moderate vascular congestion.  Mild peribronchial cuffing.  IMPRESSION: Fluid overload with no definite pulmonary edema currently.   Original Report Authenticated By: Esperanza Heir, M.D.    Pt found to have hyperglycemia with dehydration (no anion gap) Early CHF possible after review of CXR Will admit for treatment D/w dr Sharl Ma, will admit Pt agreeable with plan Insulin has been started in the ED  MDM   1. CHF (congestive heart failure)   2. Hyperglycemia   3. Dehydration    Nursing notes including past medical history and social history reviewed and considered in documentation xrays reviewed and considered Labs/vital reviewed and considered    Date: 06/09/2013  Rate: 99  Rhythm: indeterminate  QRS Axis: right  Intervals: normal  ST/T Wave abnormalities: nonspecific ST  changes  Conduction Disutrbances:ventricular paced  Narrative Interpretation:   Old EKG Reviewed: unchanged from prior    [name], MD 06/09/13 2204

## 2013-06-09 NOTE — ED Notes (Signed)
Pt came in POV and via wheelchair to the room. Pt presents with shortness of breath, excessive thirst, excessive urination and high blood sugar readings at home for the last 3 weeks. Pt reports her sugars at home have been too high for her meter to read. Saw PCP about 3 weeks ago and was treated for upper respiratory infection and PCP said that once the URI cleared up, her sugars would stabilize. Pt reports some chest pain that started today. Pt is A&O x4, in no apparent distress.

## 2013-06-09 NOTE — ED Notes (Signed)
Hyperglycemia for 3 weeks.  Reading Hi  At home.

## 2013-06-09 NOTE — H&P (Signed)
PCP:   [name], MD   Chief Complaint:  High blood sugar  HPI: 47 year old female with a history of CHF, nonischemic myopathy, hypertension, diabetes mellitus status post ICD who came to the ED with chief complaint of elevated blood sugars for past few days. As per patient she was seen in the ED few days ago when she was treated with antibiotics and was sent home and since that time patient says that her blood sugar has been out of control. She is seen by Dr. [name], patient takes Levimer  and NovoLog at home, patient says that her sugar usually runs very good. She also complains of reduced appetite, nausea mild shortness of breath no chest pain. No vomiting or diarrhea no fever. In the ED patient was found to have blood glucose of 761, with normal anion gap.  Allergies:  No Known Allergies    Past Medical History  Diagnosis Date  . CHF (congestive heart failure)     chronic systolic CHF,  . Nonischemic cardiomyopathy     EF 25%  . Mitral regurgitation     moderate to severe  . S/P cardiac catheterization 10/06    normal coronary arteries  . LBBB (left bundle branch block)   . HTN (hypertension)   . Asthma   . IBS (irritable bowel syndrome)     with primarily constipation  . Morbid obesity   . GERD (gastroesophageal reflux disease)   . IDA (iron deficiency anemia)     2o TO SB AVMS, Hx parenteral iron Dr Mariel Sleet  . AVM (arteriovenous malformation)     Small bowel, s/p duble balloon enteroscopy/APC jejunum Dr Gwinda Passe [facility]) 01/23/2011  . Anemia, chronic disease   . Peripheral neuropathy   . OSA (obstructive sleep apnea)     on CPAP qhs  . GI bleed 06/2009    Hgb 7.5, ferritin 7, transfusion, iron IV  . Dyspnea on exertion     chronic  . Diabetes mellitus without complication     Past Surgical History  Procedure Laterality Date  . Crdt-implantation  4/07    [employer]. remote- yes  . Appendectomy    . Cholecystectomy      biliary dyskinesia  .  Mastectomy  2003    left, partial  . Knee arthroscopy  2005    left  . Small bowel enteroscopy  MAY 2012 [facility] DR. GILLIAM    SB AVMS s/p APC  . Colonoscopy  OCT 2010/SEP 2011    tortuous colon, 3 polyps-benign(2010),   . Upper gastrointestinal endoscopy  '08, '10, SEP 2011    mild antral gastritis (2010), negative SB bx (2010), incomlpete Schatzki's ring (2011)  . Small bowel enteroscopy  SEP 2011 [facility]    Fields-NO AVMS  . Givens capsule study  NOV 2010     NO AVMS, normal  . Irrigation and debridement abscess  06/25/2012    Procedure: MINOR INCISION AND DRAINAGE OF ABSCESS;  Surgeon: [name], MD;  Location: [facility] ORS;  Service: General;  Laterality: [name];  Incision & Drainage of Infected Sebaceous Cyst on Chest  . Breast surgery    . Cardiac defibrillator placement      Prior to Admission medications   Medication Sig Start Date End Date Taking? Authorizing Provider  albuterol (PROVENTIL) (5 MG/ML) 0.5% nebulizer solution Take 0.5 mLs (2.5 mg total) by nebulization every 4 (four) hours as needed for wheezing or shortness of breath. 11/21/12  Yes [name], MD  albuterol (  VENTOLIN HFA) 108 (90 BASE) MCG/ACT inhaler Inhale 2 puffs into the lungs every 6 (six) hours as needed. For shortness of breath   Yes Historical Provider, MD  benazepril (LOTENSIN) 40 MG tablet Take 40 mg by mouth daily.    Yes Historical Provider, MD  carvedilol (COREG) 25 MG tablet Take 0.5 tablets (12.5 mg total) by mouth 2 (two) times daily. 03/27/13  Yes Marinus Maw, MD  cetirizine (ZYRTEC) 10 MG tablet Take 10 mg by mouth daily as needed for allergies.    Yes Historical Provider, MD  cyclobenzaprine (FLEXERIL) 10 MG tablet Take 10 mg by mouth 3 (three) times daily as needed. For muscle spasms   Yes Historical Provider, MD  docusate sodium (STOOL SOFTENER) 100 MG capsule Take 100 mg by mouth daily.    Yes Historical Provider, MD  Etonogestrel (IMPLANON [place]) Inject 1 each into the skin once.  Gets every 3 years   Yes Historical Provider, MD  ferrous sulfate 325 (65 FE) MG tablet Take 325 mg by mouth 3 (three) times daily.   Yes Historical Provider, MD  gabapentin (NEURONTIN) 300 MG capsule Take 300-600 mg by mouth 2 (two) times daily. 2 capsules in the morning, 1 at noon, and 2 at bedtime   Yes Historical Provider, MD  hydrALAZINE (APRESOLINE) 100 MG tablet Take 100 mg by mouth 3 (three) times daily. For heart failure   Yes Historical Provider, MD  insulin aspart (NOVOLOG) 100 UNIT/ML injection Inject 0-20 Units into the skin 3 (three) times daily with meals. 06/28/12  Yes [name], MD  insulin detemir (LEVEMIR) 100 UNIT/ML injection Inject 80 Units into the skin at bedtime.   Yes Historical Provider, MD  isosorbide mononitrate (IMDUR) 60 MG 24 hr tablet Take 60 mg by mouth 3 (three) times daily. For heart failure   Yes Historical Provider, MD  omeprazole (PRILOSEC) 40 MG capsule Take 1 capsule (40 mg total) by mouth daily. 03/27/13  Yes Marinus Maw, MD  potassium chloride SA (K-DUR,KLOR-CON) 20 MEQ tablet Take 20 mEq by mouth 5 (five) times daily.    Yes Historical Provider, MD  torsemide (DEMADEX) 20 MG tablet Take 2 tablets (40 mg total) by mouth daily. For heart failure 03/25/13  Yes Gaylord Shih, MD  vitamin E 400 UNIT capsule Take 400 Units by mouth daily.   Yes Historical Provider, MD    Social History:  reports that she quit smoking about 3 years ago. Her smoking use included Cigarettes. She has a 10 pack-year smoking history. She does not have any smokeless tobacco history on file. She reports that she does not drink alcohol or use illicit drugs.  Family History  Problem Relation Age of Onset  . Coronary artery disease      premature CAD- no family Hx   . Colon cancer Neg Hx     no family Hx of polyps too  . Cervical cancer Mother   . Hypertension Mother   . Heart disease Father   . Hypertension Father   . GER disease Father      All the positives are  listed in BOLD  Review of Systems:  HEENT: Headache, blurred vision, runny nose, sore throat Neck: Hypothyroidism, hyperthyroidism,,lymphadenopathy Chest : Shortness of breath, history of COPD, Asthma Heart : Chest pain, history of coronary arterey disease GI:  Nausea, vomiting, diarrhea, constipation, GERD GU: Dysuria, urgency, frequency of urination, hematuria Neuro: Stroke, seizures, syncope Psych: Depression, anxiety, hallucinations   Physical Exam: Blood pressure  137/97, pulse 100, temperature 98.6 F (37 C), temperature source Oral, resp. rate 18, height 5\' 6"  (1.676 m), weight 124.739 kg (275 lb), SpO2 96.00%. Constitutional:   Patient is a well-developed and well-nourished female* in no acute distress and cooperative with exam. Head: Normocephalic and atraumatic Mouth: Mucus membranes moist Eyes: PERRL, EOMI, conjunctivae normal Neck: Supple, No Thyromegaly Cardiovascular: RRR, S1 normal, S2 normal Pulmonary/Chest: CTAB, no wheezes, rales, or rhonchi Abdominal: Soft. Non-tender, non-distended, bowel sounds are normal, no masses, organomegaly, or guarding present.  Neurological: A&O x3, Strenght is normal and symmetric bilaterally, cranial nerve II-XII are grossly intact, no focal motor deficit, sensory intact to light touch bilaterally.  Extremities : No Cyanosis, Clubbing or Edema   Labs on Admission:  Results for orders placed during the hospital encounter of 06/09/13 (from the past 48 hour(s))  GLUCOSE, CAPILLARY     Status: Abnormal   Collection Time    06/09/13  6:11 PM      Result Value Range   Glucose-Capillary >600 (*) 70 - 99 mg/dL  CBC WITH DIFFERENTIAL     Status: None   Collection Time    06/09/13  7:20 PM      Result Value Range   WBC 5.7  4.0 - 10.5 K/uL   RBC 4.50  3.87 - 5.11 MIL/uL   Hemoglobin 13.2  12.0 - 15.0 g/dL   HCT 16.1  09.6 - 04.5 %   MCV 85.8  78.0 - 100.0 fL   MCH 29.3  26.0 - 34.0 pg   MCHC 34.2  30.0 - 36.0 g/dL   RDW 40.9  81.1 -  91.4 %   Platelets 234  150 - 400 K/uL   Neutrophils Relative % 58  43 - 77 %   Neutro Abs 3.3  1.7 - 7.7 K/uL   Lymphocytes Relative 36  12 - 46 %   Lymphs Abs 2.1  0.7 - 4.0 K/uL   Monocytes Relative 4  3 - 12 %   Monocytes Absolute 0.2  0.1 - 1.0 K/uL   Eosinophils Relative 2  0 - 5 %   Eosinophils Absolute 0.1  0.0 - 0.7 K/uL   Basophils Relative 0  0 - 1 %   Basophils Absolute 0.0  0.0 - 0.1 K/uL  COMPREHENSIVE METABOLIC PANEL     Status: Abnormal   Collection Time    06/09/13  7:20 PM      Result Value Range   Sodium 124 (*) 135 - 145 mEq/L   Potassium 3.5  3.5 - 5.1 mEq/L   Chloride 81 (*) 96 - 112 mEq/L   CO2 34 (*) 19 - 32 mEq/L   Glucose, Bld 731 (*) 70 - 99 mg/dL   Comment: CRITICAL RESULT CALLED TO, READ BACK BY AND VERIFIED WITH:     R. HAYEMORE AT 2122 ON 06/09/13 BY S. VANHOORNE   BUN 16  6 - 23 mg/dL   Creatinine, Ser 7.82  0.50 - 1.10 mg/dL   Calcium 9.0  8.4 - 95.6 mg/dL   Total Protein 8.3  6.0 - 8.3 g/dL   Albumin 3.4 (*) 3.5 - 5.2 g/dL   AST 13  0 - 37 U/L   ALT 9  0 - 35 U/L   Alkaline Phosphatase 109  39 - 117 U/L   Total Bilirubin 0.5  0.3 - 1.2 mg/dL   GFR calc non Af Amer 84 (*) >90 mL/min   GFR calc Af Amer >90  >90 mL/min  Comment: (NOTE)     The eGFR has been calculated using the CKD EPI equation.     This calculation has not been validated in all clinical situations.     eGFR's persistently <90 mL/min signify possible Chronic Kidney     Disease.  URINALYSIS, ROUTINE W REFLEX MICROSCOPIC     Status: Abnormal   Collection Time    06/09/13  7:20 PM      Result Value Range   Color, Urine YELLOW  YELLOW   APPearance CLEAR  CLEAR   Specific Gravity, Urine 1.005  1.005 - 1.030   pH 6.0  5.0 - 8.0   Glucose, UA >1000 (*) NEGATIVE mg/dL   Hgb urine dipstick NEGATIVE  NEGATIVE   Bilirubin Urine NEGATIVE  NEGATIVE   Ketones, ur NEGATIVE  NEGATIVE mg/dL   Protein, ur NEGATIVE  NEGATIVE mg/dL   Urobilinogen, UA 0.2  0.0 - 1.0 mg/dL   Nitrite  NEGATIVE  NEGATIVE   Leukocytes, UA NEGATIVE  NEGATIVE  PREGNANCY, URINE     Status: None   Collection Time    06/09/13  7:20 PM      Result Value Range   Preg Test, Ur NEGATIVE  NEGATIVE   Comment:            THE SENSITIVITY OF THIS     METHODOLOGY IS >20 mIU/mL.  URINE MICROSCOPIC-ADD ON     Status: None   Collection Time    06/09/13  7:20 PM      Result Value Range   Squamous Epithelial / LPF RARE  RARE   WBC, UA 0-2  <3 WBC/hpf   RBC / HPF 0-2  <3 RBC/hpf   Bacteria, UA RARE  RARE  GLUCOSE, CAPILLARY     Status: Abnormal   Collection Time    06/09/13  9:05 PM      Result Value Range   Glucose-Capillary >600 (*) 70 - 99 mg/dL   Comment 1 Documented in Chart      Radiological Exams on Admission: Dg Chest Port 1 View  06/09/2013   *RADIOLOGY REPORT*  Clinical Data: Cough, shortness of breath, history of cardiomyopathy  PORTABLE CHEST - 1 VIEW  Comparison: The 04/06/2013  Findings: Moderate cardiac enlargement stable.  Cardiac pacer in unchanged position.  Moderate vascular congestion.  Mild peribronchial cuffing.  IMPRESSION: Fluid overload with no definite pulmonary edema currently.   Original Report Authenticated By: Esperanza Heir, M.D.    Assessment/Plan Active Problems:   SYSTOLIC HEART FAILURE, CHRONIC   Hyperglycemia without ketosis   Diabetes mellitus  pseudohyponatremia  Hyperglycemia without ketosis Patient has elevated blood glucose, will start the patient on glucose stabilized with IV insulin, will check glucose every 2 hours. Will repeat BMP in 4 hours. Patient will  be started on IV fluids, once blood glucose is stable will saline lock her as she has a history of CHF  Chronic systolic heart failure Continue Demadex  Pseudohyponatremia Secondary to hyperglycemia Corrected sodium is 130.6  Nonischemic cardiopathy Continue imdur, hydralazine, Coreg, benazepril  Hypertension Will continue Coreg, hydralazine  DVT prophylaxis Lovenox  Code status:  Full code    Time Spent on Admission: 75 min  [place] [facility] Pager: [phone] 06/09/2013, 10:07 PM  If 7PM-7AM, please contact night-coverage  [email or web address]  Password [email or web address]

## 2013-06-10 LAB — COMPREHENSIVE METABOLIC PANEL
ALT: 8 U/L (ref 0–35)
Albumin: 3.1 g/dL — ABNORMAL LOW (ref 3.5–5.2)
Alkaline Phosphatase: 99 U/L (ref 39–117)
BUN: 11 mg/dL (ref 6–23)
CO2: 36 mEq/L — ABNORMAL HIGH (ref 19–32)
Calcium: 8.7 mg/dL (ref 8.4–10.5)
GFR calc Af Amer: >90 mL/min (ref >90)
GFR calc non Af Amer: >90 mL/min (ref >90)
Glucose, Bld: 189 mg/dL — ABNORMAL HIGH (ref 70–99)
Potassium: 2.7 mEq/L — CL (ref 3.5–5.1)
Sodium: 137 mEq/L (ref 135–145)
Total Protein: 7.9 g/dL (ref 6.0–8.3)

## 2013-06-10 LAB — GLUCOSE, CAPILLARY
Glucose-Capillary: 152 mg/dL — ABNORMAL HIGH (ref 70–99)
Glucose-Capillary: 152 mg/dL — ABNORMAL HIGH (ref 70–99)
Glucose-Capillary: 153 mg/dL — ABNORMAL HIGH (ref 70–99)
Glucose-Capillary: 195 mg/dL — ABNORMAL HIGH (ref 70–99)
Glucose-Capillary: 228 mg/dL — ABNORMAL HIGH (ref 70–99)
Glucose-Capillary: 272 mg/dL — ABNORMAL HIGH (ref 70–99)
Glucose-Capillary: 276 mg/dL — ABNORMAL HIGH (ref 70–99)
Glucose-Capillary: 326 mg/dL — ABNORMAL HIGH (ref 70–99)
Glucose-Capillary: 330 mg/dL — ABNORMAL HIGH (ref 70–99)
Glucose-Capillary: 452 mg/dL — ABNORMAL HIGH (ref 70–99)

## 2013-06-10 LAB — CBC
HCT: 38.3 % (ref 36.0–46.0)
Hemoglobin: 13.2 g/dL (ref 12.0–15.0)
MCH: 29.4 pg (ref 26.0–34.0)
MCHC: 34.5 g/dL (ref 30.0–36.0)
MCV: 85.3 fL (ref 78.0–100.0)
Platelets: 233 10*3/uL (ref 150–400)
RBC: 4.49 MIL/uL (ref 3.87–5.11)
RDW: 12.9 % (ref 11.5–15.5)

## 2013-06-10 LAB — EXPECTORATED SPUTUM ASSESSMENT W GRAM STAIN, RFLX TO RESP C

## 2013-06-10 LAB — EXPECTORATED SPUTUM ASSESSMENT W REFEX TO RESP CULTURE

## 2013-06-10 MED ORDER — INSULIN ASPART 100 UNIT/ML ~~LOC~~ SOLN
0.0000 [IU] | Freq: Every day | SUBCUTANEOUS | Status: DC
  Administered 2013-06-10 – 2013-06-11 (×2): 4 [IU] via SUBCUTANEOUS

## 2013-06-10 MED ORDER — INSULIN ASPART 100 UNIT/ML ~~LOC~~ SOLN
0.0000 [IU] | Freq: Three times a day (TID) | SUBCUTANEOUS | Status: DC
Start: 2013-06-10 — End: 2013-06-12
  Administered 2013-06-10 – 2013-06-11 (×2): 5 [IU] via SUBCUTANEOUS
  Administered 2013-06-11: 7 [IU] via SUBCUTANEOUS
  Administered 2013-06-11: 9 [IU] via SUBCUTANEOUS
  Administered 2013-06-12: 5 [IU] via SUBCUTANEOUS

## 2013-06-10 MED ORDER — INSULIN DETEMIR 100 UNIT/ML ~~LOC~~ SOLN
80.0000 [IU] | Freq: Every day | SUBCUTANEOUS | Status: DC
  Administered 2013-06-10 – 2013-06-11 (×2): 80 [IU] via SUBCUTANEOUS
  Filled 2013-06-10 (×3): qty 0.8

## 2013-06-10 MED ORDER — POTASSIUM CHLORIDE CRYS ER 20 MEQ PO TBCR
40.0000 meq | EXTENDED_RELEASE_TABLET | ORAL | Status: AC
  Administered 2013-06-10: 40 meq via ORAL
  Filled 2013-06-10 (×2): qty 2

## 2013-06-10 MED ORDER — INSULIN DETEMIR 100 UNIT/ML ~~LOC~~ SOLN: 80.0000 [IU] | Freq: Every day | SUBCUTANEOUS | Status: DC

## 2013-06-10 MED ORDER — PROMETHAZINE HCL 12.5 MG PO TABS
25.0000 mg | ORAL_TABLET | Freq: Four times a day (QID) | ORAL | Status: DC | PRN
  Administered 2013-06-10: 25 mg via ORAL
  Filled 2013-06-10: qty 2

## 2013-06-10 MED ORDER — ENOXAPARIN SODIUM 60 MG/0.6ML ~~LOC~~ SOLN
60.0000 mg | SUBCUTANEOUS | Status: DC
  Administered 2013-06-11 – 2013-06-12 (×2): 60 mg via SUBCUTANEOUS
  Filled 2013-06-10 (×2): qty 0.6

## 2013-06-10 MED ORDER — DEXTROSE 5 % IV SOLN
1.0000 g | INTRAVENOUS | Status: DC
  Administered 2013-06-10: 1 g via INTRAVENOUS
  Filled 2013-06-10 (×2): qty 10

## 2013-06-10 NOTE — Care Management Note (Addendum)
    Page 1 of 1   06/12/2013     10:44:51 AM   CARE MANAGEMENT NOTE 06/12/2013  Patient:  [name], [name]   Account Number:  [id number]  Date Initiated:  06/10/2013  Documentation initiated by:  Sharrie Rothman  Subjective/Objective Assessment:   Pt admitted from home with hyperglycemia. Pt lives with her fiance and daughter and will return home at discharge. Pt has a CAP aide 1.5 hours a day, M-F. Pt has shower chair, walker, cpap, and glucometer for home use.     Action/Plan:   No Cm needs noted.   Anticipated DC Date:  06/12/2013   Anticipated DC Plan:  HOME/SELF CARE      DC Planning Services  CM consult      Choice offered to / List presented to:             Status of service:  Completed, signed off Medicare Important Message given?   (If response is "NO", the following Medicare IM given date fields will be blank) Date Medicare IM given:   Date Additional Medicare IM given:    Discharge Disposition:  HOME/SELF CARE  Per UR Regulation:    If discussed at Long Length of Stay Meetings, dates discussed:    Comments:  06/12/13 1045 Arlyss Queen, RN BSN CM Pt discharged home today. No CM needs noted.  06/10/13 1505 Arlyss Queen, RN BSN CM

## 2013-06-10 NOTE — Progress Notes (Signed)
Critical Potassium of 2.7 called by lab at 0620 MD notified via text page at [facility]

## 2013-06-10 NOTE — Progress Notes (Signed)
UR chart review completed.  

## 2013-06-11 LAB — BASIC METABOLIC PANEL
BUN: 9 mg/dL (ref 6–23)
Calcium: 8.2 mg/dL — ABNORMAL LOW (ref 8.4–10.5)
GFR calc non Af Amer: >90 mL/min (ref >90)
Glucose, Bld: 363 mg/dL — ABNORMAL HIGH (ref 70–99)
Sodium: 135 mEq/L (ref 135–145)

## 2013-06-11 LAB — GLUCOSE, CAPILLARY
Glucose-Capillary: 287 mg/dL — ABNORMAL HIGH (ref 70–99)
Glucose-Capillary: 333 mg/dL — ABNORMAL HIGH (ref 70–99)
Glucose-Capillary: 306 mg/dL — ABNORMAL HIGH (ref 70–99)

## 2013-06-11 LAB — CBC
Hemoglobin: 13.5 g/dL (ref 12.0–15.0)
MCH: 30 pg (ref 26.0–34.0)
MCHC: 34.3 g/dL (ref 30.0–36.0)
Platelets: 246 10*3/uL (ref 150–400)

## 2013-06-11 MED ORDER — FLUCONAZOLE 100 MG PO TABS
100.0000 mg | ORAL_TABLET | Freq: Every day | ORAL | Status: DC
Start: 2013-06-11 — End: 2013-06-12
  Administered 2013-06-11: 100 mg via ORAL
  Filled 2013-06-11: qty 1

## 2013-06-11 MED ORDER — DOXYCYCLINE HYCLATE 100 MG PO TABS
100.0000 mg | ORAL_TABLET | Freq: Two times a day (BID) | ORAL | Status: DC
  Administered 2013-06-11 (×2): 100 mg via ORAL
  Filled 2013-06-11 (×2): qty 1

## 2013-06-11 MED ORDER — POTASSIUM CHLORIDE CRYS ER 20 MEQ PO TBCR
20.0000 meq | EXTENDED_RELEASE_TABLET | Freq: Every day | ORAL | Status: DC
  Administered 2013-06-11: 20 meq via ORAL
  Filled 2013-06-11: qty 1

## 2013-06-11 MED ORDER — CEFUROXIME AXETIL 250 MG PO TABS
500.0000 mg | ORAL_TABLET | Freq: Two times a day (BID) | ORAL | Status: DC
  Administered 2013-06-11 (×2): 500 mg via ORAL
  Filled 2013-06-11 (×3): qty 2

## 2013-06-11 NOTE — Progress Notes (Signed)
[name], [name]               ACCOUNT NO.:  [id number]  MEDICAL RECORD NO.:  [id number]  LOCATION:  IC07                          FACILITY:  [facility]  PHYSICIAN:  Barbara Homer. Sudie [name], M.D.DATE OF BIRTH:  1966/09/02  DATE OF PROCEDURE: DATE OF DISCHARGE:                                PROGRESS NOTE   SUBJECTIVE:  This 47 year old was admitted to the hospital with DKA yesterday.  She is currently in the ICU and her sugars are coming down.  She was seen by me at the very end of July with what was felt to be a sinusitis.  She was given ceftriaxone and put on Cipro 500 mg b.i.d., and called back the next day saying she felt much better.  Today, she tells me that she never really recovered from this and has kept on getting sick this summer.  She was seen in the Hospital Emergency Room a few days ago because her sugars had been running high in the last week.  She tells me that she is still coughing up thick sputum, has had fever and chills, and has not felt well.  OBJECTIVE:  GENERAL:  She is sitting up in bed.  Her significant other is with her in the room.  She is currently in no acute respiratory distress. LUNGS:  Her lungs appear to be clear throughout.  She is moving air well. HEART:  Has a regular rhythm.  Rate of about 70.  There is 1+ edema of the ankles.  Her chest x-ray showed moderate cardiac enlargement, which is stable, with a cardiac pacer was in unchanged position showed moderate vascular congestion.  LABORATORY DATA:  On admission white cell count 5,500, hemoglobin 13.2. Admission sodium was 124 with a potassium 3.5.  Recheck sodium 137, potassium 2.7.  Most recent sugar was 189. Urine showed 0-2 WBCs, 0-2 RBCs per HPF and rare bacteria.  MRSA by PCR was negative.  ASSESSMENT: 1. Diabetic ketoacidosis. 2. Presumptive bronchitis, also bacterial bronchitis. 3. Congestive heart failure. 4. Implantable cardioverter-defibrillator placement due to a very low  ejection fraction.  PLAN:  Sputum for culture and sensitivity is pending.  I am starting her on doxycycline 100 mg b.i.d.  I will also add ceftriaxone 1 g IV every 24 hours.  BMP is pending.     Barbara Homer. Sudie [name], M.D.     SDK/MEDQ  D:  06/10/2013  T:  06/11/2013  Job:  [id number]

## 2013-06-11 NOTE — Progress Notes (Signed)
Inpatient Diabetes Program Recommendations  AACE/ADA: New Consensus Statement on Inpatient Glycemic Control (2013)  Target Ranges:  Prepandial:   less than 140 mg/dL      Peak postprandial:   less than 180 mg/dL (1-2 hours)      Critically ill patients:  140 - 180 mg/dL   Results for KAMBRY, TAKACS (MRN [id number]) as of 06/11/2013 10:04  Ref. Range 06/10/2013 07:47 06/10/2013 08:53 06/10/2013 10:23 06/10/2013 11:21 06/10/2013 12:26 06/10/2013 16:41 06/10/2013 21:31 06/11/2013 07:12  Glucose-Capillary Latest Range: 70-99 mg/dL 409 (H) 811 (H) 914 (H) 195 (H) 187 (H) 286 (H) 330 (H) 287 (H)    Inpatient Diabetes Program Recommendations Insulin - Basal: Please consider increasing Levemir to 85 units daily. Correction (SSI): Please consider increasing Novolog correction to Resistant scale. HgbA1C: Please consider ordering an A1C to determine glycemic control over the past 2-3 months.  Note: Patient was transitioned off the insulin drip yesterday around 12:00 noon to SQ insulin.  Levemir 80 units was given at 11:04 am and Novolog sensitive correction scale was started.  Blood glucose since transitioning to SQ insulin has ranged from 187-330 mg/dl and fasting glucose this morning was 287 mg/dl.  Please consider increasing Levemir to 85 units daily, increasing Novolog correction to resistant scale, and ordering an A1C (last A1C was 13.0 on 06/21/2012).  Will continue to follow.  Thanks, Orlando Penner, RN, MSN, CCRN Diabetes Coordinator Inpatient Diabetes Program [phone] (Team Pager) [phone] ([facility] office) [phone] [facility] office)

## 2013-06-11 NOTE — Progress Notes (Signed)
DR [name] CALLED CONCERNING PT C/O PERINEAL YEAST INFECTION. ORDERS RECEIVED.

## 2013-06-12 LAB — CULTURE, RESPIRATORY W GRAM STAIN

## 2013-06-12 LAB — BASIC METABOLIC PANEL
CO2: 29 mEq/L (ref 19–32)
Calcium: 7.9 mg/dL — ABNORMAL LOW (ref 8.4–10.5)
Chloride: 96 mEq/L (ref 96–112)
GFR calc Af Amer: >90 mL/min (ref >90)
GFR calc non Af Amer: >90 mL/min (ref >90)
Sodium: 135 mEq/L (ref 135–145)

## 2013-06-12 LAB — GLUCOSE, CAPILLARY: Glucose-Capillary: 296 mg/dL — ABNORMAL HIGH (ref 70–99)

## 2013-06-12 MED ORDER — DOXYCYCLINE HYCLATE 100 MG PO TABS: 100.0000 mg | ORAL_TABLET | Freq: Two times a day (BID) | ORAL | Status: DC

## 2013-06-12 NOTE — Discharge Summary (Signed)
Physician Discharge Summary  Patient ID: [name] MRN: [id number] DOB/AGE: 10-20-1965 47 y.o. Primary Care Physician:KNOWLTON,STEPHEN D, MD Admit date: 06/09/2013 Discharge date: 06/12/2013    Discharge Diagnoses:  1. Uncontrolled type 2 diabetes mellitus, not DKA. 2. Bronchitis with growth of staph aureus, sensitive to doxycycline. No indication of pneumonia on chest x-ray or clinical grounds. 3. History of systolic congestive heart failure, not decompensated on this admission. 4. Status post ICD and  cardiomyopathy, ejection fraction 25%. 5. Hypertension. 6. Morbid obesity.     Medication List         benazepril 40 MG tablet  Commonly known as:  LOTENSIN  Take 40 mg by mouth daily.     carvedilol 25 MG tablet  Commonly known as:  COREG  Take 0.5 tablets (12.5 mg total) by mouth 2 (two) times daily.     cetirizine 10 MG tablet  Commonly known as:  ZYRTEC  Take 10 mg by mouth daily as needed for allergies.     cyclobenzaprine 10 MG tablet  Commonly known as:  FLEXERIL  Take 10 mg by mouth 3 (three) times daily as needed. For muscle spasms     doxycycline 100 MG tablet  Commonly known as:  VIBRA-TABS  Take 1 tablet (100 mg total) by mouth every 12 (twelve) hours.     ferrous sulfate 325 (65 FE) MG tablet  Take 325 mg by mouth 3 (three) times daily.     gabapentin 300 MG capsule  Commonly known as:  NEURONTIN  Take 300-600 mg by mouth 2 (two) times daily. 2 capsules in the morning, 1 at noon, and 2 at bedtime     hydrALAZINE 100 MG tablet  Commonly known as:  APRESOLINE  Take 100 mg by mouth 3 (three) times daily. For heart failure     IMPLANON [place]  Inject 1 each into the skin once. Gets every 3 years     insulin aspart 100 UNIT/ML injection  Commonly known as:  novoLOG  Inject 0-20 Units into the skin 3 (three) times daily with meals.     insulin detemir 100 UNIT/ML injection  Commonly known as:  LEVEMIR  Inject 80 Units into the skin at bedtime.     isosorbide mononitrate 60 MG 24 hr tablet  Commonly known as:  IMDUR  Take 60 mg by mouth 3 (three) times daily. For heart failure     omeprazole 40 MG capsule  Commonly known as:  PRILOSEC  Take 1 capsule (40 mg total) by mouth daily.     potassium chloride SA 20 MEQ tablet  Commonly known as:  K-DUR,KLOR-CON  Take 20 mEq by mouth 5 (five) times daily.     STOOL SOFTENER 100 MG capsule  Generic drug:  docusate sodium  Take 100 mg by mouth daily.     torsemide 20 MG tablet  Commonly known as:  DEMADEX  Take 2 tablets (40 mg total) by mouth daily. For heart failure     VENTOLIN HFA 108 (90 BASE) MCG/ACT inhaler  Generic drug:  albuterol  Inhale 2 puffs into the lungs every 6 (six) hours as needed. For shortness of breath     albuterol (5 MG/ML) 0.5% nebulizer solution  Commonly known as:  PROVENTIL  Take 0.5 mLs (2.5 mg total) by nebulization every 4 (four) hours as needed for wheezing or shortness of breath.     vitamin E 400 UNIT capsule  Take 400 Units by mouth daily.  Discharged Condition: Stable and improved.    Consults: None.  Significant Diagnostic Studies: Dg Chest Port 1 View  06/09/2013   *RADIOLOGY REPORT*  Clinical Data: Cough, shortness of breath, history of cardiomyopathy  PORTABLE CHEST - 1 VIEW  Comparison: The 04/06/2013  Findings: Moderate cardiac enlargement stable.  Cardiac pacer in unchanged position.  Moderate vascular congestion.  Mild peribronchial cuffing.  IMPRESSION: Fluid overload with no definite pulmonary edema currently.   Original Report Authenticated By: Esperanza Heir, M.D.    Lab Results: Basic Metabolic Panel:  Recent Labs  16/10/96 0926 06/12/13 0541  NA 135 135  K 3.3* 3.2*  CL 96 96  CO2 31 29  GLUCOSE 363* 339*  BUN 9 11  CREATININE 0.71 0.64  CALCIUM 8.2* 7.9*   Liver Function Tests:  Recent Labs  06/09/13 1920 06/10/13 0526  AST 13 13  ALT 9 8  ALKPHOS 109 99  BILITOT 0.5 0.4  PROT 8.3 7.9  ALBUMIN  3.4* 3.1*     CBC:  Recent Labs  06/09/13 1920 06/10/13 0526 06/11/13 0926  WBC 5.7 5.5 5.6  NEUTROABS 3.3  --   --   HGB 13.2 13.2 13.5  HCT 38.6 38.3 39.4  MCV 85.8 85.3 87.6  PLT 234 233 246    Recent Results (from the past 240 hour(s))  MRSA PCR SCREENING     Status: None   Collection Time    06/09/13 10:21 PM      Result Value Range Status   MRSA by PCR NEGATIVE  NEGATIVE Final   Comment:            The GeneXpert MRSA Assay (FDA     approved for NASAL specimens     only), is one component of a     comprehensive MRSA colonization     surveillance program. It is not     intended to diagnose MRSA     infection nor to guide or     monitor treatment for     MRSA infections.  CULTURE, EXPECTORATED SPUTUM-ASSESSMENT     Status: None   Collection Time    06/10/13 10:00 AM      Result Value Range Status   Specimen Description SPUTUM EXPECTORATED   Final   Special Requests NONE   Final   Sputum evaluation     Final   Value: THIS SPECIMEN IS ACCEPTABLE. RESPIRATORY CULTURE REPORT TO FOLLOW.     Performed at [facility]   Report Status 06/10/2013 FINAL   Final  CULTURE, RESPIRATORY (NON-EXPECTORATED)     Status: None   Collection Time    06/10/13 10:00 AM      Result Value Range Status   Specimen Description SPUTUM EXPECTORATED   Final   Special Requests NONE   Final   Gram Stain     Final   Value: MODERATE WBC PRESENT, PREDOMINANTLY PMN     MODERATE SQUAMOUS EPITHELIAL CELLS PRESENT     MODERATE GRAM POSITIVE COCCI IN PAIRS     Performed at [facility]   Culture     Final   Value: ABUNDANT STAPHYLOCOCCUS AUREUS     Note: RIFAMPIN AND GENTAMICIN SHOULD NOT BE USED AS SINGLE DRUGS FOR TREATMENT OF STAPH INFECTIONS. This organism DOES NOT demonstrate inducible Clindamycin resistance in vitro.     Performed at [facility]   Report Status 06/12/2013 FINAL   Final   Organism ID, Bacteria STAPHYLOCOCCUS AUREUS   Final  Hospital Course:  This very pleasant 47 year old lady was admitted with symptoms of elevated blood glucose. Please see initial history as outlined below: HPI:  47 year old female with a history of CHF, nonischemic myopathy, hypertension, diabetes mellitus status post ICD who came to the ED with chief complaint of elevated blood sugars for past few days. As per patient she was seen in the ED few days ago when she was treated with antibiotics and was sent home and since that time patient says that her blood sugar has been out of control. She is seen by Dr. [name], patient takes Levimer and NovoLog at home, patient says that her sugar usually runs very good. She also complains of reduced appetite, nausea mild shortness of breath no chest pain. No vomiting or diarrhea no fever. In the ED patient was found to have blood glucose of 761, with normal anion gap. The patient was treated appropriately with intravenous insulin and fluids as required. Fortunately, she did not have an anion gap. Sugars were improving with this measures. She also had a two-week history of productive cough and sputum culture showed staph aureus sensitive to several antibiotics. She had been started on doxycycline empirically and this will be continued at home. Her glucoses in the upper 200 range now compared to 600. She feels much improved. There is no respiratory difficulties. Discharge Exam: Blood pressure 126/96, pulse 98, temperature 98.5 F (36.9 C), temperature source Oral, resp. rate 22, height 5\' 6"  (1.676 m), weight 123.9 kg (273 lb 2.4 oz), SpO2 98.00%. She is obese but looks systemically well. Lung fields are entirely clear without any evidence of wheezing, crackles or bronchial breathing. Heart sounds are present with sinus rhythm. There is a systolic murmur, likely due to her mitral regurgitation. There is no clinical evidence of heart failure. She is alert and orientated.  Disposition:  Home. Followup with her PCP, Dr. [name] in a couple  weeks.      Discharge Orders   Future Appointments Provider Department Dept Phone   06/26/2013 8:15 AM [facility] [place] Office [phone]   Future Orders Complete By Expires   Diet - low sodium heart healthy  As directed    Increase activity slowly  As directed       Follow-up Information   Follow up with [name], MD. Schedule an appointment as soon as possible for a visit in 2 weeks.   Specialty:  Family Medicine   Contact information:   [address] [place] [address] [phone]       Signed: Wilson Singer   06/12/2013, 8:47 AM

## 2013-06-12 NOTE — Progress Notes (Signed)
[name], [name]               ACCOUNT NO.:  [id number]  MEDICAL RECORD NO.:  [id number]  LOCATION:  IC07                          FACILITY:  [facility]  PHYSICIAN:  [name]. [name], M.D.DATE OF BIRTH:  May 16, 1966  DATE OF PROCEDURE: DATE OF DISCHARGE:                                PROGRESS NOTE   SUBJECTIVE:  She continues in the ICU.  She definitely feels better. Her IV came out.  She is having some lower abdominal pain on the right from coughing.  OBJECTIVE:  VITAL SIGNS:  Temperature is 98.5, pulse 100, respiratory rate 26, blood pressure 116/74. GENERAL:  She is oriented, alert.  She is morbidly obese.  Her significant other is in the room with her. LUNGS:  Her lungs appear to be clear throughout and she is moving air well without intercostal retraction or use of accessory muscles of respiration. HEART:  A regular rhythm, rate of about 70. ABDOMEN:  Does show mild tenderness in the right lower quadrant, and she has no edema of the ankles.  Her most recent potassium was 2.7 yesterday.  Her glucose have been in the upper 100, lower 200 range.  ASSESSMENT: 1. Diabetic ketoacidosis, resolved. 2. Presumptive bronchitis, improved. 3. Congestive heart failure. 4. Implantable cardioverted defibrillator placement due to low     ejection fraction. 5. Hypokalemia.  PLAN:  We will recheck a BMP today.  Potassium supplementation is needed. She will be transferred from the ICU to med surg floor.  I am awaiting the results of the culture and sensitivity of her sputum.     [name]. [name], M.D.     SDK/MEDQ  D:  06/11/2013  T:  06/12/2013  Job:  [id number]

## 2013-06-12 NOTE — Progress Notes (Signed)
06/12/13 0940 Reviewed discharge instructions with patient, husband at bedside. Given copy of instructions, medication list, prescription, f/u appointment as scheduled. Noted when home medications next due on list. Patient and husband verbalize understanding of instructions. No IV access present this morning. Telemetry d/c'd. No c/o pain or discomfort. Pt left floor in stable condition via w/c accompanied by nurse tech. Earnstine Regal, RN

## 2013-06-12 NOTE — Progress Notes (Signed)
Inpatient Diabetes Program Recommendations  AACE/ADA: New Consensus Statement on Inpatient Glycemic Control (2013)  Target Ranges:  Prepandial:   less than 140 mg/dL      Peak postprandial:   less than 180 mg/dL (1-2 hours)      Critically ill patients:  140 - 180 mg/dL   Results for [name], [name] (MRN [id number]) as of 06/12/2013 08:38  Ref. Range 06/11/2013 07:12 06/11/2013 11:21 06/11/2013 16:06 06/11/2013 21:31 06/12/2013 07:30  Glucose-Capillary Latest Range: 70-99 mg/dL 884 (H) 166 (H) 063 (H) 306 (H) 296 (H)    Inpatient Diabetes Program Recommendations Insulin - Basal: Please consider increasing Levemir to 85 units daily. Correction (SSI): Please consider increasing Novolog correction to Resistant scale. HgbA1C: Please consider ordering an A1C to determine glycemic control over the past 2-3 months.   Thanks, Orlando Penner, RN, MSN, CCRN Diabetes Coordinator Inpatient Diabetes Program [phone] (Team Pager) [phone] ([facility] office) [phone] [facility] office)

## 2013-06-12 NOTE — Plan of Care (Signed)
Problem: Phase III Progression Outcomes Goal: Patient verbalizes diet requirements Outcome: Completed/Met Date Met:  06/12/13 06/12/13 1610 Patient received nutrition/diet education from Dr. Karilyn Cota this morning. Patient and husband receptive to education, verbalize understanding of following glycemix index for foods under inidex of 40 as instructed per MD. Education sheet with glycemix index food chart provided for patient, approved per MD. Provided diabetes education sheets and diet education with discharge teaching. Earnstine Regal, RN

## 2013-06-26 ENCOUNTER — Ambulatory Visit (INDEPENDENT_AMBULATORY_CARE_PROVIDER_SITE_OTHER): Payer: Medicare Other | Admitting: *Deleted

## 2013-06-26 DIAGNOSIS — I5022 Chronic systolic (congestive) heart failure: Secondary | ICD-10-CM

## 2013-06-26 DIAGNOSIS — Z95810 Presence of automatic (implantable) cardiac defibrillator: Secondary | ICD-10-CM

## 2013-06-30 ENCOUNTER — Encounter: Payer: Self-pay | Admitting: Internal Medicine

## 2013-06-30 LAB — REMOTE ICD DEVICE
AL IMPEDENCE ICD: 501 Ohm
ATRIAL PACING ICD: 99 pct
DEVICE MODEL ICD: 108094
LV LEAD IMPEDENCE ICD: 970 Ohm
TZAT-0001FASTVT: 1
TZAT-0001FASTVT: 2
TZAT-0001SLOWVT: 1
TZAT-0002FASTVT: NEGATIVE
TZAT-0013FASTVT: 1
TZAT-0018FASTVT: NEGATIVE
TZAT-0018SLOWVT: NEGATIVE
TZAT-0018SLOWVT: NEGATIVE
TZON-0003FASTVT: 300 ms
TZST-0001FASTVT: 3
TZST-0001FASTVT: 4
TZST-0001FASTVT: 5
TZST-0001FASTVT: 6
TZST-0001SLOWVT: 3
TZST-0001SLOWVT: 6
TZST-0001SLOWVT: 7
TZST-0003FASTVT: 31 J
TZST-0003FASTVT: 41 J
TZST-0003FASTVT: 41 J
TZST-0003FASTVT: 41 J
TZST-0003SLOWVT: 41 J
TZST-0003SLOWVT: 41 J
TZST-0003SLOWVT: 41 J

## 2013-08-06 ENCOUNTER — Ambulatory Visit (INDEPENDENT_AMBULATORY_CARE_PROVIDER_SITE_OTHER): Payer: Medicare Other

## 2013-08-06 ENCOUNTER — Encounter: Payer: Self-pay | Admitting: Orthopedic Surgery

## 2013-08-06 ENCOUNTER — Ambulatory Visit (INDEPENDENT_AMBULATORY_CARE_PROVIDER_SITE_OTHER): Payer: Medicare Other | Admitting: Orthopedic Surgery

## 2013-08-06 VITALS — BP 114/82 | Ht 66.0 in | Wt 272.0 lb

## 2013-08-06 DIAGNOSIS — M7550 Bursitis of unspecified shoulder: Secondary | ICD-10-CM | POA: Insufficient documentation

## 2013-08-06 DIAGNOSIS — M25519 Pain in unspecified shoulder: Secondary | ICD-10-CM

## 2013-08-06 DIAGNOSIS — M25512 Pain in left shoulder: Secondary | ICD-10-CM

## 2013-08-06 DIAGNOSIS — M67919 Unspecified disorder of synovium and tendon, unspecified shoulder: Secondary | ICD-10-CM

## 2013-08-06 DIAGNOSIS — M7552 Bursitis of left shoulder: Secondary | ICD-10-CM

## 2013-08-06 MED ORDER — NABUMETONE 500 MG PO TABS
500.0000 mg | ORAL_TABLET | Freq: Two times a day (BID) | ORAL | Status: DC
Start: 2013-08-06 — End: 2013-12-08

## 2013-08-06 NOTE — Patient Instructions (Signed)
Bursitis  Bursitis is inflammation of a bursa. A bursa is a soft, fluid-filled sac. It cushions the soft tissue around a bone. Bursitis often occurs in the bursas near the shoulders, elbows, knees, pelvis, hips, heel, and Achilles tendon.   SYMPTOMS   · Pain and tenderness in the affected area. Sometimes, pain radiates into surrounding areas. Specifically, pain with movement.  · Limited range of motion of the affected joint.  · Sometimes, painless swelling of the bursa.  · Fever (when infected).  CAUSES   · Injury to a joint or bursa.  · Overuse or strenuous exercise of a joint.  · Gout (disease with inflamed joints).  · Prolonged pressure on a joint containing bursas (resting on an elbow or kneeling).  · Arthritis.  · Acute or chronic infection.  · Calcium deposits in shoulder tendons, with degeneration of the tendon.  RISK INCREASES WITH:  · Vigorous, repeated, or sudden increase in athletic training or activity level.  · Failure to warm-up properly.  · Overstretching.  · Improper exercise technique.  · Playing sports on [employer].  PREVENTION  · Avoid injuries or overuse of muscles.  · Warm-up and cool down properly. Do this before and after physical activity.  · Maintain proper conditioning:  · Joint flexibility.  · Muscle strength and endurance.  · Cardiovascular fitness.  · Learn and use proper technique.  · Wear protective equipment.  PROGNOSIS   With proper treatment, symptoms often go away within 7 to 14 days.   RELATED COMPLICATIONS   · Frequent recurrence of symptoms. This can result in a chronic, repetitive problem.  · Joint stiffness.  · Limited joint movement.  · Infection of bursa.  · Chronic inflammation or scarring of bursa.  TREATMENT  Treatment first involves protecting and resting the bursa and its joint. You may use ice or an elastic bandage to reduce inflammation. Anti-inflammatory medicines may help resolve the swelling. If symptoms persist despite treatment, a caregiver may withdraw fluid  from the bursa. They might also consider a corticosteroid injection. Sometimes, bursitis will persist in spite of non-surgical treatment or will become infected. These cases may require removal (surgical excision) of the bursa.   MEDICATION   · If pain medicine is needed, nonsteroidal anti-inflammatory medicines, such as aspirin and ibuprofen, or other minor pain relievers, such as acetaminophen, are often recommended.  · Do not take pain medicine for 7 days before surgery.  · Prescription pain relievers are usually only prescribed after surgery. Use only as directed and only as much as you need.  · Ointments applied to the skin may be helpful.  · Corticosteroid injections may be given. This is done to reduce inflammation in the bursa.  HEAT AND COLD:  · Cold treatment (icing) relieves pain and reduces inflammation. Cold treatment should be applied for 10 to 15 minutes every 2 to 3 hours for inflammation and pain, and immediately after any activity that aggravates your symptoms. Use ice packs or an ice massage.  · Heat treatment may be used prior to performing the stretching and strengthening activities prescribed by your caregiver, physical therapist, or athletic trainer. Use a heat pack or a warm soak.  SEEK MEDICAL CARE IF:   · Symptoms get worse or do not improve in 2 weeks, despite treatment.  · New, unexplained symptoms develop. (Drugs used in treatment may produce side effects.)  Document Released: 08/28/2005 Document Revised: 11/20/2011 Document Reviewed: 12/10/2008  ExitCare® Patient Information ©2014 [employer].

## 2013-08-06 NOTE — Progress Notes (Signed)
Patient ID: [name], female   DOB: 09-04-1966, 47 y.o.   MRN: [id number]  Chief Complaint  Patient presents with  . Shoulder Pain    Left shoulder and arm pain. Referred by Dr. [name]    HISTORY:  This 47 year old female who presents with acute onset of left shoulder pain without trauma. She complains of sharp dull throbbing 7/10 pain which started several weeks back when away for brief period came back and then has not left. The pain started towards the end of October is constant she rates it 7/10 is unrelieved by hydrocodone 5 mg is associated with some numbness over the left deltoid the shoulder is stiff and swollen nothing has made it better is worse with movement and it hurts all the time. Review of systems chills and fatigue blurred vision chest pain shortness of breath and snoring heartburn muscle pain joint pain itching numbness seasonal allergies  Medications include torsemide 20, gabapentin 300, Carbatrol 25, Zyrtec. Hydralazine NovoLog Flexeril Colace vitamin E benazepril iron and isosorbide  Family history of heart disease asthma diabetes cancer arthritis  Social history she did not list a work or employment she doesn't smoke or drink no allergies  She monitors her diabetes hypertension neuropathy acid reflux and cardiomyopathy  She's had an appendectomy cholecystectomy and knee surgery  Vital signs BP 114/82  Ht 5\' 6"  (1.676 m)  Wt 272 lb (123.378 kg)  BMI 43.92 kg/m2   General appearance: Development, nutrition are normal. Body habitus large No gross deformities are noted and grooming normal.  Peripheral vascular system no swelling or varicose veins are noted and pulses are palpable without tenderness, temperature warm to touch no edema.  No palpable lymph nodes are noted in the cervical area or axillae.  The skin overlying the right and left shoulder cervical and thoracic spine is normal without rash, lesion or ulceration  Deep tendon reflexes are normal  and equal. And pathologic reflexes such as Hoffman sign are negative.  Sensation remains normal.  The patient is oriented to person place and time, the mood and affect are normal  Ambulation remains normal  Cervical spine no mass or tenderness. Range of motion is normal. Muscle tone is normal. Skin is normal.  Right shoulder inspection reveals no tenderness or malalignment. There is no crepitation. The range of motion remains full flexion internal and external rotation. Stability tests are normal in abduction external rotation inferior subluxation test as well as the posterior stress test. Manual muscle testing of the supraspinatus, internal and external rotators 5 over 5  Impingement sign is normal, Hawkins maneuver normal, a.c. joint stress test normal.  Left shoulder  inspection reveals tenderness subacromial bursa. Acromial region without crepitation she has full passive external rotation painful for elevation of 210 impingement sign positive stability test negative horn blower sign negative internal/external rotation strength normal  X-ray normal shoulder with mild subacromial sclerotic bone  Impression bursitis  Recommend injection, anti-inflammatories rest in a sling and two-week followup reassess.  Subacromial Shoulder Injection Procedure Note  Pre-operative Diagnosis: left RC Syndrome  Post-operative Diagnosis: same  Indications: pain   Anesthesia: ethyl chloride   Procedure Details   Verbal consent was obtained for the procedure. The shoulder was prepped withalcohol and the skin was anesthetized. A 20 gauge needle was advanced into the subacromial space through posterior approach without difficulty  The space was then injected with 3 ml 1% lidocaine and 1 ml of depomedrol. The injection site was cleansed with isopropyl alcohol and a  dressing was applied.  Complications:  None; patient tolerated the procedure well.

## 2013-08-21 ENCOUNTER — Ambulatory Visit (INDEPENDENT_AMBULATORY_CARE_PROVIDER_SITE_OTHER): Payer: Medicare Other | Admitting: Orthopedic Surgery

## 2013-08-21 ENCOUNTER — Ambulatory Visit (INDEPENDENT_AMBULATORY_CARE_PROVIDER_SITE_OTHER): Payer: Medicare Other

## 2013-08-21 ENCOUNTER — Encounter: Payer: Self-pay | Admitting: Orthopedic Surgery

## 2013-08-21 VITALS — BP 102/76 | Ht 66.0 in | Wt 269.0 lb

## 2013-08-21 DIAGNOSIS — M5030 Other cervical disc degeneration, unspecified cervical region: Secondary | ICD-10-CM

## 2013-08-21 DIAGNOSIS — R200 Anesthesia of skin: Secondary | ICD-10-CM

## 2013-08-21 DIAGNOSIS — S43429A Sprain of unspecified rotator cuff capsule, initial encounter: Secondary | ICD-10-CM

## 2013-08-21 DIAGNOSIS — R209 Unspecified disturbances of skin sensation: Secondary | ICD-10-CM

## 2013-08-21 DIAGNOSIS — M542 Cervicalgia: Secondary | ICD-10-CM

## 2013-08-21 DIAGNOSIS — M75102 Unspecified rotator cuff tear or rupture of left shoulder, not specified as traumatic: Secondary | ICD-10-CM

## 2013-08-21 DIAGNOSIS — M47812 Spondylosis without myelopathy or radiculopathy, cervical region: Secondary | ICD-10-CM

## 2013-08-21 NOTE — Progress Notes (Signed)
Patient ID: [name], female   DOB: 1966/03/13, 47 y.o.   MRN: [id number]  Chief Complaint  Patient presents with  . Follow-up    2 week follow up left shoulder bursitis s/p injection    BP 102/76  Ht 5\' 6"  (1.676 m)  Wt 269 lb (122.018 kg)  BMI 43.44 kg/m2  Encounter Diagnoses  Name Primary?  . Neck pain   . Numbness and tingling in hands Yes  . Degenerative cervical disc   . Cervical spondylosis without myelopathy   . Rotator cuff tear, left     This is a 47 year old female acute onset left shoulder pain without trauma complained of pain radiating down her left arm with numbness and tingling and burning pain over the left deltoid her pain has been unrelieved by hydrocodone Flexeril gabapentin subacromial injection.  Review of systems recent jaw pain secondary to oral infection currently undergoing penicillin therapy  Other review of symptoms include numbness and tingling left hand and arm weakness and left hand and arm.  New x-rays of her cervical spine showed degenerative disc disease and she's been diagnosed with this before. However the body habitus prevents a real good series of films and CT scan recommended secondary to pacemaker and defibrillator.  Also recommend arthrogram secondary to same devices in place.  Continue current medications  Followup after imaging tests have been completed  We did notice today on exam the following. Vital signs are stable as above. She is oriented x3 mood and affect are normal gait is normal general appearance is normal with obesity. She has painful for elevation of the shoulder tenderness in the left cervical spine numbness and tingling decreased sensation in the left hand global skin intact. Normal pulse.  Arthrogram left shoulder  CT scan cervical spine

## 2013-08-21 NOTE — Patient Instructions (Signed)
  CT C spine  Shoulder arthrogram

## 2013-08-29 ENCOUNTER — Ambulatory Visit (HOSPITAL_COMMUNITY)
Admission: RE | Admit: 2013-08-29 | Discharge: 2013-08-29 | Disposition: A | Discharge status: Home / Self Care | Payer: Medicare Other | Source: Ambulatory Visit | Attending: Orthopedic Surgery | Admitting: Orthopedic Surgery

## 2013-08-29 DIAGNOSIS — M542 Cervicalgia: Secondary | ICD-10-CM

## 2013-08-29 DIAGNOSIS — M4802 Spinal stenosis, cervical region: Secondary | ICD-10-CM | POA: Insufficient documentation

## 2013-08-29 DIAGNOSIS — R209 Unspecified disturbances of skin sensation: Secondary | ICD-10-CM | POA: Insufficient documentation

## 2013-08-29 DIAGNOSIS — R200 Anesthesia of skin: Secondary | ICD-10-CM

## 2013-09-18 ENCOUNTER — Encounter: Payer: Self-pay | Admitting: Orthopedic Surgery

## 2013-09-18 ENCOUNTER — Ambulatory Visit (INDEPENDENT_AMBULATORY_CARE_PROVIDER_SITE_OTHER): Payer: Medicare Other | Admitting: Orthopedic Surgery

## 2013-09-18 ENCOUNTER — Other Ambulatory Visit: Payer: Self-pay | Admitting: *Deleted

## 2013-09-18 VITALS — BP 98/60 | Ht 66.0 in | Wt 269.0 lb

## 2013-09-18 DIAGNOSIS — M47812 Spondylosis without myelopathy or radiculopathy, cervical region: Secondary | ICD-10-CM

## 2013-09-18 DIAGNOSIS — IMO0002 Reserved for concepts with insufficient information to code with codable children: Secondary | ICD-10-CM

## 2013-09-18 DIAGNOSIS — M75102 Unspecified rotator cuff tear or rupture of left shoulder, not specified as traumatic: Secondary | ICD-10-CM

## 2013-09-18 DIAGNOSIS — S43429A Sprain of unspecified rotator cuff capsule, initial encounter: Secondary | ICD-10-CM

## 2013-09-18 DIAGNOSIS — M792 Neuralgia and neuritis, unspecified: Secondary | ICD-10-CM | POA: Insufficient documentation

## 2013-09-18 DIAGNOSIS — M5030 Other cervical disc degeneration, unspecified cervical region: Secondary | ICD-10-CM

## 2013-09-18 MED ORDER — GABAPENTIN 300 MG PO CAPS: ORAL_CAPSULE | ORAL | Status: DC

## 2013-09-18 MED ORDER — PREDNISONE 5 MG PO TABS: 5.0000 mg | ORAL_TABLET | Freq: Every day | ORAL | Status: DC

## 2013-09-18 NOTE — Patient Instructions (Addendum)
Call to arrange therapy at [facility]  You have cervical spine disc disease which is degeneration of the discs in the neck. The condition history good with physical therapy and arthritis medication.  You still need to have her shoulder evaluate for rotator cuff tear. We will schedule an arthrogram of the shoulder.

## 2013-09-18 NOTE — Progress Notes (Signed)
Patient ID: [name], female   DOB: 26-Oct-1965, 48 y.o.   MRN: [id number]  Chief Complaint  Patient presents with  . Results    Review CT and Arthrogram Results   We did not get the arthrogram of the shoulder for some reason the orders were not correct  She still complains of burning pain down the left arm and pain in the left shoulder area I think she has 2 problems a rotator cuff problem in a cervical disc problem. I did get a look at her CT and his associates degenerative disc disease but because of her body habitus the study was considered non-optimal. Her symptoms suggest radicular pain along with some musculoskeletal symptoms. She is oriented 1500 mg of Neurontin per day for neuropathy it does not control her left arm radicular pain  Recommend that she get the arthrogram just to separate the 2 disease processes and get a better handle on it  Him also done upon her prednisone 5 mg a day to help with the neurologic symptoms she is advised to keep control of the glucose from the diabetes I also increased her Neurontin  Followup after MRI

## 2013-09-24 ENCOUNTER — Ambulatory Visit (HOSPITAL_COMMUNITY)
Admission: RE | Admit: 2013-09-24 | Discharge: 2013-09-24 | Disposition: A | Discharge status: Home / Self Care | Payer: Medicare Other | Source: Ambulatory Visit | Attending: Orthopedic Surgery | Admitting: Orthopedic Surgery

## 2013-09-24 DIAGNOSIS — M542 Cervicalgia: Secondary | ICD-10-CM | POA: Insufficient documentation

## 2013-09-24 DIAGNOSIS — E119 Type 2 diabetes mellitus without complications: Secondary | ICD-10-CM | POA: Insufficient documentation

## 2013-09-24 DIAGNOSIS — R209 Unspecified disturbances of skin sensation: Secondary | ICD-10-CM | POA: Insufficient documentation

## 2013-09-24 DIAGNOSIS — M25519 Pain in unspecified shoulder: Secondary | ICD-10-CM | POA: Insufficient documentation

## 2013-09-24 DIAGNOSIS — IMO0001 Reserved for inherently not codable concepts without codable children: Secondary | ICD-10-CM | POA: Insufficient documentation

## 2013-09-24 NOTE — Evaluation (Signed)
Physical Therapy Evaluation  Patient Details  Name: [name] MRN: [id number] Date of Birth: [birth year]  Today's Date: 09/24/2013 Time: 1040-1130 PT Time Calculation (min): 50 min EVAL 1              Visit#: 1 of 12  Re-eval: 10/24/13 Assessment Diagnosis: degenerative cervical disease  Next MD Visit: Dr. Romeo Apple  Prior Therapy: no  Authorization: Medicare     Authorization Time Period:    Authorization Visit#: 1 of 10   Past Medical History:  Past Medical History  Diagnosis Date  . CHF (congestive heart failure)     chronic systolic CHF,  . Nonischemic cardiomyopathy     EF 25%  . Mitral regurgitation     moderate to severe  . S/P cardiac catheterization 10/06    normal coronary arteries  . LBBB (left bundle branch block)   . HTN (hypertension)   . Asthma   . IBS (irritable bowel syndrome)     with primarily constipation  . Morbid obesity   . GERD (gastroesophageal reflux disease)   . IDA (iron deficiency anemia)     2o TO SB AVMS, Hx parenteral iron Dr Mariel Sleet  . AVM (arteriovenous malformation)     Small bowel, s/p duble balloon enteroscopy/APC jejunum Dr Gwinda Passe [facility]) 01/23/2011  . Anemia, chronic disease   . Peripheral neuropathy   . OSA (obstructive sleep apnea)     on CPAP qhs  . GI bleed 06/2009    Hgb 7.5, ferritin 7, transfusion, iron IV  . Dyspnea on exertion     chronic  . Diabetes mellitus without complication    Past Surgical History:  Past Surgical History  Procedure Laterality Date  . Crdt-implantation  4/07    [employer]. remote- yes  . Appendectomy    . Cholecystectomy      biliary dyskinesia  . Mastectomy  2003    left, partial  . Knee arthroscopy  2005    left  . Small bowel enteroscopy  MAY 2012 [facility] DR. GILLIAM    SB AVMS s/p APC  . Colonoscopy  OCT 2010/SEP 2011    tortuous colon, 3 polyps-benign(2010),   . Upper gastrointestinal endoscopy  '08, '10, SEP 2011    mild antral gastritis (2010), negative SB  bx (2010), incomlpete Schatzki's ring (2011)  . Small bowel enteroscopy  SEP 2011 [facility]    Fields-NO AVMS  . Givens capsule study  NOV 2010     NO AVMS, normal  . Irrigation and debridement abscess  06/25/2012    Procedure: MINOR INCISION AND DRAINAGE OF ABSCESS;  Surgeon: [name], MD;  Location: [facility];  Service: General;  Laterality: [name];  Incision & Drainage of Infected Sebaceous Cyst on Chest  . Breast surgery    . Cardiac defibrillator placement      Subjective Symptoms/Limitations Symptoms: 48 year old female, chief complaints of left shoulder pain, left neck pain, decreased use of left UE, reports 10+ yr hx of symptoms, reports symptoms are worsening , pateint uses home aide 1 hour daily for personal care and houework  Pertinent History: debrillator, upcoming left shoulder arthogram, long term use of gabapentin, received one shoulder injections  Limitations: House hold activities;Lifting How long can you sit comfortably?: no limits with sitting  Special Tests:  CT scan neck: DDD in chart  Patient Stated Goals: decrease pain  Pain Assessment Currently in Pain?: Yes Pain Score: 5  Pain Location: Arm Pain Orientation: Left Pain Type:  Chronic pain Pain Radiating Towards: reports pain as ache, throb, and sharp  Pain Onset: More than a month ago Pain Frequency: Constant (worse in shoulder area ) Pain Relieving Factors: none , no relief with muscle relaxors or other pain meds   Effect of Pain on Daily Activities: sleep disturbance, sleeps on couch, decreased use of left UE over past year   Precautions/Restrictions  Precautions Precautions: ICD/Pacemaker  Balance Screening Balance Screen Has the patient fallen in the past 6 months: No Is the patient reluctant to leave their home because of a fear of falling? : No  Prior Functioning  Prior Function Level of Independence: Needs assistance with ADLs;Needs assistance with homemaking;Independent with gait Vocation:  On disability Leisure: Hobbies-no Comments: lives in daugther and fiance   Cognition/Observation Cognition Overall Cognitive Status: Within Functional Limits for tasks assessed Observation/Other Assessments Other Assessments: negative spurlings, negative empy can, limits B reaching behind head   Sensation/Coordination/Flexibility/Functional Tests Sensation Light Touch: Impaired Detail Light Touch Impaired Details: Impaired RUE;Impaired LUE Coordination Gross Motor Movements are Fluid and Coordinated: Yes Fine Motor Movements are Fluid and Coordinated: Not tested Functional Tests Functional Tests: FOTO 40%  Assessment RUE AROM (degrees) RUE Overall AROM Comments: WFL, pain free  Right Shoulder Flexion: 160 Degrees Right Shoulder ABduction: 140 Degrees Right Shoulder Internal Rotation:  (standing L1 behind back ) Right Shoulder External Rotation: 60 Degrees RUE PROM (degrees) RUE Overall PROM Comments: WNL, pain free  RUE Strength Right Shoulder Flexion: 4/5 Right Shoulder ABduction: 4/5 Right Shoulder External Rotation: 4/5 Right Elbow Flexion: 4/5 Right Elbow Extension: 3+/5 Right Wrist Flexion: 4/5 Right Wrist Extension: 4/5 Gross Grasp: Functional LUE AROM (degrees) Overall AROM Left Upper Extremity: Deficits LUE Overall AROM Comments: slow, guarded  Left Shoulder Flexion: 140 Degrees Left Shoulder ABduction: 140 Degrees Left Shoulder Internal Rotation:  (L1 behind back ) Left Shoulder External Rotation: 60 Degrees Left Elbow Flexion: 130 LUE PROM (degrees) LUE Overall PROM Comments: pain at end ranges  Left Shoulder Flexion: 160 Degrees Left Shoulder External Rotation: 70 Degrees LUE Strength Left Shoulder Flexion: 3+/5 Left Shoulder ABduction: 3/5 Left Shoulder External Rotation: 3+/5 Left Elbow Flexion: 4/5 Left Elbow Extension: 3+/5 Gross Grasp: Functional Cervical AROM Overall Cervical AROM Comments:  (no radicular symptoms ) Cervical Flexion:  20 Cervical Extension: 25 Cervical - Right Side Bend: 25 Cervical - Left Side Bend: 25 Cervical - Right Rotation: 55 Cervical - Left Rotation: 60 Palpation Palpation: no significant tenderness    Mobility/Balance  Posture/Postural Control Postural Limitations: forward head, upper cross   Exercise/Treatments Seated Exercises Postural Training: scap retractions 10x  Supine Exercises   Supine Other Supine Exercises: elevation and depression 10x, flexion 0-90 5x  Standing Other Standing Exercises: wall slides flexion 10x      Physical Therapy Assessment and Plan PT Assessment and Plan Clinical Impression Statement: 48 year old female referred to physical therapy with med dx of degenerative cervical  disease . She has chronic pain left UE worsening since September 2014, with initial onset 10 years ago for unknown reason. she reveals left shoulder flexion AROM limits and decreased functional use of her left UE per pain.  At time of evalaution her left shoulder movements produced  and increased pain, while her neck movements were normal range and pian free. she requires skilled outpatient therapy at this time to improve use of LEft UE and decrease pain. Without skilled therapy patient is at risk for increased pain, decreased functional use of shoulder, and increased need of home  aide  Pt will benefit from skilled therapeutic intervention in order to improve on the following deficits: Pain;Decreased strength;Decreased range of motion;Decreased mobility;Decreased activity tolerance;Impaired UE functional use Rehab Potential: Fair Clinical Impairments Affecting Rehab Potential: neuropathy Bilateral feet and hands , reports problem worsening, 9+/10 pain level reports at worst , chronic pain in shoulder  PT Frequency: Min 3X/week PT Duration: 6 weeks PT Treatment/Interventions: Therapeutic activities;Therapeutic exercise;Neuromuscular re-education;Patient/family education;Manual techniques;Other  (comment) PT Plan: no electrical stimulation for pain per debrillator , progress with posture, shoulder and neck exercises per tolerance, manual techniques for pain/ROM     Goals Home Exercise Program Pt/caregiver will Perform Home Exercise Program: Independently PT Goal: Perform Home Exercise Program - Progress: Goal set today PT Short Term Goals Time to Complete Short Term Goals: 3 weeks PT Short Term Goal 1: Patient to improve left shoulder AROM to 150-160 degrees for reaching into cabinets and dressing ADLS  PT Short Term Goal 2: Patient able to carry 5- 10 pound item using Bilateral UE at waist/chest level without increased pain  PT Short Term Goal 3: Pateint able to use left UE For lower extremity dressing wihtout increased symptoms  PT Long Term Goals Time to Complete Long Term Goals:  (6 weeks ) PT Long Term Goal 1: Patient able to perform all  UE dressing with max pain level 3/10  PT Long Term Goal 2: Patient to improve FOTO score to greater than 50    Problem List Patient Active Problem List   Diagnosis Date Noted  . Nerve pain 09/18/2013  . Numbness and tingling in hands 08/21/2013  . Neck pain 08/21/2013  . Degenerative cervical disc 08/21/2013  . Cervical spondylosis without myelopathy 08/21/2013  . Left rotator cuff tear 08/21/2013  . Bursitis of shoulder 08/06/2013  . Hyperglycemia without ketosis 06/09/2013  . Diabetes mellitus 06/09/2013  . Peripheral neuropathy 04/26/2011    Class: Chronic  . Mitral regurgitation 03/01/2011  . BACK PAIN 08/15/2010  . CONSTIPATION 05/04/2010  . RECTAL BLEEDING 05/04/2010  . ICD-[employer] 03/01/2010  . GASTROESOPHAGEAL REFLUX DISEASE, HX OF 10/12/2009  . SYSTOLIC HEART FAILURE, CHRONIC 09/15/2009  . CHEST PAIN-PRECORDIAL 09/15/2009  . ANEMIA, IRON DEFICIENCY 07/19/2009    General Behavior During Therapy: [facility] assessed/performed PT Plan of Care PT Home Exercise Plan: progress next visit with written home  program  PT Patient Instructions: instructed today in wall slides shoulder flexion, educated on rotator cuff anantomy and pathology  Consulted and Agree with Plan of Care: Patient  GP Functional Assessment Tool Used: FOTO  Functional Limitation: Other PT primary Other PT Primary Current Status (I3474(G8990): At least 60 percent but less than 80 percent impaired, limited or restricted Other PT Primary Goal Status ([id number](G8991): At least 20 percent but less than 40 percent impaired, limited or restricted  Judy PimpleMichelle Kaydo PT  LISA MASSIE 09/24/2013, 2:59 PM  Physician Documentation Your signature is required to indicate approval of the treatment plan as stated above.  Please sign and either send electronically or make a copy of this report for your files and return this physician signed original.   Please mark one 1.__approve of plan  2. ___approve of plan with the following conditions.   ______________________________  _____________________ Physician Signature                                                                                                             Date

## 2013-09-25 ENCOUNTER — Encounter: Payer: Medicare Other | Admitting: *Deleted

## 2013-09-25 ENCOUNTER — Encounter: Payer: Self-pay | Admitting: Internal Medicine

## 2013-09-25 DIAGNOSIS — I5022 Chronic systolic (congestive) heart failure: Secondary | ICD-10-CM

## 2013-09-25 DIAGNOSIS — Z95810 Presence of automatic (implantable) cardiac defibrillator: Secondary | ICD-10-CM

## 2013-09-25 LAB — MDC_IDC_ENUM_SESS_TYPE_REMOTE
Brady Statistic RV Percent Paced: 98 %
Date Time Interrogation Session: 20150115124200
HighPow Impedance: 49 Ohm
Lead Channel Impedance Value: 487 Ohm
Lead Channel Impedance Value: 894 Ohm
Lead Channel Sensing Intrinsic Amplitude: 1.7 mV
Lead Channel Sensing Intrinsic Amplitude: 20.9 mV
Lead Channel Setting Pacing Amplitude: 2.2 V
Lead Channel Setting Pacing Amplitude: 2.5 V
Lead Channel Setting Pacing Pulse Width: 0.3 ms
Lead Channel Setting Pacing Pulse Width: 0.8 ms
MDC IDC MSMT LEADCHNL LV SENSING INTR AMPL: 3.3 mV
MDC IDC MSMT LEADCHNL RA IMPEDANCE VALUE: 500 Ohm
MDC IDC PG SERIAL: 108094
MDC IDC SET LEADCHNL RA PACING AMPLITUDE: 2 V
MDC IDC SET ZONE DETECTION INTERVAL: 250 ms
MDC IDC SET ZONE DETECTION INTERVAL: 300 ms
MDC IDC SET ZONE DETECTION INTERVAL: 343 ms
MDC IDC STAT BRADY RA PERCENT PACED: 0 %

## 2013-09-26 ENCOUNTER — Encounter (HOSPITAL_COMMUNITY): Payer: Self-pay

## 2013-09-26 ENCOUNTER — Ambulatory Visit (HOSPITAL_COMMUNITY)
Admission: RE | Admit: 2013-09-26 | Discharge: 2013-09-26 | Disposition: A | Discharge status: Home / Self Care | Payer: Medicare Other | Source: Ambulatory Visit | Attending: Orthopedic Surgery | Admitting: Orthopedic Surgery

## 2013-09-26 VITALS — BP 104/69 | HR 86 | Temp 98.3°F | Resp 16

## 2013-09-26 DIAGNOSIS — M75102 Unspecified rotator cuff tear or rupture of left shoulder, not specified as traumatic: Secondary | ICD-10-CM

## 2013-09-26 DIAGNOSIS — Z95810 Presence of automatic (implantable) cardiac defibrillator: Secondary | ICD-10-CM | POA: Insufficient documentation

## 2013-09-26 DIAGNOSIS — M25519 Pain in unspecified shoulder: Secondary | ICD-10-CM | POA: Insufficient documentation

## 2013-09-26 DIAGNOSIS — M5030 Other cervical disc degeneration, unspecified cervical region: Secondary | ICD-10-CM

## 2013-09-26 MED ORDER — IOHEXOL 300 MG/ML  SOLN
50.0000 mL | Freq: Once | INTRAMUSCULAR | Status: AC | PRN
  Administered 2013-09-26: 12 mL via INTRA_ARTICULAR

## 2013-09-26 MED ORDER — LIDOCAINE HCL (PF) 1 % IJ SOLN
INTRAMUSCULAR | Status: AC
  Filled 2013-09-26: qty 5

## 2013-09-26 NOTE — Progress Notes (Signed)
Physical Therapy Treatment Patient Details  Name: [name] MRN: [id number] Date of Birth: 09-28-1965  Today's Date: 09/26/2013 Time: 0915-1000 PT Time Calculation (min): 45 min 3149-7026   Manual  0930-1000 therapeutic exercise  Visit#: 2 of 12  Re-eval: 10/24/13 Assessment Next MD Visit: dr  Dr Aline Brochure  10/07/13  Authorization: Medicare   Authorization Time Period:    Authorization Visit#: 2 of 10   Subjective: Symptoms/Limitations Symptoms: slept well the other day in bed, not chair, pain levles lower today than usual , able to complete wall slides at home  Pain Assessment Pain Score: 3  Pain Location: Neck Pain Orientation: Left Pain Type: Chronic pain  Precautions/Restrictions : PACEMAKER     Exercise/Treatment     Supine Flexion: AROM;10 reps (0-90) ABduction: AROM;Left;5 reps ( side lying ) Other Supine Exercises: elevation and depression 10x,  Other Supine Exercises: chest preess 1#  wand 10x Seated Retraction: AROM;Both;10 reps Prone    Sidelying External Rotation: AROM;10 reps;Left (2 sets ) Standing Other Standing Exercises: wall slides flexion 10x , wall washes 2 x 10  Pulleys Flexion: 3 minutes   ROM / Strengthening / Isometric Strengthening Proximal Shoulder Strengthening, Supine: CC/CCW circles  Ball on Wall: CC/CCW at 90 flexion 5X    Manual Therapy Manual Therapy: Joint mobilization Joint Mobilization: left shoulder supine grade 1-2 GH mobs, PROM flexion, STM left upper trapezius, STM left lower cervical paraspinals x 15 min  Weight Bearing Technique Weight Bearing Technique: No  Physical Therapy Assessment and Plan PT Assessment and Plan Clinical Impression Statement: improved left shoulder AROM flexion and decreased pain levels from initial evaluation, early treatment, left shoulder arthogram  today, good response to initial exercises  Pt will benefit from skilled therapeutic intervention in order to improve on the following  deficits: Pain;Decreased strength;Decreased range of motion;Decreased mobility;Decreased activity tolerance;Impaired UE functional use Rehab Potential: Fair Clinical Impairments Affecting Rehab Potential: neuropathy Bilateral feet and hands , reports problem worsening, 9+/10 pain level reports at worst , chronic pain in shoulder  PT Frequency: Min 3X/week PT Duration: 6 weeks PT Treatment/Interventions: Therapeutic activities;Therapeutic exercise;Neuromuscular re-education;Patient/family education;Manual techniques;Other (comment) PT Plan: no electrical stimulation for pain per debrillator , progress with posture, shoulder and neck exercises per tolerance, manual techniques for pain/ROM     Goals Home Exercise Program Pt/caregiver will Perform Home Exercise Program: Independently PT Short Term Goals Time to Complete Short Term Goals: 3 weeks PT Short Term Goal 1: Patient to improve left shoulder AROM to 150-160 degrees for reaching into cabinets and dressing ADLS  PT Short Term Goal 1 - Progress: Partly met PT Short Term Goal 2: Patient able to carry 5- 10 pound item using Bilateral UE at waist/chest level without increased pain  PT Short Term Goal 2 - Progress: Not met PT Short Term Goal 3: Pateint able to use left UE For lower extremity dressing wihtout increased symptoms  PT Short Term Goal 3 - Progress: Not met PT Long Term Goals Time to Complete Long Term Goals:  (6 weeks ) PT Long Term Goal 1: Patient able to perform all  UE dressing with max pain level 3/10  PT Long Term Goal 1 - Progress: Partly met PT Long Term Goal 2: Patient to improve FOTO score to greater than 50   PT Long Term Goal 2 - Progress: Not met   General Behavior During Therapy: WFL for tasks assessed/performed PT Plan of Care PT Home Exercise Plan: writen provided  PT Patient Instructions: written provided  Consulted and Agree with Plan of Care: Patient  GP Functional Assessment Tool Used: FOTO  Functional  Limitation: Other PT primary Other PT Primary Current Status (T9276): At least 60 percent but less than 80 percent impaired, limited or restricted Other PT Primary Goal Status (F9432): At least 20 percent but less than 40 percent impaired, limited or restricted  Rheanna Sergent 09/26/2013, 10:17 AM

## 2013-09-26 NOTE — Progress Notes (Signed)
Procedure complete no signs of distress.  

## 2013-09-29 ENCOUNTER — Telehealth: Payer: Self-pay | Admitting: *Deleted

## 2013-09-29 ENCOUNTER — Ambulatory Visit (HOSPITAL_COMMUNITY)
Admission: RE | Admit: 2013-09-29 | Discharge: 2013-09-29 | Disposition: A | Discharge status: Home / Self Care | Payer: Medicare Other | Source: Ambulatory Visit | Attending: Family Medicine | Admitting: Family Medicine

## 2013-09-29 DIAGNOSIS — M5030 Other cervical disc degeneration, unspecified cervical region: Secondary | ICD-10-CM

## 2013-09-29 DIAGNOSIS — M75102 Unspecified rotator cuff tear or rupture of left shoulder, not specified as traumatic: Secondary | ICD-10-CM

## 2013-09-29 NOTE — Progress Notes (Signed)
Physical Therapy Treatment Patient Details  Name: [name] MRN: [id number] Date of Birth: 1966-08-02  Today's Date: 09/29/2013 Time: 1302-1358 PT Time Calculation (min): 56 min Charge ;TE 1302-1340, Manual [id number]  Visit#: 3 of 12  Re-eval: 10/24/13 Assessment Diagnosis: degenerative cervical disease  Next MD Visit: Romeo Apple  10/07/13 Prior Therapy: no  Authorization: Medicare   Authorization Time Period:    Authorization Visit#: 3 of 10   Subjective: Symptoms/Limitations Symptoms: Pt reported symptoms of congestive heart failure from prednisone, reports stopping medication yesterday.  Pt encouraged to call MD as soon as possible to discuss symptoms.   Pain Assessment Currently in Pain?: Yes Pain Score: 5  Pain Location: Neck Pain Orientation: Right;Left  Precautions/Restrictions  Precautions Precautions: ICD/Pacemaker  Exercise/Treatments Machines for Strengthening UBE (Upper Arm Bike): 6' 3' fwd/3' bckd Standing Exercises Neck Retraction: 5 reps;5 secs Other Standing Exercises: scapular retraction 10x Seated Exercises Neck Retraction: 10 reps Cervical Rotation: 5 reps Shoulder Rolls: Backwards;10 reps Postural Training: scap retractions 10x  Seated Retraction: AROM;Both;10 reps Standing Other Standing Exercises: wall slides flexion 10x abduction 5x Bil UE    Manual Therapy Manual Therapy: Massage Massage: Bil Upper trapz and cervical/scapular region to reduce spasms   Physical Therapy Assessment and Plan PT Assessment and Plan Clinical Impression Statement: Began POC with exercises to improve cervical, Lt shoulder ROM and improve postural awareness.  Pt able to veralize correct posture using teach back method.  Pt moved very slowand labored through session.  Pt c/o headspin during session.  Pt educated on importance of hydration.  Manual techniques were complete to reduce cervical and scapular spasms and to reduce pain.  Pt encouraged to drink extra  water following manual and to call  MD about symptoms with medications.  PT Plan: no electrical stimulation for pain per debrillator , progress with posture, shoulder and neck exercises per tolerance, manual techniques for pain/ROM   Next session begin seated cervical and scapular retraction, cervical otation and sidelying, wback/x to v, progress to postural strengthening, continues with manual for PROM and pain.    Goals Home Exercise Program Pt/caregiver will Perform Home Exercise Program: Independently PT Short Term Goals Time to Complete Short Term Goals: 3 weeks PT Short Term Goal 1: Patient to improve left shoulder AROM to 150-160 degrees for reaching into cabinets and dressing ADLS  PT Short Term Goal 1 - Progress: Progressing toward goal PT Short Term Goal 2: Patient able to carry 5- 10 pound item using Bilateral UE at waist/chest level without increased pain  PT Short Term Goal 3: Pateint able to use left UE For lower extremity dressing wihtout increased symptoms  PT Long Term Goals Time to Complete Long Term Goals:  (6 weeks ) PT Long Term Goal 1: Patient able to perform all  UE dressing with max pain level 3/10  PT Long Term Goal 2: Patient to improve FOTO score to greater than 50    Problem List Patient Active Problem List   Diagnosis Date Noted  . Nerve pain 09/18/2013  . Numbness and tingling in hands 08/21/2013  . Neck pain 08/21/2013  . Degenerative cervical disc 08/21/2013  . Cervical spondylosis without myelopathy 08/21/2013  . Left rotator cuff tear 08/21/2013  . Bursitis of shoulder 08/06/2013  . Hyperglycemia without ketosis 06/09/2013  . Diabetes mellitus 06/09/2013  . Peripheral neuropathy 04/26/2011    Class: Chronic  . Mitral regurgitation 03/01/2011  . BACK PAIN 08/15/2010  . CONSTIPATION 05/04/2010  . RECTAL BLEEDING 05/04/2010  .  ICD-[employer] 03/01/2010  . GASTROESOPHAGEAL REFLUX DISEASE, HX OF 10/12/2009  . SYSTOLIC HEART FAILURE, CHRONIC  09/15/2009  . CHEST PAIN-PRECORDIAL 09/15/2009  . ANEMIA, IRON DEFICIENCY 07/19/2009    PT - End of Session Activity Tolerance: Patient tolerated treatment well;Patient limited by pain;Patient limited by fatigue General Behavior During Therapy: [facility] for tasks assessed/performed  GP    Juel BurrowCockerham, Caterra Ostroff Jo 09/29/2013, 2:08 PM

## 2013-09-29 NOTE — Telephone Encounter (Signed)
Call received from patient stating she has been on prednisone 5 mg since she was here on 09/18/13. She states that she had an episode Sunday of congestive heart failure. The patient took extra torsemide 20 mg as advised by her PCP, and she stated she started feeling better. She has not taken anymore of the prednisone since Sunday and is asking if you want her to start it again. I advised patient to not take anymore. Please advise

## 2013-09-30 ENCOUNTER — Other Ambulatory Visit: Payer: Self-pay | Admitting: Orthopedic Surgery

## 2013-09-30 MED ORDER — NABUMETONE 500 MG PO TABS: 500.0000 mg | ORAL_TABLET | Freq: Two times a day (BID) | ORAL | Status: DC

## 2013-09-30 NOTE — Telephone Encounter (Signed)
No start nabumetone 500 bid

## 2013-09-30 NOTE — Telephone Encounter (Signed)
Advised patient of Dr. Mort Sawyers reply and advised medication was sent to pharmacy.

## 2013-10-01 ENCOUNTER — Ambulatory Visit (HOSPITAL_COMMUNITY)
Admission: RE | Admit: 2013-10-01 | Discharge: 2013-10-01 | Disposition: A | Discharge status: Home / Self Care | Payer: Medicare Other | Source: Ambulatory Visit | Attending: Family Medicine | Admitting: Family Medicine

## 2013-10-01 DIAGNOSIS — M75102 Unspecified rotator cuff tear or rupture of left shoulder, not specified as traumatic: Secondary | ICD-10-CM

## 2013-10-01 DIAGNOSIS — M5030 Other cervical disc degeneration, unspecified cervical region: Secondary | ICD-10-CM

## 2013-10-01 NOTE — Progress Notes (Signed)
Physical Therapy Treatment Patient Details  Name: [name] MRN: [id number] Date of Birth: 14-Sep-1965  Today's Date: 10/01/2013 Time: 1020-1105 PT Time Calculation (min): 45 min PT charges  1020-1050 TE  1050-1105 manual  Visit#: 4 of 12  Re-eval: 10/24/13    Authorization: Medicare   Authorization Time Period:    Authorization Visit#: 4 of 10   Subjective: Symptoms/Limitations Symptoms: left shoulder little stiff and sore  Pertinent History: per arthogram results no RTC tear  Pain Assessment Pain Score: 2  Pain Location: Shoulder Pain Orientation: Left  Precautions/Restrictions     Exercise/Treatments Mobility/Balance        Stretches   Machines for Strengthening UBE (Upper Arm Bike): 6' 3' fwd/3' bckd Theraband Exercises   Standing Exercises Neck Retraction: 10 reps Other Standing Exercises: scapular retraction 10x Seated Exercises   Supine Exercises   Sidelying Exercises   Prone Exercises   Hand Exercises for Cervical Radiculopathy    Supine   Seated   Prone    Sidelying External Rotation: AROM;10 reps;Left (2 sets ) ABduction: 10 reps;Left;Limitations ABduction Limitations: 0-90 degrees  Standing Other Standing Exercises: wall slides flexion 2 x 10x,  abduction 5x Bil UE Pulleys Flexion: 2 minutes Therapy Ball   ROM / Strengthening / Isometric Strengthening Proximal Shoulder Strengthening, Supine: CC/CCW circles  Ball on Wall: CC/CCW at 90 flexion 5X    Stretches   Power Therapist, occupational Therapy Manual Therapy: Joint mobilization Joint Mobilization: left shoulder inferior glides, GH distraction, PROM flexion x  15 mn   Physical Therapy Assessment and Plan PT Assessment and Plan Clinical Impression Statement: improving left shoulder AROM and pain levels decreasing with daily activities, can benefit from progression into shoulder strengthening  PT Plan: no electrical stimulation for pain per  debrillator , progress with shoulder t band exercises next visit , standing shoulder AROM      Goals Home Exercise Program Pt/caregiver will Perform Home Exercise Program: Independently PT Goal: Perform Home Exercise Program - Progress: Progressing toward goal PT Short Term Goals Time to Complete Short Term Goals: 3 weeks PT Short Term Goal 1: Patient to improve left shoulder AROM to 150-160 degrees for reaching into cabinets and dressing ADLS  PT Short Term Goal 1 - Progress: Partly met PT Short Term Goal 2: Patient able to carry 5- 10 pound item using Bilateral UE at waist/chest level without increased pain  PT Short Term Goal 2 - Progress: Partly met PT Short Term Goal 3: Pateint able to use left UE For lower extremity dressing wihtout increased symptoms  PT Short Term Goal 3 - Progress: Partly met PT Long Term Goals Time to Complete Long Term Goals:  (6 weeks ) PT Long Term Goal 1: Patient able to perform all  UE dressing with max pain level 3/10  PT Long Term Goal 1 - Progress: Progressing toward goal PT Long Term Goal 2: Patient to improve FOTO score to greater than 50    Problem List Patient Active Problem List   Diagnosis Date Noted  . Nerve pain 09/18/2013  . Numbness and tingling in hands 08/21/2013  . Neck pain 08/21/2013  . Degenerative cervical disc 08/21/2013  . Cervical spondylosis without myelopathy 08/21/2013  . Left rotator cuff tear 08/21/2013  . Bursitis of shoulder 08/06/2013  . Hyperglycemia without ketosis 06/09/2013  . Diabetes mellitus 06/09/2013  . Peripheral neuropathy 04/26/2011    Class: Chronic  . Mitral regurgitation 03/01/2011  .  BACK PAIN 08/15/2010  . CONSTIPATION 05/04/2010  . RECTAL BLEEDING 05/04/2010  . ICD-[employer] 03/01/2010  . GASTROESOPHAGEAL REFLUX DISEASE, HX OF 10/12/2009  . SYSTOLIC HEART FAILURE, CHRONIC 09/15/2009  . CHEST PAIN-PRECORDIAL 09/15/2009  . ANEMIA, IRON DEFICIENCY 07/19/2009    PT - End of  Session Activity Tolerance: Patient tolerated treatment well;Patient limited by pain General Behavior During Therapy: [facility] for tasks assessed/performed  GP    Delanie Tirrell 10/01/2013, 10:57 AM

## 2013-10-06 ENCOUNTER — Ambulatory Visit (HOSPITAL_COMMUNITY)
Admission: RE | Admit: 2013-10-06 | Discharge: 2013-10-06 | Disposition: A | Discharge status: Home / Self Care | Payer: Medicare Other | Source: Ambulatory Visit | Attending: Orthopedic Surgery | Admitting: Orthopedic Surgery

## 2013-10-06 DIAGNOSIS — M75102 Unspecified rotator cuff tear or rupture of left shoulder, not specified as traumatic: Secondary | ICD-10-CM

## 2013-10-06 DIAGNOSIS — M5030 Other cervical disc degeneration, unspecified cervical region: Secondary | ICD-10-CM

## 2013-10-06 NOTE — Progress Notes (Signed)
Physical Therapy Treatment Patient Details  Name: [name] MRN: [id number] Date of Birth: [birth year]  Today's Date: 10/06/2013 Time: 1300-1350 PT Time Calculation (min): 50 min Charge TE 1300-1335, Manual [id number]  Visit#: 5 of 12  Re-eval: 10/24/13 Assessment Diagnosis: degenerative cervical disease  Next MD Visit: Romeo AppleHarrison  10/07/13 Prior Therapy: no  Authorization: Medicare   Authorization Time Period:    Authorization Visit#: 5 of 10   Subjective: Symptoms/Limitations Symptoms: Pt stated progressing slowly, current pain scale 4/10 cervical and Lt shoulder. Pain Assessment Currently in Pain?: Yes Pain Score: 4  Pain Location: Shoulder Pain Orientation: Left  Precautions/Restrictions  Precautions Precautions: ICD/Pacemaker  Exercise/Treatments Sidelying External Rotation: AROM;10 reps;Left ABduction: 10 reps;Left;Limitations ABduction Limitations: 0-90 degrees  Standing Extension: Both;10 reps;Theraband Theraband Level (Shoulder Extension): Level 3 (Green) Row: Both;10 reps;Theraband Theraband Level (Shoulder Row): Level 3 (Green) Retraction: Both;10 reps;Theraband Theraband Level (Shoulder Retraction): Level 3 (Green) Pulleys Flexion: 2 minutes ABduction: 2 minutes ROM / Strengthening / Isometric Strengthening Ball on Wall: CC/CCW at 90 flexion 10X each direction   Manual Therapy Manual Therapy: Joint mobilization Joint Mobilization: left shoulder inferior glides, GH distraction, PROM flexion  Massage: Bil Upper trapz and cervical/scapular region to reduce spasms  Physical Therapy Assessment and Plan PT Assessment and Plan Clinical Impression Statement: Progressed to postural strengthening exercises with theraband, pt able to verbalize correct posture landmakes using teach back method.  Manual techniques complete to improve A/PROM shoulder and MFR to reduce fascial restrictions and reduce pain.  Pt encouraged to drink extra water to reduce risk of  headaches following manual. PT Plan: no electrical stimulation for pain per debrillator , continue with shoulder t band exercises next visit     Goals Home Exercise Program Pt/caregiver will Perform Home Exercise Program: Independently PT Short Term Goals Time to Complete Short Term Goals: 3 weeks PT Short Term Goal 1: Patient to improve left shoulder AROM to 150-160 degrees for reaching into cabinets and dressing ADLS  PT Short Term Goal 1 - Progress: Progressing toward goal PT Short Term Goal 2: Patient able to carry 5- 10 pound item using Bilateral UE at waist/chest level without increased pain  PT Short Term Goal 3: Pateint able to use left UE For lower extremity dressing wihtout increased symptoms  PT Long Term Goals Time to Complete Long Term Goals:  (6 weeks ) PT Long Term Goal 1: Patient able to perform all  UE dressing with max pain level 3/10  PT Long Term Goal 2: Patient to improve FOTO score to greater than 50    Problem List Patient Active Problem List   Diagnosis Date Noted  . Nerve pain 09/18/2013  . Numbness and tingling in hands 08/21/2013  . Neck pain 08/21/2013  . Degenerative cervical disc 08/21/2013  . Cervical spondylosis without myelopathy 08/21/2013  . Left rotator cuff tear 08/21/2013  . Bursitis of shoulder 08/06/2013  . Hyperglycemia without ketosis 06/09/2013  . Diabetes mellitus 06/09/2013  . Peripheral neuropathy 04/26/2011    Class: Chronic  . Mitral regurgitation 03/01/2011  . BACK PAIN 08/15/2010  . CONSTIPATION 05/04/2010  . RECTAL BLEEDING 05/04/2010  . ICD-[employer] 03/01/2010  . GASTROESOPHAGEAL REFLUX DISEASE, HX OF 10/12/2009  . SYSTOLIC HEART FAILURE, CHRONIC 09/15/2009  . CHEST PAIN-PRECORDIAL 09/15/2009  . ANEMIA, IRON DEFICIENCY 07/19/2009    PT - End of Session Activity Tolerance: Patient tolerated treatment well;Patient limited by fatigue General Behavior During Therapy: [facility] for tasks assessed/performed  GP     Becky Saxockerham, [name]  Alvino Chapel 10/06/2013, 3:52 PM

## 2013-10-07 ENCOUNTER — Ambulatory Visit (INDEPENDENT_AMBULATORY_CARE_PROVIDER_SITE_OTHER): Payer: Medicare Other | Admitting: Orthopedic Surgery

## 2013-10-07 ENCOUNTER — Encounter: Payer: Self-pay | Admitting: *Deleted

## 2013-10-07 ENCOUNTER — Encounter: Payer: Self-pay | Admitting: Orthopedic Surgery

## 2013-10-07 VITALS — BP 110/70 | Ht 66.0 in | Wt 269.0 lb

## 2013-10-07 DIAGNOSIS — S43429A Sprain of unspecified rotator cuff capsule, initial encounter: Secondary | ICD-10-CM

## 2013-10-07 DIAGNOSIS — M67919 Unspecified disorder of synovium and tendon, unspecified shoulder: Secondary | ICD-10-CM

## 2013-10-07 DIAGNOSIS — M47812 Spondylosis without myelopathy or radiculopathy, cervical region: Secondary | ICD-10-CM

## 2013-10-07 DIAGNOSIS — M5030 Other cervical disc degeneration, unspecified cervical region: Secondary | ICD-10-CM

## 2013-10-07 DIAGNOSIS — M7552 Bursitis of left shoulder: Secondary | ICD-10-CM

## 2013-10-07 DIAGNOSIS — M719 Bursopathy, unspecified: Secondary | ICD-10-CM

## 2013-10-07 DIAGNOSIS — M75102 Unspecified rotator cuff tear or rupture of left shoulder, not specified as traumatic: Secondary | ICD-10-CM

## 2013-10-07 NOTE — Progress Notes (Signed)
Patient ID: [name], female   DOB: [birth year], 48 y.o.   MRN: [id number]  Chief Complaint  Patient presents with  . Results    Arthrogram results left shoulder    Ht 5\' 6"  (1.676 m)  Wt 269 lb (122.018 kg)  BMI 43.44 kg/m2  Encounter Diagnoses  Name Primary?  . Rotator cuff tear, left Yes  . Degenerative cervical disc   . Cervical spondylosis without myelopathy   . Bursitis of shoulder, left    The patient returns after arthrogram of the left shoulder shows no rotator cuff tear her MRI of the cervical spine does show cervical spondylosis. She's in physical therapy for that taking Flexeril, gabapentin and nabumetone she responded poorly to steroids orally.    IMPRESSION: Arthrogram report and viewing personally I agree that there is no cuff tear: No evidence of rotator cuff tear in the left shoulder on arthrography.   We discussed the following patient has improved with physical therapy and the medications and would like to continue. Once she is completed the therapy she will call us for a recheck if she's not better we will get a neurosurgical consult.

## 2013-10-08 ENCOUNTER — Ambulatory Visit (HOSPITAL_COMMUNITY): Payer: Medicare Other

## 2013-10-08 ENCOUNTER — Telehealth (HOSPITAL_COMMUNITY): Payer: Self-pay

## 2013-10-10 ENCOUNTER — Ambulatory Visit (HOSPITAL_COMMUNITY)
Admission: RE | Admit: 2013-10-10 | Discharge: 2013-10-10 | Disposition: A | Discharge status: Home / Self Care | Payer: Medicare Other | Source: Ambulatory Visit | Attending: Family Medicine | Admitting: Family Medicine

## 2013-10-10 DIAGNOSIS — M75102 Unspecified rotator cuff tear or rupture of left shoulder, not specified as traumatic: Secondary | ICD-10-CM

## 2013-10-10 DIAGNOSIS — M5030 Other cervical disc degeneration, unspecified cervical region: Secondary | ICD-10-CM

## 2013-10-10 NOTE — Progress Notes (Signed)
Physical Therapy Treatment Patient Details  Name: [name] MRN: [id number] Date of Birth: March 31, 1966  Today's Date: 10/10/2013 Time: 1015-1105 PT Time Calculation (min): 50 min Charge: TE 1015-1040, Manual 1040-1105  Visit#: 6 of 12  Re-eval: 10/24/13 Assessment Diagnosis: degenerative cervical disease  Next MD Visit: Romeo Apple  11/13/2013  Authorization: Medicare   Authorization Time Period:    Authorization Visit#: 6 of 10   Subjective: Symptoms/Limitations Symptoms: Pt stated she is doing her HEP 2x daily, reports increased awareness of her posture.  Current pain scale 4/10 Lt shoulder Pain Assessment Currently in Pain?: Yes Pain Score: 4  Pain Location: Shoulder Pain Orientation: Left  Objective:   Exercise/Treatments Supine Flexion: Left;10 reps;Weights Shoulder Flexion Weight (lbs): 2# wand ABduction: Left;10 reps;Limitations ABduction Limitations: 2# wand Standing Extension: Both;10 reps;Theraband Theraband Level (Shoulder Extension): Level 3 (Green) Row: Both;10 reps;Theraband Theraband Level (Shoulder Row): Level 3 (Green) Retraction: Both;10 reps;Theraband Theraband Level (Shoulder Retraction): Level 3 (Green) Pulleys Flexion: 2 minutes ABduction: 2 minutes  Modalities Modalities: Cryotherapy Manual Therapy Manual Therapy: Joint mobilization Joint Mobilization: left shoulder inferior glides, GH distraction, PROM flexion  Massage: Bil Upper trapz and cervical/scapular region to reduce spasms  Physical Therapy Assessment and Plan PT Assessment and Plan Clinical Impression Statement: Continued postural strengthening exercises, verbal cueing required to relax upper trapezius musculature throughout session.  Began supine AAROM exercises to increase Lt shoulder ROM..  Manual techniques complete to improve glenohumeral joint mobility, increase ROM within pain tolerance and to reduce fascial restricitons in scapular and upper trapezius region. Pt  instructed and given HEP worksheet with upper traps stretches, able to demonstrate appropraite form and able to verbalize appropriate hold time with streches  PT Plan: no electrical stimulation for pain per debrillator , continue with manual and postural strengrthening exercises including shoulder t band exercises    Goals Home Exercise Program Pt/caregiver will Perform Home Exercise Program: Independently PT Short Term Goals Time to Complete Short Term Goals: 3 weeks PT Short Term Goal 1: Patient to improve left shoulder AROM to 150-160 degrees for reaching into cabinets and dressing ADLS  PT Short Term Goal 1 - Progress: Progressing toward goal PT Short Term Goal 2: Patient able to carry 5- 10 pound item using Bilateral UE at waist/chest level without increased pain  PT Short Term Goal 3: Pateint able to use left UE For lower extremity dressing wihtout increased symptoms  PT Short Term Goal 3 - Progress: Progressing toward goal PT Long Term Goals Time to Complete Long Term Goals:  (6 weeks ) PT Long Term Goal 1: Patient able to perform all  UE dressing with max pain level 3/10  PT Long Term Goal 2: Patient to improve FOTO score to greater than 50    Problem List Patient Active Problem List   Diagnosis Date Noted  . Nerve pain 09/18/2013  . Numbness and tingling in hands 08/21/2013  . Neck pain 08/21/2013  . Degenerative cervical disc 08/21/2013  . Cervical spondylosis without myelopathy 08/21/2013  . Left rotator cuff tear 08/21/2013  . Bursitis of shoulder 08/06/2013  . Hyperglycemia without ketosis 06/09/2013  . Diabetes mellitus 06/09/2013  . Peripheral neuropathy 04/26/2011    Class: Chronic  . Mitral regurgitation 03/01/2011  . BACK PAIN 08/15/2010  . CONSTIPATION 05/04/2010  . RECTAL BLEEDING 05/04/2010  . ICD-[employer] 03/01/2010  . GASTROESOPHAGEAL REFLUX DISEASE, HX OF 10/12/2009  . SYSTOLIC HEART FAILURE, CHRONIC 09/15/2009  . CHEST PAIN-PRECORDIAL  09/15/2009  . ANEMIA, IRON DEFICIENCY 07/19/2009  PT - End of Session Activity Tolerance: Patient tolerated treatment well General Behavior During Therapy: [facility] for tasks assessed/performed  GP    Juel BurrowCockerham, Barbara Cunning Jo 10/10/2013, 12:29 PM

## 2013-10-16 ENCOUNTER — Ambulatory Visit (HOSPITAL_COMMUNITY): Payer: Medicare Other | Admitting: Physical Therapy

## 2013-10-17 ENCOUNTER — Telehealth (HOSPITAL_COMMUNITY): Payer: Self-pay

## 2013-10-17 ENCOUNTER — Ambulatory Visit (HOSPITAL_COMMUNITY): Payer: Medicare Other | Admitting: Physical Therapy

## 2013-10-21 ENCOUNTER — Ambulatory Visit (HOSPITAL_COMMUNITY)
Admission: RE | Admit: 2013-10-21 | Discharge: 2013-10-21 | Disposition: A | Discharge status: Home / Self Care | Payer: Medicare Other | Source: Ambulatory Visit | Attending: Orthopedic Surgery | Admitting: Orthopedic Surgery

## 2013-10-21 DIAGNOSIS — E119 Type 2 diabetes mellitus without complications: Secondary | ICD-10-CM | POA: Insufficient documentation

## 2013-10-21 DIAGNOSIS — M542 Cervicalgia: Secondary | ICD-10-CM | POA: Insufficient documentation

## 2013-10-21 DIAGNOSIS — R209 Unspecified disturbances of skin sensation: Secondary | ICD-10-CM | POA: Insufficient documentation

## 2013-10-21 DIAGNOSIS — IMO0001 Reserved for inherently not codable concepts without codable children: Secondary | ICD-10-CM | POA: Insufficient documentation

## 2013-10-21 DIAGNOSIS — M25519 Pain in unspecified shoulder: Secondary | ICD-10-CM | POA: Insufficient documentation

## 2013-10-21 NOTE — Progress Notes (Signed)
Physical Therapy Treatment Patient Details  Name: [name] MRN: [id number] Date of Birth: [birth year]  Today's Date: 10/21/2013 Time: 1022-1111 PT Time Calculation (min): 49 min  Visit#: 7 of 12  Re-eval: 10/24/13 Authorization: Medicare   Authorization Visit#: 7 of 10  Charges:  therex 1478-2956 (12'), traction 1036-1108 (1 unit) with MHP X 1 unit  Subjective: Symptoms/Limitations Symptoms: Pt states her pain is 4/10 today and radiating down her Lt UE.  States no immediate relief thus far from therapy. Pain Assessment Currently in Pain?: Yes Pain Score: 4  Pain Location: Shoulder Pain Orientation: Left   Exercise/Treatments Machines for Strengthening UBE (Upper Arm Bike): 6' 3' fwd/3' bckd Standing Extension: Both;10 reps;Theraband Theraband Level (Shoulder Extension): Level 3 (Green) Row: Both;10 reps;Theraband Theraband Level (Shoulder Row): Level 3 (Green) Retraction: Both;10 reps;Theraband Theraband Level (Shoulder Retraction): Level 3 (Green)     Modalities Modalities: Moist Heat Moist Heat Therapy Number Minutes Moist Heat: 15 Minutes Moist Heat Location:  (cervical with traction) Traction Type of Traction: Cervical Min (lbs): n/a Max (lbs): 14 Hold Time: static Rest Time: n/a Time: 12'   Physical Therapy Assessment and Plan PT Assessment and Plan Clinical Impression Statement: Pt given theraband/written instructions for HEP.  Pt able to demonstrate correctly.  Trial of cervical traction today with MHP.  Pt educated on mechanism of traction/benefits/precautions.  Pt instructed to drink more water today to prevent headache and soreness.  Pt reported immediate pain relief following treatment to 2/10.  PT Plan: no electrical stimulation for pain per defibullator.  Assess effectiveness of traction next visit.  Progress postural strengthening exercises.     Pt forgot theraband/instructions; please give to her next visit.  Problem List Patient Active  Problem List   Diagnosis Date Noted  . Nerve pain 09/18/2013  . Numbness and tingling in hands 08/21/2013  . Neck pain 08/21/2013  . Degenerative cervical disc 08/21/2013  . Cervical spondylosis without myelopathy 08/21/2013  . Left rotator cuff tear 08/21/2013  . Bursitis of shoulder 08/06/2013  . Hyperglycemia without ketosis 06/09/2013  . Diabetes mellitus 06/09/2013  . Peripheral neuropathy 04/26/2011    Class: Chronic  . Mitral regurgitation 03/01/2011  . BACK PAIN 08/15/2010  . CONSTIPATION 05/04/2010  . RECTAL BLEEDING 05/04/2010  . ICD-[employer] 03/01/2010  . GASTROESOPHAGEAL REFLUX DISEASE, HX OF 10/12/2009  . SYSTOLIC HEART FAILURE, CHRONIC 09/15/2009  . CHEST PAIN-PRECORDIAL 09/15/2009  . ANEMIA, IRON DEFICIENCY 07/19/2009    PT - End of Session Activity Tolerance: Patient tolerated treatment well General Behavior During Therapy: WFL for tasks assessed/performed  GP    Lurena Nida, PTA/CLT 10/21/2013, 11:00 AM

## 2013-10-24 ENCOUNTER — Ambulatory Visit (HOSPITAL_COMMUNITY)
Admission: RE | Admit: 2013-10-24 | Discharge: 2013-10-24 | Disposition: A | Discharge status: Home / Self Care | Payer: Medicare Other | Source: Ambulatory Visit | Attending: Family Medicine | Admitting: Family Medicine

## 2013-10-24 DIAGNOSIS — M5030 Other cervical disc degeneration, unspecified cervical region: Secondary | ICD-10-CM

## 2013-10-24 DIAGNOSIS — M75102 Unspecified rotator cuff tear or rupture of left shoulder, not specified as traumatic: Secondary | ICD-10-CM

## 2013-10-24 NOTE — Evaluation (Signed)
Physical Therapy Progress Note   Patient Details  Name: [name] MRN: [id number] Date of Birth: 08-Mar-1966  Today's Date: 10/24/2013 Time: 8032-1224 PT Time Calculation (min): 45 min 0945 -  1010 traction  (15 min + set up )  1010-1030 TE +FOTO            Visit#: 8 of 18  Re-eval: 11/23/13 Assessment Diagnosis: degenerative cervical disease  Next MD Visit: Aline Brochure  11/13/2013  Authorization: Medicare     Authorization Time Period: G code done on 8th visit   Authorization Visit#: 8 of 18   Past Medical History:  Past Medical History  Diagnosis Date  . CHF (congestive heart failure)     chronic systolic CHF,  . Nonischemic cardiomyopathy     EF 25%  . Mitral regurgitation     moderate to severe  . S/P cardiac catheterization 10/06    normal coronary arteries  . LBBB (left bundle branch block)   . HTN (hypertension)   . Asthma   . IBS (irritable bowel syndrome)     with primarily constipation  . Morbid obesity   . GERD (gastroesophageal reflux disease)   . IDA (iron deficiency anemia)     2o TO SB AVMS, Hx parenteral iron Dr Tressie Stalker  . AVM (arteriovenous malformation)     Small bowel, s/p duble balloon enteroscopy/APC jejunum Dr Arsenio Loader [facility]) 01/23/2011  . Anemia, chronic disease   . Peripheral neuropathy   . OSA (obstructive sleep apnea)     on CPAP qhs  . GI bleed 06/2009    Hgb 7.5, ferritin 7, transfusion, iron IV  . Dyspnea on exertion     chronic  . Diabetes mellitus without complication    Past Surgical History:  Past Surgical History  Procedure Laterality Date  . Crdt-implantation  4/07    [employer]. remote- yes  . Appendectomy    . Cholecystectomy      biliary dyskinesia  . Mastectomy  2003    left, partial  . Knee arthroscopy  2005    left  . Small bowel enteroscopy  MAY 2012 [facility] DR. GILLIAM    SB AVMS s/p APC  . Colonoscopy  OCT 2010/SEP 2011    tortuous colon, 3 polyps-benign(2010),   . Upper gastrointestinal endoscopy   '08, '10, SEP 2011    mild antral gastritis (2010), negative SB bx (2010), incomlpete Schatzki's ring (2011)  . Small bowel enteroscopy  SEP 2011 [facility]    Fields-NO AVMS  . Givens capsule study  NOV 2010     NO AVMS, normal  . Irrigation and debridement abscess  06/25/2012    Procedure: MINOR INCISION AND DRAINAGE OF ABSCESS;  Surgeon: Scherry Ran, MD;  Location: [facility];  Service: General;  Laterality: [name];  Incision & Drainage of Infected Sebaceous Cyst on Chest  . Breast surgery    . Cardiac defibrillator placement     Subjective Symptoms/Limitations Symptoms: 2/10 left cervical pain, temp pain relief for 3-4 hours after traction cervical spine last session, states therapy helpful for decreasing pain levels , numbnes sensation in left hand reported as lessened post cervical traction  traction visits  Pertinent History: per arthogram results no RTC tear  Special Tests:  CT scan neck: DDD in chart  Patient Stated Goals: decrease pain  Pain Type: Chronic pain Precautions/Restrictions  Precautions: ICD/Pacemaker    Cognition/Observation Observation/Other Assessments Observations: forward head posture, elevated B scapula    Assessment LUE AROM (degrees) Left  Shoulder Flexion: 150 Degrees  ( 140 prior)  Left Shoulder ABduction: 150 Degrees LUE Strength Left Shoulder Flexion: 4/5  93/5 prior)  Cervical AROM Cervical Flexion: 25  Cervical - Right Rotation: 60  Cervical - Left Rotation: 60  Palpation Palpation: mild tenderness left upper and middle trapezius   Exercise/Treatments Mobility/Balance  Posture/Postural Control Posture/Postural Control: Postural limitations Postural Limitations: forward head , thoracic kyphosis, B scap elevation   Todays treatment  Stretches Upper Trapezius Stretch: 2 reps;30 seconds, verbal and tactile cues     Standing Exercises Neck Retraction: 10 reps Wall Wash: 10 reps  Other Standing Exercises: scapular retraction 10x, ball  wall flexion 15x  Other Standing Exercises: neck retractions at wall ( ball behind head)   with AROM B shoulder EXt rotation 10x  Seated Exercises Neck Retraction: 10 reps Cervical Rotation: 5 reps Modalities Modalities: Moist Heat;Traction Traction Type of Traction: Cervical Max (lbs): 15  Hold Time: static Time: 15 min   Physical Therapy Assessment and Plan PT Assessment and Plan Clinical Impression Statement: symptoms relief during static cervical traction, improved left shoulder AROM flexion, further skilled rehab to continue per plan with addition of cervical traction ( added past 2 visits with positive response)  Rehab Potential: Good Clinical Impairments Affecting Rehab Potential: significiant postural deficits, good response to 2 cervical traction visits, chroninc neck and shoulder pain  PT Frequency: Min 3X/week PT Duration: 6 weeks   ( 8 of 18 visits completed)  Continue 10 more visits  PT Treatment/Interventions: Therapeutic activities;Therapeutic exercise;Neuromuscular re-education;Patient/family education;Manual techniques;continue cervical mechanical and manual  Traction, ultrasound   PT Plan: no electrical stimulation for pain per defibullator.   continue per plan 3 times a week , continue cervical  traction next visit.  Progress postural strengthening exercises.      Goals PT Short Term Goals Time to Complete Short Term Goals: 3 weeks PT Short Term Goal 1: Patient to improve left shoulder AROM to 150-160 degrees for reaching into cabinets and dressing ADLS  PT Short Term Goal 1 - Progress: Progressing toward goal PT Short Term Goal 2: Patient able to carry 5- 10 pound item using Bilateral UE at waist/chest level without increased pain  PT Short Term Goal 2 - Progress: Progressing toward goal PT Short Term Goal 3: Pateint able to use left UE For lower extremity dressing wihtout increased symptoms  PT Short Term Goal 3 - Progress: Partly met PT Long Term Goals PT Long  Term Goal 1: Patient able to perform all  UE dressing with max pain level 3/10  PT Long Term Goal 1 - Progress: Progressing toward goal PT Long Term Goal 2: Patient to improve FOTO score to greater than 50   PT Long Term Goal 2 - Progress: Not met  Problem List Patient Active Problem List   Diagnosis Date Noted  . Nerve pain 09/18/2013  . Numbness and tingling in hands 08/21/2013  . Neck pain 08/21/2013  . Degenerative cervical disc 08/21/2013  . Cervical spondylosis without myelopathy 08/21/2013  . Left rotator cuff tear 08/21/2013  . Bursitis of shoulder 08/06/2013  . Hyperglycemia without ketosis 06/09/2013  . Diabetes mellitus 06/09/2013  . Peripheral neuropathy 04/26/2011    Class: Chronic  . Mitral regurgitation 03/01/2011  . BACK PAIN 08/15/2010  . CONSTIPATION 05/04/2010  . RECTAL BLEEDING 05/04/2010  . ICD-[employer] 03/01/2010  . GASTROESOPHAGEAL REFLUX DISEASE, HX OF 10/12/2009  . SYSTOLIC HEART FAILURE, CHRONIC 09/15/2009  . CHEST PAIN-PRECORDIAL 09/15/2009  . ANEMIA, IRON DEFICIENCY  07/19/2009    General Behavior During Therapy: WFL for tasks assessed/performed PT Plan of Care PT Patient Instructions: t band written provided   GP Functional Assessment Tool Used: FOTO     score today 41 ( 40 prior score)  Functional Limitation: Other PT primary Other PT Primary Current Status (K2217): At least 40 percent but less than 60 percent impaired, limited or restricted Other PT Primary Goal Status (V8102): At least 20 percent but less than 40 percent impaired, limited or restricted  Clancy Leiner 10/24/2013, 12:17 PM  Physician Documentation Your signature is required to indicate approval of the treatment plan as stated above.  Please sign and either send electronically or make a copy of this report for your files and return this physician signed original.   Please mark one 1.__approve of plan  2. ___approve of plan with the following  conditions.   ______________________________                                                          _____________________ Physician Signature                                                                                                             Date

## 2013-10-28 ENCOUNTER — Ambulatory Visit (HOSPITAL_COMMUNITY): Payer: Medicare Other

## 2013-10-31 ENCOUNTER — Ambulatory Visit (HOSPITAL_COMMUNITY): Payer: Medicare Other | Admitting: Physical Therapy

## 2013-11-03 ENCOUNTER — Telehealth: Payer: Self-pay | Admitting: Internal Medicine

## 2013-11-03 DIAGNOSIS — I5022 Chronic systolic (congestive) heart failure: Secondary | ICD-10-CM

## 2013-11-03 MED ORDER — CARVEDILOL 25 MG PO TABS: 12.5000 mg | ORAL_TABLET | Freq: Two times a day (BID) | ORAL | Status: DC

## 2013-11-03 NOTE — Telephone Encounter (Signed)
Refill request complete 

## 2013-11-03 NOTE — Telephone Encounter (Signed)
Received fax refill request  Rx # [id number] Medication:  Carvedilol 25 mg tablet Qty 30 Sig:  Take 1/2 tablet by mouth 2 times a day Physician:  [name]

## 2013-11-04 ENCOUNTER — Ambulatory Visit (HOSPITAL_COMMUNITY): Payer: Medicare Other

## 2013-11-10 ENCOUNTER — Inpatient Hospital Stay (HOSPITAL_COMMUNITY): Admission: RE | Admit: 2013-11-10 | Payer: Medicare Other | Source: Ambulatory Visit

## 2013-11-13 ENCOUNTER — Ambulatory Visit: Payer: Medicare Other | Admitting: Orthopedic Surgery

## 2013-11-13 ENCOUNTER — Encounter: Payer: Self-pay | Admitting: Orthopedic Surgery

## 2013-12-04 ENCOUNTER — Ambulatory Visit (INDEPENDENT_AMBULATORY_CARE_PROVIDER_SITE_OTHER): Payer: Medicare Other | Admitting: Obstetrics and Gynecology

## 2013-12-04 ENCOUNTER — Encounter: Payer: Self-pay | Admitting: Obstetrics and Gynecology

## 2013-12-04 VITALS — BP 110/68 | Ht 65.0 in | Wt 272.2 lb

## 2013-12-04 DIAGNOSIS — Z3046 Encounter for surveillance of implantable subdermal contraceptive: Secondary | ICD-10-CM

## 2013-12-04 NOTE — Progress Notes (Signed)
This chart was scribed by Leone Payor, Medical Scribe, for Dr. Christin Bach on 12/04/13 at 11:09 AM. This chart was reviewed by Dr. Christin Bach and is accurate.    HPI: Patient states she was supposed to have the Implanon removed 2 years ago. She reports wanting to have the next one inserted in the right arm due to musculoskeletal pain in the left arm.  Pt will be going to health dept for reinsertion rt arm   Patient given informed consent for removal of her Implanon, time out was performed.  Signed copy in the chart.  Appropriate time out taken. Implanon site identified.  Area prepped in usual sterile fashon. One cc of 2% lidocaine was used to anesthetize the area at the distal end of the implant. A small stab incision was made right beside the implant on the distal portion.  The implanon rod was grasped using hemostats and removed without difficulty.  There was less than 3 cc blood loss. There were no complications.  A small amount of antibiotic ointment and steri-strips were applied over the small incision.  A pressure bandage was applied to reduce any bruising.  The patient tolerated the procedure well and was given post procedure instructions.   fsh TO BE DRAWN IN ONE WEEK TO SEE IF PT IS POSTMENOPAUSAL.she has not had a menses in 6 yrs. Barrier method planned for the present/interim

## 2013-12-08 ENCOUNTER — Encounter (HOSPITAL_COMMUNITY): Payer: Self-pay | Admitting: Emergency Medicine

## 2013-12-08 ENCOUNTER — Emergency Department (HOSPITAL_COMMUNITY): Payer: Medicare Other

## 2013-12-08 ENCOUNTER — Inpatient Hospital Stay (HOSPITAL_COMMUNITY)
Admission: EM | Admit: 2013-12-08 | Discharge: 2013-12-16 | DRG: 287 | Disposition: A | Discharge status: Home Health | Payer: Medicare Other | Attending: Cardiology | Admitting: Cardiology

## 2013-12-08 DIAGNOSIS — IMO0001 Reserved for inherently not codable concepts without codable children: Secondary | ICD-10-CM | POA: Diagnosis present

## 2013-12-08 DIAGNOSIS — K219 Gastro-esophageal reflux disease without esophagitis: Secondary | ICD-10-CM | POA: Diagnosis present

## 2013-12-08 DIAGNOSIS — Z87891 Personal history of nicotine dependence: Secondary | ICD-10-CM

## 2013-12-08 DIAGNOSIS — I059 Rheumatic mitral valve disease, unspecified: Secondary | ICD-10-CM | POA: Diagnosis present

## 2013-12-08 DIAGNOSIS — I5022 Chronic systolic (congestive) heart failure: Secondary | ICD-10-CM | POA: Diagnosis present

## 2013-12-08 DIAGNOSIS — Z8049 Family history of malignant neoplasm of other genital organs: Secondary | ICD-10-CM

## 2013-12-08 DIAGNOSIS — Z95810 Presence of automatic (implantable) cardiac defibrillator: Secondary | ICD-10-CM

## 2013-12-08 DIAGNOSIS — J45909 Unspecified asthma, uncomplicated: Secondary | ICD-10-CM | POA: Diagnosis present

## 2013-12-08 DIAGNOSIS — M5030 Other cervical disc degeneration, unspecified cervical region: Secondary | ICD-10-CM

## 2013-12-08 DIAGNOSIS — R1310 Dysphagia, unspecified: Secondary | ICD-10-CM | POA: Diagnosis present

## 2013-12-08 DIAGNOSIS — I10 Essential (primary) hypertension: Secondary | ICD-10-CM | POA: Diagnosis present

## 2013-12-08 DIAGNOSIS — G4733 Obstructive sleep apnea (adult) (pediatric): Secondary | ICD-10-CM | POA: Diagnosis present

## 2013-12-08 DIAGNOSIS — I2789 Other specified pulmonary heart diseases: Secondary | ICD-10-CM | POA: Diagnosis present

## 2013-12-08 DIAGNOSIS — E119 Type 2 diabetes mellitus without complications: Secondary | ICD-10-CM

## 2013-12-08 DIAGNOSIS — I509 Heart failure, unspecified: Secondary | ICD-10-CM | POA: Diagnosis present

## 2013-12-08 DIAGNOSIS — Z8249 Family history of ischemic heart disease and other diseases of the circulatory system: Secondary | ICD-10-CM

## 2013-12-08 DIAGNOSIS — Z888 Allergy status to other drugs, medicaments and biological substances status: Secondary | ICD-10-CM

## 2013-12-08 DIAGNOSIS — I208 Other forms of angina pectoris: Secondary | ICD-10-CM | POA: Diagnosis present

## 2013-12-08 DIAGNOSIS — R0600 Dyspnea, unspecified: Secondary | ICD-10-CM

## 2013-12-08 DIAGNOSIS — E1165 Type 2 diabetes mellitus with hyperglycemia: Secondary | ICD-10-CM

## 2013-12-08 DIAGNOSIS — R0989 Other specified symptoms and signs involving the circulatory and respiratory systems: Secondary | ICD-10-CM

## 2013-12-08 DIAGNOSIS — I428 Other cardiomyopathies: Secondary | ICD-10-CM | POA: Diagnosis present

## 2013-12-08 DIAGNOSIS — G609 Hereditary and idiopathic neuropathy, unspecified: Secondary | ICD-10-CM | POA: Diagnosis present

## 2013-12-08 DIAGNOSIS — R0609 Other forms of dyspnea: Secondary | ICD-10-CM

## 2013-12-08 DIAGNOSIS — R079 Chest pain, unspecified: Secondary | ICD-10-CM

## 2013-12-08 DIAGNOSIS — I5023 Acute on chronic systolic (congestive) heart failure: Principal | ICD-10-CM

## 2013-12-08 DIAGNOSIS — Z794 Long term (current) use of insulin: Secondary | ICD-10-CM

## 2013-12-08 DIAGNOSIS — E876 Hypokalemia: Secondary | ICD-10-CM | POA: Diagnosis present

## 2013-12-08 DIAGNOSIS — Z6841 Body Mass Index (BMI) 40.0 and over, adult: Secondary | ICD-10-CM

## 2013-12-08 DIAGNOSIS — E1129 Type 2 diabetes mellitus with other diabetic kidney complication: Secondary | ICD-10-CM | POA: Diagnosis present

## 2013-12-08 LAB — CBC WITH DIFFERENTIAL/PLATELET
Basophils Absolute: 0 10*3/uL (ref 0.0–0.1)
Basophils Relative: 0 % (ref 0–1)
EOS ABS: 0.1 10*3/uL (ref 0.0–0.7)
Eosinophils Relative: 2 % (ref 0–5)
HEMATOCRIT: 37.2 % (ref 36.0–46.0)
HEMOGLOBIN: 12.1 g/dL (ref 12.0–15.0)
LYMPHS ABS: 1.9 10*3/uL (ref 0.7–4.0)
Lymphocytes Relative: 31 % (ref 12–46)
MCH: 28.1 pg (ref 26.0–34.0)
MCHC: 32.5 g/dL (ref 30.0–36.0)
MCV: 86.5 fL (ref 78.0–100.0)
MONO ABS: 0.2 10*3/uL (ref 0.1–1.0)
MONOS PCT: 4 % (ref 3–12)
NEUTROS PCT: 63 % (ref 43–77)
Neutro Abs: 3.9 10*3/uL (ref 1.7–7.7)
Platelets: 251 10*3/uL (ref 150–400)
RBC: 4.3 MIL/uL (ref 3.87–5.11)
RDW: 13.5 % (ref 11.5–15.5)
WBC: 6.1 10*3/uL (ref 4.0–10.5)

## 2013-12-08 LAB — BASIC METABOLIC PANEL
BUN: 13 mg/dL (ref 6–23)
CHLORIDE: 88 meq/L — AB (ref 96–112)
CO2: 38 mEq/L — ABNORMAL HIGH (ref 19–32)
CREATININE: 0.83 mg/dL (ref 0.50–1.10)
Calcium: 8.7 mg/dL (ref 8.4–10.5)
GFR calc Af Amer: >90 mL/min (ref >90)
GFR calc non Af Amer: 83 mL/min — ABNORMAL LOW (ref >90)
GLUCOSE: 286 mg/dL — AB (ref 70–99)
Potassium: 3.1 mEq/L — ABNORMAL LOW (ref 3.7–5.3)
Sodium: 137 mEq/L (ref 137–147)

## 2013-12-08 LAB — GLUCOSE, CAPILLARY
Glucose-Capillary: 148 mg/dL — ABNORMAL HIGH (ref 70–99)
Glucose-Capillary: 258 mg/dL — ABNORMAL HIGH (ref 70–99)

## 2013-12-08 LAB — D-DIMER, QUANTITATIVE (NOT AT ARMC): D-Dimer, Quant: 0.47 ug/mL-FEU (ref 0.00–0.48)

## 2013-12-08 LAB — PRO B NATRIURETIC PEPTIDE: Pro B Natriuretic peptide (BNP): 948.6 pg/mL — ABNORMAL HIGH (ref 0–125)

## 2013-12-08 LAB — TROPONIN I: Troponin I: <0.3 ng/mL (ref <0.30)

## 2013-12-08 LAB — CBG MONITORING, ED: Glucose-Capillary: 189 mg/dL — ABNORMAL HIGH (ref 70–99)

## 2013-12-08 MED ORDER — ACETAMINOPHEN 325 MG PO TABS: 650.0000 mg | ORAL_TABLET | Freq: Four times a day (QID) | ORAL | Status: DC | PRN

## 2013-12-08 MED ORDER — ENOXAPARIN SODIUM 40 MG/0.4ML ~~LOC~~ SOLN
40.0000 mg | SUBCUTANEOUS | Status: DC
  Administered 2013-12-08 – 2013-12-10 (×3): 40 mg via SUBCUTANEOUS
  Filled 2013-12-08 (×4): qty 0.4

## 2013-12-08 MED ORDER — ALBUTEROL SULFATE (2.5 MG/3ML) 0.083% IN NEBU
5.0000 mg | INHALATION_SOLUTION | Freq: Once | RESPIRATORY_TRACT | Status: AC
  Administered 2013-12-08: 5 mg via RESPIRATORY_TRACT
  Filled 2013-12-08: qty 6

## 2013-12-08 MED ORDER — TEMAZEPAM 15 MG PO CAPS
15.0000 mg | ORAL_CAPSULE | Freq: Every evening | ORAL | Status: DC | PRN
  Administered 2013-12-08 – 2013-12-15 (×5): 15 mg via ORAL
  Filled 2013-12-08 (×5): qty 1

## 2013-12-08 MED ORDER — HYDROCODONE-ACETAMINOPHEN 10-325 MG PO TABS
1.0000 | ORAL_TABLET | ORAL | Status: DC | PRN
  Administered 2013-12-08 – 2013-12-14 (×11): 1 via ORAL
  Filled 2013-12-08 (×13): qty 1

## 2013-12-08 MED ORDER — ALBUTEROL SULFATE (2.5 MG/3ML) 0.083% IN NEBU
2.5000 mg | INHALATION_SOLUTION | RESPIRATORY_TRACT | Status: DC | PRN
  Administered 2013-12-09 – 2013-12-10 (×2): 2.5 mg via RESPIRATORY_TRACT
  Filled 2013-12-08 (×2): qty 3

## 2013-12-08 MED ORDER — ACETAMINOPHEN 650 MG RE SUPP: 650.0000 mg | Freq: Four times a day (QID) | RECTAL | Status: DC | PRN

## 2013-12-08 MED ORDER — PANTOPRAZOLE SODIUM 40 MG PO TBEC
40.0000 mg | DELAYED_RELEASE_TABLET | Freq: Every day | ORAL | Status: DC
  Administered 2013-12-08 – 2013-12-16 (×8): 40 mg via ORAL
  Filled 2013-12-08 (×8): qty 1

## 2013-12-08 MED ORDER — FUROSEMIDE 10 MG/ML IJ SOLN
40.0000 mg | Freq: Three times a day (TID) | INTRAMUSCULAR | Status: DC
  Administered 2013-12-08 – 2013-12-09 (×2): 40 mg via INTRAVENOUS
  Filled 2013-12-08 (×2): qty 4

## 2013-12-08 MED ORDER — INSULIN ASPART 100 UNIT/ML ~~LOC~~ SOLN
0.0000 [IU] | Freq: Three times a day (TID) | SUBCUTANEOUS | Status: DC
  Administered 2013-12-08 – 2013-12-09 (×2): 3 [IU] via SUBCUTANEOUS
  Administered 2013-12-09 – 2013-12-10 (×4): 5 [IU] via SUBCUTANEOUS
  Administered 2013-12-10: 3 [IU] via SUBCUTANEOUS
  Administered 2013-12-11: 5 [IU] via SUBCUTANEOUS
  Administered 2013-12-11: 4 [IU] via SUBCUTANEOUS
  Administered 2013-12-11 – 2013-12-13 (×2): 5 [IU] via SUBCUTANEOUS
  Administered 2013-12-13: 17:00:00 via SUBCUTANEOUS
  Administered 2013-12-13: 2 [IU] via SUBCUTANEOUS
  Administered 2013-12-14 (×3): 3 [IU] via SUBCUTANEOUS
  Administered 2013-12-15: 2 [IU] via SUBCUTANEOUS
  Administered 2013-12-15 (×2): 3 [IU] via SUBCUTANEOUS
  Administered 2013-12-16: 2 [IU] via SUBCUTANEOUS

## 2013-12-08 MED ORDER — CARVEDILOL 12.5 MG PO TABS
12.5000 mg | ORAL_TABLET | Freq: Two times a day (BID) | ORAL | Status: DC
  Administered 2013-12-08 – 2013-12-11 (×7): 12.5 mg via ORAL
  Filled 2013-12-08 (×9): qty 1

## 2013-12-08 MED ORDER — ONDANSETRON HCL 4 MG PO TABS
4.0000 mg | ORAL_TABLET | Freq: Four times a day (QID) | ORAL | Status: DC | PRN
  Administered 2013-12-10: 4 mg via ORAL
  Filled 2013-12-08: qty 1

## 2013-12-08 MED ORDER — ONDANSETRON HCL 4 MG/2ML IJ SOLN
4.0000 mg | Freq: Four times a day (QID) | INTRAMUSCULAR | Status: DC | PRN
  Administered 2013-12-11: 4 mg via INTRAVENOUS
  Filled 2013-12-08: qty 2

## 2013-12-08 MED ORDER — POTASSIUM CHLORIDE CRYS ER 20 MEQ PO TBCR
40.0000 meq | EXTENDED_RELEASE_TABLET | Freq: Two times a day (BID) | ORAL | Status: DC
  Administered 2013-12-08 – 2013-12-16 (×15): 40 meq via ORAL
  Filled 2013-12-08 (×20): qty 2

## 2013-12-08 MED ORDER — GABAPENTIN 300 MG PO CAPS
300.0000 mg | ORAL_CAPSULE | Freq: Two times a day (BID) | ORAL | Status: DC
  Administered 2013-12-09 – 2013-12-16 (×13): 300 mg via ORAL
  Filled 2013-12-08 (×17): qty 1

## 2013-12-08 MED ORDER — FUROSEMIDE 10 MG/ML IJ SOLN
80.0000 mg | Freq: Once | INTRAMUSCULAR | Status: DC
  Administered 2013-12-08: 80 mg via INTRAVENOUS
  Filled 2013-12-08: qty 8

## 2013-12-08 MED ORDER — DOCUSATE SODIUM 100 MG PO CAPS
100.0000 mg | ORAL_CAPSULE | Freq: Every day | ORAL | Status: DC
  Administered 2013-12-08 – 2013-12-16 (×8): 100 mg via ORAL
  Filled 2013-12-08 (×9): qty 1

## 2013-12-08 MED ORDER — INSULIN DETEMIR 100 UNIT/ML ~~LOC~~ SOLN
80.0000 [IU] | Freq: Every day | SUBCUTANEOUS | Status: DC
  Administered 2013-12-08: 80 [IU] via SUBCUTANEOUS
  Filled 2013-12-08: qty 0.8

## 2013-12-08 MED ORDER — HYDRALAZINE HCL 25 MG PO TABS
50.0000 mg | ORAL_TABLET | Freq: Three times a day (TID) | ORAL | Status: DC
  Administered 2013-12-08 – 2013-12-10 (×5): 50 mg via ORAL
  Filled 2013-12-08 (×6): qty 2

## 2013-12-08 MED ORDER — CYCLOBENZAPRINE HCL 10 MG PO TABS
10.0000 mg | ORAL_TABLET | Freq: Three times a day (TID) | ORAL | Status: DC | PRN
  Administered 2013-12-10 – 2013-12-16 (×3): 10 mg via ORAL
  Filled 2013-12-08 (×3): qty 1

## 2013-12-08 MED ORDER — BENAZEPRIL HCL 40 MG PO TABS
40.0000 mg | ORAL_TABLET | Freq: Every day | ORAL | Status: DC
  Administered 2013-12-08 – 2013-12-11 (×4): 40 mg via ORAL
  Filled 2013-12-08: qty 4
  Filled 2013-12-08: qty 1
  Filled 2013-12-08 (×3): qty 4

## 2013-12-08 MED ORDER — FERROUS SULFATE 325 (65 FE) MG PO TABS
325.0000 mg | ORAL_TABLET | Freq: Three times a day (TID) | ORAL | Status: DC
  Administered 2013-12-08 – 2013-12-16 (×22): 325 mg via ORAL
  Filled 2013-12-08 (×27): qty 1

## 2013-12-08 MED ORDER — ISOSORBIDE MONONITRATE ER 60 MG PO TB24
60.0000 mg | ORAL_TABLET | Freq: Every day | ORAL | Status: DC
  Administered 2013-12-08 – 2013-12-10 (×3): 60 mg via ORAL
  Filled 2013-12-08 (×3): qty 1

## 2013-12-08 NOTE — ED Notes (Signed)
Pt c/o productive cough with frothy sputum since Friday.  C/O soreness in abd and chest.  Reports has been using nebulizers at home and increased fluid medication, but no relief.

## 2013-12-08 NOTE — H&P (Addendum)
[facility] History and Physical  Barbara Ramalamela D Forgey ZOX:[id number] DOB: 1965/11/22 DOA: 12/08/2013  Referring physician: EDP PCP: Milana ObeyKNOWLTON,STEPHEN D, MD   Chief Complaint: Shortness of breath  HPI: [name] is a 48 y.o. female with PMH of Chronic systolic CHF/NICM EF of 25%, s/p ICD, DM, HTN, Obesity, L arm pain presents to the ER with the above complaint. She reports progressive dyspnea on exertion with some orthopnea, since Friday. Took some extra Diuretic and used her nebulizers this weekend without much relief  She denies lower extremity swelling but reports that her feet rarely swell even when she has extra fluid on board and its her abdomen which gets more swollen and she has noticed that recently. Reports compliance with her medications and diet. Also reports a mild cough, with clear sputum, no fevers. Presented to the ER today and [facility] were consulted for further management     Review of Systems:  Constitutional:  No weight loss, night sweats, Fevers, chills, fatigue.  HEENT:  No headaches, Difficulty swallowing,Tooth/dental problems,Sore throat,  No sneezing, itching, ear ache, nasal congestion, post nasal drip,  Cardio-vascular:  No chest pain, Orthopnea, PND, swelling in lower extremities, anasarca, dizziness, palpitations  GI:  No heartburn, indigestion, abdominal pain, nausea, vomiting, diarrhea, change in bowel habits, loss of appetite  Resp:  No shortness of breath with exertion or at rest. No excess mucus, no productive cough, No non-productive cough, No coughing up of blood.No change in color of mucus.No wheezing.No chest wall deformity  Skin:  no rash or lesions.  GU:  no dysuria, change in color of urine, no urgency or frequency. No flank pain.  Musculoskeletal:  No joint pain or swelling. No decreased range of motion. No back pain.  Psych:  No change in mood or affect. No depression or anxiety. No memory loss.   Past Medical History  Diagnosis  Date  . CHF (congestive heart failure)     chronic systolic CHF,  . Nonischemic cardiomyopathy     EF 25%  . Mitral regurgitation     moderate to severe  . S/P cardiac catheterization 10/06    normal coronary arteries  . LBBB (left bundle branch block)   . HTN (hypertension)   . Asthma   . IBS (irritable bowel syndrome)     with primarily constipation  . Morbid obesity   . GERD (gastroesophageal reflux disease)   . IDA (iron deficiency anemia)     2o TO SB AVMS, Hx parenteral iron Dr Mariel SleetNeijstrom  . AVM (arteriovenous malformation)     Small bowel, s/p duble balloon enteroscopy/APC jejunum Dr [facility] 01/23/2011  . Anemia, chronic disease   . Peripheral neuropathy   . OSA (obstructive sleep apnea)     on CPAP qhs  . GI bleed 06/2009    Hgb 7.5, ferritin 7, transfusion, iron IV  . Dyspnea on exertion     chronic  . Diabetes mellitus without complication    Past Surgical History  Procedure Laterality Date  . Crdt-implantation  4/07    [employer]. remote- yes  . Appendectomy    . Cholecystectomy      biliary dyskinesia  . Mastectomy  2003    left, partial  . Knee arthroscopy  2005    left  . Small bowel enteroscopy  [facility]Y [facility] DR. GILLIAM    SB AVMS s/p APC  . Colonoscopy  OCT 2010/SEP 2011    tortuous colon, 3 polyps-benign(2010),   . Upper gastrointestinal  endoscopy  '08, '10, SEP 2011    mild antral gastritis (2010), negative SB bx (2010), incomlpete Schatzki's ring (2011)  . Small bowel enteroscopy  SEP 2011 [facility]    Fields-NO AVMS  . Givens capsule study  NOV 2010     NO AVMS, normal  . Irrigation and debridement abscess  06/25/2012    Procedure: MINOR INCISION AND DRAINAGE OF ABSCESS;  Surgeon: [name], MD;  Location: [facility];  Service: General;  Laterality: [name];  Incision & Drainage of Infected Sebaceous Cyst on Chest  . Breast surgery    . Cardiac defibrillator placement     Social History:  reports that she quit smoking  about 4 years ago. Her smoking use included Cigarettes. She has a 10 pack-year smoking history. She has never used smokeless tobacco. She reports that she does not drink alcohol or use illicit drugs.  Allergies  Allergen Reactions  . Cymbalta [Duloxetine Hcl]     "Spontaneous type behavior"  . Lyrica [Pregabalin] Swelling  . Trazodone And Nefazodone Other (See Comments)    Nightmares     Family History  Problem Relation Age of Onset  . Colon cancer Neg Hx     no family Hx of polyps too, uncle  . Cervical cancer Mother   . Hypertension Mother   . Heart disease Father   . Hypertension Father   . GER disease Father      Prior to Admission medications   Medication Sig Start Date End Date Taking? Authorizing Provider  albuterol (PROVENTIL) (5 MG/ML) 0.5% nebulizer solution Take 0.5 mLs (2.5 mg total) by nebulization every 4 (four) hours as needed for wheezing or shortness of breath. 11/21/12  Yes [name], MD  albuterol (VENTOLIN HFA) 108 (90 BASE) MCG/ACT inhaler Inhale 2 puffs into the lungs every 6 (six) hours as needed. For shortness of breath   Yes Historical Provider, MD  benazepril (LOTENSIN) 40 MG tablet Take 40 mg by mouth daily.    Yes Historical Provider, MD  carvedilol (COREG) 25 MG tablet Take 0.5 tablets (12.5 mg total) by mouth 2 (two) times daily. 11/03/13  Yes Marinus Maw, MD  cetirizine (ZYRTEC) 10 MG tablet Take 10 mg by mouth daily as needed for allergies.    Yes Historical Provider, MD  docusate sodium (STOOL SOFTENER) 100 MG capsule Take 100 mg by mouth daily.    Yes Historical Provider, MD  Etonogestrel (IMPLANON [place]) Inject 1 each into the skin once. Gets every 3 years   Yes Historical Provider, MD  ferrous sulfate 325 (65 FE) MG tablet Take 325 mg by mouth 3 (three) times daily.   Yes Historical Provider, MD  hydrALAZINE (APRESOLINE) 100 MG tablet Take 100 mg by mouth 3 (three) times daily. For heart failure   Yes Historical Provider, MD    HYDROcodone-acetaminophen (NORCO) 10-325 MG per tablet Take 1 tablet by mouth every 4 (four) hours as needed for moderate pain.  11/11/13  Yes Historical Provider, MD  insulin aspart (NOVOLOG) 100 UNIT/ML injection Inject 0-20 Units into the skin 3 (three) times daily with meals. 06/28/12  Yes [name], MD  insulin detemir (LEVEMIR) 100 UNIT/ML injection Inject 80 Units into the skin at bedtime.   Yes Historical Provider, MD  isosorbide mononitrate (IMDUR) 60 MG 24 hr tablet Take 60 mg by mouth 3 (three) times daily. For heart failure   Yes Historical Provider, MD  omeprazole (PRILOSEC) 40 MG capsule Take 1 capsule (40 mg  total) by mouth daily. 03/27/13  Yes Marinus Maw, MD  potassium chloride SA (K-DUR,KLOR-CON) 20 MEQ tablet Take 20 mEq by mouth 5 (five) times daily.    Yes Historical Provider, MD  Temazepam (RESTORIL PO) Take 20 mg by mouth at bedtime.   Yes Historical Provider, MD  torsemide (DEMADEX) 20 MG tablet Take 2 tablets (40 mg total) by mouth daily. For heart failure 03/25/13  Yes Gaylord Shih, MD  vitamin E 400 UNIT capsule Take 400 Units by mouth daily.   Yes Historical Provider, MD  cyclobenzaprine (FLEXERIL) 10 MG tablet Take 10 mg by mouth 3 (three) times daily as needed. For muscle spasms    Historical Provider, MD  gabapentin (NEURONTIN) 300 MG capsule Take two tablets four times a day 09/18/13   Vickki Hearing, MD   Physical Exam: Filed Vitals:   12/08/13 1300  BP: 105/76  Pulse: 88  Temp:   Resp:     BP 105/76  Pulse 88  Temp(Src) 98.4 F (36.9 C) (Oral)  Resp 18  Ht 5\' 5"  (1.651 m)  Wt 123.832 kg (273 lb)  BMI 45.43 kg/m2  SpO2 94%  General:  AAOx3, no distress, morbidly obese Eyes: PERRL, normal lids, irises & conjunctiva ENT: grossly normal hearing, lips & tongue Neck: no LAD, masses or thyromegaly Cardiovascular: RRR, no m/r/g. No LE edema. Telemetry: SR, no arrhythmias  Respiratory: diminished at bases Abdomen: soft, obese, Nt, slightly  distended, BS present Skin: no rash or induration seen on limited exam Musculoskeletal: no edema c/c, thick dry skin of lower extremities Psychiatric: grossly normal mood and affect, speech fluent and appropriate Neurologic: grossly non-focal.          Labs on Admission:  Basic Metabolic Panel:  Recent Labs Lab 12/08/13 1028  NA 137  K 3.1*  CL 88*  CO2 38*  GLUCOSE 286*  BUN 13  CREATININE 0.83  CALCIUM 8.7   Liver Function Tests: No results found for this basename: AST, ALT, ALKPHOS, BILITOT, PROT, ALBUMIN,  in the last 168 hours No results found for this basename: LIPASE, AMYLASE,  in the last 168 hours No results found for this basename: AMMONIA,  in the last 168 hours CBC:  Recent Labs Lab 12/08/13 1028  WBC 6.1  NEUTROABS 3.9  HGB 12.1  HCT 37.2  MCV 86.5  PLT 251   Cardiac Enzymes:  Recent Labs Lab 12/08/13 1028  TROPONINI <0.30    BNP (last 3 results)  Recent Labs  04/06/13 1549 06/10/13 0954 12/08/13 1028  PROBNP 612.1* 508.1* 948.6*   CBG:  Recent Labs Lab 12/08/13 1239  GLUCAP 189*    Radiological Exams on Admission: Dg Chest 2 View  12/08/2013   CLINICAL DATA:  Cough and congestion.  EXAM: CHEST  2 VIEW  COMPARISON:  Single view of the chest 11/19/2012. PA and lateral chest 09/12/2010.  FINDINGS: AICD remains in place. There is marked cardiomegaly without edema. Lungs are clear. No pneumothorax or pleural fluid is identified.  IMPRESSION: Marked cardiomegaly without acute disease.   Electronically Signed   By: Drusilla Kanner M.D.   On: 12/08/2013 10:20   EKG: V paced  Assessment/Plan  1. Acute on chronic Systolic CHF -i suspect her BNP is falsely low due to Obesity -D-dimer negative diurese with IV lasix, 40mg  q8 -Replace K -repeact ECHO, last ECHo in 2011, EF of 25% -continue ACE, Coreg  2. Chronic systolic CHF , s/p ICD -see above  3. HTN -BP controlled -  on very high doses of anti-hypertensives -will cut down Imdur,  hydralazine for now  4. DM -continue lantus, SSI  5. Obesity -suspect has OSA too, needs sleep study to evaluate for this  6. Degenerative cervical disease with radiculopathy -continue Gabapentin/Norco -to see specialist in [place]  7. Hypokalemia -replace  DVT proph: lovenox  Code Status: Full Code Family Communication: no family at bedside Disposition Plan: inpatient  Time spent:  [facility] Pager [phone]

## 2013-12-08 NOTE — ED Provider Notes (Signed)
CSN: [id number]     Arrival date & time 12/08/13  0941 History  This chart was scribed for Artemus Romanoff B. Bernette Mayers, MD by Shari Heritage, ED Scribe. The patient was seen in room APA07/APA07. Patient's care was started at 10:48 AM.    Chief Complaint  Patient presents with  . Cough  . Shortness of Breath    The history is provided by the patient. No language interpreter was used.    HPI Comments: [name] is a 48 y.o. female with a history of CHF, cardiomyopathy, and defibrillator who presents to the Emergency Department complaining of constant severe shortness of breath onset 3 days ago. She has tried using nebulizer treatments at home, but these have not provided relief. She also has a history of OSA and is on CPAP, but not on oxygen at home. There is associated productive cough with thick clear and white without any blood sputum. She reports some chest pain that is worse with cough. She has leg swelling at baseline but does not feel this has changed recently. Her last echocardiogram was several years ago. Her other medical history includes DM, HTN, OSA, and peripheral neuropathy. She has also had blood transfusions in the past due to iron deficiency anemia.   Past Medical History  Diagnosis Date  . CHF (congestive heart failure)     chronic systolic CHF,  . Nonischemic cardiomyopathy     EF 25%  . Mitral regurgitation     moderate to severe  . S/P cardiac catheterization 10/06    normal coronary arteries  . LBBB (left bundle branch block)   . HTN (hypertension)   . Asthma   . IBS (irritable bowel syndrome)     with primarily constipation  . Morbid obesity   . GERD (gastroesophageal reflux disease)   . IDA (iron deficiency anemia)     2o TO SB AVMS, Hx parenteral iron Dr Mariel Sleet  . AVM (arteriovenous malformation)     Small bowel, s/p duble balloon enteroscopy/APC jejunum Dr Gwinda Passe [facility]) 01/23/2011  . Anemia, chronic disease   . Peripheral neuropathy   . OSA (obstructive  sleep apnea)     on CPAP qhs  . GI bleed 06/2009    Hgb 7.5, ferritin 7, transfusion, iron IV  . Dyspnea on exertion     chronic  . Diabetes mellitus without complication    Past Surgical History  Procedure Laterality Date  . Crdt-implantation  4/07    [employer]. remote- yes  . Appendectomy    . Cholecystectomy      biliary dyskinesia  . Mastectomy  2003    left, partial  . Knee arthroscopy  2005    left  . Small bowel enteroscopy  MAY 2012 DBE [facility] DR. GILLIAM    SB AVMS s/p APC  . Colonoscopy  OCT 2010/SEP 2011    tortuous colon, 3 polyps-benign(2010),   . Upper gastrointestinal endoscopy  '08, '10, SEP 2011    mild antral gastritis (2010), negative SB bx (2010), incomlpete Schatzki's ring (2011)  . Small bowel enteroscopy  SEP 2011 [facility]    Fields-NO AVMS  . Givens capsule study  NOV 2010     NO AVMS, normal  . Irrigation and debridement abscess  06/25/2012    Procedure: MINOR INCISION AND DRAINAGE OF ABSCESS;  Surgeon: [name], MD;  Location: [facility];  Service: General;  Laterality: [name];  Incision & Drainage of Infected Sebaceous Cyst on Chest  . Breast surgery    .  Cardiac defibrillator placement     Family History  Problem Relation Age of Onset  . Colon cancer Neg Hx     no family Hx of polyps too, uncle  . Cervical cancer Mother   . Hypertension Mother   . Heart disease Father   . Hypertension Father   . GER disease Father    History  Substance Use Topics  . Smoking status: Former Smoker -- 0.50 packs/day for 20 years    Types: Cigarettes    Quit date: 11/19/2009  . Smokeless tobacco: Never Used  . Alcohol Use: No   OB History   Grav Para Term Preterm Abortions TAB SAB Ect Mult Living                 Review of Systems A complete 10 system review of systems was obtained and all systems are negative except as noted in the HPI and PMH.    Allergies  Cymbalta; Lyrica; and Trazodone and nefazodone  Home Medications   Current  Outpatient Rx  Name  Route  Sig  Dispense  Refill  . albuterol (PROVENTIL) (5 MG/ML) 0.5% nebulizer solution   Nebulization   Take 0.5 mLs (2.5 mg total) by nebulization every 4 (four) hours as needed for wheezing or shortness of breath.   20 mL   11   . albuterol (VENTOLIN HFA) 108 (90 BASE) MCG/ACT inhaler   Inhalation   Inhale 2 puffs into the lungs every 6 (six) hours as needed. For shortness of breath         . benazepril (LOTENSIN) 40 MG tablet   Oral   Take 40 mg by mouth daily.          . carvedilol (COREG) 25 MG tablet   Oral   Take 0.5 tablets (12.5 mg total) by mouth 2 (two) times daily.   30 tablet   9   . cetirizine (ZYRTEC) 10 MG tablet   Oral   Take 10 mg by mouth daily as needed for allergies.          . cyclobenzaprine (FLEXERIL) 10 MG tablet   Oral   Take 10 mg by mouth 3 (three) times daily as needed. For muscle spasms         . docusate sodium (STOOL SOFTENER) 100 MG capsule   Oral   Take 100 mg by mouth daily.          . Etonogestrel (IMPLANON )   Subcutaneous   Inject 1 each into the skin once. Gets every 3 years         . ferrous sulfate 325 (65 FE) MG tablet   Oral   Take 325 mg by mouth 3 (three) times daily.         Marland Kitchen gabapentin (NEURONTIN) 300 MG capsule      Take two tablets four times a day   90 capsule   5   . hydrALAZINE (APRESOLINE) 100 MG tablet   Oral   Take 100 mg by mouth 3 (three) times daily. For heart failure         . HYDROcodone-acetaminophen (NORCO) 10-325 MG per tablet   Oral   Take 1 tablet by mouth every 4 (four) hours as needed.          . insulin aspart (NOVOLOG) 100 UNIT/ML injection   Subcutaneous   Inject 0-20 Units into the skin 3 (three) times daily with meals.   1 vial   5   .  insulin detemir (LEVEMIR) 100 UNIT/ML injection   Subcutaneous   Inject 80 Units into the skin at bedtime.         . isosorbide mononitrate (IMDUR) 60 MG 24 hr tablet   Oral   Take 60 mg by mouth 3  (three) times daily. For heart failure         . nabumetone (RELAFEN) 500 MG tablet   Oral   Take 1 tablet (500 mg total) by mouth 2 (two) times daily.   60 tablet   5   . nabumetone (RELAFEN) 500 MG tablet   Oral   Take 1 tablet (500 mg total) by mouth 2 (two) times daily.   60 tablet   5   . omeprazole (PRILOSEC) 40 MG capsule   Oral   Take 1 capsule (40 mg total) by mouth daily.   30 capsule   6   . potassium chloride SA (K-DUR,KLOR-CON) 20 MEQ tablet   Oral   Take 20 mEq by mouth 5 (five) times daily.          . predniSONE (DELTASONE) 5 MG tablet   Oral   Take 1 tablet (5 mg total) by mouth daily with breakfast.   30 tablet   5   . Temazepam (RESTORIL PO)   Oral   Take 20 mg by mouth at bedtime.         . torsemide (DEMADEX) 20 MG tablet   Oral   Take 2 tablets (40 mg total) by mouth daily. For heart failure   60 tablet   11   . vitamin E 400 UNIT capsule   Oral   Take 400 Units by mouth daily.          Triage Vitals: BP 117/77  Pulse 95  Temp(Src) 98.4 F (36.9 C) (Oral)  Resp 20  Ht 5\' 5"  (1.651 m)  Wt 273 lb (123.832 kg)  BMI 45.43 kg/m2  SpO2 98% Physical Exam  Nursing note and vitals reviewed. Constitutional: She is oriented to person, place, and time. She appears well-developed and well-nourished.  HENT:  Head: Normocephalic and atraumatic.  Eyes: EOM are normal. Pupils are equal, round, and reactive to light.  Neck: Normal range of motion. Neck supple.  Cardiovascular: Normal rate, normal heart sounds and intact distal pulses.   Pulmonary/Chest: She has decreased breath sounds. She has no wheezes.  Decreased air movement without wheezing.  Abdominal: Bowel sounds are normal. She exhibits no distension. There is no tenderness.  Musculoskeletal: Normal range of motion. She exhibits no edema and no tenderness.  Neurological: She is alert and oriented to person, place, and time. She has normal strength. No cranial nerve deficit or  sensory deficit.  Skin: Skin is warm and dry. No rash noted.  Psychiatric: She has a normal mood and affect.    ED Course  Procedures (including critical care time) DIAGNOSTIC STUDIES: Oxygen Saturation is 98% on room air, normal by my interpretation.    COORDINATION OF CARE: 10:51 AM- Will give albuterol treatment, and order CXR, CBC with diff, BMP, troponin and BNP. Patient informed of current plan for treatment and evaluation and agrees with plan at this time.     Labs Review Labs Reviewed  BASIC METABOLIC PANEL - Abnormal; Notable for the following:    Potassium 3.1 (*)    Chloride 88 (*)    CO2 38 (*)    Glucose, Bld 286 (*)    GFR calc non Af Amer 83 (*)  All other components within normal limits  PRO B NATRIURETIC PEPTIDE - Abnormal; Notable for the following:    Pro B Natriuretic peptide (BNP) 948.6 (*)    All other components within normal limits  CBC WITH DIFFERENTIAL  TROPONIN I    Imaging Review Dg Chest 2 View  12/08/2013   CLINICAL DATA:  Cough and congestion.  EXAM: CHEST  2 VIEW  COMPARISON:  Single view of the chest 11/19/2012. PA and lateral chest 09/12/2010.  FINDINGS: AICD remains in place. There is marked cardiomegaly without edema. Lungs are clear. No pneumothorax or pleural fluid is identified.  IMPRESSION: Marked cardiomegaly without acute disease.   Electronically Signed   By: Drusilla Kanner M.D.   On: 12/08/2013 10:20     EKG Interpretation   Date/Time:  Monday December 08 2013 10:04:51 EDT Ventricular Rate:  93 PR Interval:    QRS Duration: 112 QT Interval:  466 QTC Calculation: 579 R Axis:   5 Text Interpretation:  Ventricular-paced rhythm with frequent Premature  ventricular complexes Abnormal ECG When compared with ECG of 06-Apr-2013  16:23, No significant change since last tracing Confirmed by [facility]  MD,  Leonette Most [phone]) on 12/08/2013 10:13:57 AM      MDM   Final diagnoses:  Dyspnea    No definite cause of Dyspnea on eval,  likely multifactorial. On re-exam, patient is sitting up in chair for comfort with SpO2 87% on RA. Improves some with deep breath. Added Dimer which was neg. Discussed with hospitalist who will admit.   I personally performed the services described in this documentation, which was scribed in my presence. The recorded information has been reviewed and is accurate.      Bernardino Dowell B. Bernette Mayers, MD 12/08/13 1425

## 2013-12-09 DIAGNOSIS — I059 Rheumatic mitral valve disease, unspecified: Secondary | ICD-10-CM

## 2013-12-09 LAB — CBC
HEMATOCRIT: 38.8 % (ref 36.0–46.0)
Hemoglobin: 12.7 g/dL (ref 12.0–15.0)
MCH: 28.7 pg (ref 26.0–34.0)
MCHC: 32.7 g/dL (ref 30.0–36.0)
MCV: 87.6 fL (ref 78.0–100.0)
Platelets: 275 10*3/uL (ref 150–400)
RBC: 4.43 MIL/uL (ref 3.87–5.11)
RDW: 13.8 % (ref 11.5–15.5)
WBC: 5.7 10*3/uL (ref 4.0–10.5)

## 2013-12-09 LAB — BASIC METABOLIC PANEL
BUN: 13 mg/dL (ref 6–23)
CALCIUM: 8.3 mg/dL — AB (ref 8.4–10.5)
CO2: 35 mEq/L — ABNORMAL HIGH (ref 19–32)
CREATININE: 0.8 mg/dL (ref 0.50–1.10)
Chloride: 90 mEq/L — ABNORMAL LOW (ref 96–112)
GFR calc Af Amer: >90 mL/min (ref >90)
GFR, EST NON AFRICAN AMERICAN: 86 mL/min — AB (ref >90)
GLUCOSE: 301 mg/dL — AB (ref 70–99)
Potassium: 3 mEq/L — ABNORMAL LOW (ref 3.7–5.3)
Sodium: 138 mEq/L (ref 137–147)

## 2013-12-09 LAB — GLUCOSE, CAPILLARY
GLUCOSE-CAPILLARY: 217 mg/dL — AB (ref 70–99)
Glucose-Capillary: 217 mg/dL — ABNORMAL HIGH (ref 70–99)
Glucose-Capillary: 169 mg/dL — ABNORMAL HIGH (ref 70–99)
Glucose-Capillary: 256 mg/dL — ABNORMAL HIGH (ref 70–99)

## 2013-12-09 MED ORDER — POTASSIUM CHLORIDE CRYS ER 20 MEQ PO TBCR
40.0000 meq | EXTENDED_RELEASE_TABLET | Freq: Once | ORAL | Status: AC
  Administered 2013-12-09: 40 meq via ORAL
  Filled 2013-12-09: qty 2

## 2013-12-09 MED ORDER — FUROSEMIDE 10 MG/ML IJ SOLN
40.0000 mg | Freq: Two times a day (BID) | INTRAMUSCULAR | Status: DC
  Administered 2013-12-09 – 2013-12-10 (×2): 40 mg via INTRAVENOUS
  Filled 2013-12-09 (×2): qty 4

## 2013-12-09 MED ORDER — SODIUM CHLORIDE 0.9 % IJ SOLN
3.0000 mL | Freq: Two times a day (BID) | INTRAMUSCULAR | Status: DC
  Administered 2013-12-09 – 2013-12-16 (×5): 3 mL via INTRAVENOUS

## 2013-12-09 MED ORDER — SODIUM CHLORIDE 0.9 % IJ SOLN: 3.0000 mL | INTRAMUSCULAR | Status: DC | PRN

## 2013-12-09 MED ORDER — INSULIN DETEMIR 100 UNIT/ML ~~LOC~~ SOLN
90.0000 [IU] | Freq: Every day | SUBCUTANEOUS | Status: DC
  Administered 2013-12-09: 90 [IU] via SUBCUTANEOUS
  Filled 2013-12-09 (×2): qty 0.9

## 2013-12-09 MED ORDER — SODIUM CHLORIDE 0.9 % IV SOLN: 250.0000 mL | INTRAVENOUS | Status: DC | PRN

## 2013-12-09 NOTE — Progress Notes (Signed)
  [name] GHW:[id number] DOB: 20-Jul-1966 DOA: 12/08/2013 PCP: [name], MD   Subjective: This lady feels better. She was admitted with clinical congestive heart failure. She is awaiting echocardiogram.           Physical Exam: Blood pressure 110/78, pulse 82, temperature 98.4 F (36.9 C), temperature source Oral, resp. rate 20, height 5\' 5"  (1.651 m), weight 123.832 kg (273 lb), SpO2 100.00%. She looks systemically well. She is morbidly obese. Heart sounds are present without murmurs. Lung fields are entirely clear. There is very minimal peripheral pitting edema in her legs. She is alert and oriented.   Investigations:  No results found for this or any previous visit (from the past 240 hour(s)).   Basic Metabolic Panel:  Recent Labs  78/93/81 1028 12/09/13 0458  NA 137 138  K 3.1* 3.0*  CL 88* 90*  CO2 38* 35*  GLUCOSE 286* 301*  BUN 13 13  CREATININE 0.83 0.80  CALCIUM 8.7 8.3*   Liver Function Tests: No results found for this basename: AST, ALT, ALKPHOS, BILITOT, PROT, ALBUMIN,  in the last 72 hours   CBC:  Recent Labs  12/08/13 1028 12/09/13 0458  WBC 6.1 5.7  NEUTROABS 3.9  --   HGB 12.1 12.7  HCT 37.2 38.8  MCV 86.5 87.6  PLT 251 275    Dg Chest 2 View  12/08/2013   CLINICAL DATA:  Cough and congestion.  EXAM: CHEST  2 VIEW  COMPARISON:  Single view of the chest 11/19/2012. PA and lateral chest 09/12/2010.  FINDINGS: AICD remains in place. There is marked cardiomegaly without edema. Lungs are clear. No pneumothorax or pleural fluid is identified.  IMPRESSION: Marked cardiomegaly without acute disease.   Electronically Signed   By: Drusilla Kanner M.D.   On: 12/08/2013 10:20      Medications: I have reviewed the patient's current medications.  Impression: 1. Acute on chronic systolic congestive heart failure. 2. Type 2 diabetes mellitus, uncontrolled. 3. Morbid obesity. 4. Hypokalemia.     Plan: 1. Continue IV Lasix and  reduce the dose. 2. Replete potassium. 3. Await echocardiogram.  Consultants:  None.   Procedures:  None.   Antibiotics:  None.                   Code Status: Full code.  Family Communication: Discussed plan with patient at the bedside.   Disposition Plan: Home when medically stable.  Time spent: 15 minutes.   LOS: 1 day   GOSRANI,NIMISH C   12/09/2013, 10:59 AM

## 2013-12-09 NOTE — Care Management Note (Addendum)
    Page 1 of 2   12/16/2013     3:07:55 PM   CARE MANAGEMENT NOTE 12/16/2013  Patient:  [name], [name]   Account Number:  [id number]  Date Initiated:  12/09/2013  Documentation initiated by:  Rosemary Holms  Subjective/Objective Assessment:   Pt lives at home with her daughter and fiance. Has a CPAP (no O2) at home. CM discussed [facility] and their outpt services     Action/Plan:   Anticipated DC Date:  12/16/2013   Anticipated DC Plan:  HOME W HOME HEALTH SERVICES      DC Planning Services  CM consult      PAC Choice  DURABLE MEDICAL EQUIPMENT  HOME HEALTH   Choice offered to / List presented to:  C-1 Patient        HH arranged  HH-1 RN  HH-10 DISEASE MANAGEMENT  HH-2 PT      HH agency  [facility]   Status of service:  Completed, signed off Medicare Important Message given?   (If response is "NO", the following Medicare IM given date fields will be blank) Date Medicare IM given:   Date Additional Medicare IM given:    Discharge Disposition:  HOME W HOME HEALTH SERVICES  Per UR Regulation:    If discussed at Long Length of Stay Meetings, dates discussed:   12/16/2013    Comments:  4/7  1505 debbie Calla Wedekind rn,bsn spoke w pt. no pref to hhc agency. prev cmhad arranged w [facility] and pt agreeable. alerted donna w [facility] of disch for today.  12/10/13 Rosemary Holms RN BSN CM Pt accepted by [facility], Anibal Henderson spoke to pt on telephone. Brochure for [facility] given to pt. Discussed HH in addition to [facility]. Agreed to RN and chose [facility]. If O2 is needed, pt will also get this from [facility]  12/09/13 Rosemary Holms RN BSN CM Will refer Pt to [facility]

## 2013-12-09 NOTE — Progress Notes (Signed)
Utilization Review Complete  

## 2013-12-09 NOTE — Progress Notes (Signed)
*  PRELIMINARY RESULTS* Echocardiogram 2D Echocardiogram has been performed.  [name] 12/09/2013, 12:24 PM

## 2013-12-09 NOTE — Progress Notes (Signed)
Inpatient Diabetes Program Recommendations  AACE/ADA: New Consensus Statement on Inpatient Glycemic Control (2013)  Target Ranges:  Prepandial:   less than 140 mg/dL      Peak postprandial:   less than 180 mg/dL (1-2 hours)      Critically ill patients:  140 - 180 mg/dL   Results for KASSIDEY, SUNDAHL (MRN [id number]) as of 12/09/2013 08:44  Ref. Range 12/08/2013 12:39 12/08/2013 16:12 12/08/2013 20:43 12/09/2013 07:32  Glucose-Capillary Latest Range: 70-99 mg/dL 932 (H) 671 (H) 245 (H) 217 (H)    Diabetes history: DM2 Outpatient Diabetes medications: Levemir 80 units QHS, Novolog 0-20 units TID with meals Current orders for Inpatient glycemic control: Levemir 80 units QHS, Novolog 0-15 units AC  Inpatient Diabetes Program Recommendations Insulin - Basal: Please consider increasing Lantus to 85 units QHS. Correction (SSI): Please consider increasing Novolog correction to resistant scale and add Novolog bedtime correction scale. HgbA1C: Please order an A1C to evaluate glycemic control over the past 2-3 months. Last A1C in the chart was 13.0% on 06/21/2012.  Thanks, Orlando Penner, RN, MSN, CCRN Diabetes Coordinator Inpatient Diabetes Program [phone] (Team Pager) [phone] ([facility] office) [phone] [facility] office)

## 2013-12-10 DIAGNOSIS — R079 Chest pain, unspecified: Secondary | ICD-10-CM

## 2013-12-10 DIAGNOSIS — I428 Other cardiomyopathies: Secondary | ICD-10-CM

## 2013-12-10 HISTORY — DX: Other cardiomyopathies: I42.8

## 2013-12-10 LAB — COMPREHENSIVE METABOLIC PANEL
ALBUMIN: 3.5 g/dL (ref 3.5–5.2)
ALK PHOS: 93 U/L (ref 39–117)
ALT: 9 U/L (ref 0–35)
AST: 12 U/L (ref 0–37)
BUN: 17 mg/dL (ref 6–23)
CO2: 34 mEq/L — ABNORMAL HIGH (ref 19–32)
Calcium: 8.7 mg/dL (ref 8.4–10.5)
Chloride: 92 mEq/L — ABNORMAL LOW (ref 96–112)
Creatinine, Ser: 0.85 mg/dL (ref 0.50–1.10)
GFR calc Af Amer: >90 mL/min (ref >90)
GFR calc non Af Amer: 80 mL/min — ABNORMAL LOW (ref >90)
GLUCOSE: 249 mg/dL — AB (ref 70–99)
POTASSIUM: 4 meq/L (ref 3.7–5.3)
SODIUM: 139 meq/L (ref 137–147)
Total Bilirubin: 0.5 mg/dL (ref 0.3–1.2)
Total Protein: 9 g/dL — ABNORMAL HIGH (ref 6.0–8.3)

## 2013-12-10 LAB — GLUCOSE, CAPILLARY
GLUCOSE-CAPILLARY: 194 mg/dL — AB (ref 70–99)
GLUCOSE-CAPILLARY: 241 mg/dL — AB (ref 70–99)
Glucose-Capillary: 211 mg/dL — ABNORMAL HIGH (ref 70–99)
Glucose-Capillary: 190 mg/dL — ABNORMAL HIGH (ref 70–99)

## 2013-12-10 LAB — TROPONIN I
Troponin I: <0.3 ng/mL (ref <0.30)
Troponin I: <0.3 ng/mL (ref <0.30)
Troponin I: <0.3 ng/mL (ref <0.30)

## 2013-12-10 MED ORDER — HYDRALAZINE HCL 25 MG PO TABS
25.0000 mg | ORAL_TABLET | Freq: Three times a day (TID) | ORAL | Status: DC
  Administered 2013-12-10 – 2013-12-11 (×3): 25 mg via ORAL
  Filled 2013-12-10 (×8): qty 1

## 2013-12-10 MED ORDER — FUROSEMIDE 10 MG/ML IJ SOLN
60.0000 mg | Freq: Two times a day (BID) | INTRAMUSCULAR | Status: DC
  Administered 2013-12-10 – 2013-12-11 (×2): 60 mg via INTRAVENOUS
  Filled 2013-12-10 (×2): qty 6

## 2013-12-10 MED ORDER — REGADENOSON 0.4 MG/5ML IV SOLN
0.4000 mg | Freq: Once | INTRAVENOUS | Status: DC
  Filled 2013-12-10: qty 5

## 2013-12-10 MED ORDER — INSULIN DETEMIR 100 UNIT/ML ~~LOC~~ SOLN
45.0000 [IU] | Freq: Every day | SUBCUTANEOUS | Status: DC
  Administered 2013-12-10: 45 [IU] via SUBCUTANEOUS
  Filled 2013-12-10 (×2): qty 0.45

## 2013-12-10 NOTE — Progress Notes (Signed)
Patient evaluated for community based chronic disease management services with [facility] Care Management Program as a benefit of patient's [employer].  Attempted to reach patient in her room via phone.  No answer to phone.  Notified RNCM of attempt to reach out to this inpatient.  Will follow up on tomorrow.  Made Inpatient Case Manager aware that [facility] Care Management following. Of note, [facility] Care Management services does not replace or interfere with any services that are arranged by inpatient case management or social work.  For additional questions or referrals please contact Anibal Henderson BSN RN [facility] Liaison at [phone].

## 2013-12-10 NOTE — Progress Notes (Signed)
  [name] TMH:[id number] DOB: 05/08/1966 DOA: 12/08/2013 PCP: Barbara Obey, MD   Subjective: This lady feels better. She was admitted with clinical congestive heart failure. She had a bad night with vague symptoms of what she describes as indigestion or chest pain. She says that her breathing is somewhat worse than yesterday. I did decrease her Lasix yesterday as she was improving. She has not seen her cardiologist for over 6 months.           Physical Exam: Blood pressure 100/69, pulse 84, temperature 98.3 F (36.8 C), temperature source Oral, resp. rate 20, height 5\' 5"  (1.651 m), weight 126.4 kg (278 lb 10.6 oz), SpO2 100.00%. She looks systemically well. She is morbidly obese. Heart sounds are present without murmurs. I believe she is in a gallop rhythm at the present time. Lung fields are entirely clear. There is very minimal peripheral pitting edema in her legs. She is alert and oriented.   Investigations:     Basic Metabolic Panel:  Recent Labs  92/11/94 1028 12/09/13 0458  NA 137 138  K 3.1* 3.0*  CL 88* 90*  CO2 38* 35*  GLUCOSE 286* 301*  BUN 13 13  CREATININE 0.83 0.80  CALCIUM 8.7 8.3*       CBC:  Recent Labs  12/08/13 1028 12/09/13 0458  WBC 6.1 5.7  NEUTROABS 3.9  --   HGB 12.1 12.7  HCT 37.2 38.8  MCV 86.5 87.6  PLT 251 275    Dg Chest 2 View  12/08/2013   CLINICAL DATA:  Cough and congestion.  EXAM: CHEST  2 VIEW  COMPARISON:  Single view of the chest 11/19/2012. PA and lateral chest 09/12/2010.  FINDINGS: AICD remains in place. There is marked cardiomegaly without edema. Lungs are clear. No pneumothorax or pleural fluid is identified.  IMPRESSION: Marked cardiomegaly without acute disease.   Electronically Signed   By: Drusilla Kanner M.D.   On: 12/08/2013 10:20      Medications: I have reviewed the patient's current medications.  Impression: 1. Acute on chronic systolic congestive heart failure. History of nonischemic  cardiomyopathy previously documented ejection fraction of 25%, current echocardiogram is suggestive of ejection fraction of 35-40% but the recommendation is for a contrast study. Apparently this was not done on this occasion. She has an ICD in place. 2. Type 2 diabetes mellitus, uncontrolled. 3. Morbid obesity. 4. Chest pain, new symptom associated with increasing dyspnea.     Plan: 1. Continue IV Lasix and increase the dose to 60 mg every 12 hours. 2. Serial cardiac enzymes. 3. Cardiology consultation today.  Consultants:  Cardiology consultation pending.   Procedures:  None.   Antibiotics:  None.                   Code Status: Full code.  Family Communication: Discussed plan with patient at the bedside.   Disposition Plan: Home when medically stable.  Time spent: 15 minutes.   LOS: 2 days   Alya Smaltz C   12/10/2013, 8:02 AM

## 2013-12-10 NOTE — Progress Notes (Signed)
Inpatient Diabetes Program Recommendations  AACE/ADA: New Consensus Statement on Inpatient Glycemic Control (2013)  Target Ranges:  Prepandial:   less than 140 mg/dL      Peak postprandial:   less than 180 mg/dL (1-2 hours)      Critically ill patients:  140 - 180 mg/dL  Results for [name], [name] (MRN [id number]) as of 12/10/2013 09:14  Ref. Range 06/21/2012 18:50  Hemoglobin A1C Latest Range: <5.7 % 13.0 (H)   Results for [name], [name] (MRN [id number]) as of 12/10/2013 09:14  Ref. Range 12/09/2013 07:32 12/09/2013 11:33 12/09/2013 16:33 12/09/2013 21:24 12/10/2013 07:53  Glucose-Capillary Latest Range: 70-99 mg/dL 130 (H) 865 (H) 784 (H) 256 (H) 194 (H)   Inpatient Diabetes Program Recommendations Correction (SSI): Please consider increasing Novolog correction to resistant scale and add Novolog bedtime correction scale. HgbA1C: Please consider ordering an A1C to evaluate glycemic control over the past 2-3 months. Last A1C in the chart was 13.0% on 06/21/2012.  Thanks, Orlando Penner, RN, MSN, CCRN Diabetes Coordinator Inpatient Diabetes Program [phone] (Team Pager) [phone] ([facility] office) [phone] [facility] office)

## 2013-12-10 NOTE — Consult Note (Addendum)
CARDIOLOGY CONSULT NOTE   Patient ID: [name] MRN: [id number] DOB/AGE: 03/06/1966 48 y.o.  Admit Date: 12/08/2013 Referring Physician: John Giovanni MD Primary Physician: [name], MD Consulting Cardiologist: Dina Rich MD Primary Cardiologist: Formerly Dr. Daleen Squibb Electrophysiologist: [name] MD Reason for Consultation: Systolic CHF  Clinical Summary [name] is a 48 y.o.female with known history of NICM, EF of 25%, [employer] BiV ICD in 2013  normal cardiac cath in 2006, moderate to severe mitral MR, chronic LBBB, diabetes and hypertension, who presents to ER with complaints of left arm pain and progressive DOE. She was admitted for A/C systolic CHF. She was 263 lbs on follow up appt 7/.2014 with admission weight 278 lbs.     She states that prior to coming into the hospital she has not be complaint with low sodium diet. She ate some [place] food and stew made at her church. Began to have worsening breathing with PND over the weekend. She states she took extra doses of lasix because of this along with weight gain. It was not helpful to her and she has orthopnea. She also began to have chest pressure.    She was treated with IV lasix 80 mg in ER, along with neb treatments. She was also found to be hypokalemic with potassium of 3.1. This was repleated.    Last night she had substernal stabbing sharp pain while lying in bed. She had a neb tx and O2 was placed. She was planned to go home this am, but with chest discomfort we are asked to see her.  Cardiac markers are negative. She states she has been having dysphagia as well, with trouble swallowing even water, causing her to choke. She has not been seen by a GI specialist for this, however, records reveal she has been seen by Dr. Jena Gauss in 2013 with recommendations for EDG.   Currently she is feeling better and without complaint of recurrent chest pain or PND.          Allergies  Allergen Reactions  .  Cymbalta [Duloxetine Hcl]     "Spontaneous type behavior"  . Lyrica [Pregabalin] Swelling  . Trazodone And Nefazodone Other (See Comments)    Nightmares     Medications Scheduled Medications: . benazepril  40 mg Oral Daily  . carvedilol  12.5 mg Oral BID  . docusate sodium  100 mg Oral Daily  . enoxaparin (LOVENOX) injection  40 mg Subcutaneous Q24H  . ferrous sulfate  325 mg Oral TID  . furosemide  60 mg Intravenous Q12H  . gabapentin  300 mg Oral BID  . hydrALAZINE  50 mg Oral TID  . insulin aspart  0-15 Units Subcutaneous TID WC  . insulin detemir  90 Units Subcutaneous QHS  . isosorbide mononitrate  60 mg Oral Daily  . pantoprazole  40 mg Oral Daily  . potassium chloride SA  40 mEq Oral BID  . sodium chloride  3 mL Intravenous Q12H     Infusions:     PRN Medications:  sodium chloride, acetaminophen, acetaminophen, albuterol, cyclobenzaprine, HYDROcodone-acetaminophen, ondansetron (ZOFRAN) IV, ondansetron, sodium chloride, temazepam   Past Medical History  Diagnosis Date  . CHF (congestive heart failure)     chronic systolic CHF,  . Nonischemic cardiomyopathy     EF 25%  . Mitral regurgitation     moderate to severe  . S/P cardiac catheterization 10/06    normal coronary arteries  . LBBB (left bundle Breslin Hemann block)   .  HTN (hypertension)   . Asthma   . IBS (irritable bowel syndrome)     with primarily constipation  . Morbid obesity   . GERD (gastroesophageal reflux disease)   . IDA (iron deficiency anemia)     2o TO SB AVMS, Hx parenteral iron Dr Mariel SleetNeijstrom  . AVM (arteriovenous malformation)     Small bowel, s/p duble balloon enteroscopy/[facility] jejunum Dr Gwinda PasseGilliam [facility] 01/23/2011  . Anemia, chronic disease   . Peripheral neuropathy   . OSA (obstructive sleep apnea)     on CPAP qhs  . GI bleed 06/2009    Hgb 7.5, ferritin 7, transfusion, iron IV  . Dyspnea on exertion     chronic  . Diabetes mellitus without complication     Past Surgical History   Procedure Laterality Date  . Crdt-implantation  4/07    [employer]. remote- yes  . Appendectomy    . Cholecystectomy      biliary dyskinesia  . Mastectomy  2003    left, partial  . Knee arthroscopy  2005    left  . Small bowel [facility]  [facility] GILLIAM    SB AVMS s/p APC  . Colonoscopy  OCT 2010/SEP 2011    tortuous colon, 3 polyps-benign(2010),   . Upper gastrointestinal endoscopy  '08, '10, SEP 2011    mild antral gastritis (2010), negative SB bx (2010), incomlpete Schatzki's ring (2011)  . Small bowel enteroscopy  SEP 2011 [facility]    Fields-NO AVMS  . Givens capsule study  NOV 2010     NO AVMS, normal  . Irrigation and debridement abscess  06/25/2012    Procedure: MINOR INCISION AND DRAINAGE OF ABSCESS;  Surgeon: Marlane HatcherWilliam S Bradford, MD;  Location: [facility];  Service: General;  Laterality: [name];  Incision & Drainage of Infected Sebaceous Cyst on Chest  . Breast surgery    . Cardiac defibrillator placement      Family History  Problem Relation Age of Onset  . Colon cancer Neg Hx     no family Hx of polyps too, uncle  . Cervical cancer Mother   . Hypertension Mother   . Heart disease Father   . Hypertension Father   . GER disease Father     Social History [name] reports that she quit smoking about 4 years ago. Her smoking use included Cigarettes. She has a 10 pack-year smoking history. She has never used smokeless tobacco. [name] reports that she does not drink alcohol.  Review of Systems Otherwise reviewed and negative except as outlined.  Physical Examination Blood pressure 100/69, pulse 84, temperature 98.3 F (36.8 C), temperature source Oral, resp. rate 20, height 5\' 5"  (1.651 m), weight 278 lb 10.6 oz (126.4 kg), SpO2 89.00%. No intake or output data in the 24 hours ending 12/10/13 1037  Telemetry: NSR with LBBB  GEN:No acute distress HEENT: Conjunctiva and lids normal, oropharynx clear with moist mucosa. Neck: Supple, no  elevated JVP or carotid bruits, no thyromegaly. Lungs: Clear to auscultation, nonlabored breathing at rest. Cardiac: Regular rate and rhythm, no S3 or significant systolic murmur, no pericardial rub. Abdomen: Soft, nontender, no hepatomegaly, bowel sounds present, no guarding or rebound. Extremities: No pitting edema, distal pulses 2+. Skin: Warm and dry. Musculoskeletal: No kyphosis. Neuropsychiatric: Alert and oriented x3, affect grossly appropriate.  Prior Cardiac Testing/Procedures 1. Echocardiogram 12/09/2013 Study data: Technically difficult study. Consider contrast study. - Left ventricle: Assessment of LV systolic function is limited by poor visualization.  Grossly LVEF appears mildly to moderatelyreduced, approximately 35-40%. Consider contrast study fore more accurate evaluation. Findings consistent with left ventricular diastolic dysfunction, indeterminant grade. There is evidence of significantly eleved LA pressure (E/e' 19). - Aortic valve: Mildly calcified annulus. Mildly thickened leaflets. Valve area: 2.47cm^2(VTI). Valve area: 2.41cm^2 (Vmax). - Mitral valve: Mildly calcified annulus. Mildly thickened leaflets . Mild to moderate regurgitation. - Left atrium: The atrium was severely dilated. - Right ventricle: The cavity size was mildly dilated. - Pulmonary arteries: PA pressure is at least borderline elevated at 35 mmHg. Unable to estimate RA pressure, the IVC is poorly visualized.  ICD Interrogation: 09/25/2013 Functioning normally  3. Cardiac Cath 2006 CORONARY ANGIOGRAPHY:  The left main coronary artery is absent.  The left anterior descending coronary artery is a large trans-apical vessel,  which appears to have, if not a separate ostium, then a cloacal ostium.  There are 3 diagonals. The entire LAD system is free of disease.  Circumflex coronary artery also has a separate ostium. It is a dominant  vessel with 2 large obtuse marginals and a large posterior  descending. It is  free of disease.  The right coronary artery is a small dominant vessel, which is free of  disease.  Left ventriculogram reveals global hypokinesis with an ejection fraction of  25% and 1+ mitral regurgitation.  ASSESSMENT:  1. Normal coronaries.  2. Cardiomyopathy.  3. Normal right heart pressures.  Lab Results  Basic Metabolic Panel:  Recent Labs Lab 12/08/13 1028 12/09/13 0458 12/10/13 0828  NA 137 138 139  K 3.1* 3.0* 4.0  CL 88* 90* 92*  CO2 38* 35* 34*  GLUCOSE 286* 301* 249*  BUN 13 13 17   CREATININE 0.83 0.80 0.85  CALCIUM 8.7 8.3* 8.7    Liver Function Tests:  Recent Labs Lab 12/10/13 0828  AST 12  ALT 9  ALKPHOS 93  BILITOT 0.5  PROT 9.0*  ALBUMIN 3.5    CBC:  Recent Labs Lab 12/08/13 1028 12/09/13 0458  WBC 6.1 5.7  NEUTROABS 3.9  --   HGB 12.1 12.7  HCT 37.2 38.8  MCV 86.5 87.6  PLT 251 275    Cardiac Enzymes:  Recent Labs Lab 12/08/13 1028 12/10/13 0828  TROPONINI <0.30 <0.30    BNP: 948.   Radiology: CXR 12/08/2013 IMPRESSION: Marked cardiomegaly without acute disease  ECG: Atrial sensing and Ventricular paced rhythm.   Impression and Recommendations  1. Chest Pain: Cardiac cath in 2006 demonstrated normal coronaries. Cardiac markers are negative, arguing against ACS. Echo does not demonstrate reduction in EF, in fact, improvement is shown from previously documented EF of 25% to 35%-40%per orders echo this admission. Doubt cardiac etiology for chest discomfort at this time.   2. Acute on Chronic Mixed CHF: :Lung sounds are clear and there was no evidence of pulmonary edema or CHF per CXR,. She has some abdominal distention noted, but no overt LEE.   She is on lasix 60 mg IV Q 12 hours. No I/O has been recorded. Wt is unchanged. Will order strict I/O. Creatinine 0.85. Will change to PO torsemide 60 mg daily in am. She is strongly encouraged to avoid fast foods, or foods that contain salt.  3. NICM with  BiV ICD in situ: Interrogated in January of 2015 and found to be functioning appropriately.   4. Morbid Obesity: She has associated sleep apnea. Uses CPAP at home. Recommend increase activity and weight loss.   5. Dysphagia: Recommend follow up with GI as OP  for re-visit of need to have EDG.     Signed: Bettey Mare. Lyman Bishop NP [facility] 12/10/2013, 10:37 AM Co-Sign MD  Attending note Patient seen and discussed with NP Lyman Bishop, agree with her documentation above. 48 yo female history of chronic systolic heart failure/NICM LVEF 35-40% by echo 12/09/13,[employer] BiV AICD, diastolic dysfunction, mild to mod MR, HTN, obesity, AVMs, chronic anemia admitted with SOB and weight gain. There is some concern for not limiting the sodium in her diet recently. She has been on IV diuretics with improvement in her breathing. She also reports a 2 week history of intermittent chest pain. Described as intermittent 8/10 sharp chest pain associated with SOB and diaphoresis. 3 episodes over the last 2 weeks, including 2 episodes since in the hospital. No evidence of ACS by troponins, D-dimer negative. EKG is V-paced, cannot evaluate for ischemia. Prior cath in 2006 with no disease, has not had ischemic evaluation since that time.   Overall acute on chronic systolic heart failure improving with IV diuresis. Somewhat atypical chest pain without evidence of ACS, however she is diabetic and could be experiencing atypical angina. Ischemia would also be on the differential for possible exacerbating factor of her heart failure. Will obtain Lexiscan to better evaluate for possible ischemia.   **Please note I decreased her nighttime insulin in half b/c she will be NPO tonight, I also held her imdur in anticipation of her stress test. I also decreased her hydral to 25mg  tid in setting of borderline low bp   Dina Rich MD

## 2013-12-11 ENCOUNTER — Encounter (HOSPITAL_COMMUNITY): Payer: Self-pay

## 2013-12-11 ENCOUNTER — Inpatient Hospital Stay (HOSPITAL_COMMUNITY): Payer: Medicare Other

## 2013-12-11 ENCOUNTER — Other Ambulatory Visit: Payer: Medicare Other

## 2013-12-11 DIAGNOSIS — I208 Other forms of angina pectoris: Secondary | ICD-10-CM | POA: Diagnosis present

## 2013-12-11 DIAGNOSIS — I2089 Other forms of angina pectoris: Secondary | ICD-10-CM | POA: Diagnosis present

## 2013-12-11 LAB — GLUCOSE, CAPILLARY
GLUCOSE-CAPILLARY: 265 mg/dL — AB (ref 70–99)
Glucose-Capillary: 237 mg/dL — ABNORMAL HIGH (ref 70–99)
Glucose-Capillary: 233 mg/dL — ABNORMAL HIGH (ref 70–99)
Glucose-Capillary: 235 mg/dL — ABNORMAL HIGH (ref 70–99)

## 2013-12-11 LAB — BASIC METABOLIC PANEL
BUN: 23 mg/dL (ref 6–23)
CALCIUM: 8.8 mg/dL (ref 8.4–10.5)
CO2: 35 mEq/L — ABNORMAL HIGH (ref 19–32)
CREATININE: 1.08 mg/dL (ref 0.50–1.10)
Chloride: 94 mEq/L — ABNORMAL LOW (ref 96–112)
GFR calc Af Amer: 70 mL/min — ABNORMAL LOW (ref >90)
GFR calc non Af Amer: 60 mL/min — ABNORMAL LOW (ref >90)
GLUCOSE: 287 mg/dL — AB (ref 70–99)
Potassium: 4.7 mEq/L (ref 3.7–5.3)
Sodium: 138 mEq/L (ref 137–147)

## 2013-12-11 MED ORDER — HEPARIN BOLUS VIA INFUSION
4000.0000 [IU] | Freq: Once | INTRAVENOUS | Status: AC
  Administered 2013-12-11: 4000 [IU] via INTRAVENOUS
  Filled 2013-12-11: qty 4000

## 2013-12-11 MED ORDER — SODIUM CHLORIDE 0.9 % IV SOLN: 250.0000 mL | INTRAVENOUS | Status: DC | PRN

## 2013-12-11 MED ORDER — SODIUM CHLORIDE 0.9 % IJ SOLN: 3.0000 mL | INTRAMUSCULAR | Status: DC | PRN

## 2013-12-11 MED ORDER — TORSEMIDE 20 MG PO TABS
60.0000 mg | ORAL_TABLET | Freq: Every day | ORAL | Status: DC
  Administered 2013-12-11: 60 mg via ORAL
  Filled 2013-12-11 (×2): qty 3

## 2013-12-11 MED ORDER — ASPIRIN 81 MG PO CHEW
81.0000 mg | CHEWABLE_TABLET | ORAL | Status: AC
  Administered 2013-12-12: 81 mg via ORAL
  Filled 2013-12-11: qty 1

## 2013-12-11 MED ORDER — ASPIRIN EC 81 MG PO TBEC
81.0000 mg | DELAYED_RELEASE_TABLET | Freq: Every day | ORAL | Status: DC
  Administered 2013-12-11 – 2013-12-16 (×5): 81 mg via ORAL
  Filled 2013-12-11 (×6): qty 1

## 2013-12-11 MED ORDER — TORSEMIDE 20 MG PO TABS: 60.0000 mg | ORAL_TABLET | Freq: Every day | ORAL | Status: DC

## 2013-12-11 MED ORDER — KETOROLAC TROMETHAMINE 30 MG/ML IJ SOLN
30.0000 mg | Freq: Three times a day (TID) | INTRAMUSCULAR | Status: AC | PRN
  Administered 2013-12-11 – 2013-12-12 (×3): 30 mg via INTRAVENOUS
  Filled 2013-12-11 (×3): qty 1

## 2013-12-11 MED ORDER — INSULIN DETEMIR 100 UNIT/ML ~~LOC~~ SOLN
90.0000 [IU] | Freq: Every day | SUBCUTANEOUS | Status: DC
  Administered 2013-12-11 – 2013-12-15 (×5): 90 [IU] via SUBCUTANEOUS
  Filled 2013-12-11 (×8): qty 0.9

## 2013-12-11 MED ORDER — INSULIN ASPART 100 UNIT/ML ~~LOC~~ SOLN
0.0000 [IU] | SUBCUTANEOUS | Status: DC
  Administered 2013-12-12 (×2): 3 [IU] via SUBCUTANEOUS
  Administered 2013-12-12: 7 [IU] via SUBCUTANEOUS
  Administered 2013-12-12: 1 [IU] via SUBCUTANEOUS

## 2013-12-11 MED ORDER — SODIUM CHLORIDE 0.9 % IV SOLN
1.0000 mL/kg/h | INTRAVENOUS | Status: DC
  Administered 2013-12-12: 1 mL/kg/h via INTRAVENOUS

## 2013-12-11 MED ORDER — SODIUM CHLORIDE 0.9 % IV SOLN: 1.0000 mL/kg/h | INTRAVENOUS | Status: DC

## 2013-12-11 MED ORDER — ASPIRIN 81 MG PO CHEW: 81.0000 mg | CHEWABLE_TABLET | ORAL | Status: DC

## 2013-12-11 MED ORDER — SODIUM CHLORIDE 0.9 % IJ SOLN: 3.0000 mL | Freq: Two times a day (BID) | INTRAMUSCULAR | Status: DC

## 2013-12-11 MED ORDER — HEPARIN (PORCINE) IN NACL 100-0.45 UNIT/ML-% IJ SOLN
1450.0000 [IU]/h | INTRAMUSCULAR | Status: DC
  Administered 2013-12-11: 1250 [IU]/h via INTRAVENOUS
  Administered 2013-12-12 (×2): 1450 [IU]/h via INTRAVENOUS
  Filled 2013-12-11 (×3): qty 250

## 2013-12-11 MED ORDER — NITROGLYCERIN 2 % TD OINT
0.5000 [in_us] | TOPICAL_OINTMENT | Freq: Four times a day (QID) | TRANSDERMAL | Status: DC
  Administered 2013-12-11 – 2013-12-12 (×2): 0.5 [in_us] via TOPICAL
  Filled 2013-12-11: qty 30

## 2013-12-11 MED ORDER — TECHNETIUM TC 99M SESTAMIBI GENERIC - CARDIOLITE
10.0000 | Freq: Once | INTRAVENOUS | Status: AC | PRN
  Administered 2013-12-11: 10 via INTRAVENOUS

## 2013-12-11 MED ORDER — DIAZEPAM 5 MG PO TABS: 5.0000 mg | ORAL_TABLET | ORAL | Status: DC

## 2013-12-11 MED ORDER — TECHNETIUM TC 99M SESTAMIBI - CARDIOLITE
30.0000 | Freq: Once | INTRAVENOUS | Status: AC | PRN
  Administered 2013-12-11: 30 via INTRAVENOUS

## 2013-12-11 MED ORDER — REGADENOSON 0.4 MG/5ML IV SOLN
INTRAVENOUS | Status: AC
  Administered 2013-12-11: 0.4 mg via INTRAVENOUS
  Filled 2013-12-11: qty 5

## 2013-12-11 MED ORDER — SODIUM CHLORIDE 0.9 % IJ SOLN
INTRAMUSCULAR | Status: AC
  Administered 2013-12-11: 10 mL via INTRAVENOUS
  Filled 2013-12-11: qty 10

## 2013-12-11 NOTE — Progress Notes (Signed)
Report called to [place] on [address] at [facility]. Patient transported by [facility]

## 2013-12-11 NOTE — Progress Notes (Signed)
Pt arrived from [facility] with plans for cath in AM for chest pain and + nuc stress test.

## 2013-12-11 NOTE — Progress Notes (Addendum)
Consulting cardiologist: Dina RichBranch, Whittaker Lenis MD Primary Cardiologist: Dina RichBranch, Hy Swiatek MD  Subjective:    Some chest pain overnight, but "not bad." Breathing better. (Pt seen in stress lab prior to Lexiscan)  Objective:   Temp:  [98.1 F (36.7 C)-98.6 F (37 C)] 98.1 F (36.7 C) (04/02 0457) Pulse Rate:  [74-92] 92 (04/02 0457) Resp:  [19-20] 19 (04/02 0457) BP: (87-108)/(55-72) 96/55 mmHg (04/02 0457) SpO2:  [98 %-100 %] 98 % (04/02 0457) Weight:  [284 lb 6.4 oz (129.003 kg)] 284 lb 6.4 oz (129.003 kg) (04/02 0457) Last BM Date: 12/09/13  Filed Weights   12/09/13 2131 12/10/13 0419 12/11/13 0457  Weight: 278 lb 14.1 oz (126.5 kg) 278 lb 10.6 oz (126.4 kg) 284 lb 6.4 oz (129.003 kg)    Intake/Output Summary (Last 24 hours) at 12/11/13 1050 Last data filed at 12/10/13 2245  Gross per 24 hour  Intake    480 ml  Output   1500 ml  Net  -1020 ml    Telemetry: NSR with LBBB.   Exam:  General: No acute distress.  HEENT: Conjunctiva and lids normal, oropharynx clear.  Lungs: Clear to auscultation, nonlabored.  Cardiac: No elevated JVP or bruits. RRR, no gallop or rub.   Abdomen: Normoactive bowel sounds, nontender, nondistended.  Extremities: No pitting edema, distal pulses full.  Neuropsychiatric: Alert and oriented x3, affect appropriate.   Lab Results:  Basic Metabolic Panel:  Recent Labs Lab 12/09/13 0458 12/10/13 0828 12/11/13 0650  NA 138 139 138  K 3.0* 4.0 4.7  CL 90* 92* 94*  CO2 35* 34* 35*  GLUCOSE 301* 249* 287*  BUN 13 17 23   CREATININE 0.80 0.85 1.08  CALCIUM 8.3* 8.7 8.8    Liver Function Tests:  Recent Labs Lab 12/10/13 0828  AST 12  ALT 9  ALKPHOS 93  BILITOT 0.5  PROT 9.0*  ALBUMIN 3.5    CBC:  Recent Labs Lab 12/08/13 1028 12/09/13 0458  WBC 6.1 5.7  HGB 12.1 12.7  HCT 37.2 38.8  MCV 86.5 87.6  PLT 251 275    Cardiac Enzymes:  Recent Labs Lab 12/10/13 0828 12/10/13 1347 12/10/13 2002  TROPONINI <0.30  <0.30 <0.30    BNP:  Recent Labs  04/06/13 1549 06/10/13 0954 12/08/13 1028  PROBNP 612.1* 508.1* 948.6*    Coagulation: No results found for this basename: INR,  in the last 168 hours  Radiology: No results found.      Medications:   Scheduled Medications: . benazepril  40 mg Oral Daily  . carvedilol  12.5 mg Oral BID  . docusate sodium  100 mg Oral Daily  . enoxaparin (LOVENOX) injection  40 mg Subcutaneous Q24H  . ferrous sulfate  325 mg Oral TID  . gabapentin  300 mg Oral BID  . hydrALAZINE  25 mg Oral TID  . insulin aspart  0-15 Units Subcutaneous TID WC  . insulin detemir  45 Units Subcutaneous QHS  . pantoprazole  40 mg Oral Daily  . potassium chloride SA  40 mEq Oral BID  . regadenoson  0.4 mg Intravenous Once  . sodium chloride  3 mL Intravenous Q12H  . torsemide  60 mg Oral Daily    Infusions:    PRN Medications: sodium chloride, acetaminophen, acetaminophen, albuterol, cyclobenzaprine, HYDROcodone-acetaminophen, ketorolac, ondansetron (ZOFRAN) IV, ondansetron, sodium chloride, technetium sestamibi, temazepam   Assessment and Plan:   1. Chest Pain: Atypical with normal cardiac cath in 2006. Normal cardiac markers. Lexiscan stress test this  am. If normal patient can go home.   2. Acute on Chronic Mixed CHF: Breathing status is improved without evidence of fluid overload. Change to po torsemide this am. Low salt diet is recommended at home.   3. NICM with Bi-V ICD in situ: Interrogated in January of 2015 and found to be functioning appropriately.   4. Morbid Obesity: She has associated sleep apnea. Uses CPAP at home. Recommend increase activity and weight loss.   5. Dysphagia: Recommend follow up with GI as OP for re-visit of need to have EDG.     Bettey Mare. Lyman Bishop NP [facility] 12/11/2013, 10:50 AM  Attending Note Patient seen and discussed with NP Lyman Bishop, agree with her documentation above. Description of chest pain is mixed for  cardiac etiology with several atypical features, she is at risk for atypical angina given the fact she is diabetic. Last ischemic eval nearly 10 years ago. Will follow up Lexiscan results today. Her volume status is improved, labs suggests she is becoming intravascularly depleted. Agree with stopping IV lasix and converting to oral torsemide today.    Addendum 300 pm Abnormal lexiscan with evidence of ischemia in the apex. She has significant pendulus breast tissue with varying position in the pre and post injection images, cannot for certain rule out possible artifact. However given the low EF and area of myocardium at jeopardy it is a high risk study.  She has continued to have intermittent episodes of chest pain during this admission. Concern for atypical angina in this diabetic patient. She initially presented volume overloaded and there is potential that ischemia could have been her exacerbating factor. Will initiate anticoagulation, transfer patient to [facility] for heart cath. Continue ASA, beta blocker, ACE-I. She will need a lipid panel. She had been on 2 antianginals (coreg, imdur) with symptoms, imdur was held prior to stress test last night. Soft bp, will not restart at this time.    Dina Rich MD

## 2013-12-11 NOTE — Progress Notes (Signed)
Inpatient Diabetes Program Recommendations  AACE/ADA: New Consensus Statement on Inpatient Glycemic Control (2013)  Target Ranges:  Prepandial:   less than 140 mg/dL      Peak postprandial:   less than 180 mg/dL (1-2 hours)      Critically ill patients:  140 - 180 mg/dL   Results for MAKENLEY, SCHWIETERMAN (MRN [id number]) as of 12/11/2013 08:37  Ref. Range 12/10/2013 07:53 12/10/2013 12:00 12/10/2013 16:42 12/10/2013 19:47 12/11/2013 08:04  Glucose-Capillary Latest Range: 70-99 mg/dL 009 (H) 381 (H) 829 (H) 190 (H) 265 (H)   Diabetes history: DM2  Outpatient Diabetes medications: Levemir 80 units QHS, Novolog 0-20 units TID with meals  Current orders for Inpatient glycemic control: Levemir 45 units QHS, Novolog 0-15 units AC  Inpatient Diabetes Program Recommendations Insulin - Basal: Noted Levemir was decreased from 90 units QHS to 45 units QHS yesterday for stress test this morning. Please consider increasing Levemir back to 90 units QHS. Correction (SSI): Please consider increasing Novolog correction to resistant scale and add Novolog bedtime correction scale. HgbA1C: Please order an A1C to evaluate glycemic control over the past 2-3 months. Last A1C in the chart was 13.0% on 06/21/2012.  Thanks, Orlando Penner, RN, MSN, CCRN Diabetes Coordinator Inpatient Diabetes Program [phone] (Team Pager) [phone] ([facility] office) [phone] [facility] office)

## 2013-12-11 NOTE — Progress Notes (Signed)
  [name]:[id number] DOB: 29-Nov-1965 DOA: 12/08/2013 PCP: [name], MD   Subjective: This lady feels better. She was admitted with clinical congestive heart failure. She says that her dyspnea is better but she is bothered by the sharp central chest pain. She was seen by cardiology yesterday and the plan is for her to have a stress test today.           Physical Exam: Blood pressure 96/55, pulse 92, temperature 98.1 F (36.7 C), temperature source Oral, resp. rate 19, height 5\' 5"  (1.651 m), weight 129.003 kg (284 lb 6.4 oz), SpO2 98.00%. She looks systemically well. She is morbidly obese. Heart sounds are present without murmurs. I believe she is not in a gallop rhythm at the present time. Lung fields are entirely clear. She does appear to have anterior chest wall tenderness reproducing her pain. There is very minimal peripheral pitting edema in her legs. She is alert and oriented.   Investigations:     Basic Metabolic Panel:  Recent Labs  31/54/00 0828 12/11/13 0650  NA 139 138  K 4.0 4.7  CL 92* 94*  CO2 34* 35*  GLUCOSE 249* 287*  BUN 17 23  CREATININE 0.85 1.08  CALCIUM 8.7 8.8       CBC:  Recent Labs  12/08/13 1028 12/09/13 0458  WBC 6.1 5.7  NEUTROABS 3.9  --   HGB 12.1 12.7  HCT 37.2 38.8  MCV 86.5 87.6  PLT 251 275    No results found.    Medications: I have reviewed the patient's current medications.  Impression: 1. Acute on chronic systolic congestive heart failure. History of nonischemic cardiomyopathy previously documented ejection fraction of 25%, current echocardiogram is suggestive of ejection fraction of 35-40% but the recommendation is for a contrast study. Apparently this was not done on this occasion. She has an ICD in place. 2. Type 2 diabetes mellitus, better control. 3. Morbid obesity. 4. Chest pain, new symptom associated with increasing dyspnea. For  stress test today.     Plan: 1. Attempt to try to  change over to oral diuretics. She was taking torsemide 40 mg daily at home. I will put her a dose of torsemide 60 mg daily. 2. Try a couple of doses of Toradol intravenously to see if this can help the muscular  pain which I believe she has. Of course, this pain could be cardiac presentation which would be atypical but a stress test will be helpful in defining this. I will not give her her more than 3 doses of Toradol in view of her history of heart failure.   Consultants:  Cardiology consultation.   Procedures:  None.   Antibiotics:  None.                   Code Status: Full code.  Family Communication: Discussed plan with patient at the bedside.   Disposition Plan: Home when medically stable. I suspect she'll be able to go home tomorrow, unless the stress test is abnormal in which case she may need further investigations.  Time spent: 15 minutes.   LOS: 3 days   GOSRANI,NIMISH C   12/11/2013, 7:55 AM

## 2013-12-11 NOTE — Progress Notes (Signed)
Stress Lab Nurses Notes - [name]  [name] 12/11/2013 Reason for doing test: Chest Pain and Dyspnea Type of test: [name] Nurse performing test: [name], RN Nuclear Medicine Tech: [name] Echo Tech: Not Applicable MD performing test: Branch/K.Lawrence NP Family MD: [name] Test explained and consent signed: yes IV started: 22g jelco, Saline lock flushed, No redness or edema and Saline lock from floor Symptoms: Nausea Treatment/Intervention: None Reason test stopped: protocol completed After recovery IV was: No redness or edema and Saline Lock flushed Patient to return to Nuc. Med at :10:45 Patient discharged: Transported back to room 323 via WC Patient's Condition upon discharge was: stable Comments: During test O2 in progress @ 2L via n\c & BP 118/68 & HR 100.  Recovery BP 104/68 & HR 93.  Symptoms resolved in recovery. [name] T

## 2013-12-11 NOTE — Progress Notes (Signed)
Received verbal consent to engage patient for transition of care call.  [facility] will reach out to the patient post discharge for disease management assistance.  Of note, [facility] Care Management services does not replace or interfere with any services that are arranged by inpatient case management or social work.  For additional questions or referrals please contact Anibal Henderson BSN RN [facility] Liaison at [phone].

## 2013-12-11 NOTE — Progress Notes (Signed)
Resting room air sat 89%. Ambulating room air sat 85%. O2 sat with O2 on at 2 liters [place] 97%.

## 2013-12-11 NOTE — Progress Notes (Signed)
ANTICOAGULATION CONSULT NOTE - Initial Consult  Pharmacy Consult for Heparin Indication: chest pain/ACS  Allergies  Allergen Reactions  . Cymbalta [Duloxetine Hcl]     "Spontaneous type behavior"  . Lyrica [Pregabalin] Swelling  . Trazodone And Nefazodone Other (See Comments)    Nightmares     Patient Measurements: Height: 5\' 5"  (165.1 cm) Weight: 284 lb 6.4 oz (129.003 kg) IBW/kg (Calculated) : 57 Heparin Dosing Weight: 88.5 kg  Vital Signs: Temp: 98.1 F (36.7 C) (04/02 0457) Temp src: Oral (04/02 0457) BP: 104/74 mmHg (04/02 1326) Pulse Rate: 101 (04/02 1100)  Labs:  Recent Labs  12/09/13 0458 12/10/13 0828 12/10/13 1347 12/10/13 2002 12/11/13 0650  HGB 12.7  --   --   --   --   HCT 38.8  --   --   --   --   PLT 275  --   --   --   --   CREATININE 0.80 0.85  --   --  1.08  TROPONINI  --  <0.30 <0.30 <0.30  --     Estimated Creatinine Clearance: 87.2 ml/min (by C-G formula based on Cr of 1.08).   Medical History: Past Medical History  Diagnosis Date  . CHF (congestive heart failure)     chronic systolic CHF,  . Nonischemic cardiomyopathy     EF 25%  . Mitral regurgitation     moderate to severe  . S/P cardiac catheterization 10/06    normal coronary arteries  . LBBB (left bundle branch block)   . HTN (hypertension)   . Asthma   . IBS (irritable bowel syndrome)     with primarily constipation  . Morbid obesity   . GERD (gastroesophageal reflux disease)   . IDA (iron deficiency anemia)     2o TO SB AVMS, Hx parenteral iron Dr Mariel SleetNeijstrom  . AVM (arteriovenous malformation)     Small bowel, s/p duble balloon enteroscopy/[facility] jejunum Dr Gwind[name] 01/23/2011  . Anemia, chronic disease   . Peripheral neuropathy   . OSA (obstructive sleep apnea)     on CPAP qhs  . GI bleed 06/2009    Hgb 7.5, ferritin 7, transfusion, iron IV  . Dyspnea on exertion     chronic  . Diabetes mellitus without complication     Medications:  Scheduled:  .  aspirin EC  81 mg Oral Daily  . benazepril  40 mg Oral Daily  . carvedilol  12.5 mg Oral BID  . docusate sodium  100 mg Oral Daily  . ferrous sulfate  325 mg Oral TID  . gabapentin  300 mg Oral BID  . heparin  4,000 Units Intravenous Once  . hydrALAZINE  25 mg Oral TID  . insulin aspart  0-15 Units Subcutaneous TID WC  . insulin detemir  90 Units Subcutaneous QHS  . pantoprazole  40 mg Oral Daily  . potassium chloride SA  40 mEq Oral BID  . regadenoson  0.4 mg Intravenous Once  . sodium chloride  3 mL Intravenous Q12H  . torsemide  60 mg Oral Daily    Assessment: Abnormal lexiscan with evidence of ischemia in the apex. Intermittent episodes of chest pain during this admission Concern for atypical angina. [facility].  Goal of Therapy:  Heparin level 0.3-0.7 units/ml Monitor platelets by anticoagulation protocol: Yes   Plan:  Give 4000 units bolus x 1 Start heparin infusion at 1250 units/hr Check anti-Xa level in 6 hours  and daily while on heparin Continue to monitor H&H and platelets   Raquel James, Virgilia Quigg Bennett 12/11/2013,3:12 PM

## 2013-12-12 ENCOUNTER — Other Ambulatory Visit: Payer: Self-pay

## 2013-12-12 ENCOUNTER — Encounter (HOSPITAL_COMMUNITY)
Admission: EM | Disposition: A | Discharge status: Home Health | Payer: Self-pay | Source: Home / Self Care | Attending: Cardiology

## 2013-12-12 DIAGNOSIS — R079 Chest pain, unspecified: Secondary | ICD-10-CM

## 2013-12-12 DIAGNOSIS — I5023 Acute on chronic systolic (congestive) heart failure: Secondary | ICD-10-CM | POA: Diagnosis present

## 2013-12-12 DIAGNOSIS — I279 Pulmonary heart disease, unspecified: Secondary | ICD-10-CM

## 2013-12-12 HISTORY — PX: LEFT AND RIGHT HEART CATHETERIZATION WITH CORONARY/GRAFT ANGIOGRAM: SHX5448

## 2013-12-12 LAB — CBC
HCT: 33.8 % — ABNORMAL LOW (ref 36.0–46.0)
HEMATOCRIT: 38.1 % (ref 36.0–46.0)
HEMOGLOBIN: 12.2 g/dL (ref 12.0–15.0)
Hemoglobin: 10.9 g/dL — ABNORMAL LOW (ref 12.0–15.0)
MCH: 28.8 pg (ref 26.0–34.0)
MCH: 28.8 pg (ref 26.0–34.0)
MCHC: 32 g/dL (ref 30.0–36.0)
MCHC: 32.2 g/dL (ref 30.0–36.0)
MCV: 90.1 fL (ref 78.0–100.0)
MCV: 89.4 fL (ref 78.0–100.0)
PLATELETS: 210 10*3/uL (ref 150–400)
Platelets: 290 10*3/uL (ref 150–400)
RBC: 4.23 MIL/uL (ref 3.87–5.11)
RBC: 3.78 MIL/uL — AB (ref 3.87–5.11)
RDW: 14.5 % (ref 11.5–15.5)
RDW: 14.4 % (ref 11.5–15.5)
WBC: 7 10*3/uL (ref 4.0–10.5)
WBC: 6.1 10*3/uL (ref 4.0–10.5)

## 2013-12-12 LAB — POCT I-STAT 3, VENOUS BLOOD GAS (G3P V)
Bicarbonate: 31.2 mEq/L — ABNORMAL HIGH (ref 20.0–24.0)
O2 Saturation: 40 %
TCO2: 34 mmol/L (ref 0–100)
pCO2, Ven: 81.9 mmHg (ref 45.0–50.0)
pH, Ven: 7.188 — CL (ref 7.250–7.300)
pO2, Ven: 30 mmHg (ref 30.0–45.0)

## 2013-12-12 LAB — POCT I-STAT 3, ART BLOOD GAS (G3+)
BICARBONATE: 29.8 meq/L — AB (ref 20.0–24.0)
O2 SAT: 96 %
TCO2: 32 mmol/L (ref 0–100)
pCO2 arterial: 71.2 mmHg (ref 35.0–45.0)
pH, Arterial: 7.229 — ABNORMAL LOW (ref 7.350–7.450)
pO2, Arterial: 100 mmHg (ref 80.0–100.0)

## 2013-12-12 LAB — GLUCOSE, CAPILLARY
GLUCOSE-CAPILLARY: 331 mg/dL — AB (ref 70–99)
GLUCOSE-CAPILLARY: 340 mg/dL — AB (ref 70–99)
Glucose-Capillary: 120 mg/dL — ABNORMAL HIGH (ref 70–99)
Glucose-Capillary: 140 mg/dL — ABNORMAL HIGH (ref 70–99)
Glucose-Capillary: 180 mg/dL — ABNORMAL HIGH (ref 70–99)
Glucose-Capillary: 240 mg/dL — ABNORMAL HIGH (ref 70–99)

## 2013-12-12 LAB — BASIC METABOLIC PANEL
BUN: 27 mg/dL — ABNORMAL HIGH (ref 6–23)
BUN: 26 mg/dL — AB (ref 6–23)
CALCIUM: 8.3 mg/dL — AB (ref 8.4–10.5)
CHLORIDE: 98 meq/L (ref 96–112)
CO2: 28 meq/L (ref 19–32)
CO2: 31 meq/L (ref 19–32)
CREATININE: 0.96 mg/dL (ref 0.50–1.10)
Calcium: 8 mg/dL — ABNORMAL LOW (ref 8.4–10.5)
Chloride: 94 mEq/L — ABNORMAL LOW (ref 96–112)
Creatinine, Ser: 1.05 mg/dL (ref 0.50–1.10)
GFR calc Af Amer: 80 mL/min — ABNORMAL LOW (ref >90)
GFR calc non Af Amer: 62 mL/min — ABNORMAL LOW (ref >90)
GFR calc non Af Amer: 69 mL/min — ABNORMAL LOW (ref >90)
GFR, EST AFRICAN AMERICAN: 72 mL/min — AB (ref >90)
Glucose, Bld: 331 mg/dL — ABNORMAL HIGH (ref 70–99)
Glucose, Bld: 124 mg/dL — ABNORMAL HIGH (ref 70–99)
Potassium: 5.6 mEq/L — ABNORMAL HIGH (ref 3.7–5.3)
Potassium: 4.1 mEq/L (ref 3.7–5.3)
Sodium: 133 mEq/L — ABNORMAL LOW (ref 137–147)
Sodium: 141 mEq/L (ref 137–147)

## 2013-12-12 LAB — HEPARIN LEVEL (UNFRACTIONATED)
Heparin Unfractionated: 0.18 IU/mL — ABNORMAL LOW (ref 0.30–0.70)
Heparin Unfractionated: 0.2 IU/mL — ABNORMAL LOW (ref 0.30–0.70)

## 2013-12-12 LAB — PREGNANCY, URINE: PREG TEST UR: NEGATIVE

## 2013-12-12 LAB — PROTIME-INR
INR: 1.08 (ref 0.00–1.49)
Prothrombin Time: 13.8 seconds (ref 11.6–15.2)

## 2013-12-12 LAB — POCT ACTIVATED CLOTTING TIME
Activated Clotting Time: 155 seconds
Activated Clotting Time: 199 seconds

## 2013-12-12 LAB — MRSA PCR SCREENING: MRSA by PCR: NEGATIVE

## 2013-12-12 SURGERY — LEFT AND RIGHT HEART CATHETERIZATION WITH CORONARY/GRAFT ANGIOGRAM

## 2013-12-12 MED ORDER — SODIUM CHLORIDE 0.9 % IJ SOLN: 10.0000 mL | INTRAMUSCULAR | Status: DC | PRN

## 2013-12-12 MED ORDER — FUROSEMIDE 10 MG/ML IJ SOLN
INTRAMUSCULAR | Status: AC
  Filled 2013-12-12: qty 4

## 2013-12-12 MED ORDER — VERAPAMIL HCL 2.5 MG/ML IV SOLN
INTRAVENOUS | Status: AC
  Filled 2013-12-12: qty 2

## 2013-12-12 MED ORDER — HEPARIN SODIUM (PORCINE) 5000 UNIT/ML IJ SOLN: 5000.0000 [IU] | Freq: Three times a day (TID) | INTRAMUSCULAR | Status: DC

## 2013-12-12 MED ORDER — SODIUM CHLORIDE 0.9 % IJ SOLN: 3.0000 mL | INTRAMUSCULAR | Status: DC | PRN

## 2013-12-12 MED ORDER — LIDOCAINE HCL (PF) 1 % IJ SOLN
INTRAMUSCULAR | Status: AC
Start: 2013-12-12 — End: 2013-12-12
  Filled 2013-12-12: qty 30

## 2013-12-12 MED ORDER — SODIUM CHLORIDE 0.9 % IJ SOLN
3.0000 mL | Freq: Two times a day (BID) | INTRAMUSCULAR | Status: DC
  Administered 2013-12-13 – 2013-12-16 (×2): 3 mL via INTRAVENOUS

## 2013-12-12 MED ORDER — OXYCODONE-ACETAMINOPHEN 5-325 MG PO TABS
1.0000 | ORAL_TABLET | Freq: Four times a day (QID) | ORAL | Status: DC | PRN
  Administered 2013-12-12 – 2013-12-14 (×2): 2 via ORAL
  Filled 2013-12-12 (×2): qty 2

## 2013-12-12 MED ORDER — SODIUM CHLORIDE 0.9 % IV SOLN
250.0000 mL | INTRAVENOUS | Status: DC | PRN
  Administered 2013-12-14: 250 mL via INTRAVENOUS

## 2013-12-12 MED ORDER — MILRINONE IN DEXTROSE 20 MG/100ML IV SOLN
0.2500 ug/kg/min | INTRAVENOUS | Status: DC
  Administered 2013-12-12 – 2013-12-14 (×6): 0.25 ug/kg/min via INTRAVENOUS
  Filled 2013-12-12 (×5): qty 100

## 2013-12-12 MED ORDER — HEPARIN SODIUM (PORCINE) 1000 UNIT/ML IJ SOLN
INTRAMUSCULAR | Status: AC
Start: 2013-12-12 — End: 2013-12-12
  Filled 2013-12-12: qty 1

## 2013-12-12 MED ORDER — FUROSEMIDE 10 MG/ML IJ SOLN
40.0000 mg | Freq: Once | INTRAMUSCULAR | Status: AC
  Administered 2013-12-12: 40 mg via INTRAVENOUS

## 2013-12-12 MED ORDER — BENAZEPRIL HCL 5 MG PO TABS
5.0000 mg | ORAL_TABLET | Freq: Every day | ORAL | Status: DC
  Administered 2013-12-13 – 2013-12-14 (×2): 5 mg via ORAL
  Filled 2013-12-12 (×2): qty 1

## 2013-12-12 MED ORDER — NITROGLYCERIN 0.2 MG/ML ON CALL CATH LAB
INTRAVENOUS | Status: AC
  Filled 2013-12-12: qty 1

## 2013-12-12 MED ORDER — HEPARIN (PORCINE) IN NACL 2-0.9 UNIT/ML-% IJ SOLN
INTRAMUSCULAR | Status: AC
  Filled 2013-12-12: qty 1500

## 2013-12-12 MED ORDER — SODIUM CHLORIDE 0.9 % IJ SOLN
10.0000 mL | Freq: Two times a day (BID) | INTRAMUSCULAR | Status: DC
  Administered 2013-12-12: 20 mL
  Administered 2013-12-13: 10 mL
  Administered 2013-12-14: 20 mL
  Administered 2013-12-15: 10 mL
  Administered 2013-12-16: 12 mL

## 2013-12-12 MED ORDER — HEPARIN SODIUM (PORCINE) 5000 UNIT/ML IJ SOLN
5000.0000 [IU] | Freq: Three times a day (TID) | INTRAMUSCULAR | Status: DC
  Filled 2013-12-12 (×2): qty 1

## 2013-12-12 MED ORDER — INSULIN ASPART 100 UNIT/ML ~~LOC~~ SOLN
SUBCUTANEOUS | Status: AC
  Filled 2013-12-12: qty 1

## 2013-12-12 MED ORDER — FENTANYL CITRATE 0.05 MG/ML IJ SOLN
INTRAMUSCULAR | Status: AC
  Filled 2013-12-12: qty 2

## 2013-12-12 MED ORDER — FUROSEMIDE 10 MG/ML IJ SOLN
INTRAMUSCULAR | Status: AC
  Filled 2013-12-12: qty 8

## 2013-12-12 MED ORDER — FUROSEMIDE 10 MG/ML IJ SOLN
80.0000 mg | Freq: Three times a day (TID) | INTRAMUSCULAR | Status: DC
  Administered 2013-12-12: 80 mg via INTRAVENOUS

## 2013-12-12 MED ORDER — MIDAZOLAM HCL 2 MG/2ML IJ SOLN
INTRAMUSCULAR | Status: AC
  Filled 2013-12-12: qty 2

## 2013-12-12 MED ORDER — HEPARIN SODIUM (PORCINE) 5000 UNIT/ML IJ SOLN
5000.0000 [IU] | Freq: Three times a day (TID) | INTRAMUSCULAR | Status: DC
  Administered 2013-12-12 – 2013-12-16 (×12): 5000 [IU] via SUBCUTANEOUS
  Filled 2013-12-12 (×14): qty 1

## 2013-12-12 MED ORDER — DEXTROSE 5 % IV SOLN
15.0000 mg/h | INTRAVENOUS | Status: DC
  Administered 2013-12-12 – 2013-12-13 (×2): 10 mg/h via INTRAVENOUS
  Administered 2013-12-14 – 2013-12-15 (×2): 15 mg/h via INTRAVENOUS
  Filled 2013-12-12 (×6): qty 25

## 2013-12-12 NOTE — Progress Notes (Signed)
Pt received from cath lab into 2H14 on bed and monitor accompanied by fiance. Pt alert and oriented in no acute distress, denies pain. Report received at bedside from [facility] See flowsheets for VS and assessments.

## 2013-12-12 NOTE — Progress Notes (Signed)
Received Diabetes Coordinator consult. Currently in Cath lab.  Will have staff RN's review DM education.  Will have staff check patient on giving own insulin, checking own CBGs, give patient DM Mosby notes. Diabetes history: DM2; Outpatient Diabetes medications: Levemir 80 units QHS, Novolog 0-20 units TID with meals;See Diabetes Coordinator note from 4/2.  Will continue to follow while in hospital.  Smith Mince RN BSN CDE

## 2013-12-12 NOTE — H&P (View-Only) (Signed)
  Consulting cardiologist: Reshaun Briseno MD Primary Cardiologist: Lexa Coronado MD  Subjective:    Some chest pain overnight, but "not bad." Breathing better. (Pt seen in stress lab prior to Lexiscan)  Objective:   Temp:  [98.1 F (36.7 C)-98.6 F (37 C)] 98.1 F (36.7 C) (04/02 0457) Pulse Rate:  [74-92] 92 (04/02 0457) Resp:  [19-20] 19 (04/02 0457) BP: (87-108)/(55-72) 96/55 mmHg (04/02 0457) SpO2:  [98 %-100 %] 98 % (04/02 0457) Weight:  [284 lb 6.4 oz (129.003 kg)] 284 lb 6.4 oz (129.003 kg) (04/02 0457) Last BM Date: 12/09/13  Filed Weights   12/09/13 2131 12/10/13 0419 12/11/13 0457  Weight: 278 lb 14.1 oz (126.5 kg) 278 lb 10.6 oz (126.4 kg) 284 lb 6.4 oz (129.003 kg)    Intake/Output Summary (Last 24 hours) at 12/11/13 1050 Last data filed at 12/10/13 2245  Gross per 24 hour  Intake    480 ml  Output   1500 ml  Net  -1020 ml    Telemetry: NSR with LBBB.   Exam:  General: No acute distress.  HEENT: Conjunctiva and lids normal, oropharynx clear.  Lungs: Clear to auscultation, nonlabored.  Cardiac: No elevated JVP or bruits. RRR, no gallop or rub.   Abdomen: Normoactive bowel sounds, nontender, nondistended.  Extremities: No pitting edema, distal pulses full.  Neuropsychiatric: Alert and oriented x3, affect appropriate.   Lab Results:  Basic Metabolic Panel:  Recent Labs Lab 12/09/13 0458 12/10/13 0828 12/11/13 0650  NA 138 139 138  K 3.0* 4.0 4.7  CL 90* 92* 94*  CO2 35* 34* 35*  GLUCOSE 301* 249* 287*  BUN 13 17 23  CREATININE 0.80 0.85 1.08  CALCIUM 8.3* 8.7 8.8    Liver Function Tests:  Recent Labs Lab 12/10/13 0828  AST 12  ALT 9  ALKPHOS 93  BILITOT 0.5  PROT 9.0*  ALBUMIN 3.5    CBC:  Recent Labs Lab 12/08/13 1028 12/09/13 0458  WBC 6.1 5.7  HGB 12.1 12.7  HCT 37.2 38.8  MCV 86.5 87.6  PLT 251 275    Cardiac Enzymes:  Recent Labs Lab 12/10/13 0828 12/10/13 1347 12/10/13 2002  TROPONINI <0.30  <0.30 <0.30    BNP:  Recent Labs  04/06/13 1549 06/10/13 0954 12/08/13 1028  PROBNP 612.1* 508.1* 948.6*    Coagulation: No results found for this basename: INR,  in the last 168 hours  Radiology: No results found.      Medications:   Scheduled Medications: . benazepril  40 mg Oral Daily  . carvedilol  12.5 mg Oral BID  . docusate sodium  100 mg Oral Daily  . enoxaparin (LOVENOX) injection  40 mg Subcutaneous Q24H  . ferrous sulfate  325 mg Oral TID  . gabapentin  300 mg Oral BID  . hydrALAZINE  25 mg Oral TID  . insulin aspart  0-15 Units Subcutaneous TID WC  . insulin detemir  45 Units Subcutaneous QHS  . pantoprazole  40 mg Oral Daily  . potassium chloride SA  40 mEq Oral BID  . regadenoson  0.4 mg Intravenous Once  . sodium chloride  3 mL Intravenous Q12H  . torsemide  60 mg Oral Daily    Infusions:    PRN Medications: sodium chloride, acetaminophen, acetaminophen, albuterol, cyclobenzaprine, HYDROcodone-acetaminophen, ketorolac, ondansetron (ZOFRAN) IV, ondansetron, sodium chloride, technetium sestamibi, temazepam   Assessment and Plan:   1. Chest Pain: Atypical with normal cardiac cath in 2006. Normal cardiac markers. Lexiscan stress test this   am. If normal patient can go home.   2. Acute on Chronic Mixed CHF: Breathing status is improved without evidence of fluid overload. Change to po torsemide this am. Low salt diet is recommended at home.   3. NICM with Bi-V ICD in situ: Interrogated in January of 2015 and found to be functioning appropriately.   4. Morbid Obesity: She has associated sleep apnea. Uses CPAP at home. Recommend increase activity and weight loss.   5. Dysphagia: Recommend follow up with GI as OP for re-visit of need to have EDG.     Bettey Mare. Lyman Bishop NP [facility] 12/11/2013, 10:50 AM  Attending Note Patient seen and discussed with NP Lyman Bishop, agree with her documentation above. Description of chest pain is mixed for  cardiac etiology with several atypical features, she is at risk for atypical angina given the fact she is diabetic. Last ischemic eval nearly 10 years ago. Will follow up Lexiscan results today. Her volume status is improved, labs suggests she is becoming intravascularly depleted. Agree with stopping IV lasix and converting to oral torsemide today.    Addendum 300 pm Abnormal lexiscan with evidence of ischemia in the apex. She has significant pendulus breast tissue with varying position in the pre and post injection images, cannot for certain rule out possible artifact. However given the low EF and area of myocardium at jeopardy it is a high risk study.  She has continued to have intermittent episodes of chest pain during this admission. Concern for atypical angina in this diabetic patient. She initially presented volume overloaded and there is potential that ischemia could have been her exacerbating factor. Will initiate anticoagulation, transfer patient to [facility] for heart cath. Continue ASA, beta blocker, ACE-I. She will need a lipid panel. She had been on 2 antianginals (coreg, imdur) with symptoms, imdur was held prior to stress test last night. Soft bp, will not restart at this time.    Dina Rich MD

## 2013-12-12 NOTE — Progress Notes (Signed)
ANTICOAGULATION CONSULT NOTE - Follow Up Consult  Pharmacy Consult for Heparin  Indication: chest pain/ACS  Allergies  Allergen Reactions  . Cymbalta [Duloxetine Hcl]     "Spontaneous type behavior"  . Lyrica [Pregabalin] Swelling  . Trazodone And Nefazodone Other (See Comments)    Nightmares    Patient Measurements: Height: 5\' 5"  (165.1 cm) Weight: 288 lb (130.636 kg) IBW/kg (Calculated) : 57 Heparin Dosing Weight: ~89 kg  Vital Signs: Temp: 98.5 F (36.9 C) (04/03 0500) Temp src: Oral (04/03 0500) BP: 107/89 mmHg (04/03 0857) Pulse Rate: 90 (04/03 0938)  Labs:  Recent Labs  12/10/13 0828 12/10/13 1347 12/10/13 2002 12/11/13 0650 12/11/13 2340 12/12/13 0320 12/12/13 0827  HGB  --   --   --   --   --  12.2  --   HCT  --   --   --   --   --  38.1  --   PLT  --   --   --   --   --  290  --   LABPROT  --   --   --   --   --  13.8  --   INR  --   --   --   --   --  1.08  --   HEPARINUNFRC  --   --   --   --  0.20*  --  0.18*  CREATININE 0.85  --   --  1.08  --  1.05  --   TROPONINI <0.30 <0.30 <0.30  --   --   --   --     Estimated Creatinine Clearance: 90.3 ml/min (by C-G formula based on Cr of 1.05).  Medications:  Heparin 1250 units/hr  Assessment: 48 y/o F tx from [facility] for CP. She got cath today with normal coronaries. Plan to start ionotropic therapy. SQ heparin has been ordered for DVT px. Pt is morbidly obese. Will just use standard dosing SQ heparin.     Plan:   Heparin 5k SQ q8 tonight Rx will sign off

## 2013-12-12 NOTE — Progress Notes (Signed)
Placed pt,. On cpap as per order. Pt. Is tolerating well at this time.

## 2013-12-12 NOTE — Progress Notes (Signed)
ANTICOAGULATION CONSULT NOTE - Follow Up Consult  Pharmacy Consult for Heparin  Indication: chest pain/ACS  Allergies  Allergen Reactions  . Cymbalta [Duloxetine Hcl]     "Spontaneous type behavior"  . Lyrica [Pregabalin] Swelling  . Trazodone And Nefazodone Other (See Comments)    Nightmares    Patient Measurements: Height: 5\' 5"  (165.1 cm) Weight: 271 lb (122.925 kg) IBW/kg (Calculated) : 57 Heparin Dosing Weight: ~89 kg  Vital Signs: Temp: 98.4 F (36.9 C) (04/02 1952) Temp src: Oral (04/02 1952) BP: 84/58 mmHg (04/02 1952) Pulse Rate: 85 (04/02 1952)  Labs:  Recent Labs  12/09/13 0458 12/10/13 0828 12/10/13 1347 12/10/13 2002 12/11/13 0650 12/11/13 2340  HGB 12.7  --   --   --   --   --   HCT 38.8  --   --   --   --   --   PLT 275  --   --   --   --   --   HEPARINUNFRC  --   --   --   --   --  0.20*  CREATININE 0.80 0.85  --   --  1.08  --   TROPONINI  --  <0.30 <0.30 <0.30  --   --     Estimated Creatinine Clearance: 84.8 ml/min (by C-G formula based on Cr of 1.08).  Medications:  Heparin 1250 units/hr  Assessment: 48 y/o F tx from [facility] for CP, likely for cath today, HL is 0.2, other labs as above.   Goal of Therapy:  Heparin level 0.3-0.7 units/ml Monitor platelets by anticoagulation protocol: Yes   Plan:  -Increase heparin drip to 1450 units/hr -0800 HL -Daily CBC/HL -Monitor for bleeding  Abran Duke 12/12/2013,12:58 AM

## 2013-12-12 NOTE — Interval H&P Note (Signed)
History and Physical Interval Note:  12/12/2013 9:06 AM  Hortense Ramal  has presented today for surgery, with the diagnosis of CP  The various methods of treatment have been discussed with the patient and family. After consideration of risks, benefits and other options for treatment, the patient has consented to  Procedure(s): LEFT HEART CATHETERIZATION WITH CORONARY ANGIOGRAM ([name]) as a surgical intervention .  The patient's history has been reviewed, patient examined, no change in status, stable for surgery.  I have reviewed the patient's chart and labs.  Questions were answered to the patient's satisfaction.   Cath Lab Visit (complete for each Cath Lab visit)  Clinical Evaluation Leading to the Procedure:   ACS: yes  Non-ACS:    Anginal Classification: CCS IV  Anti-ischemic medical therapy: Minimal Therapy (1 class of medications)  Non-Invasive Test Results: High-risk stress test findings: cardiac mortality >3%/year  Prior CABG: No previous CABG        Theron Arista [facility] 12/12/2013 9:06 AM

## 2013-12-12 NOTE — CV Procedure (Signed)
   Cardiac Catheterization Procedure Note  Name: [name] MRN: [id number] DOB: 27-Jul-1966  Procedure: Right Heart Cath, Left Heart Cath, Selective Coronary Angiography  Indication: 48 yo BF with history of nonischemic cardiomyopathy presents with symptoms of chest pain and dyspnea. Myoview showed evidence of apical ischemia and EF 34%.    Procedural Details: The right arm was prepped, draped, and anesthetized with 1% lidocaine. Using the modified Seldinger technique a 5 French sheath was placed in the right radial artery with ultrasound guidance. Verapamil 3 mg was given intra-arterially. The patient has a left dominant circulation. The left main was a shared ostium for the LAD and LCx. It was very difficult to engage the left coronary. Unable to access with JL 3.5 or 3.0 catheters. Finally accessed with a EZRAD left catheter. The RCA was engaged with a Williams right catheter. Once LV pressures were noted we decided to proceed with a right heart cath. We were unable to access a brachial vein so we proceeded with femoral vein access. This was difficult due to her body habitus and inability to palpate a pulse. With US guidance a 7 French sheath was placed in the right femoral vein using a modified Seldinger technique. A Swan-Ganz catheter was used for the right heart catheterization. Standard protocol was followed for recording of right heart pressures and sampling of oxygen saturations. Fick cardiac output was calculated.  There were no immediate procedural complications. The patient was transferred to the post catheterization recovery area for further monitoring.  Procedural Findings: Hemodynamics RA 38/37 mean 35 mm Hg RV 90/28 mm Hg PA 95/51 mean 71 mm Hg PCWP 57/73 mean 54 mm Hg LV 127/50 mm Hg AO 123/66 mean 100 mm Hg  Oxygen saturations: PA 40% AO 96%  Cardiac Output (Fick) 3.2 L/min  Cardiac Index (Fick) 1.4 L/min/meter squared   Coronary angiography: Coronary dominance:  left  Left mainstem: Very short shared ostium  Left anterior descending (LAD): Normal  Left circumflex (LCx): large dominant vessel. Normal.  Right coronary artery (RCA): nondominant, normal.  Left ventriculography: not done due to high filling pressures.  Final Conclusions:   1. Normal coronary anatomy 2. Severe pulmonary HTN with markedly elevated LV filling pressures.  Recommendations: Transfer to ICU. IV diruesis. Will hold beta blocker for now. IV inotropic therapy. Will consult advanced heart failure service.   Theron Arista [facility] 12/12/2013, 10:57 AM

## 2013-12-12 NOTE — Progress Notes (Signed)
Peripherally Inserted Central Catheter/Midline Placement  The IV Nurse has discussed with the patient and/or persons authorized to consent for the patient, the purpose of this procedure and the potential benefits and risks involved with this procedure.  The benefits include less needle sticks, lab draws from the catheter and patient may be discharged home with the catheter.  Risks include, but not limited to, infection, bleeding, blood clot (thrombus formation), and puncture of an artery; nerve damage and irregular heat beat.  Alternatives to this procedure were also discussed.  PICC/Midline Placement Documentation  PICC / Midline Double Lumen 12/12/13 PICC Right Basilic 40 cm 2 cm (Active)  Indication for Insertion or Continuance of Line Administration of hyperosmolar/irritating solutions (i.e. TPN, Vancomycin, etc.) 12/12/2013  8:00 PM  Exposed Catheter (cm) 2 cm 12/12/2013  8:00 PM  Site Assessment Clean;Dry;Intact 12/12/2013  8:00 PM  Lumen #1 Status Flushed;Saline locked;Blood return noted 12/12/2013  8:00 PM  Lumen #2 Status Flushed;Saline locked;Blood return noted 12/12/2013  8:00 PM  Dressing Type Transparent 12/12/2013  8:00 PM  Dressing Status Clean;Dry;Intact;Antimicrobial disc in place 12/12/2013  8:00 PM  Dressing Change Due 12/19/13 12/12/2013  8:00 PM       Primo Innis, Lajean Manes 12/12/2013, 8:02 PM

## 2013-12-12 NOTE — Progress Notes (Signed)
RN spoke with Dr. Jens Som regarding PICC team's concerns with using right arm for PICC placement given right radial artery catheterization earlier today. RN also spoke with MD concerning slow downtrend in pt's BP; was 110s/50s on arrival, now 89-96 with MAPs 62-63 after 1400 ml UOP since 1600. Dr. Jens Som stated okay to place PICC in right arm and that BP was okay for now. Stated to call back if BPs continued to trend down. PICC team notified, will monitor closely.

## 2013-12-12 NOTE — Progress Notes (Signed)
Advanced Heart Failure Rounding Note  PCP: Dr. John Giovanni Primary Cardiologist: Dr. Wyline Mood (former Dr. Daleen Squibb) EP: Dr [name]   Subjective:    [name] is a 48 yo female with a history of NICM s/p BiV ICD [facility]), chronic systolic HF, DM2, HTN, morbid obesity and OSA on CPAP. Had normal cardiac cath in 2006.   She presented to [facility] ED on 3/30 with progressive DOE, orthopnea, and abdominal distention despite taking her diuretics. She was placed on IV lasix and ECHO ordered showing EF 35% and RV mildly dilated. She also had some chest discomfort and CE negative. Lexiscan ordered and showed evidence of ischemia in apex and transferred to [facility].  [facility] 12/12/13 RA 38/37 (35)  RV 90/28  PA 95/51 (71)   PCWP 57/73 (54)  LV 127/50   AO 123/66 (100)  Oxygen saturations:  PA 40%  AO 96% Cardiac Output/Index (Fick) 3.2/1.4, PVR 5.3 1. Normal coronary anatomy  Reports that she has not been able to walk more than 50 ft for the past few years without SOB. Rides in the buggy around grocery store and not able to go upstairs. +orthopnea.   FH: Father alive with HF SH: No ETOH and does not smoke, lives in [place] with 76 yr old daughter  Objective:   Weight Range:  Vital Signs:   Temp:  [98.4 F (36.9 C)-98.5 F (36.9 C)] 98.5 F (36.9 C) (04/03 0500) Pulse Rate:  [85-91] 90 (04/03 0938) Resp:  [18-20] 20 (04/03 0500) BP: (84-117)/(52-89) 107/89 mmHg (04/03 0857) SpO2:  [92 %-100 %] 92 % (04/03 0857) Weight:  [271 lb (122.925 kg)-288 lb (130.636 kg)] 288 lb (130.636 kg) (04/03 0450) Last BM Date: 12/11/13  Weight change: Filed Weights   12/11/13 0457 12/11/13 1654 12/12/13 0450  Weight: 284 lb 6.4 oz (129.003 kg) 271 lb (122.925 kg) 288 lb (130.636 kg)    Intake/Output:   Intake/Output Summary (Last 24 hours) at 12/12/13 1144 Last data filed at 12/12/13 0333  Gross per 24 hour  Intake      0 ml  Output    300 ml  Net   -300 ml     Physical  Exam: General:  Chronically ill appearing. No resp difficulty HEENT: normal Neck: supple. JVP difficult to assess d/t body habitus but appears elevated; Carotids 2+ bilat; no bruits. No lymphadenopathy or thryomegaly appreciated. Cor: Heart sounds distant. Regular rate & rhythm. No rubs, gallops or murmurs. Lungs: clear Abdomen: soft, nontender, +++distended. No hepatosplenomegaly. No bruits or masses. Good bowel sounds. Obese Extremities: no cyanosis, clubbing, rash, edema, cool extremities Neuro: alert & orientedx3, cranial nerves grossly intact. moves all 4 extremities w/o difficulty. Affect pleasant  Telemetry: V paced 85  Labs: Basic Metabolic Panel:  Recent Labs Lab 12/08/13 1028 12/09/13 0458 12/10/13 0828 12/11/13 0650 12/12/13 0320  NA 137 138 139 138 133*  K 3.1* 3.0* 4.0 4.7 5.6*  CL 88* 90* 92* 94* 94*  CO2 38* 35* 34* 35* 28  GLUCOSE 286* 301* 249* 287* 331*  BUN 13 13 17 23  27*  CREATININE 0.83 0.80 0.85 1.08 1.05  CALCIUM 8.7 8.3* 8.7 8.8 8.3*    Liver Function Tests:  Recent Labs Lab 12/10/13 0828  AST 12  ALT 9  ALKPHOS 93  BILITOT 0.5  PROT 9.0*  ALBUMIN 3.5   No results found for this basename: LIPASE, AMYLASE,  in the last 168 hours No results found for this basename: AMMONIA,  in the  last 168 hours  CBC:  Recent Labs Lab 12/08/13 1028 12/09/13 0458 12/12/13 0320  WBC 6.1 5.7 7.0  NEUTROABS 3.9  --   --   HGB 12.1 12.7 12.2  HCT 37.2 38.8 38.1  MCV 86.5 87.6 90.1  PLT 251 275 290    Cardiac Enzymes:  Recent Labs Lab 12/08/13 1028 12/10/13 0828 12/10/13 1347 12/10/13 2002  TROPONINI <0.30 <0.30 <0.30 <0.30    BNP: BNP (last 3 results)  Recent Labs  04/06/13 1549 06/10/13 0954 12/08/13 1028  PROBNP 612.1* 508.1* 948.6*     Other results:  EKG:   Imaging: Nm Myocar Single W/spect W/wall Motion And Ef  12/11/2013   CLINICAL DATA:  48 year old female with no known history of coronary artery disease referred for  chest pain.  EXAM: MYOCARDIAL IMAGING WITH SPECT (REST AND PHARMACOLOGIC-STRESS)  GATED LEFT VENTRICULAR WALL MOTION STUDY  LEFT VENTRICULAR EJECTION FRACTION  TECHNIQUE: Standard myocardial SPECT imaging was performed after resting intravenous injection of 10 mCi Tc-40m sestamibi. Subsequently, intravenous infusion of Lexiscan was performed under the supervision of the Cardiology staff. At peak effect of the drug, 30 mCi Tc-90m sestamibi was injected intravenously and standard myocardial SPECT imaging was performed. Quantitative gated imaging was also performed to evaluate left ventricular wall motion, and estimate left ventricular ejection fraction.  COMPARISON:  None.  FINDINGS: Pharmacological stress  Baseline EKG shows sinus rhythm with ventricular pacing. After injection heart rate increased to 93 beats per min up to 100 beats per min and blood pressure increased from 102/62 up to 118/68. The test was stopped after injection was complete, the patient did not experience any chest pain.  After injection, there were no ischemic EKG changes or significant arrhythmias.  Myocardial perfusion imaging  Raw images showed appropriate radiotracer uptake. There is noted varation of positioning of large pendulus breast tissue in the study. There was a small anteroseptal wall defect seen in the resting images that was less intense in the post-injection images, consistent with a breast attenuation. There was a small to moderate-sized partially reversible apical defect. There were no other perfusion defects.  Gated imaging showed end-diastolic volume 274 mL, end systolic volume 180 mL, left ventricular ejection fraction 34%. There was global hypokinesis.  IMPRESSION: 1.  Abnormal Lexiscan MPI for ischemia  2.  Apical infarct with moderate peri-infarct ischemia  3. Decreased LV systolic function, LVEF 34%. Dilated LV with global hypokinesis.  4. High risk study for major cardiac events due to low ejection fraction, small to  moderate area of myocardium at jeopardy.   Electronically Signed   By: Dina Rich   On: 12/11/2013 14:56      Medications:     Scheduled Medications: . [facility] HOLD] aspirin EC  81 mg Oral Daily  . [MAR HOLD] benazepril  40 mg Oral Daily  . diazepam  5 mg Oral On Call  . [facility] HOLD] docusate sodium  100 mg Oral Daily  . [facility] HOLD] ferrous sulfate  325 mg Oral TID  . furosemide  80 mg Intravenous 3 times per day  . [facility] HOLD] gabapentin  300 mg Oral BID  . [facility] HOLD] hydrALAZINE  25 mg Oral TID  . [MAR HOLD] insulin aspart  0-15 Units Subcutaneous TID WC  . [MAR HOLD] insulin aspart  0-9 Units Subcutaneous 6 times per day  . [MAR HOLD] insulin detemir  90 Units Subcutaneous QHS  . [facility] HOLD] nitroGLYCERIN  0.5 inch Topical 4 times per day  . [facility]  HOLD] pantoprazole  40 mg Oral Daily  . [MAR HOLD] potassium chloride SA  40 mEq Oral BID  . [facility][MAR HOLD] regadenoson  0.4 mg Intravenous Once  . [facility][MAR HOLD] sodium chloride  3 mL Intravenous Q12H  . sodium chloride  3 mL Intravenous Q12H  . sodium chloride  3 mL Intravenous Q12H     Infusions: . sodium chloride 1 mL/kg/hr (12/12/13 0554)  . sodium chloride Stopped (12/12/13 0421)  . heparin 1,450 Units/hr (12/12/13 0555)     PRN Medications:  [MAR HOLD] sodium chloride, sodium chloride, sodium chloride, [MAR HOLD] acetaminophen, [MAR HOLD] acetaminophen, [MAR HOLD] albuterol, [MAR HOLD] cyclobenzaprine, [MAR HOLD] HYDROcodone-acetaminophen, [MAR HOLD] ondansetron (ZOFRAN) IV, [MAR HOLD] ondansetron, [MAR HOLD] oxyCODONE-acetaminophen, [MAR HOLD] sodium chloride, sodium chloride, sodium chloride, [MAR HOLD] temazepam   Assessment/Plan Discussion   1) Cardiogenic shock - 48 yo female with a history of chronic systolic HF, NICM s/p BiV ICD who [facility] to [facility] with increased SOB. Taken to cath today and has markedly elevated LV filing pressures and low CO. Will place PICC for co-ox and CVPs. Start milrinone 0.25 mcg and IV  lasix 15 mg/hr. Will hold BB now with acute decompensation. SBP 90-100s. Will cut lotensin to 10 mg daily and hold hydralazine currently. Will need to follow Cr closely. Will order CR for education and ambulation.  2) Uncontrolled DM2 - Will continue SSI and adjust accordingly. - Hgb A1C on admission was 13.0 3) OSA - Will order CPAP for while she is here  Length of Stay: 4 [name], [name] [name] 12/12/2013, 11:44 AM  Advanced Heart Failure Team Pager [phone] (M-F; 7a - 4p)  Please contact [facility] for night-coverage after hours (4p -7a ) and weekends on [email or web address]  Patient seen with NP, agree with the above note.   1. Acute on chronic systolic CHF: Nonischemic cardiomyopathy. Chronic: ?prior viral myocarditis.  EF 35% by echo but difficult study.  Markedly elevated filling pressures by cardiac catheterization today and low cardiac index.   - Will start milrinone 0.25 mcg/kg/min and start diuresis with Lasix gtt 10 mg/hr. - Decrease benazepril to 5 mg daily for now and hold hydralazine/nitrates.  - When diuresed and can lie flat, would consider cardiac MRI for better idea of EF and to look for infiltrative disease.  - Has [employer] CRT-D.  - Repeat K, ?hemolyzed.  2. OSA: CPAP.   Marca AnconaDalton McLean 12/12/2013 1:51 PM

## 2013-12-12 NOTE — Progress Notes (Signed)
Staff from 3W brought pt's belongings from prior room to current room in 2H 14. Pt's fiance states all of pt's belongings are present and in his possession. Will monitor closely.

## 2013-12-12 NOTE — Progress Notes (Signed)
Pt has had intermittent chest pain, throughout the night, with relief with pain meds.  However this morning, the chest pain became more frequently and increased in intensity.  Dr. Tresa Endo notified.  Received order for percocet.  Will continue to monitor patient.

## 2013-12-13 DIAGNOSIS — I509 Heart failure, unspecified: Secondary | ICD-10-CM

## 2013-12-13 LAB — GLUCOSE, CAPILLARY
GLUCOSE-CAPILLARY: 125 mg/dL — AB (ref 70–99)
GLUCOSE-CAPILLARY: 129 mg/dL — AB (ref 70–99)
GLUCOSE-CAPILLARY: 191 mg/dL — AB (ref 70–99)
Glucose-Capillary: 203 mg/dL — ABNORMAL HIGH (ref 70–99)
Glucose-Capillary: 200 mg/dL — ABNORMAL HIGH (ref 70–99)

## 2013-12-13 LAB — BASIC METABOLIC PANEL
BUN: 19 mg/dL (ref 6–23)
CHLORIDE: 95 meq/L — AB (ref 96–112)
CO2: 33 meq/L — AB (ref 19–32)
CREATININE: 0.82 mg/dL (ref 0.50–1.10)
Calcium: 7.7 mg/dL — ABNORMAL LOW (ref 8.4–10.5)
GFR calc Af Amer: >90 mL/min (ref >90)
GFR calc non Af Amer: 84 mL/min — ABNORMAL LOW (ref >90)
GLUCOSE: 140 mg/dL — AB (ref 70–99)
POTASSIUM: 3.7 meq/L (ref 3.7–5.3)
Sodium: 141 mEq/L (ref 137–147)

## 2013-12-13 LAB — CARBOXYHEMOGLOBIN
Carboxyhemoglobin: 1.8 % — ABNORMAL HIGH (ref 0.5–1.5)
Methemoglobin: 0.8 % (ref 0.0–1.5)
O2 Saturation: 86.4 %
Total hemoglobin: 10.4 g/dL — ABNORMAL LOW (ref 12.0–16.0)

## 2013-12-13 MED ORDER — POTASSIUM CHLORIDE CRYS ER 20 MEQ PO TBCR
40.0000 meq | EXTENDED_RELEASE_TABLET | Freq: Once | ORAL | Status: AC
  Administered 2013-12-13: 40 meq via ORAL

## 2013-12-13 MED ORDER — SPIRONOLACTONE 12.5 MG HALF TABLET
12.5000 mg | ORAL_TABLET | Freq: Every day | ORAL | Status: DC
  Administered 2013-12-13 – 2013-12-14 (×2): 12.5 mg via ORAL
  Filled 2013-12-13 (×3): qty 1

## 2013-12-13 NOTE — Progress Notes (Signed)
Patient ID: SAOIRSE LEGERE, female   DOB: 1965-12-25, 48 y.o.   MRN: [id number] Advanced Heart Failure Rounding Note  PCP: Dr. John Giovanni Primary Cardiologist: Dr. Wyline Mood (former Dr. Daleen Squibb) EP: Dr [name]   Subjective:    Ms. Jeremiah is a 48 yo female with a history of NICM s/p BiV ICD [facility]), chronic systolic HF, DM2, HTN, morbid obesity and OSA on CPAP. Had normal cardiac cath in 2006.   She presented to [facility] ED on 3/30 with progressive DOE, orthopnea, and abdominal distention despite taking her diuretics. She was placed on IV lasix and ECHO ordered showing EF 35% and RV mildly dilated. She also had some chest discomfort and CE negative. Lexiscan ordered and showed evidence of ischemia in apex and transferred to [facility] for [facility].  [facility] 12/12/13 RA 38/37 (35)  RV 90/28  PA 95/51 (71)   PCWP 57/73 (54)  LV 127/50   AO 123/66 (100)  Oxygen saturations:  PA 40%  AO 96% Cardiac Output/Index (Fick) 3.2/1.4, PVR 5.3 Normal coronary anatomy  Reports that she has not been able to walk more than 50 ft for the past few years without SOB. Rides in the buggy around grocery store and not able to go upstairs. +orthopnea.   She was started on milrinone and Lasix gtt yesterday.  She diuresed well, weight is down 5 lbs. She is breathing better.   FH: Father alive with HF SH: No ETOH and does not smoke, lives in [place] with 62 yr old daughter  Objective:   Weight Range:  Vital Signs:   Temp:  [97.9 F (36.6 C)-98.6 F (37 C)] 98.6 F (37 C) (04/04 0400) Pulse Rate:  [85-98] 93 (04/04 0700) Resp:  [10-24] 16 (04/04 0700) BP: (89-126)/(45-89) 126/69 mmHg (04/04 0700) SpO2:  [88 %-100 %] 93 % (04/04 0700) Weight:  [283 lb 11.7 oz (128.7 kg)] 283 lb 11.7 oz (128.7 kg) (04/04 0500) Last BM Date: 12/11/13  Weight change: Filed Weights   12/11/13 1654 12/12/13 0450 12/13/13 0500  Weight: 271 lb (122.925 kg) 288 lb (130.636 kg) 283 lb 11.7 oz (128.7 kg)     Intake/Output:   Intake/Output Summary (Last 24 hours) at 12/13/13 0745 Last data filed at 12/13/13 0600  Gross per 24 hour  Intake 474.03 ml  Output   4175 ml  Net -3700.97 ml     Physical Exam: General:  Chronically ill appearing. No resp difficulty HEENT: normal Neck: supple. JVP difficult to assess d/t body habitus but appears elevated; Carotids 2+ bilat; no bruits. No lymphadenopathy or thryomegaly appreciated. Cor: Heart sounds distant. Regular rate & rhythm. No rubs, gallops or murmurs. Lungs: clear Abdomen: soft, nontender, ++distended. No hepatosplenomegaly. No bruits or masses. Good bowel sounds. Obese Extremities: no cyanosis, clubbing, rash.  Trace ankle edema.  Neuro: alert & orientedx3, cranial nerves grossly intact. moves all 4 extremities w/o difficulty. Affect pleasant  Telemetry: NSR, BiV paced 85  Labs: Basic Metabolic Panel:  Recent Labs Lab 12/10/13 0828 12/11/13 0650 12/12/13 0320 12/12/13 1800 12/13/13 0500  NA 139 138 133* 141 141  K 4.0 4.7 5.6* 4.1 3.7  CL 92* 94* 94* 98 95*  CO2 34* 35* 28 31 33*  GLUCOSE 249* 287* 331* 124* 140*  BUN 17 23 27* 26* 19  CREATININE 0.85 1.08 1.05 0.96 0.82  CALCIUM 8.7 8.8 8.3* 8.0* 7.7*    Liver Function Tests:  Recent Labs Lab 12/10/13 0828  AST 12  ALT 9  ALKPHOS 93  BILITOT 0.5  PROT 9.0*  ALBUMIN 3.5   No results found for this basename: LIPASE, AMYLASE,  in the last 168 hours No results found for this basename: AMMONIA,  in the last 168 hours  CBC:  Recent Labs Lab 12/08/13 1028 12/09/13 0458 12/12/13 0320 12/12/13 1800  WBC 6.1 5.7 7.0 6.1  NEUTROABS 3.9  --   --   --   HGB 12.1 12.7 12.2 10.9*  HCT 37.2 38.8 38.1 33.8*  MCV 86.5 87.6 90.1 89.4  PLT 251 275 290 210    Cardiac Enzymes:  Recent Labs Lab 12/08/13 1028 12/10/13 0828 12/10/13 1347 12/10/13 2002  TROPONINI <0.30 <0.30 <0.30 <0.30    BNP: BNP (last 3 results)  Recent Labs  04/06/13 1549  06/10/13 0954 12/08/13 1028  PROBNP 612.1* 508.1* 948.6*     Other results:  EKG:   Imaging: Nm Myocar Single W/spect W/wall Motion And Ef  12/11/2013   CLINICAL DATA:  48 year old female with no known history of coronary artery disease referred for chest pain.  EXAM: MYOCARDIAL IMAGING WITH SPECT (REST AND PHARMACOLOGIC-STRESS)  GATED LEFT VENTRICULAR WALL MOTION STUDY  LEFT VENTRICULAR EJECTION FRACTION  TECHNIQUE: Standard myocardial SPECT imaging was performed after resting intravenous injection of 10 mCi Tc-5826m sestamibi. Subsequently, intravenous infusion of Lexiscan was performed under the supervision of the Cardiology staff. At peak effect of the drug, 30 mCi Tc-8126m sestamibi was injected intravenously and standard myocardial SPECT imaging was performed. Quantitative gated imaging was also performed to evaluate left ventricular wall motion, and estimate left ventricular ejection fraction.  COMPARISON:  None.  FINDINGS: Pharmacological stress  Baseline EKG shows sinus rhythm with ventricular pacing. After injection heart rate increased to 93 beats per min up to 100 beats per min and blood pressure increased from 102/62 up to 118/68. The test was stopped after injection was complete, the patient did not experience any chest pain.  After injection, there were no ischemic EKG changes or significant arrhythmias.  Myocardial perfusion imaging  Raw images showed appropriate radiotracer uptake. There is noted varation of positioning of large pendulus breast tissue in the study. There was a small anteroseptal wall defect seen in the resting images that was less intense in the post-injection images, consistent with a breast attenuation. There was a small to moderate-sized partially reversible apical defect. There were no other perfusion defects.  Gated imaging showed end-diastolic volume 274 mL, end systolic volume 180 mL, left ventricular ejection fraction 34%. There was global hypokinesis.  IMPRESSION:  1.  Abnormal Lexiscan MPI for ischemia  2.  Apical infarct with moderate peri-infarct ischemia  3. Decreased LV systolic function, LVEF 34%. Dilated LV with global hypokinesis.  4. High risk study for major cardiac events due to low ejection fraction, small to moderate area of myocardium at jeopardy.   Electronically Signed   By: Dina RichJonathan  Branch   On: 12/11/2013 14:56     Medications:     Scheduled Medications: . aspirin EC  81 mg Oral Daily  . benazepril  5 mg Oral Daily  . docusate sodium  100 mg Oral Daily  . ferrous sulfate  325 mg Oral TID  . gabapentin  300 mg Oral BID  . heparin  5,000 Units Subcutaneous 3 times per day  . insulin aspart  0-15 Units Subcutaneous TID WC  . insulin detemir  90 Units Subcutaneous QHS  . pantoprazole  40 mg Oral Daily  . potassium chloride SA  40 mEq Oral BID  .  potassium chloride  40 mEq Oral Once  . sodium chloride  10-40 mL Intracatheter Q12H  . sodium chloride  3 mL Intravenous Q12H  . sodium chloride  3 mL Intravenous Q12H  . spironolactone  12.5 mg Oral Daily    Infusions: . furosemide (LASIX) infusion 10 mg/hr (12/12/13 1900)  . milrinone 0.25 mcg/kg/min (12/12/13 2116)    PRN Medications: sodium chloride, acetaminophen, acetaminophen, albuterol, cyclobenzaprine, HYDROcodone-acetaminophen, ondansetron (ZOFRAN) IV, ondansetron, oxyCODONE-acetaminophen, sodium chloride, sodium chloride, sodium chloride, temazepam   Assessment/Plan Discussion   1. Acute on chronic systolic CHF: Nonischemic cardiomyopathy. Chronic: ?prior viral myocarditis.  EF 35% by echo but difficult study.  Markedly elevated filling pressures by cardiac catheterization Friday and low cardiac index.  She diuresed well yesterday.  Weight down 5 lbs.  Good co-ox but CVP remains 10-13.  - Continue milrinone and Lasix at current doses.  - Add spironolactone 12.5 mg daily.  - Decreased benazepril to 5 mg daily and held hydralazine/nitrates.  - No beta blocker yet.  -  When diuresed and can lie flat, would consider cardiac MRI for better idea of EF and to look for infiltrative disease.  - Has [employer] CRT-D.  2. OSA: Bipap at night.  2) Uncontrolled DM2 - Will continue SSI and adjust accordingly. - Hgb A1C on admission was 13.0 - Diabetes care consult needed.   [name] 12/13/2013 7:45 AM

## 2013-12-13 NOTE — Progress Notes (Signed)
Placed pt. On CPAP 10cm H2O via FFM (what pt. Wears at home). Pt. Is tolerating CPAP well at this time without any complications.

## 2013-12-13 NOTE — Progress Notes (Signed)
Inpatient Diabetes Program Recommendations  AACE/ADA: New Consensus Statement on Inpatient Glycemic Control (2013)  Target Ranges:  Prepandial:   less than 140 mg/dL      Peak postprandial:   less than 180 mg/dL (1-2 hours)      Critically ill patients:  140 - 180 mg/dL   Inpatient Diabetes Program Recommendations Insulin - Basal: Levemir effective at controlling cbg's and basal needs.  While NPO the levemir controlled glucose beautifullly.  Pt needs addition of meal coverage Correction (SSI): xxxxx Insulin - Meal Coverage: Please add 4 units meal coverage in addition to correction as ordered. HgbA1C: Noted note per MD regarding A1C of 13%.  That value is from 2013  Please order a present one.  Thank you, Lenor Coffin, RN, CNS, Diabetes Coordinator [phone])

## 2013-12-13 NOTE — Progress Notes (Signed)
CARDIAC REHAB PHASE I   PRE:  Rate/Rhythm: 97 paced  BP:  Supine: 119/95  Sitting:   Standing:    SaO2: 97 RA  MODE:  Ambulation: 200 ft   POST:  Rate/Rhythm: 95 paced with PVC's  BP:  Supine:   Sitting: 107/63  Standing:    SaO2: 96 RA Tolerated ambulation well with assistance x 1, first time up walking since admission.  Balance steady, just needs assistance due to her equipment.  Education started re: CHF, daily weights, sodium and fluid restrictions, when to call the doctor.  Patient seems interested in information.   [id number] Cindra Eves RN, BSN 12/13/2013 11:19 AM

## 2013-12-14 LAB — BASIC METABOLIC PANEL
BUN: 13 mg/dL (ref 6–23)
CALCIUM: 7.7 mg/dL — AB (ref 8.4–10.5)
CO2: 34 meq/L — AB (ref 19–32)
CREATININE: 0.78 mg/dL (ref 0.50–1.10)
Chloride: 93 mEq/L — ABNORMAL LOW (ref 96–112)
GFR calc Af Amer: >90 mL/min (ref >90)
GFR calc non Af Amer: >90 mL/min (ref >90)
Glucose, Bld: 168 mg/dL — ABNORMAL HIGH (ref 70–99)
Potassium: 3.6 mEq/L — ABNORMAL LOW (ref 3.7–5.3)
Sodium: 139 mEq/L (ref 137–147)

## 2013-12-14 LAB — CARBOXYHEMOGLOBIN
Carboxyhemoglobin: 2 % — ABNORMAL HIGH (ref 0.5–1.5)
Methemoglobin: 1 % (ref 0.0–1.5)
O2 Saturation: 72.6 %
TOTAL HEMOGLOBIN: 10.2 g/dL — AB (ref 12.0–16.0)

## 2013-12-14 LAB — GLUCOSE, CAPILLARY
Glucose-Capillary: 152 mg/dL — ABNORMAL HIGH (ref 70–99)
Glucose-Capillary: 163 mg/dL — ABNORMAL HIGH (ref 70–99)
Glucose-Capillary: 168 mg/dL — ABNORMAL HIGH (ref 70–99)
Glucose-Capillary: 194 mg/dL — ABNORMAL HIGH (ref 70–99)

## 2013-12-14 LAB — HEMOGLOBIN A1C
HEMOGLOBIN A1C: 9.9 % — AB (ref <5.7)
MEAN PLASMA GLUCOSE: 237 mg/dL — AB (ref <117)

## 2013-12-14 MED ORDER — INSULIN ASPART 100 UNIT/ML ~~LOC~~ SOLN
4.0000 [IU] | Freq: Three times a day (TID) | SUBCUTANEOUS | Status: DC
  Administered 2013-12-14 – 2013-12-16 (×7): 4 [IU] via SUBCUTANEOUS

## 2013-12-14 MED ORDER — BENAZEPRIL HCL 5 MG PO TABS
5.0000 mg | ORAL_TABLET | Freq: Two times a day (BID) | ORAL | Status: DC
  Administered 2013-12-14: 5 mg via ORAL
  Filled 2013-12-14 (×3): qty 1

## 2013-12-14 MED ORDER — SPIRONOLACTONE 12.5 MG HALF TABLET
12.5000 mg | ORAL_TABLET | Freq: Once | ORAL | Status: AC
  Administered 2013-12-14: 12.5 mg via ORAL
  Filled 2013-12-14: qty 1

## 2013-12-14 MED ORDER — SPIRONOLACTONE 25 MG PO TABS
25.0000 mg | ORAL_TABLET | Freq: Every day | ORAL | Status: DC
  Administered 2013-12-15 – 2013-12-16 (×2): 25 mg via ORAL
  Filled 2013-12-14 (×2): qty 1

## 2013-12-14 NOTE — Progress Notes (Signed)
Patient ID: [name], female   DOB: 28-May-1966, 48 y.o.   MRN: [id number] Advanced Heart Failure Rounding Note  PCP: Dr. John GiovanniStephen [name] Primary Cardiologist: Dr. Wyline MoodBranch (former Dr. Daleen SquibbWall) EP: Dr [name]   Subjective:    [name] is a 48 yo female with a history of NICM s/p BiV ICD [facility]([employer]), chronic systolic HF, DM2, HTN, morbid obesity and OSA on CPAP. Had normal cardiac cath in 2006.   She presented to [facility] ED on 3/30 with progressive DOE, orthopnea, and abdominal distention despite taking her diuretics. She was placed on IV lasix and ECHO ordered showing EF 35% and RV mildly dilated. She also had some chest discomfort and CE negative. Lexiscan ordered and showed evidence of ischemia in apex and [facility].  [facility]HC 12/12/13 RA 38/37 (35)  RV 90/28  PA 95/51 (71)   PCWP 57/73 (54)  LV 127/50   AO 123/66 (100)  Oxygen saturations:  PA 40%  AO 96% Cardiac Output/Index (Fick) 3.2/1.4, PVR 5.3 Normal coronary anatomy  Reports that she has not been able to walk more than 50 ft for the past few years without SOB. Rides in the buggy around grocery store and not able to go upstairs. +orthopnea.   She was started on milrinone and Lasix gtt Friday.  She has diuresed well, weight falling. She is breathing better. CVP 10 this morning.   FH: Father alive with HF SH: No ETOH and does not smoke, lives in [place] with 48 yr old daughter  Objective:   Weight Range:  Vital Signs:   Temp:  [97.4 F (36.3 C)-99 F (37.2 C)] 98.2 F (36.8 C) (04/05 0835) Pulse Rate:  [101] 101 (04/04 2157) Resp:  [10-60] 25 (04/05 0700) BP: (95-146)/(49-84) 146/84 mmHg (04/05 0835) SpO2:  [94 %-95 %] 94 % (04/05 0400) Weight:  [281 lb 15.5 oz (127.9 kg)] 281 lb 15.5 oz (127.9 kg) (04/05 0413) Last BM Date: 12/14/13  Weight change: Filed Weights   12/12/13 0450 12/13/13 0500 12/14/13 0413  Weight: 288 lb (130.636 kg) 283 lb 11.7 oz (128.7 kg) 281 lb 15.5 oz  (127.9 kg)    Intake/Output:   Intake/Output Summary (Last 24 hours) at 12/14/13 1007 Last data filed at 12/14/13 0900  Gross per 24 hour  Intake 1525.4 ml  Output   2900 ml  Net -1374.6 ml     Physical Exam: General:  Chronically ill appearing. No resp difficulty HEENT: normal Neck: supple. JVP difficult to assess d/t body habitus but appears elevated; Carotids 2+ bilat; no bruits. No lymphadenopathy or thryomegaly appreciated. Cor: Heart sounds distant. Regular rate & rhythm. No rubs, gallops or murmurs. Lungs: clear Abdomen: soft, nontender, +distended. No hepatosplenomegaly. No bruits or masses. Good bowel sounds. Obese Extremities: no cyanosis, clubbing, rash.  Trace ankle edema.  Neuro: alert & orientedx3, cranial nerves grossly intact. moves all 4 extremities w/o difficulty. Affect pleasant  Telemetry: NSR, BiV paced 85  Labs: Basic Metabolic Panel:  Recent Labs Lab 12/11/13 0650 12/12/13 0320 12/12/13 1800 12/13/13 0500 12/14/13 0500  NA 138 133* 141 141 139  K 4.7 5.6* 4.1 3.7 3.6*  CL 94* 94* 98 95* 93*  CO2 35* 28 31 33* 34*  GLUCOSE 287* 331* 124* 140* 168*  BUN 23 27* 26* 19 13  CREATININE 1.08 1.05 0.96 0.82 0.78  CALCIUM 8.8 8.3* 8.0* 7.7* 7.7*    Liver Function Tests:  Recent Labs Lab 12/10/13 0828  AST 12  ALT  9  ALKPHOS 93  BILITOT 0.5  PROT 9.0*  ALBUMIN 3.5   No results found for this basename: LIPASE, AMYLASE,  in the last 168 hours No results found for this basename: AMMONIA,  in the last 168 hours  CBC:  Recent Labs Lab 12/08/13 1028 12/09/13 0458 12/12/13 0320 12/12/13 1800  WBC 6.1 5.7 7.0 6.1  NEUTROABS 3.9  --   --   --   HGB 12.1 12.7 12.2 10.9*  HCT 37.2 38.8 38.1 33.8*  MCV 86.5 87.6 90.1 89.4  PLT 251 275 290 210    Cardiac Enzymes:  Recent Labs Lab 12/08/13 1028 12/10/13 0828 12/10/13 1347 12/10/13 2002  TROPONINI <0.30 <0.30 <0.30 <0.30    BNP: BNP (last 3 results)  Recent Labs  04/06/13 1549  06/10/13 0954 12/08/13 1028  PROBNP 612.1* 508.1* 948.6*     Other results:  EKG:   Imaging: No results found.   Medications:     Scheduled Medications: . aspirin EC  81 mg Oral Daily  . benazepril  5 mg Oral BID  . docusate sodium  100 mg Oral Daily  . ferrous sulfate  325 mg Oral TID  . gabapentin  300 mg Oral BID  . heparin  5,000 Units Subcutaneous 3 times per day  . insulin aspart  0-15 Units Subcutaneous TID WC  . insulin aspart  4 Units Subcutaneous TID WC  . insulin detemir  90 Units Subcutaneous QHS  . pantoprazole  40 mg Oral Daily  . potassium chloride SA  40 mEq Oral BID  . sodium chloride  10-40 mL Intracatheter Q12H  . sodium chloride  3 mL Intravenous Q12H  . sodium chloride  3 mL Intravenous Q12H  . spironolactone  12.5 mg Oral Daily  . spironolactone  12.5 mg Oral Once  . [START ON 12/15/2013] spironolactone  25 mg Oral Daily    Infusions: . furosemide (LASIX) infusion 10 mg/hr (12/13/13 2000)  . milrinone 0.25 mcg/kg/min (12/14/13 0909)    PRN Medications: sodium chloride, acetaminophen, acetaminophen, albuterol, cyclobenzaprine, HYDROcodone-acetaminophen, ondansetron (ZOFRAN) IV, ondansetron, oxyCODONE-acetaminophen, sodium chloride, sodium chloride, sodium chloride, temazepam   Assessment/Plan Discussion   1. Acute on chronic systolic CHF: Nonischemic cardiomyopathy. Chronic: ?prior viral myocarditis.  EF 35% by echo but difficult study.  Markedly elevated filling pressures by cardiac catheterization Friday and low cardiac index.  She diuresed well yesterday again.  Weight down.  Good co-ox but CVP remains 10.  - Continue milrinone and Lasix gtt at 15 mg/hr. Suspect she will wean off milrinone reasonably well after diuresis.  - Increase spironolactone to 25 mg daily.  - Increase benazepril to 5 mg bid.  - No beta blocker yet.  - When diuresed and can lie flat, would consider cardiac MRI for better idea of EF and to look for infiltrative disease.   - Has [employer] CRT-D.  2. OSA: Bipap at night.  3. Diabetes: Appareciate recs from diabetes service, will follow.   [name] 12/14/2013 10:07 AM

## 2013-12-15 LAB — BASIC METABOLIC PANEL
BUN: 10 mg/dL (ref 6–23)
CO2: 35 meq/L — AB (ref 19–32)
Calcium: 8.7 mg/dL (ref 8.4–10.5)
Chloride: 93 mEq/L — ABNORMAL LOW (ref 96–112)
Creatinine, Ser: 0.8 mg/dL (ref 0.50–1.10)
GFR calc Af Amer: >90 mL/min (ref >90)
GFR calc non Af Amer: 86 mL/min — ABNORMAL LOW (ref >90)
GLUCOSE: 127 mg/dL — AB (ref 70–99)
Potassium: 3.4 mEq/L — ABNORMAL LOW (ref 3.7–5.3)
Sodium: 141 mEq/L (ref 137–147)

## 2013-12-15 LAB — CARBOXYHEMOGLOBIN
Carboxyhemoglobin: 1.5 % (ref 0.5–1.5)
Methemoglobin: 0.6 % (ref 0.0–1.5)
O2 SAT: 60.4 %
Total hemoglobin: 10.9 g/dL — ABNORMAL LOW (ref 12.0–16.0)

## 2013-12-15 LAB — GLUCOSE, CAPILLARY
GLUCOSE-CAPILLARY: 269 mg/dL — AB (ref 70–99)
GLUCOSE-CAPILLARY: 246 mg/dL — AB (ref 70–99)
GLUCOSE-CAPILLARY: 152 mg/dL — AB (ref 70–99)
Glucose-Capillary: 167 mg/dL — ABNORMAL HIGH (ref 70–99)
Glucose-Capillary: 122 mg/dL — ABNORMAL HIGH (ref 70–99)
Glucose-Capillary: 191 mg/dL — ABNORMAL HIGH (ref 70–99)

## 2013-12-15 MED ORDER — MILRINONE IN DEXTROSE 20 MG/100ML IV SOLN
0.1250 ug/kg/min | INTRAVENOUS | Status: DC
  Administered 2013-12-15: 0.125 ug/kg/min via INTRAVENOUS
  Filled 2013-12-15: qty 100

## 2013-12-15 MED ORDER — TORSEMIDE 20 MG PO TABS
40.0000 mg | ORAL_TABLET | Freq: Two times a day (BID) | ORAL | Status: DC
  Administered 2013-12-15 – 2013-12-16 (×3): 40 mg via ORAL
  Filled 2013-12-15 (×6): qty 2

## 2013-12-15 MED ORDER — BENAZEPRIL HCL 10 MG PO TABS
10.0000 mg | ORAL_TABLET | Freq: Two times a day (BID) | ORAL | Status: DC
  Administered 2013-12-15 – 2013-12-16 (×3): 10 mg via ORAL
  Filled 2013-12-15 (×4): qty 1

## 2013-12-15 NOTE — Progress Notes (Signed)
CARDIAC REHAB PHASE I   PRE:  Rate/Rhythm: 99 Pacing  BP:  Supine:   Sitting: 122/68  Standing:    SaO2: 98 RA  MODE:  Ambulation: 350 ft   POST:  Rate/Rhythm: 112 ST  BP:  Supine:   Sitting: 131/76  Standing:    SaO2: 98 RA 1040-1140 Assisted X 1 to ambulate to push IV pole. Gait steady, VS stable Pt able to walk 350 feet without c/o. Pt back to recliner after walk. We discussed more about CHF, fluid restrictions, low sodium and low carb. Pt agrees to Outpt. CRP in [place], will send referral.  Melina Copa RN 12/15/2013 11:39 AM

## 2013-12-15 NOTE — Progress Notes (Signed)
Patient ID: [name], female   DOB: [birth year], 48 y.o.   MRN: [id number] Advanced Heart Failure Rounding Note  PCP: Dr. John GiovanniStephen Knowlton Primary Cardiologist: Dr. Wyline MoodBranch (former Dr. Daleen SquibbWall) EP: Dr Ladona Ridgelaylor   Subjective:    [name] is a 48 yo female with a history of NICM s/p BiV ICD [facility]([employer]), chronic systolic HF, DM2, HTN, morbid obesity and OSA on CPAP. Had normal cardiac cath in 2006.   [facility] on 3/30 with progressive DOE, orthopnea, and abdominal distention despite taking her diuretics. She was placed on IV lasix and ECHO ordered showing EF 35% and RV mildly dilated. She also had some chest discomfort and CE negative. Lexiscan ordered and showed evidence of ischemia in apex and [facility].  [facility] 12/12/13 RA 38/37 (35)  RV 90/28  PA 95/51 (71)   PCWP 57/73 (54)  LV 127/50   AO 123/66 (100)  Oxygen saturations:  PA 40%  AO 96% Cardiac Output/Index (Fick) 3.2/1.4, PVR 5.3 Normal coronary anatomy  Reports that she has not been able to walk more than 50 ft for the past few years without SOB. Rides in the buggy around grocery store and not able to go upstairs. +orthopnea.   She was started on milrinone and Lasix gtt Friday. Currently on lasix 15/hr and milrinone 0.25.  Diuresing well - down 10 pounds over night. BP 139/91 this am. Co-ox 60%. Ambulating the halls without difficulty. CVP 5-6 (checked personally)   FH: Father alive with HF SH: No ETOH and does not smoke, [place] in [place] 48 yr old daughter  Objective:   Weight Range:  Vital Signs:   Temp:  [98.2 F (36.8 C)-99 F (37.2 C)] 98.2 F (36.8 C) (04/06 0400) Pulse Rate:  [88-123] 95 (04/06 0015) Resp:  [16-24] 20 (04/06 0600) BP: (113-183)/(74-103) 113/76 mmHg (04/06 0015) SpO2:  [98 %-100 %] 99 % (04/06 0015) Weight:  [123.1 kg (271 lb 6.2 oz)] 123.1 kg (271 lb 6.2 oz) (04/06 0600) Last BM Date: 12/14/13  Weight change: Filed Weights   12/13/13  0500 12/14/13 0413 12/15/13 0600  Weight: 128.7 kg (283 lb 11.7 oz) 127.9 kg (281 lb 15.5 oz) 123.1 kg (271 lb 6.2 oz)    Intake/Output:   Intake/Output Summary (Last 24 hours) at 12/15/13 0810 Last data filed at 12/15/13 0700  Gross per 24 hour  Intake 1499.57 ml  Output   5000 ml  Net -3500.43 ml     Physical Exam: General:  Sitting in chair. No resp difficulty HEENT: normal Neck: supple. JVP difficult to assess d/t body habitus; Carotids 2+ bilat; no bruits. No lymphadenopathy or thryomegaly appreciated. Cor: Heart sounds distant. Regular rate & rhythm. 2/6 TR + s3 Lungs: clear Abdomen: soft, obese nontender, nondistended. No hepatosplenomegaly. No bruits or masses. Good bowel sounds. Obese Extremities: no cyanosis, clubbing, rash.  Trace ankle edema.  Neuro: alert & orientedx3, cranial nerves grossly intact. moves all 4 extremities w/o difficulty. Affect pleasant  Telemetry: NSR, BiV paced 95-100  Labs: Basic Metabolic Panel:  Recent Labs Lab 12/12/13 0320 12/12/13 1800 12/13/13 0500 12/14/13 0500 12/15/13 0446  NA 133* 141 141 139 141  K 5.6* 4.1 3.7 3.6* 3.4*  CL 94* 98 95* 93* 93*  CO2 28 31 33* 34* 35*  GLUCOSE 331* 124* 140* 168* 127*  BUN 27* 26* 19 13 10   CREATININE 1.05 0.96 0.82 0.78 0.80  CALCIUM 8.3* 8.0* 7.7* 7.7* 8.7  Liver Function Tests:  Recent Labs Lab 12/10/13 0828  AST 12  ALT 9  ALKPHOS 93  BILITOT 0.5  PROT 9.0*  ALBUMIN 3.5   No results found for this basename: LIPASE, AMYLASE,  in the last 168 hours No results found for this basename: AMMONIA,  in the last 168 hours  CBC:  Recent Labs Lab 12/08/13 1028 12/09/13 0458 12/12/13 0320 12/12/13 1800  WBC 6.1 5.7 7.0 6.1  NEUTROABS 3.9  --   --   --   HGB 12.1 12.7 12.2 10.9*  HCT 37.2 38.8 38.1 33.8*  MCV 86.5 87.6 90.1 89.4  PLT 251 275 290 210    Cardiac Enzymes:  Recent Labs Lab 12/08/13 1028 12/10/13 0828 12/10/13 1347 12/10/13 2002  TROPONINI <0.30 <0.30  <0.30 <0.30    BNP: BNP (last 3 results)  Recent Labs  04/06/13 1549 06/10/13 0954 12/08/13 1028  PROBNP 612.1* 508.1* 948.6*     Other results:   Imaging: No results found.   Medications:     Scheduled Medications: . aspirin EC  81 mg Oral Daily  . benazepril  5 mg Oral BID  . docusate sodium  100 mg Oral Daily  . ferrous sulfate  325 mg Oral TID  . gabapentin  300 mg Oral BID  . heparin  5,000 Units Subcutaneous 3 times per day  . insulin aspart  0-15 Units Subcutaneous TID WC  . insulin aspart  4 Units Subcutaneous TID WC  . insulin detemir  90 Units Subcutaneous QHS  . pantoprazole  40 mg Oral Daily  . potassium chloride SA  40 mEq Oral BID  . sodium chloride  10-40 mL Intracatheter Q12H  . sodium chloride  3 mL Intravenous Q12H  . sodium chloride  3 mL Intravenous Q12H  . spironolactone  12.5 mg Oral Daily  . spironolactone  25 mg Oral Daily    Infusions: . furosemide (LASIX) infusion 15 mg/hr (12/15/13 0427)  . milrinone 0.25 mcg/kg/min (12/14/13 2128)    PRN Medications: sodium chloride, acetaminophen, acetaminophen, albuterol, cyclobenzaprine, HYDROcodone-acetaminophen, ondansetron (ZOFRAN) IV, ondansetron, oxyCODONE-acetaminophen, sodium chloride, sodium chloride, sodium chloride, temazepam   Assessment/Plan Discussion   1. Acute on chronic systolic CHF: Nonischemic cardiomyopathy. Chronic: ?prior viral myocarditis.  EF 35% by echo but difficult study.  Markedly elevated filling pressures by cardiac catheterization Friday and low cardiac index.  She diuresed well yesterday again.  Weight down. --Markedly improved. CVP now 5-6. Will stop IV lasix and wean milrinone to 0.125 --Switch to demadex to 40 bid.  --increase benazepril --No beta-blocker yet --supp K+ --Follow co-ox --Trick will be figuring out how to keep her dry. Long talk about need for fluid restriction and sliding scale diuretic regimen.  --If co-ox >55% tomorrow can d/c milrinone.  Home later in week. --Consider Cardiomems --Not candidate for MRI due to ICD  2. OSA: Bipap at night.  3. Diabetes: Appareciate recs from diabetes service, will follow.  4. Morbid obesity  5. Hypokalemia  6. DM2  Holly Bodily 12/15/2013 8:10 AM

## 2013-12-15 NOTE — Progress Notes (Signed)
Pt. States that she is now ready for her CPAP. Pt. Was placed on CPAP 10cm H2O via FFM (what pt. Wears at home). Pt. Is tolerating CPAP well at this time without any complications.

## 2013-12-16 ENCOUNTER — Encounter (HOSPITAL_COMMUNITY): Payer: Self-pay | Admitting: Anesthesiology

## 2013-12-16 LAB — GLUCOSE, CAPILLARY
GLUCOSE-CAPILLARY: 144 mg/dL — AB (ref 70–99)
GLUCOSE-CAPILLARY: 102 mg/dL — AB (ref 70–99)

## 2013-12-16 LAB — BASIC METABOLIC PANEL
BUN: 12 mg/dL (ref 6–23)
CHLORIDE: 95 meq/L — AB (ref 96–112)
CO2: 33 mEq/L — ABNORMAL HIGH (ref 19–32)
Calcium: 8.4 mg/dL (ref 8.4–10.5)
Creatinine, Ser: 0.87 mg/dL (ref 0.50–1.10)
GFR calc Af Amer: >90 mL/min (ref >90)
GFR, EST NON AFRICAN AMERICAN: 78 mL/min — AB (ref >90)
GLUCOSE: 147 mg/dL — AB (ref 70–99)
POTASSIUM: 4 meq/L (ref 3.7–5.3)
SODIUM: 141 meq/L (ref 137–147)

## 2013-12-16 LAB — CARBOXYHEMOGLOBIN
Carboxyhemoglobin: 1.3 % (ref 0.5–1.5)
Methemoglobin: 0.8 % (ref 0.0–1.5)
O2 SAT: 60 %
Total hemoglobin: 12.6 g/dL (ref 12.0–16.0)

## 2013-12-16 MED ORDER — POTASSIUM CHLORIDE CRYS ER 20 MEQ PO TBCR: 40.0000 meq | EXTENDED_RELEASE_TABLET | Freq: Every day | ORAL | Status: DC

## 2013-12-16 MED ORDER — BENAZEPRIL HCL 20 MG PO TABS: 10.0000 mg | ORAL_TABLET | Freq: Two times a day (BID) | ORAL | Status: DC

## 2013-12-16 MED ORDER — DIGOXIN 125 MCG PO TABS
0.1250 mg | ORAL_TABLET | Freq: Every day | ORAL | Status: DC
Start: 2013-12-16 — End: 2013-12-16
  Administered 2013-12-16: 0.125 mg via ORAL
  Filled 2013-12-16 (×2): qty 1

## 2013-12-16 MED ORDER — CARVEDILOL 3.125 MG PO TABS: 3.1250 mg | ORAL_TABLET | Freq: Two times a day (BID) | ORAL | Status: DC

## 2013-12-16 MED ORDER — ASPIRIN 81 MG PO TBEC: 81.0000 mg | DELAYED_RELEASE_TABLET | Freq: Every day | ORAL | Status: DC

## 2013-12-16 MED ORDER — CARVEDILOL 3.125 MG PO TABS
3.1250 mg | ORAL_TABLET | Freq: Two times a day (BID) | ORAL | Status: DC
  Administered 2013-12-16: 3.125 mg via ORAL
  Filled 2013-12-16 (×5): qty 1

## 2013-12-16 MED ORDER — DIGOXIN 125 MCG PO TABS: 0.1250 mg | ORAL_TABLET | Freq: Every day | ORAL | Status: DC

## 2013-12-16 MED ORDER — TORSEMIDE 20 MG PO TABS: 40.0000 mg | ORAL_TABLET | Freq: Two times a day (BID) | ORAL | Status: DC

## 2013-12-16 NOTE — Discharge Instructions (Signed)
Heart Failure °Heart failure is a condition in which the heart has trouble pumping blood. This means your heart does not pump blood efficiently for your body to work well. In some cases of heart failure, fluid may back up into your lungs or you may have swelling (edema) in your lower legs. Heart failure is usually a long-term (chronic) condition. It is important for you to take good care of yourself and follow your caregiver's treatment plan. °CAUSES  °Some health conditions can cause heart failure. Those health conditions include: °· High blood pressure (hypertension) causes the heart muscle to work harder than normal. When pressure in the blood vessels is high, the heart needs to pump (contract) with more force in order to circulate blood throughout the body. High blood pressure eventually causes the heart to become stiff and weak. °· Coronary artery disease (CAD) is the buildup of cholesterol and fat (plaque) in the arteries of the heart. The blockage in the arteries deprives the heart muscle of oxygen and blood. This can cause chest pain and may lead to a heart attack. High blood pressure can also contribute to CAD. °· Heart attack (myocardial infarction) occurs when 1 or more arteries in the heart become blocked. The loss of oxygen damages the muscle tissue of the heart. When this happens, part of the heart muscle dies. The injured tissue does not contract as well and weakens the heart's ability to pump blood. °· Abnormal heart valves can cause heart failure when the heart valves do not open and close properly. This makes the heart muscle pump harder to keep the blood flowing. °· Heart muscle disease (cardiomyopathy or myocarditis) is damage to the heart muscle from a variety of causes. These can include drug or alcohol abuse, infections, or unknown reasons. These can increase the risk of heart failure. °· Lung disease makes the heart work harder because the lungs do not work properly. This can cause a strain  on the heart, leading it to fail. °· Diabetes increases the risk of heart failure. High blood sugar contributes to high fat (lipid) levels in the blood. Diabetes can also cause slow damage to tiny blood vessels that carry important nutrients to the heart muscle. When the heart does not get enough oxygen and food, it can cause the heart to become weak and stiff. This leads to a heart that does not contract efficiently. °· Other conditions can contribute to heart failure. These include abnormal heart rhythms, thyroid problems, and low blood counts (anemia). °Certain unhealthy behaviors can increase the risk of heart failure. Those unhealthy behaviors include: °· Being overweight. °· Smoking or chewing tobacco. °· Eating foods high in fat and cholesterol. °· Abusing illicit drugs or alcohol. °· Lacking physical activity. °SYMPTOMS  °Heart failure symptoms may vary and can be hard to detect. Symptoms may include: °· Shortness of breath with activity, such as climbing stairs. °· Persistent cough. °· Swelling of the feet, ankles, legs, or abdomen. °· Unexplained weight gain. °· Difficulty breathing when lying flat (orthopnea). °· Waking from sleep because of the need to sit up and get more air. °· Rapid heartbeat. °· Fatigue and loss of energy. °· Feeling lightheaded, dizzy, or close to fainting. °· Loss of appetite. °· Nausea. °· Increased urination during the night (nocturia). °DIAGNOSIS  °A diagnosis of heart failure is based on your history, symptoms, physical examination, and diagnostic tests. °Diagnostic tests for heart failure may include: °· Echocardiography. °· Electrocardiography. °· Chest X-ray. °· Blood tests. °· Exercise   stress test. °· Cardiac angiography. °· Radionuclide scans. °TREATMENT  °Treatment is aimed at managing the symptoms of heart failure. Medicines, behavioral changes, or surgical intervention may be necessary to treat heart failure. °· Medicines to help treat heart failure may  include: °· Angiotensin-converting enzyme (ACE) inhibitors. This type of medicine blocks the effects of a blood protein called angiotensin-converting enzyme. ACE inhibitors relax (dilate) the blood vessels and help lower blood pressure. °· Angiotensin receptor blockers. This type of medicine blocks the actions of a blood protein called angiotensin. Angiotensin receptor blockers dilate the blood vessels and help lower blood pressure. °· Water pills (diuretics). Diuretics cause the kidneys to remove salt and water from the blood. The extra fluid is removed through urination. This loss of extra fluid lowers the volume of blood the heart pumps. °· Beta blockers. These prevent the heart from beating too fast and improve heart muscle strength. °· Digitalis. This increases the force of the heartbeat. °· Healthy behavior changes include: °· Obtaining and maintaining a healthy weight. °· Stopping smoking or chewing tobacco. °· Eating heart healthy foods. °· Limiting or avoiding alcohol. °· Stopping illicit drug use. °· Physical activity as directed by your caregiver. °· Surgical treatment for heart failure may include: °· A procedure to open blocked arteries, repair damaged heart valves, or remove damaged heart muscle tissue. °· A pacemaker to improve heart muscle function and control certain abnormal heart rhythms. °· An internal cardioverter defibrillator to treat certain serious abnormal heart rhythms. °· A left ventricular assist device to assist the pumping ability of the heart. °HOME CARE INSTRUCTIONS  °· Take your medicine as directed by your caregiver. Medicines are important in reducing the workload of your heart, slowing the progression of heart failure, and improving your symptoms. °· Do not stop taking your medicine unless directed by your caregiver. °· Do not skip any dose of medicine. °· Refill your prescriptions before you run out of medicine. Your medicines are needed every day. °· Take over-the-counter  medicine only as directed by your caregiver or pharmacist. °· Engage in moderate physical activity if directed by your caregiver. Moderate physical activity can benefit some people. The elderly and people with severe heart failure should consult with a caregiver for physical activity recommendations. °· Eat heart healthy foods. Food choices should be free of trans fat and low in saturated fat, cholesterol, and salt (sodium). Healthy choices include fresh or frozen fruits and vegetables, fish, lean meats, legumes, fat-free or low-fat dairy products, and whole grain or high fiber foods. Talk to a dietitian to learn more about heart healthy foods. °· Limit sodium if directed by your caregiver. Sodium restriction may reduce symptoms of heart failure in some people. Talk to a dietitian to learn more about heart healthy seasonings. °· Use healthy cooking methods. Healthy cooking methods include roasting, grilling, broiling, baking, poaching, steaming, or stir-frying. Talk to a dietitian to learn more about healthy cooking methods. °· Limit fluids if directed by your caregiver. Fluid restriction may reduce symptoms of heart failure in some people. °· Weigh yourself every day. Daily weights are important in the early recognition of excess fluid. You should weigh yourself every morning after you urinate and before you eat breakfast. Wear the same amount of clothing each time you weigh yourself. Record your daily weight. Provide your caregiver with your weight record. °· Monitor and record your blood pressure if directed by your caregiver. °· Check your pulse if directed by your caregiver. °· Lose weight if directed   by your caregiver. Weight loss may reduce symptoms of heart failure in some people.  Stop smoking or chewing tobacco. Nicotine makes your heart work harder by causing your blood vessels to constrict. Do not use nicotine gum or patches before talking to your caregiver.  Schedule and attend follow-up visits as  directed by your caregiver. It is important to keep all your appointments.  Limit alcohol intake to no more than 1 drink per day for nonpregnant women and 2 drinks per day for men. Drinking more than that is harmful to your heart. Tell your caregiver if you drink alcohol several times a week. Talk with your caregiver about whether alcohol is safe for you. If your heart has already been damaged by alcohol or you have severe heart failure, drinking alcohol should be stopped completely.  Stop illicit drug use.  Stay up-to-date with immunizations. It is especially important to prevent respiratory infections through current pneumococcal and influenza immunizations.  Manage other health conditions such as hypertension, diabetes, thyroid disease, or abnormal heart rhythms as directed by your caregiver.  Learn to manage stress.  Plan rest periods when fatigued.  Learn strategies to manage high temperatures. If the weather is extremely hot:  Avoid vigorous physical activity.  Use air conditioning or fans or seek a cooler location.  Avoid caffeine and alcohol.  Wear loose-fitting, lightweight, and light-colored clothing.  Learn strategies to manage cold temperatures. If the weather is extremely cold:  Avoid vigorous physical activity.  Layer clothes.  Wear mittens or gloves, a hat, and a scarf when going outside.  Avoid alcohol.  Obtain ongoing education and support as needed.  Participate or seek rehabilitation as needed to maintain or improve independence and quality of life. SEEK MEDICAL CARE IF:   Your weight increases by 03 lb/1.4 kg in 1 day or 05 lb/2.3 kg in a week.  You have increasing shortness of breath that is unusual for you.  You are unable to participate in your usual physical activities.  You tire easily.  You cough more than normal, especially with physical activity.  You have any or more swelling in areas such as your hands, feet, ankles, or abdomen.  You  are unable to sleep because it is hard to breathe.  You feel like your heart is beating fast (palpitations).  You become dizzy or lightheaded upon standing up. SEEK IMMEDIATE MEDICAL CARE IF:   You have difficulty breathing.  There is a change in mental status such as decreased alertness or difficulty with concentration.  You have a pain or discomfort in your chest.  You have an episode of fainting (syncope). MAKE SURE YOU:   Understand these instructions.  Will watch your condition.  Will get help right away if you are not doing well or get worse. Document Released: 08/28/2005 Document Revised: 12/23/2012 Document Reviewed: 09/19/2012 [facility] Patient Information 2014 [place], [place].   [facility] [phone] hhrn and hhpt

## 2013-12-16 NOTE — Discharge Summary (Signed)
[facility]   PCP: Dr. John Giovanni  Primary Cardiologist: Dr. Wyline Mood (former Dr. Daleen Squibb)  EP: Dr [name]  Discharge Summary   Patient ID: [name] MRN: [id number], DOB/AGE: 1965-11-20 48 y.o. Admit date: 12/08/2013 D/C date:     12/16/2013   Primary Discharge Diagnoses:  1) A/C systolic HF: - Diuresed with milrinone and lasix gtt, net negative -11.1 liters Discharge weight 270 lbs - EF 35% with RV mildly dilated (difficult study 11/2013)   Secondary Discharge Diagnoses:  1) NICM - s/p CRT-D [employer] 2) Uncontrolled DM - Hgb A1C 9.9 (12/2013) 3) HTN 4) Morbid Obesity 5) OSA, on CPAP 6) Hypokalemia  Hospital Course: [name] is a 48 yo female with a history of NICM s/p BiV ICD [employer], [employer] buildings), chronic systolic HF, DM2, HTN, morbid obesity and OSA on CPAP.   She presented to [facility] ED on 3/30 with with progressive DOE, orthopnea, and abdominal distention despite taking her diuretics. She was placed on IV lasix and ECHO ordered showing EF 35% and RV mildly dilated. She also had some chest discomfort and CE negative. Lexiscan ordered and showed evidence of ischemia in apex and transferred to [facility].  Her catheterization showed markedly elevated LV filling pressures, low CO and normal coronaries. A PICC was placed to monitor CVPs and Co-ox's and she was started on a lasix gtt at 15 mg/hr along with milrinone 0.25 mg. Her coreg was stopped with acute decompensation along with her hydralazine and nitrates and her Lotensin was cut back. She diuresed briskly and spironolactone was added and her Lotensin was increased which she tolerated. Digoxin was also started d/t class III/IV symptoms. Her milrinone was weaned off slowly while assessing her hemodynamics and following her Cr. She diuresed a net negative of 11.1 liters. On day of discharge she was able to tolerate low dose coreg 3.125 mg BID.  During her stay she had a cardiac rehab consult who  provided education on HF and helped her ambulate. She was ambulating over 600 ft with no issues and was set up for [facility] and PT. She also had a diabetes consult d/t her Hgb A1C being 9.9 and was instructed to follow up with her PCP once discharged.  On day of discharge VSS and she denied any orthopnea, CP, or SOB. Her Cr was stable and was on PO diuretics. She will be followed closely in the HF clinic next week with a BMET and digoxin level. She will be considered for a possible Cardiomems device as an outpatient.   Tests/Procedures: [facility] 12/12/13  RA 38/37 (35)  RV 90/28  PA 95/51 (71)  PCWP 57/73 (54)  LV 127/50  AO 123/66 (100)  Oxygen saturations:  PA 40%  AO 96%  Cardiac Output/Index (Fick) 3.2/1.4, PVR 5.3  Normal coronary anatomy  Discharge Weight Range: 270-273 lbs Discharge Vitals: Blood pressure 93/60, pulse 97, temperature 98.7 F (37.1 C), temperature source Oral, resp. rate 18, height 5\' 5"  (1.651 m), weight 270 lb 15.1 oz (122.9 kg), SpO2 96.00%.  Labs: Lab Results  Component Value Date   WBC 6.1 12/12/2013   HGB 10.9* 12/12/2013   HCT 33.8* 12/12/2013   MCV 89.4 12/12/2013   PLT 210 12/12/2013    Recent Labs Lab 12/10/13 0828  12/16/13 0400  NA 139  < > 141  K 4.0  < > 4.0  CL 92*  < > 95*  CO2 34*  < > 33*  BUN 17  < >  12  CREATININE 0.85  < > 0.87  CALCIUM 8.7  < > 8.4  PROT 9.0*  --   --   BILITOT 0.5  --   --   ALKPHOS 93  --   --   ALT 9  --   --   AST 12  --   --   GLUCOSE 249*  < > 147*  < > = values in this interval not displayed. Lab Results  Component Value Date   CHOL 195 06/21/2012   HDL 45 06/21/2012   LDLCALC 103* 06/21/2012   TRIG 233* 06/21/2012   BNP (last 3 results)  Recent Labs  04/06/13 1549 06/10/13 0954 12/08/13 1028  PROBNP 612.1* 508.1* 948.6*    Diagnostic Studies/Procedures   No results found.  Discharge Medications     Medication List    STOP taking these medications       hydrALAZINE 100 MG tablet  Commonly  known as:  APRESOLINE     isosorbide mononitrate 60 MG 24 hr tablet  Commonly known as:  IMDUR      TAKE these medications       aspirin 81 MG EC tablet  Take 1 tablet (81 mg total) by mouth daily.     benazepril 20 MG tablet  Commonly known as:  LOTENSIN  Take 0.5 tablets (10 mg total) by mouth 2 (two) times daily.     carvedilol 3.125 MG tablet  Commonly known as:  COREG  Take 1 tablet (3.125 mg total) by mouth 2 (two) times daily with a meal.     cetirizine 10 MG tablet  Commonly known as:  ZYRTEC  Take 10 mg by mouth daily as needed for allergies.     cyclobenzaprine 10 MG tablet  Commonly known as:  FLEXERIL  Take 10 mg by mouth 3 (three) times daily as needed. For muscle spasms     digoxin 0.125 MG tablet  Commonly known as:  LANOXIN  Take 1 tablet (0.125 mg total) by mouth daily.     ferrous sulfate 325 (65 FE) MG tablet  Take 325 mg by mouth 3 (three) times daily.     gabapentin 300 MG capsule  Commonly known as:  NEURONTIN  Take two tablets four times a day     HYDROcodone-acetaminophen 10-325 MG per tablet  Commonly known as:  NORCO  Take 1 tablet by mouth every 4 (four) hours as needed for moderate pain.     IMPLANON [place]  Inject 1 each into the skin once. Gets every 3 years     insulin aspart 100 UNIT/ML injection  Commonly known as:  novoLOG  Inject 0-20 Units into the skin 3 (three) times daily with meals.     insulin detemir 100 UNIT/ML injection  Commonly known as:  LEVEMIR  Inject 80 Units into the skin at bedtime.     omeprazole 40 MG capsule  Commonly known as:  PRILOSEC  Take 1 capsule (40 mg total) by mouth daily.     potassium chloride SA 20 MEQ tablet  Commonly known as:  K-DUR,KLOR-CON  Take 2 tablets (40 mEq total) by mouth daily.     RESTORIL PO  Take 20 mg by mouth at bedtime.     STOOL SOFTENER 100 MG capsule  Generic drug:  docusate sodium  Take 100 mg by mouth daily.     torsemide 20 MG tablet  Commonly known as:   DEMADEX  Take 2 tablets (40 mg total) by  mouth 2 (two) times daily.     VENTOLIN HFA 108 (90 BASE) MCG/ACT inhaler  Generic drug:  albuterol  Inhale 2 puffs into the lungs every 6 (six) hours as needed. For shortness of breath     albuterol (5 MG/ML) 0.5% nebulizer solution  Commonly known as:  PROVENTIL  Take 0.5 mLs (2.5 mg total) by nebulization every 4 (four) hours as needed for wheezing or shortness of breath.     vitamin E 400 UNIT capsule  Take 400 Units by mouth daily.        Disposition   The patient will be discharged in stable condition to home. Discharge Orders   Future Appointments Provider Department Dept Phone   12/23/2013 11:00 AM [facility] [phone]   01/01/2014 8:25 AM [facility] [employer] [phone]   Future Orders Complete By Expires   ACE Inhibitor / ARB already ordered  As directed    Amb Referral to Cardiac Rehabilitation  As directed    Beta Blocker already ordered  As directed    Diet - low sodium heart healthy  As directed    Discharge instructions  As directed    Comments:     Bring all medications to follow up appointments. Call any issues [phone]   Heart Failure patients record your daily weight using the same scale at the same time of day  As directed    Increase activity slowly  As directed    PICC line removal  As directed    STOP any activity that causes chest pain, shortness of breath, dizziness, sweating, or exessive weakness  As directed      Follow-up Information   Follow up with [facility]. (RN, [facility] will call to set up an appt.)    Contact information:   [address] [place] [address] [phone]      Follow up with [facility]. (Will call and follow you and your disease process.)       Follow up with [name], MD. Schedule an appointment as soon as possible for a visit in 1 week.   Specialty:   Family Medicine   Contact information:   [address] [place] [address] [phone]       Follow up with [facility] On 12/23/2013. (@ 11: 00 am; Gate code [phone]; Please bring all of your medications and if you have any questions call [phone])    Specialty:  Cardiology   Contact information:   [address] [id number] [place] [address] [phone]        Duration of Discharge Encounter: Greater than 35 minutes   Signed, Aundria Rud  12/16/2013, 6:15 PM

## 2013-12-16 NOTE — Progress Notes (Signed)
Patient ID: [name], female   DOB: Jun 23, 1966, 48 y.o.   MRN: [id number] Advanced Heart Failure Rounding Note  PCP: Dr. John GiovanniStephen Knowlton Primary Cardiologist: Dr. Wyline MoodBranch (former Dr. Daleen SquibbWall) EP: Dr Ladona Ridgelaylor   Subjective:    [name] is a 48 yo female with a history of NICM s/p BiV ICD [facility]([employer]), chronic systolic HF, DM2, HTN, morbid obesity and OSA on CPAP. Had normal cardiac cath in 2006.   She presented to [facility] ED on 3/30 with progressive DOE, orthopnea, and abdominal distention despite taking her diuretics. She was placed on IV lasix and ECHO ordered showing EF 35% and RV mildly dilated. She also had some chest discomfort and CE negative. Lexiscan ordered and showed evidence of ischemia in apex and [facility].  [facility]/RHC 12/12/13 RA 38/37 (35)  RV 90/28  PA 95/51 (71)   PCWP 57/73 (54)  LV 127/50   AO 123/66 (100)  Oxygen saturations:  PA 40%  AO 96% Cardiac Output/Index (Fick) 3.2/1.4, PVR 5.3 Normal coronary anatomy  Reports that she has not been able to walk more than 50 ft for the past few years without SOB. Rides in the buggy around grocery store and not able to go upstairs. +orthopnea.   She was started on milrinone and Lasix gtt Friday. Diuresed well, transition to po torsemide yesterday and milrinone decreased.  Co-ox 60% now with CVP 7-8.  Ambulating the halls without difficulty.   FH: Father alive with HF  SH: No ETOH and does not smoke, [place] in [place] with 48 yr old daughter  Objective:   Weight Range:  Vital Signs:   Temp:  [98.2 F (36.8 C)-98.7 F (37.1 C)] 98.2 F (36.8 C) (04/07 0400) Pulse Rate:  [97] 97 (04/07 0000) Resp:  [18] 18 (04/07 0000) BP: (96-156)/(65-89) 96/65 mmHg (04/07 0400) SpO2:  [95 %-98 %] 96 % (04/07 0400) Weight:  [270 lb 15.1 oz (122.9 kg)] 270 lb 15.1 oz (122.9 kg) (04/07 0557) Last BM Date: 12/14/13  Weight change: Filed Weights   12/14/13 0413 12/15/13 0600 12/16/13 0557  Weight: 281  lb 15.5 oz (127.9 kg) 271 lb 6.2 oz (123.1 kg) 270 lb 15.1 oz (122.9 kg)    Intake/Output:   Intake/Output Summary (Last 24 hours) at 12/16/13 0825 Last data filed at 12/16/13 0600  Gross per 24 hour  Intake 1346.2 ml  Output   2025 ml  Net -678.8 ml     Physical Exam: General:  Sitting in chair. No resp difficulty HEENT: normal Neck: supple. JVP difficult to assess d/t body habitus; Carotids 2+ bilat; no bruits. No lymphadenopathy or thryomegaly appreciated. Cor: Heart sounds distant. Regular rate & rhythm. 2/6 TR + s3 Lungs: clear Abdomen: soft, obese nontender, nondistended. No hepatosplenomegaly. No bruits or masses. Good bowel sounds. Obese Extremities: no cyanosis, clubbing, rash.  No edema.  Neuro: alert & orientedx3, cranial nerves grossly intact. moves all 4 extremities w/o difficulty. Affect pleasant  Telemetry: NSR, BiV paced 95-100  Labs: Basic Metabolic Panel:  Recent Labs Lab 12/12/13 1800 12/13/13 0500 12/14/13 0500 12/15/13 0446 12/16/13 0400  NA 141 141 139 141 141  K 4.1 3.7 3.6* 3.4* 4.0  CL 98 95* 93* 93* 95*  CO2 31 33* 34* 35* 33*  GLUCOSE 124* 140* 168* 127* 147*  BUN 26* 19 13 10 12   CREATININE 0.96 0.82 0.78 0.80 0.87  CALCIUM 8.0* 7.7* 7.7* 8.7 8.4    Liver Function Tests:  Recent Labs Lab  12/10/13 0828  AST 12  ALT 9  ALKPHOS 93  BILITOT 0.5  PROT 9.0*  ALBUMIN 3.5   No results found for this basename: LIPASE, AMYLASE,  in the last 168 hours No results found for this basename: AMMONIA,  in the last 168 hours  CBC:  Recent Labs Lab 12/12/13 0320 12/12/13 1800  WBC 7.0 6.1  HGB 12.2 10.9*  HCT 38.1 33.8*  MCV 90.1 89.4  PLT 290 210    Cardiac Enzymes:  Recent Labs Lab 12/10/13 0828 12/10/13 1347 12/10/13 2002  TROPONINI <0.30 <0.30 <0.30    BNP: BNP (last 3 results)  Recent Labs  04/06/13 1549 06/10/13 0954 12/08/13 1028  PROBNP 612.1* 508.1* 948.6*     Other results:   Imaging: No results  found.   Medications:     Scheduled Medications: . aspirin EC  81 mg Oral Daily  . benazepril  10 mg Oral BID  . digoxin  0.125 mg Oral Daily  . docusate sodium  100 mg Oral Daily  . ferrous sulfate  325 mg Oral TID  . gabapentin  300 mg Oral BID  . heparin  5,000 Units Subcutaneous 3 times per day  . insulin aspart  0-15 Units Subcutaneous TID WC  . insulin aspart  4 Units Subcutaneous TID WC  . insulin detemir  90 Units Subcutaneous QHS  . pantoprazole  40 mg Oral Daily  . potassium chloride SA  40 mEq Oral BID  . sodium chloride  10-40 mL Intracatheter Q12H  . sodium chloride  3 mL Intravenous Q12H  . sodium chloride  3 mL Intravenous Q12H  . spironolactone  25 mg Oral Daily  . torsemide  40 mg Oral BID    Infusions:    PRN Medications: sodium chloride, acetaminophen, acetaminophen, albuterol, cyclobenzaprine, HYDROcodone-acetaminophen, ondansetron (ZOFRAN) IV, ondansetron, oxyCODONE-acetaminophen, sodium chloride, sodium chloride, sodium chloride, temazepam   Assessment/Plan Discussion   1. Acute on chronic systolic CHF: Nonischemic cardiomyopathy. Chronic: ?prior viral myocarditis.  EF 35% by echo but difficult study.  Markedly elevated filling pressures by cardiac catheterization Friday and low cardiac index.  She is now on milrinone 0.125 and po diuretics.  CVP 7-8, co-ox 60%. --Markedly improved. Stop milrinone now.  --Continue po torsemide, benazepril, spironolactone.   --Add digoxin 0.125 mg daily.  --Start back on low dose Coreg 3.125 mg bid (was on 12.5 mg bid at home, will gradually build up). --Follow co-ox --Trick will be figuring out how to keep her dry. Continued discussion about need for fluid restriction and sliding scale diuretic regimen.  --Consider Cardiomems --Not candidate for MRI due to ICD 2. OSA: Bipap at night.  3. Diabetes: Appareciate recs from diabetes service, will follow.  4. Morbid obesity  5. Hypokalemia  6. DM2 7. Disposition:  Recheck in afternoon.  If doing well and ambulating, potential pm discharge.  Home meds: torsemide 40 mg bid, spironolactone 25 daily, benazepril 10 bid, digoxin 0.125 mg daily, Coreg 3.125 mg bid, KCl 40 daily.  Will need followup next Monday or Tuesday with BMET/digoxin in CHF clinic.   Amire Gossen McLeanMD 12/16/2013 8:25 AM

## 2013-12-16 NOTE — Progress Notes (Signed)
CARDIAC REHAB PHASE I   PRE:  Rate/Rhythm: 93 Pacing  BP:  Supine:   Sitting: 127/43  Standing:    SaO2: 97 RA  MODE:  Ambulation: 620 ft   POST:  Rate/Rhythm: 108  BP:  Supine:   Sitting: 124/61  Standing:    SaO2: 97 RA 1030-1115 Pt tolerated ambulation well without c/o. Gait steady. Pt able to walk 620 feet with one standing rest stop. VS stable Pt back to recliner after walk with call light in reach. Completed discharge education with pt and gave her exercise guidelines. She voices understanding.  Melina Copa RN 12/16/2013 11:15 AM

## 2013-12-16 NOTE — Progress Notes (Signed)
Pt. Was placed on CPAP 10cm H2O via FFM (what pt. Wears at home). Pt. Is tolerating CPAP well at this time without any complications.

## 2013-12-23 ENCOUNTER — Encounter (HOSPITAL_COMMUNITY): Payer: Self-pay

## 2013-12-23 ENCOUNTER — Ambulatory Visit (HOSPITAL_COMMUNITY)
Admit: 2013-12-23 | Discharge: 2013-12-23 | Disposition: A | Discharge status: Home / Self Care | Payer: Medicare Other | Source: Ambulatory Visit | Attending: Internal Medicine | Admitting: Internal Medicine

## 2013-12-23 VITALS — BP 104/64 | HR 87 | Resp 18 | Wt 271.1 lb

## 2013-12-23 DIAGNOSIS — I509 Heart failure, unspecified: Secondary | ICD-10-CM

## 2013-12-23 DIAGNOSIS — G4733 Obstructive sleep apnea (adult) (pediatric): Secondary | ICD-10-CM | POA: Insufficient documentation

## 2013-12-23 DIAGNOSIS — I5022 Chronic systolic (congestive) heart failure: Secondary | ICD-10-CM

## 2013-12-23 DIAGNOSIS — Z9989 Dependence on other enabling machines and devices: Secondary | ICD-10-CM

## 2013-12-23 LAB — BASIC METABOLIC PANEL
BUN: 14 mg/dL (ref 6–23)
CO2: 28 mEq/L (ref 19–32)
CREATININE: 0.82 mg/dL (ref 0.50–1.10)
Calcium: 8.6 mg/dL (ref 8.4–10.5)
Chloride: 95 mEq/L — ABNORMAL LOW (ref 96–112)
GFR calc Af Amer: >90 mL/min (ref >90)
GFR calc non Af Amer: 84 mL/min — ABNORMAL LOW (ref >90)
GLUCOSE: 123 mg/dL — AB (ref 70–99)
Potassium: 4.6 mEq/L (ref 3.7–5.3)
Sodium: 138 mEq/L (ref 137–147)

## 2013-12-23 LAB — PRO B NATRIURETIC PEPTIDE: Pro B Natriuretic peptide (BNP): 248.1 pg/mL — ABNORMAL HIGH (ref 0–125)

## 2013-12-23 LAB — DIGOXIN LEVEL: Digoxin Level: 0.6 ng/mL — ABNORMAL LOW (ref 0.8–2.0)

## 2013-12-23 MED ORDER — CARVEDILOL 3.125 MG PO TABS
ORAL_TABLET | ORAL | Status: DC
Start: 2013-12-23 — End: 2014-01-23

## 2013-12-23 NOTE — Progress Notes (Signed)
Patient ID: QUINASIA GUSSLER, female   DOB: 1966-01-30, 48 y.o.   MRN: [id number] Cardiologist: EP: Dr [name] PCP: Dr Metta Clines HPI: Ms. Breyfogle is a 48 yo female with a history of NICM s/p BiV ICD 2007 [facility] [employer]), chronic systolic HF, DM2, HTN, morbid obesity and OSA on CPAP. ECHO 12/09/13 EF 35% echo but difficult study.   Admitted to [facility] 12/08/13 with progressive DOE.and volume overload.  She was transferred from [facility] to [facility] due to acute decompensated HF. ECHO EF 35% by echo but difficult study She was diuresed with IV lasix. Later Milrinone was added due to low output as noted below on RHC wit cardiac index of 1.4 Once euvolemic she transitioned to torsemide 40 mg twice a day. She discharged on low dose carvedilol, benazepril 10 mg twice a day, 25 mg spiro, and digoxin 0.125 mg daily. Due to soft BP hydralazine and Imdur were not restarted.  Discharge weight 270 pounds.   [facility] 12/12/13  RA 38/37 (35)  RV 90/28  PA 95/51 (71)  PCWP 57/73 (54)  LV 127/50  AO 123/66 (100)  Oxygen saturations:  PA 40%  AO 96%  Cardiac Output/Index (Fick) 3.2/1.4, PVR 5.3  Normal coronary anatomy  She returns for post hospital follow up with her husband. Overall she is feeling much better. Able to 1/4 block. Mild dyspnea with exertion. Intermittent dizziness. Weight at home trending up from 264-269 pounds. Drinking 2 liters of fluid per day. Followed by [facility]. Compliant with medications.   Labs 11/28/13 Pro BNP 948  Labs 12/23/13 K 4.6 Creatinine 0.86 Pro BNP 248  FH: Father alive with HF  SH: No ETOH and does not smoke, lives in [place] with husband and 27 yr old daughter    ROS: All systems negative except as listed in HPI, PMH and Problem List.  Past Medical History  Diagnosis Date  . CHF (congestive heart failure)     a. EF 30-35%, RV mildly dilated (difficult study 11/2013) b. RHC (12/2013): RA 38/37 (35), RV 90/28, PA 95/51 (71), PCWP 54, PA 40%, AO 96 %, CO/CI Fick 3.2/1.4, PVR 5.3   .  Nonischemic cardiomyopathy     a. LHC (12/2013) normal coronary anatomy   . Mitral regurgitation     moderate to severe  . S/P cardiac catheterization   . LBBB (left bundle branch block)     s/p [employer] CRT-D  . HTN (hypertension)   . Asthma   . IBS (irritable bowel syndrome)     with primarily constipation  . Morbid obesity   . GERD (gastroesophageal reflux disease)   . IDA (iron deficiency anemia)     2o TO SB AVMS, Hx parenteral iron Dr Mariel Sleet  . AVM (arteriovenous malformation)     Small bowel, s/p duble balloon enteroscopy/APC jejunum Dr Gwinda Passe [facility]) 01/23/2011  . Anemia, chronic disease   . Peripheral neuropathy   . OSA (obstructive sleep apnea)     on CPAP qhs  . GI bleed 06/2009    Hgb 7.5, ferritin 7, transfusion, iron IV  . Dyspnea on exertion     chronic  . Diabetes mellitus without complication     Current Outpatient Prescriptions  Medication Sig Dispense Refill  . albuterol (PROVENTIL) (5 MG/ML) 0.5% nebulizer solution Take 0.5 mLs (2.5 mg total) by nebulization every 4 (four) hours as needed for wheezing or shortness of breath.  20 mL  11  . albuterol (VENTOLIN HFA) 108 (90 BASE) MCG/ACT inhaler Inhale 2 puffs into  the lungs every 6 (six) hours as needed. For shortness of breath      . aspirin EC 81 MG EC tablet Take 1 tablet (81 mg total) by mouth daily.  30 tablet  3  . benazepril (LOTENSIN) 20 MG tablet Take 0.5 tablets (10 mg total) by mouth 2 (two) times daily.  30 tablet  3  . carvedilol (COREG) 3.125 MG tablet Take 1 tablet (3.125 mg total) by mouth 2 (two) times daily with a meal.  60 tablet  3  . cetirizine (ZYRTEC) 10 MG tablet Take 10 mg by mouth daily as needed for allergies.       . cyclobenzaprine (FLEXERIL) 10 MG tablet Take 10 mg by mouth 3 (three) times daily as needed. For muscle spasms      . digoxin (LANOXIN) 0.125 MG tablet Take 1 tablet (0.125 mg total) by mouth daily.  30 tablet  3  . docusate sodium (STOOL SOFTENER) 100 MG  capsule Take 100 mg by mouth daily.       . Etonogestrel (IMPLANON [place]) Inject 1 each into the skin once. Gets every 3 years      . ferrous sulfate 325 (65 FE) MG tablet Take 325 mg by mouth 3 (three) times daily.      Marland Kitchen gabapentin (NEURONTIN) 300 MG capsule Take two tablets four times a day  90 capsule  5  . HYDROcodone-acetaminophen (NORCO) 10-325 MG per tablet Take 1 tablet by mouth every 4 (four) hours as needed for moderate pain.       Marland Kitchen insulin aspart (NOVOLOG) 100 UNIT/ML injection Inject 0-20 Units into the skin 3 (three) times daily with meals.  1 vial  5  . insulin detemir (LEVEMIR) 100 UNIT/ML injection Inject 80 Units into the skin at bedtime.      Marland Kitchen omeprazole (PRILOSEC) 40 MG capsule Take 1 capsule (40 mg total) by mouth daily.  30 capsule  6  . potassium chloride SA (K-DUR,KLOR-CON) 20 MEQ tablet Take 2 tablets (40 mEq total) by mouth daily.  60 tablet  3  . Temazepam (RESTORIL PO) Take 20 mg by mouth at bedtime.      . torsemide (DEMADEX) 20 MG tablet Take 2 tablets (40 mg total) by mouth 2 (two) times daily.  120 tablet  3  . vitamin E 400 UNIT capsule Take 400 Units by mouth daily.       No current facility-administered medications for this encounter.     PHYSICAL EXAM: Filed Vitals:   12/23/13 1043  BP: 104/64  Pulse: 87  Resp: 18  Weight: 271 lb 2 oz (122.981 kg)  SpO2: 97%    General:  Well appearing. No resp difficulty. husband present HEENT: normal Neck: supple. JVP flat. Carotids 2+ bilaterally; no bruits. No lymphadenopathy or thryomegaly appreciated. Cor: PMI normal. Regular rate & rhythm. No rubs, + S3  2/6 TR. Lungs: clear Abdomen: obese, soft, nontender, nondistended. No hepatosplenomegaly. No bruits or masses. Good bowel sounds. Extremities: no cyanosis, clubbing, rash, no lower extremity edema Neuro: alert & orientedx3, cranial nerves grossly intact. Moves all 4 extremities w/o difficulty. Affect pleasant.      ASSESSMENT & PLAN: I reviewed  discharge summary from 12/16/13  1. Chronic Systolic Heart Failure NICM S/P CRT-D [employer]. Discharged from [facility] 4/715. Diuresed with IV lasix and placed on Milrinone for low output heart failure. As she improved Milrionone was weaned off and her previous HF home meds were reduced.  NYHA III. Volume status stable.  Continue torsemide 40 mg twice a day. Instructed to take an additional 20 mg of torsemide for weight 272 pounds or greater.  Continue carvedilol 3.125 mg in am and increase pm dose to 6.25 mg. Continue digoxin 0.125 mg daily. Check dig level  Continue benazepril 10 mg twice a day. In the past she was on hydralazine and imdur but this stopped during recent hospitalization due to hypotension.  Reinforced daily weights, low salt food choices, limiting fluid intake to < 2 liters daily  Check BMET and dig level today. K 4.6 Creatinine 0.82 Pro BNP 248  2. HTN- Stable. Continue current regimen. Watch closely as she was hypotensive in the hospital.  3. OSA - on CPAP. Continue nightly 4. Morbid Obesity - Encouraged to cut back portions.     Follow up in 1 month   [name] D Clegg NP-C  1:00 PM

## 2013-12-23 NOTE — Patient Instructions (Signed)
Follow up in 1 month  Take carvedilol 3.125 mg in am and 6.25 mg at bed time  Take an extra 20 mg torsemide for weight 272 pounds or greater   Do the following things EVERYDAY: 1) Weigh yourself in the morning before breakfast. Write it down and keep it in a log. 2) Take your medicines as prescribed 3) Eat low salt foods-Limit salt (sodium) to 2000 mg per day.  4) Stay as active as you can everyday 5) Limit all fluids for the day to less than 2 liters

## 2014-01-01 ENCOUNTER — Ambulatory Visit (INDEPENDENT_AMBULATORY_CARE_PROVIDER_SITE_OTHER): Payer: Medicare Other | Admitting: *Deleted

## 2014-01-01 ENCOUNTER — Encounter: Payer: Self-pay | Admitting: Internal Medicine

## 2014-01-01 DIAGNOSIS — I509 Heart failure, unspecified: Secondary | ICD-10-CM

## 2014-01-01 DIAGNOSIS — I5023 Acute on chronic systolic (congestive) heart failure: Secondary | ICD-10-CM

## 2014-01-04 LAB — MDC_IDC_ENUM_SESS_TYPE_REMOTE
Brady Statistic RA Percent Paced: 0 %
Brady Statistic RV Percent Paced: 98 %
HighPow Impedance: 52 Ohm
Lead Channel Impedance Value: 1093 Ohm
Lead Channel Impedance Value: 499 Ohm
Lead Channel Impedance Value: 502 Ohm
Lead Channel Setting Pacing Amplitude: 2 V
Lead Channel Setting Pacing Amplitude: 2.2 V
Lead Channel Setting Pacing Amplitude: 2.5 V
Lead Channel Setting Pacing Pulse Width: 0.8 ms
MDC IDC MSMT BATTERY REMAINING LONGEVITY: 77 mo
MDC IDC MSMT LEADCHNL RA SENSING INTR AMPL: 1.3 mV
MDC IDC PG SERIAL: 108094
MDC IDC SET LEADCHNL RV PACING PULSEWIDTH: 0.3 ms
MDC IDC SET ZONE DETECTION INTERVAL: 300 ms
Zone Setting Detection Interval: 250 ms
Zone Setting Detection Interval: 343 ms

## 2014-01-12 NOTE — Progress Notes (Signed)
ICD remote 

## 2014-01-13 ENCOUNTER — Encounter: Payer: Self-pay | Admitting: Cardiology

## 2014-01-22 NOTE — Progress Notes (Signed)
Patient ID: [name], female   DOB: Aug 27, 1966, 48 y.o.   MRN: [id number] Cardiologist: EP: Dr [name] PCP: Dr Metta Clines  HPI: [name] is a 48 yo female with a history of NICM s/p BiV ICD 2007 [facility] [employer]), chronic systolic HF, DM2, HTN, morbid obesity and OSA on CPAP. ECHO 12/09/13 EF 35% echo but difficult study.   Admitted to [facility] 12/08/13 with progressive DOE.and volume overload.  She was transferred from [facility] to [facility] due to acute decompensated HF. ECHO EF 35% by echo but difficult study She was diuresed with IV lasix. Later Milrinone was added due to low output as noted below on RHC wit cardiac index of 1.4 Once euvolemic she transitioned to torsemide 40 mg twice a day. She discharged on low dose carvedilol, benazepril 10 mg twice a day, 25 mg spiro, and digoxin 0.125 mg daily. Due to soft BP hydralazine and Imdur were not restarted.  Discharge weight 270 pounds.   [facility] 12/12/13  RA 38/37 (35)  RV 90/28  PA 95/51 (71)  PCWP 57/73 (54)  LV 127/50  AO 123/66 (100)  Oxygen saturations:  PA 40%  AO 96%  Cardiac Output/Index (Fick) 3.2/1.4, PVR 5.3  Normal coronary anatomy  She returns for follow up. Last visit night time carvedilol was increased. Over all she doing a little better. Does use electric cart in the grocery store.  Able to walk about 1/2 block. Remains SOB with exertion. Uses CPAP nightly. Denies orthopna/PND. Weight at home 269-271 pounds. She has not had additional torsemide. Drinking < 2 liters per day and follows low salt diet. Compliant with medications. [facility] following.   Labs 11/28/13 Pro BNP 948  Labs 12/23/13 Dig level 06, K 4.6,  Creatinine 0.86 , Pro BNP 248  FH: Father alive with HF  SH: No ETOH and does not smoke, lives in [place] with husband and 74 yr old daughter    ROS: All systems negative except as listed in HPI, PMH and Problem List.  Past Medical History  Diagnosis Date  . CHF (congestive heart failure)     a. EF 30-35%, RV mildly dilated  (difficult study 11/2013) b. RHC (12/2013): RA 38/37 (35), RV 90/28, PA 95/51 (71), PCWP 54, PA 40%, AO 96 %, CO/CI Fick 3.2/1.4, PVR 5.3   . Nonischemic cardiomyopathy     a. LHC (12/2013) normal coronary anatomy   . Mitral regurgitation     moderate to severe  . S/P cardiac catheterization   . LBBB (left bundle branch block)     s/p [employer] CRT-D  . HTN (hypertension)   . Asthma   . IBS (irritable bowel syndrome)     with primarily constipation  . Morbid obesity   . GERD (gastroesophageal reflux disease)   . IDA (iron deficiency anemia)     2o TO SB AVMS, Hx parenteral iron Dr Mariel Sleet  . AVM (arteriovenous malformation)     Small bowel, s/p duble balloon enteroscopy/APC jejunum Dr Gwinda Passe [facility]) 01/23/2011  . Anemia, chronic disease   . Peripheral neuropathy   . OSA (obstructive sleep apnea)     on CPAP qhs  . GI bleed 06/2009    Hgb 7.5, ferritin 7, transfusion, iron IV  . Dyspnea on exertion     chronic  . Diabetes mellitus without complication     Current Outpatient Prescriptions  Medication Sig Dispense Refill  . albuterol (PROVENTIL) (5 MG/ML) 0.5% nebulizer solution Take 0.5 mLs (2.5 mg total) by nebulization every 4 (  four) hours as needed for wheezing or shortness of breath.  20 mL  11  . albuterol (VENTOLIN HFA) 108 (90 BASE) MCG/ACT inhaler Inhale 2 puffs into the lungs every 6 (six) hours as needed. For shortness of breath      . aspirin EC 81 MG EC tablet Take 1 tablet (81 mg total) by mouth daily.  30 tablet  3  . benazepril (LOTENSIN) 20 MG tablet Take 0.5 tablets (10 mg total) by mouth 2 (two) times daily.  30 tablet  3  . carvedilol (COREG) 3.125 MG tablet Take 3.125 mg in am and 6.25 mg in pm  90 tablet  3  . cetirizine (ZYRTEC) 10 MG tablet Take 10 mg by mouth daily as needed for allergies.       . cyclobenzaprine (FLEXERIL) 10 MG tablet Take 10 mg by mouth 3 (three) times daily as needed. For muscle spasms      . digoxin (LANOXIN) 0.125 MG tablet  Take 1 tablet (0.125 mg total) by mouth daily.  30 tablet  3  . docusate sodium (STOOL SOFTENER) 100 MG capsule Take 100 mg by mouth daily.       . Etonogestrel (IMPLANON [place]) Inject 1 each into the skin once. Gets every 3 years      . ferrous sulfate 325 (65 FE) MG tablet Take 325 mg by mouth 3 (three) times daily.      Marland Kitchen. gabapentin (NEURONTIN) 300 MG capsule Take two tablets four times a day  90 capsule  5  . HYDROcodone-acetaminophen (NORCO) 10-325 MG per tablet Take 1 tablet by mouth every 4 (four) hours as needed for moderate pain.       Marland Kitchen. insulin aspart (NOVOLOG) 100 UNIT/ML injection Inject 0-20 Units into the skin 3 (three) times daily with meals.  1 vial  5  . insulin detemir (LEVEMIR) 100 UNIT/ML injection Inject 80 Units into the skin at bedtime.      Marland Kitchen. omeprazole (PRILOSEC) 40 MG capsule Take 1 capsule (40 mg total) by mouth daily.  30 capsule  6  . potassium chloride SA (K-DUR,KLOR-CON) 20 MEQ tablet Take 2 tablets (40 mEq total) by mouth daily.  60 tablet  3  . Temazepam (RESTORIL PO) Take 20 mg by mouth at bedtime.      . torsemide (DEMADEX) 20 MG tablet Take 2 tablets (40 mg total) by mouth 2 (two) times daily.  120 tablet  3  . vitamin E 400 UNIT capsule Take 400 Units by mouth daily.       No current facility-administered medications for this encounter.     PHYSICAL EXAM: Filed Vitals:   01/23/14 0812  BP: 112/64  Pulse: 88  Resp: 18  Weight: 269 lb 2 oz (122.074 kg)  SpO2: 94%    General:  Well appearing. No resp difficulty. husband present HEENT: normal Neck: supple. JVP difficult to assess but does not appear elevated.  Carotids 2+ bilaterally; no bruits. No lymphadenopathy or thryomegaly appreciated. Cor: PMI normal. Regular rate & rhythm. No rubs, no S3  2/6 TR. Lungs: clear Abdomen: obese, soft, nontender, nondistended. No hepatosplenomegaly. No bruits or masses. Good bowel sounds. Extremities: no cyanosis, clubbing, rash, no lower extremity edema Neuro:  alert & orientedx3, cranial nerves grossly intact. Moves all 4 extremities w/o difficulty. Affect pleasant.      ASSESSMENT & PLAN: 1. Chronic Systolic Heart Failure NICM S/P CRT-D [employer]. [facility]. Diuresed with IV lasix and placed on Milrinone for  low output heart failure. As she improved Milrionone was weaned off and her previous HF home meds were reduced. Overall she has improved a little but still using electric cart in the grocery store. NYHA III. Volume status stable. Continue torsemide 40 mg twice a day. Instructed to take an additional 20 mg of torsemide for weight 272 pounds or greater.  Increase day time carvedilol to 6.25 mg and continue carvedilol 6.25 mg in the evening.  Continue digoxin 0.125 mg daily. Dig level 0.6  o 12/23/13  Continue benazepril 10 mg twice a day. In the past she was on hydralazine and imdur but this stopped during recent hospitalization due to hypotension.  Reinforced daily weights, low salt food choices, limiting fluid intake to < 2 liters daily  Check ECHO in 3-4 months after HF meds titrated.   2. HTN- Stable. Continue current regimen. Watch closely as she was hypotensive in the hospital.  3. OSA - on CPAP. Continue nightly 4. Morbid Obesity - Encouraged to cut back portions.  5. DM- Per PCP every 3 months     Follow up in 1 month and continue to titrate HF meds.   [name] D Clegg NP-C  8:17 AM

## 2014-01-23 ENCOUNTER — Ambulatory Visit (HOSPITAL_COMMUNITY)
Admission: RE | Admit: 2014-01-23 | Discharge: 2014-01-23 | Disposition: A | Discharge status: Home / Self Care | Payer: Medicare Other | Source: Ambulatory Visit | Attending: Internal Medicine | Admitting: Internal Medicine

## 2014-01-23 ENCOUNTER — Encounter (HOSPITAL_COMMUNITY): Payer: Self-pay

## 2014-01-23 VITALS — BP 112/64 | HR 88 | Resp 18 | Wt 269.1 lb

## 2014-01-23 DIAGNOSIS — I509 Heart failure, unspecified: Secondary | ICD-10-CM | POA: Diagnosis not present

## 2014-01-23 DIAGNOSIS — I5022 Chronic systolic (congestive) heart failure: Secondary | ICD-10-CM | POA: Diagnosis not present

## 2014-01-23 DIAGNOSIS — Z9989 Dependence on other enabling machines and devices: Secondary | ICD-10-CM

## 2014-01-23 DIAGNOSIS — G4733 Obstructive sleep apnea (adult) (pediatric): Secondary | ICD-10-CM | POA: Diagnosis not present

## 2014-01-23 DIAGNOSIS — E119 Type 2 diabetes mellitus without complications: Secondary | ICD-10-CM | POA: Diagnosis present

## 2014-01-23 MED ORDER — CARVEDILOL 6.25 MG PO TABS: 6.2500 mg | ORAL_TABLET | Freq: Two times a day (BID) | ORAL | Status: DC

## 2014-01-23 NOTE — Patient Instructions (Signed)
Follow up in 1 month   Take carvedilol 6.25 mg twice a day   Do the following things EVERYDAY: 1) Weigh yourself in the morning before breakfast. Write it down and keep it in a log. 2) Take your medicines as prescribed 3) Eat low salt foods-Limit salt (sodium) to 2000 mg per day.  4) Stay as active as you can everyday 5) Limit all fluids for the day to less than 2 liters 

## 2014-01-29 NOTE — Progress Notes (Signed)
  Patient Details  Name: [name] MRN: [id number] Date of Birth: 1965-10-29  Today's Date: 01/29/2014 Pt has not been seen since 10/24/2013 will discharge pt  [name] 01/29/2014, 3:43 PM

## 2014-01-29 NOTE — Addendum Note (Signed)
Encounter addended by: Bella Kennedy, PT on: 01/29/2014  3:44 PM<BR>     Documentation filed: Notes Section, Episodes

## 2014-02-12 ENCOUNTER — Encounter (HOSPITAL_COMMUNITY): Payer: Medicare Other

## 2014-02-18 NOTE — Progress Notes (Signed)
Patient ID: [name], female   DOB: June 06, 1966, 48 y.o.   MRN: [id number]  Primary Cardiologist: Dr. [name] EP: Dr [name] PCP: Dr Metta Clines  HPI: [name] is a 48 yo female with a history of NICM s/p BiV ICD 2007 [facility]), chronic systolic HF, DM2, HTN, morbid obesity and OSA on CPAP.   ECHO 12/09/13 EF 35% echo but difficult study.   Admitted to [facility] 12/08/13 with progressive DOE.and volume overload.  She was transferred from [facility] to [facility] due to acute decompensated HF. ECHO EF 35% by echo but difficult study She was diuresed with IV lasix. Later Milrinone was added due to low output as noted below on RHC wit cardiac index of 1.4 Once euvolemic she transitioned to torsemide 40 mg twice a day.  Discharge weight 270 pounds.   [facility] 12/12/13  RA 38/37 (35)  RV 90/28  PA 95/51 (71)  PCWP 57/73 (54)  LV 127/50  AO 123/66 (100)  Oxygen saturations:  PA 40%  AO 96%  Cardiac Output/Index (Fick) 3.2/1.4, PVR 5.3  Normal coronary anatomy  Follow up for Heart Failure: Last visit increased am dose of coreg to 6.25 mg which she tolerated. Overall  Feeling good other than a little sluggish. Reports she can walk about 1/2 block before stopping. Has to use electric cart at the grocery store d/t her breathing and her legs. Denies PND, orthopnea or CP. Wearing CPAP nightly. SOB with moderate exertion. Weight at home 269-272 lbs. Following a low salt diet and drinking less than 2L a day. [facility] no longer following.   Labs 11/28/13 Pro BNP 948  Labs 12/23/13 Dig level 06, K 4.6,  Creatinine 0.86 , Pro BNP 248  FH: Father alive with HF  SH: No ETOH and does not smoke, lives in [place] with husband and 74 yr old daughter    ROS: All systems negative except as listed in HPI, PMH and Problem List.  Past Medical History  Diagnosis Date  . CHF (congestive heart failure)     a. EF 30-35%, RV mildly dilated (difficult study 11/2013) b. RHC (12/2013): RA 38/37 (35), RV 90/28, PA 95/51 (71), PCWP 54, PA  40%, AO 96 %, CO/CI Fick 3.2/1.4, PVR 5.3   . Nonischemic cardiomyopathy     a. LHC (12/2013) normal coronary anatomy   . Mitral regurgitation     moderate to severe  . S/P cardiac catheterization   . LBBB (left bundle branch block)     s/p [employer] CRT-D  . HTN (hypertension)   . Asthma   . IBS (irritable bowel syndrome)     with primarily constipation  . Morbid obesity   . GERD (gastroesophageal reflux disease)   . IDA (iron deficiency anemia)     2o TO SB AVMS, Hx parenteral iron Dr Mariel Sleet  . AVM (arteriovenous malformation)     Small bowel, s/p duble balloon enteroscopy/APC jejunum Dr Gwinda Passe [facility]) 01/23/2011  . Anemia, chronic disease   . Peripheral neuropathy   . OSA (obstructive sleep apnea)     on CPAP qhs  . GI bleed 06/2009    Hgb 7.5, ferritin 7, transfusion, iron IV  . Dyspnea on exertion     chronic  . Diabetes mellitus without complication     Current Outpatient Prescriptions  Medication Sig Dispense Refill  . albuterol (PROVENTIL) (5 MG/ML) 0.5% nebulizer solution Take 0.5 mLs (2.5 mg total) by nebulization every 4 (four) hours as needed for wheezing or shortness of breath.  20 mL  11  . albuterol (VENTOLIN HFA) 108 (90 BASE) MCG/ACT inhaler Inhale 2 puffs into the lungs every 6 (six) hours as needed. For shortness of breath      . aspirin EC 81 MG EC tablet Take 1 tablet (81 mg total) by mouth daily.  30 tablet  3  . benazepril (LOTENSIN) 20 MG tablet Take 0.5 tablets (10 mg total) by mouth 2 (two) times daily.  30 tablet  3  . carvedilol (COREG) 6.25 MG tablet Take 1 tablet (6.25 mg total) by mouth 2 (two) times daily with a meal.  60 tablet  3  . cetirizine (ZYRTEC) 10 MG tablet Take 10 mg by mouth daily as needed for allergies.       . cyclobenzaprine (FLEXERIL) 10 MG tablet Take 10 mg by mouth 3 (three) times daily as needed. For muscle spasms      . digoxin (LANOXIN) 0.125 MG tablet Take 1 tablet (0.125 mg total) by mouth daily.  30 tablet  3   . docusate sodium (STOOL SOFTENER) 100 MG capsule Take 100 mg by mouth daily.       . Etonogestrel (IMPLANON [place]) Inject 1 each into the skin once. Gets every 3 years      . ferrous sulfate 325 (65 FE) MG tablet Take 325 mg by mouth 3 (three) times daily.      Marland Kitchen gabapentin (NEURONTIN) 300 MG capsule Take two tablets four times a day  90 capsule  5  . HYDROcodone-acetaminophen (NORCO) 10-325 MG per tablet Take 1 tablet by mouth every 4 (four) hours as needed for moderate pain.       Marland Kitchen insulin aspart (NOVOLOG) 100 UNIT/ML injection Inject 0-20 Units into the skin 3 (three) times daily with meals.  1 vial  5  . insulin detemir (LEVEMIR) 100 UNIT/ML injection Inject 80 Units into the skin at bedtime.      Marland Kitchen omeprazole (PRILOSEC) 40 MG capsule Take 1 capsule (40 mg total) by mouth daily.  30 capsule  6  . potassium chloride SA (K-DUR,KLOR-CON) 20 MEQ tablet Take 2 tablets (40 mEq total) by mouth daily.  60 tablet  3  . Temazepam (RESTORIL PO) Take 20 mg by mouth at bedtime.      . torsemide (DEMADEX) 20 MG tablet Take 2 tablets (40 mg total) by mouth 2 (two) times daily.  120 tablet  3  . vitamin E 400 UNIT capsule Take 400 Units by mouth daily.       No current facility-administered medications for this encounter.    Filed Vitals:   02/23/14 0808  BP: 129/78  Pulse: 83  Resp: 18  Weight: 271 lb 6 oz (123.095 kg)  SpO2: 95%    PHYSICAL EXAM: General:  Well appearing. No resp difficulty. husband present HEENT: normal Neck: supple. JVP difficult to assess but does not appear elevated.  Carotids 2+ bilaterally; no bruits. No lymphadenopathy or thryomegaly appreciated. Cor: PMI normal. Regular rate & rhythm. No rubs, no S3  2/6 TR. Lungs: clear Abdomen: obese, soft, nontender, nondistended. No hepatosplenomegaly. No bruits or masses. Good bowel sounds. Extremities: no cyanosis, clubbing, rash, no lower extremity edema Neuro: alert & orientedx3, cranial nerves grossly intact. Moves all 4  extremities w/o difficulty. Affect pleasant.   ASSESSMENT & PLAN:  1. Chronic Systolic Heart Failure: NICM, s/p CRT-D [employer]. EF 35% (11/2013) - Patient remains NYHA III/IIIb symptoms. She was admitted to the hospital in early March with low output and  needed milrinone. Her volume status is stable will continue torsemide 40 mg BID. Discussed taking an additional 20 mg torsemide if weight >272 lbs. - Will not increase coreg with continued fatigue, continue 6.25 mg BID. - Continue digoxin and benazepril at current doses. Last dig level 0.6 (12/2013) - Start spiro 12.5 mg daily and will decrease potassium to 20 meq daily. Will check BMET 7-10 days. - Will schedule a CPX test in order to assess cardiac vs pulmonary limitations.  - Reinforced the need and importance of daily weights, a low sodium diet, and fluid restriction (less than 2 L a day). Instructed to call the HF clinic if weight increases more than 3 lbs overnight or 5 lbs in a week.  2. HTN- Stable. As above will start spiro 12.5 mg daily and decrease potassium to 20 meq daily. 3. OSA - on CPAP. Continue nightly 4. Morbid Obesity - Discussed the need to cut back on portion sizes and to try and be as active as possible.  5. DM2- Last Hgb A1C 9.9. She is following up with PCP.   F/U 3 weeks with MD to discuss CPX results.  Ulla Pot[place]grove, Airica Schwartzkopf B NP-C  8:17 AM

## 2014-02-20 ENCOUNTER — Other Ambulatory Visit: Payer: Self-pay | Admitting: Cardiology

## 2014-02-23 ENCOUNTER — Ambulatory Visit (HOSPITAL_COMMUNITY)
Admission: RE | Admit: 2014-02-23 | Discharge: 2014-02-23 | Disposition: A | Discharge status: Home / Self Care | Payer: Medicare Other | Source: Ambulatory Visit | Attending: Internal Medicine | Admitting: Internal Medicine

## 2014-02-23 ENCOUNTER — Encounter (HOSPITAL_COMMUNITY): Payer: Self-pay

## 2014-02-23 VITALS — BP 129/78 | HR 83 | Resp 18 | Wt 271.4 lb

## 2014-02-23 DIAGNOSIS — I059 Rheumatic mitral valve disease, unspecified: Secondary | ICD-10-CM | POA: Diagnosis not present

## 2014-02-23 DIAGNOSIS — E1142 Type 2 diabetes mellitus with diabetic polyneuropathy: Secondary | ICD-10-CM | POA: Insufficient documentation

## 2014-02-23 DIAGNOSIS — I509 Heart failure, unspecified: Secondary | ICD-10-CM | POA: Diagnosis not present

## 2014-02-23 DIAGNOSIS — K219 Gastro-esophageal reflux disease without esophagitis: Secondary | ICD-10-CM | POA: Insufficient documentation

## 2014-02-23 DIAGNOSIS — E119 Type 2 diabetes mellitus without complications: Secondary | ICD-10-CM | POA: Insufficient documentation

## 2014-02-23 DIAGNOSIS — Z09 Encounter for follow-up examination after completed treatment for conditions other than malignant neoplasm: Secondary | ICD-10-CM | POA: Diagnosis not present

## 2014-02-23 DIAGNOSIS — I10 Essential (primary) hypertension: Secondary | ICD-10-CM | POA: Insufficient documentation

## 2014-02-23 DIAGNOSIS — K589 Irritable bowel syndrome without diarrhea: Secondary | ICD-10-CM | POA: Diagnosis not present

## 2014-02-23 DIAGNOSIS — D509 Iron deficiency anemia, unspecified: Secondary | ICD-10-CM | POA: Insufficient documentation

## 2014-02-23 DIAGNOSIS — I5022 Chronic systolic (congestive) heart failure: Secondary | ICD-10-CM | POA: Diagnosis not present

## 2014-02-23 DIAGNOSIS — Z95810 Presence of automatic (implantable) cardiac defibrillator: Secondary | ICD-10-CM | POA: Insufficient documentation

## 2014-02-23 DIAGNOSIS — Z79899 Other long term (current) drug therapy: Secondary | ICD-10-CM | POA: Insufficient documentation

## 2014-02-23 DIAGNOSIS — J45909 Unspecified asthma, uncomplicated: Secondary | ICD-10-CM | POA: Insufficient documentation

## 2014-02-23 DIAGNOSIS — I428 Other cardiomyopathies: Secondary | ICD-10-CM | POA: Insufficient documentation

## 2014-02-23 DIAGNOSIS — Z9989 Dependence on other enabling machines and devices: Secondary | ICD-10-CM | POA: Diagnosis not present

## 2014-02-23 DIAGNOSIS — G4733 Obstructive sleep apnea (adult) (pediatric): Secondary | ICD-10-CM

## 2014-02-23 DIAGNOSIS — Z7982 Long term (current) use of aspirin: Secondary | ICD-10-CM | POA: Diagnosis not present

## 2014-02-23 DIAGNOSIS — Z794 Long term (current) use of insulin: Secondary | ICD-10-CM | POA: Diagnosis not present

## 2014-02-23 MED ORDER — POTASSIUM CHLORIDE CRYS ER 20 MEQ PO TBCR: 20.0000 meq | EXTENDED_RELEASE_TABLET | Freq: Every day | ORAL | Status: DC

## 2014-02-23 MED ORDER — SPIRONOLACTONE 25 MG PO TABS: 12.5000 mg | ORAL_TABLET | Freq: Every day | ORAL | Status: DC

## 2014-02-23 NOTE — Patient Instructions (Signed)
Start spironolactone 12.5 mg (1/2 tablet) daily.  Decrease your potassium to 20 meq (1 tablet) daily.  Will schedule you for a Cardiopulmonary exercise stress test along with labs.  Follow up 3 weeks.  Do the following things EVERYDAY: 1) Weigh yourself in the morning before breakfast. Write it down and keep it in a log. 2) Take your medicines as prescribed 3) Eat low salt foods-Limit salt (sodium) to 2000 mg per day.  4) Stay as active as you can everyday 5) Limit all fluids for the day to less than 2 liters 6)

## 2014-02-26 ENCOUNTER — Encounter (HOSPITAL_COMMUNITY): Payer: Medicare Other

## 2014-02-27 ENCOUNTER — Encounter (HOSPITAL_COMMUNITY)
Admission: RE | Admit: 2014-02-27 | Discharge: 2014-02-27 | Disposition: A | Discharge status: Home / Self Care | Payer: Medicare Other | Source: Ambulatory Visit | Attending: Cardiology | Admitting: Cardiology

## 2014-02-27 ENCOUNTER — Encounter (HOSPITAL_COMMUNITY): Payer: Self-pay

## 2014-02-27 VITALS — BP 100/80 | HR 84 | Ht 65.0 in | Wt 271.0 lb

## 2014-02-27 DIAGNOSIS — Z5189 Encounter for other specified aftercare: Secondary | ICD-10-CM | POA: Insufficient documentation

## 2014-02-27 DIAGNOSIS — I509 Heart failure, unspecified: Secondary | ICD-10-CM

## 2014-02-27 DIAGNOSIS — I5022 Chronic systolic (congestive) heart failure: Secondary | ICD-10-CM | POA: Insufficient documentation

## 2014-02-27 DIAGNOSIS — I428 Other cardiomyopathies: Secondary | ICD-10-CM | POA: Insufficient documentation

## 2014-02-27 NOTE — Progress Notes (Signed)
Patient referred to CR by Dr. Peter [place] due to CHF 428.0. During orientation advised patient on arrival and appointment times what to wear, what to do before, during and after exercise. Reviewed attendance and class policy. Talked about inclement weather and class consultation policy. Pt is scheduled to start Cardiac Rehab on 03/09/14 at 8:15. Pt was advised to come to class 5 minutes before class starts. He was also given instructions on meeting with the dietician and attending the Family Structure classes. Pt is eager to get started. Patient able to do walk test. Discussed home exercise guidelines.

## 2014-02-27 NOTE — Patient Instructions (Signed)
Pt has finished orientation and is scheduled to start CR on 02/27/14 at 8:15. Pt has been instructed to arrive to class 15 minutes early for scheduled class. Pt has been instructed to wear comfortable clothing and shoes with rubber soles. Pt has been told to take their medications 1 hour prior to coming to class.  If the patient is not going to attend class, he/she has been instructed to call.

## 2014-03-05 ENCOUNTER — Ambulatory Visit (HOSPITAL_COMMUNITY)
Admission: RE | Admit: 2014-03-05 | Discharge: 2014-03-05 | Disposition: A | Discharge status: Home / Self Care | Payer: Medicare Other | Source: Ambulatory Visit | Attending: Internal Medicine | Admitting: Internal Medicine

## 2014-03-05 ENCOUNTER — Ambulatory Visit (HOSPITAL_COMMUNITY): Payer: Medicare Other

## 2014-03-05 ENCOUNTER — Telehealth (HOSPITAL_COMMUNITY): Payer: Self-pay

## 2014-03-05 DIAGNOSIS — I5023 Acute on chronic systolic (congestive) heart failure: Secondary | ICD-10-CM

## 2014-03-05 DIAGNOSIS — I509 Heart failure, unspecified: Secondary | ICD-10-CM

## 2014-03-05 DIAGNOSIS — I5033 Acute on chronic diastolic (congestive) heart failure: Secondary | ICD-10-CM | POA: Insufficient documentation

## 2014-03-05 DIAGNOSIS — I5022 Chronic systolic (congestive) heart failure: Secondary | ICD-10-CM

## 2014-03-05 LAB — BASIC METABOLIC PANEL
BUN: 7 mg/dL (ref 6–23)
CO2: 35 mEq/L — ABNORMAL HIGH (ref 19–32)
CREATININE: 0.69 mg/dL (ref 0.50–1.10)
Calcium: 9.1 mg/dL (ref 8.4–10.5)
Chloride: 94 mEq/L — ABNORMAL LOW (ref 96–112)
GFR calc non Af Amer: >90 mL/min (ref >90)
Glucose, Bld: 146 mg/dL — ABNORMAL HIGH (ref 70–99)
POTASSIUM: 3.2 meq/L — AB (ref 3.7–5.3)
Sodium: 141 mEq/L (ref 137–147)

## 2014-03-05 MED ORDER — POTASSIUM CHLORIDE CRYS ER 20 MEQ PO TBCR: 20.0000 meq | EXTENDED_RELEASE_TABLET | Freq: Two times a day (BID) | ORAL | Status: DC

## 2014-03-05 NOTE — Telephone Encounter (Signed)
Lab results reviewed with patient, istructed to increase potassium to 20 meq twice daily.  Lab redraw for next week. Ave Filter

## 2014-03-09 ENCOUNTER — Encounter (HOSPITAL_COMMUNITY)
Admission: RE | Admit: 2014-03-09 | Discharge: 2014-03-09 | Disposition: A | Discharge status: Home / Self Care | Payer: Medicare Other | Source: Ambulatory Visit | Attending: Cardiology | Admitting: Cardiology

## 2014-03-09 DIAGNOSIS — I5022 Chronic systolic (congestive) heart failure: Secondary | ICD-10-CM | POA: Diagnosis not present

## 2014-03-09 DIAGNOSIS — Z5189 Encounter for other specified aftercare: Secondary | ICD-10-CM | POA: Diagnosis present

## 2014-03-09 DIAGNOSIS — I428 Other cardiomyopathies: Secondary | ICD-10-CM | POA: Diagnosis not present

## 2014-03-11 ENCOUNTER — Encounter (HOSPITAL_COMMUNITY)
Admission: RE | Admit: 2014-03-11 | Discharge: 2014-03-11 | Disposition: A | Discharge status: Home / Self Care | Payer: Medicare Other | Source: Ambulatory Visit | Attending: Cardiology | Admitting: Cardiology

## 2014-03-11 DIAGNOSIS — Z5189 Encounter for other specified aftercare: Secondary | ICD-10-CM | POA: Diagnosis present

## 2014-03-11 DIAGNOSIS — I428 Other cardiomyopathies: Secondary | ICD-10-CM | POA: Insufficient documentation

## 2014-03-11 DIAGNOSIS — I5022 Chronic systolic (congestive) heart failure: Secondary | ICD-10-CM | POA: Diagnosis not present

## 2014-03-13 ENCOUNTER — Encounter (HOSPITAL_COMMUNITY): Payer: Medicare Other

## 2014-03-16 ENCOUNTER — Encounter (HOSPITAL_COMMUNITY): Payer: Medicare Other

## 2014-03-16 ENCOUNTER — Encounter (HOSPITAL_COMMUNITY)
Admission: RE | Admit: 2014-03-16 | Discharge: 2014-03-16 | Disposition: A | Discharge status: Home / Self Care | Payer: Medicare Other | Source: Ambulatory Visit | Attending: Cardiology | Admitting: Cardiology

## 2014-03-16 DIAGNOSIS — Z5189 Encounter for other specified aftercare: Secondary | ICD-10-CM | POA: Diagnosis not present

## 2014-03-18 ENCOUNTER — Encounter (HOSPITAL_COMMUNITY): Payer: Medicare Other

## 2014-03-20 ENCOUNTER — Encounter (HOSPITAL_COMMUNITY): Payer: Medicare Other

## 2014-03-23 ENCOUNTER — Encounter (HOSPITAL_COMMUNITY): Payer: Medicare Other

## 2014-03-25 ENCOUNTER — Encounter (HOSPITAL_COMMUNITY): Payer: Medicare Other

## 2014-03-27 ENCOUNTER — Encounter (HOSPITAL_COMMUNITY)
Admission: RE | Admit: 2014-03-27 | Discharge: 2014-03-27 | Disposition: A | Discharge status: Home / Self Care | Payer: Medicare Other | Source: Ambulatory Visit | Attending: Cardiology | Admitting: Cardiology

## 2014-03-27 DIAGNOSIS — Z5189 Encounter for other specified aftercare: Secondary | ICD-10-CM | POA: Diagnosis not present

## 2014-03-30 ENCOUNTER — Encounter (HOSPITAL_COMMUNITY)
Admission: RE | Admit: 2014-03-30 | Discharge: 2014-03-30 | Disposition: A | Discharge status: Home / Self Care | Payer: Medicare Other | Source: Ambulatory Visit | Attending: Cardiology | Admitting: Cardiology

## 2014-03-30 DIAGNOSIS — Z5189 Encounter for other specified aftercare: Secondary | ICD-10-CM | POA: Diagnosis not present

## 2014-03-31 ENCOUNTER — Other Ambulatory Visit (HOSPITAL_COMMUNITY): Payer: Self-pay | Admitting: Neurosurgery

## 2014-03-31 DIAGNOSIS — M542 Cervicalgia: Secondary | ICD-10-CM

## 2014-03-31 DIAGNOSIS — M7989 Other specified soft tissue disorders: Secondary | ICD-10-CM

## 2014-04-01 ENCOUNTER — Encounter (HOSPITAL_COMMUNITY)
Admission: RE | Admit: 2014-04-01 | Discharge: 2014-04-01 | Disposition: A | Discharge status: Home / Self Care | Payer: Medicare Other | Source: Ambulatory Visit | Attending: Cardiology | Admitting: Cardiology

## 2014-04-01 ENCOUNTER — Other Ambulatory Visit: Payer: Self-pay | Admitting: *Deleted

## 2014-04-01 DIAGNOSIS — Z5189 Encounter for other specified aftercare: Secondary | ICD-10-CM | POA: Diagnosis not present

## 2014-04-01 DIAGNOSIS — M792 Neuralgia and neuritis, unspecified: Secondary | ICD-10-CM

## 2014-04-01 MED ORDER — GABAPENTIN 300 MG PO CAPS: ORAL_CAPSULE | ORAL | Status: DC

## 2014-04-02 ENCOUNTER — Other Ambulatory Visit (HOSPITAL_COMMUNITY): Payer: Self-pay | Admitting: Neurosurgery

## 2014-04-02 DIAGNOSIS — M7989 Other specified soft tissue disorders: Secondary | ICD-10-CM

## 2014-04-03 ENCOUNTER — Encounter (HOSPITAL_COMMUNITY)
Admission: RE | Admit: 2014-04-03 | Discharge: 2014-04-03 | Disposition: A | Discharge status: Home / Self Care | Payer: Medicare Other | Source: Ambulatory Visit | Attending: Cardiology | Admitting: Cardiology

## 2014-04-03 DIAGNOSIS — Z5189 Encounter for other specified aftercare: Secondary | ICD-10-CM | POA: Diagnosis not present

## 2014-04-06 ENCOUNTER — Encounter (HOSPITAL_COMMUNITY)
Admission: RE | Admit: 2014-04-06 | Discharge: 2014-04-06 | Disposition: A | Discharge status: Home / Self Care | Payer: Medicare Other | Source: Ambulatory Visit | Attending: Cardiology | Admitting: Cardiology

## 2014-04-06 DIAGNOSIS — Z5189 Encounter for other specified aftercare: Secondary | ICD-10-CM | POA: Diagnosis not present

## 2014-04-08 ENCOUNTER — Encounter (HOSPITAL_COMMUNITY)
Admission: RE | Admit: 2014-04-08 | Discharge: 2014-04-08 | Disposition: A | Discharge status: Home / Self Care | Payer: Medicare Other | Source: Ambulatory Visit | Attending: Cardiology | Admitting: Cardiology

## 2014-04-08 ENCOUNTER — Encounter (HOSPITAL_COMMUNITY): Payer: Self-pay | Admitting: Pharmacy Technician

## 2014-04-08 DIAGNOSIS — Z5189 Encounter for other specified aftercare: Secondary | ICD-10-CM | POA: Diagnosis not present

## 2014-04-09 ENCOUNTER — Other Ambulatory Visit (HOSPITAL_COMMUNITY): Payer: Self-pay

## 2014-04-09 MED ORDER — DIGOXIN 125 MCG PO TABS: 0.1250 mg | ORAL_TABLET | Freq: Every day | ORAL | Status: DC

## 2014-04-10 ENCOUNTER — Encounter (HOSPITAL_COMMUNITY)
Admission: RE | Admit: 2014-04-10 | Discharge: 2014-04-10 | Disposition: A | Discharge status: Home / Self Care | Payer: Medicare Other | Source: Ambulatory Visit | Attending: Cardiology | Admitting: Cardiology

## 2014-04-10 DIAGNOSIS — Z5189 Encounter for other specified aftercare: Secondary | ICD-10-CM | POA: Diagnosis not present

## 2014-04-13 ENCOUNTER — Encounter (HOSPITAL_COMMUNITY): Payer: Medicare Other

## 2014-04-13 ENCOUNTER — Ambulatory Visit (HOSPITAL_COMMUNITY)
Admission: RE | Admit: 2014-04-13 | Discharge: 2014-04-13 | Disposition: A | Discharge status: Home / Self Care | Payer: Medicare Other | Source: Ambulatory Visit | Attending: Neurosurgery | Admitting: Neurosurgery

## 2014-04-13 ENCOUNTER — Encounter (HOSPITAL_COMMUNITY): Payer: Self-pay

## 2014-04-13 DIAGNOSIS — M7989 Other specified soft tissue disorders: Secondary | ICD-10-CM

## 2014-04-13 DIAGNOSIS — R209 Unspecified disturbances of skin sensation: Secondary | ICD-10-CM | POA: Diagnosis present

## 2014-04-13 DIAGNOSIS — M47812 Spondylosis without myelopathy or radiculopathy, cervical region: Secondary | ICD-10-CM | POA: Diagnosis not present

## 2014-04-13 LAB — GLUCOSE, CAPILLARY: Glucose-Capillary: 149 mg/dL — ABNORMAL HIGH (ref 70–99)

## 2014-04-13 MED ORDER — OXYCODONE-ACETAMINOPHEN 5-325 MG PO TABS
ORAL_TABLET | ORAL | Status: DC
Start: 2014-04-13 — End: 2014-04-14
  Filled 2014-04-13: qty 1

## 2014-04-13 MED ORDER — DIAZEPAM 5 MG PO TABS
10.0000 mg | ORAL_TABLET | Freq: Once | ORAL | Status: AC
  Administered 2014-04-13: 10 mg via ORAL
  Filled 2014-04-13: qty 2

## 2014-04-13 MED ORDER — ONDANSETRON HCL 4 MG/2ML IJ SOLN: 4.0000 mg | Freq: Four times a day (QID) | INTRAMUSCULAR | Status: DC | PRN

## 2014-04-13 MED ORDER — OXYCODONE-ACETAMINOPHEN 5-325 MG PO TABS
ORAL_TABLET | ORAL | Status: AC
  Administered 2014-04-13: 1 via ORAL
  Filled 2014-04-13: qty 1

## 2014-04-13 MED ORDER — DIAZEPAM 5 MG PO TABS
ORAL_TABLET | ORAL | Status: AC
  Administered 2014-04-13: 10 mg via OROMUCOSAL
  Filled 2014-04-13: qty 2

## 2014-04-13 MED ORDER — OXYCODONE-ACETAMINOPHEN 5-325 MG PO TABS
1.0000 | ORAL_TABLET | ORAL | Status: DC | PRN
  Administered 2014-04-13 (×2): 1 via ORAL

## 2014-04-13 MED ORDER — IOHEXOL 300 MG/ML  SOLN
10.0000 mL | Freq: Once | INTRAMUSCULAR | Status: AC | PRN
  Administered 2014-04-13: 10 mL via INTRATHECAL

## 2014-04-13 NOTE — Op Note (Signed)
*   No surgery found * cervical Myelogram  PATIENT:  [name] is a 48 y.o. female  PRE-OPERATIVE DIAGNOSIS:  cervicalgia  POST-OPERATIVE DIAGNOSIS:  cervicalgia  PROCEDURE:  Cervical Myelogram  SURGEON:  Ciro Tashiro  ANESTHESIA:   local LOCAL MEDICATIONS USED:  LIDOCAINE  Procedure Note: [name] is a 48 y.o. female Was taken to the fluoroscopy suite and  positioned prone on the fluoroscopy table. Her back was prepared and draped in a sterile manner. I infiltrated 6 cc into the lumbar region. I then introduced a spinal needle into the thecal sac at the L3/4 interlaminar space. I infiltrated 10cc of Omnipaque 300 into the thecal sac. Fluoroscopy showed the needle and contrast in the thecal sac. [name] tolerated the procedure well. she Will be taken to CT for evaluation.     PATIENT DISPOSITION:  PACU - hemodynamically stable.

## 2014-04-13 NOTE — H&P (Signed)
BP 111/66  Pulse 83  Temp(Src) 98.7 F (37.1 C) (Oral)  Resp 16  Ht 5\' 5"  (1.651 m)  Wt 120.657 kg (266 lb)  BMI 44.26 kg/m2  SpO2 93%  LMP 06/21/2012 HISTORY OF PRESENT ILLNESS:                     [name] is a 48 year old woman, who is currently disabled, right-handed, and presents for evaluation of swelling in the right upper extremity along with numbness and pain.  She said she awoke, her hand was swollen, it would go numb with sharp shooting pain.  She was seen by her family physician, referred on then to Dr. Romeo Apple an orthopedic surgeon.  He felt that after injection, there is some other treatment in therapy that the problem was coming from her neck.  She received the second opinion from another orthopedic surgeon, who concurred that the neck was the reason she was having the pain and discomfort in the upper extremity.     REVIEW OF SYSTEMS:                                    Positive for hypertension, swelling in the upper extremity, leg pain with walking, asthma, shortness of breath, arm weakness, leg weakness, back pain, arm pain, leg pain, joint pain, neck pain, diabetes, anemia, and bronchitis.  She denies constitutional, eye, ear, nose, throat, mouth, gastrointestinal, genitourinary, skin, neurological, psychiatric, or allergic problems.  On the pain chart, [name] lists pain in the left upper extremity proximally to the elbow coming from the posterior cervical region and pain anteriorly and posteriorly in the right lower extremity.  She has sharper pain now and more spasms in the left upper extremity than she had.  She uses cold and heat for it.  She states the medication and those modalities will calm it too.     PAST MEDICAL HISTORY:              Current Medical Conditions:  Significant for hypertension, gastroesophageal reflux, diabetes, and asthma.  [name] has had some headaches.              Prior Operations:  She underwent her defibrillator placement in  May of 2007.              Medications and Allergies:  No known drug allergies.  Medications:  Carvedilol, cetirizine, cyclobenzaprine, digoxin, gabapentin, hydrocodone, iron, omeprazole, Pro-Air, and torsemide.     FAMILY HISTORY:                                            Mother is deceased secondary to cancer.  Father 48 is in good health and has heart disease and uses a pacemaker.  Hypertension, diabetes, heart problems, and cancer present in the family history.     SOCIAL HISTORY:                                            She does not smoke.  She does not use alcohol.  She does not use illicit drugs.     PHYSICAL EXAMINATION:  She is 65 inches in height and weighs 267 pounds.  BMI is 44.43.  Blood pressure is 117/76, pulse is 93, and temperature is 97.6 Fahrenheit.  Pain is rated at 4/10 today.  Examination reveals he is alert and oriented x4 and answering all questions appropriately.  Memory, language, attention span and fund of knowledge are normal.  Speech is clear and fluent.  Symmetric facial sensation and movement.  Hearing intact to voice bilaterally.  Uvula elevates in midline.  Shoulder shrug is normal.  Tongue protrudes in the midline.  5/5 strength in right upper extremity and both lower extremities.  Some slight weakness in the left grip when compared to the right and slight weakness in the triceps and slight weakness in the biceps on the left side.  She has normal muscle, tone, bulk, and coordination.  Romberg is negative.  Gait is otherwise normal.  Reflex is not elicited at the knees and in her ankles.  2+ biceps, triceps, brachioradialis bilaterally.  She has no clonus.  Proprioception is intact in the upper and lower extremities.     DIAGNOSTIC STUDIES:                                    Plain x-rays were reviewed.  They showed some mild degeneration at C6-7.  Otherwise, his spine looks very good.  Might be some mild foraminal narrowing on the  left side at C6-7.     DIAGNOSIS:                                                     Left upper extremity swelling and pain.     IMPRESSION/PLAN:                                         I'm not at all sure that this has anything whatsoever to do for radicular problem.  It does not cause edema in order to cause the type of symptoms that she describes typically.  But she does have the pain, she said it does radiate in the arm, not so much into the forearm.  This only was  to evaluate this due to the defibrillator,  is for her to undergo myelogram and postmyelogram CT.  At the very least is important for ruling it out because then other modalities can be used to try to figure out what is happened.  This has been going on for close to a year and she has had treatment conservative an invasive for that time without any diagnosis.

## 2014-04-13 NOTE — Discharge Instructions (Signed)
Myelography, Care After °These instructions give you information on caring for yourself after your procedure. Your doctor may also give you specific instructions. Call your doctor if you have any problems or questions after your procedure. °HOME CARE °· Rest often the first day. °· When you rest, lie flat, with your head slightly raised (elevated). °· Avoid heavy lifting and activity for 48 hours. °· You may take the bandage (dressing) off 1 day after the test. °GET HELP RIGHT AWAY IF:  °· You have a very bad headache. °· You have a fever. °MAKE SURE YOU: °· Understand these instructions. °· Will watch your condition. °· Will get help right away if you are not doing well or get worse. °Document Released: 06/06/2008 Document Revised: 01/12/2014 Document Reviewed: 12/17/2013 °[employer]® Patient Information ©2015 [employer]. This information is not intended to replace advice given to you by your health care provider. Make sure you discuss any questions you have with your health care provider. ° ° °

## 2014-04-13 NOTE — Progress Notes (Signed)
Pt discharge instruction given per MD order.  Pt and CG able to verbalize understanding.  Pt to car via wheelchair.

## 2014-04-15 ENCOUNTER — Encounter (HOSPITAL_COMMUNITY): Payer: Medicare Other

## 2014-04-17 ENCOUNTER — Encounter (HOSPITAL_COMMUNITY): Payer: Medicare Other

## 2014-04-20 ENCOUNTER — Other Ambulatory Visit (HOSPITAL_COMMUNITY): Payer: Self-pay | Admitting: Cardiology

## 2014-04-20 ENCOUNTER — Encounter (HOSPITAL_COMMUNITY)
Admission: RE | Admit: 2014-04-20 | Discharge: 2014-04-20 | Disposition: A | Discharge status: Home / Self Care | Payer: Medicare Other | Source: Ambulatory Visit | Attending: Cardiology | Admitting: Cardiology

## 2014-04-20 DIAGNOSIS — I428 Other cardiomyopathies: Secondary | ICD-10-CM | POA: Insufficient documentation

## 2014-04-20 DIAGNOSIS — Z5189 Encounter for other specified aftercare: Secondary | ICD-10-CM | POA: Diagnosis present

## 2014-04-20 DIAGNOSIS — I5022 Chronic systolic (congestive) heart failure: Secondary | ICD-10-CM | POA: Insufficient documentation

## 2014-04-20 DIAGNOSIS — I509 Heart failure, unspecified: Secondary | ICD-10-CM

## 2014-04-20 MED ORDER — BENAZEPRIL HCL 20 MG PO TABS: 10.0000 mg | ORAL_TABLET | Freq: Two times a day (BID) | ORAL | Status: DC

## 2014-04-22 ENCOUNTER — Encounter (HOSPITAL_COMMUNITY)
Admission: RE | Admit: 2014-04-22 | Discharge: 2014-04-22 | Disposition: A | Discharge status: Home / Self Care | Payer: Medicare Other | Source: Ambulatory Visit | Attending: Cardiology | Admitting: Cardiology

## 2014-04-22 DIAGNOSIS — Z5189 Encounter for other specified aftercare: Secondary | ICD-10-CM | POA: Diagnosis not present

## 2014-04-23 ENCOUNTER — Encounter (HOSPITAL_COMMUNITY): Payer: Self-pay

## 2014-04-23 ENCOUNTER — Ambulatory Visit (HOSPITAL_COMMUNITY)
Admission: RE | Admit: 2014-04-23 | Discharge: 2014-04-23 | Disposition: A | Discharge status: Home / Self Care | Payer: Medicare Other | Source: Ambulatory Visit | Attending: Internal Medicine | Admitting: Internal Medicine

## 2014-04-23 VITALS — BP 92/64 | HR 95 | Wt 272.8 lb

## 2014-04-23 DIAGNOSIS — I447 Left bundle-branch block, unspecified: Secondary | ICD-10-CM | POA: Insufficient documentation

## 2014-04-23 DIAGNOSIS — G4733 Obstructive sleep apnea (adult) (pediatric): Secondary | ICD-10-CM | POA: Diagnosis not present

## 2014-04-23 DIAGNOSIS — E1142 Type 2 diabetes mellitus with diabetic polyneuropathy: Secondary | ICD-10-CM | POA: Diagnosis not present

## 2014-04-23 DIAGNOSIS — E1149 Type 2 diabetes mellitus with other diabetic neurological complication: Secondary | ICD-10-CM | POA: Insufficient documentation

## 2014-04-23 DIAGNOSIS — I10 Essential (primary) hypertension: Secondary | ICD-10-CM | POA: Diagnosis not present

## 2014-04-23 DIAGNOSIS — K589 Irritable bowel syndrome without diarrhea: Secondary | ICD-10-CM | POA: Diagnosis not present

## 2014-04-23 DIAGNOSIS — D509 Iron deficiency anemia, unspecified: Secondary | ICD-10-CM | POA: Insufficient documentation

## 2014-04-23 DIAGNOSIS — J45909 Unspecified asthma, uncomplicated: Secondary | ICD-10-CM | POA: Insufficient documentation

## 2014-04-23 DIAGNOSIS — Z7982 Long term (current) use of aspirin: Secondary | ICD-10-CM | POA: Diagnosis not present

## 2014-04-23 DIAGNOSIS — K219 Gastro-esophageal reflux disease without esophagitis: Secondary | ICD-10-CM | POA: Diagnosis not present

## 2014-04-23 DIAGNOSIS — I509 Heart failure, unspecified: Secondary | ICD-10-CM | POA: Insufficient documentation

## 2014-04-23 DIAGNOSIS — I5022 Chronic systolic (congestive) heart failure: Secondary | ICD-10-CM

## 2014-04-23 DIAGNOSIS — I059 Rheumatic mitral valve disease, unspecified: Secondary | ICD-10-CM | POA: Diagnosis not present

## 2014-04-23 DIAGNOSIS — Z794 Long term (current) use of insulin: Secondary | ICD-10-CM | POA: Insufficient documentation

## 2014-04-23 DIAGNOSIS — I428 Other cardiomyopathies: Secondary | ICD-10-CM | POA: Insufficient documentation

## 2014-04-23 DIAGNOSIS — I5023 Acute on chronic systolic (congestive) heart failure: Secondary | ICD-10-CM | POA: Diagnosis not present

## 2014-04-23 LAB — BASIC METABOLIC PANEL
ANION GAP: 19 — AB (ref 5–15)
BUN: 9 mg/dL (ref 6–23)
CALCIUM: 8.8 mg/dL (ref 8.4–10.5)
CO2: 24 mEq/L (ref 19–32)
CREATININE: 0.69 mg/dL (ref 0.50–1.10)
Chloride: 93 mEq/L — ABNORMAL LOW (ref 96–112)
GFR calc Af Amer: >90 mL/min (ref >90)
Glucose, Bld: 148 mg/dL — ABNORMAL HIGH (ref 70–99)
Potassium: 4.3 mEq/L (ref 3.7–5.3)
Sodium: 136 mEq/L — ABNORMAL LOW (ref 137–147)

## 2014-04-23 MED ORDER — SPIRONOLACTONE 25 MG PO TABS: 25.0000 mg | ORAL_TABLET | Freq: Every day | ORAL | Status: DC

## 2014-04-23 NOTE — Progress Notes (Signed)
Patient ID: [name], female   DOB: 1966/02/12, 48 y.o.   MRN: [id number]  Primary Cardiologist: Dr. Ladona Ridgelaylor EP: Dr Ladona Ridgelaylor PCP: Dr Metta ClinesNolton  HPI: [name] is a 48 yo female with a history of NICM s/p BiV ICD 2007 [facility]([employer]), chronic systolic HF, DM2, HTN, morbid obesity and OSA on CPAP.   ECHO 12/09/13 EF 35% echo but difficult study.   [facility] with progressive DOE.and volume overload.  [facility] to acute decompensated HF. ECHO EF 35% by echo but difficult study She was diuresed with IV lasix. Later Milrinone was added due to low output as noted below on RHC wit cardiac index of 1.4 Once euvolemic she transitioned to torsemide 40 mg twice a day.  Discharge weight 270 pounds.   [facility] 12/12/13  RA 38/37 (35)  RV 90/28  PA 95/51 (71)  PCWP 57/73 (54)  LV 127/50  AO 123/66 (100)  Oxygen saturations:  PA 40%  AO 96%  Cardiac Output/Index (Fick) 3.2/1.4, PVR 5.3  Normal coronary anatomy  CPX 6/15 FVC 1.34 (42%)  FEV1 1.11 (43%)  FEV1/FVC 83%  MVV 42 (40%) RPE: 19 Resting HR: 80 Peak HR: 115 (67% age predicted max HR) BP rest: 102/70 BP peak: 118/68 Peak VO2: 9.3 (52.9% predicted peak VO2) VE/VCO2 slope: 29.3 OUES: 1.57 Peak RER: 0.95 Ventilatory Threshold: 7.7 (43.8% predicted peak VO2) Peak RR 44 Peak Ventilation: 32 VE/MVV: 76.2% PETCO2 at peak: 41 O2pulse: 10 (77% predicted O2pulse)  Follow up for Heart Failure: Continues to struggle with severe DOE. Hard to do ADLs. At last visit spiro added. CPX ordered which showed severe limitation mostly due to obesity and restrictive lung physiology. Mild cardiac limitation.  Going to cardiac rehab using UBE and Nu-Step. Unable to do TM due to back spasms and SOB.  Denies PND, orthopnea or CP. Edema well controlled. Old CPAP machine not working well trying to get new one.  Weight at home 269-272 lbs. Following a low salt diet and drinking less than 2L a day. AHC no longer  following.   Labs 11/28/13 Pro BNP 948  Labs 12/23/13 Dig level 06, K 4.6,  Creatinine 0.86 , Pro BNP 248 Labs 02/23/14 K 3.2 Cr 0.69  FH: Father alive with HF  SH: No ETOH and does not smoke, [place] in [place] with husband and 48 yr old daughter    ROS: All systems negative except as listed in HPI, PMH and Problem List.  Past Medical History  Diagnosis Date  . CHF (congestive heart failure)     a. EF 30-35%, RV mildly dilated (difficult study 11/2013) b. RHC (12/2013): RA 38/37 (35), RV 90/28, PA 95/51 (71), PCWP 54, PA 40%, AO 96 %, CO/CI Fick 3.2/1.4, PVR 5.3   . Nonischemic cardiomyopathy     a. LHC (12/2013) normal coronary anatomy   . Mitral regurgitation     moderate to severe  . S/P cardiac catheterization   . LBBB (left bundle branch block)     s/p [employer] CRT-D  . HTN (hypertension)   . Asthma   . IBS (irritable bowel syndrome)     with primarily constipation  . Morbid obesity   . GERD (gastroesophageal reflux disease)   . IDA (iron deficiency anemia)     2o TO SB AVMS, Hx parenteral iron Dr Mariel SleetNeijstrom  . AVM (arteriovenous malformation)     Small bowel, s/p duble balloon enteroscopy/[facility]junum [facility] 01/23/2011  .  Anemia, chronic disease   . Peripheral neuropathy   . OSA (obstructive sleep apnea)     on CPAP qhs  . GI bleed 06/2009    Hgb 7.5, ferritin 7, transfusion, iron IV  . Dyspnea on exertion     chronic  . Diabetes mellitus without complication     Current Outpatient Prescriptions  Medication Sig Dispense Refill  . albuterol (PROVENTIL HFA;VENTOLIN HFA) 108 (90 BASE) MCG/ACT inhaler Inhale 2 puffs into the lungs every 6 (six) hours as needed for wheezing or shortness of breath.      Marland Kitchen albuterol (PROVENTIL) (5 MG/ML) 0.5% nebulizer solution Take 0.5 mLs (2.5 mg total) by nebulization every 4 (four) hours as needed for wheezing or shortness of breath.  20 mL  11  . aspirin EC 81 MG EC tablet Take 1 tablet (81 mg total) by mouth daily.   30 tablet  3  . benazepril (LOTENSIN) 20 MG tablet Take 0.5 tablets (10 mg total) by mouth 2 (two) times daily.  30 tablet  3  . carvedilol (COREG) 6.25 MG tablet Take 1 tablet (6.25 mg total) by mouth 2 (two) times daily with a meal.  60 tablet  3  . cetirizine (ZYRTEC) 10 MG tablet Take 10 mg by mouth daily as needed for allergies.       . cyclobenzaprine (FLEXERIL) 10 MG tablet Take 10 mg by mouth 3 (three) times daily as needed for muscle spasms.       . digoxin (LANOXIN) 0.125 MG tablet Take 1 tablet (0.125 mg total) by mouth daily.  30 tablet  3  . docusate sodium (STOOL SOFTENER) 100 MG capsule Take 100 mg by mouth daily.       . ferrous sulfate 325 (65 FE) MG tablet Take 325 mg by mouth 3 (three) times daily.      Marland Kitchen gabapentin (NEURONTIN) 300 MG capsule Take 600 mg by mouth 4 (four) times daily.      Marland Kitchen gabapentin (NEURONTIN) 400 MG capsule Take 800 mg by mouth 4 (four) times daily.      Marland Kitchen HYDROcodone-acetaminophen (NORCO) 10-325 MG per tablet Take 1 tablet by mouth every 4 (four) hours as needed for moderate pain.       Marland Kitchen insulin aspart (NOVOLOG) 100 UNIT/ML injection Inject 0-20 Units into the skin 3 (three) times daily with meals.  1 vial  5  . insulin detemir (LEVEMIR) 100 UNIT/ML injection Inject 80 Units into the skin at bedtime.      Marland Kitchen omeprazole (PRILOSEC) 40 MG capsule Take 1 capsule (40 mg total) by mouth daily.  30 capsule  6  . potassium chloride SA (K-DUR,KLOR-CON) 20 MEQ tablet Take 1 tablet (20 mEq total) by mouth 2 (two) times daily.  30 tablet  3  . spironolactone (ALDACTONE) 25 MG tablet Take 0.5 tablets (12.5 mg total) by mouth daily.  15 tablet  3  . temazepam (RESTORIL) 30 MG capsule Take 30 mg by mouth at bedtime.      . torsemide (DEMADEX) 20 MG tablet Take 2 tablets (40 mg total) by mouth 2 (two) times daily.  120 tablet  3  . vitamin E 400 UNIT capsule Take 400 Units by mouth daily.       No current facility-administered medications for this encounter.    Filed  Vitals:   04/23/14 1415  BP: 92/64  Pulse: 95  Weight: 272 lb 12.8 oz (123.741 kg)  SpO2: 95%    PHYSICAL EXAM: General: Obese woman.  No resp difficulty. husband present HEENT: normal Neck: supple. JVP difficult to assess but does not appear elevated.  Carotids 2+ bilaterally; no bruits. No lymphadenopathy or thryomegaly appreciated. Cor: PMI normal. Regular rate & rhythm. No rubs, no S3  2/6 TR. Lungs: clear Abdomen: obese, soft, nontender, nondistended. No hepatosplenomegaly. No bruits or masses. Good bowel sounds. Extremities: no cyanosis, clubbing, rash, no lower extremity edema Neuro: alert & orientedx3, cranial nerves grossly intact. Moves all 4 extremities w/o difficulty. Affect pleasant.   ASSESSMENT & PLAN:  1. Chronic Systolic Heart Failure: NICM, s/p CRT-D [employer]. EF 35% (11/2013) - Patient remains NYHA III/IIIb symptoms. She was admitted to the hospital in early March with low output and needed milrinone. Her volume status is stable will continue torsemide 40 mg BID. Discussed taking an additional 20 mg torsemide if weight >272 lbs. - CPX reviewed in detail. Has mild cardiac limitation but main issue is severe ventilatory limitation due to obesity and restrictive physiology - Will not increase coreg with continued fatigue, continue 6.25 mg BID. - Continue digoxin and benazepril at current doses. Last dig level 0.6 (12/2013) - Increase spiro to 25 mg daily. Check BMET today and 1 week. - Can consider Corlanor at some point - Stressed absolut need for weight loss as life and death issue for her.  Discussed bariatric surgery (not interested), weigh watchers or low-carb diet.  - Reinforced the need and importance of daily weights, a low sodium diet, and fluid restriction (less than 2 L a day). Instructed to call the HF clinic if weight increases more than 3 lbs overnight or 5 lbs in a week.  2. HTN- Stable. As above will start spiro 12.5 mg daily and decrease potassium  to 20 meq daily. 3. OSA - Continues to have problem with her CPAP. Suggested following with pulmonary 4. Morbid Obesity - as above 5. DM2- Last Hgb A1C 9.9. She is following up with PCP.   Total time spent 40 minutes. Over half that time spent discussing above.   [name] MD 2:40 PM

## 2014-04-23 NOTE — Patient Instructions (Signed)
Increase Spironolactone to 25 mg daily  Labs today and in 1 week  Your physician recommends that you schedule a follow-up appointment in: 2 months

## 2014-04-24 ENCOUNTER — Encounter (HOSPITAL_COMMUNITY): Payer: Medicare Other

## 2014-04-24 ENCOUNTER — Encounter: Payer: Self-pay | Admitting: *Deleted

## 2014-04-27 ENCOUNTER — Encounter (HOSPITAL_COMMUNITY)
Admission: RE | Admit: 2014-04-27 | Discharge: 2014-04-27 | Disposition: A | Discharge status: Home / Self Care | Payer: Medicare Other | Source: Ambulatory Visit | Attending: Cardiology | Admitting: Cardiology

## 2014-04-27 DIAGNOSIS — Z5189 Encounter for other specified aftercare: Secondary | ICD-10-CM | POA: Diagnosis not present

## 2014-04-29 ENCOUNTER — Encounter (HOSPITAL_COMMUNITY)
Admission: RE | Admit: 2014-04-29 | Discharge: 2014-04-29 | Disposition: A | Discharge status: Home / Self Care | Payer: Medicare Other | Source: Ambulatory Visit | Attending: Cardiology | Admitting: Cardiology

## 2014-04-29 DIAGNOSIS — Z5189 Encounter for other specified aftercare: Secondary | ICD-10-CM | POA: Diagnosis not present

## 2014-05-01 ENCOUNTER — Encounter (HOSPITAL_COMMUNITY)
Admission: RE | Admit: 2014-05-01 | Discharge: 2014-05-01 | Disposition: A | Discharge status: Home / Self Care | Payer: Medicare Other | Source: Ambulatory Visit | Attending: Cardiology | Admitting: Cardiology

## 2014-05-01 ENCOUNTER — Other Ambulatory Visit (HOSPITAL_COMMUNITY): Payer: Self-pay

## 2014-05-01 DIAGNOSIS — Z5189 Encounter for other specified aftercare: Secondary | ICD-10-CM | POA: Diagnosis not present

## 2014-05-01 MED ORDER — TORSEMIDE 20 MG PO TABS: 40.0000 mg | ORAL_TABLET | Freq: Two times a day (BID) | ORAL | Status: DC

## 2014-05-04 ENCOUNTER — Encounter (HOSPITAL_COMMUNITY): Payer: Medicare Other

## 2014-05-05 ENCOUNTER — Telehealth (HOSPITAL_COMMUNITY): Payer: Self-pay | Admitting: Anesthesiology

## 2014-05-05 ENCOUNTER — Encounter (HOSPITAL_COMMUNITY): Payer: Self-pay | Admitting: Anesthesiology

## 2014-05-05 ENCOUNTER — Ambulatory Visit (HOSPITAL_COMMUNITY)
Admission: RE | Admit: 2014-05-05 | Discharge: 2014-05-05 | Disposition: A | Discharge status: Home / Self Care | Payer: Medicare Other | Source: Ambulatory Visit | Attending: Cardiology | Admitting: Cardiology

## 2014-05-05 ENCOUNTER — Other Ambulatory Visit (HOSPITAL_COMMUNITY): Payer: Self-pay | Admitting: Anesthesiology

## 2014-05-05 DIAGNOSIS — M7989 Other specified soft tissue disorders: Secondary | ICD-10-CM

## 2014-05-05 DIAGNOSIS — M79609 Pain in unspecified limb: Secondary | ICD-10-CM

## 2014-05-05 DIAGNOSIS — I5022 Chronic systolic (congestive) heart failure: Secondary | ICD-10-CM

## 2014-05-05 LAB — BASIC METABOLIC PANEL
ANION GAP: 13 (ref 5–15)
BUN: 11 mg/dL (ref 6–23)
CALCIUM: 8.9 mg/dL (ref 8.4–10.5)
CO2: 31 mEq/L (ref 19–32)
CREATININE: 0.78 mg/dL (ref 0.50–1.10)
Chloride: 95 mEq/L — ABNORMAL LOW (ref 96–112)
Glucose, Bld: 165 mg/dL — ABNORMAL HIGH (ref 70–99)
Potassium: 3.7 mEq/L (ref 3.7–5.3)
Sodium: 139 mEq/L (ref 137–147)

## 2014-05-05 MED ORDER — DIGOXIN 125 MCG PO TABS: 0.1250 mg | ORAL_TABLET | Freq: Every day | ORAL | Status: DC

## 2014-05-05 MED ORDER — POTASSIUM CHLORIDE CRYS ER 20 MEQ PO TBCR: 20.0000 meq | EXTENDED_RELEASE_TABLET | Freq: Two times a day (BID) | ORAL | Status: DC

## 2014-05-05 NOTE — Addendum Note (Signed)
Encounter addended by: Ave Filter, RN on: 05/05/2014  8:40 AM<BR>     Documentation filed: Orders

## 2014-05-05 NOTE — Progress Notes (Signed)
VASCULAR LAB PRELIMINARY  PRELIMINARY  PRELIMINARY  PRELIMINARY  Left lower extremity venous duplex completed.    Preliminary report:  Left:  No evidence of DVT, superficial thrombosis, or Baker's cyst.  Antonae Zbikowski, RVS 05/05/2014, 11:28 AM

## 2014-05-05 NOTE — Telephone Encounter (Signed)
Patient needs refills on medications. Refilled potassium 20 meq BID and digoxin 0.125 mg daily. Patient also concerned she may have blood clot in L leg. Very painful and feels the same way that it felt when she had a blood clot before. Will order venous doppler.   Ulla Potash B 8:29 AM

## 2014-05-06 ENCOUNTER — Encounter (HOSPITAL_COMMUNITY): Payer: Medicare Other

## 2014-05-08 ENCOUNTER — Encounter (HOSPITAL_COMMUNITY): Payer: Medicare Other

## 2014-05-11 ENCOUNTER — Encounter (HOSPITAL_COMMUNITY): Payer: Medicare Other

## 2014-05-13 ENCOUNTER — Encounter (HOSPITAL_COMMUNITY): Payer: Medicare Other

## 2014-05-15 ENCOUNTER — Encounter (HOSPITAL_COMMUNITY): Payer: Medicare Other

## 2014-05-18 ENCOUNTER — Encounter (HOSPITAL_COMMUNITY): Payer: Medicare Other

## 2014-05-20 ENCOUNTER — Encounter (HOSPITAL_COMMUNITY): Payer: Medicare Other

## 2014-05-22 ENCOUNTER — Encounter (HOSPITAL_COMMUNITY): Payer: Medicare Other

## 2014-05-25 ENCOUNTER — Encounter (HOSPITAL_COMMUNITY): Payer: Medicare Other

## 2014-05-27 ENCOUNTER — Encounter (HOSPITAL_COMMUNITY): Payer: Medicare Other

## 2014-05-29 ENCOUNTER — Encounter (HOSPITAL_COMMUNITY): Payer: Medicare Other

## 2014-06-15 ENCOUNTER — Other Ambulatory Visit (HOSPITAL_COMMUNITY): Payer: Self-pay | Admitting: Cardiology

## 2014-06-15 MED ORDER — CARVEDILOL 6.25 MG PO TABS: 6.2500 mg | ORAL_TABLET | Freq: Two times a day (BID) | ORAL | Status: DC

## 2014-06-16 ENCOUNTER — Encounter: Payer: Self-pay | Admitting: *Deleted

## 2014-06-23 ENCOUNTER — Ambulatory Visit (HOSPITAL_COMMUNITY)
Admission: RE | Admit: 2014-06-23 | Discharge: 2014-06-23 | Disposition: A | Discharge status: Home / Self Care | Payer: Medicare Other | Source: Ambulatory Visit | Attending: Internal Medicine | Admitting: Internal Medicine

## 2014-06-23 VITALS — BP 108/54 | HR 93 | Wt 269.0 lb

## 2014-06-23 DIAGNOSIS — G4733 Obstructive sleep apnea (adult) (pediatric): Secondary | ICD-10-CM | POA: Insufficient documentation

## 2014-06-23 DIAGNOSIS — E119 Type 2 diabetes mellitus without complications: Secondary | ICD-10-CM | POA: Insufficient documentation

## 2014-06-23 DIAGNOSIS — Z79899 Other long term (current) drug therapy: Secondary | ICD-10-CM | POA: Insufficient documentation

## 2014-06-23 DIAGNOSIS — I10 Essential (primary) hypertension: Secondary | ICD-10-CM | POA: Insufficient documentation

## 2014-06-23 DIAGNOSIS — I5022 Chronic systolic (congestive) heart failure: Secondary | ICD-10-CM | POA: Insufficient documentation

## 2014-06-23 DIAGNOSIS — Z7982 Long term (current) use of aspirin: Secondary | ICD-10-CM | POA: Diagnosis not present

## 2014-06-23 DIAGNOSIS — Z794 Long term (current) use of insulin: Secondary | ICD-10-CM | POA: Diagnosis not present

## 2014-06-23 DIAGNOSIS — K219 Gastro-esophageal reflux disease without esophagitis: Secondary | ICD-10-CM | POA: Diagnosis not present

## 2014-06-23 DIAGNOSIS — I5023 Acute on chronic systolic (congestive) heart failure: Secondary | ICD-10-CM

## 2014-06-23 LAB — BASIC METABOLIC PANEL
ANION GAP: 12 (ref 5–15)
BUN: 8 mg/dL (ref 6–23)
CALCIUM: 8.6 mg/dL (ref 8.4–10.5)
CO2: 34 mEq/L — ABNORMAL HIGH (ref 19–32)
CREATININE: 0.77 mg/dL (ref 0.50–1.10)
Chloride: 92 mEq/L — ABNORMAL LOW (ref 96–112)
Glucose, Bld: 168 mg/dL — ABNORMAL HIGH (ref 70–99)
Potassium: 3.3 mEq/L — ABNORMAL LOW (ref 3.7–5.3)
Sodium: 138 mEq/L (ref 137–147)

## 2014-06-23 LAB — DIGOXIN LEVEL: Digoxin Level: 0.5 ng/mL — ABNORMAL LOW (ref 0.8–2.0)

## 2014-06-23 MED ORDER — TORSEMIDE 20 MG PO TABS: ORAL_TABLET | ORAL | Status: DC

## 2014-06-23 MED ORDER — LOSARTAN POTASSIUM 50 MG PO TABS: 50.0000 mg | ORAL_TABLET | Freq: Every day | ORAL | Status: DC

## 2014-06-23 NOTE — Patient Instructions (Signed)
Increase Torsemide to 60 mg (3 tabs) in AM and 40 mg (2 tabs) in PM  Stop Benazepril  Start Losartan 50 mg Twice daily   Start Corlanor 2.5 mg Twice daily, we will contact you once your insurance has approved medication  Labs today  Lab in 2 weeks at [facility]  Your physician recommends that you schedule a follow-up appointment in: 3-4 weeks

## 2014-06-23 NOTE — Progress Notes (Signed)
Patient ID: GENORA ARP, female   DOB: [birth year], 48 y.o.   MRN: [id number] Primary Cardiologist: Dr. [name] EP: Dr [name] PCP: Dr [name]  HPI: Ms. Kjos is a 48 yo female with a history of NICM s/p BiV ICD 2007 [facility]), chronic systolic HF, DM2, HTN, morbid obesity and OSA on CPAP.   ECHO 12/09/13 EF 35% echo but difficult study.   Admitted to [facility] 12/08/13 with progressive DOE.and volume overload.  She was transferred from [facility] to [facility] due to acute decompensated HF. ECHO EF 35% by echo but difficult study She was diuresed with IV lasix. Later Milrinone was added due to low output as noted below on RHC wit cardiac index of 1.4 Once euvolemic she transitioned to torsemide 40 mg twice a day.  Discharge weight 270 pounds.   [facility] 12/12/13  RA 38/37 (35)  RV 90/28  PA 95/51 (71)  PCWP 57/73 (54)  LV 127/50  AO 123/66 (100)  Oxygen saturations:  PA 40%  AO 96%  Cardiac Output/Index (Fick) 3.2/1.4, PVR 5.3  Normal coronary anatomy  CPX 6/15 FVC 1.34 (42%)  FEV1 1.11 (43%)  FEV1/FVC 83%  MVV 42 (40%) RPE: 19 Resting HR: 80 Peak HR: 115 (67% age predicted max HR) BP rest: 102/70 BP peak: 118/68 Peak VO2: 9.3 (52.9% predicted peak VO2) VE/VCO2 slope: 29.3 OUES: 1.57 Peak RER: 0.95 Ventilatory Threshold: 7.7 (43.8% predicted peak VO2) Peak RR 44 Peak Ventilation: 32 VE/MVV: 76.2% PETCO2 at peak: 41 O2pulse: 10 (77% predicted O2pulse)  Patient continues to have significant exertional dyspnea.  She is short of breath walking from her house to the car.  She is short of breath with housework.  She chronically sleeps on 2 pillows.  No orthopnea.  She has a dry cough predominantly in the mornings.  Weight is down 3 lbs.   Labs 11/28/13 Pro BNP 948  Labs 12/23/13 Dig level 0.6, K 4.6,  Creatinine 0.86 , Pro BNP 248 Labs 02/23/14 K 3.2 Cr 0.69 Labs 8/15 K 3.7, creatinine 0.78  FH: Father alive with HF   SH: No ETOH and does not smoke, lives in [place] with husband and 89  yr old daughter   ROS: All systems negative except as listed in HPI, PMH and Problem List.  Past Medical History  Diagnosis Date  . CHF (congestive heart failure)     a. EF 30-35%, RV mildly dilated (difficult study 11/2013) b. RHC (12/2013): RA 38/37 (35), RV 90/28, PA 95/51 (71), PCWP 54, PA 40%, AO 96 %, CO/CI Fick 3.2/1.4, PVR 5.3   . Nonischemic cardiomyopathy     a. LHC (12/2013) normal coronary anatomy   . Mitral regurgitation     moderate to severe  . S/P cardiac catheterization   . LBBB (left bundle branch block)     s/p [employer] CRT-D  . HTN (hypertension)   . Asthma   . IBS (irritable bowel syndrome)     with primarily constipation  . Morbid obesity   . GERD (gastroesophageal reflux disease)   . IDA (iron deficiency anemia)     2o TO SB AVMS, Hx parenteral iron Dr Mariel Sleet  . AVM (arteriovenous malformation)     Small bowel, s/p duble balloon enteroscopy/APC jejunum Dr Gwinda [facility]) 01/23/2011  . Anemia, chronic disease   . Peripheral neuropathy   . OSA (obstructive sleep apnea)     on CPAP qhs  . GI bleed 06/2009    Hgb 7.5, ferritin 7, transfusion, iron IV  .  Dyspnea on exertion     chronic  . Diabetes mellitus without complication     Current Outpatient Prescriptions  Medication Sig Dispense Refill  . albuterol (PROVENTIL HFA;VENTOLIN HFA) 108 (90 BASE) MCG/ACT inhaler Inhale 2 puffs into the lungs every 6 (six) hours as needed for wheezing or shortness of breath.      Marland Kitchen albuterol (PROVENTIL) (5 MG/ML) 0.5% nebulizer solution Take 0.5 mLs (2.5 mg total) by nebulization every 4 (four) hours as needed for wheezing or shortness of breath.  20 mL  11  . aspirin EC 81 MG EC tablet Take 1 tablet (81 mg total) by mouth daily.  30 tablet  3  . carvedilol (COREG) 6.25 MG tablet Take 1 tablet (6.25 mg total) by mouth 2 (two) times daily with a meal.  60 tablet  3  . cetirizine (ZYRTEC) 10 MG tablet Take 10 mg by mouth daily as needed for allergies.       .  cyclobenzaprine (FLEXERIL) 10 MG tablet Take 10 mg by mouth 3 (three) times daily as needed for muscle spasms.       . digoxin (LANOXIN) 0.125 MG tablet Take 1 tablet (0.125 mg total) by mouth daily.  90 tablet  3  . docusate sodium (STOOL SOFTENER) 100 MG capsule Take 100 mg by mouth daily.       . ferrous sulfate 325 (65 FE) MG tablet Take 325 mg by mouth 3 (three) times daily.      Marland Kitchen gabapentin (NEURONTIN) 300 MG capsule Take 600 mg by mouth 4 (four) times daily.      Marland Kitchen HYDROcodone-acetaminophen (NORCO) 10-325 MG per tablet Take 1 tablet by mouth every 4 (four) hours as needed for moderate pain.       Marland Kitchen insulin aspart (NOVOLOG) 100 UNIT/ML injection Inject 0-20 Units into the skin 3 (three) times daily with meals.  1 vial  5  . insulin detemir (LEVEMIR) 100 UNIT/ML injection Inject 80 Units into the skin at bedtime.      Marland Kitchen omeprazole (PRILOSEC) 40 MG capsule Take 1 capsule (40 mg total) by mouth daily.  30 capsule  6  . potassium chloride SA (K-DUR,KLOR-CON) 20 MEQ tablet Take 1 tablet (20 mEq total) by mouth 2 (two) times daily. Or as instructed when to take extra.  190 tablet  3  . spironolactone (ALDACTONE) 25 MG tablet Take 1 tablet (25 mg total) by mouth daily.  30 tablet  3  . temazepam (RESTORIL) 30 MG capsule Take 30 mg by mouth at bedtime.      . torsemide (DEMADEX) 20 MG tablet Take 3 tabs in the AM (60 mg) and take 2 tabs in the PM (40 mg)  150 tablet  3  . vitamin E 400 UNIT capsule Take 400 Units by mouth daily.      Marland Kitchen losartan (COZAAR) 50 MG tablet Take 1 tablet (50 mg total) by mouth daily.  90 tablet  3   No current facility-administered medications for this encounter.    Filed Vitals:   06/23/14 0909  BP: 108/54  Pulse: 93  Weight: 269 lb (122.018 kg)  SpO2: 95%    PHYSICAL EXAM: General: Obese woman. No resp difficulty. husband present HEENT: normal Neck: Thick. JVP 8-9 cm.  Carotids 2+ bilaterally; no bruits. No lymphadenopathy or thryomegaly appreciated. Cor: PMI  normal. Regular rate & rhythm. No rubs, +S3  2/6 TR. Lungs: clear Abdomen: obese, soft, nontender, nondistended. No hepatosplenomegaly. No bruits or masses. Good bowel sounds.  Extremities: no cyanosis, clubbing, rash.  1+ ankle edema.  Neuro: alert & orientedx3, cranial nerves grossly intact. Moves all 4 extremities w/o difficulty. Affect pleasant.  ASSESSMENT & PLAN:  1. Chronic Systolic Heart Failure: Nonischemic cardiomyopathy, s/p [employer] CRT-D. EF 35% (11/2013).  Patient remains NYHA III/IIIb. She was admitted to the hospital in early March with low output and needed milrinone. CPX in 6/15 showed mild cardiac limitation but main issue seemed to be severe ventilatory limitation due to obesity and restrictive physiology - Given mild volume overload, will increase torsemide to 60 qam, 40 qpm.  BMET today and in 2 wks.  - Will not increase coreg with continued fatigue, continue 6.25 mg BID.  Instead will start ivabridine 2.5 mg bid given HR in 90s.   - Continue digoxin, check level today.  - Continue spironolactone.  - Cough may be due to benazepril.  Stop benazepril and start losartan 50 mg bid.  - Reinforced the need and importance of daily weights, a low sodium diet, and fluid restriction (less than 2 L a day). Instructed to call the HF clinic if weight increases more than 3 lbs overnight or 5 lbs in a week.  2. HTN: Stable.  3. OSA: Continues to have problem with her CPAP. Suggested following with pulmonary 4. Morbid Obesity: She is going to consider bariatric surgery.  I will give [facility] to the [facility].    Marca Anconaalton Jemmie Rhinehart MD 06/23/2014

## 2014-06-24 ENCOUNTER — Other Ambulatory Visit: Payer: Self-pay | Admitting: *Deleted

## 2014-06-24 ENCOUNTER — Encounter: Payer: Self-pay | Admitting: Vascular Surgery

## 2014-06-24 DIAGNOSIS — M7989 Other specified soft tissue disorders: Secondary | ICD-10-CM

## 2014-06-29 ENCOUNTER — Encounter: Payer: Self-pay | Admitting: Internal Medicine

## 2014-06-29 ENCOUNTER — Ambulatory Visit (INDEPENDENT_AMBULATORY_CARE_PROVIDER_SITE_OTHER): Payer: Medicare Other | Admitting: *Deleted

## 2014-06-29 DIAGNOSIS — I429 Cardiomyopathy, unspecified: Secondary | ICD-10-CM

## 2014-06-29 NOTE — Progress Notes (Signed)
Remote ICD transmission.   

## 2014-07-03 ENCOUNTER — Telehealth (HOSPITAL_COMMUNITY): Payer: Self-pay | Admitting: *Deleted

## 2014-07-03 MED ORDER — IVABRADINE HCL 5 MG PO TABS: 5.0000 mg | ORAL_TABLET | Freq: Two times a day (BID) | ORAL | Status: DC

## 2014-07-03 NOTE — Progress Notes (Signed)
Cardiac Rehabilitation Program Outcomes Report   Orientation:  [birth year] Graduate Date:  tbd Discharge Date:  tbd # of sessions completed: 3  Cardiologist: Branch Family MD:  Ellene Route Time:  0815  A.  Exercise Program:  Tolerates exercise @ 3.76 METS for 15 minutes and Walk Test Results:  Pre: 2.2 mets  B.  Mental Health:  Good mental attitude  C.  Education/Instruction/Skills  Accurately checks own pulse.  Rest:  80  Exercise:  102  Uses Perceived Exertion Scale and/or Dyspnea Scale  D.  Nutrition/Weight Control/Body Composition:  Adherence to prescribed nutrition program: good    E.  Blood Lipids    Lab Results  Component Value Date   CHOL 195 06/21/2012   HDL 45 06/21/2012   LDLCALC 103* 06/21/2012   TRIG 233* 06/21/2012   CHOLHDL 4.3 06/21/2012    F.  Lifestyle Changes:  Making positive lifestyle changes and Not smoking:  Quit 2011  G.  Symptoms noted with exercise:  Back spasms on session 2 with TM  Report Completed By:  Enid Derry RN   Comments:  First week progress note.

## 2014-07-03 NOTE — Addendum Note (Signed)
Encounter addended by: Andrey Cota, RN on: 07/03/2014  3:06 PM<BR>     Documentation filed: Notes Section

## 2014-07-03 NOTE — Telephone Encounter (Signed)
Pt's Corlanor 5 mg Twice daily was approved through [employer], approval # [id number], pt aware and rx sent to pharmacy

## 2014-07-09 ENCOUNTER — Encounter: Payer: Self-pay | Admitting: Vascular Surgery

## 2014-07-10 ENCOUNTER — Ambulatory Visit (HOSPITAL_COMMUNITY)
Admission: RE | Admit: 2014-07-10 | Discharge: 2014-07-10 | Disposition: A | Discharge status: Home / Self Care | Payer: Medicare Other | Source: Ambulatory Visit | Attending: Vascular Surgery | Admitting: Vascular Surgery

## 2014-07-10 ENCOUNTER — Other Ambulatory Visit: Payer: Self-pay | Admitting: Vascular Surgery

## 2014-07-10 ENCOUNTER — Encounter: Payer: Self-pay | Admitting: Vascular Surgery

## 2014-07-10 ENCOUNTER — Ambulatory Visit (INDEPENDENT_AMBULATORY_CARE_PROVIDER_SITE_OTHER): Payer: Medicare Other | Admitting: Vascular Surgery

## 2014-07-10 VITALS — BP 115/75 | HR 82 | Ht 65.0 in | Wt 262.4 lb

## 2014-07-10 DIAGNOSIS — R609 Edema, unspecified: Secondary | ICD-10-CM | POA: Insufficient documentation

## 2014-07-10 DIAGNOSIS — M7989 Other specified soft tissue disorders: Secondary | ICD-10-CM

## 2014-07-10 DIAGNOSIS — R600 Localized edema: Secondary | ICD-10-CM

## 2014-07-10 LAB — MDC_IDC_ENUM_SESS_TYPE_REMOTE
Brady Statistic RV Percent Paced: 98 %
HighPow Impedance: 53 Ohm
Implantable Pulse Generator Serial Number: 108094
Lead Channel Impedance Value: 1164 Ohm
Lead Channel Impedance Value: 508 Ohm
Lead Channel Sensing Intrinsic Amplitude: 1.2 mV
Lead Channel Sensing Intrinsic Amplitude: 21.1 mV
Lead Channel Sensing Intrinsic Amplitude: 23 mV
Lead Channel Setting Pacing Amplitude: 2.2 V
Lead Channel Setting Pacing Amplitude: 2.5 V
Lead Channel Setting Pacing Pulse Width: 0.3 ms
MDC IDC MSMT LEADCHNL RA IMPEDANCE VALUE: 494 Ohm
MDC IDC SET LEADCHNL LV PACING PULSEWIDTH: 0.8 ms
MDC IDC SET LEADCHNL RA PACING AMPLITUDE: 2 V
MDC IDC SET ZONE DETECTION INTERVAL: 342.9 ms
MDC IDC STAT BRADY RA PERCENT PACED: 0 %
Zone Setting Detection Interval: 250 ms
Zone Setting Detection Interval: 300 ms

## 2014-07-10 NOTE — Progress Notes (Signed)
Referred by:  Barbara Obey, MD [address] [place], [place] [address]  Reason for referral: Swollen bilateral legs  History of Present Illness  [name] is a 48 y.o. ([birth year]) female who presents with chief complaint: B swollen legs.  Patient notes, onset of swelling in May associated with drug changes in April.  The patient's symptoms include: swelling and heaviness and aching pain intermittently in each leg.  The patient has had no history of DVT, history of pregnancy, no history of varicose vein, no history of venous stasis ulcers, no history of  Lymphedema and no history of skin changes in lower legs.  There is no family history of venous disorders.  The patient has never used compression stockings in the past.  Pt notes prior admission to hospital with CHF.  Past Medical History  Diagnosis Date  . CHF (congestive heart failure)     a. EF 30-35%, RV mildly dilated (difficult study 11/2013) b. RHC (12/2013): RA 38/37 (35), RV 90/28, PA 95/51 (71), PCWP 54, PA 40%, AO 96 %, CO/CI Fick 3.2/1.4, PVR 5.3   . Nonischemic cardiomyopathy     a. LHC (12/2013) normal coronary anatomy   . Mitral regurgitation     moderate to severe  . S/P cardiac catheterization   . LBBB (left bundle branch block)     s/p [employer] CRT-D  . HTN (hypertension)   . Asthma   . IBS (irritable bowel syndrome)     with primarily constipation  . Morbid obesity   . GERD (gastroesophageal reflux disease)   . IDA (iron deficiency anemia)     2o TO SB AVMS, Hx parenteral iron Dr Mariel Sleet  . AVM (arteriovenous malformation)     Small bowel, s/p duble balloon enteroscopy/APC jejunum Dr Gwinda Passe [facility]) 01/23/2011  . Anemia, chronic disease   . Peripheral neuropathy   . OSA (obstructive sleep apnea)     on CPAP qhs  . GI bleed 06/2009    Hgb 7.5, ferritin 7, transfusion, iron IV  . Dyspnea on exertion     chronic  . Diabetes mellitus without complication     Past Surgical History    Procedure Laterality Date  . Crdt-implantation  4/07    [employer]. remote- yes  . Appendectomy    . Cholecystectomy      biliary dyskinesia  . Mastectomy  2003    left, partial  . Knee arthroscopy  2005    left  . Small bowel enteroscopy  MAY 2012 DBE [facility] DR. GILLIAM    SB AVMS s/p APC  . Colonoscopy  OCT 2010/SEP 2011    tortuous colon, 3 polyps-benign(2010),   . Upper gastrointestinal endoscopy  '08, '10, SEP 2011    mild antral gastritis (2010), negative SB bx (2010), incomlpete Schatzki's ring (2011)  . Small bowel enteroscopy  SEP 2011 [facility]    Fields-NO AVMS  . Givens capsule study  NOV 2010     NO AVMS, normal  . Irrigation and debridement abscess  06/25/2012    Procedure: MINOR INCISION AND DRAINAGE OF ABSCESS;  Surgeon: [name], MD;  Location: [facility];  Service: General;  Laterality: [name];  Incision & Drainage of Infected Sebaceous Cyst on Chest  . Breast surgery    . Cardiac defibrillator placement      History   Social History  . Marital Status: Single    Spouse Name: [name]    Number of Children: 1  .  Years of Education: [name]   Occupational History  . disabled    Social History Main Topics  . Smoking status: Former Smoker -- 0.50 packs/day for 20 years    Types: Cigarettes    Quit date: 11/19/2009  . Smokeless tobacco: Never Used  . Alcohol Use: No  . Drug Use: No  . Sexual Activity: Yes    Birth Control/ Protection: Implant   Other Topics Concern  . 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    Does not get regular exercise.      Family History  Problem Relation Age of Onset  . Colon cancer Neg Hx     no family Hx of polyps too, uncle  . Cervical cancer Mother   . Hypertension Mother   . Cancer Mother   . Heart disease Father   . Hypertension Father   . GER disease Father   . Heart attack Father   . Bleeding Disorder Father   . Diabetes Sister   .  Heart disease Sister   . Hypertension Sister      Current Outpatient Prescriptions on File Prior to Visit  Medication Sig Dispense Refill  . albuterol (PROVENTIL HFA;VENTOLIN HFA) 108 (90 BASE) MCG/ACT inhaler Inhale 2 puffs into the lungs every 6 (six) hours as needed for wheezing or shortness of breath.      Marland [name] albuterol (PROVENTIL) (5 MG/ML) 0.5% nebulizer solution Take 0.5 mLs (2.5 mg total) by nebulization every 4 (four) hours as needed for wheezing or shortness of breath.  20 mL  11  . aspirin EC 81 MG EC tablet Take 1 tablet (81 mg total) by mouth daily.  30 tablet  3  . carvedilol (COREG) 6.25 MG tablet Take 1 tablet (6.25 mg total) by mouth 2 (two) times daily with a meal.  60 tablet  3  . cetirizine (ZYRTEC) 10 MG tablet Take 10 mg by mouth daily as needed for allergies.       . cyclobenzaprine (FLEXERIL) 10 MG tablet Take 10 mg by mouth 3 (three) times daily as needed for muscle spasms.       . digoxin (LANOXIN) 0.125 MG tablet Take 1 tablet (0.125 mg total) by mouth daily.  90 tablet  3  . docusate sodium (STOOL SOFTENER) 100 MG capsule Take 100 mg by mouth daily.       . ferrous sulfate 325 (65 FE) MG tablet Take 325 mg by mouth 3 (three) times daily.      Marland [name] gabapentin (NEURONTIN) 300 MG capsule Take 600 mg by mouth 4 (four) times daily.      Marland [name] HYDROcodone-acetaminophen (NORCO) 10-325 MG per tablet Take 1 tablet by mouth every 4 (four) hours as needed for moderate pain.       Marland [name] insulin aspart (NOVOLOG) 100 UNIT/ML injection Inject 0-20 Units into the skin 3 (three) times daily with meals.  1 vial  5  . insulin detemir (LEVEMIR) 100 UNIT/ML injection Inject 80 Units into the skin at bedtime.      . ivabradine HCl (CORLANOR) 5 MG TABS tablet Take 1 tablet (5 mg total) by mouth 2 (two) times daily with a meal.  60 tablet  6  . losartan (COZAAR) 50 MG tablet Take 1 tablet (50 mg total) by mouth daily.  90 tablet  3  . omeprazole (PRILOSEC) 40 MG capsule Take 1 capsule (40 mg total) by  mouth daily.  30 capsule  6  . potassium chloride SA (K-DUR,KLOR-CON) 20 MEQ tablet Take 1 tablet (20 mEq total) by mouth 2 (two) times daily. Or as instructed when to take extra.  190 tablet  3  . spironolactone (ALDACTONE) 25 MG tablet Take 1 tablet (25 mg total) by mouth daily.  30 tablet  3  . temazepam (RESTORIL) 30 MG capsule Take 30 mg by mouth at bedtime.      . torsemide (DEMADEX) 20 MG tablet Take 3 tabs in the AM (60 mg) and take 2 tabs in the PM (40 mg)  150 tablet  3  . vitamin E 400 UNIT capsule Take 400 Units by mouth daily.       No current facility-administered medications on file prior to visit.    Allergies  Allergen Reactions  . Cymbalta [Duloxetine Hcl] Other (See Comments)    "Spontaneous type behavior"  . Lyrica [Pregabalin] Swelling  . Trazodone And Nefazodone Other (See Comments)    Nightmares      REVIEW OF SYSTEMS:  (Positives checked otherwise negative)  CARDIOVASCULAR:  []  chest pain, []  chest pressure, []  palpitations, [x]  shortness of breath when laying flat, [x]  shortness of breath with exertion,  [x]  pain in feet when walking, []  pain in feet when laying flat, []  history of blood clot in veins (DVT), []  history of phlebitis, [x]  swelling in legs, []  varicose veins  PULMONARY:  [x]  productive cough, [x]  asthma, []  wheezing  NEUROLOGIC:  [x]  weakness in arms or legs, []  numbness in arms or legs, []  difficulty speaking or slurred speech, []  temporary loss of vision in one eye, []  dizziness  HEMATOLOGIC:  []  bleeding problems, []  problems with blood clotting too easily  MUSCULOSKEL:  []  joint pain, []  joint swelling  GASTROINTEST:  []  vomiting blood, []  blood in stool     GENITOURINARY:  []  burning with urination, []  blood in urine  PSYCHIATRIC:  []  history of major depression  INTEGUMENTARY:  []  rashes, []  ulcers  CONSTITUTIONAL:  []  fever, []  chills   Physical Examination Filed Vitals:   07/10/14 1044  BP: 115/75  Pulse: 82  Height: 5'  5" (1.651 m)  Weight: 262 lb 6.4 oz (119.024 kg)  SpO2: 100%   Body mass index is 43.67 kg/(m^2).  General: A&O x 3, WD, morbidly obese  Head: Loa/AT  Ear/Nose/Throat: Hearing grossly intact, nares w/o erythema or drainage, oropharynx w/o Erythema/Exudate  Eyes: PERRLA, EOMI  Neck: Supple, no nuchal rigidity, no palpable LAD  Pulmonary: Sym exp, good air movt, CTAB, no rales, rhonchi, & wheezing  Cardiac: RRR, Nl S1, S2, no Murmurs, rubs or gallops  Vascular: Vessel Right Left  Radial Palpable Palpable  Brachial Palpable Palpable  Carotid Palpable, without bruit Palpable, without bruit  Aorta Not palpable due to obesity [name]  Femoral Palpable Palpable  Popliteal Not palpable Not palpable  PT Faintly Palpable Faintly Palpable  DP Palpable Palpable   Gastrointestinal: soft, NTND, -G/R, - HSM, - masses, - CVAT B  Musculoskeletal: M/S 5/5 throughout , Extremities without ischemic changes , RLE 1-2+, LLE 1+  Neurologic: CN 2-12 intact , Pain and light touch intact in extremities , Motor exam as listed above  Psychiatric: Judgment intact, Mood & affect appropriate for pt's clinical situation  Dermatologic: See M/S exam for extremity exam, no rashes otherwise noted  Lymph : No Cervical, Axillary, or Inguinal lymphadenopathy   Non-Invasive Vascular Imaging  BLE Venous Insufficiency Duplex (Date: 07/10/2014):   RLE:  no DVT and SVT, no GSV reflux, no deep venous reflux  LLE: nO DVT and SVT, no GSV reflux, no deep venous reflux   Medical Decision Making  SAMAIAH MOESCH is a 48 y.o. female who presents with: no evidence of chronic venous insufficiency, bilateral leg swelling of medical etiology   Based on the patient's history and examination, I recommend: OTC compression stockings.  Chronology of sx is more consistent with a medical etiology.  No need for therapeutic compression stockings or any venous interventions.  Thank you for allowing [place] to participate in this  patient's care.  Leonides Sake, MD [facility] Office: [phone] Pager: [phone]  07/10/2014, 11:33 AM

## 2014-07-14 ENCOUNTER — Encounter: Payer: Self-pay | Admitting: Internal Medicine

## 2014-07-21 ENCOUNTER — Encounter (HOSPITAL_COMMUNITY): Payer: Medicare Other

## 2014-07-28 ENCOUNTER — Other Ambulatory Visit: Payer: Self-pay | Admitting: Physician Assistant

## 2014-07-28 ENCOUNTER — Telehealth: Payer: Self-pay | Admitting: *Deleted

## 2014-07-28 ENCOUNTER — Ambulatory Visit (INDEPENDENT_AMBULATORY_CARE_PROVIDER_SITE_OTHER): Payer: Medicare Other | Admitting: Internal Medicine

## 2014-07-28 ENCOUNTER — Telehealth: Payer: Self-pay | Admitting: Physician Assistant

## 2014-07-28 ENCOUNTER — Encounter: Payer: Self-pay | Admitting: Internal Medicine

## 2014-07-28 DIAGNOSIS — I472 Ventricular tachycardia, unspecified: Secondary | ICD-10-CM

## 2014-07-28 DIAGNOSIS — I5022 Chronic systolic (congestive) heart failure: Secondary | ICD-10-CM

## 2014-07-28 DIAGNOSIS — Z95810 Presence of automatic (implantable) cardiac defibrillator: Secondary | ICD-10-CM

## 2014-07-28 LAB — MDC_IDC_ENUM_SESS_TYPE_INCLINIC
Brady Statistic RA Percent Paced: 1 % — CL
Brady Statistic RV Percent Paced: 97 %
Date Time Interrogation Session: 20151117050000
HIGH POWER IMPEDANCE MEASURED VALUE: 56 Ohm
HighPow Impedance: 42 Ohm
Implantable Pulse Generator Serial Number: 108094
Lead Channel Impedance Value: 515 Ohm
Lead Channel Pacing Threshold Amplitude: 0.7 V
Lead Channel Pacing Threshold Amplitude: 0.8 V
Lead Channel Pacing Threshold Amplitude: 1.5 V
Lead Channel Pacing Threshold Pulse Width: 0.3 ms
Lead Channel Pacing Threshold Pulse Width: 0.5 ms
Lead Channel Pacing Threshold Pulse Width: 0.8 ms
Lead Channel Sensing Intrinsic Amplitude: 1.2 mV
Lead Channel Sensing Intrinsic Amplitude: 1.8 mV
Lead Channel Setting Pacing Amplitude: 2.2 V
Lead Channel Setting Pacing Amplitude: 2.5 V
Lead Channel Setting Pacing Pulse Width: 0.3 ms
Lead Channel Setting Pacing Pulse Width: 0.8 ms
Lead Channel Setting Sensing Sensitivity: 0.5 mV
MDC IDC MSMT LEADCHNL LV IMPEDANCE VALUE: 1169 Ohm
MDC IDC MSMT LEADCHNL RV IMPEDANCE VALUE: 546 Ohm
MDC IDC MSMT LEADCHNL RV SENSING INTR AMPL: 21.8 mV
MDC IDC SET LEADCHNL LV SENSING SENSITIVITY: 1 mV
MDC IDC SET LEADCHNL RA PACING AMPLITUDE: 2 V
MDC IDC SET ZONE DETECTION INTERVAL: 343 ms
Zone Setting Detection Interval: 250 ms
Zone Setting Detection Interval: 300 ms

## 2014-07-28 MED ORDER — AMIODARONE HCL 200 MG PO TABS: 200.0000 mg | ORAL_TABLET | Freq: Two times a day (BID) | ORAL | Status: DC

## 2014-07-28 MED ORDER — AMIODARONE HCL 200 MG PO TABS: 200.0000 mg | ORAL_TABLET | Freq: Every day | ORAL | Status: DC

## 2014-07-28 NOTE — Patient Instructions (Signed)
Your physician recommends that you schedule a follow-up appointment in: 4 weeks with Dr. [name]  Your physician has recommended you make the following change in your medication:  1) Start Amiodarone 200mg  twice daily

## 2014-07-28 NOTE — Progress Notes (Signed)
HPI [name] returns today for followup with an unscheduled visit because of recurrent VT. She is over a year out from her BiV ICD implant. She has had 3 ICD shocks in the past week, with 2 shocks for VT/VF in the past 3 days. She denies anginal symptoms but does think that her heart failure symptoms have worsened. She c/o recurrent ICD shock. She notes that her blood pressure runs low usually and she will often have to stop what she is doing to rest. Denies peripheral edema. She is still coughing despite switching from an ACE inhibitor to an ARB.  Allergies  Allergen Reactions  . Cymbalta [Duloxetine Hcl] Other (See Comments)    "Spontaneous type behavior"  . Lyrica [Pregabalin] Swelling  . Trazodone And Nefazodone Other (See Comments)    Nightmares      Current Outpatient Prescriptions  Medication Sig Dispense Refill  . albuterol (PROVENTIL HFA;VENTOLIN HFA) 108 (90 BASE) MCG/ACT inhaler Inhale 2 puffs into the lungs every 6 (six) hours as needed for wheezing or shortness of breath.    Marland Kitchen albuterol (PROVENTIL) (5 MG/ML) 0.5% nebulizer solution Take 0.5 mLs (2.5 mg total) by nebulization every 4 (four) hours as needed for wheezing or shortness of breath. 20 mL 11  . amiodarone (PACERONE) 200 MG tablet Take 1 tablet (200 mg total) by mouth daily. 180 tablet 3  . aspirin EC 81 MG EC tablet Take 1 tablet (81 mg total) by mouth daily. 30 tablet 3  . carvedilol (COREG) 6.25 MG tablet Take 1 tablet (6.25 mg total) by mouth 2 (two) times daily with a meal. 60 tablet 3  . cetirizine (ZYRTEC) 10 MG tablet Take 10 mg by mouth daily as needed for allergies.     . cyclobenzaprine (FLEXERIL) 10 MG tablet Take 10 mg by mouth 3 (three) times daily as needed for muscle spasms.     . digoxin (LANOXIN) 0.125 MG tablet Take 1 tablet (0.125 mg total) by mouth daily. 90 tablet 3  . docusate sodium (STOOL SOFTENER) 100 MG capsule Take 100 mg by mouth daily.     . ferrous sulfate 325 (65 FE) MG tablet Take 325  mg by mouth 3 (three) times daily.    Marland Kitchen gabapentin (NEURONTIN) 300 MG capsule Take 600 mg by mouth 4 (four) times daily.    Marland Kitchen HYDROcodone-acetaminophen (NORCO) 10-325 MG per tablet Take 1 tablet by mouth every 4 (four) hours as needed for moderate pain.     Marland Kitchen insulin aspart (NOVOLOG) 100 UNIT/ML injection Inject 0-20 Units into the skin 3 (three) times daily with meals. 1 vial 5  . insulin detemir (LEVEMIR) 100 UNIT/ML injection Inject 80 Units into the skin at bedtime.    . ivabradine HCl (CORLANOR) 5 MG TABS tablet Take 1 tablet (5 mg total) by mouth 2 (two) times daily with a meal. 60 tablet 6  . losartan (COZAAR) 50 MG tablet Take 1 tablet (50 mg total) by mouth daily. 90 tablet 3  . omeprazole (PRILOSEC) 40 MG capsule Take 1 capsule (40 mg total) by mouth daily. 30 capsule 6  . potassium chloride SA (K-DUR,KLOR-CON) 20 MEQ tablet Take 1 tablet (20 mEq total) by mouth 2 (two) times daily. Or as instructed when to take extra. 190 tablet 3  . spironolactone (ALDACTONE) 25 MG tablet Take 1 tablet (25 mg total) by mouth daily. 30 tablet 3  . temazepam (RESTORIL) 30 MG capsule Take 30 mg by mouth at bedtime.    . torsemide (DEMADEX)  20 MG tablet Take 3 tabs in the AM (60 mg) and take 2 tabs in the PM (40 mg) 150 tablet 3  . vitamin E 400 UNIT capsule Take 400 Units by mouth daily.     No current facility-administered medications for this visit.     Past Medical History  Diagnosis Date  . CHF (congestive heart failure)     a. EF 30-35%, RV mildly dilated (difficult study 11/2013) b. RHC (12/2013): RA 38/37 (35), RV 90/28, PA 95/51 (71), PCWP 54, PA 40%, AO 96 %, CO/CI Fick 3.2/1.4, PVR 5.3   . Nonischemic cardiomyopathy     a. LHC (12/2013) normal coronary anatomy   . Mitral regurgitation     moderate to severe  . S/P cardiac catheterization   . LBBB (left bundle branch block)     s/p [employer] CRT-D  . HTN (hypertension)   . Asthma   . IBS (irritable bowel syndrome)     with  primarily constipation  . Morbid obesity   . GERD (gastroesophageal reflux disease)   . IDA (iron deficiency anemia)     2o TO SB AVMS, Hx parenteral iron Dr Mariel Sleet  . AVM (arteriovenous malformation)     Small bowel, s/p duble balloon enteroscopy/APC jejunum Dr Gwinda Passe [facility]) 01/23/2011  . Anemia, chronic disease   . Peripheral neuropathy   . OSA (obstructive sleep apnea)     on CPAP qhs  . GI bleed 06/2009    Hgb 7.5, ferritin 7, transfusion, iron IV  . Dyspnea on exertion     chronic  . Diabetes mellitus without complication     ROS:   All systems reviewed and negative except as noted in the HPI.   Past Surgical History  Procedure Laterality Date  . Crdt-implantation  4/07    [employer]. remote- yes  . Appendectomy    . Cholecystectomy      biliary dyskinesia  . Mastectomy  2003    left, partial  . Knee arthroscopy  2005    left  . Small bowel enteroscopy  MAY 2012 D[facility] DR. GILLIAM    SB AVMS s/p APC  . Colonoscopy  OCT 2010/SEP 2011    tortuous colon, 3 polyps-benign(2010),   . Upper gastrointestinal endoscopy  '08, '10, SEP 2011    mild antral gastritis (2010), negative SB bx (2010), incomlpete Schatzki's ring (2011)  . Small bowel enteroscopy  SEP 2011 [facility]    Fields-NO AVMS  . Givens capsule study  NOV 2010     NO AVMS, normal  . Irrigation and debridement abscess  06/25/2012    Procedure: MINOR INCISION AND DRAINAGE OF ABSCESS;  Surgeon: [name], MD;  Location: [facility];  Service: General;  Laterality: [name];  Incision & Drainage of Infected Sebaceous Cyst on Chest  . Breast surgery    . Cardiac defibrillator placement       Family History  Problem Relation Age of Onset  . Colon cancer Neg Hx     no family Hx of polyps too, uncle  . Cervical cancer Mother   . Hypertension Mother   . Cancer Mother   . Heart disease Father   . Hypertension Father   . GER disease Father   . Heart attack Father   . Bleeding Disorder Father    . Diabetes Sister   . Heart disease Sister   . Hypertension Sister      History   Social History  . Marital Status:  Single    Spouse Name: [name]    Number of Children: 1  . Years of Education: [name]   Occupational History  . disabled    Social History Main Topics  . Smoking status: Former Smoker -- 0.50 packs/day for 20 years    Types: Cigarettes    Quit date: 11/19/2009  . Smokeless tobacco: Never Used  . Alcohol Use: No  . Drug Use: No  . Sexual Activity: Yes    Birth Control/ Protection: Implant   Other Topics Concern  . 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    Does not get regular exercise.       LMP 06/21/2012  Physical Exam:  obese appearing 48 year old woman, NAD HEENT: Unremarkable Neck:  6 cm JVD, no thyromegally Back:  No CVA tenderness Lungs:  Clear with no wheezes, rales, or rhonchi. HEART:  Regular rate rhythm, no murmurs, no rubs, no clicks, heart sounds are distant Abd:  soft, positive bowel sounds, no organomegally, no rebound, no guarding Ext:  2 plus pulses, no edema, no cyanosis, no clubbing Skin:  No rashes no nodules Neuro:  CN II through XII intact, motor grossly intact   DEVICE  Normal device function.  See PaceArt for details.   Assess/Plan:

## 2014-07-28 NOTE — Telephone Encounter (Signed)
Pt having syncope & pre-syncope. Pt feels "heart flying." Pt aware she was shocked on 07/23/14 & 07/20/14 (**transmission not received until today). On 11/12, pt was on phone talking to sister. On 11/9, pt woke up from kitchen floor. Pt stated she turned off her Latitude monitor. She turned it back on after a choking sensation. (Fiance was in background yelling at her to keep it plugged in).  Per pt, recently changed meds: benazepril to losartan (due to coughing). Pt also now on corlanor. Pt now stays in bed since starting/changing these specific meds.   Pt aware no driving [id number].  Coming to device clinic today at 1:30. Fiance driving.

## 2014-07-28 NOTE — Assessment & Plan Note (Signed)
She has had 3 episodes of VT/VF over the past week. I have offered the patient admit for initiation of IV amio and medical therapy for CHF but she declines and wishes to be initiated as an outpatient. Will start amiodarone 200 mg twice daily.

## 2014-07-28 NOTE — Assessment & Plan Note (Signed)
Her [employer] device is working normally and is appropriately treating her VT/VF. Will follow.

## 2014-07-28 NOTE — Telephone Encounter (Signed)
Received a call from the patient's mother regarding her medications. Dr. [name] had told her to take amiodarone 200 mg twice a day but the pharmacy only sent her enough to take one tablet daily.  Confirmed with Dr. [name] that she was supposed to be taking the amiodarone 200 mg twice a day. He also requested that she take 2 tablets tonight, 2 tablets twice a day tomorrow and then start 1 tablet, or 200 mg twice a day on 11/19.  I recontacted the patient and advised her of this and sent a new prescription to the pharmacy.  The patient wrote down the instructions and stated she understood.  Theodore Demark, PA-C 07/28/2014 6:35 PM Beeper [phone]

## 2014-07-28 NOTE — Assessment & Plan Note (Signed)
Her symptoms appear to be worse. She will continue her current meds.  She may need admit for IV diuresis. She would prefer to be managed as an outpatient. I have reminded her that she is still at some increased risk. She understands. She will follow up in the heart failure clinic in the next few weeks.

## 2014-07-29 ENCOUNTER — Encounter: Payer: Self-pay | Admitting: Internal Medicine

## 2014-07-30 ENCOUNTER — Emergency Department (HOSPITAL_COMMUNITY): Payer: Medicare Other

## 2014-07-30 ENCOUNTER — Encounter (HOSPITAL_COMMUNITY): Payer: Self-pay | Admitting: Emergency Medicine

## 2014-07-30 ENCOUNTER — Inpatient Hospital Stay (HOSPITAL_COMMUNITY)
Admission: EM | Admit: 2014-07-30 | Discharge: 2014-08-01 | DRG: 313 | Disposition: A | Discharge status: Home / Self Care | Payer: Medicare Other | Attending: Internal Medicine | Admitting: Internal Medicine

## 2014-07-30 DIAGNOSIS — I959 Hypotension, unspecified: Secondary | ICD-10-CM | POA: Diagnosis present

## 2014-07-30 DIAGNOSIS — D638 Anemia in other chronic diseases classified elsewhere: Secondary | ICD-10-CM | POA: Diagnosis present

## 2014-07-30 DIAGNOSIS — Z7982 Long term (current) use of aspirin: Secondary | ICD-10-CM

## 2014-07-30 DIAGNOSIS — Z794 Long term (current) use of insulin: Secondary | ICD-10-CM

## 2014-07-30 DIAGNOSIS — Z6841 Body Mass Index (BMI) 40.0 and over, adult: Secondary | ICD-10-CM

## 2014-07-30 DIAGNOSIS — K219 Gastro-esophageal reflux disease without esophagitis: Secondary | ICD-10-CM | POA: Diagnosis present

## 2014-07-30 DIAGNOSIS — E1165 Type 2 diabetes mellitus with hyperglycemia: Secondary | ICD-10-CM | POA: Diagnosis present

## 2014-07-30 DIAGNOSIS — Z8249 Family history of ischemic heart disease and other diseases of the circulatory system: Secondary | ICD-10-CM

## 2014-07-30 DIAGNOSIS — I4901 Ventricular fibrillation: Secondary | ICD-10-CM | POA: Diagnosis present

## 2014-07-30 DIAGNOSIS — I472 Ventricular tachycardia, unspecified: Secondary | ICD-10-CM | POA: Insufficient documentation

## 2014-07-30 DIAGNOSIS — Z87891 Personal history of nicotine dependence: Secondary | ICD-10-CM

## 2014-07-30 DIAGNOSIS — E1129 Type 2 diabetes mellitus with other diabetic kidney complication: Secondary | ICD-10-CM

## 2014-07-30 DIAGNOSIS — I272 Other secondary pulmonary hypertension: Secondary | ICD-10-CM | POA: Diagnosis present

## 2014-07-30 DIAGNOSIS — Z9010 Acquired absence of unspecified breast and nipple: Secondary | ICD-10-CM | POA: Diagnosis present

## 2014-07-30 DIAGNOSIS — I5023 Acute on chronic systolic (congestive) heart failure: Secondary | ICD-10-CM | POA: Insufficient documentation

## 2014-07-30 DIAGNOSIS — R0789 Other chest pain: Principal | ICD-10-CM | POA: Diagnosis present

## 2014-07-30 DIAGNOSIS — K589 Irritable bowel syndrome without diarrhea: Secondary | ICD-10-CM | POA: Diagnosis present

## 2014-07-30 DIAGNOSIS — I519 Heart disease, unspecified: Secondary | ICD-10-CM | POA: Insufficient documentation

## 2014-07-30 DIAGNOSIS — R739 Hyperglycemia, unspecified: Secondary | ICD-10-CM

## 2014-07-30 DIAGNOSIS — Z79899 Other long term (current) drug therapy: Secondary | ICD-10-CM

## 2014-07-30 DIAGNOSIS — I429 Cardiomyopathy, unspecified: Secondary | ICD-10-CM | POA: Diagnosis present

## 2014-07-30 DIAGNOSIS — J45909 Unspecified asthma, uncomplicated: Secondary | ICD-10-CM | POA: Diagnosis present

## 2014-07-30 DIAGNOSIS — Z96652 Presence of left artificial knee joint: Secondary | ICD-10-CM | POA: Diagnosis present

## 2014-07-30 DIAGNOSIS — I10 Essential (primary) hypertension: Secondary | ICD-10-CM | POA: Insufficient documentation

## 2014-07-30 DIAGNOSIS — R079 Chest pain, unspecified: Secondary | ICD-10-CM | POA: Diagnosis present

## 2014-07-30 DIAGNOSIS — I5022 Chronic systolic (congestive) heart failure: Secondary | ICD-10-CM | POA: Diagnosis present

## 2014-07-30 DIAGNOSIS — E876 Hypokalemia: Secondary | ICD-10-CM | POA: Diagnosis present

## 2014-07-30 DIAGNOSIS — Z833 Family history of diabetes mellitus: Secondary | ICD-10-CM

## 2014-07-30 DIAGNOSIS — G4733 Obstructive sleep apnea (adult) (pediatric): Secondary | ICD-10-CM | POA: Diagnosis present

## 2014-07-30 DIAGNOSIS — Z8049 Family history of malignant neoplasm of other genital organs: Secondary | ICD-10-CM

## 2014-07-30 DIAGNOSIS — Z95810 Presence of automatic (implantable) cardiac defibrillator: Secondary | ICD-10-CM | POA: Diagnosis present

## 2014-07-30 LAB — CBC WITH DIFFERENTIAL/PLATELET
Basophils Absolute: 0 10*3/uL (ref 0.0–0.1)
Basophils Relative: 0 % (ref 0–1)
EOS PCT: 1 % (ref 0–5)
Eosinophils Absolute: 0.1 10*3/uL (ref 0.0–0.7)
HCT: 39.2 % (ref 36.0–46.0)
Hemoglobin: 13.1 g/dL (ref 12.0–15.0)
LYMPHS ABS: 2.4 10*3/uL (ref 0.7–4.0)
LYMPHS PCT: 32 % (ref 12–46)
MCH: 29.8 pg (ref 26.0–34.0)
MCHC: 33.4 g/dL (ref 30.0–36.0)
MCV: 89.1 fL (ref 78.0–100.0)
Monocytes Absolute: 0.3 10*3/uL (ref 0.1–1.0)
Monocytes Relative: 4 % (ref 3–12)
NEUTROS ABS: 4.6 10*3/uL (ref 1.7–7.7)
Neutrophils Relative %: 63 % (ref 43–77)
PLATELETS: 257 10*3/uL (ref 150–400)
RBC: 4.4 MIL/uL (ref 3.87–5.11)
RDW: 12.7 % (ref 11.5–15.5)
WBC: 7.4 10*3/uL (ref 4.0–10.5)

## 2014-07-30 LAB — BASIC METABOLIC PANEL
Anion gap: 15 (ref 5–15)
BUN: 11 mg/dL (ref 6–23)
CHLORIDE: 87 meq/L — AB (ref 96–112)
CO2: 31 meq/L (ref 19–32)
Calcium: 8.1 mg/dL — ABNORMAL LOW (ref 8.4–10.5)
Creatinine, Ser: 0.91 mg/dL (ref 0.50–1.10)
GFR calc Af Amer: 85 mL/min — ABNORMAL LOW (ref >90)
GFR calc non Af Amer: 73 mL/min — ABNORMAL LOW (ref >90)
Glucose, Bld: 434 mg/dL — ABNORMAL HIGH (ref 70–99)
Potassium: 3.1 mEq/L — ABNORMAL LOW (ref 3.7–5.3)
SODIUM: 133 meq/L — AB (ref 137–147)

## 2014-07-30 LAB — DIGOXIN LEVEL: Digoxin Level: 0.6 ng/mL — ABNORMAL LOW (ref 0.8–2.0)

## 2014-07-30 LAB — TROPONIN I

## 2014-07-30 MED ORDER — POTASSIUM CHLORIDE CRYS ER 20 MEQ PO TBCR
40.0000 meq | EXTENDED_RELEASE_TABLET | Freq: Two times a day (BID) | ORAL | Status: DC
  Administered 2014-07-30: 40 meq via ORAL
  Filled 2014-07-30: qty 2

## 2014-07-30 MED ORDER — DIGOXIN 125 MCG PO TABS
0.1250 mg | ORAL_TABLET | Freq: Once | ORAL | Status: AC
  Administered 2014-07-31: 0.125 mg via ORAL
  Filled 2014-07-30 (×2): qty 1

## 2014-07-30 MED ORDER — INSULIN ASPART 100 UNIT/ML ~~LOC~~ SOLN
10.0000 [IU] | Freq: Once | SUBCUTANEOUS | Status: AC
  Administered 2014-07-30: 10 [IU] via SUBCUTANEOUS
  Filled 2014-07-30: qty 1

## 2014-07-30 MED ORDER — ACETAMINOPHEN 500 MG PO TABS
1000.0000 mg | ORAL_TABLET | Freq: Once | ORAL | Status: AC
  Administered 2014-07-30: 1000 mg via ORAL
  Filled 2014-07-30: qty 2

## 2014-07-30 NOTE — ED Provider Notes (Signed)
CSN: [id number]     Arrival date & time 07/30/14  2130 History  This chart was scribed for Hanley Seamen, MD by Annye Asa, ED Scribe. This patient was seen in room APA10/APA10 and the patient's care was started at 11:12 PM.    Chief Complaint  Patient presents with  . Chest Pain   The history is provided by the patient. No language interpreter was used.     HPI Comments: [name] is a 48 y.o. female with past medical history of CHF, nonischemic cardiomyopathy, mitral regurgitation, LBBB, HTN, asthma, DM who presents to the Emergency Department complaining of chest discomfort beginning at 1800 tonight. She got up to go to the restroom and a pain spread across her chest, described as burning. She has been having similar pains over the past 3 weeks but this was more severe. She sat down and began feeling dizzy and SOB. The pain was moderate to severe and so she came to the ED. The acute pain has resolved on its own, though she still has soreness in her left chest from where her defibrillator has shocked her (see below). She has a headache at present.   She notes that her defibrillator has gone off three times within the past 2 weeks. She was seen by her cardiologist of her this week and was started on amiodarone. Admission to the hospital was advised at that time but she declined.   She also notes that she is struggling with the "side effects" of her medications, particularly Corlanor; she notes sharp pain in her eyes and a sensation of a "fan spinning above her head" (visual disturbances are one of the known side effects of Corlanor).   Patient reports that her blood sugar has recently been out of control; she states that she had been managing her DM well previously with no new changes in diet. She reports nausea, dizziness, and lightheadedness as ongoing symptoms in addition to her acute symptoms.   Past Medical History  Diagnosis Date  . CHF (congestive heart failure)     a. EF  30-35%, RV mildly dilated (difficult study 11/2013) b. RHC (12/2013): RA 38/37 (35), RV 90/28, PA 95/51 (71), PCWP 54, PA 40%, AO 96 %, CO/CI Fick 3.2/1.4, PVR 5.3   . Nonischemic cardiomyopathy     a. LHC (12/2013) normal coronary anatomy   . Mitral regurgitation     moderate to severe  . S/P cardiac catheterization   . LBBB (left bundle branch block)     s/p [employer] CRT-D  . HTN (hypertension)   . Asthma   . IBS (irritable bowel syndrome)     with primarily constipation  . Morbid obesity   . GERD (gastroesophageal reflux disease)   . IDA (iron deficiency anemia)     2o TO SB AVMS, Hx parenteral iron Dr Mariel Sleet  . AVM (arteriovenous malformation)     Small bowel, s/p duble balloon enteroscopy/APC jejunum Dr Gwinda Passe [facility]) 01/23/2011  . Anemia, chronic disease   . Peripheral neuropathy   . OSA (obstructive sleep apnea)     on CPAP qhs  . GI bleed 06/2009    Hgb 7.5, ferritin 7, transfusion, iron IV  . Dyspnea on exertion     chronic  . Diabetes mellitus without complication    Past Surgical History  Procedure Laterality Date  . Crdt-implantation  4/07    [employer]. remote- yes  . Appendectomy    . Cholecystectomy  biliary dyskinesia  . Mastectomy  2003    left, partial  . Knee arthroscopy  2005    left  . [facility]  [facility]    SB AVMS s/p APC  . Colonoscopy  OCT 2010/SEP 2011    tortuous colon, 3 polyps-benign(2010),   . Upper gastrointestinal endoscopy  '08, '10, SEP 2011    mild antral gastritis (2010), negative SB bx (2010), incomlpete Schatzki's ring (2011)  . Small bowel enteroscopy  SEP 2011 [facility]    Fields-NO AVMS  . Givens capsule study  NOV 2010     NO AVMS, normal  . Irrigation and debridement abscess  06/25/2012    Procedure: MINOR INCISION AND DRAINAGE OF ABSCESS;  Surgeon: Marlane HatcherWilliam S Bradford, MD;  Location: [facility];  Service: General;  Laterality: [name];  Incision & Drainage of Infected Sebaceous  Cyst on Chest  . Breast surgery    . Cardiac defibrillator placement     Family History  Problem Relation Age of Onset  . Colon cancer Neg Hx     no family Hx of polyps too, uncle  . Cervical cancer Mother   . Hypertension Mother   . Cancer Mother   . Heart disease Father   . Hypertension Father   . GER disease Father   . Heart attack Father   . Bleeding Disorder Father   . Diabetes Sister   . Heart disease Sister   . Hypertension Sister    History  Substance Use Topics  . Smoking status: Former Smoker -- 0.50 packs/day for 20 years    Types: Cigarettes    Quit date: 11/19/2009  . Smokeless tobacco: Never Used  . Alcohol Use: No   OB History    Gravida Para Term Preterm AB TAB SAB Ectopic Multiple Living   6 1 1  5  3 2        Review of Systems  A complete 10 system review of systems was obtained and all systems are negative except as noted in the HPI and PMH.   Allergies  Cymbalta; Lyrica; and Trazodone and nefazodone  Home Medications   Prior to Admission medications   Medication Sig Start Date End Date Taking? Authorizing Provider  albuterol (PROVENTIL HFA;VENTOLIN HFA) 108 (90 BASE) MCG/ACT inhaler Inhale 2 puffs into the lungs every 6 (six) hours as needed for wheezing or shortness of breath.   Yes Historical Provider, MD  albuterol (PROVENTIL) (5 MG/ML) 0.5% nebulizer solution Take 0.5 mLs (2.5 mg total) by nebulization every 4 (four) hours as needed for wheezing or shortness of breath. 11/21/12  Yes Milana ObeyStephen D Knowlton, MD  amiodarone (PACERONE) 200 MG tablet Take 1 tablet (200 mg total) by mouth 2 (two) times daily. 07/28/14  Yes Rhonda G Barrett, PA-C  aspirin EC 81 MG EC tablet Take 1 tablet (81 mg total) by mouth daily. 12/16/13  Yes Aundria RudAli B Cosgrove, NP  carvedilol (COREG) 6.25 MG tablet Take 1 tablet (6.25 mg total) by mouth 2 (two) times daily with a meal. 06/15/14  Yes Dolores Pattyaniel R Bensimhon, MD  cetirizine (ZYRTEC) 10 MG tablet Take 10 mg by mouth daily as needed  for allergies.    Yes Historical Provider, MD  cyclobenzaprine (FLEXERIL) 10 MG tablet Take 10 mg by mouth 3 (three) times daily as needed for muscle spasms.    Yes Historical Provider, MD  digoxin (LANOXIN) 0.125 MG tablet Take 1 tablet (0.125 mg total) by mouth daily. 05/05/14  Yes Aundria Rud, NP  docusate sodium (STOOL SOFTENER) 100 MG capsule Take 100 mg by mouth daily.    Yes Historical Provider, MD  ferrous sulfate 325 (65 FE) MG tablet Take 325 mg by mouth 3 (three) times daily.   Yes Historical Provider, MD  gabapentin (NEURONTIN) 300 MG capsule Take 600 mg by mouth 4 (four) times daily.   Yes Historical Provider, MD  HYDROcodone-acetaminophen (NORCO) 10-325 MG per tablet Take 1 tablet by mouth every 4 (four) hours as needed for moderate pain.  11/11/13  Yes Historical Provider, MD  insulin aspart (NOVOLOG) 100 UNIT/ML injection Inject 0-20 Units into the skin 3 (three) times daily with meals. 06/28/12  Yes [name], MD  insulin detemir (LEVEMIR) 100 UNIT/ML injection Inject 100 Units into the skin at bedtime.    Yes Historical Provider, MD  ivabradine HCl (CORLANOR) 5 MG TABS tablet Take 1 tablet (5 mg total) by mouth 2 (two) times daily with a meal. 07/03/14  Yes Laurey Morale, MD  losartan (COZAAR) 50 MG tablet Take 1 tablet (50 mg total) by mouth daily. 06/23/14  Yes Laurey Morale, MD  omeprazole (PRILOSEC) 40 MG capsule Take 1 capsule (40 mg total) by mouth daily. 03/27/13  Yes Marinus Maw, MD  potassium chloride SA (K-DUR,KLOR-CON) 20 MEQ tablet Take 1 tablet (20 mEq total) by mouth 2 (two) times daily. Or as instructed when to take extra. 05/05/14  Yes Aundria Rud, NP  spironolactone (ALDACTONE) 25 MG tablet Take 1 tablet (25 mg total) by mouth daily. 04/23/14  Yes Bevelyn Buckles Bensimhon, MD  temazepam (RESTORIL) 30 MG capsule Take 30 mg by mouth at bedtime.   Yes Historical Provider, MD  torsemide (DEMADEX) 20 MG tablet Take 3 tabs in the AM (60 mg) and take 2 tabs in the  PM (40 mg) Patient taking differently: Take 40-60 mg by mouth 2 (two) times daily. Take 3 tabs in the AM (60 mg) and take 2 tabs in the PM (40 mg) 06/23/14  Yes Laurey Morale, MD  vitamin E 400 UNIT capsule Take 400 Units by mouth daily.   Yes Historical Provider, MD   BP 102/69 mmHg  Pulse 66  Temp(Src) 98.1 F (36.7 C) (Oral)  Resp 16  Ht 5' (1.524 m)  Wt 269 lb (122.018 kg)  BMI 52.54 kg/m2  SpO2 100%  LMP 06/21/2012 Physical Exam  Nursing note and vitals reviewed.  General: Well-developed, obese female in no acute distress; appearance consistent with age of record HENT: normocephalic; atraumatic Eyes: pupils equal, round and reactive to light; extraocular muscles intact Neck: supple Heart: regular rate and rhythm with frequent ectopy, gallop heard at RUSB Lungs: clear to auscultation bilaterally; distant sounds  Abdomen: soft; obese; nontender; bowel sounds present Extremities: No deformity; full range of motion; pulses normal; no edema Neurologic: Awake, alert and oriented; motor function intact in all extremities and symmetric; no facial droop Skin: Warm and dry; chronic appearing skin thickening of lower legs Psychiatric: Normal mood and affect  ED Course  Procedures   DIAGNOSTIC STUDIES: Oxygen Saturation is 100% on 2L/min, normal by my interpretation.    COORDINATION OF CARE: 11:26 PM Discussed treatment plan with pt at bedside and pt agreed to plan.   MDM   Nursing notes and vitals signs, including pulse oximetry, reviewed.  Summary of this visit's results, reviewed by myself:  Labs:  Results for orders placed or performed during the hospital encounter of 07/30/14 (from the  past 24 hour(s))  CBC with Differential     Status: None   Collection Time: 07/30/14 10:04 PM  Result Value Ref Range   WBC 7.4 4.0 - 10.5 K/uL   RBC 4.40 3.87 - 5.11 MIL/uL   Hemoglobin 13.1 12.0 - 15.0 g/dL   HCT 16.1 09.6 - 04.5 %   MCV 89.1 78.0 - 100.0 fL   MCH 29.8 26.0 -  34.0 pg   MCHC 33.4 30.0 - 36.0 g/dL   RDW 40.9 81.1 - 91.4 %   Platelets 257 150 - 400 K/uL   Neutrophils Relative % 63 43 - 77 %   Neutro Abs 4.6 1.7 - 7.7 K/uL   Lymphocytes Relative 32 12 - 46 %   Lymphs Abs 2.4 0.7 - 4.0 K/uL   Monocytes Relative 4 3 - 12 %   Monocytes Absolute 0.3 0.1 - 1.0 K/uL   Eosinophils Relative 1 0 - 5 %   Eosinophils Absolute 0.1 0.0 - 0.7 K/uL   Basophils Relative 0 0 - 1 %   Basophils Absolute 0.0 0.0 - 0.1 K/uL  Basic metabolic panel     Status: Abnormal   Collection Time: 07/30/14 10:04 PM  Result Value Ref Range   Sodium 133 (L) 137 - 147 mEq/L   Potassium 3.1 (L) 3.7 - 5.3 mEq/L   Chloride 87 (L) 96 - 112 mEq/L   CO2 31 19 - 32 mEq/L   Glucose, Bld 434 (H) 70 - 99 mg/dL   BUN 11 6 - 23 mg/dL   Creatinine, Ser 7.82 0.50 - 1.10 mg/dL   Calcium 8.1 (L) 8.4 - 10.5 mg/dL   GFR calc non Af Amer 73 (L) >90 mL/min   GFR calc Af Amer 85 (L) >90 mL/min   Anion gap 15 5 - 15  Troponin I     Status: None   Collection Time: 07/30/14 10:04 PM  Result Value Ref Range   Troponin I <0.30 <0.30 ng/mL  Digoxin level     Status: Abnormal   Collection Time: 07/30/14 11:28 PM  Result Value Ref Range   Digoxin Level 0.6 (L) 0.8 - 2.0 ng/mL    Imaging Studies: Dg Chest Portable 1 View  07/30/2014   CLINICAL DATA:  Central chest pain RIGHT burning, nausea, vomiting shortness of breath, and defibrillator discharge, former smoker, partial history of LEFT mastectomy, CHF, non ischemic cardiomyopathy, hypertension, diabetes, asthma  EXAM: PORTABLE CHEST - 1 VIEW  COMPARISON:  Portable exam 2207 hr compared to 12/08/2013  FINDINGS: LEFT subclavian AICD leads project at RIGHT ventricle, RIGHT atrium, and coronary sinus.  Enlargement of cardiac silhouette with pulmonary vascular congestion.  Mediastinal contours normal.  Lungs grossly clear.  No pleural effusion or pneumothorax.  Bones unremarkable.  IMPRESSION: Enlargement of cardiac silhouette with pulmonary vascular  congestion post AICD.  No definite acute infiltrate.   Electronically Signed   By: Ulyses Southward M.D.   On: 07/30/2014 22:18        Hanley Seamen, MD 07/31/14 [phone]

## 2014-07-30 NOTE — ED Notes (Signed)
Pt c/o central chest pain with burning, nausea and vomiting. Pt c/o SOB. Pt states her defibrillator has fired 3 times, 3 different days. Pt sent to [facility] on Tues. Pt on new medication with "serious side effects" per pt.

## 2014-07-31 ENCOUNTER — Encounter: Payer: Self-pay | Admitting: Cardiology

## 2014-07-31 ENCOUNTER — Encounter (HOSPITAL_COMMUNITY): Payer: Self-pay | Admitting: *Deleted

## 2014-07-31 DIAGNOSIS — I472 Ventricular tachycardia, unspecified: Secondary | ICD-10-CM | POA: Insufficient documentation

## 2014-07-31 DIAGNOSIS — Z794 Long term (current) use of insulin: Secondary | ICD-10-CM | POA: Diagnosis not present

## 2014-07-31 DIAGNOSIS — I5023 Acute on chronic systolic (congestive) heart failure: Secondary | ICD-10-CM | POA: Insufficient documentation

## 2014-07-31 DIAGNOSIS — Z6841 Body Mass Index (BMI) 40.0 and over, adult: Secondary | ICD-10-CM | POA: Diagnosis not present

## 2014-07-31 DIAGNOSIS — E876 Hypokalemia: Secondary | ICD-10-CM | POA: Diagnosis present

## 2014-07-31 DIAGNOSIS — R0789 Other chest pain: Secondary | ICD-10-CM | POA: Diagnosis present

## 2014-07-31 DIAGNOSIS — Z95810 Presence of automatic (implantable) cardiac defibrillator: Secondary | ICD-10-CM

## 2014-07-31 DIAGNOSIS — I5022 Chronic systolic (congestive) heart failure: Secondary | ICD-10-CM | POA: Diagnosis present

## 2014-07-31 DIAGNOSIS — J45909 Unspecified asthma, uncomplicated: Secondary | ICD-10-CM | POA: Diagnosis present

## 2014-07-31 DIAGNOSIS — I519 Heart disease, unspecified: Secondary | ICD-10-CM

## 2014-07-31 DIAGNOSIS — Z8249 Family history of ischemic heart disease and other diseases of the circulatory system: Secondary | ICD-10-CM | POA: Diagnosis not present

## 2014-07-31 DIAGNOSIS — R079 Chest pain, unspecified: Secondary | ICD-10-CM

## 2014-07-31 DIAGNOSIS — Z96652 Presence of left artificial knee joint: Secondary | ICD-10-CM | POA: Diagnosis present

## 2014-07-31 DIAGNOSIS — Z87891 Personal history of nicotine dependence: Secondary | ICD-10-CM | POA: Diagnosis not present

## 2014-07-31 DIAGNOSIS — E1165 Type 2 diabetes mellitus with hyperglycemia: Secondary | ICD-10-CM | POA: Diagnosis present

## 2014-07-31 DIAGNOSIS — G4733 Obstructive sleep apnea (adult) (pediatric): Secondary | ICD-10-CM

## 2014-07-31 DIAGNOSIS — R739 Hyperglycemia, unspecified: Secondary | ICD-10-CM | POA: Insufficient documentation

## 2014-07-31 DIAGNOSIS — I10 Essential (primary) hypertension: Secondary | ICD-10-CM | POA: Insufficient documentation

## 2014-07-31 DIAGNOSIS — Z833 Family history of diabetes mellitus: Secondary | ICD-10-CM | POA: Diagnosis not present

## 2014-07-31 DIAGNOSIS — I4901 Ventricular fibrillation: Secondary | ICD-10-CM | POA: Diagnosis present

## 2014-07-31 DIAGNOSIS — K219 Gastro-esophageal reflux disease without esophagitis: Secondary | ICD-10-CM | POA: Diagnosis present

## 2014-07-31 DIAGNOSIS — I959 Hypotension, unspecified: Secondary | ICD-10-CM | POA: Diagnosis present

## 2014-07-31 DIAGNOSIS — R0782 Intercostal pain: Secondary | ICD-10-CM

## 2014-07-31 DIAGNOSIS — Z79899 Other long term (current) drug therapy: Secondary | ICD-10-CM | POA: Diagnosis not present

## 2014-07-31 DIAGNOSIS — Z8049 Family history of malignant neoplasm of other genital organs: Secondary | ICD-10-CM | POA: Diagnosis not present

## 2014-07-31 DIAGNOSIS — Z7982 Long term (current) use of aspirin: Secondary | ICD-10-CM | POA: Diagnosis not present

## 2014-07-31 DIAGNOSIS — I429 Cardiomyopathy, unspecified: Secondary | ICD-10-CM | POA: Diagnosis present

## 2014-07-31 DIAGNOSIS — E0822 Diabetes mellitus due to underlying condition with diabetic chronic kidney disease: Secondary | ICD-10-CM

## 2014-07-31 DIAGNOSIS — I272 Other secondary pulmonary hypertension: Secondary | ICD-10-CM | POA: Diagnosis present

## 2014-07-31 DIAGNOSIS — K589 Irritable bowel syndrome without diarrhea: Secondary | ICD-10-CM | POA: Diagnosis present

## 2014-07-31 DIAGNOSIS — E119 Type 2 diabetes mellitus without complications: Secondary | ICD-10-CM

## 2014-07-31 DIAGNOSIS — Z9010 Acquired absence of unspecified breast and nipple: Secondary | ICD-10-CM | POA: Diagnosis present

## 2014-07-31 DIAGNOSIS — D638 Anemia in other chronic diseases classified elsewhere: Secondary | ICD-10-CM | POA: Diagnosis present

## 2014-07-31 LAB — CBC
HCT: 38.2 % (ref 36.0–46.0)
HEMOGLOBIN: 12.7 g/dL (ref 12.0–15.0)
MCH: 30 pg (ref 26.0–34.0)
MCHC: 33.2 g/dL (ref 30.0–36.0)
MCV: 90.1 fL (ref 78.0–100.0)
PLATELETS: 264 10*3/uL (ref 150–400)
RBC: 4.24 MIL/uL (ref 3.87–5.11)
RDW: 12.9 % (ref 11.5–15.5)
WBC: 5.9 10*3/uL (ref 4.0–10.5)

## 2014-07-31 LAB — COMPREHENSIVE METABOLIC PANEL
ALK PHOS: 94 U/L (ref 39–117)
ALT: 6 U/L (ref 0–35)
AST: 13 U/L (ref 0–37)
Albumin: 3.1 g/dL — ABNORMAL LOW (ref 3.5–5.2)
Anion gap: 11 (ref 5–15)
BUN: 10 mg/dL (ref 6–23)
CO2: 34 mEq/L — ABNORMAL HIGH (ref 19–32)
Calcium: 7.9 mg/dL — ABNORMAL LOW (ref 8.4–10.5)
Chloride: 93 mEq/L — ABNORMAL LOW (ref 96–112)
Creatinine, Ser: 0.91 mg/dL (ref 0.50–1.10)
GFR calc non Af Amer: 73 mL/min — ABNORMAL LOW (ref >90)
GFR, EST AFRICAN AMERICAN: 85 mL/min — AB (ref >90)
GLUCOSE: 249 mg/dL — AB (ref 70–99)
Potassium: 3.4 mEq/L — ABNORMAL LOW (ref 3.7–5.3)
SODIUM: 138 meq/L (ref 137–147)
TOTAL PROTEIN: 7.7 g/dL (ref 6.0–8.3)
Total Bilirubin: 0.4 mg/dL (ref 0.3–1.2)

## 2014-07-31 LAB — GLUCOSE, CAPILLARY
GLUCOSE-CAPILLARY: 229 mg/dL — AB (ref 70–99)
GLUCOSE-CAPILLARY: 224 mg/dL — AB (ref 70–99)
GLUCOSE-CAPILLARY: 240 mg/dL — AB (ref 70–99)
Glucose-Capillary: 328 mg/dL — ABNORMAL HIGH (ref 70–99)
Glucose-Capillary: 271 mg/dL — ABNORMAL HIGH (ref 70–99)

## 2014-07-31 LAB — BASIC METABOLIC PANEL
ANION GAP: 11 (ref 5–15)
BUN: 10 mg/dL (ref 6–23)
CALCIUM: 8.3 mg/dL — AB (ref 8.4–10.5)
CO2: 34 mEq/L — ABNORMAL HIGH (ref 19–32)
Chloride: 94 mEq/L — ABNORMAL LOW (ref 96–112)
Creatinine, Ser: 0.9 mg/dL (ref 0.50–1.10)
GFR calc Af Amer: 86 mL/min — ABNORMAL LOW (ref >90)
GFR calc non Af Amer: 74 mL/min — ABNORMAL LOW (ref >90)
GLUCOSE: 248 mg/dL — AB (ref 70–99)
POTASSIUM: 3.4 meq/L — AB (ref 3.7–5.3)
SODIUM: 139 meq/L (ref 137–147)

## 2014-07-31 LAB — TROPONIN I: Troponin I: <0.3 ng/mL (ref <0.30)

## 2014-07-31 LAB — MRSA PCR SCREENING: MRSA by PCR: NEGATIVE

## 2014-07-31 LAB — MAGNESIUM: MAGNESIUM: 1.4 mg/dL — AB (ref 1.5–2.5)

## 2014-07-31 MED ORDER — SODIUM CHLORIDE 0.9 % IV SOLN
250.0000 mL | INTRAVENOUS | Status: DC | PRN
  Administered 2014-07-31: 250 mL via INTRAVENOUS

## 2014-07-31 MED ORDER — POTASSIUM CHLORIDE CRYS ER 20 MEQ PO TBCR
20.0000 meq | EXTENDED_RELEASE_TABLET | Freq: Two times a day (BID) | ORAL | Status: DC
  Administered 2014-08-01: 20 meq via ORAL
  Filled 2014-07-31: qty 1

## 2014-07-31 MED ORDER — ONDANSETRON HCL 4 MG PO TABS
4.0000 mg | ORAL_TABLET | Freq: Four times a day (QID) | ORAL | Status: DC | PRN
  Administered 2014-07-31: 4 mg via ORAL
  Filled 2014-07-31: qty 1

## 2014-07-31 MED ORDER — SPIRONOLACTONE 25 MG PO TABS
25.0000 mg | ORAL_TABLET | Freq: Every day | ORAL | Status: DC
  Administered 2014-07-31 – 2014-08-01 (×2): 25 mg via ORAL
  Filled 2014-07-31 (×2): qty 1

## 2014-07-31 MED ORDER — ONDANSETRON HCL 4 MG/2ML IJ SOLN
4.0000 mg | Freq: Four times a day (QID) | INTRAMUSCULAR | Status: DC | PRN
  Administered 2014-07-31: 4 mg via INTRAVENOUS
  Filled 2014-07-31: qty 2

## 2014-07-31 MED ORDER — MORPHINE SULFATE 2 MG/ML IJ SOLN: 2.0000 mg | INTRAMUSCULAR | Status: DC | PRN

## 2014-07-31 MED ORDER — ALBUTEROL SULFATE (2.5 MG/3ML) 0.083% IN NEBU: 2.5000 mg | INHALATION_SOLUTION | RESPIRATORY_TRACT | Status: DC | PRN

## 2014-07-31 MED ORDER — INSULIN ASPART 100 UNIT/ML ~~LOC~~ SOLN
0.0000 [IU] | Freq: Three times a day (TID) | SUBCUTANEOUS | Status: DC
  Administered 2014-07-31: 8 [IU] via SUBCUTANEOUS
  Administered 2014-07-31: 5 [IU] via SUBCUTANEOUS
  Administered 2014-07-31: 8 [IU] via SUBCUTANEOUS
  Administered 2014-07-31: 11 [IU] via SUBCUTANEOUS
  Administered 2014-08-01: 3 [IU] via SUBCUTANEOUS
  Administered 2014-08-01: 8 [IU] via SUBCUTANEOUS

## 2014-07-31 MED ORDER — INSULIN DETEMIR 100 UNIT/ML ~~LOC~~ SOLN
100.0000 [IU] | Freq: Every day | SUBCUTANEOUS | Status: DC
  Administered 2014-07-31 (×2): 100 [IU] via SUBCUTANEOUS
  Filled 2014-07-31 (×4): qty 1

## 2014-07-31 MED ORDER — LOSARTAN POTASSIUM 50 MG PO TABS
50.0000 mg | ORAL_TABLET | Freq: Every day | ORAL | Status: DC
  Administered 2014-07-31 – 2014-08-01 (×2): 50 mg via ORAL
  Filled 2014-07-31 (×2): qty 1

## 2014-07-31 MED ORDER — TORSEMIDE 20 MG PO TABS
60.0000 mg | ORAL_TABLET | Freq: Every day | ORAL | Status: DC
  Administered 2014-07-31 – 2014-08-01 (×2): 60 mg via ORAL
  Filled 2014-07-31 (×2): qty 3

## 2014-07-31 MED ORDER — INSULIN ASPART 100 UNIT/ML ~~LOC~~ SOLN
0.0000 [IU] | Freq: Every day | SUBCUTANEOUS | Status: DC
  Administered 2014-07-31: 2 [IU] via SUBCUTANEOUS

## 2014-07-31 MED ORDER — TORSEMIDE 20 MG PO TABS
40.0000 mg | ORAL_TABLET | Freq: Every day | ORAL | Status: DC
  Administered 2014-07-31 (×2): 40 mg via ORAL
  Filled 2014-07-31 (×2): qty 2

## 2014-07-31 MED ORDER — IVABRADINE HCL 5 MG PO TABS
2.5000 mg | ORAL_TABLET | Freq: Two times a day (BID) | ORAL | Status: DC
  Administered 2014-08-01: 2.5 mg via ORAL

## 2014-07-31 MED ORDER — TEMAZEPAM 15 MG PO CAPS
30.0000 mg | ORAL_CAPSULE | Freq: Every day | ORAL | Status: DC
Start: 2014-07-31 — End: 2014-08-01
  Administered 2014-07-31 (×2): 30 mg via ORAL
  Filled 2014-07-31 (×2): qty 2

## 2014-07-31 MED ORDER — ASPIRIN EC 81 MG PO TBEC
81.0000 mg | DELAYED_RELEASE_TABLET | Freq: Every day | ORAL | Status: DC
Start: 2014-07-31 — End: 2014-08-01
  Administered 2014-07-31 – 2014-08-01 (×2): 81 mg via ORAL
  Filled 2014-07-31 (×2): qty 1

## 2014-07-31 MED ORDER — POTASSIUM CHLORIDE 10 MEQ/100ML IV SOLN
10.0000 meq | INTRAVENOUS | Status: AC
  Administered 2014-07-31 (×2): 10 meq via INTRAVENOUS
  Filled 2014-07-31: qty 100

## 2014-07-31 MED ORDER — FERROUS SULFATE 325 (65 FE) MG PO TABS
325.0000 mg | ORAL_TABLET | Freq: Three times a day (TID) | ORAL | Status: DC
  Administered 2014-07-31 – 2014-08-01 (×4): 325 mg via ORAL
  Filled 2014-07-31 (×4): qty 1

## 2014-07-31 MED ORDER — CYCLOBENZAPRINE HCL 10 MG PO TABS: 10.0000 mg | ORAL_TABLET | Freq: Three times a day (TID) | ORAL | Status: DC | PRN

## 2014-07-31 MED ORDER — SODIUM CHLORIDE 0.9 % IJ SOLN
3.0000 mL | Freq: Two times a day (BID) | INTRAMUSCULAR | Status: DC
  Administered 2014-07-31 (×2): 3 mL via INTRAVENOUS

## 2014-07-31 MED ORDER — CARVEDILOL 3.125 MG PO TABS
6.2500 mg | ORAL_TABLET | Freq: Two times a day (BID) | ORAL | Status: DC
  Administered 2014-07-31 – 2014-08-01 (×3): 6.25 mg via ORAL
  Filled 2014-07-31 (×3): qty 2

## 2014-07-31 MED ORDER — ENOXAPARIN SODIUM 40 MG/0.4ML ~~LOC~~ SOLN
40.0000 mg | SUBCUTANEOUS | Status: DC
  Administered 2014-07-31 – 2014-08-01 (×3): 40 mg via SUBCUTANEOUS
  Filled 2014-07-31 (×3): qty 0.4

## 2014-07-31 MED ORDER — IVABRADINE HCL 5 MG PO TABS
5.0000 mg | ORAL_TABLET | Freq: Two times a day (BID) | ORAL | Status: DC
  Administered 2014-07-31: 5 mg via ORAL

## 2014-07-31 MED ORDER — AMIODARONE HCL 200 MG PO TABS
200.0000 mg | ORAL_TABLET | Freq: Two times a day (BID) | ORAL | Status: DC
  Administered 2014-07-31 – 2014-08-01 (×4): 200 mg via ORAL
  Filled 2014-07-31 (×4): qty 1

## 2014-07-31 MED ORDER — DIGOXIN 125 MCG PO TABS
0.1250 mg | ORAL_TABLET | Freq: Every day | ORAL | Status: DC
  Administered 2014-07-31 – 2014-08-01 (×2): 0.125 mg via ORAL
  Filled 2014-07-31 (×2): qty 1

## 2014-07-31 MED ORDER — GABAPENTIN 300 MG PO CAPS
600.0000 mg | ORAL_CAPSULE | Freq: Four times a day (QID) | ORAL | Status: DC
  Administered 2014-07-31 – 2014-08-01 (×5): 600 mg via ORAL
  Filled 2014-07-31 (×5): qty 2

## 2014-07-31 MED ORDER — SODIUM CHLORIDE 0.9 % IJ SOLN: 3.0000 mL | INTRAMUSCULAR | Status: DC | PRN

## 2014-07-31 MED ORDER — PANTOPRAZOLE SODIUM 40 MG PO TBEC
40.0000 mg | DELAYED_RELEASE_TABLET | Freq: Every day | ORAL | Status: DC
  Administered 2014-07-31 – 2014-08-01 (×2): 40 mg via ORAL
  Filled 2014-07-31 (×2): qty 1

## 2014-07-31 MED ORDER — ALBUTEROL SULFATE (2.5 MG/3ML) 0.083% IN NEBU
2.5000 mg | INHALATION_SOLUTION | Freq: Four times a day (QID) | RESPIRATORY_TRACT | Status: DC
  Administered 2014-07-31 – 2014-08-01 (×4): 2.5 mg via RESPIRATORY_TRACT
  Filled 2014-07-31 (×5): qty 3

## 2014-07-31 NOTE — H&P (Signed)
PCP:   [name], MD   Chief Complaint:  Chest pain  HPI: 48 year old female who   has a past medical history of CHF (congestive heart failure); Nonischemic cardiomyopathy; Mitral regurgitation; S/P cardiac catheterization; LBBB (left bundle branch block); HTN (hypertension); Asthma; IBS (irritable bowel syndrome); Morbid obesity; GERD (gastroesophageal reflux disease); IDA (iron deficiency anemia); AVM (arteriovenous malformation); Anemia, chronic disease; Peripheral neuropathy; OSA (obstructive sleep apnea); GI bleed (06/2009); Dyspnea on exertion; and Diabetes mellitus without complication. Patient has history of nonischemic cardio myopathy, status post biventricular ICD implant, patient was recently seen by a cardiologist as she had 3 ICD shocks in past 2 weeks. During first 2 episodes  she had passed out. Patient was recently started on amiodarone.  Today patient came to the ED after she started having chest pain, which was burning in character and radiated to both shoulders. She also had associated sweating. Had nausea but no vomiting. Patient tried to use the C Pap machine, which did not help the chest pain so she came to the hospital. At this time patient is chest pain-free, she denies fever, cough no dyspnea on exertion.  Allergies:   Allergies  Allergen Reactions  . Cymbalta [Duloxetine Hcl] Other (See Comments)    "Spontaneous type behavior"  . Lyrica [Pregabalin] Swelling  . Trazodone And Nefazodone Other (See Comments)    Nightmares       Past Medical History  Diagnosis Date  . CHF (congestive heart failure)     a. EF 30-35%, RV mildly dilated (difficult study 11/2013) b. RHC (12/2013): RA 38/37 (35), RV 90/28, PA 95/51 (71), PCWP 54, PA 40%, AO 96 %, CO/CI Fick 3.2/1.4, PVR 5.3   . Nonischemic cardiomyopathy     a. LHC (12/2013) normal coronary anatomy   . Mitral regurgitation     moderate to severe  . S/P cardiac catheterization   . LBBB (left bundle branch  block)     s/p [employer] CRT-D  . HTN (hypertension)   . Asthma   . IBS (irritable bowel syndrome)     with primarily constipation  . Morbid obesity   . GERD (gastroesophageal reflux disease)   . IDA (iron deficiency anemia)     2o TO SB AVMS, Hx parenteral iron Dr Mariel Sleet  . AVM (arteriovenous malformation)     Small bowel, s/p duble balloon enteroscopy/APC jejunum Dr Gwinda Passe [facility]) 01/23/2011  . Anemia, chronic disease   . Peripheral neuropathy   . OSA (obstructive sleep apnea)     on CPAP qhs  . GI bleed 06/2009    Hgb 7.5, ferritin 7, transfusion, iron IV  . Dyspnea on exertion     chronic  . Diabetes mellitus without complication     Past Surgical History  Procedure Laterality Date  . Crdt-implantation  4/07    [employer]. remote- yes  . Appendectomy    . Cholecystectomy      biliary dyskinesia  . Mastectomy  2003    left, partial  . Knee arthroscopy  2005    left  . Small bowel enteroscopy  MAY 2012 DBE [facility] DR. GILLIAM    SB AVMS s/p APC  . Colonoscopy  OCT 2010/SEP 2011    tortuous colon, 3 polyps-benign(2010),   . Upper gastrointestinal endoscopy  '08, '10, SEP 2011    mild antral gastritis (2010), negative SB bx (2010), incomlpete Schatzki's ring (2011)  . Small bowel enteroscopy  SEP 2011 [facility]    Fields-NO AVMS  .  Givens capsule study  NOV 2010     NO AVMS, normal  . Irrigation and debridement abscess  06/25/2012    Procedure: MINOR INCISION AND DRAINAGE OF ABSCESS;  Surgeon: [name], MD;  Location: [facility];  Service: General;  Laterality: [name];  Incision & Drainage of Infected Sebaceous Cyst on Chest  . Breast surgery    . Cardiac defibrillator placement      Prior to Admission medications   Medication Sig Start Date End Date Taking? Authorizing Provider  albuterol (PROVENTIL HFA;VENTOLIN HFA) 108 (90 BASE) MCG/ACT inhaler Inhale 2 puffs into the lungs every 6 (six) hours as needed for wheezing or shortness of breath.    Yes Historical Provider, MD  albuterol (PROVENTIL) (5 MG/ML) 0.5% nebulizer solution Take 0.5 mLs (2.5 mg total) by nebulization every 4 (four) hours as needed for wheezing or shortness of breath. 11/21/12  Yes [name], MD  amiodarone (PACERONE) 200 MG tablet Take 1 tablet (200 mg total) by mouth 2 (two) times daily. 07/28/14  Yes Rhonda G Barrett, PA-C  aspirin EC 81 MG EC tablet Take 1 tablet (81 mg total) by mouth daily. 12/16/13  Yes Aundria Rud, NP  carvedilol (COREG) 6.25 MG tablet Take 1 tablet (6.25 mg total) by mouth 2 (two) times daily with a meal. 06/15/14  Yes Dolores Patty, MD  cetirizine (ZYRTEC) 10 MG tablet Take 10 mg by mouth daily as needed for allergies.    Yes Historical Provider, MD  cyclobenzaprine (FLEXERIL) 10 MG tablet Take 10 mg by mouth 3 (three) times daily as needed for muscle spasms.    Yes Historical Provider, MD  digoxin (LANOXIN) 0.125 MG tablet Take 1 tablet (0.125 mg total) by mouth daily. 05/05/14  Yes Aundria Rud, NP  docusate sodium (STOOL SOFTENER) 100 MG capsule Take 100 mg by mouth daily.    Yes Historical Provider, MD  ferrous sulfate 325 (65 FE) MG tablet Take 325 mg by mouth 3 (three) times daily.   Yes Historical Provider, MD  gabapentin (NEURONTIN) 300 MG capsule Take 600 mg by mouth 4 (four) times daily.   Yes Historical Provider, MD  HYDROcodone-acetaminophen (NORCO) 10-325 MG per tablet Take 1 tablet by mouth every 4 (four) hours as needed for moderate pain.  11/11/13  Yes Historical Provider, MD  insulin aspart (NOVOLOG) 100 UNIT/ML injection Inject 0-20 Units into the skin 3 (three) times daily with meals. 06/28/12  Yes [name], MD  insulin detemir (LEVEMIR) 100 UNIT/ML injection Inject 100 Units into the skin at bedtime.    Yes Historical Provider, MD  ivabradine HCl (CORLANOR) 5 MG TABS tablet Take 1 tablet (5 mg total) by mouth 2 (two) times daily with a meal. 07/03/14  Yes Laurey Morale, MD  losartan (COZAAR) 50 MG  tablet Take 1 tablet (50 mg total) by mouth daily. 06/23/14  Yes Laurey Morale, MD  omeprazole (PRILOSEC) 40 MG capsule Take 1 capsule (40 mg total) by mouth daily. 03/27/13  Yes Marinus Maw, MD  potassium chloride SA (K-DUR,KLOR-CON) 20 MEQ tablet Take 1 tablet (20 mEq total) by mouth 2 (two) times daily. Or as instructed when to take extra. 05/05/14  Yes Aundria Rud, NP  spironolactone (ALDACTONE) 25 MG tablet Take 1 tablet (25 mg total) by mouth daily. 04/23/14  Yes Bevelyn Buckles Bensimhon, MD  temazepam (RESTORIL) 30 MG capsule Take 30 mg by mouth at bedtime.   Yes Historical Provider, MD  torsemide [facility])  20 MG tablet Take 3 tabs in the AM (60 mg) and take 2 tabs in the PM (40 mg) Patient taking differently: Take 40-60 mg by mouth 2 (two) times daily. Take 3 tabs in the AM (60 mg) and take 2 tabs in the PM (40 mg) 06/23/14  Yes Laurey Morale, MD  vitamin E 400 UNIT capsule Take 400 Units by mouth daily.   Yes Historical Provider, MD    Social History:  reports that she quit smoking about 4 years ago. Her smoking use included Cigarettes. She has a 10 pack-year smoking history. She has never used smokeless tobacco. She reports that she does not drink alcohol or use illicit drugs.  Family History  Problem Relation Age of Onset  . Colon cancer Neg Hx     no family Hx of polyps too, uncle  . Cervical cancer Mother   . Hypertension Mother   . Cancer Mother   . Heart disease Father   . Hypertension Father   . GER disease Father   . Heart attack Father   . Bleeding Disorder Father   . Diabetes Sister   . Heart disease Sister   . Hypertension Sister      All the positives are listed in BOLD  Review of Systems:  HEENT: Headache, blurred vision, runny nose, sore throat Neck: Hypothyroidism, hyperthyroidism,,lymphadenopathy Chest : Shortness of breath, history of COPD, Asthma Heart : Chest pain, history of coronary arterey disease GI:  Nausea, vomiting, diarrhea, constipation,  GERD GU: Dysuria, urgency, frequency of urination, hematuria Neuro: Stroke, seizures, syncope Psych: Depression, anxiety, hallucinations   Physical Exam: Blood pressure 108/84, pulse 68, temperature 98 F (36.7 C), temperature source Oral, resp. rate 16, height 5' (1.524 m), weight 122.018 kg (269 lb), last menstrual period 06/21/2012, SpO2 100 %. Constitutional:   Patient is a well-developed and well-nourished female* in no acute distress and cooperative with exam. Head: Normocephalic and atraumatic Mouth: Mucus membranes moist Eyes: PERRL, EOMI, conjunctivae normal Neck: Supple, No Thyromegaly Cardiovascular: RRR, S1 normal, S2 normal Pulmonary/Chest: CTAB, no wheezes, rales, or rhonchi Abdominal: Soft. Non-tender, non-distended, bowel sounds are normal, no masses, organomegaly, or guarding present.  Neurological: A&O x3, Strength is normal and symmetric bilaterally, cranial nerve II-XII are grossly intact, no focal motor deficit, sensory intact to light touch bilaterally.  Extremities : No Cyanosis, Clubbing, trace edema of the lower extremities  Labs on Admission:  Basic Metabolic Panel:  Recent Labs Lab 07/30/14 2204  NA 133*  K 3.1*  CL 87*  CO2 31  GLUCOSE 434*  BUN 11  CREATININE 0.91  CALCIUM 8.1*   Liver Function Tests: No results for input(s): AST, ALT, ALKPHOS, BILITOT, PROT, ALBUMIN in the last 168 hours. No results for input(s): LIPASE, AMYLASE in the last 168 hours. No results for input(s): AMMONIA in the last 168 hours. CBC:  Recent Labs Lab 07/30/14 2204  WBC 7.4  NEUTROABS 4.6  HGB 13.1  HCT 39.2  MCV 89.1  PLT 257   Cardiac Enzymes:  Recent Labs Lab 07/30/14 2204  TROPONINI <0.30    BNP (last 3 results)  Recent Labs  12/08/13 1028 12/23/13 1105  PROBNP 948.6* 248.1*   CBG: No results for input(s): GLUCAP in the last 168 hours.  Radiological Exams on Admission: Dg Chest Portable 1 View  07/30/2014   CLINICAL DATA:  Central  chest pain RIGHT burning, nausea, vomiting shortness of breath, and defibrillator discharge, former smoker, partial history of LEFT mastectomy, CHF, non ischemic  cardiomyopathy, hypertension, diabetes, asthma  EXAM: PORTABLE CHEST - 1 VIEW  COMPARISON:  Portable exam 2207 hr compared to 12/08/2013  FINDINGS: LEFT subclavian AICD leads project at RIGHT ventricle, RIGHT atrium, and coronary sinus.  Enlargement of cardiac silhouette with pulmonary vascular congestion.  Mediastinal contours normal.  Lungs grossly clear.  No pleural effusion or pneumothorax.  Bones unremarkable.  IMPRESSION: Enlargement of cardiac silhouette with pulmonary vascular congestion post AICD.  No definite acute infiltrate.   Electronically Signed   By: Ulyses SouthwardMark  Boles M.D.   On: 07/30/2014 22:18    EKG: Independently reviewed. Sinus rhythm, nonspecific ST changes.   Assessment/Plan Active Problems:   SYSTOLIC HEART FAILURE, CHRONIC   Automatic implantable cardioverter-defibrillator in situ   Diabetes mellitus   Chest pain  Chest pain Will admit the patient in stepdown unit, cycle cardiac enzymes. Will give morphine when necessary for pain.  Hypokalemia Fibula is potassium and check BMP in a.m.  Status post biventricular AICD- patient had 3 ICD shocks in last 2 weeks. Has been started on amiodarone by cardiology, will check serum magnesium level and replace potassium.  Diabetes mellitus This document sliding scale insulin with NovoLog Continue Levemir  DVT prophylaxis Lovenox  Code status: Full code  Family discussion: No family at bedside   Time Spent on Admission: 60 minutes  Jassmine Vandruff S [facility] Pager: [phone] 07/31/2014, 1:22 AM  If 7PM-7AM, please contact night-coverage  [email or web address]  Password [email or web address]

## 2014-07-31 NOTE — Plan of Care (Signed)
Problem: Phase I Progression Outcomes Goal: Hemodynamically stable Outcome: Not Progressing Goal: Voiding-avoid urinary catheter unless indicated Outcome: Progressing

## 2014-07-31 NOTE — Care Management Note (Signed)
    Page 1 of 1   07/31/2014     1:52:49 PM CARE MANAGEMENT NOTE 07/31/2014  Patient:  [name], [name]   Account Number:  [id number]  Date Initiated:  07/31/2014  Documentation initiated by:  Kathyrn Sheriff  Subjective/Objective Assessment:   Pt is from home with self care. Pt has Cpap through [facility]. Pt has no HH services or medication needs prior to admission.     Action/Plan:   Pt plans to discharge home over weekend. Pt has no CM needs identified at this time.   Anticipated DC Date:  08/01/2014   Anticipated DC Plan:  HOME/SELF CARE      DC Planning Services  CM consult      Choice offered to / List presented to:     DME arranged  CPAP      DME agency  [facility]        Status of service:  Completed, signed off Medicare Important Message given?  YES (If response is "NO", the following Medicare IM given date fields will be blank) Date Medicare IM given:  07/31/2014 Medicare IM given by:  Anibal Henderson Date Additional Medicare IM given:   Additional Medicare IM given by:    Discharge Disposition:  HOME/SELF CARE  Per UR Regulation:    If discussed at Long Length of Stay Meetings, dates discussed:    Comments:  07/31/2014 1300 Kathyrn Sheriff, RN, MSN, [facility]

## 2014-07-31 NOTE — Progress Notes (Signed)
Patient seen and examined. Admitted earlier today for CP, fatigue. Has a h/o NICM with EF 35-40%. Cath in April 2015 with normal CAD, BiV ICD. Has had 3 ICD shocks in the past 2 weeks, most recently on Monday. Went to see Dr. [name] on 11/17 and was advised admission, but she declined. Was started on PO amiodarone. Has ruled out for ACS. I suspect her current CP is muscle soreness from AICD firing. She has also been describing some pretty significant visual disturbances: sharp light shining thru her eye, a sensation of a ceiling fan spinning over her head. Suspect these disturbances may be a side effect of Corlenor that she was recently started on. Will defer to cardiology further management of this patient's chronic CHF, VT/VF.  Peggye Pitt, MD [facility] Pager: [phone]

## 2014-07-31 NOTE — Progress Notes (Addendum)
Inpatient Diabetes Program Recommendations  AACE/ADA: New Consensus Statement on Inpatient Glycemic Control (2013)  Target Ranges:  Prepandial:   less than 140 mg/dL      Peak postprandial:   less than 180 mg/dL (1-2 hours)      Critically ill patients:  140 - 180 mg/dL    Results for Barbara, [name] (MRN [id number]) as of 07/31/2014 14:06  Ref. Range 07/31/2014 02:00 07/31/2014 07:52 07/31/2014 11:23  Glucose-Capillary Latest Range: 70-99 mg/dL 024 (H) 097 (H) 353 (H)   Admitted with chest pain and fatigue.  Reason for Assessment; elevated CBG   Diabetes history: Type 2 Outpatient Diabetes medications: Novolog 0-20 units 3x/day with meals, Levemir 100 units/day Current orders for Inpatient glycemic control: Novolog 0-15 units tid with meals, Novolog 0-5 units qhs, Levemir 100units /day   May want to consider adding mealtime insulin Novolog 10 units tid with meals (half home dose) in addition to the correction insulin that is already ordered.   Please consider ordering an A1C to determine home blood sugar control.    Barbara Racer, RN, BA, MHA, CDE Diabetes Coordinator Inpatient Diabetes Program  [phone] (Team Pager) [phone] [name] Office) 07/31/2014 2:24 PM

## 2014-07-31 NOTE — Consult Note (Signed)
CARDIOLOGY CONSULT NOTE   Patient ID: [name] MRN: [id number] DOB/AGE: 48/07/1966 48 y.o.  Admit Date: 07/30/2014 Referring Physician: PTH-Hernandez Primary Physician: [name], MD Consulting Cardiologist: Prentice Docker MD Primary Cardiologist : Dina Rich MD Reason for Consultation: Chest Pain   Clinical Summary [name] is a 48 y.o.female morbidly obese with known hx of NICM, ICD pacemaker insitu, with ICD shocks X 3 per pacemaker interrogation completed last week. She was seen by Dr. [name] on Monday of this week and was suggested for admission for IV amiodarone loading, but refused. Instead, she was placed on amiodarone 200 mg BID on 07/28/2014.   She states that she had been feeling tired and weak for several weeks. No further episodes of ICD discharge since last week. She awoke yesterday am and began to have severe heartburn lasting all day. No associated DOE, diaphoresis, or worsening weakness, non-radiating. After several hours of unrelenting chest burning, she called EMS and was brought to ER.   In ER she was found to be hypotensive, 106/79, HR 69, and resp 12. O2 Sat 96%. Glucose was 434, Potassium 3.1, sodium 133. Dig level  She was treated with potassium po, insuline, and provided with digoxin po. 0.6. CXR demonstrated pulmonary vascular congestion. Troponin negative X 2.   She states that her home blood glucose has been running in the 200- or higher for 3 weeks.   Had been on sliding scale insulin but had stopped using it as her BG had been regulated on long acting insulin. States she has been starting back on it.  Denies dietary non-compliance or medical non-compliance.   Other history is Diabetes, OSA on CPAP, Hypertension, IBS and GERD.   Allergies  Allergen Reactions  . Cymbalta [Duloxetine Hcl] Other (See Comments)    "Spontaneous type behavior"  . Lyrica [Pregabalin] Swelling  . Trazodone And Nefazodone Other (See Comments)   Nightmares     Medications Scheduled Medications: . amiodarone  200 mg Oral BID  . aspirin EC  81 mg Oral Daily  . carvedilol  6.25 mg Oral BID WC  . digoxin  0.125 mg Oral Daily  . enoxaparin (LOVENOX) injection  40 mg Subcutaneous Q24H  . ferrous sulfate  325 mg Oral TID  . gabapentin  600 mg Oral QID  . insulin aspart  0-15 Units Subcutaneous TID WC  . insulin aspart  0-5 Units Subcutaneous QHS  . insulin detemir  100 Units Subcutaneous QHS  . ivabradine HCl  2.5 mg Oral BID WC  . losartan  50 mg Oral Daily  . pantoprazole  40 mg Oral Daily  . [START ON 08/01/2014] potassium chloride SA  20 mEq Oral BID  . sodium chloride  3 mL Intravenous Q12H  . spironolactone  25 mg Oral Daily  . temazepam  30 mg Oral QHS  . torsemide  40 mg Oral QHS  . torsemide  60 mg Oral Daily      PRN Medications: sodium chloride, albuterol, cyclobenzaprine, morphine injection, ondansetron **OR** ondansetron (ZOFRAN) IV, sodium chloride   Past Medical History  Diagnosis Date  . CHF (congestive heart failure)     a. EF 30-35%, RV mildly dilated (difficult study 11/2013) b. RHC (12/2013): RA 38/37 (35), RV 90/28, PA 95/51 (71), PCWP 54, PA 40%, AO 96 %, CO/CI Fick 3.2/1.4, PVR 5.3   . Nonischemic cardiomyopathy     a. LHC (12/2013) normal coronary anatomy   . Mitral regurgitation     moderate to severe  .  S/P cardiac catheterization   . LBBB (left bundle branch block)     s/p [employer] CRT-D  . HTN (hypertension)   . Asthma   . IBS (irritable bowel syndrome)     with primarily constipation  . Morbid obesity   . GERD (gastroesophageal reflux disease)   . IDA (iron deficiency anemia)     2o TO SB AVMS, Hx parenteral iron Dr Mariel SleetNeijstrom  . AVM (arteriovenous malformation)     Small bowel, s/p duble balloon enteroscopy/APC jejunum Dr Gwinda PasseGilliam [facility] 01/23/2011  . Anemia, chronic disease   . Peripheral neuropathy   . OSA (obstructive sleep apnea)     on CPAP qhs  . GI bleed 06/2009     Hgb 7.5, ferritin 7, transfusion, iron IV  . Dyspnea on exertion     chronic  . Diabetes mellitus without complication     Past Surgical History  Procedure Laterality Date  . Crdt-implantation  4/07    [employer]. remote- yes  . Appendectomy    . Cholecystectomy      biliary dyskinesia  . Mastectomy  2003    left, partial  . Knee arthroscopy  2005    left  . Small bowel enteroscopy  MAY [facility] DR. GILLIAM    SB AVMS s/p APC  . Colonoscopy  OCT 2010/SEP 2011    tortuous colon, 3 polyps-benign(2010),   . Upper gastrointestinal endoscopy  '08, '10, SEP 2011    mild antral gastritis (2010), negative SB bx (2010), incomlpete Schatzki's ring (2011)  . Small bowel enteroscopy  SEP 2011 [facility]    Fields-NO AVMS  . Givens capsule study  NOV 2010     NO AVMS, normal  . Irrigation and debridement abscess  06/25/2012    Procedure: MINOR INCISION AND DRAINAGE OF ABSCESS;  Surgeon: Marlane HatcherWilliam S Bradford, MD;  Location: [facility];  Service: General;  Laterality: [name];  Incision & Drainage of Infected Sebaceous Cyst on Chest  . Breast surgery    . Cardiac defibrillator placement      Family History  Problem Relation Age of Onset  . Colon cancer Neg Hx     no family Hx of polyps too, uncle  . Cervical cancer Mother   . Hypertension Mother   . Cancer Mother   . Heart disease Father   . Hypertension Father   . GER disease Father   . Heart attack Father   . Bleeding Disorder Father   . Diabetes Sister   . Heart disease Sister   . Hypertension Sister     Social History [name] reports that she quit smoking about 4 years ago. Her smoking use included Cigarettes. She has a 10 pack-year smoking history. She has quit using smokeless tobacco. [name] reports that she does not drink alcohol.  Review of Systems Complete review of systems are found to be negative unless outlined in H&P above.  Physical Examination Blood pressure 100/60, pulse 69, temperature 98.8 F (37.1  C), temperature source Oral, resp. rate 22, height 5\' 5"  (1.651 m), weight 266 lb 1.5 oz (120.7 kg), last menstrual period 06/21/2012, SpO2 100 %.  Intake/Output Summary (Last 24 hours) at 07/31/14 0959 Last data filed at 07/31/14 0600  Gross per 24 hour  Intake    480 ml  Output      0 ml  Net    480 ml    Telemetry: NSR  ZOX:[id number]:Resting comfortably no acute distress HEENT: Conjunctiva and lids normal,  oropharynx clear with moist mucosa. Neck: Supple, no elevated JVP or carotid bruits, no thyromegaly. Lungs: Clear to auscultation, nonlabored breathing at rest. Cardiac: Regular rate and rhythm, distant heart sounds, soft 1/6 systolic murmur, no pericardial rub. Abdomen: Soft, nontender, no hepatomegaly, bowel sounds present, no guarding or rebound. Extremities: No pitting edema, distal pulses 2+. Skin: Warm and dry. Musculoskeletal: No kyphosis. Neuropsychiatric: Alert and oriented x3, affect grossly appropriate.  Prior Cardiac Testing/Procedures  1. PaceHart Device Check  Pt shocked 3x since 07/20/14---pt intentionally unplugged Latitude, remote received 07/27/14 once plugged in. Pt unaware of shock on 07/27/14. Thresholds and sensing consistent with previous device measurements. Lead impedance trends stable over time. Patient bi-ventricularly pacing 97% of the time. Device programmed with appropriate safety margins. No changes made this session. Estimated longevity 5.44yrs. Amiodarone 200mg  BID prescribed. ROV w/ GT in 4wks.   2. Echocardiogram: 12/09/2013 Study data: Technically difficult study. Consider contrast study. - Left ventricle: Assessment of LV systolic function is limited by poor visualization. Grossly LVEF appears mildly to moderatelyreduced, approximately 35-40%. Consider contrast study fore more accurate evaluation. Findings consistent with left ventricular diastolic dysfunction, indeterminant grade. There is evidence of significantly eleved LA pressure  (E/e' 19). - Aortic valve: Mildly calcified annulus. Mildly thickened leaflets. Valve area: 2.47cm^2(VTI). Valve area: 2.41cm^2 (Vmax). - Mitral valve: Mildly calcified annulus. Mildly thickened leaflets . Mild to moderate regurgitation. - Left atrium: The atrium was severely dilated. - Right ventricle: The cavity size was mildly dilated. - Pulmonary arteries: PA pressure is at least borderline elevated at 35 mmHg. Unable to estimate RA pressure, the IVC is poorly visualized. - Recommendations: Consider contrast echo evaluation.  Cardiac Cath: 12/12/2013 Coronary angiography: Coronary dominance: left Left mainstem: Very short shared ostium Left anterior descending (LAD): Normal Left circumflex (LCx): large dominant vessel. Normal. Right coronary artery (RCA): nondominant, normal. Left ventriculography: not done due to high filling pressures.  Final Conclusions:  1. Normal coronary anatomy 2. Severe pulmonary HTN with markedly elevated LV filling pressures.  Recommendations: Transfer to ICU. IV diruesis. Will hold beta blocker for now. IV inotropic therapy. Will consult advanced heart failure service.  Lab Results  Basic Metabolic Panel:  Recent Labs Lab 07/30/14 2204 07/31/14 0736  NA 133* 138  K 3.1* 3.4*  CL 87* 93*  CO2 31 34*  GLUCOSE 434* 249*  BUN 11 10  CREATININE 0.91 0.91  CALCIUM 8.1* 7.9*    Liver Function Tests:  Recent Labs Lab 07/31/14 0736  AST 13  ALT 6  ALKPHOS 94  BILITOT 0.4  PROT 7.7  ALBUMIN 3.1*    CBC:  Recent Labs Lab 07/30/14 2204 07/31/14 0736  WBC 7.4 5.9  NEUTROABS 4.6  --   HGB 13.1 12.7  HCT 39.2 38.2  MCV 89.1 90.1  PLT 257 264    Cardiac Enzymes:  Recent Labs Lab 07/30/14 2204 07/31/14 0158 07/31/14 0736  TROPONINI <0.30 <0.30 <0.30    Radiology: Dg Chest Portable 1 View  07/30/2014   CLINICAL DATA:  Central chest pain RIGHT burning, nausea, vomiting shortness of breath, and defibrillator  discharge, former smoker, partial history of LEFT mastectomy, CHF, non ischemic cardiomyopathy, hypertension, diabetes, asthma  EXAM: PORTABLE CHEST - 1 VIEW  COMPARISON:  Portable exam 2207 hr compared to 12/08/2013  FINDINGS: LEFT subclavian AICD leads project at RIGHT ventricle, RIGHT atrium, and coronary sinus.  Enlargement of cardiac silhouette with pulmonary vascular congestion.  Mediastinal contours normal.  Lungs grossly clear.  No pleural effusion or pneumothorax.  Bones unremarkable.  IMPRESSION: Enlargement of cardiac silhouette with pulmonary vascular congestion post AICD.  No definite acute infiltrate.   Electronically Signed   By: Ulyses Southward M.D.   On: 07/30/2014 22:18     ECG: NSR with RBBB. LVH   Impression and Recommendations  1.Atyipical Chest Pain: Described as constant burning, lasting all day with generalized fatigue that has been ongoing. She states that she did not have nausea or worsening dyspnea other than baseline. Do not think this is cardiology related as she was hypokalemic and hyperglycemic on admission with resolution of symptoms with potassium replacement and BG treatment. Troponin and EKG are negative for ACS. Cardiac cath in April of 2015 was negative for CAD.   2. NICM: EF of  35%. Mild pulmonary vascular congestion on CXR, but lung sounds are clear this am. She is on torsemide 60 mg in am, and 40 mg in pm, along with spironolactone 25 mg daily. Dig level was low on admission. She adamantly denies medical non-compliance.   3. ICD in situ: [employer]: Last interrogated 07/28/2014, with results above. Has been shocked 3 times since 07/20/14-07/27/2014. She has not had any symptoms since starting on amiodarone on 07/28/2014. Her symptoms with shocks are syncope and collapse, awakening feeling weak. Does not feel discharges. She has not had episode since 07/27/2014. Continue amiodarone 200 mg BID along with carvedilol, and digoxin. Does not need ICD interrogated at  this time.   4. OSA: Chronic dyspnea, with use of CPAP at night. She has chronic DOE, likely due to morbid obesity and hypoventilation syndrome. On steroid inhalers, likely contributing to higher BG.   5. Anemia: No evidence of anemia on admission.   6. GERD: Consider GI consultation. May have some diabetic gastroparesis as well. On PPI. Has had cholecystectomy.   7. Hypokalemia: Potassium being repleted. She is on ARB and spironolactone, along with potassium replacement at home. Uncertain why she was so low on admission. She denies non-compliance.       Signed: Bettey Mare. Lawrence NP [facility]  07/31/2014, 9:59 AM Co-Sign MD

## 2014-07-31 NOTE — Care Management Utilization Note (Signed)
UR review complete.  

## 2014-08-01 LAB — GLUCOSE, CAPILLARY
GLUCOSE-CAPILLARY: 210 mg/dL — AB (ref 70–99)
Glucose-Capillary: 256 mg/dL — ABNORMAL HIGH (ref 70–99)

## 2014-08-01 MED ORDER — IVABRADINE HCL 5 MG PO TABS: 2.5000 mg | ORAL_TABLET | Freq: Two times a day (BID) | ORAL | Status: DC

## 2014-08-01 MED ORDER — CETYLPYRIDINIUM CHLORIDE 0.05 % MT LIQD
7.0000 mL | Freq: Two times a day (BID) | OROMUCOSAL | Status: DC
Start: 2014-08-01 — End: 2014-08-01
  Administered 2014-08-01: 7 mL via OROMUCOSAL

## 2014-08-01 NOTE — Plan of Care (Signed)
Problem: Phase I Progression Outcomes Goal: Hemodynamically stable Outcome: Progressing Vital signs stable without medications currently Goal: Anginal pain relieved Outcome: Progressing Patient states she has discomfort at times but no pain like previously Goal: MD aware of Cardiac Marker results Outcome: Completed/Met Date Met:  08/01/14 All markers negative Goal: Voiding-avoid urinary catheter unless indicated Outcome: Progressing Voiding using BSC without difficulty

## 2014-08-01 NOTE — Discharge Summary (Signed)
Physician Discharge Summary  [name]:[id number] DOB: Feb 20, 1966 DOA: 07/30/2014  PCP: [name], MD  Admit date: 07/30/2014 Discharge date: 08/01/2014  Time spent: 45 minutes  Recommendations for Outpatient Follow-up:  -Will be discharged home today. -Advised to follow up with Dr. [name] as previously scheduled.   Discharge Diagnoses:  Active Problems:   SYSTOLIC HEART FAILURE, CHRONIC   Automatic implantable cardioverter-defibrillator in situ   Diabetes mellitus   Chest pain   Chest pain at rest   Acute on chronic systolic CHF (congestive heart failure)   Hyperglycemia   Severe left ventricular systolic dysfunction   Hypokalemia   VF (ventricular fibrillation)   VT (ventricular tachycardia)   Essential hypertension   Discharge Condition: Stable and improved  Filed Weights   07/31/14 0100 07/31/14 0145 08/01/14 0700  Weight: 120.7 kg (266 lb 1.5 oz) 120.7 kg (266 lb 1.5 oz) 121.9 kg (268 lb 11.9 oz)    History of present illness:  Patient is a 48 y/o woman who has a history of nonischemic cardio myopathy, status post biventricular ICD implant, patient was recently seen by a cardiologist as she had 3 ICD shocks in past 2 weeks. During first 2 episodes she had passed out. Patient was recently started on amiodarone.  Today patient came to the ED after she started having chest pain, which was burning in character and radiated to both shoulders. She also had associated sweating. Had nausea but no vomiting. Patient tried to use the C Pap machine, which did not help the chest pain so she came to the hospital. At this time patient is chest pain-free, she denies fever, cough no dyspnea on exertion.  Hospital Course:   Chest Pain -Ruled out for ACS. -Normal coronaries per cath in April/15. -Suspect local muscle soreness from 3 recent ICD shocks in the past -Has been seen by cardiology. No further workup/med changes anticipated.  Chronic Systolic  CHF/VT/VFib/AICD -Continue home meds and follow up as scheduled with cards.  Procedures:  None   Consultations:  Cardiology  Discharge Instructions  Discharge Instructions    Diet - low sodium heart healthy    Complete by:  As directed      Diet Carb Modified    Complete by:  As directed      Increase activity slowly    Complete by:  As directed             Medication List    TAKE these medications        albuterol 108 (90 BASE) MCG/ACT inhaler  Commonly known as:  PROVENTIL HFA;VENTOLIN HFA  Inhale 2 puffs into the lungs every 6 (six) hours as needed for wheezing or shortness of breath.     albuterol (5 MG/ML) 0.5% nebulizer solution  Commonly known as:  PROVENTIL  Take 0.5 mLs (2.5 mg total) by nebulization every 4 (four) hours as needed for wheezing or shortness of breath.     amiodarone 200 MG tablet  Commonly known as:  PACERONE  Take 1 tablet (200 mg total) by mouth 2 (two) times daily.     aspirin 81 MG EC tablet  Take 1 tablet (81 mg total) by mouth daily.     carvedilol 6.25 MG tablet  Commonly known as:  COREG  Take 1 tablet (6.25 mg total) by mouth 2 (two) times daily with a meal.     cetirizine 10 MG tablet  Commonly known as:  ZYRTEC  Take 10 mg by mouth daily  as needed for allergies.     cyclobenzaprine 10 MG tablet  Commonly known as:  FLEXERIL  Take 10 mg by mouth 3 (three) times daily as needed for muscle spasms.     digoxin 0.125 MG tablet  Commonly known as:  LANOXIN  Take 1 tablet (0.125 mg total) by mouth daily.     ferrous sulfate 325 (65 FE) MG tablet  Take 325 mg by mouth 3 (three) times daily.     gabapentin 300 MG capsule  Commonly known as:  NEURONTIN  Take 600 mg by mouth 4 (four) times daily.     HYDROcodone-acetaminophen 10-325 MG per tablet  Commonly known as:  NORCO  Take 1 tablet by mouth every 4 (four) hours as needed for moderate pain.     insulin aspart 100 UNIT/ML injection  Commonly known as:  novoLOG    Inject 0-20 Units into the skin 3 (three) times daily with meals.     insulin detemir 100 UNIT/ML injection  Commonly known as:  LEVEMIR  Inject 100 Units into the skin at bedtime.     ivabradine HCl 5 MG Tabs tablet  Commonly known as:  CORLANOR  Take 0.5 tablets (2.5 mg total) by mouth 2 (two) times daily with a meal.     losartan 50 MG tablet  Commonly known as:  COZAAR  Take 1 tablet (50 mg total) by mouth daily.     omeprazole 40 MG capsule  Commonly known as:  PRILOSEC  Take 1 capsule (40 mg total) by mouth daily.     potassium chloride SA 20 MEQ tablet  Commonly known as:  K-DUR,KLOR-CON  Take 1 tablet (20 mEq total) by mouth 2 (two) times daily. Or as instructed when to take extra.     spironolactone 25 MG tablet  Commonly known as:  ALDACTONE  Take 1 tablet (25 mg total) by mouth daily.     STOOL SOFTENER 100 MG capsule  Generic drug:  docusate sodium  Take 100 mg by mouth daily.     temazepam 30 MG capsule  Commonly known as:  RESTORIL  Take 30 mg by mouth at bedtime.     torsemide 20 MG tablet  Commonly known as:  DEMADEX  Take 3 tabs in the AM (60 mg) and take 2 tabs in the PM (40 mg)     vitamin E 400 UNIT capsule  Take 400 Units by mouth daily.       Allergies  Allergen Reactions  . Cymbalta [Duloxetine Hcl] Other (See Comments)    "Spontaneous type behavior"  . Lyrica [Pregabalin] Swelling  . Trazodone And Nefazodone Other (See Comments)    Nightmares        Follow-up Information    Follow up with Barbara Bunting, MD.   Specialty:  Cardiology   Why:  as scheduled previously by his office   Contact information:   [address] [place] [address] [phone]        The results of significant diagnostics from this hospitalization (including imaging, microbiology, ancillary and laboratory) are listed below for reference.    Significant Diagnostic Studies: Dg Chest Portable 1 View  07/30/2014   CLINICAL DATA:  Central chest pain RIGHT  burning, nausea, vomiting shortness of breath, and defibrillator discharge, former smoker, partial history of LEFT mastectomy, CHF, non ischemic cardiomyopathy, hypertension, diabetes, asthma  EXAM: PORTABLE CHEST - 1 VIEW  COMPARISON:  Portable exam 2207 hr compared to 12/08/2013  FINDINGS: LEFT subclavian AICD  leads project at RIGHT ventricle, RIGHT atrium, and coronary sinus.  Enlargement of cardiac silhouette with pulmonary vascular congestion.  Mediastinal contours normal.  Lungs grossly clear.  No pleural effusion or pneumothorax.  Bones unremarkable.  IMPRESSION: Enlargement of cardiac silhouette with pulmonary vascular congestion post AICD.  No definite acute infiltrate.   Electronically Signed   By: Ulyses SouthwardMark  Boles M.D.   On: 07/30/2014 22:18    Microbiology: Recent Results (from the past 240 hour(s))  MRSA PCR Screening     Status: None   Collection Time: 07/31/14  1:35 AM  Result Value Ref Range Status   MRSA by PCR NEGATIVE NEGATIVE Final    Comment:        The GeneXpert MRSA Assay (FDA approved for NASAL specimens only), is one component of a comprehensive MRSA colonization surveillance program. It is not intended to diagnose MRSA infection nor to guide or monitor treatment for MRSA infections.      Labs: Basic Metabolic Panel:  Recent Labs Lab 07/30/14 2204 07/31/14 0736 07/31/14 1335  NA 133* 138 139  K 3.1* 3.4* 3.4*  CL 87* 93* 94*  CO2 31 34* 34*  GLUCOSE 434* 249* 248*  BUN 11 10 10   CREATININE 0.91 0.91 0.90  CALCIUM 8.1* 7.9* 8.3*  MG  --   --  1.4*   Liver Function Tests:  Recent Labs Lab 07/31/14 0736  AST 13  ALT 6  ALKPHOS 94  BILITOT 0.4  PROT 7.7  ALBUMIN 3.1*   No results for input(s): LIPASE, AMYLASE in the last 168 hours. No results for input(s): AMMONIA in the last 168 hours. CBC:  Recent Labs Lab 07/30/14 2204 07/31/14 0736  WBC 7.4 5.9  NEUTROABS 4.6  --   HGB 13.1 12.7  HCT 39.2 38.2  MCV 89.1 90.1  PLT 257 264   Cardiac  Enzymes:  Recent Labs Lab 07/30/14 2204 07/31/14 0158 07/31/14 0736 07/31/14 1335  TROPONINI <0.30 <0.30 <0.30 <0.30   BNP: BNP (last 3 results)  Recent Labs  12/08/13 1028 12/23/13 1105  PROBNP 948.6* 248.1*   CBG:  Recent Labs Lab 07/31/14 1123 07/31/14 1624 07/31/14 2111 08/01/14 0810 08/01/14 1134  GLUCAP 271* 224* 240* 210* 256*       Signed:  HERNANDEZ ACOSTA,ESTELA  [facility] Pager: [phone] 08/01/2014, 12:51 PM

## 2014-08-01 NOTE — Progress Notes (Signed)
PT ALERT AND ORIENTED. DENIES ANY CHEST PAIN OR DISCOMFORT. DISCHARGED HOME W/ SINIFICANT OTHER.

## 2014-08-04 ENCOUNTER — Emergency Department (HOSPITAL_COMMUNITY): Payer: Medicare Other

## 2014-08-04 ENCOUNTER — Telehealth: Payer: Self-pay | Admitting: Cardiology

## 2014-08-04 ENCOUNTER — Emergency Department (HOSPITAL_COMMUNITY)
Admission: EM | Admit: 2014-08-04 | Discharge: 2014-08-05 | Disposition: A | Discharge status: Home / Self Care | Payer: Medicare Other | Source: Home / Self Care | Attending: Emergency Medicine | Admitting: Emergency Medicine

## 2014-08-04 ENCOUNTER — Encounter (HOSPITAL_COMMUNITY): Payer: Self-pay | Admitting: Emergency Medicine

## 2014-08-04 DIAGNOSIS — I5043 Acute on chronic combined systolic (congestive) and diastolic (congestive) heart failure: Secondary | ICD-10-CM | POA: Diagnosis present

## 2014-08-04 DIAGNOSIS — I10 Essential (primary) hypertension: Secondary | ICD-10-CM | POA: Insufficient documentation

## 2014-08-04 DIAGNOSIS — G629 Polyneuropathy, unspecified: Secondary | ICD-10-CM

## 2014-08-04 DIAGNOSIS — Z9981 Dependence on supplemental oxygen: Secondary | ICD-10-CM | POA: Insufficient documentation

## 2014-08-04 DIAGNOSIS — S93402A Sprain of unspecified ligament of left ankle, initial encounter: Secondary | ICD-10-CM | POA: Diagnosis present

## 2014-08-04 DIAGNOSIS — R404 Transient alteration of awareness: Secondary | ICD-10-CM | POA: Diagnosis not present

## 2014-08-04 DIAGNOSIS — Z87891 Personal history of nicotine dependence: Secondary | ICD-10-CM | POA: Insufficient documentation

## 2014-08-04 DIAGNOSIS — Z6841 Body Mass Index (BMI) 40.0 and over, adult: Secondary | ICD-10-CM

## 2014-08-04 DIAGNOSIS — Z79899 Other long term (current) drug therapy: Secondary | ICD-10-CM | POA: Insufficient documentation

## 2014-08-04 DIAGNOSIS — W19XXXA Unspecified fall, initial encounter: Secondary | ICD-10-CM | POA: Diagnosis present

## 2014-08-04 DIAGNOSIS — E119 Type 2 diabetes mellitus without complications: Secondary | ICD-10-CM | POA: Insufficient documentation

## 2014-08-04 DIAGNOSIS — Z95810 Presence of automatic (implantable) cardiac defibrillator: Secondary | ICD-10-CM | POA: Insufficient documentation

## 2014-08-04 DIAGNOSIS — M109 Gout, unspecified: Secondary | ICD-10-CM | POA: Diagnosis present

## 2014-08-04 DIAGNOSIS — R0602 Shortness of breath: Secondary | ICD-10-CM

## 2014-08-04 DIAGNOSIS — K219 Gastro-esophageal reflux disease without esophagitis: Secondary | ICD-10-CM | POA: Insufficient documentation

## 2014-08-04 DIAGNOSIS — E871 Hypo-osmolality and hyponatremia: Secondary | ICD-10-CM | POA: Diagnosis present

## 2014-08-04 DIAGNOSIS — Z9889 Other specified postprocedural states: Secondary | ICD-10-CM

## 2014-08-04 DIAGNOSIS — I509 Heart failure, unspecified: Secondary | ICD-10-CM | POA: Insufficient documentation

## 2014-08-04 DIAGNOSIS — F4310 Post-traumatic stress disorder, unspecified: Secondary | ICD-10-CM | POA: Diagnosis present

## 2014-08-04 DIAGNOSIS — I4901 Ventricular fibrillation: Secondary | ICD-10-CM | POA: Diagnosis not present

## 2014-08-04 DIAGNOSIS — I472 Ventricular tachycardia: Secondary | ICD-10-CM | POA: Diagnosis present

## 2014-08-04 DIAGNOSIS — I428 Other cardiomyopathies: Secondary | ICD-10-CM | POA: Diagnosis present

## 2014-08-04 DIAGNOSIS — S8392XA Sprain of unspecified site of left knee, initial encounter: Secondary | ICD-10-CM | POA: Diagnosis present

## 2014-08-04 DIAGNOSIS — G4733 Obstructive sleep apnea (adult) (pediatric): Secondary | ICD-10-CM | POA: Diagnosis present

## 2014-08-04 DIAGNOSIS — I493 Ventricular premature depolarization: Secondary | ICD-10-CM | POA: Diagnosis present

## 2014-08-04 DIAGNOSIS — J45901 Unspecified asthma with (acute) exacerbation: Secondary | ICD-10-CM | POA: Insufficient documentation

## 2014-08-04 DIAGNOSIS — D509 Iron deficiency anemia, unspecified: Secondary | ICD-10-CM | POA: Insufficient documentation

## 2014-08-04 DIAGNOSIS — Z794 Long term (current) use of insulin: Secondary | ICD-10-CM | POA: Insufficient documentation

## 2014-08-04 DIAGNOSIS — Q2730 Arteriovenous malformation, site unspecified: Secondary | ICD-10-CM

## 2014-08-04 DIAGNOSIS — Z7982 Long term (current) use of aspirin: Secondary | ICD-10-CM | POA: Insufficient documentation

## 2014-08-04 DIAGNOSIS — E876 Hypokalemia: Secondary | ICD-10-CM | POA: Diagnosis present

## 2014-08-04 DIAGNOSIS — R5381 Other malaise: Secondary | ICD-10-CM | POA: Diagnosis present

## 2014-08-04 DIAGNOSIS — E042 Nontoxic multinodular goiter: Secondary | ICD-10-CM | POA: Diagnosis present

## 2014-08-04 NOTE — Telephone Encounter (Signed)
Patient called stating that she has been having increased SOB for the past day along with cough productive of white sputum that is thick.  SHe denies any chest pain or LE edema but her weight is up to 172lbs (dry weight 165lbs) and she has swelling in her abdomen.  She was just discharged from [facility] last Saturday.  I have instructed her to go back to [facility] ER now to be evaluated.

## 2014-08-04 NOTE — ED Notes (Signed)
Pt c/o sob all day and states she has taken 6 torsemide tablets today.

## 2014-08-05 ENCOUNTER — Encounter (HOSPITAL_COMMUNITY): Payer: Self-pay | Admitting: *Deleted

## 2014-08-05 ENCOUNTER — Inpatient Hospital Stay (HOSPITAL_COMMUNITY)
Admission: EM | Admit: 2014-08-05 | Discharge: 2014-08-14 | DRG: 308 | Disposition: A | Discharge status: Home Health | Payer: Medicare Other | Attending: Internal Medicine | Admitting: Internal Medicine

## 2014-08-05 ENCOUNTER — Telehealth: Payer: Self-pay | Admitting: Internal Medicine

## 2014-08-05 ENCOUNTER — Emergency Department (HOSPITAL_COMMUNITY): Payer: Medicare Other

## 2014-08-05 ENCOUNTER — Other Ambulatory Visit: Payer: Self-pay

## 2014-08-05 DIAGNOSIS — Z4502 Encounter for adjustment and management of automatic implantable cardiac defibrillator: Secondary | ICD-10-CM

## 2014-08-05 DIAGNOSIS — I472 Ventricular tachycardia, unspecified: Secondary | ICD-10-CM

## 2014-08-05 DIAGNOSIS — W102XXA Fall (on)(from) incline, initial encounter: Secondary | ICD-10-CM

## 2014-08-05 DIAGNOSIS — M1000 Idiopathic gout, unspecified site: Secondary | ICD-10-CM | POA: Insufficient documentation

## 2014-08-05 DIAGNOSIS — Z9989 Dependence on other enabling machines and devices: Secondary | ICD-10-CM

## 2014-08-05 DIAGNOSIS — R4020 Unspecified coma: Secondary | ICD-10-CM

## 2014-08-05 DIAGNOSIS — I4729 Other ventricular tachycardia: Secondary | ICD-10-CM

## 2014-08-05 DIAGNOSIS — G4733 Obstructive sleep apnea (adult) (pediatric): Secondary | ICD-10-CM | POA: Diagnosis present

## 2014-08-05 DIAGNOSIS — I5023 Acute on chronic systolic (congestive) heart failure: Secondary | ICD-10-CM

## 2014-08-05 DIAGNOSIS — Z95810 Presence of automatic (implantable) cardiac defibrillator: Secondary | ICD-10-CM | POA: Diagnosis present

## 2014-08-05 DIAGNOSIS — E042 Nontoxic multinodular goiter: Secondary | ICD-10-CM | POA: Insufficient documentation

## 2014-08-05 LAB — COMPREHENSIVE METABOLIC PANEL
ALBUMIN: 3.5 g/dL (ref 3.5–5.2)
ALT: 12 U/L (ref 0–35)
ANION GAP: 13 (ref 5–15)
AST: 18 U/L (ref 0–37)
Alkaline Phosphatase: 87 U/L (ref 39–117)
BUN: 13 mg/dL (ref 6–23)
CO2: 33 mEq/L — ABNORMAL HIGH (ref 19–32)
CREATININE: 0.85 mg/dL (ref 0.50–1.10)
Calcium: 8.6 mg/dL (ref 8.4–10.5)
Chloride: 94 mEq/L — ABNORMAL LOW (ref 96–112)
GFR calc Af Amer: >90 mL/min (ref >90)
GFR calc non Af Amer: 80 mL/min — ABNORMAL LOW (ref >90)
Glucose, Bld: 189 mg/dL — ABNORMAL HIGH (ref 70–99)
Potassium: 3.3 mEq/L — ABNORMAL LOW (ref 3.7–5.3)
Sodium: 140 mEq/L (ref 137–147)
Total Bilirubin: 0.4 mg/dL (ref 0.3–1.2)
Total Protein: 8.4 g/dL — ABNORMAL HIGH (ref 6.0–8.3)

## 2014-08-05 LAB — CBC WITH DIFFERENTIAL/PLATELET
BASOS ABS: 0 10*3/uL (ref 0.0–0.1)
BASOS PCT: 0 % (ref 0–1)
EOS PCT: 2 % (ref 0–5)
Eosinophils Absolute: 0.2 10*3/uL (ref 0.0–0.7)
HEMATOCRIT: 39.9 % (ref 36.0–46.0)
Hemoglobin: 13.1 g/dL (ref 12.0–15.0)
Lymphocytes Relative: 32 % (ref 12–46)
Lymphs Abs: 2.2 10*3/uL (ref 0.7–4.0)
MCH: 29.8 pg (ref 26.0–34.0)
MCHC: 32.8 g/dL (ref 30.0–36.0)
MCV: 90.9 fL (ref 78.0–100.0)
MONO ABS: 0.3 10*3/uL (ref 0.1–1.0)
Monocytes Relative: 4 % (ref 3–12)
Neutro Abs: 4.2 10*3/uL (ref 1.7–7.7)
Neutrophils Relative %: 62 % (ref 43–77)
Platelets: 269 10*3/uL (ref 150–400)
RBC: 4.39 MIL/uL (ref 3.87–5.11)
RDW: 13.2 % (ref 11.5–15.5)
WBC: 6.9 10*3/uL (ref 4.0–10.5)

## 2014-08-05 LAB — BASIC METABOLIC PANEL
Anion gap: 16 — ABNORMAL HIGH (ref 5–15)
BUN: 11 mg/dL (ref 6–23)
CO2: 27 mEq/L (ref 19–32)
Calcium: 8.1 mg/dL — ABNORMAL LOW (ref 8.4–10.5)
Chloride: 95 mEq/L — ABNORMAL LOW (ref 96–112)
Creatinine, Ser: 0.9 mg/dL (ref 0.50–1.10)
GFR calc Af Amer: 86 mL/min — ABNORMAL LOW (ref >90)
GFR calc non Af Amer: 74 mL/min — ABNORMAL LOW (ref >90)
Glucose, Bld: 260 mg/dL — ABNORMAL HIGH (ref 70–99)
Potassium: 3.4 mEq/L — ABNORMAL LOW (ref 3.7–5.3)
Sodium: 138 mEq/L (ref 137–147)

## 2014-08-05 LAB — CBC
HCT: 39.5 % (ref 36.0–46.0)
Hemoglobin: 12.8 g/dL (ref 12.0–15.0)
MCH: 29.2 pg (ref 26.0–34.0)
MCHC: 32.4 g/dL (ref 30.0–36.0)
MCV: 90.2 fL (ref 78.0–100.0)
Platelets: 250 10*3/uL (ref 150–400)
RBC: 4.38 MIL/uL (ref 3.87–5.11)
RDW: 13.3 % (ref 11.5–15.5)
WBC: 7.5 10*3/uL (ref 4.0–10.5)

## 2014-08-05 LAB — I-STAT TROPONIN, ED: Troponin i, poc: 0 ng/mL (ref 0.00–0.08)

## 2014-08-05 LAB — TROPONIN I: Troponin I: <0.3 ng/mL (ref <0.30)

## 2014-08-05 LAB — PRO B NATRIURETIC PEPTIDE: PRO B NATRI PEPTIDE: 1727 pg/mL — AB (ref 0–125)

## 2014-08-05 MED ORDER — SODIUM CHLORIDE 0.9 % IV BOLUS (SEPSIS)
500.0000 mL | Freq: Once | INTRAVENOUS | Status: AC
  Administered 2014-08-05: 500 mL via INTRAVENOUS

## 2014-08-05 NOTE — ED Notes (Signed)
Pt ambulated to bathroom with standby assist. No distress.

## 2014-08-05 NOTE — Discharge Instructions (Signed)
Shortness of Breath °Shortness of breath means you have trouble breathing. Shortness of breath needs medical care right away. °HOME CARE  °· Do not smoke. °· Avoid being around chemicals or things (paint fumes, dust) that may bother your breathing. °· Rest as needed. Slowly begin your normal activities. °· Only take medicines as told by your doctor. °· Keep all doctor visits as told. °GET HELP RIGHT AWAY IF:  °· Your shortness of breath gets worse. °· You feel lightheaded, pass out (faint), or have a cough that is not helped by medicine. °· You cough up blood. °· You have pain with breathing. °· You have pain in your chest, arms, shoulders, or belly (abdomen). °· You have a fever. °· You cannot walk up stairs or exercise the way you normally do. °· You do not get better in the time expected. °· You have a hard time doing normal activities even with rest. °· You have problems with your medicines. °· You have any new symptoms. °MAKE SURE YOU: °· Understand these instructions. °· Will watch your condition. °· Will get help right away if you are not doing well or get worse. °Document Released: 02/14/2008 Document Revised: 09/02/2013 Document Reviewed: 11/13/2011 °ExitCare® Patient Information ©2015 [employer]. This information is not intended to replace advice given to you by your health care provider. Make sure you discuss any questions you have with your health care provider. ° °

## 2014-08-05 NOTE — ED Notes (Signed)
Pt arrives via [facility] from home. Pt had syncopal episodes x 3 and twisted left ankle on the last episode. Pt has a implanted defibrillator that fired twice in route to hospital. Pt has a hx of A-fib. Pt was hospitalized last week at [facility] due to A-fib. Pt c/o of SOB on exertion and at rest.

## 2014-08-05 NOTE — Telephone Encounter (Signed)
Patient calls to report that she passed out twice today, both times while sitting on the commode. Lost consciousness both times . Does not know if her defibrillator went off or not. Second time she broke out in a cold sweat. She is diabetic. BS was 190mg . Was just in the ED last Pm for SOB. Chest X-ray was unremarkable. At the time of this call her BP was 90/70. Has some vertigo also. i advised her she needed to go back to the ED for further evaluation. She was very tearful, but agreed to go.

## 2014-08-05 NOTE — ED Notes (Signed)
Pt shocked by defibrillator x 1 after being in [place]; interrogation of defibrillator in process at this time. Thayer Ohm PA at bedside

## 2014-08-05 NOTE — ED Provider Notes (Addendum)
CSN: [id number]     Arrival date & time 08/04/14  2315 History   First MD Initiated Contact with Patient 08/05/14 0131     Chief Complaint  Patient presents with  . Shortness of Breath     (Consider location/radiation/quality/duration/timing/severity/associated sxs/prior Treatment) HPI  This 48 year old female with a history of CHF secondary to nonischemic cardiomyopathy. She has normal coronary arteries per catheterization on April 20 of 2015. She was recently admitted for chest pain and discharged home on November 21. She is here with shortness of breath that worsened over the past day. It is worse with exertion. Symptoms are moderate to severe at their worst. She denies chest pain. She had a weight gain of about 8 pounds over the past week. She took an extra dose of torsemide today and attempt to diurese herself, thinking she was fluid overloaded. She is not had significant diuresis despite the extra dose. She has been constipated for several days and is taking a laxative. Her bowels began to move while in the ED. She states her shortness of breath is improved while in the ED and she does not feel that she needs to be admitted. She has asthma and has had a cough productive of clear sputum; she declines a breathing treatment stating that she will do that at home.  Past Medical History  Diagnosis Date  . CHF (congestive heart failure)     a. EF 30-35%, RV mildly dilated (difficult study 11/2013) b. RHC (12/2013): RA 38/37 (35), RV 90/28, PA 95/51 (71), PCWP 54, PA 40%, AO 96 %, CO/CI Fick 3.2/1.4, PVR 5.3   . Nonischemic cardiomyopathy 12/2013    a. LHC (12/2013) normal coronary anatomy   . Mitral regurgitation     moderate to severe  . S/P cardiac catheterization   . LBBB (left bundle branch block)     s/p [employer] CRT-D  . HTN (hypertension)   . Asthma   . IBS (irritable bowel syndrome)     with primarily constipation  . Morbid obesity   . GERD (gastroesophageal reflux disease)    . IDA (iron deficiency anemia)     2o TO SB AVMS, Hx parenteral iron Dr Mariel Sleet  . AVM (arteriovenous malformation)     Small bowel, s/p duble balloon enteroscopy/APC jejunum Dr Gwinda Passe [facility]) 01/23/2011  . Anemia, chronic disease   . Peripheral neuropathy   . OSA (obstructive sleep apnea)     on CPAP qhs  . GI bleed 06/2009    Hgb 7.5, ferritin 7, transfusion, iron IV  . Dyspnea on exertion     chronic  . Diabetes mellitus without complication   . ICD (implantable cardioverter-defibrillator), single, in situ 07/28/2014 interrogation    Pt shocked 3x since 07/20/14---pt intentionally unplugged Latitude, remote received 07/27/14 once plugged in. Pt unaware of shock on 07/27/14. Thresholds and sensing consistent with previous device measurements. Lead impedance trends stable over time.    Past Surgical History  Procedure Laterality Date  . Crdt-implantation  4/07    [employer]. remote- yes  . Appendectomy    . Cholecystectomy      biliary dyskinesia  . Mastectomy  2003    left, partial  . Knee arthroscopy  2005    left  . Small bowel enteroscopy  MAY 2012 [facility] DR. GILLIAM    SB AVMS s/p APC  . Colonoscopy  OCT 2010/SEP 2011    tortuous colon, 3 polyps-benign(2010),   . Upper gastrointestinal endoscopy  '08, '10, SEP  2011    mild antral gastritis (2010), negative SB bx (2010), incomlpete Schatzki's ring (2011)  . Small bowel enteroscopy  SEP 2011 [facility]    Fields-NO AVMS  . Givens capsule study  NOV 2010     NO AVMS, normal  . Irrigation and debridement abscess  06/25/2012    Procedure: MINOR INCISION AND DRAINAGE OF ABSCESS;  Surgeon: Marlane HatcherWilliam S Bradford, MD;  Location: [facility];  Service: General;  Laterality: [name];  Incision & Drainage of Infected Sebaceous Cyst on Chest  . Breast surgery    . Cardiac defibrillator placement      [employer]   Family History  Problem Relation Age of Onset  . Colon cancer Neg Hx     no family Hx of polyps too, uncle  .  Cervical cancer Mother   . Hypertension Mother   . Cancer Mother   . Heart disease Father   . Hypertension Father   . GER disease Father   . Heart attack Father   . Bleeding Disorder Father   . Diabetes Sister   . Heart disease Sister   . Hypertension Sister    History  Substance Use Topics  . Smoking status: Former Smoker -- 0.50 packs/day for 20 years    Types: Cigarettes    Quit date: 11/19/2009  . Smokeless tobacco: Former NeurosurgeonUser  . Alcohol Use: No   OB History    Gravida Para Term Preterm AB TAB SAB Ectopic Multiple Living   6 1 1  5  3 2        Review of Systems  All other systems reviewed and are negative.   Allergies  Cymbalta; Lyrica; and Trazodone and nefazodone  Home Medications   Prior to Admission medications   Medication Sig Start Date End Date Taking? Authorizing Provider  albuterol (PROVENTIL HFA;VENTOLIN HFA) 108 (90 BASE) MCG/ACT inhaler Inhale 2 puffs into the lungs every 6 (six) hours as needed for wheezing or shortness of breath.   Yes Historical Provider, MD  albuterol (PROVENTIL) (5 MG/ML) 0.5% nebulizer solution Take 0.5 mLs (2.5 mg total) by nebulization every 4 (four) hours as needed for wheezing or shortness of breath. 11/21/12  Yes Milana ObeyStephen D Knowlton, MD  amiodarone (PACERONE) 200 MG tablet Take 1 tablet (200 mg total) by mouth 2 (two) times daily. 07/28/14  Yes Rhonda G Barrett, PA-C  aspirin EC 81 MG EC tablet Take 1 tablet (81 mg total) by mouth daily. 12/16/13  Yes Aundria RudAli B Cosgrove, NP  carvedilol (COREG) 6.25 MG tablet Take 1 tablet (6.25 mg total) by mouth 2 (two) times daily with a meal. 06/15/14  Yes Dolores Pattyaniel R Bensimhon, MD  cetirizine (ZYRTEC) 10 MG tablet Take 10 mg by mouth daily as needed for allergies.    Yes Historical Provider, MD  cyclobenzaprine (FLEXERIL) 10 MG tablet Take 10 mg by mouth 3 (three) times daily as needed for muscle spasms.    Yes Historical Provider, MD  digoxin (LANOXIN) 0.125 MG tablet Take 1 tablet (0.125 mg total) by  mouth daily. 05/05/14  Yes Aundria RudAli B Cosgrove, NP  docusate sodium (STOOL SOFTENER) 100 MG capsule Take 100 mg by mouth daily.    Yes Historical Provider, MD  ferrous sulfate 325 (65 FE) MG tablet Take 325 mg by mouth 3 (three) times daily.   Yes Historical Provider, MD  gabapentin (NEURONTIN) 300 MG capsule Take 600 mg by mouth 4 (four) times daily.   Yes Historical Provider, MD  HYDROcodone-acetaminophen (NORCO) 10-325 MG  per tablet Take 1 tablet by mouth every 4 (four) hours as needed for moderate pain.  11/11/13  Yes Historical Provider, MD  insulin aspart (NOVOLOG) 100 UNIT/ML injection Inject 0-20 Units into the skin 3 (three) times daily with meals. 06/28/12  Yes [name], MD  insulin detemir (LEVEMIR) 100 UNIT/ML injection Inject 100 Units into the skin at bedtime.    Yes Historical Provider, MD  ivabradine HCl (CORLANOR) 5 MG TABS tablet Take 0.5 tablets (2.5 mg total) by mouth 2 (two) times daily with a meal. 08/01/14  Yes Estela Isaiah Blakes, MD  losartan (COZAAR) 50 MG tablet Take 1 tablet (50 mg total) by mouth daily. 06/23/14  Yes Laurey Morale, MD  omeprazole (PRILOSEC) 40 MG capsule Take 1 capsule (40 mg total) by mouth daily. 03/27/13  Yes Marinus Maw, MD  potassium chloride SA (K-DUR,KLOR-CON) 20 MEQ tablet Take 1 tablet (20 mEq total) by mouth 2 (two) times daily. Or as instructed when to take extra. 05/05/14  Yes Aundria Rud, NP  spironolactone (ALDACTONE) 25 MG tablet Take 1 tablet (25 mg total) by mouth daily. 04/23/14  Yes Bevelyn Buckles Bensimhon, MD  temazepam (RESTORIL) 30 MG capsule Take 30 mg by mouth at bedtime.   Yes Historical Provider, MD  torsemide (DEMADEX) 20 MG tablet Take 3 tabs in the AM (60 mg) and take 2 tabs in the PM (40 mg) Patient taking differently: Take 40-60 mg by mouth 2 (two) times daily. Take 3 tabs in the AM (60 mg) and take 2 tabs in the PM (40 mg) 06/23/14  Yes Laurey Morale, MD  vitamin E 400 UNIT capsule Take 400 Units by mouth daily.    Yes Historical Provider, MD   BP 93/68 mmHg  Pulse 70  Temp(Src) 98.1 F (36.7 C) (Oral)  Resp 23  Ht 5\' 5"  (1.651 m)  Wt 265 lb (120.203 kg)  BMI 44.10 kg/m2  SpO2 96%  LMP 06/21/2012   Physical Exam  General: Well-developed, obese female in no acute distress; appearance consistent with age of record HENT: normocephalic; atraumatic Eyes: pupils equal, round and reactive to light; extraocular muscles intact Neck: supple Heart: regular rate and rhythm (paced) with frequent PVCs Lungs: clear to auscultation bilaterally Abdomen: soft; obese; nontender; bowel sounds present Extremities: No deformity; full range of motion; pulses normal; no edema Neurologic: Awake, alert and oriented; motor function intact in all extremities and symmetric; no facial droop Skin: Warm and dry Psychiatric: Normal mood and affect   ED Course  Procedures (including critical care time)   EKG Interpretation   Date/Time:  Tuesday August 04 2014 23:44:05 EST Ventricular Rate:  70 PR Interval:  128 QRS Duration: 172 QT Interval:  677 QTC Calculation: 731 R Axis:   -178 Text Interpretation:  Atrial-sensed ventricular-paced rhythm No further  analysis attempted due to paced rhythm Now paced Confirmed by Jae Bruck  MD,  Jonny Ruiz ([id number]) on 08/04/2014 11:48:58 PM      MDM   Nursing notes and vitals signs, including pulse oximetry, reviewed.  Summary of this visit's results, reviewed by myself:  Labs:  Results for orders placed or performed during the hospital encounter of 08/04/14 (from the past 24 hour(s))  CBC with Differential     Status: None   Collection Time: 08/04/14 11:39 PM  Result Value Ref Range   WBC 6.9 4.0 - 10.5 K/uL   RBC 4.39 3.87 - 5.11 MIL/uL   Hemoglobin 13.1 12.0 - 15.0 g/dL  HCT 39.9 36.0 - 46.0 %   MCV 90.9 78.0 - 100.0 fL   MCH 29.8 26.0 - 34.0 pg   MCHC 32.8 30.0 - 36.0 g/dL   RDW 16.1 09.6 - 04.5 %   Platelets 269 150 - 400 K/uL   Neutrophils Relative % 62 43 - 77  %   Neutro Abs 4.2 1.7 - 7.7 K/uL   Lymphocytes Relative 32 12 - 46 %   Lymphs Abs 2.2 0.7 - 4.0 K/uL   Monocytes Relative 4 3 - 12 %   Monocytes Absolute 0.3 0.1 - 1.0 K/uL   Eosinophils Relative 2 0 - 5 %   Eosinophils Absolute 0.2 0.0 - 0.7 K/uL   Basophils Relative 0 0 - 1 %   Basophils Absolute 0.0 0.0 - 0.1 K/uL  Comprehensive metabolic panel     Status: Abnormal   Collection Time: 08/04/14 11:39 PM  Result Value Ref Range   Sodium 140 137 - 147 mEq/L   Potassium 3.3 (L) 3.7 - 5.3 mEq/L   Chloride 94 (L) 96 - 112 mEq/L   CO2 33 (H) 19 - 32 mEq/L   Glucose, Bld 189 (H) 70 - 99 mg/dL   BUN 13 6 - 23 mg/dL   Creatinine, Ser 4.09 0.50 - 1.10 mg/dL   Calcium 8.6 8.4 - 81.1 mg/dL   Total Protein 8.4 (H) 6.0 - 8.3 g/dL   Albumin 3.5 3.5 - 5.2 g/dL   AST 18 0 - 37 U/L   ALT 12 0 - 35 U/L   Alkaline Phosphatase 87 39 - 117 U/L   Total Bilirubin 0.4 0.3 - 1.2 mg/dL   GFR calc non Af Amer 80 (L) >90 mL/min   GFR calc Af Amer >90 >90 mL/min   Anion gap 13 5 - 15  Pro b natriuretic peptide     Status: Abnormal   Collection Time: 08/04/14 11:39 PM  Result Value Ref Range   Pro B Natriuretic peptide (BNP) 1727.0 (H) 0 - 125 pg/mL  Troponin I     Status: None   Collection Time: 08/04/14 11:39 PM  Result Value Ref Range   Troponin I <0.30 <0.30 ng/mL    Imaging Studies: Dg Chest 2 View  08/05/2014   CLINICAL DATA:  Shortness of breath  EXAM: CHEST  2 VIEW  COMPARISON:  07/30/2014  FINDINGS: There is chronic cardiomegaly. Negative aortic and hilar contours. Biventricular ICD/ pacer from the left. Lead orientation is unchanged. There is no edema, consolidation, effusion, or pneumothorax. Mild narrowing of the trachea at the thoracic inlet. No nodule seen on cervical myelogram 04/13/2014.  IMPRESSION: Cardiomegaly without failure.   Electronically Signed   By: Tiburcio Pea M.D.   On: 08/05/2014 00:44       Hanley Seamen, MD 08/05/14 0148  Hanley Seamen, MD 08/05/14 [phone]

## 2014-08-05 NOTE — ED Notes (Signed)
IV team at bedside 

## 2014-08-05 NOTE — ED Notes (Signed)
Pt's heart monitor alarming that the pt was in v-tach. This RN went to pt's room, pt was concious and breathing normally. This RN attempted to take a radial pulse at found that it was irregular. Lawyer, PA called to bedside. After a few minutes, pt's heart rate calmed back to a paced rhythm.

## 2014-08-05 NOTE — ED Notes (Signed)
MD at bedside. 

## 2014-08-05 NOTE — ED Notes (Signed)
Pt and husband state that pt began having problems with defibrillator after starting Corlanor. Pt was supposed to be taking 0.5 tabs twice a day of Corlanor; however the pharmacy label stated for patient to take 1 tab twice a day which is what she has been taking. Defibrillator has fired numerous times since then

## 2014-08-05 NOTE — Telephone Encounter (Signed)
New Msg    Patient states she is passing out every few hrs and just passed out a few hrs ago at [facility].

## 2014-08-06 ENCOUNTER — Inpatient Hospital Stay (HOSPITAL_COMMUNITY): Payer: Medicare Other

## 2014-08-06 DIAGNOSIS — I429 Cardiomyopathy, unspecified: Secondary | ICD-10-CM

## 2014-08-06 DIAGNOSIS — E042 Nontoxic multinodular goiter: Secondary | ICD-10-CM | POA: Diagnosis present

## 2014-08-06 DIAGNOSIS — I493 Ventricular premature depolarization: Secondary | ICD-10-CM | POA: Diagnosis present

## 2014-08-06 DIAGNOSIS — I472 Ventricular tachycardia: Secondary | ICD-10-CM | POA: Diagnosis present

## 2014-08-06 DIAGNOSIS — R404 Transient alteration of awareness: Secondary | ICD-10-CM | POA: Diagnosis present

## 2014-08-06 DIAGNOSIS — Z95810 Presence of automatic (implantable) cardiac defibrillator: Secondary | ICD-10-CM | POA: Diagnosis not present

## 2014-08-06 DIAGNOSIS — I10 Essential (primary) hypertension: Secondary | ICD-10-CM | POA: Diagnosis present

## 2014-08-06 DIAGNOSIS — G629 Polyneuropathy, unspecified: Secondary | ICD-10-CM | POA: Diagnosis present

## 2014-08-06 DIAGNOSIS — E876 Hypokalemia: Secondary | ICD-10-CM | POA: Diagnosis present

## 2014-08-06 DIAGNOSIS — I4901 Ventricular fibrillation: Secondary | ICD-10-CM | POA: Diagnosis present

## 2014-08-06 DIAGNOSIS — I4729 Other ventricular tachycardia: Secondary | ICD-10-CM

## 2014-08-06 DIAGNOSIS — F4310 Post-traumatic stress disorder, unspecified: Secondary | ICD-10-CM | POA: Diagnosis present

## 2014-08-06 DIAGNOSIS — Z87891 Personal history of nicotine dependence: Secondary | ICD-10-CM | POA: Diagnosis not present

## 2014-08-06 DIAGNOSIS — Z794 Long term (current) use of insulin: Secondary | ICD-10-CM | POA: Diagnosis not present

## 2014-08-06 DIAGNOSIS — E871 Hypo-osmolality and hyponatremia: Secondary | ICD-10-CM | POA: Diagnosis present

## 2014-08-06 DIAGNOSIS — S93402A Sprain of unspecified ligament of left ankle, initial encounter: Secondary | ICD-10-CM | POA: Diagnosis present

## 2014-08-06 DIAGNOSIS — Z6841 Body Mass Index (BMI) 40.0 and over, adult: Secondary | ICD-10-CM | POA: Diagnosis not present

## 2014-08-06 DIAGNOSIS — G4733 Obstructive sleep apnea (adult) (pediatric): Secondary | ICD-10-CM | POA: Diagnosis present

## 2014-08-06 DIAGNOSIS — K219 Gastro-esophageal reflux disease without esophagitis: Secondary | ICD-10-CM | POA: Diagnosis present

## 2014-08-06 DIAGNOSIS — I5043 Acute on chronic combined systolic (congestive) and diastolic (congestive) heart failure: Secondary | ICD-10-CM | POA: Diagnosis present

## 2014-08-06 DIAGNOSIS — S8392XA Sprain of unspecified site of left knee, initial encounter: Secondary | ICD-10-CM | POA: Diagnosis present

## 2014-08-06 DIAGNOSIS — I428 Other cardiomyopathies: Secondary | ICD-10-CM | POA: Diagnosis present

## 2014-08-06 DIAGNOSIS — E119 Type 2 diabetes mellitus without complications: Secondary | ICD-10-CM | POA: Diagnosis present

## 2014-08-06 DIAGNOSIS — W19XXXA Unspecified fall, initial encounter: Secondary | ICD-10-CM | POA: Diagnosis present

## 2014-08-06 DIAGNOSIS — M109 Gout, unspecified: Secondary | ICD-10-CM | POA: Diagnosis present

## 2014-08-06 DIAGNOSIS — R5381 Other malaise: Secondary | ICD-10-CM | POA: Diagnosis present

## 2014-08-06 LAB — CBC
HCT: 37 % (ref 36.0–46.0)
HCT: 40.8 % (ref 36.0–46.0)
HEMOGLOBIN: 11.9 g/dL — AB (ref 12.0–15.0)
HEMOGLOBIN: 13.3 g/dL (ref 12.0–15.0)
MCH: 29.1 pg (ref 26.0–34.0)
MCH: 30 pg (ref 26.0–34.0)
MCHC: 32.2 g/dL (ref 30.0–36.0)
MCHC: 32.6 g/dL (ref 30.0–36.0)
MCV: 90.5 fL (ref 78.0–100.0)
MCV: 92.1 fL (ref 78.0–100.0)
Platelets: 242 10*3/uL (ref 150–400)
Platelets: 232 10*3/uL (ref 150–400)
RBC: 4.09 MIL/uL (ref 3.87–5.11)
RBC: 4.43 MIL/uL (ref 3.87–5.11)
RDW: 13.3 % (ref 11.5–15.5)
RDW: 13.4 % (ref 11.5–15.5)
WBC: 6.7 10*3/uL (ref 4.0–10.5)
WBC: 7.1 10*3/uL (ref 4.0–10.5)

## 2014-08-06 LAB — BASIC METABOLIC PANEL
ANION GAP: 15 (ref 5–15)
BUN: 10 mg/dL (ref 6–23)
CALCIUM: 8.3 mg/dL — AB (ref 8.4–10.5)
CO2: 30 mEq/L (ref 19–32)
Chloride: 94 mEq/L — ABNORMAL LOW (ref 96–112)
Creatinine, Ser: 0.9 mg/dL (ref 0.50–1.10)
GFR calc Af Amer: 86 mL/min — ABNORMAL LOW (ref >90)
GFR, EST NON AFRICAN AMERICAN: 74 mL/min — AB (ref >90)
GLUCOSE: 248 mg/dL — AB (ref 70–99)
Potassium: 3.6 mEq/L — ABNORMAL LOW (ref 3.7–5.3)
SODIUM: 139 meq/L (ref 137–147)

## 2014-08-06 LAB — URINALYSIS, ROUTINE W REFLEX MICROSCOPIC
Bilirubin Urine: NEGATIVE
Glucose, UA: NEGATIVE mg/dL
Hgb urine dipstick: NEGATIVE
KETONES UR: 15 mg/dL — AB
Nitrite: NEGATIVE
Protein, ur: 100 mg/dL — AB
Specific Gravity, Urine: 1.017 (ref 1.005–1.030)
UROBILINOGEN UA: 0.2 mg/dL (ref 0.0–1.0)
pH: 5.5 (ref 5.0–8.0)

## 2014-08-06 LAB — COMPREHENSIVE METABOLIC PANEL
ALT: 11 U/L (ref 0–35)
AST: 18 U/L (ref 0–37)
Albumin: 3.1 g/dL — ABNORMAL LOW (ref 3.5–5.2)
Alkaline Phosphatase: 80 U/L (ref 39–117)
Anion gap: 13 (ref 5–15)
BILIRUBIN TOTAL: 0.5 mg/dL (ref 0.3–1.2)
BUN: 11 mg/dL (ref 6–23)
CALCIUM: 7.8 mg/dL — AB (ref 8.4–10.5)
CO2: 32 mEq/L (ref 19–32)
Chloride: 95 mEq/L — ABNORMAL LOW (ref 96–112)
Creatinine, Ser: 0.95 mg/dL (ref 0.50–1.10)
GFR, EST AFRICAN AMERICAN: 81 mL/min — AB (ref >90)
GFR, EST NON AFRICAN AMERICAN: 70 mL/min — AB (ref >90)
Glucose, Bld: 226 mg/dL — ABNORMAL HIGH (ref 70–99)
Potassium: 3.4 mEq/L — ABNORMAL LOW (ref 3.7–5.3)
Sodium: 140 mEq/L (ref 137–147)
Total Protein: 7.1 g/dL (ref 6.0–8.3)

## 2014-08-06 LAB — CREATININE, SERUM
Creatinine, Ser: 0.84 mg/dL (ref 0.50–1.10)
GFR calc Af Amer: >90 mL/min (ref >90)
GFR calc non Af Amer: 81 mL/min — ABNORMAL LOW (ref >90)

## 2014-08-06 LAB — MAGNESIUM: MAGNESIUM: 1.5 mg/dL (ref 1.5–2.5)

## 2014-08-06 LAB — URINE MICROSCOPIC-ADD ON

## 2014-08-06 LAB — DIGOXIN LEVEL: Digoxin Level: 0.7 ng/mL — ABNORMAL LOW (ref 0.8–2.0)

## 2014-08-06 LAB — GLUCOSE, CAPILLARY
GLUCOSE-CAPILLARY: 291 mg/dL — AB (ref 70–99)
Glucose-Capillary: 250 mg/dL — ABNORMAL HIGH (ref 70–99)
Glucose-Capillary: 267 mg/dL — ABNORMAL HIGH (ref 70–99)
Glucose-Capillary: 268 mg/dL — ABNORMAL HIGH (ref 70–99)
Glucose-Capillary: 244 mg/dL — ABNORMAL HIGH (ref 70–99)

## 2014-08-06 LAB — LACTIC ACID, PLASMA: Lactic Acid, Venous: 1.6 mmol/L (ref 0.5–2.2)

## 2014-08-06 LAB — CBG MONITORING, ED: GLUCOSE-CAPILLARY: 195 mg/dL — AB (ref 70–99)

## 2014-08-06 LAB — MRSA PCR SCREENING: MRSA BY PCR: NEGATIVE

## 2014-08-06 MED ORDER — DOCUSATE SODIUM 100 MG PO CAPS
100.0000 mg | ORAL_CAPSULE | Freq: Every day | ORAL | Status: DC
  Administered 2014-08-06 – 2014-08-12 (×7): 100 mg via ORAL
  Filled 2014-08-06 (×8): qty 1

## 2014-08-06 MED ORDER — INSULIN ASPART 100 UNIT/ML ~~LOC~~ SOLN
0.0000 [IU] | Freq: Three times a day (TID) | SUBCUTANEOUS | Status: DC
  Administered 2014-08-06 (×2): 11 [IU] via SUBCUTANEOUS
  Administered 2014-08-07 (×2): 7 [IU] via SUBCUTANEOUS
  Administered 2014-08-07 – 2014-08-08 (×2): 4 [IU] via SUBCUTANEOUS
  Administered 2014-08-08: 7 [IU] via SUBCUTANEOUS
  Administered 2014-08-08: 4 [IU] via SUBCUTANEOUS
  Administered 2014-08-09: 7 [IU] via SUBCUTANEOUS
  Administered 2014-08-09 – 2014-08-10 (×3): 4 [IU] via SUBCUTANEOUS
  Administered 2014-08-10 (×2): 7 [IU] via SUBCUTANEOUS
  Administered 2014-08-11: 15 [IU] via SUBCUTANEOUS
  Administered 2014-08-11 (×2): 4 [IU] via SUBCUTANEOUS
  Administered 2014-08-12: 20 [IU] via SUBCUTANEOUS
  Administered 2014-08-12: 4 [IU] via SUBCUTANEOUS
  Administered 2014-08-12: 11 [IU] via SUBCUTANEOUS
  Administered 2014-08-13 (×2): 4 [IU] via SUBCUTANEOUS
  Administered 2014-08-13 – 2014-08-14 (×2): 3 [IU] via SUBCUTANEOUS

## 2014-08-06 MED ORDER — FUROSEMIDE 10 MG/ML IJ SOLN
120.0000 mg | Freq: Three times a day (TID) | INTRAVENOUS | Status: DC
  Administered 2014-08-06 – 2014-08-08 (×7): 120 mg via INTRAVENOUS
  Filled 2014-08-06 (×9): qty 12

## 2014-08-06 MED ORDER — DIGOXIN 125 MCG PO TABS
0.1250 mg | ORAL_TABLET | Freq: Every day | ORAL | Status: DC
  Administered 2014-08-06 – 2014-08-14 (×9): 0.125 mg via ORAL
  Filled 2014-08-06 (×9): qty 1

## 2014-08-06 MED ORDER — SODIUM CHLORIDE 0.9 % IV SOLN: 250.0000 mL | INTRAVENOUS | Status: DC | PRN

## 2014-08-06 MED ORDER — AMIODARONE HCL IN DEXTROSE 360-4.14 MG/200ML-% IV SOLN
30.0000 mg/h | INTRAVENOUS | Status: DC
  Filled 2014-08-06: qty 200

## 2014-08-06 MED ORDER — SODIUM CHLORIDE 0.9 % IJ SOLN
10.0000 mL | Freq: Two times a day (BID) | INTRAMUSCULAR | Status: DC
  Administered 2014-08-06: 20 mL
  Administered 2014-08-08 – 2014-08-13 (×9): 10 mL

## 2014-08-06 MED ORDER — HYDROCODONE-ACETAMINOPHEN 10-325 MG PO TABS
1.0000 | ORAL_TABLET | ORAL | Status: DC | PRN
  Administered 2014-08-06: 1 via ORAL
  Filled 2014-08-06: qty 1

## 2014-08-06 MED ORDER — AMIODARONE HCL IN DEXTROSE 360-4.14 MG/200ML-% IV SOLN
60.0000 mg/h | INTRAVENOUS | Status: AC
  Administered 2014-08-06 – 2014-08-07 (×2): 60 mg/h via INTRAVENOUS
  Filled 2014-08-06 (×6): qty 200

## 2014-08-06 MED ORDER — AMIODARONE LOAD VIA INFUSION
150.0000 mg | Freq: Once | INTRAVENOUS | Status: AC
  Administered 2014-08-06: 150 mg via INTRAVENOUS
  Filled 2014-08-06: qty 83.34

## 2014-08-06 MED ORDER — AMIODARONE HCL IN DEXTROSE 360-4.14 MG/200ML-% IV SOLN
60.0000 mg/h | INTRAVENOUS | Status: DC
  Administered 2014-08-06: 60 mg/h via INTRAVENOUS
  Filled 2014-08-06: qty 200

## 2014-08-06 MED ORDER — INSULIN ASPART 100 UNIT/ML ~~LOC~~ SOLN
0.0000 [IU] | Freq: Every day | SUBCUTANEOUS | Status: DC
  Administered 2014-08-06 – 2014-08-07 (×2): 2 [IU] via SUBCUTANEOUS
  Administered 2014-08-09: 3 [IU] via SUBCUTANEOUS
  Administered 2014-08-11: 5 [IU] via SUBCUTANEOUS
  Administered 2014-08-12: 4 [IU] via SUBCUTANEOUS
  Administered 2014-08-13: 3 [IU] via SUBCUTANEOUS

## 2014-08-06 MED ORDER — PANTOPRAZOLE SODIUM 40 MG PO TBEC
40.0000 mg | DELAYED_RELEASE_TABLET | Freq: Every day | ORAL | Status: DC
  Administered 2014-08-06 – 2014-08-12 (×7): 40 mg via ORAL
  Filled 2014-08-06 (×7): qty 1

## 2014-08-06 MED ORDER — INSULIN DETEMIR 100 UNIT/ML ~~LOC~~ SOLN
100.0000 [IU] | Freq: Every day | SUBCUTANEOUS | Status: DC
  Filled 2014-08-06: qty 1.1

## 2014-08-06 MED ORDER — ALBUTEROL SULFATE (2.5 MG/3ML) 0.083% IN NEBU
2.5000 mg | INHALATION_SOLUTION | RESPIRATORY_TRACT | Status: DC | PRN
  Administered 2014-08-06 – 2014-08-08 (×6): 2.5 mg via RESPIRATORY_TRACT
  Filled 2014-08-06 (×12): qty 3

## 2014-08-06 MED ORDER — SODIUM CHLORIDE 0.9 % IJ SOLN
3.0000 mL | Freq: Two times a day (BID) | INTRAMUSCULAR | Status: DC
  Administered 2014-08-06 – 2014-08-13 (×10): 3 mL via INTRAVENOUS

## 2014-08-06 MED ORDER — INSULIN ASPART 100 UNIT/ML ~~LOC~~ SOLN
0.0000 [IU] | Freq: Three times a day (TID) | SUBCUTANEOUS | Status: DC
  Administered 2014-08-06: 7 [IU] via SUBCUTANEOUS

## 2014-08-06 MED ORDER — ASPIRIN EC 81 MG PO TBEC
81.0000 mg | DELAYED_RELEASE_TABLET | Freq: Every day | ORAL | Status: DC
  Administered 2014-08-06 – 2014-08-14 (×9): 81 mg via ORAL
  Filled 2014-08-06 (×9): qty 1

## 2014-08-06 MED ORDER — AMIODARONE HCL IN DEXTROSE 360-4.14 MG/200ML-% IV SOLN
60.0000 mg/h | INTRAVENOUS | Status: AC
Start: 2014-08-06 — End: 2014-08-06
  Administered 2014-08-06: 60 mg/h via INTRAVENOUS
  Filled 2014-08-06 (×2): qty 200

## 2014-08-06 MED ORDER — POTASSIUM CHLORIDE CRYS ER 20 MEQ PO TBCR
80.0000 meq | EXTENDED_RELEASE_TABLET | Freq: Once | ORAL | Status: DC
  Filled 2014-08-06: qty 4

## 2014-08-06 MED ORDER — LORAZEPAM 0.5 MG PO TABS
0.5000 mg | ORAL_TABLET | Freq: Two times a day (BID) | ORAL | Status: DC | PRN
  Administered 2014-08-06: 0.5 mg via ORAL
  Filled 2014-08-06: qty 1

## 2014-08-06 MED ORDER — ENOXAPARIN SODIUM 60 MG/0.6ML ~~LOC~~ SOLN
60.0000 mg | SUBCUTANEOUS | Status: DC
  Administered 2014-08-06 – 2014-08-13 (×8): 60 mg via SUBCUTANEOUS
  Filled 2014-08-06 (×9): qty 0.6

## 2014-08-06 MED ORDER — ONDANSETRON HCL 4 MG/2ML IJ SOLN
4.0000 mg | Freq: Four times a day (QID) | INTRAMUSCULAR | Status: DC | PRN
  Administered 2014-08-13 (×2): 4 mg via INTRAVENOUS
  Filled 2014-08-06 (×2): qty 2

## 2014-08-06 MED ORDER — HYDROMORPHONE HCL 2 MG PO TABS
2.0000 mg | ORAL_TABLET | Freq: Three times a day (TID) | ORAL | Status: DC | PRN
Start: 2014-08-06 — End: 2014-08-14
  Administered 2014-08-06 – 2014-08-14 (×7): 2 mg via ORAL
  Filled 2014-08-06 (×7): qty 1

## 2014-08-06 MED ORDER — GABAPENTIN 300 MG PO CAPS
600.0000 mg | ORAL_CAPSULE | Freq: Four times a day (QID) | ORAL | Status: DC
  Administered 2014-08-06 – 2014-08-14 (×33): 600 mg via ORAL
  Filled 2014-08-06 (×36): qty 2

## 2014-08-06 MED ORDER — CYCLOBENZAPRINE HCL 10 MG PO TABS
10.0000 mg | ORAL_TABLET | Freq: Three times a day (TID) | ORAL | Status: DC | PRN
  Administered 2014-08-07 – 2014-08-08 (×3): 10 mg via ORAL
  Filled 2014-08-06 (×3): qty 1

## 2014-08-06 MED ORDER — SODIUM CHLORIDE 0.9 % IJ SOLN
3.0000 mL | INTRAMUSCULAR | Status: DC | PRN
  Administered 2014-08-08: 3 mL via INTRAVENOUS
  Filled 2014-08-06: qty 3

## 2014-08-06 MED ORDER — INSULIN DETEMIR 100 UNIT/ML ~~LOC~~ SOLN
100.0000 [IU] | Freq: Every day | SUBCUTANEOUS | Status: DC
  Administered 2014-08-06 – 2014-08-13 (×8): 100 [IU] via SUBCUTANEOUS
  Filled 2014-08-06 (×9): qty 1

## 2014-08-06 MED ORDER — SPIRONOLACTONE 25 MG PO TABS
25.0000 mg | ORAL_TABLET | Freq: Every day | ORAL | Status: DC
  Administered 2014-08-06 – 2014-08-08 (×3): 25 mg via ORAL
  Filled 2014-08-06 (×3): qty 1

## 2014-08-06 MED ORDER — CARVEDILOL 6.25 MG PO TABS
6.2500 mg | ORAL_TABLET | Freq: Two times a day (BID) | ORAL | Status: DC
  Administered 2014-08-06 – 2014-08-14 (×17): 6.25 mg via ORAL
  Filled 2014-08-06 (×20): qty 1

## 2014-08-06 MED ORDER — VITAMIN E 180 MG (400 UNIT) PO CAPS
400.0000 [IU] | ORAL_CAPSULE | Freq: Every day | ORAL | Status: DC
  Administered 2014-08-06 – 2014-08-14 (×9): 400 [IU] via ORAL
  Filled 2014-08-06 (×9): qty 1

## 2014-08-06 MED ORDER — AMIODARONE HCL 200 MG PO TABS
400.0000 mg | ORAL_TABLET | Freq: Three times a day (TID) | ORAL | Status: DC
  Filled 2014-08-06 (×3): qty 2

## 2014-08-06 MED ORDER — ACETAMINOPHEN 325 MG PO TABS: 650.0000 mg | ORAL_TABLET | ORAL | Status: DC | PRN

## 2014-08-06 MED ORDER — FERROUS SULFATE 325 (65 FE) MG PO TABS
325.0000 mg | ORAL_TABLET | Freq: Three times a day (TID) | ORAL | Status: DC
  Administered 2014-08-06 – 2014-08-14 (×25): 325 mg via ORAL
  Filled 2014-08-06 (×27): qty 1

## 2014-08-06 MED ORDER — SODIUM CHLORIDE 0.9 % IJ SOLN
10.0000 mL | INTRAMUSCULAR | Status: DC | PRN
  Administered 2014-08-07: 10 mL
  Filled 2014-08-06: qty 40

## 2014-08-06 MED ORDER — POTASSIUM CHLORIDE CRYS ER 20 MEQ PO TBCR
40.0000 meq | EXTENDED_RELEASE_TABLET | Freq: Two times a day (BID) | ORAL | Status: DC
  Administered 2014-08-06 – 2014-08-08 (×6): 40 meq via ORAL
  Filled 2014-08-06 (×8): qty 2

## 2014-08-06 MED ORDER — IVABRADINE HCL 5 MG PO TABS
2.5000 mg | ORAL_TABLET | Freq: Two times a day (BID) | ORAL | Status: DC
  Administered 2014-08-06: 2.5 mg via ORAL
  Administered 2014-08-06: 1.5 mg via ORAL
  Administered 2014-08-07 – 2014-08-14 (×14): 2.5 mg via ORAL
  Filled 2014-08-06 (×20): qty 1

## 2014-08-06 MED ORDER — LOSARTAN POTASSIUM 50 MG PO TABS
50.0000 mg | ORAL_TABLET | Freq: Every day | ORAL | Status: DC
  Administered 2014-08-06 – 2014-08-07 (×2): 50 mg via ORAL
  Filled 2014-08-06 (×3): qty 1

## 2014-08-06 MED ORDER — ALBUTEROL SULFATE HFA 108 (90 BASE) MCG/ACT IN AERS: 2.0000 | INHALATION_SPRAY | Freq: Four times a day (QID) | RESPIRATORY_TRACT | Status: DC | PRN

## 2014-08-06 MED ORDER — AMIODARONE IV BOLUS ONLY 150 MG/100ML
150.0000 mg | INTRAVENOUS | Status: DC | PRN
  Filled 2014-08-06: qty 100

## 2014-08-06 MED ORDER — FUROSEMIDE 10 MG/ML IJ SOLN
80.0000 mg | Freq: Three times a day (TID) | INTRAMUSCULAR | Status: DC
  Administered 2014-08-06: 80 mg via INTRAVENOUS
  Filled 2014-08-06 (×4): qty 8

## 2014-08-06 MED ORDER — TEMAZEPAM 15 MG PO CAPS
30.0000 mg | ORAL_CAPSULE | Freq: Every day | ORAL | Status: DC
  Administered 2014-08-06 – 2014-08-13 (×8): 30 mg via ORAL
  Filled 2014-08-06 (×8): qty 2

## 2014-08-06 NOTE — Progress Notes (Signed)
Peripherally Inserted Central Catheter/Midline Placement  The IV Nurse has discussed with the patient and/or persons authorized to consent for the patient, the purpose of this procedure and the potential benefits and risks involved with this procedure.  The benefits include less needle sticks, lab draws from the catheter and patient may be discharged home with the catheter.  Risks include, but not limited to, infection, bleeding, blood clot (thrombus formation), and puncture of an artery; nerve damage and irregular heat beat.  Alternatives to this procedure were also discussed.  PICC/Midline Placement Documentation        [name] 08/06/2014, 2:44 PM

## 2014-08-06 NOTE — ED Provider Notes (Signed)
CSN: [id number]     Arrival date & time 08/05/14  1909 History   First MD Initiated Contact with Patient 08/05/14 1926     Chief Complaint  Patient presents with  . Loss of Consciousness     (Consider location/radiation/quality/duration/timing/severity/associated sxs/prior Treatment) HPI Patient presents to the emergency department after her defibrillator fired 3 times while at home.  Patient states that she has been feeling bad over the last several days with her cardiologist, who advised her to come to the emergency department.  Patient states that she has not had any nausea, vomiting, weakness, dizziness, headache, blurred vision, back pain, neck pain, flank.  No sore throat, fever, fall.  Patient states that she has had several episodes of possible syncope.  The patient states all this, his neck, her condition better or worse. Past Medical History  Diagnosis Date  . CHF (congestive heart failure)     a. EF 30-35%, RV mildly dilated (difficult study 11/2013) b. RHC (12/2013): RA 38/37 (35), RV 90/28, PA 95/51 (71), PCWP 54, PA 40%, AO 96 %, CO/CI Fick 3.2/1.4, PVR 5.3   . Nonischemic cardiomyopathy 12/2013    a. LHC (12/2013) normal coronary anatomy   . Mitral regurgitation     moderate to severe  . S/P cardiac catheterization   . LBBB (left bundle branch block)     s/p [employer] CRT-D  . HTN (hypertension)   . Asthma   . IBS (irritable bowel syndrome)     with primarily constipation  . Morbid obesity   . GERD (gastroesophageal reflux disease)   . IDA (iron deficiency anemia)     2o TO SB AVMS, Hx parenteral iron Dr Mariel Sleet  . AVM (arteriovenous malformation)     Small bowel, s/p duble balloon enteroscopy/APC jejunum Dr G[facility]a [facility]) 01/23/2011  . Anemia, chronic disease   . Peripheral neuropathy   . OSA (obstructive sleep apnea)     on CPAP qhs  . GI bleed 06/2009    Hgb 7.5, ferritin 7, transfusion, iron IV  . Dyspnea on exertion     chronic  . Diabetes mellitus  without complication   . ICD (implantable cardioverter-defibrillator), single, in situ 07/28/2014 interrogation    Pt shocked 3x since 07/20/14---pt intentionally unplugged Latitude, remote received 07/27/14 once plugged in. Pt unaware of shock on 07/27/14. Thresholds and sensing consistent with previous device measurements. Lead impedance trends stable over time.    Past Surgical History  Procedure Laterality Date  . Crdt-implantation  4/07    [employer]. remote- yes  . Appendectomy    . Cholecystectomy      biliary dyskinesia  . Mastectomy  2003    left, partial  . Knee arthroscopy  2005    left  . Small bowel enteroscopy  MAY 2012 [facility] DR. GILLIAM    SB AVMS s/p APC  . Colonoscopy  OCT 2010/SEP 2011    tortuous colon, 3 polyps-benign(2010),   . Upper gastrointestinal endoscopy  '08, '10, SEP 2011    mild antral gastritis (2010), negative SB bx (2010), incomlpete Schatzki's ring (2011)  . Small bowel enteroscopy  SEP 2011 [facility]    Fields-NO AVMS  . Givens capsule study  NOV 2010     NO AVMS, normal  . Irrigation and debridement abscess  06/25/2012    Procedure: MINOR INCISION AND DRAINAGE OF ABSCESS;  Surgeon: [name], MD;  Location: [facility];  Service: General;  Laterality: [name];  Incision & Drainage of Infected  Sebaceous Cyst on Chest  . Breast surgery    . Cardiac defibrillator placement      [employer]   Family History  Problem Relation Age of Onset  . Colon cancer Neg Hx     no family Hx of polyps too, uncle  . Cervical cancer Mother   . Hypertension Mother   . Cancer Mother   . Heart disease Father   . Hypertension Father   . GER disease Father   . Heart attack Father   . Bleeding Disorder Father   . Diabetes Sister   . Heart disease Sister   . Hypertension Sister    History  Substance Use Topics  . Smoking status: Former Smoker -- 0.50 packs/day for 20 years    Types: Cigarettes    Quit date: 11/19/2009  . Smokeless tobacco:  Former Neurosurgeon  . Alcohol Use: No   OB History    Gravida Para Term Preterm AB TAB SAB Ectopic Multiple Living   6 1 1  5  3 2        Review of Systems  All other systems negative except as documented in the HPI. All pertinent positives and negatives as reviewed in the HPI.  Allergies  Cymbalta; Trazodone and nefazodone; and Lyrica  Home Medications   Prior to Admission medications   Medication Sig Start Date End Date Taking? Authorizing Provider  albuterol (PROVENTIL HFA;VENTOLIN HFA) 108 (90 BASE) MCG/ACT inhaler Inhale 2 puffs into the lungs every 6 (six) hours as needed for wheezing or shortness of breath.   Yes Historical Provider, MD  albuterol (PROVENTIL) (5 MG/ML) 0.5% nebulizer solution Take 0.5 mLs (2.5 mg total) by nebulization every 4 (four) hours as needed for wheezing or shortness of breath. 11/21/12  Yes [name], MD  amiodarone (PACERONE) 200 MG tablet Take 1 tablet (200 mg total) by mouth 2 (two) times daily. 07/28/14  Yes Rhonda G Barrett, PA-C  aspirin EC 81 MG EC tablet Take 1 tablet (81 mg total) by mouth daily. 12/16/13  Yes Aundria Rud, NP  carvedilol (COREG) 6.25 MG tablet Take 1 tablet (6.25 mg total) by mouth 2 (two) times daily with a meal. 06/15/14  Yes Dolores Patty, MD  cetirizine (ZYRTEC) 10 MG tablet Take 10 mg by mouth daily as needed for allergies.    Yes Historical Provider, MD  cyclobenzaprine (FLEXERIL) 10 MG tablet Take 10 mg by mouth 3 (three) times daily as needed for muscle spasms.    Yes Historical Provider, MD  digoxin (LANOXIN) 0.125 MG tablet Take 1 tablet (0.125 mg total) by mouth daily. 05/05/14  Yes Aundria Rud, NP  docusate sodium (STOOL SOFTENER) 100 MG capsule Take 100 mg by mouth daily.    Yes Historical Provider, MD  ferrous sulfate 325 (65 FE) MG tablet Take 325 mg by mouth 3 (three) times daily.   Yes Historical Provider, MD  gabapentin (NEURONTIN) 300 MG capsule Take 600 mg by mouth 4 (four) times daily.   Yes Historical  Provider, MD  HYDROcodone-acetaminophen (NORCO) 10-325 MG per tablet Take 1 tablet by mouth every 4 (four) hours as needed for moderate pain.  11/11/13  Yes Historical Provider, MD  insulin aspart (NOVOLOG) 100 UNIT/ML injection Inject 0-20 Units into the skin 3 (three) times daily with meals. 06/28/12  Yes [name], MD  insulin detemir (LEVEMIR) 100 UNIT/ML injection Inject 100-110 Units into the skin at bedtime.    Yes Historical Provider, MD  ivabradine HCl (  CORLANOR) 5 MG TABS tablet Take 0.5 tablets (2.5 mg total) by mouth 2 (two) times daily with a meal. 08/01/14  Yes Estela Isaiah BlakesY Hernandez Acosta, MD  losartan (COZAAR) 50 MG tablet Take 1 tablet (50 mg total) by mouth daily. 06/23/14  Yes Laurey Moralealton S McLean, MD  omeprazole (PRILOSEC) 40 MG capsule Take 1 capsule (40 mg total) by mouth daily. 03/27/13  Yes Marinus MawGregg W Taylor, MD  potassium chloride SA (K-DUR,KLOR-CON) 20 MEQ tablet Take 1 tablet (20 mEq total) by mouth 2 (two) times daily. Or as instructed when to take extra. 05/05/14  Yes Aundria RudAli B Cosgrove, NP  spironolactone (ALDACTONE) 25 MG tablet Take 1 tablet (25 mg total) by mouth daily. 04/23/14  Yes Bevelyn Bucklesaniel R Bensimhon, MD  temazepam (RESTORIL) 30 MG capsule Take 30 mg by mouth at bedtime.   Yes Historical Provider, MD  torsemide (DEMADEX) 20 MG tablet Take 3 tabs in the AM (60 mg) and take 2 tabs in the PM (40 mg) Patient taking differently: Take 40-60 mg by mouth 2 (two) times daily. Take 3 tabs in the AM (60 mg) and take 2 tabs in the PM (40 mg) 06/23/14  Yes Laurey Moralealton S McLean, MD  vitamin E 400 UNIT capsule Take 400 Units by mouth daily.   Yes Historical Provider, MD   BP 121/98 mmHg  Pulse 74  Temp(Src) 97.9 F (36.6 C) (Oral)  Resp 20  Ht 5\' 5"  (1.651 m)  Wt 269 lb (122.018 kg)  BMI 44.76 kg/m2  SpO2 100%  LMP 06/21/2012 Physical Exam  Constitutional: She is oriented to person, place, and time. She appears well-developed and well-nourished. No distress.  HENT:  Head: Normocephalic  and atraumatic.  Mouth/Throat: Oropharynx is clear and moist.  Eyes: Pupils are equal, round, and reactive to light.  Neck: Normal range of motion. Neck supple.  Cardiovascular: Normal heart sounds and normal pulses.  An irregular rhythm present. Exam reveals no gallop and no friction rub.   No murmur heard. Pulmonary/Chest: Effort normal and breath sounds normal.  Abdominal: Soft. Bowel sounds are normal. She exhibits no distension. There is no tenderness.  Musculoskeletal: She exhibits no edema.  Neurological: She is alert and oriented to person, place, and time. She exhibits normal muscle tone. Coordination normal.  Skin: Skin is warm and dry. No rash noted. No erythema.  Nursing note and vitals reviewed.   ED Course  Procedures (including critical care time) Labs Review Labs Reviewed  BASIC METABOLIC PANEL - Abnormal; Notable for the following:    Potassium 3.4 (*)    Chloride 95 (*)    Glucose, Bld 260 (*)    Calcium 8.1 (*)    GFR calc non Af Amer 74 (*)    GFR calc Af Amer 86 (*)    Anion gap 16 (*)    All other components within normal limits  CBC  I-STAT TROPOININ, ED    Imaging Review Dg Chest 2 View  08/05/2014   CLINICAL DATA:  Shortness of breath  EXAM: CHEST  2 VIEW  COMPARISON:  07/30/2014  FINDINGS: There is chronic cardiomegaly. Negative aortic and hilar contours. Biventricular ICD/ pacer from the left. Lead orientation is unchanged. There is no edema, consolidation, effusion, or pneumothorax. Mild narrowing of the trachea at the thoracic inlet. No nodule seen on cervical myelogram 04/13/2014.  IMPRESSION: Cardiomegaly without failure.   Electronically Signed   By: Tiburcio PeaJonathan  Watts M.D.   On: 08/05/2014 00:44   [facility]  08/05/2014   CLINICAL DATA:  Syncope. Shortness of breath, dizziness. Chest discomfort.  EXAM: PORTABLE CHEST - 1 VIEW  COMPARISON:  08/04/2014  FINDINGS: Left pacer remains in place, unchanged. Cardiomegaly. No confluent opacities or  effusions. No edema. No acute bony abnormality.  IMPRESSION: Cardiomegaly.  No active disease.   Electronically Signed   By: Charlett Nose M.D.   On: 08/05/2014 21:10     EKG Interpretation None     The patient will be admitted to the hospital for further evaluation and care.  The [place] scientific rep came and interpreted the defibrillator and she had another shock for VF MDM   Final diagnoses:  AICD discharge        Carlyle Dolly, PA-C 08/06/14 0122  Tilden Fossa, MD 08/06/14 [phone]

## 2014-08-06 NOTE — Progress Notes (Signed)
Placed pt. On CPAP of 10cm H2O per home settings via FFM (what pt. Wears at home) with 3L O2 bled in. Pt. Is tolerating CPAP well at this time without any complications.

## 2014-08-06 NOTE — Progress Notes (Signed)
   Called by RN.  Patient having pain in her shoulder from where she fell with her syncopal episode, ICD shock. Norco provided no relief. Will change to Dilaudid 2 mg q 8 hrs prn. Signed,  Tereso Newcomer, PA-C   08/06/2014 9:26 AM

## 2014-08-06 NOTE — Progress Notes (Signed)
Patient Name: [name]      SUBJECTIVE: nonsichemic cardiomyiopathy, last EF 35 3/15 with RHC CI 1.4 and RA pressures 30 S/p CRT  Syncopal VT Barbara few weeks ago, declined admission and amio started as outpt.  Thought also to be in HF Recurrent syncope last pm>>ICD--VT.  She feels distended but interestingly weight is relatively stable over recent weeks, but she has felt terrible and been in bed for most of three weeks.  She is scared  She has [name]     Past Medical History  Diagnosis Date  . CHF (congestive heart failure)     Barbara. EF 30-35%, RV mildly dilated (difficult study 11/2013) b. RHC (12/2013): RA 38/37 (35), RV 90/28, PA 95/51 (71), PCWP 54, PA 40%, AO 96 %, CO/CI Fick 3.2/1.4, PVR 5.3   . Nonischemic cardiomyopathy 12/2013    Barbara. LHC (12/2013) normal coronary anatomy   . Mitral regurgitation     moderate to severe  . S/P cardiac catheterization   . LBBB (left bundle branch block)     s/p [employer] CRT-D  . HTN (hypertension)   . Asthma   . IBS (irritable bowel syndrome)     with primarily constipation  . Morbid obesity   . GERD (gastroesophageal reflux disease)   . IDA (iron deficiency anemia)     2o TO SB AVMS, Hx parenteral iron Dr Mariel Sleet  . AVM (arteriovenous malformation)     Small bowel, s/p duble balloon enteroscopy/APC jejunum Dr Gwinda [facility]) 01/23/2011  . Anemia, chronic disease   . Peripheral neuropathy   . OSA (obstructive sleep apnea)     on CPAP qhs  . GI bleed 06/2009    Hgb 7.5, ferritin 7, transfusion, iron IV  . Dyspnea on exertion     chronic  . Diabetes mellitus without complication   . ICD (implantable cardioverter-defibrillator), single, in situ 07/28/2014 interrogation    Pt shocked 3x since 07/20/14---pt intentionally unplugged Lat[employer], remote received 07/27/14 once plugged in. Pt unaware of shock on 07/27/14. Thresholds and sensing consistent with previous device measurements. Lead impedance trends  stable over time.     Scheduled Meds:  Scheduled Meds: . amiodarone  150 mg Intravenous Once  . aspirin EC  81 mg Oral Daily  . carvedilol  6.25 mg Oral BID WC  . digoxin  0.125 mg Oral Daily  . docusate sodium  100 mg Oral Daily  . enoxaparin (LOVENOX) injection  60 mg Subcutaneous Q24H  . ferrous sulfate  325 mg Oral TID  . furosemide  80 mg Intravenous Q8H  . gabapentin  600 mg Oral QID  . insulin aspart  0-20 Units Subcutaneous TID WC  . insulin aspart  0-5 Units Subcutaneous QHS  . insulin detemir  100-110 Units Subcutaneous QHS  . ivabradine  2.5 mg Oral BID WC  . losartan  50 mg Oral Daily  . pantoprazole  40 mg Oral Daily  . potassium chloride SA  40 mEq Oral BID  . potassium chloride  80 mEq Oral Once  . sodium chloride  3 mL Intravenous Q12H  . spironolactone  25 mg Oral Daily  . temazepam  30 mg Oral QHS  . vitamin E  400 Units Oral Daily   Continuous Infusions: . amiodarone 60 mg/hr (08/06/14 1030)   Followed by  . amiodarone     sodium chloride, acetaminophen, albuterol, cyclobenzaprine, HYDROmorphone, ondansetron (ZOFRAN) IV, sodium chloride  PHYSICAL EXAM Filed Vitals:   08/06/14 0600 08/06/14 0827 08/06/14 0900 08/06/14 0953  BP: 128/86 126/75    Pulse: 75 75  97  Temp:  97.8 F (36.6 C)    TempSrc:  Oral    Resp: 25 25    Height:      Weight:      SpO2: 100% 100% 99%     General appearance: alert, moderate distress and anxious Neck: supple, symmetrical, trachea midline and unagble to discernneck veins Lungs: clear to auscultation bilaterally Heart: regular rate and rhythm, S1, S2 normal, no murmur, click, rub or gallop Abdomen: soft, non-tender; bowel sounds normal; no masses,  no organomegaly Extremities: extremities normal, atraumatic, no cyanosis or edema Skin: Skin color, texture, turgor normal. No rashes or lesions Neurologic: Grossly normal  TELEMETRY: Reviewed telemetry pt in P-synchronous/ AV  pacing with few  PVC:    Intake/Output Summary (Last 24 hours) at 08/06/14 1137 Last data filed at 08/06/14 0647  Gross per 24 hour  Intake    120 ml  Output    800 ml  Net   -680 ml    LABS: Basic Metabolic Panel:  Recent Labs Lab 07/30/14 2204 07/31/14 0736 07/31/14 1335 08/04/14 2339 08/05/14 2041 08/06/14 0213 08/06/14 0359 08/06/14 0954  NA 133* 138 139 140 138 140  --  139  K 3.1* 3.4* 3.4* 3.3* 3.4* 3.4*  --  3.6*  CL 87* 93* 94* 94* 95* 95*  --  94*  CO2 31 34* 34* 33* 27 32  --  30  GLUCOSE 434* 249* 248* 189* 260* 226*  --  248*  BUN 11 10 10 13 11 11   --  10  CREATININE 0.91 0.91 0.90 0.85 0.90 0.95 0.84 0.90  CALCIUM 8.1* 7.9* 8.3* 8.6 8.1* 7.8*  --  8.3*  MG  --   --  1.4*  --   --   --   --  1.5   Cardiac Enzymes:  Recent Labs  08/04/14 2339  TROPONINI <0.30   CBC:  Recent Labs Lab 07/30/14 2204 07/31/14 0736 08/04/14 2339 08/05/14 2041 08/06/14 0213 08/06/14 0800  WBC 7.4 5.9 6.9 7.5 6.7 7.1  NEUTROABS 4.6  --  4.2  --   --   --   HGB 13.1 12.7 13.1 12.8 11.9* 13.3  HCT 39.2 38.2 39.9 39.5 37.0 40.8  MCV 89.1 90.1 90.9 90.2 90.5 92.1  PLT 257 264 269 250 242 232   PROTIME: No results for input(s): LABPROT, INR in the last 72 hours. Liver Function Tests:  Recent Labs  08/04/14 2339 08/06/14 0213  AST 18 18  ALT 12 11  ALKPHOS 87 80  BILITOT 0.4 0.5  PROT 8.4* 7.1  ALBUMIN 3.5 3.1*   No results for input(s): LIPASE, AMYLASE in the last 72 hours. BNP: BNP (last 3 results)  Recent Labs  12/08/13 1028 12/23/13 1105 08/04/14 2339  PROBNP 948.6* 248.1* 1727.0*   D-Dimer: No results for input(s): DDIMER in the last 72 hours. Hemoglobin A1C:   Device Interrogation pending  ASSESSMENT AND PLAN:  Active Problems:   Automatic implantable cardioverter-defibrillator in situ   OSA on CPAP   Acute combined systolic and diastolic heart failure   Paroxysmal ventricular tachycardia   I am perplexed at the overall functional  deteriortation in the absence of weight gain.  [facility]...  Her electrolytes are pretty good, and wonder if the flurry of VT isnt related  to electromechanical interactions.  Overall i am concerned at her prognosis.    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

## 2014-08-06 NOTE — H&P (Signed)
CARDIOLOGY CONSULT NOTE  Assessment and Plan:  *ICD discharges: Reviewing the ICD interrogation, patient appears to have appropriate ICD shocks for VF arrests. Patient appears to be heme urinary stable at this point. She was started on amiodarone 200 mg by mouth twice a day on 07/28/2014. She has received total of 3.6 g off amiodarone so far. We will increase her amiodarone load to 400 mg by mouth 3 times a day for now due to recurrent ventricular arrhythmias. The etiology behind VF arrests certainly could be due to underlying progressive cardiomyopathy versus electrolyte imbalance versus volume overload and decompensated heart failure. -- Increase amiodarone to 400 mg by mouth 3 times a day -- Continue carvedilol 6.25 mg by mouth twice a day -- Treated volume overload as below  *Nonischemic cardiomyopathy s/p biventricular ICD [facility] Scientific): Patient is endorsing symptoms suggestive of NYHA class 3-4 symptoms. She has on exam evidence of volume overload today with abdominal distention and likely elevated jugular venous pressure. Her last right heart catheterization was performed in April 2015 that showed right atrial pressure of 35 with RV pressure of 90/26, pulmonic capillary wedge pressure 54 with cardiac index of 1.4. This clearly suggest severe heart failure and low output state. --Of note, Bi-V pacing percentage has decreased from 97--->87% since last reset on 07/28/2014 possibly secondary to ventricular arrhythmia.  --Patient takes torsemide 60 mg by mouth in the morning and 40 mg at night. We'll change to 80 mg Lasix IV 3 times a day for now. --Continue carvedilol 6.25 mg by mouth twice a day. Should patient have in adequate response to IV Lasix consider backing down on carvedilol --Continue digoxin 0.125 mg daily. We'll check dig level --Continue losartan 50 mg daily. --Continue Aldactone 25 mg daily  *Hypertension: Well-controlled continue home medications.  *Diabetes: continue home  insulin.  *Obstructive sleep apnea:continue home CPAP    Primary cardiologist: Dr. 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 Patient states that at the end of October there were some medication changes made. She was started on Cozaar and was also started on Ivabradine. In the early part of November, patient had 3 episodes of syncope which correlated with ventricular tachyarrhythmias. She was subsequent seen by Dr. [name] who had recommended inpatient admission for initiation of IV amiodarone. However, patient refused to be admitted to the hospital at that point and was instead started on amiodarone 200 mg by mouth twice a day. She tolerated the medication well without any side effects. However, over past one half week she has noticed gradual rise in her weight and dyspnea on exertion and orthopnea. A couple days ago, patient noted symptoms of increasing sputum production and was eventually seen at a local emergency department where she had a thorough workup and was discharged. However, yesterday patient had 1 episode of syncope while sitting on a commode at [facility] followed by 2 more episodes at home. Patient recalls having dizziness lightheadedness and palpitations followed by returning of mentation and pain in her chest.  Patient endorses medication compliance. Although, she has not taken higher doses of torsemide even with gradual weight gain over past week and a half.  In the emergency department, patient had interrogation of her ICD that showed  ventricular fibrillation episodes which were successfully terminated with 35 J shocks 3.  Cardiac history:  Nonischemic cardiomyopathy status post  bi-V ICD [employer]  VT/VF s/p ICD discharges x 6 in 07/2014  Hypertension  Diabetes  Obstructive sleep apnea Previous cardiac imaging  EKG: Normal sinus rhythm with bi-V pacing   TTE 11/2013: LVEF 35-40%, E/E' 19, severely dilated left atrium, mildly dilated RV, mild pulmonary hypertension  NM Stress test 07/03/2014: "normal stress test"  CPX: Peak VO2 17.1 ml/kg in 02/2014  Past Medical History Past Medical History  Diagnosis Date  . CHF (congestive heart failure)     a. EF 30-35%, RV mildly dilated (difficult study 11/2013) b. RHC (12/2013): RA 38/37 (35), RV 90/28, PA 95/51 (71), PCWP 54, PA 40%, AO 96 %, CO/CI Fick 3.2/1.4, PVR 5.3   . Nonischemic cardiomyopathy 12/2013    a. LHC (12/2013) normal coronary anatomy   . Mitral regurgitation     moderate to severe  . S/P cardiac catheterization   . LBBB (left bundle branch block)     s/p [employer] CRT-D  . HTN (hypertension)   . Asthma   . IBS (irritable bowel syndrome)     with primarily constipation  . Morbid obesity   . GERD (gastroesophageal reflux disease)   . IDA (iron deficiency anemia)     2o TO SB AVMS, Hx parenteral iron Dr Mariel Sleet  . AVM (arteriovenous malformation)     Small bowel, s/p duble balloon enteroscopy/APC jejunum Dr Gwinda Passe [facility]) 01/23/2011  . Anemia, chronic disease   . Peripheral neuropathy   . OSA (obstructive sleep apnea)     on CPAP qhs  . GI bleed 06/2009    Hgb 7.5, ferritin 7, transfusion, iron IV  . Dyspnea on exertion     chronic  . Diabetes mellitus without complication   . ICD (implantable cardioverter-defibrillator), single, in situ 07/28/2014 interrogation    Pt shocked 3x since 07/20/14---pt intentionally unplugged Latitude, remote received 07/27/14 once plugged in. Pt unaware of shock on 07/27/14. Thresholds and sensing consistent with previous device measurements. Lead impedance trends stable over time.     Allergies: Allergies  Allergen Reactions  . Cymbalta  [Duloxetine Hcl] Other (See Comments)    "Spontaneous type behavior"  . Trazodone And Nefazodone Other (See Comments)    Nightmares   . Lyrica [Pregabalin] Swelling    Social History History   Social History  . Marital Status: Single    Spouse Name: [name]    Number of Children: 1  . Years of Education: [name]   Occupational History  . disabled    Social History Main Topics  . Smoking status: Former Smoker -- 0.50 packs/day for 20 years    Types: Cigarettes    Quit date: 11/19/2009  . Smokeless tobacco: Former Neurosurgeon  . Alcohol Use: No  . Drug Use: No  . Sexual Activity: Yes    Birth Control/ Protection: Implant   Other Topics Concern  . 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   Single- 1 daughter age 30.    Does not get regular exercise.      Family History Family History  Problem Relation Age of Onset  . Colon cancer Neg Hx     no family Hx of polyps too, uncle  . Cervical cancer Mother   . Hypertension Mother   . Cancer Mother   . Heart disease Father   . Hypertension  Father   . GER disease Father   . Heart attack Father   . Bleeding Disorder Father   . Diabetes Sister   . Heart disease Sister   . Hypertension Sister     Physical Exam Filed Vitals:   08/06/14 0000  BP: 119/75  Pulse: 97  Temp:   Resp: 18    Physical Exam  Labs:  Gen.: Morbidly obese Skin: Warm to touch. Neck: Thick, difficult to evaluate jugular venous distention Cardiovascular: Regular rate rhythm, normal S1, normal S2, positive S3 and S4 +2 pulses in upper extremities, distant heart sounds Pulmonary: No marker breathing at rest on 2 L nasal cannula difficult to auscultate breath sounds due to body habitus Abdomen: Morbidly obese, difficult to palpate abdominal organs or fluid wave Extremities: Trace lower extremity edema

## 2014-08-06 NOTE — ED Provider Notes (Signed)
ED ECG REPORT  I personally interpreted this EKG   Date: 08/06/2014   Rate: 80  Rhythm: paced rhythm  QRS Axis: right  Intervals: normal  ST/T Wave abnormalities: nonspecific ST/T changes  Conduction Disutrbances:nonspecific intraventricular conduction delay  Narrative Interpretation:   Old EKG Reviewed: unchanged   Vida Roller, MD 08/06/14 0100

## 2014-08-07 DIAGNOSIS — I059 Rheumatic mitral valve disease, unspecified: Secondary | ICD-10-CM

## 2014-08-07 LAB — BASIC METABOLIC PANEL
Anion gap: 12 (ref 5–15)
BUN: 11 mg/dL (ref 6–23)
CHLORIDE: 98 meq/L (ref 96–112)
CO2: 33 mEq/L — ABNORMAL HIGH (ref 19–32)
CREATININE: 0.9 mg/dL (ref 0.50–1.10)
Calcium: 8.1 mg/dL — ABNORMAL LOW (ref 8.4–10.5)
GFR calc non Af Amer: 74 mL/min — ABNORMAL LOW (ref >90)
GFR, EST AFRICAN AMERICAN: 86 mL/min — AB (ref >90)
Glucose, Bld: 161 mg/dL — ABNORMAL HIGH (ref 70–99)
Potassium: 3.6 mEq/L — ABNORMAL LOW (ref 3.7–5.3)
Sodium: 143 mEq/L (ref 137–147)

## 2014-08-07 LAB — CARBOXYHEMOGLOBIN
CARBOXYHEMOGLOBIN: 1.6 % — AB (ref 0.5–1.5)
METHEMOGLOBIN: 0.9 % (ref 0.0–1.5)
O2 Saturation: 62.5 %
Total hemoglobin: 12.1 g/dL (ref 12.0–16.0)

## 2014-08-07 LAB — GLUCOSE, CAPILLARY
GLUCOSE-CAPILLARY: 192 mg/dL — AB (ref 70–99)
GLUCOSE-CAPILLARY: 244 mg/dL — AB (ref 70–99)
GLUCOSE-CAPILLARY: 203 mg/dL — AB (ref 70–99)
GLUCOSE-CAPILLARY: 245 mg/dL — AB (ref 70–99)

## 2014-08-07 MED ORDER — GLUCERNA SHAKE PO LIQD
237.0000 mL | Freq: Every day | ORAL | Status: DC
  Administered 2014-08-07 – 2014-08-13 (×6): 237 mL via ORAL

## 2014-08-07 MED ORDER — AMIODARONE HCL IN DEXTROSE 360-4.14 MG/200ML-% IV SOLN
60.0000 mg/h | INTRAVENOUS | Status: AC
  Administered 2014-08-07 – 2014-08-08 (×4): 60 mg/h via INTRAVENOUS
  Filled 2014-08-07 (×7): qty 200

## 2014-08-07 NOTE — Plan of Care (Signed)
Problem: Phase II Progression Outcomes Goal: Other Phase II Outcomes/Goals Outcome: Not Applicable Date Met:  08/07/14     

## 2014-08-07 NOTE — Plan of Care (Signed)
Problem: Phase III Progression Outcomes Goal: Other Phase III Outcomes/Goals Outcome: Not Applicable Date Met:  20/94/70

## 2014-08-07 NOTE — Progress Notes (Signed)
Echocardiogram 2D Echocardiogram has been performed.  [name] 08/07/2014, 10:35 AM

## 2014-08-07 NOTE — Progress Notes (Signed)
UR completed.  Danyela Posas, RN BSN [facility] CCM Trauma/Neuro ICU Case Manager [phone]  

## 2014-08-07 NOTE — Plan of Care (Signed)
Problem: Phase I Progression Outcomes Goal: EF % per last Echo/documented,Core Reminder form on chart Outcome: Completed/Met Date Met:  08/07/14 EF 30 to 35 % per ECHO

## 2014-08-07 NOTE — Progress Notes (Signed)
INITIAL NUTRITION ASSESSMENT  DOCUMENTATION CODES Per approved criteria  -Morbid Obesity   INTERVENTION: Glucerna Shake po daily, each supplement provides 220 kcal and 10 grams of protein RD to follow for nutrition care plan  NUTRITION DIAGNOSIS: Inadequate oral intake related to decreased appetite as evidenced by pt report  Goal: Pt to meet >/= 90% of their estimated nutrition needs   Monitor:  PO & supplemental intake, weight, labs, I/O's  Reason for Assessment: Malnutrition Screening Tool Report  48 y.o. female  Admitting Dx: syncopal episode, ICD shock  ASSESSMENT: 48 y.o. Female with PMH of CHF, HTN, asthma, morbid obesity; presented to ED after her defibrillator fired 3 times while at home; + several episodes of possible syncope.  Patient reports a decreased appetite PTA for approximately 2 weeks; was eating 3 meals per day, however, smaller portions; she also endorses weight loss, however, more than likely due to fluid loss (on Lasix); no % PO intake available per flowsheet records; amenable to Glucerna Shake supplement; RD to order.  Height: Ht Readings from Last 1 Encounters:  08/06/14 5\' 5"  (1.651 m)    Weight: Wt Readings from Last 1 Encounters:  08/07/14 264 lb 12.4 oz (120.1 kg)    Ideal Body Weight: 125 lb  % Ideal Body Weight: 211%  Wt Readings from Last 10 Encounters:  08/07/14 264 lb 12.4 oz (120.1 kg)  08/04/14 265 lb (120.203 kg)  08/01/14 268 lb 11.9 oz (121.9 kg)  07/10/14 262 lb 6.4 oz (119.024 kg)  06/23/14 269 lb (122.018 kg)  04/23/14 272 lb 12.8 oz (123.741 kg)  04/13/14 266 lb (120.657 kg)  02/27/14 271 lb (122.925 kg)  02/23/14 271 lb 6 oz (123.095 kg)  01/23/14 269 lb 2 oz (122.074 kg)    Usual Body Weight: 265 lb  % Usual Body Weight: 99%  BMI:  Body mass index is 44.06 kg/(m^2).  Estimated Nutritional Needs: Kcal: 1700-1900 Protein: 80-90 gm Fluid: 1.7-1.9 L  Skin: Intact  Diet Order: Diet 2 gram sodium  EDUCATION  NEEDS: -No education needs identified at this time   Intake/Output Summary (Last 24 hours) at 08/07/14 1040 Last data filed at 08/07/14 0949  Gross per 24 hour  Intake 934.48 ml  Output   3000 ml  Net -2065.52 ml    Labs:   Recent Labs Lab 07/31/14 1335  08/06/14 0213 08/06/14 0359 08/06/14 0954 08/07/14 0530  NA 139  < > 140  --  139 143  K 3.4*  < > 3.4*  --  3.6* 3.6*  CL 94*  < > 95*  --  94* 98  CO2 34*  < > 32  --  30 33*  BUN 10  < > 11  --  10 11  CREATININE 0.90  < > 0.95 0.84 0.90 0.90  CALCIUM 8.3*  < > 7.8*  --  8.3* 8.1*  MG 1.4*  --   --   --  1.5  --   GLUCOSE 248*  < > 226*  --  248* 161*  < > = values in this interval not displayed.  CBG (last 3)   Recent Labs  08/06/14 2033 08/06/14 2215 08/07/14 0811  GLUCAP 268* 244* 192*    Scheduled Meds: . aspirin EC  81 mg Oral Daily  . carvedilol  6.25 mg Oral BID WC  . digoxin  0.125 mg Oral Daily  . docusate sodium  100 mg Oral Daily  . enoxaparin (LOVENOX) injection  60 mg Subcutaneous Q24H  .  ferrous sulfate  325 mg Oral TID  . furosemide  120 mg Intravenous Q8H  . gabapentin  600 mg Oral QID  . insulin aspart  0-20 Units Subcutaneous TID WC  . insulin aspart  0-5 Units Subcutaneous QHS  . insulin detemir  100 Units Subcutaneous QHS  . ivabradine  2.5 mg Oral BID WC  . losartan  50 mg Oral Daily  . pantoprazole  40 mg Oral Daily  . potassium chloride SA  40 mEq Oral BID  . potassium chloride  80 mEq Oral Once  . sodium chloride  10-40 mL Intracatheter Q12H  . sodium chloride  3 mL Intravenous Q12H  . spironolactone  25 mg Oral Daily  . temazepam  30 mg Oral QHS  . vitamin E  400 Units Oral Daily    Continuous Infusions: . amiodarone 60 mg/hr (08/07/14 0136)    Past Medical History  Diagnosis Date  . CHF (congestive heart failure)     a. EF 30-35%, RV mildly dilated (difficult study 11/2013) b. RHC (12/2013): RA 38/37 (35), RV 90/28, PA 95/51 (71), PCWP 54, PA 40%, AO 96 %, CO/CI Fick  3.2/1.4, PVR 5.3   . Nonischemic cardiomyopathy 12/2013    a. LHC (12/2013) normal coronary anatomy   . Mitral regurgitation     moderate to severe  . S/P cardiac catheterization   . LBBB (left bundle branch block)     s/p [employer] CRT-D  . HTN (hypertension)   . Asthma   . IBS (irritable bowel syndrome)     with primarily constipation  . Morbid obesity   . GERD (gastroesophageal reflux disease)   . IDA (iron deficiency anemia)     2o TO SB AVMS, Hx parenteral iron Dr Mariel Sleet  . AVM (arteriovenous malformation)     Small bowel, s/p duble balloon enteroscopy/APC jejunum Dr Gwinda Passe [facility]) 01/23/2011  . Anemia, chronic disease   . Peripheral neuropathy   . OSA (obstructive sleep apnea)     on CPAP qhs  . GI bleed 06/2009    Hgb 7.5, ferritin 7, transfusion, iron IV  . Dyspnea on exertion     chronic  . Diabetes mellitus without complication   . ICD (implantable cardioverter-defibrillator), single, in situ 07/28/2014 interrogation    Pt shocked 3x since 07/20/14---pt intentionally unplugged Latitude, remote received 07/27/14 once plugged in. Pt unaware of shock on 07/27/14. Thresholds and sensing consistent with previous device measurements. Lead impedance trends stable over time.     Past Surgical History  Procedure Laterality Date  . Crdt-implantation  4/07    [employer]. remote- yes  . Appendectomy    . Cholecystectomy      biliary dyskinesia  . Mastectomy  2003    left, partial  . Knee arthroscopy  2005    left  . Small bowel enteroscopy  MAY 2012 [facility] DR. GILLIAM    SB AVMS s/p APC  . Colonoscopy  OCT 2010/SEP 2011    tortuous colon, 3 polyps-benign(2010),   . Upper gastrointestinal endoscopy  '08, '10, SEP 2011    mild antral gastritis (2010), negative SB bx (2010), incomlpete Schatzki's ring (2011)  . Small bowel enteroscopy  SEP 2011 [facility]    Fields-NO AVMS  . Givens capsule study  NOV 2010     NO AVMS, normal  . Irrigation and  debridement abscess  06/25/2012    Procedure: MINOR INCISION AND DRAINAGE OF ABSCESS;  Surgeon: [name], MD;  Location: [facility];  Service: General;  Laterality: [name];  Incision & Drainage of Infected Sebaceous Cyst on Chest  . Breast surgery    . Cardiac defibrillator placement      [facility]    [place]phaliaKatie Muranda Coye, [place], [place] Pager #: [phone] After-Hours Pager #: [phone]

## 2014-08-07 NOTE — Plan of Care (Signed)
Problem: Phase I Progression Outcomes Goal: Other Phase I Outcomes/Goals Outcome: Not Applicable Date Met:  08/07/14  Problem: Phase II Progression Outcomes Goal: Tolerating diet Outcome: Completed/Met Date Met:  08/07/14     

## 2014-08-07 NOTE — Plan of Care (Signed)
Problem: Consults Goal: Skin Care Protocol Initiated - if Braden Score 18 or less If consults are not indicated, leave blank or document [name]  Outcome: Not Applicable Date Met:  98/10/25 Goal: Tobacco Cessation referral if indicated Outcome: Not Applicable Date Met:  48/62/82 Goal: Nutrition Consult-if indicated Outcome: Not Applicable Date Met:  41/75/30 Goal: Diabetes Guidelines if Diabetic/Glucose > 140 If diabetic or lab glucose is > 140 mg/dl - Initiate Diabetes/Hyperglycemia Guidelines & Document Interventions  Outcome: Completed/Met Date Met:  08/07/14

## 2014-08-07 NOTE — Consult Note (Signed)
Advanced Heart Failure Team History and Physical Note   EP: Dr [name] PCP: Dr [name]  Reason for Admission: VF s/p multiple shocks   HPI:    [name] is a 48 yo female with a history of NICM s/p BiV ICD Conservation officer, historic buildings), chronic systolic HF, DM2, HTN, morbid obesity, VF/VT with frequent ICD shocks and OSA on CPAP.   Recently discharged 11/21 after multiple ICD shocks and CP.   Presented to the ED after multiple ICD shocks for VF and syncope. Prior to admission she reports feeling being very weak. She has been in the bed for 3 weeks on her Bipap. Says her abdomen has been swollen and took extra torsemide. Weight has been stable and has been taking medications.    PICC placed yesterday. Co-ox 64% CVP 21. Echo today read as EF 30-35% (down from 35-40%) Elevated filling pressures. RV not well seen   Had CPX in June with submaximal effort. Test limited by restrictive lung physiology. Measured pVO2 9.3 (corrected to 17.1 for ibw).  RHC/LHC 4/15 RA 38/37 (35)  RV 90/28  PA 95/51 (71)  PCWP 57/73 (54)  LV 127/50  AO 123/66 (100)  Oxygen saturations:  PA 40%  AO 96%  Cardiac Output/Index (Fick) 3.2/1.4, PVR 5.3  Normal coronary anatomy   Review of Systems: [y] = yes, [ ]  = no   General: Weight gain [ ] ; Weight loss [ ] ; Anorexia [ ] ; Fatigue [Y ]; Fever [ ] ; Chills [ ] ; Weakness [ Y]  Cardiac: Chest pain/pressure [ Y]; Resting SOB [ ] ; Exertional SOB [Y ]; Orthopnea [Y ]; Pedal Edema [ ] ; Palpitations [Y ]; Syncope [ Y]; Presyncope [ ] ; Paroxysmal nocturnal dyspnea[ ]   Pulmonary: Cough [ ] ; Wheezing[ ] ; Hemoptysis[ ] ; Sputum [ ] ; Snoring [ ]   GI: Vomiting[ ] ; Dysphagia[ ] ; Melena[ ] ; Hematochezia [ ] ; Heartburn[ ] ; Abdominal pain [ ] ; Constipation [ ] ; Diarrhea [ ] ; BRBPR [ ]   GU: Hematuria[ ] ; Dysuria [ ] ; Nocturia[ ]   Vascular: Pain in legs with walking [ ] ; Pain in feet with lying flat [ ] ; Non-healing sores [ ] ; Stroke [ ] ; TIA [ ] ; Slurred speech [ ] ;  Neuro:  Headaches[ ] ; Vertigo[ ] ; Seizures[ ] ; Paresthesias[ ] ;Blurred vision [ ] ; Diplopia [ ] ; Vision changes [ ]   Ortho/Skin: Arthritis [ ] ; Joint pain [Y ]; Muscle pain [ Y]; Joint swelling [ ] ; Back Pain [ ] ; Rash [ ]   Psych: Depression[ ] ; Anxiety[Y ]  Heme: Bleeding problems [ ] ; Clotting disorders [ ] ; Anemia [ ]   Endocrine: Diabetes [ Y]; Thyroid dysfunction[ ]   Home Medications Prior to Admission medications   Medication Sig Start Date End Date Taking? Authorizing Provider  albuterol (PROVENTIL HFA;VENTOLIN HFA) 108 (90 BASE) MCG/ACT inhaler Inhale 2 puffs into the lungs every 6 (six) hours as needed for wheezing or shortness of breath.   Yes Historical Provider, MD  albuterol (PROVENTIL) (5 MG/ML) 0.5% nebulizer solution Take 0.5 mLs (2.5 mg total) by nebulization every 4 (four) hours as needed for wheezing or shortness of breath. 11/21/12  Yes [name], MD  amiodarone (PACERONE) 200 MG tablet Take 1 tablet (200 mg total) by mouth 2 (two) times daily. 07/28/14  Yes Rhonda G Barrett, PA-C  aspirin EC 81 MG EC tablet Take 1 tablet (81 mg total) by mouth daily. 12/16/13  Yes Barbara Rud, NP  carvedilol (COREG) 6.25 MG tablet Take 1 tablet (6.25 mg total) by mouth 2 (two) times daily with  a meal. 06/15/14  Yes Dolores Pattyaniel R Bensimhon, MD  cetirizine (ZYRTEC) 10 MG tablet Take 10 mg by mouth daily as needed for allergies.    Yes Historical Provider, MD  cyclobenzaprine (FLEXERIL) 10 MG tablet Take 10 mg by mouth 3 (three) times daily as needed for muscle spasms.    Yes Historical Provider, MD  digoxin (LANOXIN) 0.125 MG tablet Take 1 tablet (0.125 mg total) by mouth daily. 05/05/14  Yes Barbara RudAli B Cosgrove, NP  docusate sodium (STOOL SOFTENER) 100 MG capsule Take 100 mg by mouth daily.    Yes Historical Provider, MD  ferrous sulfate 325 (65 FE) MG tablet Take 325 mg by mouth 3 (three) times daily.   Yes Historical Provider, MD  gabapentin (NEURONTIN) 300 MG capsule Take 600 mg by mouth 4 (four) times  daily.   Yes Historical Provider, MD  HYDROcodone-acetaminophen (NORCO) 10-325 MG per tablet Take 1 tablet by mouth every 4 (four) hours as needed for moderate pain.  11/11/13  Yes Historical Provider, MD  insulin aspart (NOVOLOG) 100 UNIT/ML injection Inject 0-20 Units into the skin 3 (three) times daily with meals. 06/28/12  Yes Milana ObeyStephen D Knowlton, MD  insulin detemir (LEVEMIR) 100 UNIT/ML injection Inject 100-110 Units into the skin at bedtime.    Yes Historical Provider, MD  ivabradine HCl (CORLANOR) 5 MG TABS tablet Take 0.5 tablets (2.5 mg total) by mouth 2 (two) times daily with a meal. 08/01/14  Yes Estela Isaiah BlakesY Hernandez Acosta, MD  losartan (COZAAR) 50 MG tablet Take 1 tablet (50 mg total) by mouth daily. 06/23/14  Yes Laurey Moralealton S McLean, MD  omeprazole (PRILOSEC) 40 MG capsule Take 1 capsule (40 mg total) by mouth daily. 03/27/13  Yes Marinus MawGregg W Taylor, MD  potassium chloride SA (K-DUR,KLOR-CON) 20 MEQ tablet Take 1 tablet (20 mEq total) by mouth 2 (two) times daily. Or as instructed when to take extra. 05/05/14  Yes Barbara RudAli B Cosgrove, NP  spironolactone (ALDACTONE) 25 MG tablet Take 1 tablet (25 mg total) by mouth daily. 04/23/14  Yes Bevelyn Bucklesaniel R Bensimhon, MD  temazepam (RESTORIL) 30 MG capsule Take 30 mg by mouth at bedtime.   Yes Historical Provider, MD  torsemide (DEMADEX) 20 MG tablet Take 3 tabs in the AM (60 mg) and take 2 tabs in the PM (40 mg) Patient taking differently: Take 40-60 mg by mouth 2 (two) times daily. Take 3 tabs in the AM (60 mg) and take 2 tabs in the PM (40 mg) 06/23/14  Yes Laurey Moralealton S McLean, MD  vitamin E 400 UNIT capsule Take 400 Units by mouth daily.   Yes Historical Provider, MD    Past Medical History: Past Medical History  Diagnosis Date  . CHF (congestive heart failure)     a. EF 30-35%, RV mildly dilated (difficult study 11/2013) b. RHC (12/2013): RA 38/37 (35), RV 90/28, PA 95/51 (71), PCWP 54, PA 40%, AO 96 %, CO/CI Fick 3.2/1.4, PVR 5.3   . Nonischemic cardiomyopathy  12/2013    a. LHC (12/2013) normal coronary anatomy   . Mitral regurgitation     moderate to severe  . S/P cardiac catheterization   . LBBB (left bundle branch block)     s/p [employer] CRT-D  . HTN (hypertension)   . Asthma   . IBS (irritable bowel syndrome)     with primarily constipation  . Morbid obesity   . GERD (gastroesophageal reflux disease)   . IDA (iron deficiency anemia)     2o TO SB  AVMS, Hx parenteral iron Dr Mariel Sleet  . AVM (arteriovenous malformation)     Small bowel, s/p duble balloon enteroscopy/APC jejunum Dr Gwinda Passe [facility]) 01/23/2011  . Anemia, chronic disease   . Peripheral neuropathy   . OSA (obstructive sleep apnea)     on CPAP qhs  . GI bleed 06/2009    Hgb 7.5, ferritin 7, transfusion, iron IV  . Dyspnea on exertion     chronic  . Diabetes mellitus without complication   . ICD (implantable cardioverter-defibrillator), single, in situ 07/28/2014 interrogation    Pt shocked 3x since 07/20/14---pt intentionally unplugged Latitude, remote received 07/27/14 once plugged in. Pt unaware of shock on 07/27/14. Thresholds and sensing consistent with previous device measurements. Lead impedance trends stable over time.     Past Surgical History: Past Surgical History  Procedure Laterality Date  . Crdt-implantation  4/07    [employer]. remote- yes  . Appendectomy    . Cholecystectomy      biliary dyskinesia  . Mastectomy  2003    left, partial  . Knee arthroscopy  2005    left  . Small bowel enteroscopy  MAY 2012 [facility] DR. GILLIAM    SB AVMS s/p APC  . Colonoscopy  OCT 2010/SEP 2011    tortuous colon, 3 polyps-benign(2010),   . Upper gastrointestinal endoscopy  '08, '10, SEP 2011    mild antral gastritis (2010), negative SB bx (2010), incomlpete Schatzki's ring (2011)  . Small bowel enteroscopy  SEP 2011 [facility]    Fields-NO AVMS  . Givens capsule study  NOV 2010     NO AVMS, normal  . Irrigation and debridement abscess  06/25/2012     Procedure: MINOR INCISION AND DRAINAGE OF ABSCESS;  Surgeon: [name], MD;  Location: [facility];  Service: General;  Laterality: [name];  Incision & Drainage of Infected Sebaceous Cyst on Chest  . Breast surgery    . Cardiac defibrillator placement      [employer]    Family History: Family History  Problem Relation Age of Onset  . Colon cancer Neg Hx     no family Hx of polyps too, uncle  . Cervical cancer Mother   . Hypertension Mother   . Cancer Mother   . Heart disease Father   . Hypertension Father   . GER disease Father   . Heart attack Father   . Bleeding Disorder Father   . Diabetes Sister   . Heart disease Sister   . Hypertension Sister     Social History: History   Social History  . Marital Status: Single    Spouse Name: [name]    Number of Children: 1  . Years of Education: [name]   Occupational History  . disabled    Social History Main Topics  . Smoking status: Former Smoker -- 0.50 packs/day for 20 years    Types: Cigarettes    Quit date: 11/19/2009  . Smokeless tobacco: Former Neurosurgeon  . Alcohol Use: No  . Drug Use: No  . Sexual Activity: Yes    Birth Control/ Protection: Implant   Other Topics Concern  . None   Social History Narrative   Disability for heart disease. Was a CNA.   Single- 1 daughter age 8.    Does not get regular exercise.      Allergies:  Allergies  Allergen Reactions  . Cymbalta [Duloxetine Hcl] Other (See Comments)    "Spontaneous type behavior"  . Trazodone And Nefazodone Other (  See Comments)    Nightmares   . Lyrica [Pregabalin] Swelling    Objective:    Vital Signs:   Temp:  [97.5 F (36.4 C)-98.4 F (36.9 C)] 98.1 F (36.7 C) (11/27 1200) Pulse Rate:  [61-78] 61 (11/27 1200) Resp:  [21-35] 21 (11/27 1200) BP: (98-110)/(60-74) 102/60 mmHg (11/27 1200) SpO2:  [92 %-100 %] 100 % (11/27 1200) Weight:  [264 lb 12.4 oz (120.1 kg)] 264 lb 12.4 oz (120.1 kg) (11/27 0500)   Filed Weights   08/05/14 1924  08/06/14 0330 08/07/14 0500  Weight: 269 lb (122.018 kg) 263 lb 3.7 oz (119.4 kg) 264 lb 12.4 oz (120.1 kg)    Physical Exam: General:  Chronically ill appearing. Morbidly obese with limited mobility No resp difficulty, husband at bedside HEENT: normal Neck: supple. JVP hard to see. Looks elevated.Carotids 2+ bilat; no bruits. No lymphadenopathy or thryomegaly appreciated. Cor: PMI nonpalpable. Regular rate & rhythm. No rubs, gallops or murmurs. Lungs: clear Abdomen: morbidly obese, soft, nontender, nondistended. No hepatosplenomegaly. No bruits or masses. Good bowel sounds. Extremities: no cyanosis, clubbing, rash, edema. warm Neuro: alert & orientedx3, cranial nerves grossly intact. moves all 4 extremities w/o difficulty. Affect pleasant  Telemetry: V paced 60s  Labs: Basic Metabolic Panel:  Recent Labs Lab 07/31/14 1335 08/04/14 2339 08/05/14 2041 08/06/14 0213 08/06/14 0359 08/06/14 0954 08/07/14 0530  NA 139 140 138 140  --  139 143  K 3.4* 3.3* 3.4* 3.4*  --  3.6* 3.6*  CL 94* 94* 95* 95*  --  94* 98  CO2 34* 33* 27 32  --  30 33*  GLUCOSE 248* 189* 260* 226*  --  248* 161*  BUN 10 13 11 11   --  10 11  CREATININE 0.90 0.85 0.90 0.95 0.84 0.90 0.90  CALCIUM 8.3* 8.6 8.1* 7.8*  --  8.3* 8.1*  MG 1.4*  --   --   --   --  1.5  --     Liver Function Tests:  Recent Labs Lab 08/04/14 2339 08/06/14 0213  AST 18 18  ALT 12 11  ALKPHOS 87 80  BILITOT 0.4 0.5  PROT 8.4* 7.1  ALBUMIN 3.5 3.1*   No results for input(s): LIPASE, AMYLASE in the last 168 hours. No results for input(s): AMMONIA in the last 168 hours.  CBC:  Recent Labs Lab 08/04/14 2339 08/05/14 2041 08/06/14 0213 08/06/14 0800  WBC 6.9 7.5 6.7 7.1  NEUTROABS 4.2  --   --   --   HGB 13.1 12.8 11.9* 13.3  HCT 39.9 39.5 37.0 40.8  MCV 90.9 90.2 90.5 92.1  PLT 269 250 242 232    Cardiac Enzymes:  Recent Labs Lab 07/31/14 1335 08/04/14 2339  TROPONINI <0.30 <0.30    BNP: BNP (last 3  results)  Recent Labs  12/08/13 1028 12/23/13 1105 08/04/14 2339  PROBNP 948.6* 248.1* 1727.0*    CBG:  Recent Labs Lab 08/06/14 1624 08/06/14 2033 08/06/14 2215 08/07/14 0811 08/07/14 1212  GLUCAP 291* 268* 244* 192* 244*    Coagulation Studies: No results for input(s): LABPROT, INR in the last 72 hours.  Other results: EKG: SR 80s  Imaging: Dg Chest Port 1 View  08/06/2014   CLINICAL DATA:  Central catheter placement  EXAM: PORTABLE CHEST - 1 VIEW  COMPARISON:  August 05, 2014  FINDINGS: Central catheter tip is in the superior vena cava. No pneumothorax. There is persistent consolidation in the left lower lobe. Elsewhere lungs are clear. Heart  is enlarged with pulmonary vascularity within normal limits. Pacemaker leads are attached to the right atrium and right ventricle. No adenopathy.  IMPRESSION: Central catheter tip in superior vena cava. No pneumothorax. Stable cardiomegaly. Stable consolidation left lower lobe. No new opacity.   Electronically Signed   By: Bretta Bang M.D.   On: 08/06/2014 15:11   Dg Chest Port 1 View  08/05/2014   CLINICAL DATA:  Syncope. Shortness of breath, dizziness. Chest discomfort.  EXAM: PORTABLE CHEST - 1 VIEW  COMPARISON:  08/04/2014  FINDINGS: Left pacer remains in place, unchanged. Cardiomegaly. No confluent opacities or effusions. No edema. No acute bony abnormality.  IMPRESSION: Cardiomegaly.  No active disease.   Electronically Signed   By: Charlett Nose M.D.   On: 08/05/2014 21:10         Assessment:   1) VFib  - s/p ICD dishcarge 2) Syncope 3) A/C systolic HF d/t NICM EF 30-35% 4) OSA - on CPAP 5) Morbid obesity  Plan/Discussion:     Length of Stay: 2 [name] 08/07/2014, 1:08 PM  Advanced Heart Failure Team Pager [phone] (M-F; 7a - 4p)  Please contact [facility] for night-coverage after hours (4p -7a ) and weekends on [email or web address]  Patient seen and examined with Ulla Potash, NP. We discussed  all aspects of the encounter. I agree with the assessment and plan as stated above.   Difficult situation. She has recurrent VF/VT in the setting of moderate LV dysfunction with elevated filling pressures. However cardiac output well preserved by co-ox. CPX this summer showed significant limitation due to her morbid obesity and related restrictive lung physiology. She is currently quite debilitated. Given her preserved cardiac output and her comorbidities she is not a candidate for advanced HF therapies at this point. We will diurese as tolerated and do our best to optimize her HF medicines. Stressed need for her to ambulate and prevent further deconditioning. Management of VT/VF per EP.  We will follow.   Daniel Bensimhon,MD 1:33 PM

## 2014-08-07 NOTE — Progress Notes (Signed)
Drew and sent Carboxyhemoglobin lab and tubed to respiratory to run

## 2014-08-07 NOTE — Progress Notes (Signed)
Patient Name: [name]      SUBJECTIVE: nonsichemic cardiomyiopathy, last EF 35 3/15 with RHC CI 1.4 and RA pressures 30 S/p CRT  Syncopal VT a few weeks ago, declined admission and amio started as outpt.  Thought also to be in HF Recurrent syncope last pm>>ICD--VT.  She feels distended but interestingly weight is relatively stable over recent weeks, but she has felt terrible and been in bed for most of three weeks.  No VT overnight on IV amio;  Improved diuresis  Much improved this am with less SOB and less anxiety  She is scared  She has a 48 yo daugther     Past Medical History  Diagnosis Date  . CHF (congestive heart failure)     a. EF 30-35%, RV mildly dilated (difficult study 11/2013) b. RHC (12/2013): RA 38/37 (35), RV 90/28, PA 95/51 (71), PCWP 54, PA 40%, AO 96 %, CO/CI Fick 3.2/1.4, PVR 5.3   . Nonischemic cardiomyopathy 12/2013    a. LHC (12/2013) normal coronary anatomy   . Mitral regurgitation     moderate to severe  . S/P cardiac catheterization   . LBBB (left bundle branch block)     s/p [employer] CRT-D  . HTN (hypertension)   . Asthma   . IBS (irritable bowel syndrome)     with primarily constipation  . Morbid obesity   . GERD (gastroesophageal reflux disease)   . IDA (iron deficiency anemia)     2o TO SB AVMS, Hx parenteral iron Dr Mariel Sleet  . AVM (arteriovenous malformation)     Small bowel, s/p duble balloon enteroscopy/APC jejunum Dr Gwinda Passe [facility]) 01/23/2011  . Anemia, chronic disease   . Peripheral neuropathy   . OSA (obstructive sleep apnea)     on CPAP qhs  . GI bleed 06/2009    Hgb 7.5, ferritin 7, transfusion, iron IV  . Dyspnea on exertion     chronic  . Diabetes mellitus without complication   . ICD (implantable cardioverter-defibrillator), single, in situ 07/28/2014 interrogation    Pt shocked 3x since 07/20/14---pt intentionally unplugged Latitude, remote received 07/27/14 once plugged in. Pt unaware of shock on  07/27/14. Thresholds and sensing consistent with previous device measurements. Lead impedance trends stable over time.     Scheduled Meds:  Scheduled Meds: . aspirin EC  81 mg Oral Daily  . carvedilol  6.25 mg Oral BID WC  . digoxin  0.125 mg Oral Daily  . docusate sodium  100 mg Oral Daily  . enoxaparin (LOVENOX) injection  60 mg Subcutaneous Q24H  . ferrous sulfate  325 mg Oral TID  . furosemide  120 mg Intravenous Q8H  . gabapentin  600 mg Oral QID  . insulin aspart  0-20 Units Subcutaneous TID WC  . insulin aspart  0-5 Units Subcutaneous QHS  . insulin detemir  100 Units Subcutaneous QHS  . ivabradine  2.5 mg Oral BID WC  . losartan  50 mg Oral Daily  . pantoprazole  40 mg Oral Daily  . potassium chloride SA  40 mEq Oral BID  . potassium chloride  80 mEq Oral Once  . sodium chloride  10-40 mL Intracatheter Q12H  . sodium chloride  3 mL Intravenous Q12H  . spironolactone  25 mg Oral Daily  . temazepam  30 mg Oral QHS  . vitamin E  400 Units Oral Daily   Continuous Infusions: . amiodarone 60 mg/hr (08/07/14 0136)   sodium  chloride, acetaminophen, albuterol, amiodarone, cyclobenzaprine, HYDROmorphone, LORazepam, ondansetron (ZOFRAN) IV, sodium chloride, sodium chloride    PHYSICAL EXAM Filed Vitals:   08/07/14 0000 08/07/14 0400 08/07/14 0500 08/07/14 0800  BP: 99/66 98/69  105/66  Pulse: 63 62  68  Temp: 98 F (36.7 C) 97.8 F (36.6 C)  98.4 F (36.9 C)  TempSrc: Oral Oral  Oral  Resp: 22 24  24   Height:      Weight:   264 lb 12.4 oz (120.1 kg)   SpO2: 92% 99%  100%    General appearance: alert, no major  distress and less anxious Neck: supple, symmetrical, trachea midline and unable to discernneck veins Lungs: clear to auscultation bilaterally Heart: regular rate and rhythm, S1, S2 normal, no murmur, click, rub or gallop Abdomen: soft, non-tender; bowel sounds normal; no masses,  no organomegaly Extremities: extremities normal, atraumatic, no cyanosis  tr  edema Skin: Skin color, texture, turgor normal. No rashes or lesions Neurologic: Grossly normal  TELEMETRY: Reviewed telemetry pt in P-synchronous/ AV  pacing with few PVC:    Intake/Output Summary (Last 24 hours) at 08/07/14 0826 Last data filed at 08/07/14 3832  Gross per 24 hour  Intake 1144.48 ml  Output   3000 ml  Net -1855.52 ml    LABS: Basic Metabolic Panel:  Recent Labs Lab 07/31/14 1335 08/04/14 2339 08/05/14 2041 08/06/14 0213 08/06/14 0359 08/06/14 0954 08/07/14 0530  NA 139 140 138 140  --  139 143  K 3.4* 3.3* 3.4* 3.4*  --  3.6* 3.6*  CL 94* 94* 95* 95*  --  94* 98  CO2 34* 33* 27 32  --  30 33*  GLUCOSE 248* 189* 260* 226*  --  248* 161*  BUN 10 13 11 11   --  10 11  CREATININE 0.90 0.85 0.90 0.95 0.84 0.90 0.90  CALCIUM 8.3* 8.6 8.1* 7.8*  --  8.3* 8.1*  MG 1.4*  --   --   --   --  1.5  --    Cardiac Enzymes:  Recent Labs  08/04/14 2339  TROPONINI <0.30   CBC:  Recent Labs Lab 08/04/14 2339 08/05/14 2041 08/06/14 0213 08/06/14 0800  WBC 6.9 7.5 6.7 7.1  NEUTROABS 4.2  --   --   --   HGB 13.1 12.8 11.9* 13.3  HCT 39.9 39.5 37.0 40.8  MCV 90.9 90.2 90.5 92.1  PLT 269 250 242 232   PROTIME: No results for input(s): LABPROT, INR in the last 72 hours. Liver Function Tests:  Recent Labs  08/04/14 2339 08/06/14 0213  AST 18 18  ALT 12 11  ALKPHOS 87 80  BILITOT 0.4 0.5  PROT 8.4* 7.1  ALBUMIN 3.5 3.1*   No results for input(s): LIPASE, AMYLASE in the last 72 hours. BNP: BNP (last 3 results)  Recent Labs  12/08/13 1028 12/23/13 1105 08/04/14 2339  PROBNP 948.6* 248.1* 1727.0*   D-Dimer: No results for input(s): DDIMER in the last 72 hours. Hemoglobin A1C:   Device Interrogation pending  ASSESSMENT AND PLAN:  Active Problems:   Automatic implantable cardioverter-defibrillator in situ   OSA on CPAP   Acute combined systolic and diastolic heart failure   Paroxysmal ventricular tachycardia   I am perplexed at  the overall functional deteriortation in the absence of weight gain.  Her pressures were exceedingly high at [facility] 3/15 so...  Her electrolytes are pretty good, and wonder if the flurry of VT isnt related to electromechanical interactions.  Overall  i am concerned at her prognosis.    Will continue aggressive IV diuresis  Now responding clinically and with I/O>2L COnitnue IV amio overnight and change to po in am if rhytm quiet CHF team to see today     Signed, Sherryl MangesSteven Klein MD  08/07/2014

## 2014-08-08 ENCOUNTER — Inpatient Hospital Stay (HOSPITAL_COMMUNITY): Payer: Medicare Other

## 2014-08-08 DIAGNOSIS — M25562 Pain in left knee: Secondary | ICD-10-CM

## 2014-08-08 DIAGNOSIS — I5041 Acute combined systolic (congestive) and diastolic (congestive) heart failure: Secondary | ICD-10-CM

## 2014-08-08 LAB — BASIC METABOLIC PANEL
Anion gap: 8 (ref 5–15)
BUN: 11 mg/dL (ref 6–23)
CO2: 35 mEq/L — ABNORMAL HIGH (ref 19–32)
CREATININE: 0.89 mg/dL (ref 0.50–1.10)
Calcium: 7.9 mg/dL — ABNORMAL LOW (ref 8.4–10.5)
Chloride: 98 mEq/L (ref 96–112)
GFR calc Af Amer: 87 mL/min — ABNORMAL LOW (ref >90)
GFR, EST NON AFRICAN AMERICAN: 75 mL/min — AB (ref >90)
GLUCOSE: 148 mg/dL — AB (ref 70–99)
POTASSIUM: 3.8 meq/L (ref 3.7–5.3)
Sodium: 141 mEq/L (ref 137–147)

## 2014-08-08 LAB — GLUCOSE, CAPILLARY
GLUCOSE-CAPILLARY: 160 mg/dL — AB (ref 70–99)
Glucose-Capillary: 226 mg/dL — ABNORMAL HIGH (ref 70–99)
Glucose-Capillary: 189 mg/dL — ABNORMAL HIGH (ref 70–99)
Glucose-Capillary: 197 mg/dL — ABNORMAL HIGH (ref 70–99)

## 2014-08-08 LAB — CARBOXYHEMOGLOBIN
Carboxyhemoglobin: 1.3 % (ref 0.5–1.5)
Methemoglobin: 1 % (ref 0.0–1.5)
O2 Saturation: 51.6 %
Total hemoglobin: 12.1 g/dL (ref 12.0–16.0)

## 2014-08-08 MED ORDER — SPIRONOLACTONE 25 MG PO TABS
25.0000 mg | ORAL_TABLET | Freq: Every day | ORAL | Status: DC
  Administered 2014-08-09 – 2014-08-14 (×6): 25 mg via ORAL
  Filled 2014-08-08 (×6): qty 1

## 2014-08-08 MED ORDER — WHITE PETROLATUM GEL
Status: AC
  Administered 2014-08-08: 0.2
  Filled 2014-08-08: qty 5

## 2014-08-08 MED ORDER — METOLAZONE 2.5 MG PO TABS
2.5000 mg | ORAL_TABLET | Freq: Two times a day (BID) | ORAL | Status: DC
  Administered 2014-08-08 – 2014-08-10 (×4): 2.5 mg via ORAL
  Filled 2014-08-08 (×6): qty 1

## 2014-08-08 MED ORDER — AMIODARONE HCL 200 MG PO TABS
400.0000 mg | ORAL_TABLET | Freq: Three times a day (TID) | ORAL | Status: DC
  Administered 2014-08-08 – 2014-08-14 (×18): 400 mg via ORAL
  Filled 2014-08-08 (×21): qty 2

## 2014-08-08 MED ORDER — METOLAZONE 2.5 MG PO TABS
2.5000 mg | ORAL_TABLET | Freq: Every day | ORAL | Status: AC
  Administered 2014-08-08: 2.5 mg via ORAL
  Filled 2014-08-08: qty 1

## 2014-08-08 MED ORDER — LOSARTAN POTASSIUM 50 MG PO TABS
75.0000 mg | ORAL_TABLET | Freq: Every day | ORAL | Status: DC
  Administered 2014-08-09: 75 mg via ORAL
  Filled 2014-08-08 (×3): qty 1

## 2014-08-08 MED ORDER — FUROSEMIDE 10 MG/ML IJ SOLN
120.0000 mg | Freq: Three times a day (TID) | INTRAVENOUS | Status: DC
  Administered 2014-08-08 – 2014-08-10 (×5): 120 mg via INTRAVENOUS
  Filled 2014-08-08 (×7): qty 12

## 2014-08-08 MED ORDER — FUROSEMIDE 10 MG/ML IJ SOLN: 80.0000 mg | Freq: Three times a day (TID) | INTRAMUSCULAR | Status: DC

## 2014-08-08 NOTE — Progress Notes (Addendum)
. Advanced Heart Failure Rounding Note   Subjective:    [name] is a 48 yo female with a history of NICM s/p BiV ICD [employer], historic buildings), chronic systolic HF, DM2, HTN, morbid obesity, VF/VT with frequent ICD shocks and OSA on CPAP.   Recently discharged 11/21 after multiple ICD shocks and CP.   Presented to the ED after multiple ICD shocks for VF and syncope.   PICC placed 11/26. Co-ox 64% CVP 21. Echo EF 30-35% (down from 35-40%) Elevated filling pressures. RV not well seen  Had CPX in June with submaximal effort. Test limited by restrictive lung physiology. Measured pVO2 9.3 (corrected to 17.1 for ibw).  No further VT. AV paced on tele. I/O even  weight up two pounds (done as bed weight)   Says she is breathing better. CVP down to 14-15. Co-ox now 52%    Objective:   Weight Range:  Vital Signs:   Temp:  [97.9 F (36.6 C)-98.6 F (37 C)] 98.6 F (37 C) (11/28 1154) Pulse Rate:  [59-68] 60 (11/28 1200) Resp:  [13-26] 21 (11/28 1200) BP: (94-123)/(58-79) 100/60 mmHg (11/28 1154) SpO2:  [92 %-100 %] 92 % (11/28 1154) Weight:  [120.8 kg (266 lb 5.1 oz)] 120.8 kg (266 lb 5.1 oz) (11/28 0415)    Weight change: Filed Weights   08/06/14 0330 08/07/14 0500 08/08/14 0415  Weight: 119.4 kg (263 lb 3.7 oz) 120.1 kg (264 lb 12.4 oz) 120.8 kg (266 lb 5.1 oz)    Intake/Output:   Intake/Output Summary (Last 24 hours) at 08/08/14 1242 Last data filed at 08/08/14 0937  Gross per 24 hour  Intake 1830.36 ml  Output   2300 ml  Net -469.64 ml     Physical Exam: General: Chronically ill appearing. Morbidly obese with limited mobility No resp difficulty, husband at bedside HEENT: normal Neck: supple. JVP hard to see. Looks elevated.Carotids 2+ bilat; no bruits. No lymphadenopathy or thryomegaly appreciated. Cor: PMI nonpalpable. Regular rate & rhythm. No rubs, gallops or murmurs. Lungs: clear Abdomen: morbidly obese, soft, nontender, nondistended. No hepatosplenomegaly. No  bruits or masses. Good bowel sounds. Extremities: no cyanosis, clubbing, rash, edema. warm Neuro: alert & orientedx3, cranial nerves grossly intact. moves all 4 extremities w/o difficulty. Affect pleasant  Telemetry: AV paced 60s  Labs: Basic Metabolic Panel:  Recent Labs Lab 08/05/14 2041 08/06/14 0213 08/06/14 0359 08/06/14 0954 08/07/14 0530 08/08/14 0538  NA 138 140  --  139 143 141  K 3.4* 3.4*  --  3.6* 3.6* 3.8  CL 95* 95*  --  94* 98 98  CO2 27 32  --  30 33* 35*  GLUCOSE 260* 226*  --  248* 161* 148*  BUN 11 11  --  10 11 11   CREATININE 0.90 0.95 0.84 0.90 0.90 0.89  CALCIUM 8.1* 7.8*  --  8.3* 8.1* 7.9*  MG  --   --   --  1.5  --   --     Liver Function Tests:  Recent Labs Lab 08/04/14 2339 08/06/14 0213  AST 18 18  ALT 12 11  ALKPHOS 87 80  BILITOT 0.4 0.5  PROT 8.4* 7.1  ALBUMIN 3.5 3.1*   No results for input(s): LIPASE, AMYLASE in the last 168 hours. No results for input(s): AMMONIA in the last 168 hours.  CBC:  Recent Labs Lab 08/04/14 2339 08/05/14 2041 08/06/14 0213 08/06/14 0800  WBC 6.9 7.5 6.7 7.1  NEUTROABS 4.2  --   --   --  HGB 13.1 12.8 11.9* 13.3  HCT 39.9 39.5 37.0 40.8  MCV 90.9 90.2 90.5 92.1  PLT 269 250 242 232    Cardiac Enzymes:  Recent Labs Lab 08/04/14 2339  TROPONINI <0.30    BNP: BNP (last 3 results)  Recent Labs  12/08/13 1028 12/23/13 1105 08/04/14 2339  PROBNP 948.6* 248.1* 1727.0*     Other results:    Imaging: Dg Chest Port 1 View  08/06/2014   CLINICAL DATA:  Central catheter placement  EXAM: PORTABLE CHEST - 1 VIEW  COMPARISON:  August 05, 2014  FINDINGS: Central catheter tip is in the superior vena cava. No pneumothorax. There is persistent consolidation in the left lower lobe. Elsewhere lungs are clear. Heart is enlarged with pulmonary vascularity within normal limits. Pacemaker leads are attached to the right atrium and right ventricle. No adenopathy.  IMPRESSION: Central catheter  tip in superior vena cava. No pneumothorax. Stable cardiomegaly. Stable consolidation left lower lobe. No new opacity.   Electronically Signed   By: Bretta BangWilliam  Woodruff M.D.   On: 08/06/2014 15:11      Medications:     Scheduled Medications: . amiodarone  400 mg Oral 3 times per day  . aspirin EC  81 mg Oral Daily  . carvedilol  6.25 mg Oral BID WC  . digoxin  0.125 mg Oral Daily  . docusate sodium  100 mg Oral Daily  . enoxaparin (LOVENOX) injection  60 mg Subcutaneous Q24H  . feeding supplement (GLUCERNA SHAKE)  237 mL Oral Q1500  . ferrous sulfate  325 mg Oral TID  . furosemide  120 mg Intravenous Q8H  . gabapentin  600 mg Oral QID  . insulin aspart  0-20 Units Subcutaneous TID WC  . insulin aspart  0-5 Units Subcutaneous QHS  . insulin detemir  100 Units Subcutaneous QHS  . ivabradine  2.5 mg Oral BID WC  . losartan  50 mg Oral Daily  . pantoprazole  40 mg Oral Daily  . potassium chloride SA  40 mEq Oral BID  . potassium chloride  80 mEq Oral Once  . sodium chloride  10-40 mL Intracatheter Q12H  . sodium chloride  3 mL Intravenous Q12H  . spironolactone  25 mg Oral Daily  . temazepam  30 mg Oral QHS  . vitamin E  400 Units Oral Daily  . white petrolatum         Infusions: . amiodarone 60 mg/hr (08/08/14 1200)     PRN Medications:  sodium chloride, acetaminophen, albuterol, cyclobenzaprine, HYDROmorphone, LORazepam, ondansetron (ZOFRAN) IV, sodium chloride, sodium chloride   Assessment:   1) VFib  - s/p ICD dishcarge 2) Syncope 3) A/C systolic HF d/t NICM EF 30-35% 4) OSA - on CPAP 5) Morbid obesity 6  Plan/Discussion:    Will continue IV diuresis. Add metolazone. Co-ox now borderline and may reflect her true cardiac output state. Will attempt to increase losartan to 75 mg daily as BP tolerates.   Continue to follow CVP and co-ox. Supp K+   Length of Stay: 3   Arvilla Meresaniel Bensimhon MD 08/08/2014, 12:42 PM  Advanced Heart Failure Team Pager [phone]  (M-F; 7a - 4p)  Please contact [facility] for night-coverage after hours (4p -7a ) and weekends on [email or web address]

## 2014-08-08 NOTE — Progress Notes (Signed)
Pt experienced bigeminy PVC's witnessed at nursing station.  Pt denied complaints.  MD has been notified.

## 2014-08-08 NOTE — Progress Notes (Signed)
Patient Name: [name]      SUBJECTIVE: nonsichemic cardiomyiopathy, last EF 35 3/15 with RHC CI 1.4 and RA pressures 30 S/p CRT  Syncopal VT a few weeks ago, declined admission and amio started as outpt.  Thought also to be in HF Recurrent syncope last pm>>ICD--VT.  She feels distended but interestingly weight is relatively stable over recent weeks, but she has felt terrible and been in bed for most of three weeks.  No VT overnight on IV amio;  Improved diuresis  Much improved this am with less SOB and less anxiety  She is scared  She has a 48 yo daugther     Past Medical History  Diagnosis Date  . CHF (congestive heart failure)     a. EF 30-35%, RV mildly dilated (difficult study 11/2013) b. RHC (12/2013): RA 38/37 (35), RV 90/28, PA 95/51 (71), PCWP 54, PA 40%, AO 96 %, CO/CI Fick 3.2/1.4, PVR 5.3   . Nonischemic cardiomyopathy 12/2013    a. LHC (12/2013) normal coronary anatomy   . Mitral regurgitation     moderate to severe  . S/P cardiac catheterization   . LBBB (left bundle branch block)     s/p [employer] CRT-D  . HTN (hypertension)   . Asthma   . IBS (irritable bowel syndrome)     with primarily constipation  . Morbid obesity   . GERD (gastroesophageal reflux disease)   . IDA (iron deficiency anemia)     2o TO SB AVMS, Hx parenteral iron Dr Mariel SleetNeijstrom  . AVM (arteriovenous malformation)     Small bowel, s/p duble balloon enteroscopy/APC jejunum Dr Gwinda [facility]seGilliam [facility] 01/23/2011  . Anemia, chronic disease   . Peripheral neuropathy   . OSA (obstructive sleep apnea)     on CPAP qhs  . GI bleed 06/2009    Hgb 7.5, ferritin 7, transfusion, iron IV  . Dyspnea on exertion     chronic  . Diabetes mellitus without complication   . ICD (implantable cardioverter-defibrillator), single, in situ 07/28/2014 interrogation    Pt shocked 3x since 07/20/14---pt intentionally unplugged Latitude, remote received 07/27/14 once plugged in. Pt unaware of shock on  07/27/14. Thresholds and sensing consistent with previous device measurements. Lead impedance trends stable over time.     Scheduled Meds:  Scheduled Meds: . aspirin EC  81 mg Oral Daily  . carvedilol  6.25 mg Oral BID WC  . digoxin  0.125 mg Oral Daily  . docusate sodium  100 mg Oral Daily  . enoxaparin (LOVENOX) injection  60 mg Subcutaneous Q24H  . feeding supplement (GLUCERNA SHAKE)  237 mL Oral Q1500  . ferrous sulfate  325 mg Oral TID  . furosemide  120 mg Intravenous Q8H  . gabapentin  600 mg Oral QID  . insulin aspart  0-20 Units Subcutaneous TID WC  . insulin aspart  0-5 Units Subcutaneous QHS  . insulin detemir  100 Units Subcutaneous QHS  . ivabradine  2.5 mg Oral BID WC  . losartan  50 mg Oral Daily  . pantoprazole  40 mg Oral Daily  . potassium chloride SA  40 mEq Oral BID  . potassium chloride  80 mEq Oral Once  . sodium chloride  10-40 mL Intracatheter Q12H  . sodium chloride  3 mL Intravenous Q12H  . spironolactone  25 mg Oral Daily  . temazepam  30 mg Oral QHS  . vitamin E  400 Units Oral Daily  Continuous Infusions: . amiodarone 60 mg/hr (08/08/14 1100)   sodium chloride, acetaminophen, albuterol, amiodarone, cyclobenzaprine, HYDROmorphone, LORazepam, ondansetron (ZOFRAN) IV, sodium chloride, sodium chloride    PHYSICAL EXAM Filed Vitals:   08/08/14 0946 08/08/14 1000 08/08/14 1015 08/08/14 1100  BP:  95/58 94/60   Pulse: 60 60 60 61  Temp:      TempSrc:      Resp: 17 18 15    Height:      Weight:      SpO2:        General appearance: alert, no major  distress and less anxious Neck: supple, symmetrical, trachea midline and unable to discernneck veins Lungs: clear to auscultation bilaterally Heart: regular rate and rhythm, S1, S2 normal, no murmur, click, rub or gallop Abdomen: soft, non-tender; bowel sounds normal; no masses,  no organomegaly Extremities: extremities normal, atraumatic, no cyanosis  tr edema Skin: Skin color, texture, turgor  normal. No rashes or lesions Neurologic: Grossly normal  TELEMETRY: Reviewed telemetry pt in P-synchronous/ AV  pacing with few PVC:    Intake/Output Summary (Last 24 hours) at 08/08/14 1121 Last data filed at 08/08/14 0937  Gross per 24 hour  Intake 2025.66 ml  Output   2500 ml  Net -474.34 ml    LABS: Basic Metabolic Panel:  Recent Labs Lab 08/04/14 2339 08/05/14 2041 08/06/14 0213 08/06/14 0359 08/06/14 0954 08/07/14 0530 08/08/14 0538  NA 140 138 140  --  139 143 141  K 3.3* 3.4* 3.4*  --  3.6* 3.6* 3.8  CL 94* 95* 95*  --  94* 98 98  CO2 33* 27 32  --  30 33* 35*  GLUCOSE 189* 260* 226*  --  248* 161* 148*  BUN 13 11 11   --  10 11 11   CREATININE 0.85 0.90 0.95 0.84 0.90 0.90 0.89  CALCIUM 8.6 8.1* 7.8*  --  8.3* 8.1* 7.9*  MG  --   --   --   --  1.5  --   --    Cardiac Enzymes: No results for input(s): CKTOTAL, CKMB, CKMBINDEX, TROPONINI in the last 72 hours. CBC:  Recent Labs Lab 08/04/14 2339 08/05/14 2041 08/06/14 0213 08/06/14 0800  WBC 6.9 7.5 6.7 7.1  NEUTROABS 4.2  --   --   --   HGB 13.1 12.8 11.9* 13.3  HCT 39.9 39.5 37.0 40.8  MCV 90.9 90.2 90.5 92.1  PLT 269 250 242 232   PROTIME: No results for input(s): LABPROT, INR in the last 72 hours. Liver Function Tests:  Recent Labs  08/06/14 0213  AST 18  ALT 11  ALKPHOS 80  BILITOT 0.5  PROT 7.1  ALBUMIN 3.1*   No results for input(s): LIPASE, AMYLASE in the last 72 hours. BNP: BNP (last 3 results)  Recent Labs  12/08/13 1028 12/23/13 1105 08/04/14 2339  PROBNP 948.6* 248.1* 1727.0*   D-Dimer: No results for input(s): DDIMER in the last 72 hours. Hemoglobin A1C:   Device Interrogation pending  ASSESSMENT AND PLAN:  Active Problems:   Automatic implantable cardioverter-defibrillator in situ   OSA on CPAP   Acute combined systolic and diastolic heart failure   Paroxysmal ventricular tachycardia knee and ankle pain  I am perplexed at the overall functional  deteriortation in the absence of weight gain.  Her pressures were exceedingly high at [facility] 3/15 so...  Her electrolytes are pretty good, and wonder if the flurry of VT isnt related to electromechanical interactions.  Overall i am concerned at  her prognosis.    Will continue aggressive IV diuresis  Will decreae intake  Its hard to exceed 2L IV amio >>PO GFluid restrict  Xray knee and ankle  y     Signed, Sherryl Manges MD  08/08/2014

## 2014-08-08 NOTE — Plan of Care (Signed)
Problem: Phase I Progression Outcomes Goal: Pain controlled with appropriate interventions Outcome: Progressing Goal: Hemodynamically stable Outcome: Progressing     

## 2014-08-08 NOTE — Progress Notes (Addendum)
1040:  BP trending soft, PA text paged.  Per PA nurse should hold 1000 Cozaar and administer Spironolactone as ordered, continuation with Amiodarone at 33.3 ml/hr at this time   0826:  Contacted MD to gain clarity regarding continuous Amiodarone rate.  MD called nurse back and instructed nurse to continue Amiodarone drip at stated rate at this time, also instructed nurse to administer scheduled BP meds during 0800 as ordered with current vitals.

## 2014-08-09 DIAGNOSIS — E876 Hypokalemia: Secondary | ICD-10-CM

## 2014-08-09 LAB — GLUCOSE, CAPILLARY
Glucose-Capillary: 215 mg/dL — ABNORMAL HIGH (ref 70–99)
Glucose-Capillary: 197 mg/dL — ABNORMAL HIGH (ref 70–99)
Glucose-Capillary: 202 mg/dL — ABNORMAL HIGH (ref 70–99)
Glucose-Capillary: 251 mg/dL — ABNORMAL HIGH (ref 70–99)

## 2014-08-09 LAB — CARBOXYHEMOGLOBIN
Carboxyhemoglobin: 1.9 % — ABNORMAL HIGH (ref 0.5–1.5)
Methemoglobin: 0.9 % (ref 0.0–1.5)
O2 SAT: 77.8 %
Total hemoglobin: 11.8 g/dL — ABNORMAL LOW (ref 12.0–16.0)

## 2014-08-09 LAB — BASIC METABOLIC PANEL
Anion gap: 10 (ref 5–15)
BUN: 14 mg/dL (ref 6–23)
CHLORIDE: 88 meq/L — AB (ref 96–112)
CO2: 35 mEq/L — ABNORMAL HIGH (ref 19–32)
Calcium: 8 mg/dL — ABNORMAL LOW (ref 8.4–10.5)
Creatinine, Ser: 0.86 mg/dL (ref 0.50–1.10)
GFR, EST NON AFRICAN AMERICAN: 79 mL/min — AB (ref >90)
Glucose, Bld: 347 mg/dL — ABNORMAL HIGH (ref 70–99)
Potassium: 3.3 mEq/L — ABNORMAL LOW (ref 3.7–5.3)
Sodium: 133 mEq/L — ABNORMAL LOW (ref 137–147)

## 2014-08-09 MED ORDER — TRAMADOL HCL 50 MG PO TABS
50.0000 mg | ORAL_TABLET | Freq: Four times a day (QID) | ORAL | Status: DC | PRN
  Administered 2014-08-09 – 2014-08-10 (×3): 50 mg via ORAL
  Filled 2014-08-09 (×3): qty 1

## 2014-08-09 MED ORDER — POTASSIUM CHLORIDE CRYS ER 20 MEQ PO TBCR
40.0000 meq | EXTENDED_RELEASE_TABLET | Freq: Once | ORAL | Status: AC
  Administered 2014-08-09: 40 meq via ORAL
  Filled 2014-08-09: qty 2

## 2014-08-09 MED ORDER — POTASSIUM CHLORIDE CRYS ER 20 MEQ PO TBCR
60.0000 meq | EXTENDED_RELEASE_TABLET | Freq: Two times a day (BID) | ORAL | Status: DC
  Administered 2014-08-09 – 2014-08-11 (×6): 60 meq via ORAL
  Filled 2014-08-09 (×8): qty 3

## 2014-08-09 NOTE — Plan of Care (Signed)
Problem: Phase I Progression Outcomes Goal: Pain controlled with appropriate interventions Outcome: Completed/Met Date Met:  08/09/14 TRAMADOL RELIEVES GENERALIZED ACHING FROM FALL PTA

## 2014-08-09 NOTE — Progress Notes (Signed)
Patient Name: [name]      SUBJECTIVE: nonsichemic cardiomyiopathy, last EF 35 3/15 with RHC CI 1.4 and RA pressures 30 S/p CRT  Syncopal VT a few weeks ago, declined admission and amio started as outpt.  Thought also to be in HF Recurrent syncope last pm>>ICD--VT.  She feels distended but interestingly weight is relatively stable over recent weeks, but she has felt terrible and been in bed for most of three weeks.  No VT overnight on IV amio;  There has been some ectopy but K is low    Improved diuresis  Much improved this am with less SOB   She is scared  She has a 48 yo daugther     Past Medical History  Diagnosis Date  . CHF (congestive heart failure)     a. EF 30-35%, RV mildly dilated (difficult study 11/2013) b. RHC (12/2013): RA 38/37 (35), RV 90/28, PA 95/51 (71), PCWP 54, PA 40%, AO 96 %, CO/CI Fick 3.2/1.4, PVR 5.3   . Nonischemic cardiomyopathy 12/2013    a. LHC (12/2013) normal coronary anatomy   . Mitral regurgitation     moderate to severe  . S/P cardiac catheterization   . LBBB (left bundle branch block)     s/p [employer] CRT-D  . HTN (hypertension)   . Asthma   . IBS (irritable bowel syndrome)     with primarily constipation  . Morbid obesity   . GERD (gastroesophageal reflux disease)   . IDA (iron deficiency anemia)     2o TO SB AVMS, Hx parenteral iron Dr Mariel Sleet  . AVM (arteriovenous malformation)     Small bowel, s/p duble balloon enteroscopy/APC jejunum Dr Gwinda Passe [facility]) 01/23/2011  . Anemia, chronic disease   . Peripheral neuropathy   . OSA (obstructive sleep apnea)     on CPAP qhs  . GI bleed 06/2009    Hgb 7.5, ferritin 7, transfusion, iron IV  . Dyspnea on exertion     chronic  . Diabetes mellitus without complication   . ICD (implantable cardioverter-defibrillator), single, in situ 07/28/2014 interrogation    Pt shocked 3x since 07/20/14---pt intentionally unplugged Latitude, remote received 07/27/14 once  plugged in. Pt unaware of shock on 07/27/14. Thresholds and sensing consistent with previous device measurements. Lead impedance trends stable over time.     Scheduled Meds:  Scheduled Meds: . amiodarone  400 mg Oral 3 times per day  . aspirin EC  81 mg Oral Daily  . carvedilol  6.25 mg Oral BID WC  . digoxin  0.125 mg Oral Daily  . docusate sodium  100 mg Oral Daily  . enoxaparin (LOVENOX) injection  60 mg Subcutaneous Q24H  . feeding supplement (GLUCERNA SHAKE)  237 mL Oral Q1500  . ferrous sulfate  325 mg Oral TID  . furosemide  120 mg Intravenous Q8H  . gabapentin  600 mg Oral QID  . insulin aspart  0-20 Units Subcutaneous TID WC  . insulin aspart  0-5 Units Subcutaneous QHS  . insulin detemir  100 Units Subcutaneous QHS  . ivabradine  2.5 mg Oral BID WC  . losartan  75 mg Oral Daily  . metolazone  2.5 mg Oral BID WC  . pantoprazole  40 mg Oral Daily  . potassium chloride SA  60 mEq Oral BID  . sodium chloride  10-40 mL Intracatheter Q12H  . sodium chloride  3 mL Intravenous Q12H  . spironolactone  25 mg  Oral Daily  . temazepam  30 mg Oral QHS  . vitamin E  400 Units Oral Daily   Continuous Infusions:   sodium chloride, acetaminophen, albuterol, cyclobenzaprine, HYDROmorphone, LORazepam, ondansetron (ZOFRAN) IV, sodium chloride, sodium chloride, traMADol    PHYSICAL EXAM Filed Vitals:   08/08/14 2232 08/09/14 0023 08/09/14 0401 08/09/14 0853  BP:  105/75 104/56 99/71  Pulse: 67 66 62 70  Temp:  97.9 F (36.6 C) 98 F (36.7 C) 98 F (36.7 C)  TempSrc:  Axillary Axillary Oral  Resp: 19 22 23 20   Height:      Weight:   259 lb 14.8 oz (117.9 kg)   SpO2: 97% 95% 95% 91%    General appearance: alert, no major  distress and less anxious Neck: supple, symmetrical, trachea midline and unable to discernneck veins Lungs: clear to auscultation bilaterally Heart: regular rate and rhythm, S1, S2 normal, no murmur, click, rub or gallop Abdomen: soft, non-tender; bowel  sounds normal; no masses,  no organomegaly Extremities: extremities normal, atraumatic, no cyanosis  tr edema Skin: Skin color, texture, turgor normal. No rashes or lesions Neurologic: Grossly normal  TELEMETRY: Reviewed telemetry pt in P-synchronous/ AV  pacing with few PVC:    Intake/Output Summary (Last 24 hours) at 08/09/14 0912 Last data filed at 08/09/14 0600  Gross per 24 hour  Intake 2326.6 ml  Output   3300 ml  Net -973.4 ml    LABS: Basic Metabolic Panel:  Recent Labs Lab 08/04/14 2339 08/05/14 2041 08/06/14 0213 08/06/14 0359 08/06/14 0954 08/07/14 0530 08/08/14 0538 08/09/14 0500  NA 140 138 140  --  139 143 141 133*  K 3.3* 3.4* 3.4*  --  3.6* 3.6* 3.8 3.3*  CL 94* 95* 95*  --  94* 98 98 88*  CO2 33* 27 32  --  30 33* 35* 35*  GLUCOSE 189* 260* 226*  --  248* 161* 148* 347*  BUN 13 11 11   --  10 11 11 14   CREATININE 0.85 0.90 0.95 0.84 0.90 0.90 0.89 0.86  CALCIUM 8.6 8.1* 7.8*  --  8.3* 8.1* 7.9* 8.0*  MG  --   --   --   --  1.5  --   --   --    Cardiac Enzymes: No results for input(s): CKTOTAL, CKMB, CKMBINDEX, TROPONINI in the last 72 hours. CBC:  Recent Labs Lab 08/04/14 2339 08/05/14 2041 08/06/14 0213 08/06/14 0800  WBC 6.9 7.5 6.7 7.1  NEUTROABS 4.2  --   --   --   HGB 13.1 12.8 11.9* 13.3  HCT 39.9 39.5 37.0 40.8  MCV 90.9 90.2 90.5 92.1  PLT 269 250 242 232   PROTIME: No results for input(s): LABPROT, INR in the last 72 hours. Liver Function Tests: No results for input(s): AST, ALT, ALKPHOS, BILITOT, PROT, ALBUMIN in the last 72 hours. No results for input(s): LIPASE, AMYLASE in the last 72 hours. BNP: BNP (last 3 results)  Recent Labs  12/08/13 1028 12/23/13 1105 08/04/14 2339  PROBNP 948.6* 248.1* 1727.0*   D-Dimer: No results for input(s): DDIMER in the last 72 hours. Hemoglobin A1C:   Device Interrogation pending  ASSESSMENT AND PLAN:  Active Problems:   Automatic implantable cardioverter-defibrillator in  situ   OSA on CPAP   Acute combined systolic and diastolic heart failure   Paroxysmal ventricular tachycardia knee and ankle pain  I am perplexed at the overall functional deteriortation in the absence of weight gain.  [facility]...  Her electrolytes are pretty good, and wonder if the flurry of VT isnt related to electromechanical interactions.    Will continue on amio and replete K  Last Mag was low and needs to be recehcked pending     Signed, Sherryl MangesSteven Hasana Alcorta MD  08/09/2014

## 2014-08-09 NOTE — Plan of Care (Signed)
Problem: Phase I Progression Outcomes Goal: Dyspnea controlled at rest (HF) Outcome: Completed/Met Date Met:  08/09/14     

## 2014-08-09 NOTE — Progress Notes (Signed)
Pt refusing cpap tonight, states she wants to just wear her oxygen. Expressed importance, and encouraged her to call if she changes her mind.

## 2014-08-09 NOTE — Progress Notes (Signed)
. Advanced Heart Failure Rounding Note   Subjective:    [name] is a 48 yo female with a history of NICM s/p BiV ICD [employer], [employer] buildings), chronic systolic HF, DM2, HTN, morbid obesity, VF/VT with frequent ICD shocks and OSA on CPAP.   Recently discharged 11/21 after multiple ICD shocks and CP.   Presented to the ED after multiple ICD shocks for VF and syncope.   PICC placed 11/26. Co-ox 64% CVP 21. Echo EF 30-35% (down from 35-40%) Elevated filling pressures. RV not well seen  Had CPX in June with submaximal effort. Test limited by restrictive lung physiology. Measured pVO2 9.3 (corrected to 17.1 for ibw).  Weight down 7 pounds on lasix and metolazone. Breathing better. Had bigeminy this am. No VT. K is 3.3. Co-ox 78%. CVP 14 with a lot of respiratory variation. Still c/o L ankle and knee pain from fall. xrays with soft tissue swelling no fx.    Objective:   Weight Range:  Vital Signs:   Temp:  [97.5 F (36.4 C)-98.6 F (37 C)] 98 F (36.7 C) (11/29 0401) Pulse Rate:  [59-71] 62 (11/29 0401) Resp:  [13-25] 23 (11/29 0401) BP: (94-118)/(53-83) 104/56 mmHg (11/29 0401) SpO2:  [92 %-100 %] 95 % (11/29 0401) Weight:  [117.9 kg (259 lb 14.8 oz)] 117.9 kg (259 lb 14.8 oz) (11/29 0401) Last BM Date: 08/05/14  Weight change: Filed Weights   08/07/14 0500 08/08/14 0415 08/09/14 0401  Weight: 120.1 kg (264 lb 12.4 oz) 120.8 kg (266 lb 5.1 oz) 117.9 kg (259 lb 14.8 oz)    Intake/Output:   Intake/Output Summary (Last 24 hours) at 08/09/14 0801 Last data filed at 08/09/14 0600  Gross per 24 hour  Intake 2566.6 ml  Output   4100 ml  Net -1533.4 ml     Physical Exam: General: Chronically ill appearing. Morbidly obese with limited mobility No resp difficulty, husband at bedside HEENT: normal Neck: supple. JVP hard to see. Looks elevated.Carotids 2+ bilat; no bruits. No lymphadenopathy or thryomegaly appreciated. Cor: PMI nonpalpable. Regular rate & rhythm. No rubs, gallops  or murmurs. Lungs: clear Abdomen: morbidly obese, soft, nontender, nondistended. No hepatosplenomegaly. No bruits or masses. Good bowel sounds. Extremities: no cyanosis, clubbing, rash, edema. warm Neuro: alert & orientedx3, cranial nerves grossly intact. moves all 4 extremities w/o difficulty. Affect pleasant  Telemetry: AV paced 60s occasional PVCs and bigeminy.   Labs: Basic Metabolic Panel:  Recent Labs Lab 08/06/14 0213 08/06/14 0359 08/06/14 0954 08/07/14 0530 08/08/14 0538 08/09/14 0500  NA 140  --  139 143 141 133*  K 3.4*  --  3.6* 3.6* 3.8 3.3*  CL 95*  --  94* 98 98 88*  CO2 32  --  30 33* 35* 35*  GLUCOSE 226*  --  248* 161* 148* 347*  BUN 11  --  10 11 11 14   CREATININE 0.95 0.84 0.90 0.90 0.89 0.86  CALCIUM 7.8*  --  8.3* 8.1* 7.9* 8.0*  MG  --   --  1.5  --   --   --     Liver Function Tests:  Recent Labs Lab 08/04/14 2339 08/06/14 0213  AST 18 18  ALT 12 11  ALKPHOS 87 80  BILITOT 0.4 0.5  PROT 8.4* 7.1  ALBUMIN 3.5 3.1*   No results for input(s): LIPASE, AMYLASE in the last 168 hours. No results for input(s): AMMONIA in the last 168 hours.  CBC:  Recent Labs Lab 08/04/14 2339 08/05/14 2041 08/06/14  1610 08/06/14 0800  WBC 6.9 7.5 6.7 7.1  NEUTROABS 4.2  --   --   --   HGB 13.1 12.8 11.9* 13.3  HCT 39.9 39.5 37.0 40.8  MCV 90.9 90.2 90.5 92.1  PLT 269 250 242 232    Cardiac Enzymes:  Recent Labs Lab 08/04/14 2339  TROPONINI <0.30    BNP: BNP (last 3 results)  Recent Labs  12/08/13 1028 12/23/13 1105 08/04/14 2339  PROBNP 948.6* 248.1* 1727.0*     Other results:    Imaging: Dg Ankle Complete Left  08/08/2014   CLINICAL DATA:  Left ankle pain following a recent fall.  EXAM: LEFT ANKLE COMPLETE - 3+ VIEW  COMPARISON:  None.  FINDINGS: Diffuse soft tissue swelling. No fracture, dislocation or effusion seen.  IMPRESSION: No fracture.   Electronically Signed   By: Gordan Payment M.D.   On: 08/08/2014 19:11   Dg Knee  Complete 4 Views Left  08/08/2014   CLINICAL DATA:  Left knee pain after fall from incline, initial encounter.  EXAM: LEFT KNEE - COMPLETE 4+ VIEW  COMPARISON:  None.  FINDINGS: There is no evidence of fracture, dislocation, or joint effusion. Mild osteophyte formation is noted medially and laterally. Osteophyte formation also seen involving the patella. Soft tissues are unremarkable.  IMPRESSION: Mild degenerative joint disease. No acute abnormality seen in the left knee.   Electronically Signed   By: Roque Lias M.D.   On: 08/08/2014 19:09     Medications:     Scheduled Medications: . amiodarone  400 mg Oral 3 times per day  . aspirin EC  81 mg Oral Daily  . carvedilol  6.25 mg Oral BID WC  . digoxin  0.125 mg Oral Daily  . docusate sodium  100 mg Oral Daily  . enoxaparin (LOVENOX) injection  60 mg Subcutaneous Q24H  . feeding supplement (GLUCERNA SHAKE)  237 mL Oral Q1500  . ferrous sulfate  325 mg Oral TID  . furosemide  120 mg Intravenous Q8H  . gabapentin  600 mg Oral QID  . insulin aspart  0-20 Units Subcutaneous TID WC  . insulin aspart  0-5 Units Subcutaneous QHS  . insulin detemir  100 Units Subcutaneous QHS  . ivabradine  2.5 mg Oral BID WC  . losartan  75 mg Oral Daily  . metolazone  2.5 mg Oral BID WC  . pantoprazole  40 mg Oral Daily  . potassium chloride SA  40 mEq Oral BID  . potassium chloride  80 mEq Oral Once  . sodium chloride  10-40 mL Intracatheter Q12H  . sodium chloride  3 mL Intravenous Q12H  . spironolactone  25 mg Oral Daily  . temazepam  30 mg Oral QHS  . vitamin E  400 Units Oral Daily    Infusions:    PRN Medications: sodium chloride, acetaminophen, albuterol, cyclobenzaprine, HYDROmorphone, LORazepam, ondansetron (ZOFRAN) IV, sodium chloride, sodium chloride   Assessment:   1) VFib  - s/p ICD dishcarge 2) Syncope 3) A/C systolic HF d/t NICM EF 30-35% 4) OSA - on CPAP 5) Morbid obesity 6) Hypokalemia/hyponatremia 7) L knee and ankle  pain s/p fall - xrays without fracture  Plan/Discussion:    Good diuresis. Tolerating increased dose of losartan. Will continue 24-48 more hours as BP tolerates. Supp K+. Co-ox looks very good.   VT per EP. Relatively quiescent on amio. Does have PVCs in setting of hypokalemia.   L knee and ankle sprain. Will give Ultram. Check uric  acid as well.   Consult CR to mobilize,    Length of Stay: 4  Arvilla Meresaniel Bensimhon MD 08/09/2014, 8:01 AM  Advanced Heart Failure Team Pager [phone] (M-F; 7a - 4p)  Please contact [facility] for night-coverage after hours (4p -7a ) and weekends on [email or web address]

## 2014-08-10 DIAGNOSIS — Z95810 Presence of automatic (implantable) cardiac defibrillator: Secondary | ICD-10-CM

## 2014-08-10 LAB — GLUCOSE, CAPILLARY
GLUCOSE-CAPILLARY: 225 mg/dL — AB (ref 70–99)
Glucose-Capillary: 181 mg/dL — ABNORMAL HIGH (ref 70–99)
Glucose-Capillary: 219 mg/dL — ABNORMAL HIGH (ref 70–99)

## 2014-08-10 LAB — CARBOXYHEMOGLOBIN
Carboxyhemoglobin: 1.5 % (ref 0.5–1.5)
METHEMOGLOBIN: 0.9 % (ref 0.0–1.5)
O2 SAT: 62.6 %
TOTAL HEMOGLOBIN: 14 g/dL (ref 12.0–16.0)

## 2014-08-10 LAB — BASIC METABOLIC PANEL
Anion gap: 11 (ref 5–15)
BUN: 27 mg/dL — ABNORMAL HIGH (ref 6–23)
CO2: 39 mEq/L — ABNORMAL HIGH (ref 19–32)
CREATININE: 1.23 mg/dL — AB (ref 0.50–1.10)
Calcium: 9.2 mg/dL (ref 8.4–10.5)
Chloride: 88 mEq/L — ABNORMAL LOW (ref 96–112)
GFR calc Af Amer: 59 mL/min — ABNORMAL LOW (ref >90)
GFR calc non Af Amer: 51 mL/min — ABNORMAL LOW (ref >90)
GLUCOSE: 155 mg/dL — AB (ref 70–99)
POTASSIUM: 3.9 meq/L (ref 3.7–5.3)
Sodium: 138 mEq/L (ref 137–147)

## 2014-08-10 LAB — MAGNESIUM: Magnesium: 1.8 mg/dL (ref 1.5–2.5)

## 2014-08-10 LAB — URIC ACID: URIC ACID, SERUM: 12.7 mg/dL — AB (ref 2.4–7.0)

## 2014-08-10 MED ORDER — DEXTROSE 5 % IV SOLN
120.0000 mg | Freq: Two times a day (BID) | INTRAVENOUS | Status: DC
  Filled 2014-08-10 (×2): qty 12

## 2014-08-10 MED ORDER — TORSEMIDE 20 MG PO TABS
40.0000 mg | ORAL_TABLET | Freq: Two times a day (BID) | ORAL | Status: DC
  Filled 2014-08-10 (×2): qty 2

## 2014-08-10 MED ORDER — MAGNESIUM SULFATE 2 GM/50ML IV SOLN
2.0000 g | Freq: Once | INTRAVENOUS | Status: AC
  Administered 2014-08-10: 2 g via INTRAVENOUS
  Filled 2014-08-10: qty 50

## 2014-08-10 MED ORDER — METOLAZONE 2.5 MG PO TABS
2.5000 mg | ORAL_TABLET | Freq: Every day | ORAL | Status: DC
  Filled 2014-08-10: qty 1

## 2014-08-10 MED ORDER — FUROSEMIDE 10 MG/ML IJ SOLN
120.0000 mg | Freq: Two times a day (BID) | INTRAVENOUS | Status: DC
  Filled 2014-08-10: qty 12

## 2014-08-10 NOTE — Progress Notes (Signed)
CARDIAC REHAB PHASE I   PRE:  Rate/Rhythm: 60 paced  BP:  Supine:   Sitting: 130/67  Standing:    SaO2: 99%RA  MODE:  Ambulation: 280 ft   POST:  Rate/Rhythm: 70 paced  BP:  Supine:   Sitting: 104/40  Standing:    SaO2: 94%RA 1335-1430 Pt motivated to walk. Walked 280 ft on RA with rolling walker. One asst to manage equipment and one to follow with recliner. Pt sat once to rest but tolerated well. No DOE noted.   Luetta Nutting, RN BSN  08/10/2014 2:25 PM

## 2014-08-10 NOTE — Progress Notes (Signed)
. Advanced Heart Failure Rounding Note   Subjective:    Ms. Odendahl is a 48 yo female with a history of NICM s/p BiV ICD [employer], [employer] buildings), chronic systolic HF, DM2, HTN, morbid obesity, VF/VT with frequent ICD shocks and OSA on CPAP. Had CPX in June with submaximal effort. Test limited by restrictive lung physiology. Measured pVO2 9.3 (corrected to 17.1 for ibw).  Recently discharged 11/21 after multiple ICD shocks and CP.   Presented to the ED after multiple ICD shocks for VF and syncope.   PICC placed 11/26. Co-ox 64% CVP 21. Echo EF 30-35% (down from 35-40%) Elevated filling pressures. RV not well seen   Weight down 9 pounds on lasix and metolazone. (19 pounds total) CVP down to 10. Renal function stable.   Denies SOB. Still not ambulating much. Refused CPAP last night.      Objective:   Weight Range:  Vital Signs:   Temp:  [97.5 F (36.4 C)-98 F (36.7 C)] 97.9 F (36.6 C) (11/30 0409) Pulse Rate:  [60-70] 60 (11/30 0409) Resp:  [13-24] 19 (11/30 0409) BP: (95-116)/(46-71) 103/65 mmHg (11/30 0409) SpO2:  [90 %-100 %] 100 % (11/30 0409) Weight:  [250 lb (113.4 kg)] 250 lb (113.4 kg) (11/30 0600) Last BM Date: 08/09/14  Weight change: Filed Weights   08/08/14 0415 08/09/14 0401 08/10/14 0600  Weight: 266 lb 5.1 oz (120.8 kg) 259 lb 14.8 oz (117.9 kg) 250 lb (113.4 kg)    Intake/Output:   Intake/Output Summary (Last 24 hours) at 08/10/14 0658 Last data filed at 08/09/14 2034  Gross per 24 hour  Intake   1022 ml  Output   4650 ml  Net  -3628 ml     Physical Exam: CVP 9-10  General: Chronically ill appearing. Morbidly obese with limited mobility No resp difficulty HEENT: normal Neck: supple. JVP hard to see. Looks elevated.Carotids 2+ bilat; no bruits. No lymphadenopathy or thryomegaly appreciated. Cor: PMI nonpalpable. Regular rate & rhythm. No rubs, gallops or murmurs. Lungs: clear Abdomen: morbidly obese, soft, nontender, nondistended. No  hepatosplenomegaly. No bruits or masses. Good bowel sounds. Extremities: no cyanosis, clubbing, rash, edema. Warm. RUE PICC Neuro: alert & orientedx3, cranial nerves grossly intact. moves all 4 extremities w/o difficulty. Affect pleasant  Telemetry: AV paced 60s occasional PVCs and bigeminy.   Labs: Basic Metabolic Panel:  Recent Labs Lab 08/06/14 0213 08/06/14 0359 08/06/14 0954 08/07/14 0530 08/08/14 0538 08/09/14 0500  NA 140  --  139 143 141 133*  K 3.4*  --  3.6* 3.6* 3.8 3.3*  CL 95*  --  94* 98 98 88*  CO2 32  --  30 33* 35* 35*  GLUCOSE 226*  --  248* 161* 148* 347*  BUN 11  --  10 11 11 14   CREATININE 0.95 0.84 0.90 0.90 0.89 0.86  CALCIUM 7.8*  --  8.3* 8.1* 7.9* 8.0*  MG  --   --  1.5  --   --   --     Liver Function Tests:  Recent Labs Lab 08/04/14 2339 08/06/14 0213  AST 18 18  ALT 12 11  ALKPHOS 87 80  BILITOT 0.4 0.5  PROT 8.4* 7.1  ALBUMIN 3.5 3.1*   No results for input(s): LIPASE, AMYLASE in the last 168 hours. No results for input(s): AMMONIA in the last 168 hours.  CBC:  Recent Labs Lab 08/04/14 2339 08/05/14 2041 08/06/14 0213 08/06/14 0800  WBC 6.9 7.5 6.7 7.1  NEUTROABS 4.2  --   --   --  HGB 13.1 12.8 11.9* 13.3  HCT 39.9 39.5 37.0 40.8  MCV 90.9 90.2 90.5 92.1  PLT 269 250 242 232    Cardiac Enzymes:  Recent Labs Lab 08/04/14 2339  TROPONINI <0.30    BNP: BNP (last 3 results)  Recent Labs  12/08/13 1028 12/23/13 1105 08/04/14 2339  PROBNP 948.6* 248.1* 1727.0*     Other results:    Imaging: Dg Ankle Complete Left  08/08/2014   CLINICAL DATA:  Left ankle pain following a recent fall.  EXAM: LEFT ANKLE COMPLETE - 3+ VIEW  COMPARISON:  None.  FINDINGS: Diffuse soft tissue swelling. No fracture, dislocation or effusion seen.  IMPRESSION: No fracture.   Electronically Signed   By: Gordan Payment M.D.   On: 08/08/2014 19:11   Dg Knee Complete 4 Views Left  08/08/2014   CLINICAL DATA:  Left knee pain after fall  from incline, initial encounter.  EXAM: LEFT KNEE - COMPLETE 4+ VIEW  COMPARISON:  None.  FINDINGS: There is no evidence of fracture, dislocation, or joint effusion. Mild osteophyte formation is noted medially and laterally. Osteophyte formation also seen involving the patella. Soft tissues are unremarkable.  IMPRESSION: Mild degenerative joint disease. No acute abnormality seen in the left knee.   Electronically Signed   By: Roque Lias M.D.   On: 08/08/2014 19:09     Medications:     Scheduled Medications: . amiodarone  400 mg Oral 3 times per day  . aspirin EC  81 mg Oral Daily  . carvedilol  6.25 mg Oral BID WC  . digoxin  0.125 mg Oral Daily  . docusate sodium  100 mg Oral Daily  . enoxaparin (LOVENOX) injection  60 mg Subcutaneous Q24H  . feeding supplement (GLUCERNA SHAKE)  237 mL Oral Q1500  . ferrous sulfate  325 mg Oral TID  . furosemide  120 mg Intravenous Q8H  . gabapentin  600 mg Oral QID  . insulin aspart  0-20 Units Subcutaneous TID WC  . insulin aspart  0-5 Units Subcutaneous QHS  . insulin detemir  100 Units Subcutaneous QHS  . ivabradine  2.5 mg Oral BID WC  . losartan  75 mg Oral Daily  . metolazone  2.5 mg Oral BID WC  . pantoprazole  40 mg Oral Daily  . potassium chloride SA  60 mEq Oral BID  . sodium chloride  10-40 mL Intracatheter Q12H  . sodium chloride  3 mL Intravenous Q12H  . spironolactone  25 mg Oral Daily  . temazepam  30 mg Oral QHS  . vitamin E  400 Units Oral Daily    Infusions:    PRN Medications: sodium chloride, acetaminophen, albuterol, cyclobenzaprine, HYDROmorphone, LORazepam, ondansetron (ZOFRAN) IV, sodium chloride, sodium chloride, traMADol   Assessment:   1) VFib  - s/p ICD dishcarge 2) Syncope 3) A/C systolic HF d/t NICM EF 30-35% 4) OSA - on CPAP 5) Morbid obesity 6) Hypokalemia/hyponatremia 7) L knee and ankle pain s/p fall - xrays without fracture  Plan/Discussion:    Brisk diuresis noted. Weight down another 9  pounds. Continue lasix 120 mg every 8 hours + metolazone 2.5 mg daily. Will adjust after BMET results. Continue losartan 75 mg daily. Labs pending.    VT per EP. Continue amiodarone.   L knee and ankle sprain. Continue Ultram as needed. Check uric acid pending. .   Consult CR to mobilize,    Length of Stay: 5  CLEGG,[name] NP-C  08/10/2014, 6:58 AM  Advanced Heart  Failure Team Pager [phone] (M-F; 7a - 4p)  Please contact [facility] for night-coverage after hours (4p -7a ) and weekends on [email or web address]  Patient seen and examined with Tonye BecketAmy Clegg, NP. We discussed all aspects of the encounter. I agree with the assessment and plan as stated above.   Volume status much improved. Renal function stable. Will Continue diuresis one more day as tolerated. Will cut lasix back to bid as to not overshoot.  VT quiescent. EP seeing.   Cardiac Rehab to mobilize. Encouraged her to be more active.   Ajanee Buren,MD 8:15 AM

## 2014-08-10 NOTE — Progress Notes (Signed)
Inpatient Diabetes Program Recommendations  AACE/ADA: New Consensus Statement on Inpatient Glycemic Control (2013)  Target Ranges:  Prepandial:   less than 140 mg/dL      Peak postprandial:   less than 180 mg/dL (1-2 hours)      Critically ill patients:  140 - 180 mg/dL    Inpatient Diabetes Program Recommendations HgbA1C: Please check current level. (last one done 12/14/2013 - was 9.9%) Diet: Please add carbohydrate modified to present diet orders Thank you, Lenor Coffin, RN, CNS, Diabetes Coordinator [phone])

## 2014-08-10 NOTE — Progress Notes (Signed)
Patient Name: [name]    SUBJECTIVE:  Much improved dyspnea  nonsichemic cardiomyiopathy, last EF 35 3/15 with RHC CI 1.4 and RA pressures 30 S/p CRT  Syncopal VT a few weeks ago, declined admission and amio started as outpt.  Thought also to be in HF Recurrent syncope last pm>>ICD--VT.  She feels distended but interestingly weight is relatively stable over recent weeks, but she has felt terrible and been in bed for most of three weeks.  No further sustained VT, PVC burden improved   Past Medical History  Diagnosis Date  . CHF (congestive heart failure)     a. EF 30-35%, RV mildly dilated (difficult study 11/2013) b. RHC (12/2013): RA 38/37 (35), RV 90/28, PA 95/51 (71), PCWP 54, PA 40%, AO 96 %, CO/CI Fick 3.2/1.4, PVR 5.3   . Nonischemic cardiomyopathy 12/2013    a. LHC (12/2013) normal coronary anatomy   . Mitral regurgitation     moderate to severe  . S/P cardiac catheterization   . LBBB (left bundle branch block)     s/p [employer] CRT-D  . HTN (hypertension)   . Asthma   . IBS (irritable bowel syndrome)     with primarily constipation  . Morbid obesity   . GERD (gastroesophageal reflux disease)   . IDA (iron deficiency anemia)     2o TO SB AVMS, Hx parenteral iron Dr Mariel SleetNeijstrom  . AVM (arteriovenous malformation)     Small bowel, s/p duble balloon enter[facility]  . Anemia, chronic disease   . Peripheral neuropathy   . OSA (obstructive sleep apnea)     on CPAP qhs  . GI bleed 06/2009    Hgb 7.5, ferritin 7, transfusion, iron IV  . Dyspnea on exertion     chronic  . Diabetes mellitus without complication   . ICD (implantable cardioverter-defibrillator), single, in situ 07/28/2014 interrogation    Pt shocked 3x since 07/20/14---pt intentionally unplugged Latitude, remote received 07/27/14 once plugged in. Pt unaware of shock on 07/27/14. Thresholds and sensing consistent with previous device measurements. Lead  impedance trends stable over time.     Scheduled Meds:  Scheduled Meds: . amiodarone  400 mg Oral 3 times per day  . aspirin EC  81 mg Oral Daily  . carvedilol  6.25 mg Oral BID WC  . digoxin  0.125 mg Oral Daily  . docusate sodium  100 mg Oral Daily  . enoxaparin (LOVENOX) injection  60 mg Subcutaneous Q24H  . feeding supplement (GLUCERNA SHAKE)  237 mL Oral Q1500  . ferrous sulfate  325 mg Oral TID  . gabapentin  600 mg Oral QID  . insulin aspart  0-20 Units Subcutaneous TID WC  . insulin aspart  0-5 Units Subcutaneous QHS  . insulin detemir  100 Units Subcutaneous QHS  . ivabradine  2.5 mg Oral BID WC  . losartan  75 mg Oral Daily  . magnesium sulfate 1 - 4 g bolus IVPB  2 g Intravenous Once  . [START ON 08/11/2014] metolazone  2.5 mg Oral Daily  . pantoprazole  40 mg Oral Daily  . potassium chloride SA  60 mEq Oral BID  . sodium chloride  10-40 mL Intracatheter Q12H  . sodium chloride  3 mL Intravenous Q12H  . spironolactone  25 mg Oral Daily  . temazepam  30 mg Oral QHS  . torsemide  40 mg Oral BID  . vitamin E  400 Units Oral Daily  Continuous Infusions:   sodium chloride, acetaminophen, albuterol, cyclobenzaprine, HYDROmorphone, LORazepam, ondansetron (ZOFRAN) IV, sodium chloride, sodium chloride, traMADol    PHYSICAL EXAM Filed Vitals:   08/10/14 0409 08/10/14 0500 08/10/14 0600 08/10/14 0801  BP: 103/65 100/64 100/67 99/61  Pulse: 60 59 59 64  Temp: 97.9 F (36.6 C)   97.6 F (36.4 C)  TempSrc: Oral   Oral  Resp: 19 20 18 21   Height:      Weight:   250 lb (113.4 kg)   SpO2: 100% 100% 100% 99%    General appearance: alert, no major  distress and less anxious Neck: supple, symmetrical, trachea midline and unable to discernneck veins Lungs: clear to auscultation bilaterally Heart: regular rate and rhythm, S1, S2 normal, no murmur, click, rub or gallop Abdomen: soft, non-tender; bowel sounds normal; no masses,  no organomegaly Extremities: extremities  normal, atraumatic, no cyanosis  tr edema Skin: Skin color, texture, turgor normal. No rashes or lesions Neurologic: Grossly normal  TELEMETRY: Reviewed telemetry pt in P-synchronous/ AV  pacing with few PVC:    Intake/Output Summary (Last 24 hours) at 08/10/14 0950 Last data filed at 08/10/14 0910  Gross per 24 hour  Intake   1466 ml  Output   6150 ml  Net  -4684 ml    LABS: Basic Metabolic Panel:  Recent Labs Lab 08/05/14 2041 08/06/14 0213 08/06/14 0359 08/06/14 0954 08/07/14 0530 08/08/14 0538 08/09/14 0500 08/10/14 0650  NA 138 140  --  139 143 141 133* 138  K 3.4* 3.4*  --  3.6* 3.6* 3.8 3.3* 3.9  CL 95* 95*  --  94* 98 98 88* 88*  CO2 27 32  --  30 33* 35* 35* 39*  GLUCOSE 260* 226*  --  248* 161* 148* 347* 155*  BUN 11 11  --  10 11 11 14  27*  CREATININE 0.90 0.95 0.84 0.90 0.90 0.89 0.86 1.23*  CALCIUM 8.1* 7.8*  --  8.3* 8.1* 7.9* 8.0* 9.2  MG  --   --   --  1.5  --   --   --  1.8   Cardiac Enzymes: No results for input(s): CKTOTAL, CKMB, CKMBINDEX, TROPONINI in the last 72 hours. CBC:  Recent Labs Lab 08/04/14 2339 08/05/14 2041 08/06/14 0213 08/06/14 0800  WBC 6.9 7.5 6.7 7.1  NEUTROABS 4.2  --   --   --   HGB 13.1 12.8 11.9* 13.3  HCT 39.9 39.5 37.0 40.8  MCV 90.9 90.2 90.5 92.1  PLT 269 250 242 232   BNP: BNP (last 3 results)  Recent Labs  12/08/13 1028 12/23/13 1105 08/04/14 2339  PROBNP 948.6* 248.1* 1727.0*     ASSESSMENT AND PLAN:  Active Problems:   Automatic implantable cardioverter-defibrillator in situ   OSA on CPAP   Acute combined systolic and diastolic heart failure   Paroxysmal ventricular tachycardia   Hypomagnesemia   Appreciate heart failure's assistance with diuresis Ventricular ectopy significantly decreased on amiodarone Continue amiodarone 400mg  tid x 7d then 400mg  bid X *2weeks , then 200mg  bid X month Ok to transfer to telemetry today.  Cl depetion will ncrease repletion  Potential discharge 48-72  hours Will need TOC visit in 7 days.

## 2014-08-10 NOTE — Evaluation (Signed)
Occupational Therapy Evaluation Patient Details Name: [name] MRN: [id number] DOB: 01-16-1966 Today's Date: 08/10/2014    History of Present Illness This 48 y.o. female admitted after several ICD discharges due to VF arrests. 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    Clinical Impression   Pt admitted with above. She demonstrates the below listed deficits and will benefit from continued OT to maximize safety and independence with BADLs.  Pt presents to OT with generalized weakness.  SHe has pain Lt. Shoulder that she said worsened after fall during syncopal episode as well as Lt. Ankle and knee pain.  She requires min guard to mod A with BADLs, but anticipate she will progress back to modified  independence fairly quickly.  Will follow.       Follow Up Recommendations  No OT follow up;Supervision - Intermittent    Equipment Recommendations  None recommended by OT    Recommendations for Other Services       Precautions / Restrictions Precautions Precautions: Fall      Mobility Bed Mobility                  Transfers Overall transfer level: Needs assistance Equipment used: Rolling walker (2 wheeled) Transfers: Sit to/from [employer] Sit to Stand: Min guard Stand pivot transfers: Min guard            Balance Overall balance assessment: Needs assistance Sitting-balance support: Feet supported Sitting balance-[name]: Good     Standing balance support: Bilateral upper extremity supported Standing balance-[name]: Fair                              ADL Overall ADL's : Needs assistance/impaired Eating/Feeding: Independent   Grooming: Wash/dry hands;Wash/dry face;Oral care;Brushing hair;Set up;Sitting   Upper Body Bathing: Set up;Sitting   Lower Body Bathing: Minimal assistance;Sit to/from stand   Upper Body Dressing : Minimal  assistance;Sitting   Lower Body Dressing: Moderate assistance;Sit to/from stand   Toilet Transfer: Min guard;Ambulation;Comfort height toilet;BSC   Toileting- Clothing Manipulation and Hygiene: Minimal assistance;Sit to/from stand       Functional mobility during ADLs: Min guard;Rolling walker General ADL Comments: Pt fatigues quickly with BADLs.  Difficulty accessing feet for LB ADLs     Vision                     Perception     Praxis      Pertinent Vitals/Pain Pain Assessment: Faces Faces Pain Scale: Hurts little more Pain Location: Lt shoulder with movement and Lt. LE with activity  Pain Descriptors / Indicators: Aching Pain Intervention(s): Monitored during session;Limited activity within patient's tolerance     Hand Dominance Right   Extremity/Trunk Assessment Upper Extremity Assessment Upper Extremity Assessment: LUE deficits/detail LUE Deficits / Details: Pt reports she has had "issues" with her Lt. shoulder, and has been through PT, had injection, and other work up without a definitive diagnosis.   She reports shoulder improved, and she has full AROM, however, she reports increased pain anterior shoulder since falls/syncope that occured PTA.   Pt with IV in Lt. shoulder area therefore, unable to accurately assess it this date   Lower Extremity Assessment Lower Extremity Assessment: Defer to PT evaluation   Cervical / Trunk Assessment Cervical / Trunk Assessment: Normal   Communication Communication Communication: No difficulties   Cognition Arousal/Alertness: Awake/alert  Behavior During Therapy: WFL for tasks assessed/performed Overall Cognitive Status: Within Functional Limits for tasks assessed                     General Comments       Exercises       Shoulder Instructions      Home Living Family/patient expects to be discharged to:: Private residence Living Arrangements: Spouse/significant other;Children Available Help at  Discharge: Family;Available PRN/intermittently Type of Home: Apartment Home Access: Ramped entrance;Stairs to enter Entrance Stairs-Number of Steps: 1         Bathroom Shower/Tub: Tub/shower unit Shower/tub characteristics: Engineer, building servicesCurtain Bathroom Toilet: Standard     Home Equipment: Environmental consultantWalker - 2 wheels;Cane - single point;Tub bench   Additional Comments: Pt has ramp to apt, then one step up into doorway      Prior Functioning/Environment Level of Independence: Needs assistance  Gait / Transfers Assistance Needed: Pt reports she ambulates without AD, unless her legs bother her, and during those times, she uses RW (~1 mos) ADL's / Homemaking Assistance Needed: Pt reports she is able to perform BADLs, Husband assists with IADLs. Pt does drive.  Uses scooter in grocery store   Comments: Lives with 14 daughter and fiance'. Marchia MeiersFiance' is self employed and able to assist as needed.     OT Diagnosis: Generalized weakness;Acute pain   OT Problem List: Decreased strength;Decreased activity tolerance;Impaired balance (sitting and/or standing);Decreased safety awareness;Decreased knowledge of use of DME or AE;Cardiopulmonary status limiting activity;Obesity;Pain   OT Treatment/Interventions: Self-care/ADL training;Therapeutic exercise;DME and/or AE instruction;Manual therapy;Therapeutic activities;Balance training;Patient/family education    OT Goals(Current goals can be found in the care plan section) Acute Rehab OT Goals Patient Stated Goal: To get stronger and go home  OT Goal Formulation: With patient Time For Goal Achievement: 07/26/15 Potential to Achieve Goals: Good ADL Goals Pt Will Perform Grooming: with supervision;standing Pt Will Perform Upper Body Bathing: with set-up;sitting Pt Will Perform Lower Body Bathing: with supervision;sit to/from stand Pt Will Perform Upper Body Dressing: with set-up;sitting Pt Will Perform Lower Body Dressing: with supervision;sit to/from stand Pt Will  Transfer to Toilet: with supervision;ambulating;regular height toilet;bedside commode;grab bars Pt Will Perform Toileting - Clothing Manipulation and hygiene: with supervision;sit to/from stand  OT Frequency: Min 2X/week   Barriers to D/C:            Co-evaluation              End of Session Equipment Utilized During Treatment: Engineer, waterolling walker Nurse Communication: Mobility status  Activity Tolerance: Patient limited by fatigue Patient left: in chair;with call bell/phone within reach   Time: 1416-1448 OT Time Calculation (min): 32 min Charges:  OT General Charges $OT Visit: 1 Procedure OT Evaluation $Initial OT Evaluation Tier I: 1 Procedure OT Treatments $Therapeutic Activity: 23-37 mins G-Codes:    [name] M 08/10/2014, 3:24 PM

## 2014-08-11 DIAGNOSIS — M1000 Idiopathic gout, unspecified site: Secondary | ICD-10-CM

## 2014-08-11 LAB — BASIC METABOLIC PANEL
ANION GAP: 13 (ref 5–15)
BUN: 36 mg/dL — ABNORMAL HIGH (ref 6–23)
CO2: 37 meq/L — AB (ref 19–32)
Calcium: 9.1 mg/dL (ref 8.4–10.5)
Chloride: 87 mEq/L — ABNORMAL LOW (ref 96–112)
Creatinine, Ser: 1.19 mg/dL — ABNORMAL HIGH (ref 0.50–1.10)
GFR calc non Af Amer: 53 mL/min — ABNORMAL LOW (ref >90)
GFR, EST AFRICAN AMERICAN: 62 mL/min — AB (ref >90)
Glucose, Bld: 131 mg/dL — ABNORMAL HIGH (ref 70–99)
POTASSIUM: 3.9 meq/L (ref 3.7–5.3)
Sodium: 137 mEq/L (ref 137–147)

## 2014-08-11 LAB — GLUCOSE, CAPILLARY
GLUCOSE-CAPILLARY: 157 mg/dL — AB (ref 70–99)
GLUCOSE-CAPILLARY: 176 mg/dL — AB (ref 70–99)
Glucose-Capillary: 169 mg/dL — ABNORMAL HIGH (ref 70–99)
Glucose-Capillary: 347 mg/dL — ABNORMAL HIGH (ref 70–99)
Glucose-Capillary: 394 mg/dL — ABNORMAL HIGH (ref 70–99)

## 2014-08-11 MED ORDER — COLCHICINE 0.6 MG PO TABS
0.6000 mg | ORAL_TABLET | Freq: Two times a day (BID) | ORAL | Status: DC
  Administered 2014-08-11 – 2014-08-14 (×6): 0.6 mg via ORAL
  Filled 2014-08-11 (×7): qty 1

## 2014-08-11 MED ORDER — COLCHICINE 0.6 MG PO TABS
1.2000 mg | ORAL_TABLET | Freq: Once | ORAL | Status: AC
  Administered 2014-08-11: 1.2 mg via ORAL
  Filled 2014-08-11: qty 2

## 2014-08-11 MED ORDER — ALLOPURINOL 300 MG PO TABS
300.0000 mg | ORAL_TABLET | Freq: Every day | ORAL | Status: DC
  Administered 2014-08-11 – 2014-08-14 (×4): 300 mg via ORAL
  Filled 2014-08-11 (×4): qty 1

## 2014-08-11 MED ORDER — LOSARTAN POTASSIUM 50 MG PO TABS
50.0000 mg | ORAL_TABLET | Freq: Every day | ORAL | Status: DC
  Administered 2014-08-11 – 2014-08-14 (×4): 50 mg via ORAL
  Filled 2014-08-11 (×4): qty 1

## 2014-08-11 MED ORDER — PREDNISONE 20 MG PO TABS
40.0000 mg | ORAL_TABLET | Freq: Every day | ORAL | Status: AC
  Administered 2014-08-11 – 2014-08-13 (×3): 40 mg via ORAL
  Filled 2014-08-11 (×3): qty 2

## 2014-08-11 MED ORDER — TORSEMIDE 20 MG PO TABS
40.0000 mg | ORAL_TABLET | Freq: Two times a day (BID) | ORAL | Status: DC
Start: 2014-08-12 — End: 2014-08-12
  Administered 2014-08-12: 40 mg via ORAL
  Filled 2014-08-11 (×4): qty 2

## 2014-08-11 NOTE — Evaluation (Signed)
Physical Therapy Evaluation Patient Details Name: [name] MRN: [id number] DOB: 1966/06/17 Today's Date: 08/11/2014   History of Present Illness  This 48 y.o. female admitted after several ICD discharges due to VF arrests. 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   Clinical Impression  Patient demonstrates deficits in functional mobility as indicated below. Will need continued skilled PT to address deficits and maximize function. Will see as indicated and progress as tolerated.  VSS throughout session, educated patient regarding mobility expectations and positioning for comfort.    Follow Up Recommendations No PT follow up;Supervision - Intermittent    Equipment Recommendations  None recommended by PT    Recommendations for Other Services       Precautions / Restrictions Precautions Precautions: Fall      Mobility  Bed Mobility               General bed mobility comments: received in chair  Transfers Overall transfer level: Needs assistance Equipment used: Rolling walker (2 wheeled) Transfers: Sit to/from Stand Sit to Stand: Min guard         General transfer comment: no physical assist needed, guarding for safety  Ambulation/Gait Ambulation/Gait assistance: Supervision Ambulation Distance (Feet): 160 Feet Assistive device: Rolling walker (2 wheeled) Gait Pattern/deviations: Decreased stride length;Antalgic;Drifts right/left Gait velocity: decreased  Gait velocity interpretation: Below normal speed for age/gender General Gait Details: some lateral sway and increased drift during ambulation, no physical assist needed   Stairs            Wheelchair Mobility    Modified Rankin (Stroke Patients Only)       Balance   Sitting-balance support: Feet supported Sitting balance-[name]: Good     Standing balance support: Bilateral upper extremity supported Standing  balance-[name]: Fair                               Pertinent Vitals/Pain Pain Assessment: 0-10 Pain Score: 5  Pain Location: left knee and ankle Pain Intervention(s): Monitored during session;Limited activity within patient's tolerance    Home Living Family/patient expects to be discharged to:: Private residence Living Arrangements: Spouse/significant other;Children Available Help at Discharge: Family;Available PRN/intermittently Type of Home: Apartment Home Access: Ramped entrance;Stairs to enter   Entrance Stairs-Number of Steps: 1   Home Equipment: Walker - 2 wheels;Cane - single point;Tub bench Additional Comments: Pt has ramp to apt, then one step up into doorway    Prior Function Level of Independence: Needs assistance   Gait / Transfers Assistance Needed: Pt reports she ambulates without AD, unless her legs bother her, and during those times, she uses RW (~1 mos)  ADL's / Homemaking Assistance Needed: Pt reports she is able to perform BADLs, Husband assists with IADLs. Pt does drive.  Uses scooter in grocery store  Comments: Lives with 14 daughter and fiance'. Marchia Meiers' is self employed and able to assist as needed.      Hand Dominance   Dominant Hand: Right    Extremity/Trunk Assessment               Lower Extremity Assessment: Generalized weakness;LLE deficits/detail   LLE Deficits / Details: gout pain reported in knee and ankle  Cervical / Trunk Assessment: Normal  Communication   Communication: No difficulties  Cognition Arousal/Alertness: Awake/alert Behavior During Therapy: WFL for tasks assessed/performed Overall Cognitive Status: Within Functional Limits for tasks assessed  General Comments General comments (skin integrity, edema, etc.): VSS, educated on positioning and frequency of activity vs duration. Patient very receptive and appreciative.    Exercises        Assessment/Plan    PT  Assessment Patient needs continued PT services  PT Diagnosis Difficulty walking;Acute pain;Generalized weakness   PT Problem List Decreased strength;Decreased activity tolerance;Decreased balance;Decreased range of motion;Decreased mobility;Pain;Cardiopulmonary status limiting activity  PT Treatment Interventions DME instruction;Gait training;Stair training;Functional mobility training;Therapeutic activities;Therapeutic exercise;Balance training;Patient/family education   PT Goals (Current goals can be found in the Care Plan section) Acute Rehab PT Goals Patient Stated Goal: To get stronger and go home  PT Goal Formulation: With patient Time For Goal Achievement: 08/25/14 Potential to Achieve Goals: Good    Frequency Min 3X/week   Barriers to discharge        Co-evaluation               End of Session Equipment Utilized During Treatment: Gait belt Activity Tolerance: Patient tolerated treatment well;Patient limited by pain (left ankle) Patient left: in chair;with call bell/phone within reach Nurse Communication: Mobility status         Time: [id number] PT Time Calculation (min) (ACUTE ONLY): 21 min   Charges:   PT Evaluation $Initial PT Evaluation Tier I: 1 Procedure PT Treatments $Gait Training: 8-22 mins   PT G CodesFabio [name]:          [name] 08/11/2014, 11:23 AM  [name] [name], PT DPT  [phone]

## 2014-08-11 NOTE — Care Management Note (Signed)
    Page 1 of 2   08/11/2014     2:27:41 PM CARE MANAGEMENT NOTE 08/11/2014  Patient:  [name], [name]   Account Number:  [id number]  Date Initiated:  08/10/2014  Documentation initiated by:  Sybel Standish  Subjective/Objective Assessment:   dx multiple ICD shocks for VF; has [facility], lives with dtr and fiance    PCP  John Giovanni     Anticipated DC Date:  08/17/2014   Anticipated DC Plan:  HOME W HOME HEALTH SERVICES     DC Planning Services  CM consult      [facility] Choice  HOME HEALTH   Choice offered to / List presented to:  C-1 Patient   DME arranged  Levan Hurst      DME agency  [facility]    [facility] arranged  HH-1 RN  HH-10 DISEASE MANAGEMENT      HH agency  [facility]   Status of service:  In process, will continue to follow Medicare Important Message given?  YES (If response is "NO", the following Medicare IM given date fields will be blank) Date Medicare IM given:  08/10/2014 Medicare IM given by:  Kross Swallows Date Additional Medicare IM given:   Additional Medicare IM given by:    Per UR Regulation:  Reviewed for med. necessity/level of care/duration of stay  If discussed at Long Length of Stay Meetings, dates discussed:   08/11/2014   Comments:  08/11/14 1419 Kathyann Spaugh RN MSN BSN CCM Pt ambulated with cardiac rehab and states she is back to baseline. Pt has standard walker that she received more than 5 years ago, requests rolling walker.  Also requests home health nurse to assist with heart failure management.  Pt states she previously received services from [facility] and requests referral to that agency.

## 2014-08-11 NOTE — Progress Notes (Signed)
. 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 Had CPX in June with submaximal effort. Test limited by restrictive lung physiology. Measured pVO2 9.3 (corrected to 17.1 for ibw).  Recently discharged 11/21 after multiple ICD shocks and CP.   Presented to the ED after multiple ICD shocks for VF and syncope.   PICC placed 11/26. Co-ox 64% CVP 21. Echo EF 30-35% (down from 35-40%) Elevated filling pressures. RV not well seen   Weight up 8 pounds but fluid status looks low.  Renal function stable. Denies SOB. Complaining of L knee pain. Having some dizziness. CVP 4-5  Uric Acid 12.7   Objective:   Weight Range:  Vital Signs:   Temp:  [97.2 F (36.2 C)-98.7 F (37.1 C)] 97.8 F (36.6 C) (12/01 0810) Pulse Rate:  [6-63] 6 (12/01 0810) Resp:  [11-22] 11 (12/01 0810) BP: (85-112)/(39-69) 112/65 mmHg (12/01 0810) SpO2:  [91 %-99 %] 99 % (12/01 0810) Weight:  [259 lb 7.7 oz (117.7 kg)] 259 lb 7.7 oz (117.7 kg) (12/01 0427) Last BM Date: 08/09/14  Weight change: Filed Weights   08/09/14 0401 08/10/14 0600 08/11/14 0427  Weight: 259 lb 14.8 oz (117.9 kg) 250 lb (113.4 kg) 259 lb 7.7 oz (117.7 kg)    Intake/Output:   Intake/Output Summary (Last 24 hours) at 08/11/14 0851 Last data filed at 08/11/14 [id number]  Gross per 24 hour  Intake   1467 ml  Output   2150 ml  Net   -683 ml     Physical Exam: CVP not working  General: Chronically ill appearing. Morbidly obese with limited mobility No resp difficulty. Sitting in chair.  HEENT: normal Neck: supple. JVP hard to see but appears flat. Carotids 2+ bilat; no bruits. No lymphadenopathy. + goiter.  Cor: PMI nonpalpable. Regular rate & rhythm. No rubs, gallops or murmurs. Lungs: clear Abdomen: morbidly obese, soft, nontender, nondistended. No  hepatosplenomegaly. No bruits or masses. Good bowel sounds. Extremities: no cyanosis, clubbing, rash, edema. Warm. RUE PICC   L knee slightly warm and tender Neuro: alert & orientedx3, cranial nerves grossly intact. moves all 4 extremities w/o difficulty. Affect pleasant  Telemetry: AV paced 60s occasional PVCs and bigeminy.   Labs: Basic Metabolic Panel:  Recent Labs Lab 08/06/14 0954 08/07/14 0530 08/08/14 0538 08/09/14 0500 08/10/14 0650 08/11/14 0628  NA 139 143 141 133* 138 137  K 3.6* 3.6* 3.8 3.3* 3.9 3.9  CL 94* 98 98 88* 88* 87*  CO2 30 33* 35* 35* 39* 37*  GLUCOSE 248* 161* 148* 347* 155* 131*  BUN 10 11 11 14  27* 36*  CREATININE 0.90 0.90 0.89 0.86 1.23* 1.19*  CALCIUM 8.3* 8.1* 7.9* 8.0* 9.2 9.1  MG 1.5  --   --   --  1.8  --     Liver Function Tests:  Recent Labs Lab 08/04/14 2339 08/06/14 0213  AST 18 18  ALT 12 11  ALKPHOS 87 80  BILITOT 0.4 0.5  PROT 8.4* 7.1  ALBUMIN 3.5 3.1*   No results for input(s): LIPASE, AMYLASE in the last 168 hours. No results for input(s): AMMONIA in the last 168 hours.  CBC:  Recent Labs Lab 08/04/14 2339 08/05/14 2041 08/06/14 0213 08/06/14 0800  WBC 6.9 7.5 6.7 7.1  NEUTROABS 4.2  --   --   --  HGB 13.1 12.8 11.9* 13.3  HCT 39.9 39.5 37.0 40.8  MCV 90.9 90.2 90.5 92.1  PLT 269 250 242 232    Cardiac Enzymes:  Recent Labs Lab 08/04/14 2339  TROPONINI <0.30    BNP: BNP (last 3 results)  Recent Labs  12/08/13 1028 12/23/13 1105 08/04/14 2339  PROBNP 948.6* 248.1* 1727.0*     Other results:    Imaging: No results found.   Medications:     Scheduled Medications: . amiodarone  400 mg Oral 3 times per day  . aspirin EC  81 mg Oral Daily  . carvedilol  6.25 mg Oral BID WC  . digoxin  0.125 mg Oral Daily  . docusate sodium  100 mg Oral Daily  . enoxaparin (LOVENOX) injection  60 mg Subcutaneous Q24H  . feeding supplement (GLUCERNA SHAKE)  237 mL Oral Q1500  . ferrous sulfate  325  mg Oral TID  . gabapentin  600 mg Oral QID  . insulin aspart  0-20 Units Subcutaneous TID WC  . insulin aspart  0-5 Units Subcutaneous QHS  . insulin detemir  100 Units Subcutaneous QHS  . ivabradine  2.5 mg Oral BID WC  . losartan  75 mg Oral Daily  . metolazone  2.5 mg Oral Daily  . pantoprazole  40 mg Oral Daily  . potassium chloride SA  60 mEq Oral BID  . sodium chloride  10-40 mL Intracatheter Q12H  . sodium chloride  3 mL Intravenous Q12H  . spironolactone  25 mg Oral Daily  . temazepam  30 mg Oral QHS  . vitamin E  400 Units Oral Daily    Infusions:    PRN Medications: sodium chloride, acetaminophen, albuterol, cyclobenzaprine, HYDROmorphone, LORazepam, ondansetron (ZOFRAN) IV, sodium chloride, sodium chloride, traMADol   Assessment:   1) VFib  - s/p ICD dishcarge 2) Syncope 3) A/C systolic HF d/t NICM EF 30-35% 4) OSA - on CPAP 5) Morbid obesity 6) Hypokalemia/hyponatremia 7) L knee and ankle pain s/p fall - xrays without fracture 8) Goiter   Plan/Discussion:    Weight up 8 pounds which makes me wonder if weight from yesterday was accurate. Volume status low and she has a little dizziness. BP soft. CVP 4. Hold diuretics for today. Can restart torsemide 40 mg twice a day tomorrow. Cut back losartan to 50 mg daily. Continue carvedilol 6.25 mg twice a day and dig 0.125 mg daily . Dig level 0.7 . Renal function ok.   VT per EP. Continue amiodarone.   L knee and ankle sprain. Continue Ultram as needed. Uric acid 12.7 Suspect acute gout. Add colchicine and allopurinol. Give prednisone 40 mg daily for 3 days. .   Consult CR to mobilize  Length of Stay: 6 CLEGG,[name] NP-C  08/11/2014, 8:51 AM  Advanced Heart Failure Team Pager [phone] (M-F; 7a - 4p)  Please contact [facility] for night-coverage after hours (4p -7a ) and weekends on [email or web address]  Patient seen and examined with Tonye Becket, NP. We discussed all aspects of the encounter. I agree with the assessment  and plan as stated above.   Weights hard to follow. However CVP 4-5 (checked personally) and she is lightheaded. Will hold diuretics today. Start demadex tomorrow. Will get co-ox in am. L knee pain and elevated uric acid suspicious for acute gout. Treat as above. VT quiescent.   Likely home in am.   Truman Hayward 10:24 AM

## 2014-08-11 NOTE — Progress Notes (Signed)
CARDIAC REHAB PHASE I   PRE:  Rate/Rhythm: paced 60  BP:  Supine:   Sitting: 93/38  Standing:    SaO2: 96%RA  MODE:  Ambulation: 150 ft   POST:  Rate/Rhythm: 73 paced  BP:  Supine:   Sitting: 96/49  Standing:    SaO2: 97%RA 1332-1410 Pt walked 150 ft with rolling walker and asst x 1. Did not go as far as yesterday but did not need to sit. C/o back and knee pain but said she was ok when asked about dizziness. To BSC and then recliner after walk. Call bell in reach.   Luetta Nutting, RN BSN  08/11/2014 2:05 PM

## 2014-08-11 NOTE — Progress Notes (Signed)
Spoke with patient at bedside regarding the restart of services with [facility] Care Management. Patient verbalized her interest in [facility] Care Management services. Patient signed consent form and packet with information given for disease management support or other services. Patient will receive post hospital follow up calls and be assessed for home visits. Of note, [facility] Care Management services does not replace or interfere with any services that are arranged by inpatient case management or social work. For additional questions or referrals please contact Charlesetta Shanks, RN, BSN CCM, [facility] Liaison at [phone].

## 2014-08-11 NOTE — Progress Notes (Signed)
  Inpatient Diabetes Program Recommendations  HgbA1C: Please check current level. (last one done 12/14/2013 - was 9.9%)  Diet: Please add carbohydrate modified to present diet orders  Thank you, Lenor Coffin, RN, CNS, Diabetes Coordinator [phone])

## 2014-08-12 DIAGNOSIS — E042 Nontoxic multinodular goiter: Secondary | ICD-10-CM

## 2014-08-12 DIAGNOSIS — M1000 Idiopathic gout, unspecified site: Secondary | ICD-10-CM | POA: Insufficient documentation

## 2014-08-12 LAB — BASIC METABOLIC PANEL
Anion gap: 12 (ref 5–15)
BUN: 31 mg/dL — AB (ref 6–23)
CO2: 31 mEq/L (ref 19–32)
CREATININE: 0.96 mg/dL (ref 0.50–1.10)
Calcium: 9.4 mg/dL (ref 8.4–10.5)
Chloride: 93 mEq/L — ABNORMAL LOW (ref 96–112)
GFR, EST AFRICAN AMERICAN: 80 mL/min — AB (ref >90)
GFR, EST NON AFRICAN AMERICAN: 69 mL/min — AB (ref >90)
Glucose, Bld: 203 mg/dL — ABNORMAL HIGH (ref 70–99)
Potassium: 5.2 mEq/L (ref 3.7–5.3)
Sodium: 136 mEq/L — ABNORMAL LOW (ref 137–147)

## 2014-08-12 LAB — GLUCOSE, CAPILLARY
GLUCOSE-CAPILLARY: 274 mg/dL — AB (ref 70–99)
Glucose-Capillary: 182 mg/dL — ABNORMAL HIGH (ref 70–99)
Glucose-Capillary: 376 mg/dL — ABNORMAL HIGH (ref 70–99)
Glucose-Capillary: 345 mg/dL — ABNORMAL HIGH (ref 70–99)

## 2014-08-12 MED ORDER — TORSEMIDE 20 MG PO TABS
60.0000 mg | ORAL_TABLET | Freq: Every day | ORAL | Status: DC
  Filled 2014-08-12: qty 3

## 2014-08-12 MED ORDER — TORSEMIDE 20 MG PO TABS
20.0000 mg | ORAL_TABLET | Freq: Once | ORAL | Status: AC
  Administered 2014-08-12: 20 mg via ORAL
  Filled 2014-08-12: qty 1

## 2014-08-12 MED ORDER — TORSEMIDE 20 MG PO TABS
60.0000 mg | ORAL_TABLET | Freq: Two times a day (BID) | ORAL | Status: DC
  Administered 2014-08-12 – 2014-08-13 (×2): 60 mg via ORAL
  Filled 2014-08-12 (×4): qty 3

## 2014-08-12 MED ORDER — METOLAZONE 5 MG PO TABS
5.0000 mg | ORAL_TABLET | Freq: Once | ORAL | Status: AC
  Administered 2014-08-12: 5 mg via ORAL
  Filled 2014-08-12: qty 1

## 2014-08-12 NOTE — Progress Notes (Addendum)
Inpatient Diabetes Program Recommendations  AACE/ADA: New Consensus Statement on Inpatient Glycemic Control (2013)  Target Ranges:  Prepandial:   less than 140 mg/dL      Peak postprandial:   less than 180 mg/dL (1-2 hours)      Critically ill patients:  140 - 180 mg/dL     Results for [name], [name] (MRN [id number]) as of 08/12/2014 08:23  Ref. Range 08/11/2014 08:08 08/11/2014 12:09 08/11/2014 16:34 08/11/2014 21:25  Glucose-Capillary Latest Range: 70-99 mg/dL 383 (H) 291 (H) 916 (H) 394 (H)     Patient having elevated postprandial glucose levels.  Eating solid PO diet.  Patient also receiving Prednisone 40 mg daily.   Current DM Meds: Levemir 100 units QHS         Novolog Resistant SSI   MD- Please consider adding Novolog Meal Coverage-   Novolog 4 units tid with meals (hold if pt NPO, hold if pt eats <50% of meal)   Will follow Ambrose Finland RN, MSN, CDE Diabetes Coordinator Inpatient Diabetes Program Team Pager: [phone] (8a-10p)

## 2014-08-12 NOTE — Progress Notes (Signed)
Physical Therapy Treatment Patient Details Name: [name] MRN: [id number] DOB: 10/07/1965 Today's Date: 08/12/2014    History of Present Illness This 48 y.o. female admitted after several ICD discharges due to VF arrests. 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     PT Comments    Patient tolerated ambulation well with and without device.  Educated patient on mobility and safety for discharge as well as appropriate use of RW for community ambulation and energy conservation.  Spoke with patient to review ROM and positioning for LE comfort.  Patient with good recall from previous session.   Follow Up Recommendations  Supervision - Intermittent     Equipment Recommendations  None recommended by PT    Recommendations for Other Services       Precautions / Restrictions Precautions Precautions: Fall    Mobility  Bed Mobility               General bed mobility comments: received in chair  Transfers Overall transfer level: Needs assistance Equipment used: Rolling walker (2 wheeled) Transfers: Sit to/from Stand Sit to Stand: Supervision         General transfer comment: no physical assist needed, guarding for safety  Ambulation/Gait Ambulation/Gait assistance: Supervision Ambulation Distance (Feet): 240 Feet (+ addl 70 ft without device) Assistive device: Rolling walker (2 wheeled) Gait Pattern/deviations: Decreased stride length;Antalgic;Drifts right/left Gait velocity: improved cadence this session but still overall decreased Gait velocity interpretation: Below normal speed for age/gender General Gait Details: improved stability with use of RW, able to tolerate ambulation without device   Stairs            Wheelchair Mobility    Modified Rankin (Stroke Patients Only)       Balance     Sitting balance-[name]: Good       Standing balance-[name]: Fair                       Cognition Arousal/Alertness: Awake/alert Behavior During Therapy: WFL for tasks assessed/performed Overall Cognitive Status: Within Functional Limits for tasks assessed                      Exercises      General Comments General comments (skin integrity, edema, etc.): VSS throughtout activity, HR to 110s with ambulation, SpO2 >92% on room air, patient does not report SOB during ambulation      Pertinent Vitals/Pain Pain Assessment: 0-10 Pain Score: 4  Pain Location: ankle pain Pain Descriptors / Indicators: Aching Pain Intervention(s): Monitored during session;Limited activity within patient's tolerance    Home Living                      Prior Function            PT Goals (current goals can now be found in the care plan section) Acute Rehab PT Goals Patient Stated Goal: To get stronger and go home  PT Goal Formulation: With patient Time For Goal Achievement: 08/25/14 Potential to Achieve Goals: Good Progress towards PT goals: Progressing toward goals    Frequency  Min 3X/week    PT Plan      Co-evaluation             End of Session Equipment Utilized During Treatment: Gait belt Activity Tolerance: Patient tolerated treatment well;Patient limited by pain (left ankle) Patient left: in chair;with call bell/phone  within reach     Time: 0857-0921 PT Time Calculation (min) (ACUTE ONLY): 24 min  Charges:  $Gait Training: 8-22 mins $Self Care/Home Management: 8-22                    G CodesFabio Asa 08-23-2014, 9:24 AM Charlotte Crumb, PT DPT  [phone]

## 2014-08-12 NOTE — Progress Notes (Signed)
Occupational Therapy Discharge Patient Details Name: [name] MRN: [id number] DOB: 08/17/1966 Today's Date: 08/12/2014 Time: 9688-6484 OT Time Calculation (min): 39 min  Patient discharged from OT services secondary to goals met and no further OT needs identified.  Please see latest therapy progress note for current level of functioning and progress toward goals.    Progress and discharge plan discussed with patient and/or caregiver: Patient/Caregiver agrees with plan  Hardeman, Miles, OTR/L [phone]  08/12/2014, 1:03 PM

## 2014-08-12 NOTE — Plan of Care (Signed)
Problem: Phase II Progression Outcomes Goal: Pain controlled Outcome: Progressing                  

## 2014-08-12 NOTE — Progress Notes (Signed)
1348 Came to see pt to walk. Stated she had walked twice already and stomach hurting a little now. Pt walked with  PT and OT so will let pt rest.  From their notes, pt progressing well. Luetta Nutting RN BSN 08/12/2014 1:49 PM

## 2014-08-12 NOTE — Progress Notes (Signed)
. Advanced Heart Failure Rounding Note   Subjective:    Ms. Basgall is a 48 yo female with a history of NICM s/p BiV ICD [employer], historic buildings), chronic systolic HF, DM2, HTN, morbid obesity, VF/VT with frequent ICD shocks and OSA on CPAP. Had CPX in June with submaximal effort. Test limited by restrictive lung physiology. Measured pVO2 9.3 (corrected to 17.1 for ibw).  Recently discharged 11/21 after multiple ICD shocks and CP.   Presented to the ED after multiple ICD shocks for VF and syncope.   PICC placed 11/26. Co-ox 64% CVP 21. Echo EF 30-35% (down from 35-40%) Elevated filling pressures. RV not well seen  Diuretics held yesterday for low CVP. CVP now back up to 15. Breathing ok. Pending thyroid u/s. Weight down.   Uric Acid 12.7   Objective:   Weight Range:  Vital Signs:   Temp:  [97.8 F (36.6 C)-98.4 F (36.9 C)] 98.2 F (36.8 C) (12/02 0300) Pulse Rate:  [6-64] 62 (12/02 0100) Resp:  [11-22] 19 (12/02 0100) BP: (98-127)/(44-87) 115/44 mmHg (12/02 0300) SpO2:  [92 %-99 %] 97 % (12/02 0100) Weight:  [116.4 kg (256 lb 9.9 oz)-117.164 kg (258 lb 4.8 oz)] 116.4 kg (256 lb 9.9 oz) (12/02 0633) Last BM Date: 08/09/14  Weight change: Filed Weights   08/11/14 0906 08/12/14 0500 08/12/14 0633  Weight: 117.164 kg (258 lb 4.8 oz) 117 kg (257 lb 15 oz) 116.4 kg (256 lb 9.9 oz)    Intake/Output:   Intake/Output Summary (Last 24 hours) at 08/12/14 0600 Last data filed at 08/11/14 2138  Gross per 24 hour  Intake    480 ml  Output   1125 ml  Net   -645 ml     Physical Exam: CVP 15 General:  Morbidly obese with limited mobility No resp difficulty. Sitting in chair.  HEENT: normal Neck: supple. JVP hard to see. Carotids 2+ bilat; no bruits. No lymphadenopathy. + goiter.  Cor: PMI nonpalpable. Regular rate & rhythm. No rubs, gallops or murmurs. Lungs: clear Abdomen: morbidly obese, soft, nontender, nondistended. No hepatosplenomegaly. No bruits or masses. Good bowel  sounds. Extremities: no cyanosis, clubbing, rash, edema. Warm. RUE PICC   L knee slightly warm and tender Neuro: alert & orientedx3, cranial nerves grossly intact. moves all 4 extremities w/o difficulty. Affect pleasant  Telemetry: AV paced 60s occasional PVCs and bigeminy.   Labs: Basic Metabolic Panel:  Recent Labs Lab 08/06/14 0954 08/07/14 0530 08/08/14 0538 08/09/14 0500 08/10/14 0650 08/11/14 0628  NA 139 143 141 133* 138 137  K 3.6* 3.6* 3.8 3.3* 3.9 3.9  CL 94* 98 98 88* 88* 87*  CO2 30 33* 35* 35* 39* 37*  GLUCOSE 248* 161* 148* 347* 155* 131*  BUN 10 11 11 14  27* 36*  CREATININE 0.90 0.90 0.89 0.86 1.23* 1.19*  CALCIUM 8.3* 8.1* 7.9* 8.0* 9.2 9.1  MG 1.5  --   --   --  1.8  --     Liver Function Tests:  Recent Labs Lab 08/06/14 0213  AST 18  ALT 11  ALKPHOS 80  BILITOT 0.5  PROT 7.1  ALBUMIN 3.1*   No results for input(s): LIPASE, AMYLASE in the last 168 hours. No results for input(s): AMMONIA in the last 168 hours.  CBC:  Recent Labs Lab 08/05/14 2041 08/06/14 0213 08/06/14 0800  WBC 7.5 6.7 7.1  HGB 12.8 11.9* 13.3  HCT 39.5 37.0 40.8  MCV 90.2 90.5 92.1  PLT 250 242 232  Cardiac Enzymes: No results for input(s): CKTOTAL, CKMB, CKMBINDEX, TROPONINI in the last 168 hours.  BNP: BNP (last 3 results)  Recent Labs  12/08/13 1028 12/23/13 1105 08/04/14 2339  PROBNP 948.6* 248.1* 1727.0*     Other results:    Imaging: No results found.   Medications:     Scheduled Medications: . allopurinol  300 mg Oral Daily  . amiodarone  400 mg Oral 3 times per day  . aspirin EC  81 mg Oral Daily  . carvedilol  6.25 mg Oral BID WC  . colchicine  0.6 mg Oral BID  . digoxin  0.125 mg Oral Daily  . docusate sodium  100 mg Oral Daily  . enoxaparin (LOVENOX) injection  60 mg Subcutaneous Q24H  . feeding supplement (GLUCERNA SHAKE)  237 mL Oral Q1500  . ferrous sulfate  325 mg Oral TID  . gabapentin  600 mg Oral QID  . insulin aspart   0-20 Units Subcutaneous TID WC  . insulin aspart  0-5 Units Subcutaneous QHS  . insulin detemir  100 Units Subcutaneous QHS  . ivabradine  2.5 mg Oral BID WC  . losartan  50 mg Oral Daily  . pantoprazole  40 mg Oral Daily  . potassium chloride SA  60 mEq Oral BID  . predniSONE  40 mg Oral Q breakfast  . sodium chloride  10-40 mL Intracatheter Q12H  . sodium chloride  3 mL Intravenous Q12H  . spironolactone  25 mg Oral Daily  . temazepam  30 mg Oral QHS  . torsemide  40 mg Oral BID  . vitamin E  400 Units Oral Daily    Infusions:    PRN Medications: sodium chloride, acetaminophen, albuterol, cyclobenzaprine, HYDROmorphone, LORazepam, ondansetron (ZOFRAN) IV, sodium chloride, sodium chloride, traMADol   Assessment:   1) VFib  - s/p ICD dishcarge 2) Syncope 3) A/C systolic HF d/t NICM EF 30-35% 4) OSA - on CPAP 5) Morbid obesity 6) Hypokalemia/hyponatremia 7) L knee and ankle pain s/p fall - xrays without fracture 8) Goiter   Plan/Discussion:    CVP trending back up. Will start demadex 60 bid (was on 60/40) at home. Give one dose of metolazone. Goal will be too see if we can keep CVP 10 or under on home regimen. Counseled on fluid restriction.  VT quiescent.  Thyroid us today. Suspect multinodular goiter.   Continue gout treatment. Steroids likely contributing to fluid retention.   Likely home in am.   Truman HaywardDaniel Deya Bigos,MD 6:38 AM Advanced Heart Failure Team Pager [phone] (M-F; 7a - 4p)  Please contact [facility] for night-coverage after hours (4p -7a ) and weekends on [email or web address]

## 2014-08-12 NOTE — Progress Notes (Signed)
Occupational Therapy Treatment Patient Details Name: [name] MRN: [id number] DOB: 08-17-1966 Today's Date: 08/12/2014    History of present illness This 48 y.o. female admitted after several ICD discharges due to VF arrests. 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    OT comments  Pt has met all OT goals.  She is able to perform all BADLs with assist for lines only, and reports she feels stronger than she did PTA.  She demonstrates good safety awareness.  No further OT needs identified, will sign off - pt agreeable.   Follow Up Recommendations  No OT follow up;Supervision - Intermittent    Equipment Recommendations  None recommended by OT    Recommendations for Other Services      Precautions / Restrictions Precautions Precautions: Fall       Mobility Bed Mobility                  Transfers Overall transfer level: Needs assistance Equipment used: None Transfers: Sit to/from Stand;Stand Pivot Transfers Sit to Stand: Independent Stand pivot transfers: Supervision       General transfer comment: supervision need only for lines     Balance             Standing balance-[name]: Good                     ADL       Grooming: Wash/dry hands;Wash/dry face;Oral care;Brushing hair;Set up;Standing   Upper Body Bathing: Set up;Sitting   Lower Body Bathing: Set up;Sit to/from stand   Upper Body Dressing : Set up;Standing;Sitting   Lower Body Dressing: Set up;Sit to/from stand   Toilet Transfer: Set up;Ambulation;Comfort height toilet   Toileting- Clothing Manipulation and Hygiene: Independent;Sit to/from stand       Functional mobility during ADLs: Modified independent General ADL Comments: Pt requires set up due to lines, otherwise she is independent.  She reports she has been performing her am ADLs without assist for the last 2 days  (she is able to stand  independently x 4 mins before fatigue)      Vision                     Perception     Praxis      Cognition   Behavior During Therapy: [facility] for tasks assessed/performed Overall Cognitive Status: Within Functional Limits for tasks assessed                       Extremity/Trunk Assessment               Exercises     Shoulder Instructions       General Comments      Pertinent Vitals/ Pain       Pain Assessment: No/denies pain  Home Living                                          Prior Functioning/Environment              Frequency       Progress Toward Goals  OT Goals(current goals can now be found in the care plan section)     ADL Goals Pt Will Perform Grooming: with supervision;standing Pt Will Perform Upper Body Bathing: with set-up;sitting Pt  Will Perform Lower Body Bathing: with supervision;sit to/from stand Pt Will Perform Upper Body Dressing: with set-up;sitting Pt Will Perform Lower Body Dressing: with supervision;sit to/from stand Pt Will Transfer to Toilet: with supervision;ambulating;regular height toilet;bedside commode;grab bars Pt Will Perform Toileting - Clothing Manipulation and hygiene: with supervision;sit to/from stand  Plan All goals met and education completed, patient discharged from OT services    Co-evaluation                 End of Session     Activity Tolerance Patient tolerated treatment well   Patient Left in chair;with call bell/phone within reach   Nurse Communication Mobility status        Time: 3943-2003 OT Time Calculation (min): 39 min  Charges: OT General Charges $OT Visit: 1 Procedure OT Treatments $Self Care/Home Management : 23-37 mins $Therapeutic Activity: 8-22 mins  [name] 08/12/2014, 1:01 PM

## 2014-08-13 ENCOUNTER — Inpatient Hospital Stay (HOSPITAL_COMMUNITY): Payer: Medicare Other

## 2014-08-13 DIAGNOSIS — E049 Nontoxic goiter, unspecified: Secondary | ICD-10-CM

## 2014-08-13 DIAGNOSIS — K625 Hemorrhage of anus and rectum: Secondary | ICD-10-CM

## 2014-08-13 LAB — BASIC METABOLIC PANEL
ANION GAP: 16 — AB (ref 5–15)
BUN: 48 mg/dL — ABNORMAL HIGH (ref 6–23)
CHLORIDE: 83 meq/L — AB (ref 96–112)
CO2: 33 mEq/L — ABNORMAL HIGH (ref 19–32)
CREATININE: 1.34 mg/dL — AB (ref 0.50–1.10)
Calcium: 9.1 mg/dL (ref 8.4–10.5)
GFR calc non Af Amer: 46 mL/min — ABNORMAL LOW (ref >90)
GFR, EST AFRICAN AMERICAN: 53 mL/min — AB (ref >90)
Glucose, Bld: 271 mg/dL — ABNORMAL HIGH (ref 70–99)
Potassium: 3.9 mEq/L (ref 3.7–5.3)
Sodium: 132 mEq/L — ABNORMAL LOW (ref 137–147)

## 2014-08-13 LAB — CBC
HCT: 42.6 % (ref 36.0–46.0)
Hemoglobin: 13.9 g/dL (ref 12.0–15.0)
MCH: 29.3 pg (ref 26.0–34.0)
MCHC: 32.6 g/dL (ref 30.0–36.0)
MCV: 89.9 fL (ref 78.0–100.0)
PLATELETS: 339 10*3/uL (ref 150–400)
RBC: 4.74 MIL/uL (ref 3.87–5.11)
RDW: 13.4 % (ref 11.5–15.5)
WBC: 9.7 10*3/uL (ref 4.0–10.5)

## 2014-08-13 LAB — CARBOXYHEMOGLOBIN
CARBOXYHEMOGLOBIN: 1.2 % (ref 0.5–1.5)
Methemoglobin: 0.9 % (ref 0.0–1.5)
O2 Saturation: 72.4 %
Total hemoglobin: 14.7 g/dL (ref 12.0–16.0)

## 2014-08-13 LAB — GLUCOSE, CAPILLARY
GLUCOSE-CAPILLARY: 150 mg/dL — AB (ref 70–99)
GLUCOSE-CAPILLARY: 160 mg/dL — AB (ref 70–99)
GLUCOSE-CAPILLARY: 273 mg/dL — AB (ref 70–99)
Glucose-Capillary: 184 mg/dL — ABNORMAL HIGH (ref 70–99)

## 2014-08-13 LAB — TSH: TSH: 2.36 u[IU]/mL (ref 0.350–4.500)

## 2014-08-13 LAB — T3, FREE: T3, Free: 2 pg/mL — ABNORMAL LOW (ref 2.3–4.2)

## 2014-08-13 LAB — CLOSTRIDIUM DIFFICILE BY PCR: Toxigenic C. Difficile by PCR: NEGATIVE

## 2014-08-13 LAB — T4, FREE: Free T4: 1.44 ng/dL (ref 0.80–1.80)

## 2014-08-13 LAB — OCCULT BLOOD X 1 CARD TO LAB, STOOL: Fecal Occult Bld: POSITIVE — AB

## 2014-08-13 LAB — MAGNESIUM: Magnesium: 1.9 mg/dL (ref 1.5–2.5)

## 2014-08-13 MED ORDER — MAGNESIUM SULFATE 2 GM/50ML IV SOLN
2.0000 g | Freq: Once | INTRAVENOUS | Status: AC
  Administered 2014-08-13: 2 g via INTRAVENOUS
  Filled 2014-08-13: qty 50

## 2014-08-13 MED ORDER — PANTOPRAZOLE SODIUM 40 MG PO TBEC
40.0000 mg | DELAYED_RELEASE_TABLET | Freq: Two times a day (BID) | ORAL | Status: DC
  Administered 2014-08-13 – 2014-08-14 (×3): 40 mg via ORAL
  Filled 2014-08-13 (×3): qty 1

## 2014-08-13 MED ORDER — POTASSIUM CHLORIDE CRYS ER 20 MEQ PO TBCR
40.0000 meq | EXTENDED_RELEASE_TABLET | Freq: Once | ORAL | Status: AC
  Administered 2014-08-13: 40 meq via ORAL

## 2014-08-13 MED ORDER — INSULIN ASPART 100 UNIT/ML ~~LOC~~ SOLN
6.0000 [IU] | Freq: Three times a day (TID) | SUBCUTANEOUS | Status: DC
  Administered 2014-08-13 – 2014-08-14 (×3): 6 [IU] via SUBCUTANEOUS

## 2014-08-13 NOTE — Plan of Care (Signed)
Problem: Phase II Progression Outcomes Goal: Walk in hall or up in chair TID Outcome: Progressing     

## 2014-08-13 NOTE — Progress Notes (Signed)
NUTRITION FOLLOW UP  DOCUMENTATION CODES Per approved criteria  -Morbid Obesity   INTERVENTION: Continue Glucerna Shake po daily, each supplement provides 220 kcal and 10 grams of protein RD to follow for nutrition care plan  NUTRITION DIAGNOSIS: Inadequate oral intake, improved  Goal: Pt to meet >/= 90% of their estimated nutrition needs, progressing  Monitor:  PO & supplemental intake, weight, labs, I/O's  ASSESSMENT: 48 y.o. Female with PMH of CHF, HTN, asthma, morbid obesity; presented to ED after her defibrillator fired 3 times while at home; + several episodes of possible syncope.  Pt currently in ULTRASOUND.    Noted pt having small amount of blood in stool with abdominal pain.  PO intake improved at 50-100% per flowsheet records.  Receiving Glucerna Shake supplement daily.  Height: Ht Readings from Last 1 Encounters:  08/06/14 5\' 5"  (1.651 m)    Weight: Wt Readings from Last 1 Encounters:  08/13/14 250 lb 10.6 oz (113.7 kg)    BMI:  Body mass index is 41.71 kg/(m^2).  Estimated Nutritional Needs: Kcal: 1700-1900 Protein: 80-90 gm Fluid: 1.7-1.9 L  Skin: Intact  Diet Order: Diet 2 gram sodium   Intake/Output Summary (Last 24 hours) at 08/13/14 1123 Last data filed at 08/13/14 0856  Gross per 24 hour  Intake    720 ml  Output   2950 ml  Net  -2230 ml    Labs:   Recent Labs Lab 08/10/14 0650 08/11/14 0628 08/12/14 0624 08/13/14 0412 08/13/14 0731  NA 138 137 136* 132*  --   K 3.9 3.9 5.2 3.9  --   CL 88* 87* 93* 83*  --   CO2 39* 37* 31 33*  --   BUN 27* 36* 31* 48*  --   CREATININE 1.23* 1.19* 0.96 1.34*  --   CALCIUM 9.2 9.1 9.4 9.1  --   MG 1.8  --   --   --  1.9  GLUCOSE 155* 131* 203* 271*  --     CBG (last 3)   Recent Labs  08/12/14 1623 08/12/14 2139 08/13/14 0738  GLUCAP 376* 345* 150*    Scheduled Meds: . allopurinol  300 mg Oral Daily  . amiodarone  400 mg Oral 3 times per day  . aspirin EC  81 mg Oral Daily  .  carvedilol  6.25 mg Oral BID WC  . colchicine  0.6 mg Oral BID  . digoxin  0.125 mg Oral Daily  . docusate sodium  100 mg Oral Daily  . enoxaparin (LOVENOX) injection  60 mg Subcutaneous Q24H  . feeding supplement (GLUCERNA SHAKE)  237 mL Oral Q1500  . ferrous sulfate  325 mg Oral TID  . gabapentin  600 mg Oral QID  . insulin aspart  0-20 Units Subcutaneous TID WC  . insulin aspart  0-5 Units Subcutaneous QHS  . insulin aspart  6 Units Subcutaneous TID WC  . insulin detemir  100 Units Subcutaneous QHS  . ivabradine  2.5 mg Oral BID WC  . losartan  50 mg Oral Daily  . magnesium sulfate 1 - 4 g bolus IVPB  2 g Intravenous Once  . pantoprazole  40 mg Oral BID AC  . sodium chloride  10-40 mL Intracatheter Q12H  . sodium chloride  3 mL Intravenous Q12H  . spironolactone  25 mg Oral Daily  . temazepam  30 mg Oral QHS  . torsemide  60 mg Oral BID  . vitamin E  400 Units Oral Daily  Continuous Infusions:    Past Medical History  Diagnosis Date  . CHF (congestive heart failure)     a. EF 30-35%, RV mildly dilated (difficult study 11/2013) b. RHC (12/2013): RA 38/37 (35), RV 90/28, PA 95/51 (71), PCWP 54, PA 40%, AO 96 %, CO/CI Fick 3.2/1.4, PVR 5.3   . Nonischemic cardiomyopathy 12/2013    a. LHC (12/2013) normal coronary anatomy   . Mitral regurgitation     moderate to severe  . S/P cardiac catheterization   . LBBB (left bundle branch block)     s/p [employer] CRT-D  . HTN (hypertension)   . Asthma   . IBS (irritable bowel syndrome)     with primarily constipation  . Morbid obesity   . GERD (gastroesophageal reflux disease)   . IDA (iron deficiency anemia)     2o TO SB AVMS, Hx parenteral iron Dr Mariel Sleet  . AVM (arteriovenous malformation)     Small bowel, s/p duble balloon enteroscopy/APC jejunum Dr Gwinda Passe [facility]) 01/23/2011  . Anemia, chronic disease   . Peripheral neuropathy   . OSA (obstructive sleep apnea)     on CPAP qhs  . GI bleed 06/2009    Hgb 7.5,  ferritin 7, transfusion, iron IV  . Dyspnea on exertion     chronic  . Diabetes mellitus without complication   . ICD (implantable cardioverter-defibrillator), single, in situ 07/28/2014 interrogation    Pt shocked 3x since 07/20/14---pt intentionally unplugged Latitude, remote received 07/27/14 once plugged in. Pt unaware of shock on 07/27/14. Thresholds and sensing consistent with previous device measurements. Lead impedance trends stable over time.     Past Surgical History  Procedure Laterality Date  . Crdt-implantation  4/07    [employer]. remote- yes  . Appendectomy    . Cholecystectomy      biliary dyskinesia  . Mastectomy  2003    left, partial  . Knee arthroscopy  2005    left  . Small bowel enteroscopy  MAY 2012 [facility] DR. GILLIAM    SB AVMS s/p APC  . Colonoscopy  OCT 2010/SEP 2011    tortuous colon, 3 polyps-benign(2010),   . Upper gastrointestinal endoscopy  '08, '10, SEP 2011    mild antral gastritis (2010), negative SB bx (2010), incomlpete Schatzki's ring (2011)  . Small bowel enteroscopy  SEP 2011 [facility]    Fields-NO AVMS  . Givens capsule study  NOV 2010     NO AVMS, normal  . Irrigation and debridement abscess  06/25/2012    Procedure: MINOR INCISION AND DRAINAGE OF ABSCESS;  Surgeon: [name], MD;  Location: [facility];  Service: General;  Laterality: [name];  Incision & Drainage of Infected Sebaceous Cyst on Chest  . Breast surgery    . Cardiac defibrillator placement      [facility] [employer]    [place], [place], [place] Pager #: [phone] After-Hours Pager #: [phone]

## 2014-08-13 NOTE — Progress Notes (Signed)
RT called to patients room. RT filled humidifier with water. Pt resting comfortably.

## 2014-08-13 NOTE — Progress Notes (Addendum)
. Advanced Heart Failure Rounding Note   Subjective:    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 Had CPX in June with submaximal effort. Test limited by restrictive lung physiology. Measured pVO2 9.3 (corrected to 17.1 for ibw).  Recently discharged 11/21 after multiple ICD shocks and CP.   Presented to the ED after multiple ICD shocks for VF and syncope.   PICC placed 11/26. Co-ox 64% CVP 21. Echo EF 30-35% (down from 35-40%) Elevated filling pressures. RV not well seen  Yesterday she started back on demadex 60 mg twice a day and got one dose metolazone.  Weight down 6 pounds. Cr going up.  Having watery diarrhea with small amount of blood noted in stool.      Thyroid ultrasound with thyromegaly and several small cysts but no nodules.   Objective:   Weight Range:  Vital Signs:   Temp:  [98 F (36.7 C)-98.6 F (37 C)] 98 F (36.7 C) (12/03 0440) Pulse Rate:  [59-67] 67 (12/03 0440) Resp:  [13-19] 19 (12/03 0440) BP: (96-108)/(50-74) 98/62 mmHg (12/03 0440) SpO2:  [93 %-100 %] 98 % (12/03 0440) Weight:  [250 lb 10.6 oz (113.7 kg)] 250 lb 10.6 oz (113.7 kg) (12/03 0440) Last BM Date: 08/12/14  Weight change: Filed Weights   08/12/14 0500 08/12/14 0633 08/13/14 0440  Weight: 257 lb 15 oz (117 kg) 256 lb 9.9 oz (116.4 kg) 250 lb 10.6 oz (113.7 kg)    Intake/Output:   Intake/Output Summary (Last 24 hours) at 08/13/14 16100721 Last data filed at 08/13/14 0500  Gross per 24 hour  Intake    720 ml  Output   2950 ml  Net  -2230 ml     Physical Exam: CVP 9 General:  Morbidly obese with limited mobility No resp difficulty. Sitting in chair.  HEENT: normal Neck: supple. JVP hard to see. Carotids 2+ bilat; no bruits. No lymphadenopathy. + goiter.  Cor: PMI nonpalpable. Regular rate & rhythm. No rubs, gallops or murmurs. Lungs:  clear Abdomen: morbidly obese, soft, tenderness in upper quadrants.  No hepatosplenomegaly. No bruits or masses. Good bowel sounds. Extremities: no cyanosis, clubbing, rash, edema. Warm. RUE PICC   L knee slightly warm and tender Neuro: alert & orientedx3, cranial nerves grossly intact. moves all 4 extremities w/o difficulty. Affect pleasant  Telemetry: AV paced 60s and bigeminy.   Labs: Basic Metabolic Panel:  Recent Labs Lab 08/06/14 0954  08/09/14 0500 08/10/14 0650 08/11/14 0628 08/12/14 0624 08/13/14 0412  NA 139  < > 133* 138 137 136* 132*  K 3.6*  < > 3.3* 3.9 3.9 5.2 3.9  CL 94*  < > 88* 88* 87* 93* 83*  CO2 30  < > 35* 39* 37* 31 33*  GLUCOSE 248*  < > 347* 155* 131* 203* 271*  BUN 10  < > 14 27* 36* 31* 48*  CREATININE 0.90  < > 0.86 1.23* 1.19* 0.96 1.34*  CALCIUM 8.3*  < > 8.0* 9.2 9.1 9.4 9.1  MG 1.5  --   --  1.8  --   --   --   < > = values in this interval not displayed.  Liver Function Tests: No results for input(s): AST, ALT, ALKPHOS, BILITOT, PROT, ALBUMIN in the last 168 hours. No results for input(s): LIPASE, AMYLASE in the last 168 hours. No  results for input(s): AMMONIA in the last 168 hours.  CBC:  Recent Labs Lab 08/06/14 0800  WBC 7.1  HGB 13.3  HCT 40.8  MCV 92.1  PLT 232    Cardiac Enzymes: No results for input(s): CKTOTAL, CKMB, CKMBINDEX, TROPONINI in the last 168 hours.  BNP: BNP (last 3 results)  Recent Labs  12/08/13 1028 12/23/13 1105 08/04/14 2339  PROBNP 948.6* 248.1* 1727.0*     Other results:    Imaging: No results found.   Medications:     Scheduled Medications: . allopurinol  300 mg Oral Daily  . amiodarone  400 mg Oral 3 times per day  . aspirin EC  81 mg Oral Daily  . carvedilol  6.25 mg Oral BID WC  . colchicine  0.6 mg Oral BID  . digoxin  0.125 mg Oral Daily  . docusate sodium  100 mg Oral Daily  . enoxaparin (LOVENOX) injection  60 mg Subcutaneous Q24H  . feeding supplement (GLUCERNA SHAKE)   237 mL Oral Q1500  . ferrous sulfate  325 mg Oral TID  . gabapentin  600 mg Oral QID  . insulin aspart  0-20 Units Subcutaneous TID WC  . insulin aspart  0-5 Units Subcutaneous QHS  . insulin detemir  100 Units Subcutaneous QHS  . ivabradine  2.5 mg Oral BID WC  . losartan  50 mg Oral Daily  . pantoprazole  40 mg Oral Daily  . predniSONE  40 mg Oral Q breakfast  . sodium chloride  10-40 mL Intracatheter Q12H  . sodium chloride  3 mL Intravenous Q12H  . spironolactone  25 mg Oral Daily  . temazepam  30 mg Oral QHS  . torsemide  60 mg Oral BID  . vitamin E  400 Units Oral Daily    Infusions:    PRN Medications: sodium chloride, acetaminophen, albuterol, cyclobenzaprine, HYDROmorphone, LORazepam, ondansetron (ZOFRAN) IV, sodium chloride, sodium chloride, traMADol   Assessment:   1) VFib  - s/p ICD dishcarge 2) Syncope 3) A/C systolic HF d/t NICM EF 30-35% 4) OSA - on CPAP 5) Morbid obesity 6) Hypokalemia/hyponatremia 7) L knee and ankle pain s/p fall - xrays without fracture 8) Goiter  9) BRBPR  Plan/Discussion:    Dry today. CVP back down. Cr up a bit. Continue demadex 60 bid for now. Follow renal function.   [facility] AVMs 2013 and required 3UPRBCs. Yesterday she had small amount of blood noted in her stool. Check stat CBC. Guaiac stool. Increase protonix to bid. May need to GI consult if hemoglobin down.   VT quiescent. Check magnesium   Thyroid US with thyromegaly - check TFTs  Continue gout treatment. Steroids likely contributing to fluid retention.    [name],[name], NP-C  7:21 AM Advanced Heart Failure Team Pager [phone] (M-F; 7a - 4p)  Please contact [facility] for night-coverage after hours (4p -7a ) and weekends on [email or web address]   Patient seen and examined with Tonye Becket, NP. We discussed all aspects of the encounter. I agree with the assessment and plan as stated above.   Volume status improved. No with diarrhea. Hold afternoon demadex. No metolazone.  Check c. Diff.    VT quiescent on amio.  + BRBPR. Small amounts. CBC stable. F/u with GI as outpatient.   Will check TFs for thyromegaly w/u.   Continue steroids for gout.   Barbara Sakamoto,MD 11:25 AM

## 2014-08-13 NOTE — Progress Notes (Signed)
CARDIAC REHAB PHASE I   PRE:  Rate/Rhythm: 78 paced   BP:  Supine: 97/55  Sitting: 115/61  Standing:    SaO2: 100%RA  MODE:  Ambulation: 64  ft   POST:  Rate/Rhythm: 78 paced with bigeminy PVCS  BP:  Supine:   Sitting: 123/98  Standing:    SaO2: 95%RA 1127-1208 Pt voided prior to walking and c/o dizziness upon standing from using BSC. Noticed pt had two leads off but looked as if having lots of PVCs. Had pt sit in recliner and took BP and changed leads. BP 115/61 and rhythm looked like it did when I first walked in room. Just mainly paced rhythm. Pt then said had to use BSC again for diarrhea. Helped pt again and she wanted to try walk. Able to walk short distance of 64 ft with gait belt use and asst x 1. Continued to c/o dizziness and again I noticed lots of PVCs on monitor. Looked as if in bigeminy for a while. Pt back to recliner and rhythm settled down. Call bell in reach. Pt weaker today.   Luetta Nutting, RN BSN  08/13/2014 12:02 PM

## 2014-08-14 LAB — BASIC METABOLIC PANEL
ANION GAP: 17 — AB (ref 5–15)
BUN: 66 mg/dL — ABNORMAL HIGH (ref 6–23)
CO2: 31 mEq/L (ref 19–32)
Calcium: 8.5 mg/dL (ref 8.4–10.5)
Chloride: 86 mEq/L — ABNORMAL LOW (ref 96–112)
Creatinine, Ser: 1.73 mg/dL — ABNORMAL HIGH (ref 0.50–1.10)
GFR calc Af Amer: 39 mL/min — ABNORMAL LOW (ref >90)
GFR, EST NON AFRICAN AMERICAN: 34 mL/min — AB (ref >90)
GLUCOSE: 132 mg/dL — AB (ref 70–99)
Potassium: 3.8 mEq/L (ref 3.7–5.3)
SODIUM: 134 meq/L — AB (ref 137–147)

## 2014-08-14 LAB — GLUCOSE, CAPILLARY: GLUCOSE-CAPILLARY: 145 mg/dL — AB (ref 70–99)

## 2014-08-14 MED ORDER — AMIODARONE HCL 200 MG PO TABS
ORAL_TABLET | ORAL | Status: DC
Start: 2014-08-14 — End: 2014-11-23

## 2014-08-14 MED ORDER — ALLOPURINOL 300 MG PO TABS: 300.0000 mg | ORAL_TABLET | Freq: Every day | ORAL | Status: AC

## 2014-08-14 MED ORDER — TORSEMIDE 20 MG PO TABS: 60.0000 mg | ORAL_TABLET | Freq: Two times a day (BID) | ORAL | Status: DC

## 2014-08-14 MED ORDER — POTASSIUM CHLORIDE CRYS ER 20 MEQ PO TBCR: 40.0000 meq | EXTENDED_RELEASE_TABLET | Freq: Once | ORAL | Status: DC

## 2014-08-14 NOTE — Progress Notes (Signed)
Patient wearing CPAP when RT walked in room. Pt tolerating well.

## 2014-08-14 NOTE — Discharge Summary (Signed)
Advanced Heart Failure Team  Discharge Summary   Patient ID: [name] MRN: [id number], DOB/AGE: Jun 12, 1966 48 y.o. Admit date: 08/05/2014 D/C date:     08/14/2014   Primary Discharge Diagnoses:  1) VFib  - s/p ICD dishcarge 2) Syncope 3) A/C systolic HF d/t NICM EF 30-35% - Diuresed 20 pounds. D/C weight 249 pounds.  4) OSA - on CPAP 5) Morbid obesity 6) Hypokalemia/hyponatremia 7) L knee and ankle pain s/p fall - xrays without fracture 8) Goiter- Thryoid [place] 08/13/14   9) BRBPR- CBC stable.    Hospital Course:  Ms. Gareau is a 48 yo female with a history of NICM s/p BiV ICD [employer] officer, historic buildings), chronic systolic HF, DM2, HTN, morbid obesity, VF/VT with frequent ICD shocks and OSA on CPAP. Had CPX in June with submaximal effort. Test limited by restrictive lung physiology. Measured pVO2 9.3 (corrected to 17.1 for ibw).  She presented to [facility] ED after multiple ICD shocks  For VF and syncope. Initial electrolytes were stable. EP followed along with recommendations for Amiodarone drip and later transitioned amiodarone 400 mg three times a day with taper schedule over the next 6 weeks. Will set up follow with EP after HF appointment next week.   She was also volume overloaded and required PICC to guide diuresis. She was diuresed with IV lasix and later transitioned to torsemide 60 mg twice a day. Overall she diuresed 20 pounds. She continued on coreg, ivabradine, digoxin, and losartan.   Due to complaints of L knee and ankle pain and Xray was performed and this was negative. Uric acid level was elevated and she was started on allopurinol and colchicine.   Due to thyromegaly an ultrasound was completed that showed small cysts but now nodules. Thyroid function test were normal. Plan to set up follow up with endocrine next week at HF appointment. She also had an episode of a small amount of bleeding not in bowel movement. CBC was stable and she will follow up with GI as an outpatient.    She will continue to be followed closely in the HF clinic with an appointment next week. Plan to check BMET and TSH at that time.   Discharge Weight Range: Discharge weight 249 pounds  Discharge Vitals: Blood pressure 101/60, pulse 60, temperature 97.3 F (36.3 C), temperature source Axillary, resp. rate 16, height 5\' 5"  (1.651 m), weight 249 lb 1.9 oz (113 kg), last menstrual period 06/21/2012, SpO2 95 %.  Labs: Lab Results  Component Value Date   WBC 9.7 08/13/2014   HGB 13.9 08/13/2014   HCT 42.6 08/13/2014   MCV 89.9 08/13/2014   PLT 339 08/13/2014    Recent Labs Lab 08/14/14 0500  NA 134*  K 3.8  CL 86*  CO2 31  BUN 66*  CREATININE 1.73*  CALCIUM 8.5  GLUCOSE 132*   Lab Results  Component Value Date   CHOL 195 06/21/2012   HDL 45 06/21/2012   LDLCALC 103* 06/21/2012   TRIG 233* 06/21/2012   BNP (last 3 results)  Recent Labs  12/08/13 1028 12/23/13 1105 08/04/14 2339  PROBNP 948.6* 248.1* 1727.0*    Diagnostic Studies/Procedures   US Soft Tissue Head/neck  08/13/2014   CLINICAL DATA:  Multiple nodules suspected on physical exam. Diabetes, hypertension.  EXAM: THYROID ULTRASOUND  TECHNIQUE: Ultrasound examination of the thyroid gland and adjacent soft tissues was performed.  COMPARISON:  None.  FINDINGS: Right thyroid lobe  Measurements: 64 x 29 x 22 mm. Mildly inhomogeneous echotexture.  There is a 3 mm cyst in the mid mid lobe, and a 3 mm cyst in the inferior pole.  Left thyroid lobe  Measurements: 61 x 24 x 22 mm. There is a 4 x 3 x 5 mm complex cyst, inferior pole, and a smaller 4 x 3 mm cyst medially.  Isthmus  Thickness: 8 mm.  No nodules visualized.  Lymphadenopathy  None visualized.  IMPRESSION: 1. Thyromegaly with several small cysts, no nodule or dominant mass. Findings do not meet current consensus criteria for biopsy. Follow-up by clinical exam is recommended. If patient has known risk factors for thyroid carcinoma, consider follow-up ultrasound in 12  months. If patient is clinically hyperthyroid, consider nuclear medicine thyroid uptake and scan. This recommendation follows the consensus statement: Management of Thyroid Nodules Detected as [place]: Society of Radiologists in Ultrasound Consensus Conference Statement. Radiology 2005; [id number].   Electronically Signed   By: Oley Balm M.D.   On: 08/13/2014 11:02    Discharge Medications     Medication List    STOP taking these medications        STOOL SOFTENER 100 MG capsule  Generic drug:  docusate sodium      TAKE these medications        albuterol 108 (90 BASE) MCG/ACT inhaler  Commonly known as:  PROVENTIL HFA;VENTOLIN HFA  Inhale 2 puffs into the lungs every 6 (six) hours as needed for wheezing or shortness of breath.     albuterol (5 MG/ML) 0.5% nebulizer solution  Commonly known as:  PROVENTIL  Take 0.5 mLs (2.5 mg total) by nebulization every 4 (four) hours as needed for wheezing or shortness of breath.     allopurinol 300 MG tablet  Commonly known as:  ZYLOPRIM  Take 1 tablet (300 mg total) by mouth daily.     amiodarone 200 MG tablet  Commonly known as:  PACERONE  Take 400 mg three times a day 12/4 through 12/5 then 400 mg twice a day 12/6 through 12/20 then 200 mg twice a day for one month     aspirin 81 MG EC tablet  Take 1 tablet (81 mg total) by mouth daily.     carvedilol 6.25 MG tablet  Commonly known as:  COREG  Take 1 tablet (6.25 mg total) by mouth 2 (two) times daily with a meal.     cetirizine 10 MG tablet  Commonly known as:  ZYRTEC  Take 10 mg by mouth daily as needed for allergies.     cyclobenzaprine 10 MG tablet  Commonly known as:  FLEXERIL  Take 10 mg by mouth 3 (three) times daily as needed for muscle spasms.     digoxin 0.125 MG tablet  Commonly known as:  LANOXIN  Take 1 tablet (0.125 mg total) by mouth daily.     ferrous sulfate 325 (65 FE) MG tablet  Take 325 mg by mouth 3 (three) times daily.     gabapentin 300 MG capsule   Commonly known as:  NEURONTIN  Take 600 mg by mouth 4 (four) times daily.     HYDROcodone-acetaminophen 10-325 MG per tablet  Commonly known as:  NORCO  Take 1 tablet by mouth every 4 (four) hours as needed for moderate pain.     insulin aspart 100 UNIT/ML injection  Commonly known as:  novoLOG  Inject 0-20 Units into the skin 3 (three) times daily with meals.     insulin detemir 100 UNIT/ML injection  Commonly known as:  LEVEMIR  Inject 100-110 Units into the skin at bedtime.     ivabradine 5 MG Tabs tablet  Commonly known as:  CORLANOR  Take 0.5 tablets (2.5 mg total) by mouth 2 (two) times daily with a meal.     losartan 50 MG tablet  Commonly known as:  COZAAR  Take 1 tablet (50 mg total) by mouth daily.     omeprazole 40 MG capsule  Commonly known as:  PRILOSEC  Take 1 capsule (40 mg total) by mouth daily.     potassium chloride SA 20 MEQ tablet  Commonly known as:  K-DUR,KLOR-CON  Take 1 tablet (20 mEq total) by mouth 2 (two) times daily. Or as instructed when to take extra.     spironolactone 25 MG tablet  Commonly known as:  ALDACTONE  Take 1 tablet (25 mg total) by mouth daily.     temazepam 30 MG capsule  Commonly known as:  RESTORIL  Take 30 mg by mouth at bedtime.     torsemide 20 MG tablet  Commonly known as:  DEMADEX  Take 3 tablets (60 mg total) by mouth 2 (two) times daily.     vitamin E 400 UNIT capsule  Take 400 Units by mouth daily.        Disposition   The patient will be discharged in stable condition to home.     Discharge Instructions    ACE Inhibitor / ARB already ordered    Complete by:  As directed      Diet - low sodium heart healthy    Complete by:  As directed      Heart Failure patients record your daily weight using the same scale at the same time of day    Complete by:  As directed      Increase activity slowly    Complete by:  As directed           Follow-up Information    Follow up with Arvilla Meresaniel Bensimhon, MD On  08/21/2014.   Specialty:  Cardiology   Why:  at 10:45 Garage Code [id number]    Contact information:   [address] [place] [address] [phone]         Duration of Discharge Encounter: Greater than 35 minutes   Signed, CLEGG,[name] NP-C  08/14/2014, 12:45 PM  Patient seen and examined with Tonye BecketAmy Clegg, NP. We discussed all aspects of the encounter. I agree with the assessment and plan as stated above.   She is much improved. VT is quiescent. Volume status looks good. She can be discharged today with close f/u in HF, EP and Endocrine Clinic (for goiter).  Daniel Bensimhon,MD 2:14 PM

## 2014-08-14 NOTE — Progress Notes (Signed)
Medicare Important Message given to patient @ 0900.

## 2014-08-14 NOTE — Discharge Instructions (Signed)

## 2014-08-14 NOTE — Progress Notes (Signed)
. Advanced Heart Failure Rounding Note   Subjective:    [name] is a 48 yo female with a history of NICM s/p BiV ICD [employer], historic buildings), chronic systolic HF, DM2, HTN, morbid obesity, VF/VT with frequent ICD shocks and OSA on CPAP. Had CPX in June with submaximal effort. Test limited by restrictive lung physiology. Measured pVO2 9.3 (corrected to 17.1 for ibw).  Recently discharged 11/21 after multiple ICD shocks and CP.   Presented to the ED after multiple ICD shocks for VF and syncope.   PICC placed 11/26. Co-ox 64% CVP 21. Echo EF 30-35% (down from 35-40%) Elevated filling pressures. RV not well seen  Diuretics held yesterday due to diarrhea. C. Diff negative. Weight down another pound. BMET pending. No further diarrhea. Feels good. VT quiet.    Thyroid ultrasound with thyromegaly and several small cysts but no nodules. TFTs normal    Objective:   Weight Range:  Vital Signs:   Temp:  [97.4 F (36.3 C)-99.2 F (37.3 C)] 97.4 F (36.3 C) (12/04 0404) Pulse Rate:  [59-77] 60 (12/04 0404) Resp:  [14-19] 16 (12/04 0404) BP: (95-105)/(43-72) 101/60 mmHg (12/04 0404) SpO2:  [94 %-99 %] 95 % (12/04 0404) Weight:  [113 kg (249 lb 1.9 oz)] 113 kg (249 lb 1.9 oz) (12/04 0404) Last BM Date: 08/13/14  Weight change: Filed Weights   08/12/14 0633 08/13/14 0440 08/14/14 0404  Weight: 116.4 kg (256 lb 9.9 oz) 113.7 kg (250 lb 10.6 oz) 113 kg (249 lb 1.9 oz)    Intake/Output:   Intake/Output Summary (Last 24 hours) at 08/14/14 1610 Last data filed at 08/13/14 2200  Gross per 24 hour  Intake   1200 ml  Output    901 ml  Net    299 ml     Physical Exam: General:  Morbidly obese with limited mobility No resp difficulty. Sitting in chair.  HEENT: normal Neck: supple. JVP hard to see. Carotids 2+ bilat; no bruits. No lymphadenopathy. + goiter.  Cor: PMI nonpalpable. Regular rate & rhythm. No rubs, gallops or murmurs. Lungs: clear Abdomen: morbidly obese, soft, tenderness in  upper quadrants.  No hepatosplenomegaly. No bruits or masses. Good bowel sounds. Extremities: no cyanosis, clubbing, rash, edema. Warm. RUE PICC    Neuro: alert & orientedx3, cranial nerves grossly intact. moves all 4 extremities w/o difficulty. Affect pleasant  Telemetry: AV paced 60s and bigeminy.   Labs: Basic Metabolic Panel:  Recent Labs Lab 08/09/14 0500 08/10/14 0650 08/11/14 0628 08/12/14 0624 08/13/14 0412 08/13/14 0731  NA 133* 138 137 136* 132*  --   K 3.3* 3.9 3.9 5.2 3.9  --   CL 88* 88* 87* 93* 83*  --   CO2 35* 39* 37* 31 33*  --   GLUCOSE 347* 155* 131* 203* 271*  --   BUN 14 27* 36* 31* 48*  --   CREATININE 0.86 1.23* 1.19* 0.96 1.34*  --   CALCIUM 8.0* 9.2 9.1 9.4 9.1  --   MG  --  1.8  --   --   --  1.9    Liver Function Tests: No results for input(s): AST, ALT, ALKPHOS, BILITOT, PROT, ALBUMIN in the last 168 hours. No results for input(s): LIPASE, AMYLASE in the last 168 hours. No results for input(s): AMMONIA in the last 168 hours.  CBC:  Recent Labs Lab 08/13/14 0731  WBC 9.7  HGB 13.9  HCT 42.6  MCV 89.9  PLT 339    Cardiac Enzymes: No  results for input(s): CKTOTAL, CKMB, CKMBINDEX, TROPONINI in the last 168 hours.  BNP: BNP (last 3 results)  Recent Labs  12/08/13 1028 12/23/13 1105 08/04/14 2339  PROBNP 948.6* 248.1* 1727.0*     Other results:    Imaging: US Soft Tissue Head/neck  08/13/2014   CLINICAL DATA:  Multiple nodules suspected on physical exam. Diabetes, hypertension.  EXAM: THYROID ULTRASOUND  TECHNIQUE: Ultrasound examination of the thyroid gland and adjacent soft tissues was performed.  COMPARISON:  None.  FINDINGS: Right thyroid lobe  Measurements: 64 x 29 x 22 mm. Mildly inhomogeneous echotexture. There is a 3 mm cyst in the mid mid lobe, and a 3 mm cyst in the inferior pole.  Left thyroid lobe  Measurements: 61 x 24 x 22 mm. There is a 4 x 3 x 5 mm complex cyst, inferior pole, and a smaller 4 x 3 mm cyst  medially.  Isthmus  Thickness: 8 mm.  No nodules visualized.  Lymphadenopathy  None visualized.  IMPRESSION: 1. Thyromegaly with several small cysts, no nodule or dominant mass. Findings do not meet current consensus criteria for biopsy. Follow-up by clinical exam is recommended. If patient has known risk factors for thyroid carcinoma, consider follow-up ultrasound in 12 months. If patient is clinically hyperthyroid, consider nuclear medicine thyroid uptake and scan. This recommendation follows the consensus statement: Management of Thyroid Nodules Detected as [place]: Society of Radiologists in Ultrasound Consensus Conference Statement. Radiology 2005; [id number].   Electronically Signed   By: Oley Balm M.D.   On: 08/13/2014 11:02     Medications:     Scheduled Medications: . allopurinol  300 mg Oral Daily  . amiodarone  400 mg Oral 3 times per day  . aspirin EC  81 mg Oral Daily  . carvedilol  6.25 mg Oral BID WC  . colchicine  0.6 mg Oral BID  . digoxin  0.125 mg Oral Daily  . enoxaparin (LOVENOX) injection  60 mg Subcutaneous Q24H  . feeding supplement (GLUCERNA SHAKE)  237 mL Oral Q1500  . ferrous sulfate  325 mg Oral TID  . gabapentin  600 mg Oral QID  . insulin aspart  0-20 Units Subcutaneous TID WC  . insulin aspart  0-5 Units Subcutaneous QHS  . insulin aspart  6 Units Subcutaneous TID WC  . insulin detemir  100 Units Subcutaneous QHS  . ivabradine  2.5 mg Oral BID WC  . losartan  50 mg Oral Daily  . pantoprazole  40 mg Oral BID AC  . sodium chloride  10-40 mL Intracatheter Q12H  . sodium chloride  3 mL Intravenous Q12H  . spironolactone  25 mg Oral Daily  . temazepam  30 mg Oral QHS  . vitamin E  400 Units Oral Daily    Infusions:    PRN Medications: sodium chloride, acetaminophen, albuterol, cyclobenzaprine, HYDROmorphone, LORazepam, ondansetron (ZOFRAN) IV, sodium chloride, sodium chloride, traMADol   Assessment:   1) VFib  - s/p ICD dishcarge 2) Syncope 3)  A/C systolic HF d/t NICM EF 30-35% 4) OSA - on CPAP 5) Morbid obesity 6) Hypokalemia/hyponatremia 7) L knee and ankle pain s/p fall - xrays without fracture 8) Goiter  9) BRBPR  Plan/Discussion:    VT quiescent on amio. Looks euvolemic. Weight down a total of 20 pounds.  VT quiescent on amio. Dosing per Dr. [name].   Can go home today. Will need f/u with HF Clinic, EP and Endocrine (w/u thyromegaly with normal TFTs)  Cardiac meds:  Demadex 60  bid Carvedilol 6.25 bid Amiodarone per Dr. Graciela HusbandsKlein Digoxin 0.125  Losartan 50 daily Cleda DaubSpiro 25 daily Ivabradine 2.5 bid Allopurinol 200 daily KCL 20 bid   Kenric Ginger,MD 7:09 AM Advanced Heart Failure Team Pager [phone] (M-F; 7a - 4p)  Please contact [facility] for night-coverage after hours (4p -7a ) and weekends on [email or web address]

## 2014-08-19 ENCOUNTER — Encounter: Payer: Self-pay | Admitting: Internal Medicine

## 2014-08-20 ENCOUNTER — Encounter (HOSPITAL_COMMUNITY): Payer: Self-pay | Admitting: Internal Medicine

## 2014-08-21 ENCOUNTER — Ambulatory Visit (HOSPITAL_COMMUNITY)
Admit: 2014-08-21 | Discharge: 2014-08-21 | Disposition: A | Discharge status: Home / Self Care | Payer: Medicare Other | Attending: Internal Medicine | Admitting: Internal Medicine

## 2014-08-21 ENCOUNTER — Encounter (HOSPITAL_COMMUNITY): Payer: Self-pay

## 2014-08-21 ENCOUNTER — Telehealth (HOSPITAL_COMMUNITY): Payer: Self-pay | Admitting: Surgery

## 2014-08-21 ENCOUNTER — Other Ambulatory Visit (HOSPITAL_COMMUNITY): Payer: Self-pay | Admitting: Cardiology

## 2014-08-21 VITALS — BP 128/80 | HR 74 | Wt 263.0 lb

## 2014-08-21 DIAGNOSIS — Z794 Long term (current) use of insulin: Secondary | ICD-10-CM | POA: Insufficient documentation

## 2014-08-21 DIAGNOSIS — I472 Ventricular tachycardia: Secondary | ICD-10-CM

## 2014-08-21 DIAGNOSIS — I4729 Other ventricular tachycardia: Secondary | ICD-10-CM

## 2014-08-21 DIAGNOSIS — Z7982 Long term (current) use of aspirin: Secondary | ICD-10-CM | POA: Diagnosis not present

## 2014-08-21 DIAGNOSIS — E119 Type 2 diabetes mellitus without complications: Secondary | ICD-10-CM | POA: Insufficient documentation

## 2014-08-21 DIAGNOSIS — I5022 Chronic systolic (congestive) heart failure: Secondary | ICD-10-CM | POA: Insufficient documentation

## 2014-08-21 DIAGNOSIS — Z9989 Dependence on other enabling machines and devices: Secondary | ICD-10-CM

## 2014-08-21 DIAGNOSIS — J45909 Unspecified asthma, uncomplicated: Secondary | ICD-10-CM | POA: Diagnosis not present

## 2014-08-21 DIAGNOSIS — G4733 Obstructive sleep apnea (adult) (pediatric): Secondary | ICD-10-CM | POA: Insufficient documentation

## 2014-08-21 DIAGNOSIS — I519 Heart disease, unspecified: Secondary | ICD-10-CM

## 2014-08-21 DIAGNOSIS — Z79899 Other long term (current) drug therapy: Secondary | ICD-10-CM | POA: Diagnosis not present

## 2014-08-21 DIAGNOSIS — I10 Essential (primary) hypertension: Secondary | ICD-10-CM | POA: Diagnosis not present

## 2014-08-21 DIAGNOSIS — I5023 Acute on chronic systolic (congestive) heart failure: Secondary | ICD-10-CM

## 2014-08-21 DIAGNOSIS — I429 Cardiomyopathy, unspecified: Secondary | ICD-10-CM | POA: Diagnosis not present

## 2014-08-21 LAB — BASIC METABOLIC PANEL
ANION GAP: 13 (ref 5–15)
BUN: 22 mg/dL (ref 6–23)
CO2: 31 mEq/L (ref 19–32)
Calcium: 9 mg/dL (ref 8.4–10.5)
Chloride: 95 mEq/L — ABNORMAL LOW (ref 96–112)
Creatinine, Ser: 0.98 mg/dL (ref 0.50–1.10)
GFR calc Af Amer: 78 mL/min — ABNORMAL LOW (ref >90)
GFR, EST NON AFRICAN AMERICAN: 67 mL/min — AB (ref >90)
Glucose, Bld: 246 mg/dL — ABNORMAL HIGH (ref 70–99)
Potassium: 4.5 mEq/L (ref 3.7–5.3)
Sodium: 139 mEq/L (ref 137–147)

## 2014-08-21 LAB — PRO B NATRIURETIC PEPTIDE: Pro B Natriuretic peptide (BNP): 171.9 pg/mL — ABNORMAL HIGH (ref 0–125)

## 2014-08-21 MED ORDER — TORSEMIDE 20 MG PO TABS: ORAL_TABLET | ORAL | Status: DC

## 2014-08-21 MED ORDER — IVABRADINE HCL 5 MG PO TABS: 2.5000 mg | ORAL_TABLET | Freq: Two times a day (BID) | ORAL | Status: DC

## 2014-08-21 NOTE — Patient Instructions (Signed)
Please take 20 mg extra Torsemide with 3 pound weight gain in 24 hours. Follow up with Advanced Heart Failure Clinic in 4 weeks Labwork today--we will call you with any problems with labs if needed

## 2014-08-21 NOTE — Progress Notes (Signed)
Patient ID: [name], female   DOB: Jun 19, 1966, 48 y.o.   MRN: [id number] Primary Cardiologist: Dr. Ladona Ridgelaylor EP: Dr Ladona Ridgelaylor PCP: Dr Sudie BaileyKnowlton  HPI: [name] is a 48 yo female with a history of NICM s/p BiV ICD 2007 [facility]([employer]), chronic systolic HF, DM2, HTN, morbid obesity and OSA on CPAP.   [facility] with progressive DOE.and volume overload.  [facility] due to acute decompensated HF. ECHO EF 35% by echo but difficult study.  Admitted 08/05/14 after ICD shock for V fib. Also diuresed with IV lasix and transitioned back to torsemide. Also with thyromegaly and had ultrasound with small cysts noted. Discharge weight was 249 pounds   She returns for post hospital follow up. Denies SOB/PND/Orthopnea. Weight at home 254-255 pounds. Using CPAP. Taking all medications. [facility] following for HHRN and HHPT.   [facility] 12/12/13  RA 38/37 (35)  RV 90/28  PA 95/51 (71)  PCWP 57/73 (54)  LV 127/50  AO 123/66 (100)  Oxygen saturations:  PA 40%  AO 96%  Cardiac Output/Index (Fick) 3.2/1.4, PVR 5.3  Normal coronary anatomy  CPX 6/15 FVC 1.34 (42%)  FEV1 1.11 (43%)  FEV1/FVC 83%  MVV 42 (40%) RPE: 19 Resting HR: 80 Peak HR: 115 (67% age predicted max HR) BP rest: 102/70 BP peak: 118/68 Peak VO2: 9.3 (52.9% predicted peak VO2) VE/VCO2 slope: 29.3 OUES: 1.57 Peak RER: 0.95 Ventilatory Threshold: 7.7 (43.8% predicted peak VO2) Peak RR 44 Peak Ventilation: 32 VE/MVV: 76.2% PETCO2 at peak: 41 O2pulse: 10 (77% predicted O2pulse)  ECHO 12/09/13 EF 35% echo but difficult study.   Labs 11/28/13 Pro BNP 948  Labs 12/23/13 Dig level 0.6, K 4.6,  Creatinine 0.86 , Pro BNP 248 Labs 02/23/14 K 3.2 Cr 0.69 Labs 8/15 K 3.7, creatinine 0.78  FH: Father alive with HF   SH: No ETOH and does not smoke, lives in [place] with husband and 48 yr old daughter   ROS: All systems negative except as listed in HPI, PMH and Problem List.  Past Medical History   Diagnosis Date  . CHF (congestive heart failure)     a. EF 30-35%, RV mildly dilated (difficult study 11/2013) b. RHC (12/2013): RA 38/37 (35), RV 90/28, PA 95/51 (71), PCWP 54, PA 40%, AO 96 %, CO/CI Fick 3.2/1.4, PVR 5.3   . Nonischemic cardiomyopathy 12/2013    a. LHC (12/2013) normal coronary anatomy   . Mitral regurgitation     moderate to severe  . S/P cardiac catheterization   . LBBB (left bundle branch block)     s/p [employer] CRT-D  . HTN (hypertension)   . Asthma   . IBS (irritable bowel syndrome)     with primarily constipation  . Morbid obesity   . GERD (gastroesophageal reflux disease)   . IDA (iron deficiency anemia)     2o TO SB AVMS, Hx parenteral iron Dr Mariel SleetNeijstrom  . AVM (arteriovenous malformation)     Small bowel, s/p duble balloon enteroscopy/[facility]jun[facility] 01/23/2011  . Anemia, chronic disease   . Peripheral neuropathy   . OSA (obstructive sleep apnea)     on CPAP qhs  . GI bleed 06/2009    Hgb 7.5, ferritin 7, transfusion, iron IV  . Dyspnea on exertion     chronic  . Diabetes mellitus without complication   . ICD (implantable cardioverter-defibrillator), single, in situ 07/28/2014 interrogation    Pt shocked 3x since 07/20/14---pt intentionally  unplugged Latitude, remote received 07/27/14 once plugged in. Pt unaware of shock on 07/27/14. Thresholds and sensing consistent with previous device measurements. Lead impedance trends stable over time.     Current Outpatient Prescriptions  Medication Sig Dispense Refill  . albuterol (PROVENTIL HFA;VENTOLIN HFA) 108 (90 BASE) MCG/ACT inhaler Inhale 2 puffs into the lungs every 6 (six) hours as needed for wheezing or shortness of breath.    Marland Kitchen albuterol (PROVENTIL) (5 MG/ML) 0.5% nebulizer solution Take 0.5 mLs (2.5 mg total) by nebulization every 4 (four) hours as needed for wheezing or shortness of breath. 20 mL 11  . allopurinol (ZYLOPRIM) 300 MG tablet Take 1 tablet (300 mg total) by mouth  daily. 30 tablet 6  . amiodarone (PACERONE) 200 MG tablet Take 400 mg three times a day 12/4 through 12/5 then 400 mg twice a day 12/6 through 12/20 then 200 mg twice a day for one month 126 tablet 3  . aspirin EC 81 MG EC tablet Take 1 tablet (81 mg total) by mouth daily. 30 tablet 3  . carvedilol (COREG) 6.25 MG tablet Take 1 tablet (6.25 mg total) by mouth 2 (two) times daily with a meal. 60 tablet 3  . cetirizine (ZYRTEC) 10 MG tablet Take 10 mg by mouth daily as needed for allergies.     . cyclobenzaprine (FLEXERIL) 10 MG tablet Take 10 mg by mouth 3 (three) times daily as needed for muscle spasms.     . digoxin (LANOXIN) 0.125 MG tablet Take 1 tablet (0.125 mg total) by mouth daily. 90 tablet 3  . ferrous sulfate 325 (65 FE) MG tablet Take 325 mg by mouth 3 (three) times daily.    Marland Kitchen gabapentin (NEURONTIN) 300 MG capsule Take 600 mg by mouth 4 (four) times daily.    Marland Kitchen HYDROcodone-acetaminophen (NORCO) 10-325 MG per tablet Take 1 tablet by mouth every 4 (four) hours as needed for moderate pain.     Marland Kitchen insulin aspart (NOVOLOG) 100 UNIT/ML injection Inject 0-20 Units into the skin 3 (three) times daily with meals. 1 vial 5  . insulin detemir (LEVEMIR) 100 UNIT/ML injection Inject 100-110 Units into the skin at bedtime.     . ivabradine HCl (CORLANOR) 5 MG TABS tablet Take 0.5 tablets (2.5 mg total) by mouth 2 (two) times daily with a meal. 60 tablet   . losartan (COZAAR) 50 MG tablet Take 1 tablet (50 mg total) by mouth daily. 90 tablet 3  . omeprazole (PRILOSEC) 40 MG capsule Take 1 capsule (40 mg total) by mouth daily. 30 capsule 6  . potassium chloride SA (K-DUR,KLOR-CON) 20 MEQ tablet Take 1 tablet (20 mEq total) by mouth 2 (two) times daily. Or as instructed when to take extra. 190 tablet 3  . spironolactone (ALDACTONE) 25 MG tablet Take 1 tablet (25 mg total) by mouth daily. 30 tablet 3  . temazepam (RESTORIL) 30 MG capsule Take 30 mg by mouth at bedtime.    . torsemide (DEMADEX) 20 MG  tablet Take 3 tablets (60 mg total) by mouth 2 (two) times daily. 180 tablet 3  . vitamin E 400 UNIT capsule Take 400 Units by mouth daily.     No current facility-administered medications for this encounter.    Filed Vitals:   08/21/14 1057  BP: 128/80  Pulse: 74  Weight: 263 lb (119.296 kg)  SpO2: 98%    PHYSICAL EXAM: General: Obese woman. No resp difficulty. husband present HEENT: normal Neck: Thick. JVP 7-8 cm.  Carotids 2+ bilaterally;  no bruits. No lymphadenopathy or thryomegaly appreciated. Cor: PMI normal. Regular rate & rhythm. No rubs, +S3  2/6 TR. Lungs: clear Abdomen: obese, soft, nontender, nondistended. No hepatosplenomegaly. No bruits or masses. Good bowel sounds. Extremities: no cyanosis, clubbing, rash.  R and LLE trace  edema.  Neuro: alert & orientedx3, cranial nerves grossly intact. Moves all 4 extremities w/o difficulty. Affect pleasant.  ASSESSMENT & PLAN:  1. Chronic Systolic Heart Failure: Nonischemic cardiomyopathy, s/p [employer] CRT-D. EF 35% (11/2013). CPX in 6/15 showed mild cardiac limitation but main issue seemed to be severe ventilatory limitation due to obesity and restrictive physiology  Reviewed recent d/c summary.  CPX in 6/15 showed mild cardiac limitation but main issue seemed to be severe ventilatory limitation due to obesity and restrictive physiology She remains NYHA IIIb.  Volume status stable. Continue torsemide to 60 mg twice a day.Continue 25 mg spironolactone daily.   - Continue coreg with continued fatigue, continue 6.25 mg BID and digoxin 0.125 mg daily -Continue  ivabridine 2.5 mg bid  - Continue losartan 50 mg dialy not on an Ace due to cough - Reinforced the need and importance of daily weights, a low sodium diet, and fluid restriction (less than 2 L a day). Instructed to call the HF clinic if weight increases more than 3 lbs overnight or 5 lbs in a week.  2. HTN: Stable. Continue current medications.  3. OSA: Using CPAP.   4. Morbid Obesity: She is going to consider bariatric surgery.  Needs watch portions.  5. DM- has follow up Dr [name]. BMET glucose elevated she was called and asked to follow up with PCP.    Follow up in 4 weeks.  Canesha Tesfaye NP-C  08/21/2014

## 2014-08-21 NOTE — Telephone Encounter (Signed)
Called to inform patient about elevated glucose.  Asked her to follow-up with her primary care provider regarding that result.  She acknowledges understanding.

## 2014-08-25 NOTE — Addendum Note (Signed)
Encounter addended by: Andrey Cota, RN on: 08/25/2014  3:49 PM<BR>     Documentation filed: Notes Section

## 2014-08-25 NOTE — Progress Notes (Signed)
Patient discharged from [facility] today, May 01, 2014 due to stopping after 15 sessions.

## 2014-08-26 ENCOUNTER — Telehealth: Payer: Self-pay | Admitting: Cardiology

## 2014-08-27 ENCOUNTER — Encounter: Payer: Self-pay | Admitting: Internal Medicine

## 2014-08-27 NOTE — Telephone Encounter (Signed)
Remote received. Pt was shocked x2 for PVC induced VT degenerating into VF. First shock of 31J failed, device attempted 2nd shock at 41J--successfully brought pt back into sinus rhythm. Pt felt nervous and jittery after regaining consciousness. States her BP is highest it's been in 9 yrs---135/118 then 152/118. Pt takes amiodarone as instructed.   States she felt like a vertigo episode occurred. States it only happens while in bathroom. Pt having anxiety now---afraid to go to sleep and afraid to use the bathroom.   Pt understands to go to ER if shocked again and symptoms (SOB, palps, pre/syncope) do not dissipate.   ROV w/ Dr. [name] in [place] (not [place]) 09/17/14.  Pt aware no driving Z1IR.

## 2014-08-27 NOTE — Telephone Encounter (Signed)
Returned call to patient she stated she had a episode last night she became dizzy,sob and blacked out for less than .Stated she don't feel like her ICD discharged. Stated she feels better this morning.Stated she has appointment with Dr.Taylor 09/17/14.Advised will send message to Dr.Taylor's nurse and will send message to device clinic for a download.

## 2014-08-27 NOTE — Telephone Encounter (Signed)
No remote received yet. I called pt, requested her to send a manual transmission. Pt agreed stating she would do so after hanging up.

## 2014-08-27 NOTE — Telephone Encounter (Signed)
48 yo female with nonischemic CMP, biVe ICD calling regarding syncopal event. Was in bathroom, on toilet, talking on phone when she felt lightheaded and sweaty. Was found by significant other on the floor. No injuries. No chest pain. Feels back to baseline. Glucose of ~200s. BPs of 140s. Denies feeling shocks.  Recent admission for recurrents shocks. Pt reluctant to come to the ER. Recommended that it ANYthing worsened (felt worse, felt shocked, chest pain, etc), she needed to seek emergency care. Agreeable to plan, to call clinic in the AM. If feasible, plan for remote download to check for ICD firing today.

## 2014-09-03 ENCOUNTER — Other Ambulatory Visit (HOSPITAL_COMMUNITY): Payer: Self-pay | Admitting: Cardiology

## 2014-09-03 MED ORDER — SPIRONOLACTONE 25 MG PO TABS
25.0000 mg | ORAL_TABLET | Freq: Every day | ORAL | Status: DC
Start: 2014-09-03 — End: 2015-01-01

## 2014-09-17 ENCOUNTER — Encounter: Payer: Self-pay | Admitting: Internal Medicine

## 2014-09-17 ENCOUNTER — Ambulatory Visit (INDEPENDENT_AMBULATORY_CARE_PROVIDER_SITE_OTHER): Payer: Medicare Other | Admitting: Internal Medicine

## 2014-09-17 VITALS — BP 88/64 | HR 68 | Ht 65.0 in | Wt 262.4 lb

## 2014-09-17 DIAGNOSIS — Z95810 Presence of automatic (implantable) cardiac defibrillator: Secondary | ICD-10-CM

## 2014-09-17 DIAGNOSIS — I519 Heart disease, unspecified: Secondary | ICD-10-CM

## 2014-09-17 DIAGNOSIS — I472 Ventricular tachycardia: Secondary | ICD-10-CM

## 2014-09-17 DIAGNOSIS — I5041 Acute combined systolic (congestive) and diastolic (congestive) heart failure: Secondary | ICD-10-CM

## 2014-09-17 DIAGNOSIS — I4729 Other ventricular tachycardia: Secondary | ICD-10-CM

## 2014-09-17 LAB — MDC_IDC_ENUM_SESS_TYPE_INCLINIC
Date Time Interrogation Session: 20160107050000
HighPow Impedance: 39 Ohm
HighPow Impedance: 50 Ohm
Implantable Pulse Generator Serial Number: 108094
Lead Channel Impedance Value: 1081 Ohm
Lead Channel Impedance Value: 485 Ohm
Lead Channel Impedance Value: 487 Ohm
Lead Channel Pacing Threshold Amplitude: 0.7 V
Lead Channel Pacing Threshold Amplitude: 1 V
Lead Channel Pacing Threshold Amplitude: 1.5 V
Lead Channel Pacing Threshold Pulse Width: 0.3 ms
Lead Channel Pacing Threshold Pulse Width: 0.5 ms
Lead Channel Pacing Threshold Pulse Width: 0.8 ms
Lead Channel Sensing Intrinsic Amplitude: 0.9 mV
Lead Channel Sensing Intrinsic Amplitude: 1.8 mV
Lead Channel Sensing Intrinsic Amplitude: 18.6 mV
Lead Channel Setting Pacing Amplitude: 2 V
Lead Channel Setting Pacing Amplitude: 2.2 V
Lead Channel Setting Pacing Amplitude: 2.5 V
Lead Channel Setting Pacing Pulse Width: 0.3 ms
Lead Channel Setting Pacing Pulse Width: 0.8 ms
Lead Channel Setting Sensing Sensitivity: 0.5 mV
Lead Channel Setting Sensing Sensitivity: 1 mV
Zone Setting Detection Interval: 250 ms
Zone Setting Detection Interval: 300 ms
Zone Setting Detection Interval: 343 ms

## 2014-09-17 NOTE — Patient Instructions (Signed)
Your physician wants you to follow-up in: 6 months with Dr. Court Joy will receive a reminder letter in the mail two months in advance. If you don't receive a letter, please call our office to schedule the follow-up appointment.  Remote monitoring is used to monitor your Pacemaker of ICD from home. This monitoring reduces the number of office visits required to check your device to one time per year. It allows [place] to keep an eye on the functioning of your device to ensure it is working properly. You are scheduled for a device check from home on 12/17/14. You may send your transmission at any time that day. If you have a wireless device, the transmission will be sent automatically. After your physician reviews your transmission, you will receive a postcard with your next transmission date.

## 2014-09-17 NOTE — Progress Notes (Signed)
HPI [name] returns today for followup of recurrent VT. She is over a year out from her BiV ICD implant. She has had multiple ICD shocks in the past several weeks.. She denies anginal symptoms.   Her heart failure symptoms have worsened. She c/o recurrent ICD shock, with the last shock occurring in December. She notes that her blood pressure runs low usually and she will often have to stop what she is doing to rest. Denies peripheral edema.  Allergies  Allergen Reactions  . Cymbalta [Duloxetine Hcl] Other (See Comments)    "Spontaneous type behavior"  . Trazodone And Nefazodone Other (See Comments)    Nightmares   . Lyrica [Pregabalin] Swelling     Current Outpatient Prescriptions  Medication Sig Dispense Refill  . albuterol (PROVENTIL HFA;VENTOLIN HFA) 108 (90 BASE) MCG/ACT inhaler Inhale 2 puffs into the lungs every 6 (six) hours as needed for wheezing or shortness of breath.    Marland Kitchen albuterol (PROVENTIL) (5 MG/ML) 0.5% nebulizer solution Take 0.5 mLs (2.5 mg total) by nebulization every 4 (four) hours as needed for wheezing or shortness of breath. 20 mL 11  . allopurinol (ZYLOPRIM) 300 MG tablet Take 1 tablet (300 mg total) by mouth daily. 30 tablet 6  . amiodarone (PACERONE) 200 MG tablet Take 400 mg three times a day 12/4 through 12/5 then 400 mg twice a day 12/6 through 12/20 then 200 mg twice a day for one month 126 tablet 3  . aspirin EC 81 MG EC tablet Take 1 tablet (81 mg total) by mouth daily. 30 tablet 3  . carvedilol (COREG) 6.25 MG tablet Take 1 tablet (6.25 mg total) by mouth 2 (two) times daily with a meal. 60 tablet 3  . cetirizine (ZYRTEC) 10 MG tablet Take 10 mg by mouth daily as needed for allergies.     . cyclobenzaprine (FLEXERIL) 10 MG tablet Take 10 mg by mouth 3 (three) times daily as needed for muscle spasms.     . digoxin (LANOXIN) 0.125 MG tablet Take 1 tablet (0.125 mg total) by mouth daily. 90 tablet 3  . ferrous sulfate 325 (65 FE) MG tablet Take 325 mg by  mouth 3 (three) times daily.    Marland Kitchen gabapentin (NEURONTIN) 300 MG capsule Take 600 mg by mouth 4 (four) times daily.    Marland Kitchen HYDROcodone-acetaminophen (NORCO) 10-325 MG per tablet Take 1 tablet by mouth every 4 (four) hours as needed for moderate pain.     Marland Kitchen insulin aspart (NOVOLOG) 100 UNIT/ML injection Inject 0-20 Units into the skin 3 (three) times daily with meals. 1 vial 5  . insulin detemir (LEVEMIR) 100 UNIT/ML injection Inject 100-110 Units into the skin at bedtime.     . ivabradine (CORLANOR) 5 MG TABS tablet Take 0.5 tablets (2.5 mg total) by mouth 2 (two) times daily with a meal. 60 tablet 2  . losartan (COZAAR) 50 MG tablet Take 1 tablet (50 mg total) by mouth daily. 90 tablet 3  . omeprazole (PRILOSEC) 40 MG capsule Take 1 capsule (40 mg total) by mouth daily. 30 capsule 6  . potassium chloride SA (K-DUR,KLOR-CON) 20 MEQ tablet Take 1 tablet (20 mEq total) by mouth 2 (two) times daily. Or as instructed when to take extra. 190 tablet 3  . spironolactone (ALDACTONE) 25 MG tablet Take 1 tablet (25 mg total) by mouth daily. 30 tablet 3  . temazepam (RESTORIL) 30 MG capsule Take 30 mg by mouth at bedtime.    . torsemide (DEMADEX) 20 MG  tablet 60 mg daily and extra 20 mg if you have a 3 pound weight gain in 24 hours 200 tablet 3  . vitamin E 400 UNIT capsule Take 400 Units by mouth daily.     No current facility-administered medications for this visit.     Past Medical History  Diagnosis Date  . CHF (congestive heart failure)     a. EF 30-35%, RV mildly dilated (difficult study 11/2013) b. RHC (12/2013): RA 38/37 (35), RV 90/28, PA 95/51 (71), PCWP 54, PA 40%, AO 96 %, CO/CI Fick 3.2/1.4, PVR 5.3   . Nonischemic cardiomyopathy 12/2013    a. LHC (12/2013) normal coronary anatomy   . Mitral regurgitation     moderate to severe  . S/P cardiac catheterization   . LBBB (left bundle branch block)     s/p [employer] CRT-D  . HTN (hypertension)   . Asthma   . IBS (irritable bowel  syndrome)     with primarily constipation  . Morbid obesity   . GERD (gastroesophageal reflux disease)   . IDA (iron deficiency anemia)     2o TO SB AVMS, Hx parenteral iron Dr Mariel Sleet  . AVM (arteriovenous malformation)     Small bowel, s/p duble balloon enteroscopy/APC jejunum Dr Gwinda Passe [facility]) 01/23/2011  . Anemia, chronic disease   . Peripheral neuropathy   . OSA (obstructive sleep apnea)     on CPAP qhs  . GI bleed 06/2009    Hgb 7.5, ferritin 7, transfusion, iron IV  . Dyspnea on exertion     chronic  . Diabetes mellitus without complication   . ICD (implantable cardioverter-defibrillator), single, in situ 07/28/2014 interrogation    Pt shocked 3x since 07/20/14---pt intentionally unplugged Latitude, remote received 07/27/14 once plugged in. Pt unaware of shock on 07/27/14. Thresholds and sensing consistent with previous device measurements. Lead impedance trends stable over time.     ROS:   All systems reviewed and negative except as noted in the HPI.   Past Surgical History  Procedure Laterality Date  . Crdt-implantation  4/07    [employer]. remote- yes  . Appendectomy    . Cholecystectomy      biliary dyskinesia  . Mastectomy  2003    left, partial  . Knee arthroscopy  2005    left  . Small bowel enteroscopy  MAY 2012 [facility] DR. GILLIAM    SB AVMS s/p APC  . Colonoscopy  OCT 2010/SEP 2011    tortuous colon, 3 polyps-benign(2010),   . Upper gastrointestinal endoscopy  '08, '10, SEP 2011    mild antral gastritis (2010), negative SB bx (2010), incomlpete Schatzki's ring (2011)  . Small bowel enteroscopy  SEP 2011 [facility]    Fields-NO AVMS  . Givens capsule study  NOV 2010     NO AVMS, normal  . Irrigation and debridement abscess  06/25/2012    Procedure: MINOR INCISION AND DRAINAGE OF ABSCESS;  Surgeon: Barbara Hatcher, MD;  Location: [facility] ORS;  Service: General;  Laterality: [name];  Incision & Drainage of Infected Sebaceous Cyst on Chest  . Breast  surgery    . Cardiac defibrillator placement      [employer]  . Implantable cardioverter defibrillator generator change Left 02/15/2012    Procedure: IMPLANTABLE CARDIOVERTER DEFIBRILLATOR GENERATOR CHANGE;  Surgeon: Marinus Maw, MD;  Location[facility] CATH LAB;  Service: Cardiovascular;  Laterality: Left;  . Left and right heart catheterization with coronary/graft angiogram  12/12/2013    Procedure:  LEFT AND RIGHT HEART CATHETERIZATION WITH Isabel Caprice;  Surgeon: Peter M [place]aziland, MD;  Location: [facility] CATH LAB;  Service: Cardiovascular;;     Family History  Problem Relation Age of Onset  . Colon cancer Neg Hx     no family Hx of polyps too, uncle  . Cervical cancer Mother   . Hypertension Mother   . Cancer Mother   . Heart disease Father   . Hypertension Father   . GER disease Father   . Heart attack Father   . Bleeding Disorder Father   . Diabetes Sister   . Heart disease Sister   . Hypertension Sister      History   Social History  . Marital Status: Single    Spouse Name: [name]    Number of Children: 1  . Years of Education: [name]   Occupational History  . disabled    Social History Main Topics  . Smoking status: Former Smoker -- 0.50 packs/day for 20 years    Types: Cigarettes    Quit date: 11/19/2009  . Smokeless tobacco: Former Neurosurgeon  . Alcohol Use: No  . Drug Use: No  . Sexual Activity: Yes    Birth Control/ Protection: Implant   Other Topics Concern  . 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   Single- 1 daughter age 32.    Does not get regular exercise.       Ht  (1.651 m)  Wt 262 lb 6.4 oz (119.024 kg)  BMI 43.67 kg/m2  LMP 06/21/2012  Physical Exam:  obese appearing 49 year old woman, NAD HEENT: Unremarkable Neck:  6 cm JVD, no thyromegally Back:  No CVA tenderness Lungs:  Clear with no wheezes, rales, or rhonchi. HEART:  Regular rate rhythm, distant, no murmurs, no rubs, no clicks,  heart sounds are distant Abd:  soft, positive bowel sounds, no organomegally, no rebound, no guarding Ext:  2 plus pulses, no edema, no cyanosis, no clubbing Skin:  No rashes no nodules Neuro:  CN II through XII intact, motor grossly intact   DEVICE  Normal device function.  See PaceArt for details.   Assess/Plan:

## 2014-09-17 NOTE — Assessment & Plan Note (Signed)
Her chronic systolic heart failure appears to be well compensated at the current time. She will continue her current medical therapy.

## 2014-09-17 NOTE — Assessment & Plan Note (Signed)
Her [employer] biventricular ICD is working normally. She is approximately 5 and half years of battery longevity.

## 2014-09-17 NOTE — Assessment & Plan Note (Signed)
Her last episode of ventricular tachycardia occurred in the middle December. She will continue her high dose amiodarone therapy for now, and reduce the amiodarone as previously scheduled. She is currently taking 200 mg twice a day. We discussed additional antiarrhythmic drug therapy, as well as catheter ablation. For now we'll continue amiodarone.

## 2014-09-17 NOTE — Assessment & Plan Note (Signed)
Her blood pressure is now low. I checked today at 88/62. She will continue her current medical therapy.

## 2014-10-05 ENCOUNTER — Encounter: Payer: Medicare Other | Admitting: Internal Medicine

## 2014-10-19 ENCOUNTER — Other Ambulatory Visit: Payer: Self-pay

## 2014-10-19 MED ORDER — CARVEDILOL 6.25 MG PO TABS
6.2500 mg | ORAL_TABLET | Freq: Two times a day (BID) | ORAL | Status: DC
Start: 2014-10-19 — End: 2015-01-26

## 2014-11-09 ENCOUNTER — Other Ambulatory Visit (HOSPITAL_COMMUNITY): Payer: Self-pay

## 2014-11-09 ENCOUNTER — Telehealth (HOSPITAL_COMMUNITY): Payer: Self-pay | Admitting: Vascular Surgery

## 2014-11-09 DIAGNOSIS — I5023 Acute on chronic systolic (congestive) heart failure: Secondary | ICD-10-CM

## 2014-11-09 MED ORDER — TORSEMIDE 20 MG PO TABS: 60.0000 mg | ORAL_TABLET | Freq: Two times a day (BID) | ORAL | Status: DC

## 2014-11-09 NOTE — Telephone Encounter (Signed)
Pt called she is out of her torsemide, her dosage was increased but the prescription was not changed, pt wants to speak to a nurse to clarify the change.. Please advise

## 2014-11-09 NOTE — Telephone Encounter (Signed)
After reviewing patient's most recent note with our office, patient should be on Torsemide 60mg  twice daily, which is what she is doing at home.  Her Rx was never updated to her pharmacy.  Updated Rx sent to preferred pharmacy electronically.  Also overdue for appointment, was supposed to have a 1 month follow up in January.  Scheduled for next available.  Aware and appreciative.  Ave Filter

## 2014-11-23 ENCOUNTER — Encounter (HOSPITAL_COMMUNITY): Payer: Self-pay

## 2014-11-23 ENCOUNTER — Ambulatory Visit (HOSPITAL_COMMUNITY)
Admission: RE | Admit: 2014-11-23 | Discharge: 2014-11-23 | Disposition: A | Discharge status: Home / Self Care | Payer: Medicare Other | Source: Ambulatory Visit | Attending: Cardiology | Admitting: Cardiology

## 2014-11-23 VITALS — BP 116/72 | HR 70 | Wt 270.0 lb

## 2014-11-23 DIAGNOSIS — I429 Cardiomyopathy, unspecified: Secondary | ICD-10-CM | POA: Diagnosis not present

## 2014-11-23 DIAGNOSIS — I4729 Other ventricular tachycardia: Secondary | ICD-10-CM

## 2014-11-23 DIAGNOSIS — D638 Anemia in other chronic diseases classified elsewhere: Secondary | ICD-10-CM | POA: Insufficient documentation

## 2014-11-23 DIAGNOSIS — Z95810 Presence of automatic (implantable) cardiac defibrillator: Secondary | ICD-10-CM | POA: Insufficient documentation

## 2014-11-23 DIAGNOSIS — K219 Gastro-esophageal reflux disease without esophagitis: Secondary | ICD-10-CM | POA: Insufficient documentation

## 2014-11-23 DIAGNOSIS — Z794 Long term (current) use of insulin: Secondary | ICD-10-CM | POA: Diagnosis not present

## 2014-11-23 DIAGNOSIS — Z7982 Long term (current) use of aspirin: Secondary | ICD-10-CM | POA: Diagnosis not present

## 2014-11-23 DIAGNOSIS — G4733 Obstructive sleep apnea (adult) (pediatric): Secondary | ICD-10-CM | POA: Insufficient documentation

## 2014-11-23 DIAGNOSIS — R29898 Other symptoms and signs involving the musculoskeletal system: Secondary | ICD-10-CM

## 2014-11-23 DIAGNOSIS — E119 Type 2 diabetes mellitus without complications: Secondary | ICD-10-CM | POA: Diagnosis not present

## 2014-11-23 DIAGNOSIS — Z79899 Other long term (current) drug therapy: Secondary | ICD-10-CM | POA: Diagnosis not present

## 2014-11-23 DIAGNOSIS — I10 Essential (primary) hypertension: Secondary | ICD-10-CM | POA: Insufficient documentation

## 2014-11-23 DIAGNOSIS — I5023 Acute on chronic systolic (congestive) heart failure: Secondary | ICD-10-CM | POA: Diagnosis not present

## 2014-11-23 DIAGNOSIS — R531 Weakness: Secondary | ICD-10-CM | POA: Insufficient documentation

## 2014-11-23 DIAGNOSIS — I472 Ventricular tachycardia, unspecified: Secondary | ICD-10-CM

## 2014-11-23 DIAGNOSIS — I5022 Chronic systolic (congestive) heart failure: Secondary | ICD-10-CM | POA: Insufficient documentation

## 2014-11-23 LAB — COMPREHENSIVE METABOLIC PANEL
ALT: 19 U/L (ref 0–35)
AST: 21 U/L (ref 0–37)
Albumin: 3.3 g/dL — ABNORMAL LOW (ref 3.5–5.2)
Alkaline Phosphatase: 76 U/L (ref 39–117)
Anion gap: 11 (ref 5–15)
BUN: 14 mg/dL (ref 6–23)
CALCIUM: 8.6 mg/dL (ref 8.4–10.5)
CO2: 29 mmol/L (ref 19–32)
Chloride: 98 mmol/L (ref 96–112)
Creatinine, Ser: 1.09 mg/dL (ref 0.50–1.10)
GFR calc Af Amer: 68 mL/min — ABNORMAL LOW (ref >90)
GFR, EST NON AFRICAN AMERICAN: 59 mL/min — AB (ref >90)
GLUCOSE: 197 mg/dL — AB (ref 70–99)
POTASSIUM: 3.9 mmol/L (ref 3.5–5.1)
Sodium: 138 mmol/L (ref 135–145)
TOTAL PROTEIN: 7.7 g/dL (ref 6.0–8.3)
Total Bilirubin: 0.6 mg/dL (ref 0.3–1.2)

## 2014-11-23 LAB — DIGOXIN LEVEL: Digoxin Level: 0.9 ng/mL (ref 0.8–2.0)

## 2014-11-23 LAB — TSH: TSH: 4.204 u[IU]/mL (ref 0.350–4.500)

## 2014-11-23 MED ORDER — LOSARTAN POTASSIUM 50 MG PO TABS: ORAL_TABLET | ORAL | Status: DC

## 2014-11-23 MED ORDER — AMIODARONE HCL 200 MG PO TABS: 200.0000 mg | ORAL_TABLET | Freq: Two times a day (BID) | ORAL | Status: DC

## 2014-11-23 MED ORDER — TORSEMIDE 20 MG PO TABS: 80.0000 mg | ORAL_TABLET | Freq: Two times a day (BID) | ORAL | Status: DC

## 2014-11-23 NOTE — Patient Instructions (Signed)
Increase Losartan take 1 tablet a.m and 1/2 tablet p.m  Continue Amiodarone 200mg  twice a day.  Increase Torsemide 80mg  twice a day.  Labs today.  Labs in 10 days in [place]   Echocardiogram in [place]   Lower extremity arterial doppler in [place].  Follow up in 1 month.

## 2014-11-23 NOTE — Progress Notes (Signed)
Patient ID: [name], female   DOB: [birth year], 49 y.o.   MRN: [id number] Primary Cardiologist: Dr. [name] EP: Dr [name] PCP: Dr [name]  HPI: [name] is a 49 yo female with a history of NICM s/p BiV ICD 2007 [facility] [employer]), chronic systolic HF, DM2, HTN, recurrent VT, morbid obesity and OSA on CPAP.   Admitted to [facility] 12/08/13 with progressive DOE.and volume overload.  She was transferred from [facility] to [facility] due to acute decompensated HF. ECHO EF 35% by echo but difficult study.  Admitted 08/05/14 after ICD shock for V fib. Also diuresed with IV lasix and transitioned back to torsemide. Also with thyromegaly and had ultrasound with small cysts noted. Discharge weight was 249 pounds.  She had another ICD shock in 12/15 for VT, now on amiodarone 200 mg bid.   Weight is up 7 lbs.  She is short of breath walking 100 feet or walking up steps or an incline.  She is also limited by leg weakness (no pain). She feels like she is volume overloaded.  3 pillow orthopnea.  She is occasionally lightheaded with standing but not often.  She is unable to tolerate Corlanor 5 mg bid but does ok on 2.5 mg bid.   [facility] 12/12/13  RA 38/37 (35)  RV 90/28  PA 95/51 (71)  PCWP 57/73 (54)  LV 127/50  AO 123/66 (100)  Oxygen saturations:  PA 40%  AO 96%  Cardiac Output/Index (Fick) 3.2/1.4, PVR 5.3  Normal coronary anatomy  CPX 6/15 FVC 1.34 (42%)  FEV1 1.11 (43%)  FEV1/FVC 83%  MVV 42 (40%) RPE: 19 Resting HR: 80 Peak HR: 115 (49% age predicted max HR) BP rest: 102/70 BP peak: 118/68 Peak VO2: 9.3 (52.9% predicted peak VO2) VE/VCO2 slope: 29.3 OUES: 1.57 Peak RER: 0.95 Ventilatory Threshold: 7.7 (43.8% predicted peak VO2) Peak RR 44 Peak Ventilation: 32 VE/MVV: 76.2% PETCO2 at peak: 41 O2pulse: 10 (77% predicted O2pulse)  ECHO 12/09/13 EF 35% echo but difficult study.   Labs 11/28/13 Pro BNP 948  Labs 12/23/13 Dig level 0.6, K 4.6,  Creatinine 0.86 , Pro BNP 248 Labs 02/23/14 K 3.2 Cr  0.69 Labs 8/15 K 3.7, creatinine 0.78 Labs 12/15 K 4.5, creatinine 0.98, BNP 172, TSH normal  FH: Father alive with HF   SH: No ETOH and does not smoke, lives in [place] with husband and 19 yr old daughter   ROS: All systems negative except as listed in HPI, PMH and Problem List.  Past Medical History  Diagnosis Date  . CHF (congestive heart failure)     a. EF 30-35%, RV mildly dilated (difficult study 11/2013) b. RHC (12/2013): RA 38/37 (35), RV 90/28, PA 95/51 (71), PCWP 54, PA 40%, AO 96 %, CO/CI Fick 3.2/1.4, PVR 5.3   . Nonischemic cardiomyopathy 12/2013    a. LHC (12/2013) normal coronary anatomy   . Mitral regurgitation     moderate to severe  . S/P cardiac catheterization   . LBBB (left bundle branch block)     s/p [employer] CRT-D  . HTN (hypertension)   . Asthma   . IBS (irritable bowel syndrome)     with primarily constipation  . Morbid obesity   . GERD (gastroesophageal reflux disease)   . IDA (iron deficiency anemia)     2o TO SB AVMS, Hx parenteral iron Dr Mariel Sleet  . AVM (arteriovenous malformation)     Small bowel, s/p duble balloon enteroscopy/APC jejunum Dr Gwinda Passe [facility]) 01/23/2011  . Anemia,  chronic disease   . Peripheral neuropathy   . OSA (obstructive sleep apnea)     on CPAP qhs  . GI bleed 06/2009    Hgb 7.5, ferritin 7, transfusion, iron IV  . Dyspnea on exertion     chronic  . Diabetes mellitus without complication   . ICD (implantable cardioverter-defibrillator), single, in situ 07/28/2014 interrogation    Pt shocked 3x since 07/20/14---pt intentionally unplugged Latitude, remote received 07/27/14 once plugged in. Pt unaware of shock on 07/27/14. Thresholds and sensing consistent with previous device measurements. Lead impedance trends stable over time.     Current Outpatient Prescriptions  Medication Sig Dispense Refill  . albuterol (PROVENTIL HFA;VENTOLIN HFA) 108 (90 BASE) MCG/ACT inhaler Inhale 2 puffs into the lungs every 6 (six)  hours as needed for wheezing or shortness of breath.    Marland Kitchen albuterol (PROVENTIL) (5 MG/ML) 0.5% nebulizer solution Take 0.5 mLs (2.5 mg total) by nebulization every 4 (four) hours as needed for wheezing or shortness of breath. 20 mL 11  . allopurinol (ZYLOPRIM) 300 MG tablet Take 1 tablet (300 mg total) by mouth daily. 30 tablet 6  . amiodarone (PACERONE) 200 MG tablet Take 1 tablet (200 mg total) by mouth 2 (two) times daily. 60 tablet 3  . aspirin EC 81 MG EC tablet Take 1 tablet (81 mg total) by mouth daily. 30 tablet 3  . carvedilol (COREG) 6.25 MG tablet Take 1 tablet (6.25 mg total) by mouth 2 (two) times daily with a meal. 60 tablet 3  . cetirizine (ZYRTEC) 10 MG tablet Take 10 mg by mouth daily as needed for allergies.     Marland Kitchen digoxin (LANOXIN) 0.125 MG tablet Take 1 tablet (0.125 mg total) by mouth daily. 90 tablet 3  . ferrous sulfate 325 (65 FE) MG tablet Take 325 mg by mouth 3 (three) times daily.    Marland Kitchen gabapentin (NEURONTIN) 300 MG capsule Take 600 mg by mouth 4 (four) times daily.    Marland Kitchen HYDROcodone-acetaminophen (NORCO) 10-325 MG per tablet Take 1 tablet by mouth every 4 (four) hours as needed for moderate pain.     Marland Kitchen insulin aspart (NOVOLOG) 100 UNIT/ML injection Inject 0-20 Units into the skin 3 (three) times daily with meals. 1 vial 5  . insulin detemir (LEVEMIR) 100 UNIT/ML injection Inject 100-110 Units into the skin at bedtime.     . ivabradine (CORLANOR) 5 MG TABS tablet Take 0.5 tablets (2.5 mg total) by mouth 2 (two) times daily with a meal. 60 tablet 2  . losartan (COZAAR) 50 MG tablet Take 1 tablet a.m and 1/2 tablet p.m 135 tablet 3  . omeprazole (PRILOSEC) 40 MG capsule Take 1 capsule (40 mg total) by mouth daily. 30 capsule 6  . potassium chloride SA (K-DUR,KLOR-CON) 20 MEQ tablet Take 1 tablet (20 mEq total) by mouth 2 (two) times daily. Or as instructed when to take extra. 190 tablet 3  . spironolactone (ALDACTONE) 25 MG tablet Take 1 tablet (25 mg total) by mouth daily. 30  tablet 3  . temazepam (RESTORIL) 30 MG capsule Take 30 mg by mouth at bedtime.    . torsemide (DEMADEX) 20 MG tablet Take 4 tablets (80 mg total) by mouth 2 (two) times daily. 180 tablet 3  . vitamin E 400 UNIT capsule Take 400 Units by mouth daily.    . cyclobenzaprine (FLEXERIL) 10 MG tablet Take 10 mg by mouth 3 (three) times daily as needed for muscle spasms.      No  current facility-administered medications for this encounter.    Filed Vitals:   11/23/14 0830  BP: 116/72  Pulse: 70  Weight: 270 lb (122.471 kg)  SpO2: 97%    PHYSICAL EXAM: General: Obese woman. No resp difficulty. husband present HEENT: normal Neck: Thick. JVP 10 cm.  Carotids 2+ bilaterally; no bruits. No lymphadenopathy or thryomegaly appreciated. Cor: PMI normal. Regular rate & rhythm. No rubs, 2/6 HSM LLSB.  Unable to palpate pedal pulses.  Lungs: clear Abdomen: obese, soft, nontender, nondistended. No hepatosplenomegaly. No bruits or masses. Good bowel sounds. Extremities: no cyanosis, clubbing, rash.  R and LLE trace edema.  Neuro: alert & orientedx3, cranial nerves grossly intact. Moves all 4 extremities w/o difficulty. Affect pleasant.  ASSESSMENT & PLAN:  1. Chronic Systolic Heart Failure: Nonischemic cardiomyopathy, s/p [employer] CRT-D. EF 35% (11/2013). CPX in 6/15 showed mild cardiac limitation but main issue seemed to be severe ventilatory limitation due to obesity and restrictive physiology.  NYHA class III symptoms.  She does have some volume overload on exam and is more short of breath than her baseline.   - Increase torsemide to 80 mg bid.  BMET today and again in 10 days (can do in [place] office).  - Increase losartan to 50 qam, 25 qpm.  - Continue current spironolactone, Coreg, digoxin.  Check digoxin level today.   - I will arrange for echocardiogram, can do at [facility] office.  - Reinforced the need and importance of daily weights, a low sodium diet, and fluid restriction (less  than 2 L a day). Instructed to call the HF clinic if weight increases more than 3 lbs overnight or 5 lbs in a week.  2. OSA: Using CPAP.  3. Morbid Obesity: She is going to consider bariatric surgery eventually.  Needs watch portions.  4. VT: Continue amiodarone 200 mg bid => up from qday with recurrent VT and shocks.  Will check LFTs, TSH today.  Needs at least yearly eye exam.  5. Leg weakness: Difficult to palpate pedal pulses.  Will check ABIs.      Follow up in 4 weeks.   [name]  11/23/2014

## 2014-11-25 NOTE — Addendum Note (Signed)
Encounter addended by: Noralee Space, RN on: 11/25/2014 12:18 PM<BR>     Documentation filed: [employer], Orders

## 2014-11-25 NOTE — Addendum Note (Signed)
Encounter addended by: Noralee Space, RN on: 11/25/2014 12:25 PM<BR>     Documentation filed: [employer], Orders

## 2014-11-30 ENCOUNTER — Ambulatory Visit (HOSPITAL_COMMUNITY)
Admission: RE | Admit: 2014-11-30 | Discharge: 2014-11-30 | Disposition: A | Discharge status: Home / Self Care | Payer: Medicare Other | Source: Ambulatory Visit | Attending: Cardiology | Admitting: Cardiology

## 2014-11-30 DIAGNOSIS — I5022 Chronic systolic (congestive) heart failure: Secondary | ICD-10-CM

## 2014-11-30 DIAGNOSIS — I509 Heart failure, unspecified: Secondary | ICD-10-CM | POA: Insufficient documentation

## 2014-11-30 NOTE — Progress Notes (Signed)
  Echocardiogram 2D Echocardiogram has been performed.  Stacey Drain 11/30/2014, 9:16 AM

## 2014-12-02 ENCOUNTER — Ambulatory Visit (HOSPITAL_COMMUNITY): Admission: RE | Admit: 2014-12-02 | Payer: Medicare Other | Source: Ambulatory Visit

## 2014-12-14 DIAGNOSIS — I429 Cardiomyopathy, unspecified: Secondary | ICD-10-CM | POA: Diagnosis not present

## 2014-12-17 ENCOUNTER — Ambulatory Visit (INDEPENDENT_AMBULATORY_CARE_PROVIDER_SITE_OTHER): Payer: Medicare Other | Admitting: *Deleted

## 2014-12-17 DIAGNOSIS — I429 Cardiomyopathy, unspecified: Secondary | ICD-10-CM

## 2014-12-17 LAB — MDC_IDC_ENUM_SESS_TYPE_REMOTE
Battery Remaining Longevity: 5.5
Brady Statistic RA Percent Paced: 0 %
HighPow Impedance: 53 Ohm
Implantable Pulse Generator Serial Number: 108094
Lead Channel Impedance Value: 505 Ohm
Lead Channel Sensing Intrinsic Amplitude: 1.5 mV
Lead Channel Setting Pacing Amplitude: 2 V
Lead Channel Setting Pacing Amplitude: 2.2 V
Lead Channel Setting Pacing Amplitude: 2.5 V
Lead Channel Setting Pacing Pulse Width: 0.3 ms
Lead Channel Setting Sensing Sensitivity: 1 mV
MDC IDC MSMT LEADCHNL LV IMPEDANCE VALUE: 1191 Ohm
MDC IDC MSMT LEADCHNL RA IMPEDANCE VALUE: 485 Ohm
MDC IDC SET LEADCHNL LV PACING PULSEWIDTH: 0.8 ms
MDC IDC SET LEADCHNL RV SENSING SENSITIVITY: 0.5 mV
MDC IDC SET ZONE DETECTION INTERVAL: 250 ms
MDC IDC SET ZONE DETECTION INTERVAL: 300 ms
MDC IDC SET ZONE DETECTION INTERVAL: 343 ms
MDC IDC STAT BRADY RV PERCENT PACED: 99 %

## 2014-12-17 NOTE — Progress Notes (Signed)
Remote ICD transmission.   

## 2014-12-20 ENCOUNTER — Emergency Department (HOSPITAL_COMMUNITY)
Admission: EM | Admit: 2014-12-20 | Discharge: 2014-12-21 | Disposition: A | Discharge status: Home / Self Care | Payer: Medicare Other | Attending: Emergency Medicine | Admitting: Emergency Medicine

## 2014-12-20 ENCOUNTER — Encounter (HOSPITAL_COMMUNITY): Payer: Self-pay | Admitting: *Deleted

## 2014-12-20 ENCOUNTER — Emergency Department (HOSPITAL_COMMUNITY): Payer: Medicare Other

## 2014-12-20 DIAGNOSIS — R0602 Shortness of breath: Secondary | ICD-10-CM | POA: Diagnosis present

## 2014-12-20 DIAGNOSIS — E119 Type 2 diabetes mellitus without complications: Secondary | ICD-10-CM | POA: Insufficient documentation

## 2014-12-20 DIAGNOSIS — I509 Heart failure, unspecified: Secondary | ICD-10-CM

## 2014-12-20 DIAGNOSIS — I10 Essential (primary) hypertension: Secondary | ICD-10-CM | POA: Insufficient documentation

## 2014-12-20 DIAGNOSIS — Z79899 Other long term (current) drug therapy: Secondary | ICD-10-CM | POA: Insufficient documentation

## 2014-12-20 DIAGNOSIS — R05 Cough: Secondary | ICD-10-CM | POA: Insufficient documentation

## 2014-12-20 DIAGNOSIS — Z9981 Dependence on supplemental oxygen: Secondary | ICD-10-CM | POA: Diagnosis not present

## 2014-12-20 DIAGNOSIS — Z95810 Presence of automatic (implantable) cardiac defibrillator: Secondary | ICD-10-CM | POA: Insufficient documentation

## 2014-12-20 DIAGNOSIS — G4733 Obstructive sleep apnea (adult) (pediatric): Secondary | ICD-10-CM | POA: Diagnosis not present

## 2014-12-20 DIAGNOSIS — J45909 Unspecified asthma, uncomplicated: Secondary | ICD-10-CM | POA: Diagnosis not present

## 2014-12-20 DIAGNOSIS — K219 Gastro-esophageal reflux disease without esophagitis: Secondary | ICD-10-CM | POA: Insufficient documentation

## 2014-12-20 DIAGNOSIS — Z87891 Personal history of nicotine dependence: Secondary | ICD-10-CM | POA: Insufficient documentation

## 2014-12-20 DIAGNOSIS — Z7982 Long term (current) use of aspirin: Secondary | ICD-10-CM | POA: Diagnosis not present

## 2014-12-20 DIAGNOSIS — Z794 Long term (current) use of insulin: Secondary | ICD-10-CM | POA: Diagnosis not present

## 2014-12-20 DIAGNOSIS — D649 Anemia, unspecified: Secondary | ICD-10-CM | POA: Insufficient documentation

## 2014-12-20 LAB — BRAIN NATRIURETIC PEPTIDE: B Natriuretic Peptide: 235.5 pg/mL — ABNORMAL HIGH (ref 0.0–100.0)

## 2014-12-20 LAB — CBC WITH DIFFERENTIAL/PLATELET
BASOS ABS: 0 10*3/uL (ref 0.0–0.1)
BASOS PCT: 0 % (ref 0–1)
Eosinophils Absolute: 0.1 10*3/uL (ref 0.0–0.7)
Eosinophils Relative: 2 % (ref 0–5)
HEMATOCRIT: 42.8 % (ref 36.0–46.0)
HEMOGLOBIN: 13.8 g/dL (ref 12.0–15.0)
LYMPHS ABS: 2.4 10*3/uL (ref 0.7–4.0)
LYMPHS PCT: 37 % (ref 12–46)
MCH: 29.6 pg (ref 26.0–34.0)
MCHC: 32.2 g/dL (ref 30.0–36.0)
MCV: 91.6 fL (ref 78.0–100.0)
MONOS PCT: 6 % (ref 3–12)
Monocytes Absolute: 0.4 10*3/uL (ref 0.1–1.0)
NEUTROS PCT: 55 % (ref 43–77)
Neutro Abs: 3.6 10*3/uL (ref 1.7–7.7)
PLATELETS: 239 10*3/uL (ref 150–400)
RBC: 4.67 MIL/uL (ref 3.87–5.11)
RDW: 14 % (ref 11.5–15.5)
WBC: 6.4 10*3/uL (ref 4.0–10.5)

## 2014-12-20 LAB — BASIC METABOLIC PANEL
ANION GAP: 11 (ref 5–15)
BUN: 10 mg/dL (ref 6–23)
CHLORIDE: 95 mmol/L — AB (ref 96–112)
CO2: 30 mmol/L (ref 19–32)
CREATININE: 1.06 mg/dL (ref 0.50–1.10)
Calcium: 8.5 mg/dL (ref 8.4–10.5)
GFR calc Af Amer: 71 mL/min — ABNORMAL LOW (ref >90)
GFR calc non Af Amer: 61 mL/min — ABNORMAL LOW (ref >90)
Glucose, Bld: 179 mg/dL — ABNORMAL HIGH (ref 70–99)
Potassium: 3.7 mmol/L (ref 3.5–5.1)
SODIUM: 136 mmol/L (ref 135–145)

## 2014-12-20 LAB — TROPONIN I

## 2014-12-20 MED ORDER — FUROSEMIDE 10 MG/ML IJ SOLN
80.0000 mg | Freq: Once | INTRAMUSCULAR | Status: AC
  Administered 2014-12-20: 80 mg via INTRAVENOUS
  Filled 2014-12-20: qty 8

## 2014-12-20 NOTE — ED Provider Notes (Signed)
CSN: [id number]     Arrival date & time 12/20/14  2156 History   This chart was scribed for Barbara Booze, MD by Murriel Hopper, ED Scribe. This patient was seen in room D31C/D31C and the patient's care was started at 11:27 PM.    Chief Complaint  Patient presents with  . Shortness of Breath    HPI Comments: [name] is a 49 y.o. female with CHF who presents to the Emergency Department complaining of intermittent SOB with associated productive cough and mild chest pain that has been present for three days. Pt states that walking and laying down make her SOB worse. Pt states that she has been retaining fluid because of her CHF and states that he legs have been swelling. Pt states she has gained around 10 pounds since symptoms began.     The history is provided by the patient. No language interpreter was used.    Past Medical History  Diagnosis Date  . CHF (congestive heart failure)     a. EF 30-35%, RV mildly dilated (difficult study 11/2013) b. RHC (12/2013): RA 38/37 (35), RV 90/28, PA 95/51 (71), PCWP 54, PA 40%, AO 96 %, CO/CI Fick 3.2/1.4, PVR 5.3   . Nonischemic cardiomyopathy 12/2013    a. LHC (12/2013) normal coronary anatomy   . Mitral regurgitation     moderate to severe  . S/P cardiac catheterization   . LBBB (left bundle branch block)     s/p [employer] CRT-D  . HTN (hypertension)   . Asthma   . IBS (irritable bowel syndrome)     with primarily constipation  . Morbid obesity   . GERD (gastroesophageal reflux disease)   . IDA (iron deficiency anemia)     2o TO SB AVMS, Hx parenteral iron Dr Mariel Sleet  . AVM (arteriovenous malformation)     Small bowel, s/p duble balloon enteroscopy/APC jejunum Dr Gwinda Passe [facility]) 01/23/2011  . Anemia, chronic disease   . Peripheral neuropathy   . OSA (obstructive sleep apnea)     on CPAP qhs  . GI bleed 06/2009    Hgb 7.5, ferritin 7, transfusion, iron IV  . Dyspnea on exertion     chronic  . Diabetes mellitus without  complication   . ICD (implantable cardioverter-defibrillator), single, in situ 07/28/2014 interrogation    Pt shocked 3x since 07/20/14---pt intentionally unplugged Latitude, remote received 07/27/14 once plugged in. Pt unaware of shock on 07/27/14. Thresholds and sensing consistent with previous device measurements. Lead impedance trends stable over time.    Past Surgical History  Procedure Laterality Date  . Crdt-implantation  4/07    [employer]. remote- yes  . Appendectomy    . Cholecystectomy      biliary dyskinesia  . Mastectomy  2003    left, partial  . Knee arthroscopy  2005    left  . Small bowel enteroscopy  MAY 2012 [facility] DR. GILLIAM    SB AVMS s/p APC  . Colonoscopy  OCT 2010/SEP 2011    tortuous colon, 3 polyps-benign(2010),   . Upper gastrointestinal endoscopy  '08, '10, SEP 2011    mild antral gastritis (2010), negative SB bx (2010), incomlpete Schatzki's ring (2011)  . Small bowel enteroscopy  SEP 2011 [facility]    Fields-NO AVMS  . Givens capsule study  NOV 2010     NO AVMS, normal  . Irrigation and debridement abscess  06/25/2012    Procedure: MINOR INCISION AND DRAINAGE OF ABSCESS;  Surgeon: Chrissie Noa  Daisy Blossom, MD;  Location: [facility] ORS;  Service: General;  Laterality: [name];  Incision & Drainage of Infected Sebaceous Cyst on Chest  . Breast surgery    . Cardiac defibrillator placement      [employer]  . Implantable cardioverter defibrillator generator change Left 02/15/2012    Procedure: IMPLANTABLE CARDIOVERTER DEFIBRILLATOR GENERATOR CHANGE;  Surgeon: Marinus Maw, MD;  Location[facility] C[facility] LAB;  Service: Cardiovascular;  Laterality: Left;  . Left and right heart catheterization with coronary/graft angiogram  12/12/2013    Procedure: LEFT AND RIGHT HEART CATHETERIZATION WITH Isabel Caprice;  Surgeon: Peter M Swaziland, MD;  Location: [facility] CATH LAB;  Service: Cardiovascular;;   Family History  Problem Relation Age of Onset  . Colon cancer Neg Hx      no family Hx of polyps too, uncle  . Cervical cancer Mother   . Hypertension Mother   . Cancer Mother   . Heart disease Father   . Hypertension Father   . GER disease Father   . Heart attack Father   . Bleeding Disorder Father   . Diabetes Sister   . Heart disease Sister   . Hypertension Sister    History  Substance Use Topics  . Smoking status: Former Smoker -- 0.50 packs/day for 20 years    Types: Cigarettes    Quit date: 11/19/2009  . Smokeless tobacco: Former Neurosurgeon  . Alcohol Use: No   OB History    Gravida Para Term Preterm AB TAB SAB Ectopic Multiple Living   6 1 1  5  3 2        Review of Systems  Respiratory: Positive for cough and shortness of breath.   Cardiovascular: Positive for chest pain and leg swelling.  All other systems reviewed and are negative.     Allergies  Cymbalta; Trazodone and nefazodone; and Lyrica  Home Medications   Prior to Admission medications   Medication Sig Start Date End Date Taking? Authorizing Provider  albuterol (PROVENTIL HFA;VENTOLIN HFA) 108 (90 BASE) MCG/ACT inhaler Inhale 2 puffs into the lungs every 6 (six) hours as needed for wheezing or shortness of breath.    Historical Provider, MD  albuterol (PROVENTIL) (5 MG/ML) 0.5% nebulizer solution Take 0.5 mLs (2.5 mg total) by nebulization every 4 (four) hours as needed for wheezing or shortness of breath. 11/21/12   Gareth Morgan, MD  allopurinol (ZYLOPRIM) 300 MG tablet Take 1 tablet (300 mg total) by mouth daily. 08/14/14   [name] D Filbert Schilder, NP  amiodarone (PACERONE) 200 MG tablet Take 1 tablet (200 mg total) by mouth 2 (two) times daily. 11/23/14   Laurey Morale, MD  aspirin EC 81 MG EC tablet Take 1 tablet (81 mg total) by mouth daily. 12/16/13   Aundria Rud, NP  carvedilol (COREG) 6.25 MG tablet Take 1 tablet (6.25 mg total) by mouth 2 (two) times daily with a meal. 10/19/14   Marinus Maw, MD  cetirizine (ZYRTEC) 10 MG tablet Take 10 mg by mouth daily as needed for allergies.      Historical Provider, MD  cyclobenzaprine (FLEXERIL) 10 MG tablet Take 10 mg by mouth 3 (three) times daily as needed for muscle spasms.     Historical Provider, MD  digoxin (LANOXIN) 0.125 MG tablet Take 1 tablet (0.125 mg total) by mouth daily. 05/05/14   Aundria Rud, NP  ferrous sulfate 325 (65 FE) MG tablet Take 325 mg by mouth 3 (three) times daily.    Historical Provider,  MD  gabapentin (NEURONTIN) 300 MG capsule Take 600 mg by mouth 4 (four) times daily.    Historical Provider, MD  HYDROcodone-acetaminophen (NORCO) 10-325 MG per tablet Take 1 tablet by mouth every 4 (four) hours as needed for moderate pain.  11/11/13   Historical Provider, MD  insulin aspart (NOVOLOG) 100 UNIT/ML injection Inject 0-20 Units into the skin 3 (three) times daily with meals. 06/28/12   Gareth Morgan, MD  insulin detemir (LEVEMIR) 100 UNIT/ML injection Inject 100-110 Units into the skin at bedtime.     Historical Provider, MD  ivabradine (CORLANOR) 5 MG TABS tablet Take 0.5 tablets (2.5 mg total) by mouth 2 (two) times daily with a meal. 08/21/14   Dolores Patty, MD  losartan (COZAAR) 50 MG tablet Take 1 tablet a.m and 1/2 tablet p.m 11/23/14   Laurey Morale, MD  omeprazole (PRILOSEC) 40 MG capsule Take 1 capsule (40 mg total) by mouth daily. 03/27/13   Marinus Maw, MD  potassium chloride SA (K-DUR,KLOR-CON) 20 MEQ tablet Take 1 tablet (20 mEq total) by mouth 2 (two) times daily. Or as instructed when to take extra. 05/05/14   Aundria Rud, NP  spironolactone (ALDACTONE) 25 MG tablet Take 1 tablet (25 mg total) by mouth daily. 09/03/14   Bevelyn Buckles Bensimhon, MD  temazepam (RESTORIL) 30 MG capsule Take 30 mg by mouth at bedtime.    Historical Provider, MD  torsemide (DEMADEX) 20 MG tablet Take 4 tablets (80 mg total) by mouth 2 (two) times daily. 11/23/14   Laurey Morale, MD  vitamin E 400 UNIT capsule Take 400 Units by mouth daily.    Historical Provider, MD   BP 148/116 mmHg  Pulse 75  Temp(Src) 98.6  F (37 C)  Resp 20  SpO2 97%  LMP 06/21/2012 Physical Exam  Constitutional: She is oriented to person, place, and time. She appears well-developed and well-nourished.  HENT:  Head: Normocephalic and atraumatic.  Eyes: EOM are normal. Pupils are equal, round, and reactive to light.  Neck: Normal range of motion. Neck supple.  Cardiovascular: Normal rate, regular rhythm and normal heart sounds.   No murmur heard. Pulmonary/Chest: Effort normal. She has no wheezes. She exhibits no tenderness.  Faint rales base of left lung  Abdominal: Soft. Bowel sounds are normal. She exhibits no distension and no mass. There is no tenderness.  Musculoskeletal: Normal range of motion. She exhibits edema.  2+ pitting edema bilaterally   Lymphadenopathy:    She has no cervical adenopathy.  Neurological: She is alert and oriented to person, place, and time. No cranial nerve deficit. She exhibits normal muscle tone. Coordination normal.  Skin: Skin is warm and dry. No rash noted.  Psychiatric: She has a normal mood and affect. Her behavior is normal. Judgment and thought content normal.  Nursing note and vitals reviewed.   ED Course  Procedures (including critical care time)  DIAGNOSTIC STUDIES: Oxygen Saturation is 97% on room air, normal by my interpretation.    COORDINATION OF CARE: 11:32 PM Discussed treatment plan with pt at bedside and pt agreed to plan.  1:07 AM Pt has urinated once and reports improved symptoms.    Labs Review Results for orders placed or performed during the hospital encounter of 12/20/14  Basic metabolic panel  Result Value Ref Range   Sodium 136 135 - 145 mmol/L   Potassium 3.7 3.5 - 5.1 mmol/L   Chloride 95 (L) 96 - 112 mmol/L   CO2 30 19 -  32 mmol/L   Glucose, Bld 179 (H) 70 - 99 mg/dL   BUN 10 6 - 23 mg/dL   Creatinine, Ser 1.22 0.50 - 1.10 mg/dL   Calcium 8.5 8.4 - 48.2 mg/dL   GFR calc non Af Amer 61 (L) >90 mL/min   GFR calc Af Amer 71 (L) >90 mL/min    Anion gap 11 5 - 15  BNP (order ONLY if patient complains of dyspnea/SOB AND you have documented it for THIS visit)  Result Value Ref Range   B Natriuretic Peptide 235.5 (H) 0.0 - 100.0 pg/mL  CBC with Differential  Result Value Ref Range   WBC 6.4 4.0 - 10.5 K/uL   RBC 4.67 3.87 - 5.11 MIL/uL   Hemoglobin 13.8 12.0 - 15.0 g/dL   HCT 50.0 37.0 - 48.8 %   MCV 91.6 78.0 - 100.0 fL   MCH 29.6 26.0 - 34.0 pg   MCHC 32.2 30.0 - 36.0 g/dL   RDW 89.1 69.4 - 50.3 %   Platelets 239 150 - 400 K/uL   Neutrophils Relative % 55 43 - 77 %   Neutro Abs 3.6 1.7 - 7.7 K/uL   Lymphocytes Relative 37 12 - 46 %   Lymphs Abs 2.4 0.7 - 4.0 K/uL   Monocytes Relative 6 3 - 12 %   Monocytes Absolute 0.4 0.1 - 1.0 K/uL   Eosinophils Relative 2 0 - 5 %   Eosinophils Absolute 0.1 0.0 - 0.7 K/uL   Basophils Relative 0 0 - 1 %   Basophils Absolute 0.0 0.0 - 0.1 K/uL  Troponin I  Result Value Ref Range   Troponin I <0.03 <0.031 ng/mL   Imaging Review Dg Chest 2 View  12/21/2014   CLINICAL DATA:  Shortness of breath, chest discomfort for tonight.  EXAM: CHEST  2 VIEW  COMPARISON:  08/06/2014  FINDINGS: Multi lead left-sided pacemaker remains in place. Cardiomediastinal contours and cardiomegaly are unchanged. Limited assessment of the left lung base due to soft tissue attenuation. Mild vascular congestion. No pulmonary edema, confluent airspace disease, pleural effusion or pneumothorax. No acute osseous abnormalities are seen.  IMPRESSION: Stable cardiomegaly.  Mild vascular congestion.   Electronically Signed   By: Rubye Oaks M.D.   On: 12/21/2014 00:19     EKG Interpretation   Date/Time:  Sunday December 20 2014 22:03:50 EDT Ventricular Rate:  80 PR Interval:  86 QRS Duration: 176 QT Interval:  514 QTC Calculation: 592 R Axis:   177 Text Interpretation:  Atrial-sensed ventricular-paced rhythm Biventricular  pacemaker detected Abnormal ECG When compared with ECG of 08/05/2014, No  significant  change was found Confirmed by [facility]  MD, Kelsha Older ([id number]) on  12/20/2014 11:26:08 PM      MDM   Final diagnoses:  CHF exacerbation    CHF exacerbation manifested by peripheral edema and dyspnea. Review of past records shows prior clinic visits and ED visits for heart failure as well as hospitalizations for heart failure. Recent echocardiogram showed ejection fraction 30-35 percent. BNP is only mildly elevated, and chest x-ray only shows some mild vascular congestion. However, she clearly has CHF on physical exam. She is given a dose of furosemide and did urinate 500 mL, but has not urinated following that. She was ambulated in the department and did not show any oxygen desaturation. She is given a second injection of furosemide and is referred to the heart failure clinic for follow-up.  I personally performed the services described in this documentation, which was  scribed in my presence. The recorded information has been reviewed and is accurate.     Barbara Booze, MD 12/21/14 [phone]

## 2014-12-20 NOTE — ED Notes (Signed)
The pt is c./o being sob with a productive cough white thick no temp   She has a defrib

## 2014-12-21 ENCOUNTER — Telehealth (HOSPITAL_COMMUNITY): Payer: Self-pay | Admitting: Vascular Surgery

## 2014-12-21 DIAGNOSIS — I509 Heart failure, unspecified: Secondary | ICD-10-CM | POA: Diagnosis not present

## 2014-12-21 MED ORDER — METOCLOPRAMIDE HCL 5 MG/ML IJ SOLN
10.0000 mg | Freq: Once | INTRAMUSCULAR | Status: AC
  Administered 2014-12-21: 10 mg via INTRAVENOUS
  Filled 2014-12-21: qty 2

## 2014-12-21 MED ORDER — FUROSEMIDE 10 MG/ML IJ SOLN
80.0000 mg | Freq: Once | INTRAMUSCULAR | Status: AC
  Administered 2014-12-21: 80 mg via INTRAVENOUS
  Filled 2014-12-21: qty 8

## 2014-12-21 MED ORDER — DIPHENHYDRAMINE HCL 50 MG/ML IJ SOLN
25.0000 mg | Freq: Once | INTRAMUSCULAR | Status: AC
  Administered 2014-12-21: 25 mg via INTRAVENOUS
  Filled 2014-12-21: qty 1

## 2014-12-21 MED ORDER — ONDANSETRON HCL 4 MG/2ML IJ SOLN
4.0000 mg | Freq: Once | INTRAMUSCULAR | Status: AC
  Administered 2014-12-21: 4 mg via INTRAVENOUS
  Filled 2014-12-21: qty 2

## 2014-12-21 NOTE — Telephone Encounter (Signed)
Spoke w/pt's husband, he states pt just went to sleep.  He states that he took her to ER late yesterday and they gave her some IV Lasix and sent her home this AM, he states since being at home pt is feeling a little better and has just went to sleep.  Pt is sch to see [place] on Thur AM and he will make sure pt keeps that appt, advised if feels worse before then to call us back

## 2014-12-21 NOTE — Telephone Encounter (Signed)
Pt left message with answering service... SHORT OF BREATH, HACKING COUGH, FEELING LIKE      SHE IS GOING TO PASS OUT, PLS C/B .Marland Kitchen Please advise

## 2014-12-21 NOTE — ED Notes (Signed)
Pt ambulated in hallway with pulse ox, pt o2 between 91-95, heart rate 78.

## 2014-12-21 NOTE — Discharge Instructions (Signed)
Call the heart failure clinic in the morning so they can see you today or tomorrow.   Heart Failure Heart failure is a condition in which the heart has trouble pumping blood. This means your heart does not pump blood efficiently for your body to work well. In some cases of heart failure, fluid may back up into your lungs or you may have swelling (edema) in your lower legs. Heart failure is usually a long-term (chronic) condition. It is important for you to take good care of yourself and follow your health care provider's treatment plan. CAUSES  Some health conditions can cause heart failure. Those health conditions include:  High blood pressure (hypertension). Hypertension causes the heart muscle to work harder than normal. When pressure in the blood vessels is high, the heart needs to pump (contract) with more force in order to circulate blood throughout the body. High blood pressure eventually causes the heart to become stiff and weak.  Coronary artery disease (CAD). CAD is the buildup of cholesterol and fat (plaque) in the arteries of the heart. The blockage in the arteries deprives the heart muscle of oxygen and blood. This can cause chest pain and may lead to a heart attack. High blood pressure can also contribute to CAD.  Heart attack (myocardial infarction). A heart attack occurs when one or more arteries in the heart become blocked. The loss of oxygen damages the muscle tissue of the heart. When this happens, part of the heart muscle dies. The injured tissue does not contract as well and weakens the heart's ability to pump blood.  Abnormal heart valves. When the heart valves do not open and close properly, it can cause heart failure. This makes the heart muscle pump harder to keep the blood flowing.  Heart muscle disease (cardiomyopathy or myocarditis). Heart muscle disease is damage to the heart muscle from a variety of causes. These can include drug or alcohol abuse, infections, or unknown  reasons. These can increase the risk of heart failure.  Lung disease. Lung disease makes the heart work harder because the lungs do not work properly. This can cause a strain on the heart, leading it to fail.  Diabetes. Diabetes increases the risk of heart failure. High blood sugar contributes to high fat (lipid) levels in the blood. Diabetes can also cause slow damage to tiny blood vessels that carry important nutrients to the heart muscle. When the heart does not get enough oxygen and food, it can cause the heart to become weak and stiff. This leads to a heart that does not contract efficiently.  Other conditions can contribute to heart failure. These include abnormal heart rhythms, thyroid problems, and low blood counts (anemia). Certain unhealthy behaviors can increase the risk of heart failure, including:  Being overweight.  Smoking or chewing tobacco.  Eating foods high in fat and cholesterol.  Abusing illicit drugs or alcohol.  Lacking physical activity. SYMPTOMS  Heart failure symptoms may vary and can be hard to detect. Symptoms may include:  Shortness of breath with activity, such as climbing stairs.  Persistent cough.  Swelling of the feet, ankles, legs, or abdomen.  Unexplained weight gain.  Difficulty breathing when lying flat (orthopnea).  Waking from sleep because of the need to sit up and get more air.  Rapid heartbeat.  Fatigue and loss of energy.  Feeling light-headed, dizzy, or close to fainting.  Loss of appetite.  Nausea.  Increased urination during the night (nocturia). DIAGNOSIS  A diagnosis of heart failure is  based on your history, symptoms, physical examination, and diagnostic tests. Diagnostic tests for heart failure may include:  Echocardiography.  Electrocardiography.  Chest X-ray.  Blood tests.  Exercise stress test.  Cardiac angiography.  Radionuclide scans. TREATMENT  Treatment is aimed at managing the symptoms of heart  failure. Medicines, behavioral changes, or surgical intervention may be necessary to treat heart failure.  Medicines to help treat heart failure may include:  Angiotensin-converting enzyme (ACE) inhibitors. This type of medicine blocks the effects of a blood protein called angiotensin-converting enzyme. ACE inhibitors relax (dilate) the blood vessels and help lower blood pressure.  Angiotensin receptor blockers (ARBs). This type of medicine blocks the actions of a blood protein called angiotensin. Angiotensin receptor blockers dilate the blood vessels and help lower blood pressure.  Water pills (diuretics). Diuretics cause the kidneys to remove salt and water from the blood. The extra fluid is removed through urination. This loss of extra fluid lowers the volume of blood the heart pumps.  Beta blockers. These prevent the heart from beating too fast and improve heart muscle strength.  Digitalis. This increases the force of the heartbeat.  Healthy behavior changes include:  Obtaining and maintaining a healthy weight.  Stopping smoking or chewing tobacco.  Eating heart-healthy foods.  Limiting or avoiding alcohol.  Stopping illicit drug use.  Physical activity as directed by your health care provider.  Surgical treatment for heart failure may include:  A procedure to open blocked arteries, repair damaged heart valves, or remove damaged heart muscle tissue.  A pacemaker to improve heart muscle function and control certain abnormal heart rhythms.  An internal cardioverter defibrillator to treat certain serious abnormal heart rhythms.  A left ventricular assist device (LVAD) to assist the pumping ability of the heart. HOME CARE INSTRUCTIONS   Take medicines only as directed by your health care provider. Medicines are important in reducing the workload of your heart, slowing the progression of heart failure, and improving your symptoms.  Do not stop taking your medicine unless  directed by your health care provider.  Do not skip any dose of medicine.  Refill your prescriptions before you run out of medicine. Your medicines are needed every day.  Engage in moderate physical activity if directed by your health care provider. Moderate physical activity can benefit some people. The elderly and people with severe heart failure should consult with a health care provider for physical activity recommendations.  Eat heart-healthy foods. Food choices should be free of trans fat and low in saturated fat, cholesterol, and salt (sodium). Healthy choices include fresh or frozen fruits and vegetables, fish, lean meats, legumes, fat-free or low-fat dairy products, and whole grain or high fiber foods. Talk to a dietitian to learn more about heart-healthy foods.  Limit sodium if directed by your health care provider. Sodium restriction may reduce symptoms of heart failure in some people. Talk to a dietitian to learn more about heart-healthy seasonings.  Use healthy cooking methods. Healthy cooking methods include roasting, grilling, broiling, baking, poaching, steaming, or stir-frying. Talk to a dietitian to learn more about healthy cooking methods.  Limit fluids if directed by your health care provider. Fluid restriction may reduce symptoms of heart failure in some people.  Weigh yourself every day. Daily weights are important in the early recognition of excess fluid. You should weigh yourself every morning after you urinate and before you eat breakfast. Wear the same amount of clothing each time you weigh yourself. Record your daily weight. Provide your health care  provider with your weight record.  Monitor and record your blood pressure if directed by your health care provider.  Check your pulse if directed by your health care provider.  Lose weight if directed by your health care provider. Weight loss may reduce symptoms of heart failure in some people.  Stop smoking or chewing  tobacco. Nicotine makes your heart work harder by causing your blood vessels to constrict. Do not use nicotine gum or patches before talking to your health care provider.  Keep all follow-up visits as directed by your health care provider. This is important.  Limit alcohol intake to no more than 1 drink per day for nonpregnant women and 2 drinks per day for men. One drink equals 12 ounces of beer, 5 ounces of wine, or 1 ounces of hard liquor. Drinking more than that is harmful to your heart. Tell your health care provider if you drink alcohol several times a week. Talk with your health care provider about whether alcohol is safe for you. If your heart has already been damaged by alcohol or you have severe heart failure, drinking alcohol should be stopped completely.  Stop illicit drug use.  Stay up-to-date with immunizations. It is especially important to prevent respiratory infections through current pneumococcal and influenza immunizations.  Manage other health conditions such as hypertension, diabetes, thyroid disease, or abnormal heart rhythms as directed by your health care provider.  Learn to manage stress.  Plan rest periods when fatigued.  Learn strategies to manage high temperatures. If the weather is extremely hot:  Avoid vigorous physical activity.  Use air conditioning or fans or seek a cooler location.  Avoid caffeine and alcohol.  Wear loose-fitting, lightweight, and light-colored clothing.  Learn strategies to manage cold temperatures. If the weather is extremely cold:  Avoid vigorous physical activity.  Layer clothes.  Wear mittens or gloves, a hat, and a scarf when going outside.  Avoid alcohol.  Obtain ongoing education and support as needed.  Participate in or seek rehabilitation as needed to maintain or improve independence and quality of life. SEEK MEDICAL CARE IF:   Your weight increases by 03 lb/1.4 kg in 1 day or 05 lb/2.3 kg in a week.  You have  increasing shortness of breath that is unusual for you.  You are unable to participate in your usual physical activities.  You tire easily.  You cough more than normal, especially with physical activity.  You have any or more swelling in areas such as your hands, feet, ankles, or abdomen.  You are unable to sleep because it is hard to breathe.  You feel like your heart is beating fast (palpitations).  You become dizzy or light-headed upon standing up. SEEK IMMEDIATE MEDICAL CARE IF:   You have difficulty breathing.  There is a change in mental status such as decreased alertness or difficulty with concentration.  You have a pain or discomfort in your chest.  You have an episode of fainting (syncope). MAKE SURE YOU:   Understand these instructions.  Will watch your condition.  Will get help right away if you are not doing well or get worse. Document Released: 08/28/2005 Document Revised: 01/12/2014 Document Reviewed: 09/27/2012 [facility] Patient Information 2015 [place], [place]. This information is not intended to replace advice given to you by your health care provider. Make sure you discuss any questions you have with your health care provider.

## 2014-12-24 ENCOUNTER — Other Ambulatory Visit: Payer: Self-pay | Admitting: Cardiology

## 2014-12-24 ENCOUNTER — Encounter (HOSPITAL_COMMUNITY): Payer: Self-pay

## 2014-12-24 ENCOUNTER — Ambulatory Visit (HOSPITAL_COMMUNITY)
Admission: RE | Admit: 2014-12-24 | Discharge: 2014-12-24 | Disposition: A | Discharge status: Home / Self Care | Payer: Medicare Other | Source: Ambulatory Visit | Attending: Internal Medicine | Admitting: Internal Medicine

## 2014-12-24 VITALS — BP 130/82 | HR 70 | Wt 278.2 lb

## 2014-12-24 DIAGNOSIS — K219 Gastro-esophageal reflux disease without esophagitis: Secondary | ICD-10-CM | POA: Insufficient documentation

## 2014-12-24 DIAGNOSIS — Z794 Long term (current) use of insulin: Secondary | ICD-10-CM | POA: Insufficient documentation

## 2014-12-24 DIAGNOSIS — I5022 Chronic systolic (congestive) heart failure: Secondary | ICD-10-CM | POA: Insufficient documentation

## 2014-12-24 DIAGNOSIS — E119 Type 2 diabetes mellitus without complications: Secondary | ICD-10-CM | POA: Insufficient documentation

## 2014-12-24 DIAGNOSIS — I5020 Unspecified systolic (congestive) heart failure: Secondary | ICD-10-CM | POA: Diagnosis not present

## 2014-12-24 DIAGNOSIS — Z7982 Long term (current) use of aspirin: Secondary | ICD-10-CM | POA: Diagnosis not present

## 2014-12-24 DIAGNOSIS — Z79899 Other long term (current) drug therapy: Secondary | ICD-10-CM | POA: Insufficient documentation

## 2014-12-24 DIAGNOSIS — I10 Essential (primary) hypertension: Secondary | ICD-10-CM | POA: Insufficient documentation

## 2014-12-24 DIAGNOSIS — R531 Weakness: Secondary | ICD-10-CM | POA: Diagnosis not present

## 2014-12-24 DIAGNOSIS — I4729 Other ventricular tachycardia: Secondary | ICD-10-CM

## 2014-12-24 DIAGNOSIS — I429 Cardiomyopathy, unspecified: Secondary | ICD-10-CM | POA: Insufficient documentation

## 2014-12-24 DIAGNOSIS — I472 Ventricular tachycardia: Secondary | ICD-10-CM | POA: Diagnosis not present

## 2014-12-24 DIAGNOSIS — M7989 Other specified soft tissue disorders: Secondary | ICD-10-CM | POA: Diagnosis not present

## 2014-12-24 DIAGNOSIS — G4733 Obstructive sleep apnea (adult) (pediatric): Secondary | ICD-10-CM | POA: Insufficient documentation

## 2014-12-24 MED ORDER — ISOSORB DINITRATE-HYDRALAZINE 20-37.5 MG PO TABS: 0.5000 | ORAL_TABLET | Freq: Three times a day (TID) | ORAL | Status: DC

## 2014-12-24 MED ORDER — POTASSIUM CHLORIDE CRYS ER 20 MEQ PO TBCR
40.0000 meq | EXTENDED_RELEASE_TABLET | Freq: Once | ORAL | Status: AC
  Administered 2014-12-24: 40 meq via ORAL

## 2014-12-24 MED ORDER — FUROSEMIDE 10 MG/ML IJ SOLN
120.0000 mg | Freq: Once | INTRAVENOUS | Status: AC
  Administered 2014-12-24: 120 mg via INTRAVENOUS

## 2014-12-24 MED ORDER — POTASSIUM CHLORIDE CRYS ER 20 MEQ PO TBCR: 20.0000 meq | EXTENDED_RELEASE_TABLET | Freq: Two times a day (BID) | ORAL | Status: DC

## 2014-12-24 MED ORDER — METOLAZONE 2.5 MG PO TABS: ORAL_TABLET | ORAL | Status: DC

## 2014-12-24 NOTE — Progress Notes (Signed)
Patient ID: [name], female   DOB: [birth year], 49 y.o.   MRN: [id number] EP: Dr [name] PCP: Dr [name]  HPI: [name] is a 49 yo female with a history of NICM s/p BiV ICD 2007 [facility]), chronic systolic HF, DM2, HTN, recurrent VT, morbid obesity and OSA on CPAP.   Admitted to [facility] 12/08/13 with progressive DOE.and volume overload.  She was transferred from [facility] to [facility] due to acute decompensated HF. ECHO EF 35% by echo but difficult study.  Admitted 08/05/14 after ICD shock for V fib. Also diuresed with IV lasix and transitioned back to torsemide. Also with thyromegaly and had ultrasound with small cysts noted. Discharge weight was 249 pounds.  She had another ICD shock in 12/15 for VT, now on amiodarone 200 mg bid.   She is unable to tolerate Corlanor 5 mg bid but does ok on 2.5 mg bid.   Echo (3/16) with EF 30-35%, severe LV dilation with mild LVH, restrictive diastolic function, moderate MR, mildly decreased RV systolic function, PA systolic pressure 60 mmHg.   At last appointment, she was volume overloaded.  I increased her torsemide to 80 mg bid, but this does not seem to have helped much.  She still is short of breath with mild exertion such as walking 50 feet.  She is short of breath around the house.  She has prominent orthopnea.  No chest pain.  She went to the ER 4/10 and was given 2 doses of 40 mg IV Lasix without much effect.   [facility] 12/12/13  RA 38/37 (35)  RV 90/28  PA 95/51 (71)  PCWP 57/73 (54)  LV 127/50  AO 123/66 (100)  Oxygen saturations:  PA 40%  AO 96%  Cardiac Output/Index (Fick) 3.2/1.4, PVR 5.3  Normal coronary anatomy  CPX 6/15 FVC 1.34 (42%)  FEV1 1.11 (43%)  FEV1/FVC 83%  MVV 42 (40%) RPE: 19 Resting HR: 80 Peak HR: 115 (67% age predicted max HR) BP rest: 102/70 BP peak: 118/68 Peak VO2: 9.3 (52.9% predicted peak VO2) VE/VCO2 slope: 29.3 OUES: 1.57 Peak RER: 0.95 Ventilatory Threshold: 7.7 (43.8% predicted peak VO2) Peak RR 44 Peak  Ventilation: 32 VE/MVV: 76.2% PETCO2 at peak: 41 O2pulse: 10 (77% predicted O2pulse)  - Echo 12/09/13 EF 35% echo but difficult study.  - Echo (3/16) with EF 30-35%, severe LV dilation with mild LVH, restrictive diastolic function, moderate MR, mildly decreased RV systolic function, PA systolic pressure 60 mmHg.   Labs 11/28/13 Pro BNP 948  Labs 12/23/13 Dig level 0.6, K 4.6,  Creatinine 0.86 , Pro BNP 248 Labs 02/23/14 K 3.2 Cr 0.69 Labs 8/15 K 3.7, creatinine 0.78 Labs 12/15 K 4.5, creatinine 0.98, BNP 172, TSH normal Labs 3/16 TSH normal, LFTs normal, digoxin 0.9 Labs 4/16 K 3.7, creatinine 1.06, BNP 235, HCT 42.8  FH: Father alive with HF   SH: No ETOH and does not smoke, lives in [place] with husband and 83 yr old daughter   ROS: All systems negative except as listed in HPI, PMH and Problem List.  Past Medical History  Diagnosis Date  . CHF (congestive heart failure)     a. EF 30-35%, RV mildly dilated (difficult study 11/2013) b. RHC (12/2013): RA 38/37 (35), RV 90/28, PA 95/51 (71), PCWP 54, PA 40%, AO 96 %, CO/CI Fick 3.2/1.4, PVR 5.3   . Nonischemic cardiomyopathy 12/2013    a. LHC (12/2013) normal coronary anatomy   . Mitral regurgitation     moderate to  severe  . S/P cardiac catheterization   . LBBB (left bundle branch block)     s/p [employer] CRT-D  . HTN (hypertension)   . Asthma   . IBS (irritable bowel syndrome)     with primarily constipation  . Morbid obesity   . GERD (gastroesophageal reflux disease)   . IDA (iron deficiency anemia)     2o TO SB AVMS, Hx parenteral iron Dr Mariel Sleet  . AVM (arteriovenous malformation)     Small bowel, s/p duble balloon enteroscopy/APC jejunum Dr G[facility]) 01/23/2011  . Anemia, chronic disease   . Peripheral neuropathy   . OSA (obstructive sleep apnea)     on CPAP qhs  . GI bleed 06/2009    Hgb 7.5, ferritin 7, transfusion, iron IV  . Dyspnea on exertion     chronic  . Diabetes mellitus without complication    . ICD (implantable cardioverter-defibrillator), single, in situ 07/28/2014 interrogation    Pt shocked 3x since 07/20/14---pt intentionally unplugged Latitude, remote received 07/27/14 once plugged in. Pt unaware of shock on 07/27/14. Thresholds and sensing consistent with previous device measurements. Lead impedance trends stable over time.     Current Outpatient Prescriptions  Medication Sig Dispense Refill  . albuterol (PROVENTIL HFA;VENTOLIN HFA) 108 (90 BASE) MCG/ACT inhaler Inhale 2 puffs into the lungs every 6 (six) hours as needed for wheezing or shortness of breath.    Marland Kitchen albuterol (PROVENTIL) (5 MG/ML) 0.5% nebulizer solution Take 0.5 mLs (2.5 mg total) by nebulization every 4 (four) hours as needed for wheezing or shortness of breath. 20 mL 11  . allopurinol (ZYLOPRIM) 300 MG tablet Take 1 tablet (300 mg total) by mouth daily. 30 tablet 6  . amiodarone (PACERONE) 200 MG tablet Take 1 tablet (200 mg total) by mouth 2 (two) times daily. 60 tablet 3  . aspirin EC 81 MG EC tablet Take 1 tablet (81 mg total) by mouth daily. 30 tablet 3  . carvedilol (COREG) 6.25 MG tablet Take 1 tablet (6.25 mg total) by mouth 2 (two) times daily with a meal. 60 tablet 3  . cetirizine (ZYRTEC) 10 MG tablet Take 10 mg by mouth daily as needed for allergies.     . cyclobenzaprine (FLEXERIL) 10 MG tablet Take 10 mg by mouth 3 (three) times daily as needed for muscle spasms.     . digoxin (LANOXIN) 0.125 MG tablet Take 1 tablet (0.125 mg total) by mouth daily. 90 tablet 3  . ferrous sulfate 325 (65 FE) MG tablet Take 325 mg by mouth 3 (three) times daily.    Marland Kitchen gabapentin (NEURONTIN) 300 MG capsule Take 600 mg by mouth 4 (four) times daily.    Marland Kitchen HYDROcodone-acetaminophen (NORCO) 10-325 MG per tablet Take 1 tablet by mouth every 4 (four) hours as needed for moderate pain.     Marland Kitchen insulin aspart (NOVOLOG) 100 UNIT/ML injection Inject 0-20 Units into the skin 3 (three) times daily with meals. 1 vial 5  . insulin  detemir (LEVEMIR) 100 UNIT/ML injection Inject 100 Units into the skin at bedtime.     . ivabradine (CORLANOR) 5 MG TABS tablet Take 0.5 tablets (2.5 mg total) by mouth 2 (two) times daily with a meal. 60 tablet 2  . losartan (COZAAR) 50 MG tablet Take 1 tablet a.m and 1/2 tablet p.m (Patient taking differently: Take 25-50 mg by mouth 2 (two) times daily. Take 1 tablet a.m and 1/2 tablet p.m) 135 tablet 3  . omeprazole (PRILOSEC) 40 MG capsule  Take 1 capsule (40 mg total) by mouth daily. 30 capsule 6  . potassium chloride SA (K-DUR,KLOR-CON) 20 MEQ tablet Take 1 tablet (20 mEq total) by mouth 2 (two) times daily. Or as instructed when to take extra. 190 tablet 3  . spironolactone (ALDACTONE) 25 MG tablet Take 1 tablet (25 mg total) by mouth daily. 30 tablet 3  . temazepam (RESTORIL) 30 MG capsule Take 30 mg by mouth at bedtime.    . torsemide (DEMADEX) 20 MG tablet Take 4 tablets (80 mg total) by mouth 2 (two) times daily. 180 tablet 3  . vitamin E 400 UNIT capsule Take 400 Units by mouth daily.     Current Facility-Administered Medications  Medication Dose Route Frequency Provider Last Rate Last Dose  . furosemide (LASIX) 120 mg in dextrose 5 % 50 mL IVPB  120 mg Intravenous Once Dolores Patty, MD   120 mg at 12/24/14 1008    Filed Vitals:   12/24/14 0914  BP: 130/82  Pulse: 70  Weight: 278 lb 4 oz (126.213 kg)  SpO2: 95%    PHYSICAL EXAM: General: Obese woman. No resp difficulty. husband present HEENT: normal Neck: Thick. JVP 14+ cm.  Carotids 2+ bilaterally; no bruits. No lymphadenopathy or thryomegaly appreciated. Cor: PMI normal. Regular rate & rhythm. No rubs, 2/6 HSM LLSB.  Unable to palpate pedal pulses.  Lungs: clear Abdomen: obese, soft, nontender, mildly distended. No hepatosplenomegaly. No bruits or masses. Good bowel sounds. Extremities: no cyanosis, clubbing, rash.  R and LLE trace edema.  Neuro: alert & orientedx3, cranial nerves grossly intact. Moves all 4  extremities w/o difficulty. Affect pleasant.  ASSESSMENT & PLAN:  1. Chronic Systolic Heart Failure: Nonischemic cardiomyopathy, s/p [employer] CRT-D. EF 30-35% (11/2014). CPX in 6/15 showed mild cardiac limitation but main issue seemed to be severe ventilatory limitation due to obesity and restrictive physiology.  NYHA class IIIb symptoms.  Today, she appears more volume overloaded and is more symptomatic compared to last appointment.  - Will give Lasix 120 mg IV + 40 KCl in the office today.  - Continue torsemide 80 mg bid but will add metolazone on Mondays and Fridays 2.5 mg 30 minutes prior to am torsemide.  1st dose tomorrow.  She will take and extra 40 KCl on metolazone days.  - Increase losartan to 50 qam, 25 qpm.  - Continue current spironolactone, Coreg, digoxin.  Digoxin level ok 3/16.  - I will add Bidil 1/2 tab tid (or spironolactone 25 every 8 hrs + Imdur 30 daily if insurance will not cover).  - Will need followup in 2 wks with BMET.  - Reinforced the need and importance of daily weights, a low sodium diet, and fluid restriction (less than 2 L a day). Instructed to call the HF clinic if weight increases more than 3 lbs overnight or 5 lbs in a week.  2. OSA: Using CPAP.  3. Morbid Obesity: She is going to consider bariatric surgery eventually.  Needs watch portions.  4. VT: Continue amiodarone 200 mg bid => up from qday with recurrent VT and shocks.  Recent LFTs and TSH were normal. Needs at least yearly eye exam.  5. Leg weakness: Difficult to palpate pedal pulses.  Still waiting on ABIs.    Follow up in 2 weeks.   [name]  12/24/2014

## 2014-12-24 NOTE — Addendum Note (Signed)
Encounter addended by: Chyrl Civatte, RN on: 12/24/2014 11:57 AM<BR>     Documentation filed: Notes Section

## 2014-12-24 NOTE — Progress Notes (Addendum)
Patient in clinic today for routine visit, SOB, weight up 13 lbs from baseline.  Per Dr. [name], PIV 24g started in RLA x 1 attempt.  120mg  IV lasix pushed over 20 minutes, 40 meq potassium also given PO.  Patient tolerated well.  Husband is with patient in clinic room A.  Bedside commode and call bell in reach.  Will continue to monitor closely and check on patient frequently.  Total Urinary Output: 700cc clear yellow nonodorous urine  PIV removed before patient discharged from clinc with dry clean gauze bandage applied.  Ave Filter

## 2014-12-24 NOTE — Addendum Note (Signed)
Encounter addended by: Chyrl Civatte, RN on: 12/24/2014 11:30 AM<BR>     Documentation filed: Visit Diagnoses, Dx Association, Patient Instructions Section, Orders

## 2014-12-24 NOTE — Patient Instructions (Signed)
START Metolazone 2.5 mg tablet. Take one tablet by mouth 30 minutes before taking morning dose of Torsemide on MONDAYS AND FRIDAYS ONLY. You will take an EXTRA (2 tablets) of potassium with Metolazone.  START Bidil 1/2 tablet three times daily.  Will schedule you for lower extremity doppler study at the [facility].  Follow up to our clinic in 2 weeks.  Do the following things EVERYDAY: 1) Weigh yourself in the morning before breakfast. Write it down and keep it in a log. 2) Take your medicines as prescribed 3) Eat low salt foods-Limit salt (sodium) to 2000 mg per day.  4) Stay as active as you can everyday 5) Limit all fluids for the day to less than 2 liters

## 2014-12-28 ENCOUNTER — Inpatient Hospital Stay (HOSPITAL_COMMUNITY)
Admission: EM | Admit: 2014-12-28 | Discharge: 2015-01-01 | DRG: 309 | Disposition: A | Discharge status: Home / Self Care | Payer: Medicare Other | Attending: Internal Medicine | Admitting: Internal Medicine

## 2014-12-28 ENCOUNTER — Encounter (HOSPITAL_COMMUNITY): Payer: Self-pay | Admitting: Emergency Medicine

## 2014-12-28 ENCOUNTER — Emergency Department (HOSPITAL_COMMUNITY): Payer: Medicare Other

## 2014-12-28 ENCOUNTER — Other Ambulatory Visit (HOSPITAL_COMMUNITY): Payer: Self-pay | Admitting: *Deleted

## 2014-12-28 DIAGNOSIS — E860 Dehydration: Secondary | ICD-10-CM | POA: Diagnosis present

## 2014-12-28 DIAGNOSIS — Z87891 Personal history of nicotine dependence: Secondary | ICD-10-CM

## 2014-12-28 DIAGNOSIS — Z7982 Long term (current) use of aspirin: Secondary | ICD-10-CM

## 2014-12-28 DIAGNOSIS — I472 Ventricular tachycardia, unspecified: Secondary | ICD-10-CM

## 2014-12-28 DIAGNOSIS — I428 Other cardiomyopathies: Secondary | ICD-10-CM | POA: Insufficient documentation

## 2014-12-28 DIAGNOSIS — J45909 Unspecified asthma, uncomplicated: Secondary | ICD-10-CM | POA: Diagnosis present

## 2014-12-28 DIAGNOSIS — I340 Nonrheumatic mitral (valve) insufficiency: Secondary | ICD-10-CM | POA: Diagnosis present

## 2014-12-28 DIAGNOSIS — E119 Type 2 diabetes mellitus without complications: Secondary | ICD-10-CM | POA: Diagnosis present

## 2014-12-28 DIAGNOSIS — G629 Polyneuropathy, unspecified: Secondary | ICD-10-CM | POA: Diagnosis present

## 2014-12-28 DIAGNOSIS — I10 Essential (primary) hypertension: Secondary | ICD-10-CM | POA: Diagnosis present

## 2014-12-28 DIAGNOSIS — I5022 Chronic systolic (congestive) heart failure: Secondary | ICD-10-CM | POA: Diagnosis present

## 2014-12-28 DIAGNOSIS — I429 Cardiomyopathy, unspecified: Secondary | ICD-10-CM | POA: Diagnosis present

## 2014-12-28 DIAGNOSIS — Z95810 Presence of automatic (implantable) cardiac defibrillator: Secondary | ICD-10-CM

## 2014-12-28 DIAGNOSIS — E876 Hypokalemia: Secondary | ICD-10-CM | POA: Diagnosis present

## 2014-12-28 DIAGNOSIS — Z6841 Body Mass Index (BMI) 40.0 and over, adult: Secondary | ICD-10-CM

## 2014-12-28 DIAGNOSIS — K219 Gastro-esophageal reflux disease without esophagitis: Secondary | ICD-10-CM | POA: Diagnosis present

## 2014-12-28 DIAGNOSIS — Z4502 Encounter for adjustment and management of automatic implantable cardiac defibrillator: Secondary | ICD-10-CM

## 2014-12-28 DIAGNOSIS — I4721 Torsades de pointes: Secondary | ICD-10-CM | POA: Insufficient documentation

## 2014-12-28 DIAGNOSIS — G4733 Obstructive sleep apnea (adult) (pediatric): Secondary | ICD-10-CM | POA: Diagnosis present

## 2014-12-28 DIAGNOSIS — Z79899 Other long term (current) drug therapy: Secondary | ICD-10-CM

## 2014-12-28 DIAGNOSIS — N179 Acute kidney failure, unspecified: Secondary | ICD-10-CM | POA: Diagnosis present

## 2014-12-28 DIAGNOSIS — Z794 Long term (current) use of insulin: Secondary | ICD-10-CM

## 2014-12-28 LAB — I-STAT CHEM 8, ED
BUN: 40 mg/dL — AB (ref 6–23)
CALCIUM ION: 1.06 mmol/L — AB (ref 1.12–1.23)
CHLORIDE: 88 mmol/L — AB (ref 96–112)
Creatinine, Ser: 1.6 mg/dL — ABNORMAL HIGH (ref 0.50–1.10)
Glucose, Bld: 247 mg/dL — ABNORMAL HIGH (ref 70–99)
HCT: 55 % — ABNORMAL HIGH (ref 36.0–46.0)
Hemoglobin: 18.7 g/dL — ABNORMAL HIGH (ref 12.0–15.0)
Potassium: 2.9 mmol/L — ABNORMAL LOW (ref 3.5–5.1)
Sodium: 137 mmol/L (ref 135–145)
TCO2: 36 mmol/L (ref 0–100)

## 2014-12-28 LAB — I-STAT TROPONIN, ED: Troponin i, poc: 0.36 ng/mL (ref 0.00–0.08)

## 2014-12-28 MED ORDER — MAGNESIUM SULFATE 2 GM/50ML IV SOLN
2.0000 g | Freq: Once | INTRAVENOUS | Status: AC
  Administered 2014-12-28: 2 g via INTRAVENOUS

## 2014-12-28 MED ORDER — MAGNESIUM SULFATE 2 GM/50ML IV SOLN
INTRAVENOUS | Status: AC
  Filled 2014-12-28: qty 50

## 2014-12-28 MED ORDER — MIDAZOLAM HCL 2 MG/2ML IJ SOLN
2.0000 mg | Freq: Once | INTRAMUSCULAR | Status: AC
  Administered 2014-12-28: 2 mg via INTRAVENOUS
  Filled 2014-12-28: qty 2

## 2014-12-28 MED ORDER — SODIUM CHLORIDE 0.9 % IV BOLUS (SEPSIS)
1000.0000 mL | Freq: Once | INTRAVENOUS | Status: AC
  Administered 2014-12-28: 1000 mL via INTRAVENOUS

## 2014-12-28 MED ORDER — FENTANYL CITRATE (PF) 100 MCG/2ML IJ SOLN
50.0000 ug | Freq: Once | INTRAMUSCULAR | Status: AC
  Administered 2014-12-28: 50 ug via INTRAVENOUS
  Filled 2014-12-28: qty 2

## 2014-12-28 NOTE — ED Notes (Signed)
Cardiologist bedside

## 2014-12-29 ENCOUNTER — Ambulatory Visit (HOSPITAL_COMMUNITY): Payer: Medicare Other

## 2014-12-29 ENCOUNTER — Encounter (HOSPITAL_COMMUNITY): Payer: Self-pay | Admitting: *Deleted

## 2014-12-29 ENCOUNTER — Emergency Department (HOSPITAL_COMMUNITY): Payer: Medicare Other

## 2014-12-29 DIAGNOSIS — Z95810 Presence of automatic (implantable) cardiac defibrillator: Secondary | ICD-10-CM | POA: Diagnosis not present

## 2014-12-29 DIAGNOSIS — I472 Ventricular tachycardia, unspecified: Secondary | ICD-10-CM

## 2014-12-29 DIAGNOSIS — E860 Dehydration: Secondary | ICD-10-CM

## 2014-12-29 DIAGNOSIS — E876 Hypokalemia: Secondary | ICD-10-CM | POA: Diagnosis present

## 2014-12-29 DIAGNOSIS — I429 Cardiomyopathy, unspecified: Secondary | ICD-10-CM | POA: Diagnosis present

## 2014-12-29 DIAGNOSIS — I340 Nonrheumatic mitral (valve) insufficiency: Secondary | ICD-10-CM | POA: Diagnosis present

## 2014-12-29 DIAGNOSIS — I509 Heart failure, unspecified: Secondary | ICD-10-CM | POA: Diagnosis not present

## 2014-12-29 DIAGNOSIS — Z7982 Long term (current) use of aspirin: Secondary | ICD-10-CM | POA: Diagnosis not present

## 2014-12-29 DIAGNOSIS — E119 Type 2 diabetes mellitus without complications: Secondary | ICD-10-CM | POA: Diagnosis present

## 2014-12-29 DIAGNOSIS — I428 Other cardiomyopathies: Secondary | ICD-10-CM | POA: Insufficient documentation

## 2014-12-29 DIAGNOSIS — I5022 Chronic systolic (congestive) heart failure: Secondary | ICD-10-CM | POA: Diagnosis present

## 2014-12-29 DIAGNOSIS — Z6841 Body Mass Index (BMI) 40.0 and over, adult: Secondary | ICD-10-CM | POA: Diagnosis not present

## 2014-12-29 DIAGNOSIS — I70211 Atherosclerosis of native arteries of extremities with intermittent claudication, right leg: Secondary | ICD-10-CM | POA: Diagnosis not present

## 2014-12-29 DIAGNOSIS — G4733 Obstructive sleep apnea (adult) (pediatric): Secondary | ICD-10-CM | POA: Diagnosis present

## 2014-12-29 DIAGNOSIS — Z794 Long term (current) use of insulin: Secondary | ICD-10-CM | POA: Diagnosis not present

## 2014-12-29 DIAGNOSIS — J45909 Unspecified asthma, uncomplicated: Secondary | ICD-10-CM | POA: Diagnosis present

## 2014-12-29 DIAGNOSIS — G629 Polyneuropathy, unspecified: Secondary | ICD-10-CM | POA: Diagnosis present

## 2014-12-29 DIAGNOSIS — Z79899 Other long term (current) drug therapy: Secondary | ICD-10-CM | POA: Diagnosis not present

## 2014-12-29 DIAGNOSIS — K219 Gastro-esophageal reflux disease without esophagitis: Secondary | ICD-10-CM | POA: Diagnosis present

## 2014-12-29 DIAGNOSIS — Z87891 Personal history of nicotine dependence: Secondary | ICD-10-CM | POA: Diagnosis not present

## 2014-12-29 DIAGNOSIS — I10 Essential (primary) hypertension: Secondary | ICD-10-CM | POA: Diagnosis present

## 2014-12-29 DIAGNOSIS — N179 Acute kidney failure, unspecified: Secondary | ICD-10-CM | POA: Diagnosis present

## 2014-12-29 LAB — CBC WITH DIFFERENTIAL/PLATELET
BASOS PCT: 0 % (ref 0–1)
Basophils Absolute: 0 10*3/uL (ref 0.0–0.1)
Eosinophils Absolute: 0.2 10*3/uL (ref 0.0–0.7)
Eosinophils Relative: 2 % (ref 0–5)
HCT: 50.2 % — ABNORMAL HIGH (ref 36.0–46.0)
HEMOGLOBIN: 16.4 g/dL — AB (ref 12.0–15.0)
LYMPHS PCT: 46 % (ref 12–46)
Lymphs Abs: 4.6 10*3/uL — ABNORMAL HIGH (ref 0.7–4.0)
MCH: 29.9 pg (ref 26.0–34.0)
MCHC: 32.7 g/dL (ref 30.0–36.0)
MCV: 91.4 fL (ref 78.0–100.0)
MONOS PCT: 5 % (ref 3–12)
Monocytes Absolute: 0.5 10*3/uL (ref 0.1–1.0)
Neutro Abs: 4.7 10*3/uL (ref 1.7–7.7)
Neutrophils Relative %: 47 % (ref 43–77)
PLATELETS: ADEQUATE 10*3/uL (ref 150–400)
RBC: 5.49 MIL/uL — ABNORMAL HIGH (ref 3.87–5.11)
RDW: 13.7 % (ref 11.5–15.5)
WBC: 10 10*3/uL (ref 4.0–10.5)

## 2014-12-29 LAB — CBC
HCT: 44.8 % (ref 36.0–46.0)
Hemoglobin: 13.9 g/dL (ref 12.0–15.0)
MCH: 28.4 pg (ref 26.0–34.0)
MCHC: 31 g/dL (ref 30.0–36.0)
MCV: 91.6 fL (ref 78.0–100.0)
Platelets: 272 10*3/uL (ref 150–400)
RBC: 4.89 MIL/uL (ref 3.87–5.11)
RDW: 13.9 % (ref 11.5–15.5)
WBC: 7.8 10*3/uL (ref 4.0–10.5)

## 2014-12-29 LAB — MAGNESIUM
MAGNESIUM: 2.3 mg/dL (ref 1.5–2.5)
Magnesium: 1.5 mg/dL (ref 1.5–2.5)

## 2014-12-29 LAB — GLUCOSE, CAPILLARY
GLUCOSE-CAPILLARY: 130 mg/dL — AB (ref 70–99)
GLUCOSE-CAPILLARY: 133 mg/dL — AB (ref 70–99)
GLUCOSE-CAPILLARY: 141 mg/dL — AB (ref 70–99)
Glucose-Capillary: 197 mg/dL — ABNORMAL HIGH (ref 70–99)

## 2014-12-29 LAB — BASIC METABOLIC PANEL
Anion gap: 10 (ref 5–15)
Anion gap: 17 — ABNORMAL HIGH (ref 5–15)
BUN: 27 mg/dL — ABNORMAL HIGH (ref 6–23)
BUN: 29 mg/dL — ABNORMAL HIGH (ref 6–23)
CHLORIDE: 89 mmol/L — AB (ref 96–112)
CO2: 34 mmol/L — ABNORMAL HIGH (ref 19–32)
CO2: 39 mmol/L — ABNORMAL HIGH (ref 19–32)
CREATININE: 1.75 mg/dL — AB (ref 0.50–1.10)
Calcium: 9.3 mg/dL (ref 8.4–10.5)
Calcium: 9.6 mg/dL (ref 8.4–10.5)
Chloride: 85 mmol/L — ABNORMAL LOW (ref 96–112)
Creatinine, Ser: 1.4 mg/dL — ABNORMAL HIGH (ref 0.50–1.10)
GFR calc Af Amer: 39 mL/min — ABNORMAL LOW (ref >90)
GFR calc Af Amer: 51 mL/min — ABNORMAL LOW (ref >90)
GFR calc non Af Amer: 44 mL/min — ABNORMAL LOW (ref >90)
GFR, EST NON AFRICAN AMERICAN: 33 mL/min — AB (ref >90)
GLUCOSE: 238 mg/dL — AB (ref 70–99)
Glucose, Bld: 128 mg/dL — ABNORMAL HIGH (ref 70–99)
POTASSIUM: 3.9 mmol/L (ref 3.5–5.1)
Potassium: 2.9 mmol/L — ABNORMAL LOW (ref 3.5–5.1)
Sodium: 136 mmol/L (ref 135–145)
Sodium: 138 mmol/L (ref 135–145)

## 2014-12-29 LAB — TROPONIN I: Troponin I: 3.85 ng/mL (ref <0.031)

## 2014-12-29 LAB — MRSA PCR SCREENING: MRSA by PCR: NEGATIVE

## 2014-12-29 MED ORDER — INSULIN ASPART 100 UNIT/ML ~~LOC~~ SOLN
0.0000 [IU] | SUBCUTANEOUS | Status: DC
  Administered 2014-12-29: 2 [IU] via SUBCUTANEOUS
  Administered 2014-12-29: 3 [IU] via SUBCUTANEOUS
  Administered 2014-12-29 (×2): 2 [IU] via SUBCUTANEOUS
  Administered 2014-12-29: 3 [IU] via SUBCUTANEOUS
  Administered 2014-12-30: 2 [IU] via SUBCUTANEOUS
  Administered 2014-12-30 – 2014-12-31 (×6): 3 [IU] via SUBCUTANEOUS
  Administered 2014-12-31: 5 [IU] via SUBCUTANEOUS
  Administered 2014-12-31 (×3): 3 [IU] via SUBCUTANEOUS
  Administered 2014-12-31: 5 [IU] via SUBCUTANEOUS
  Administered 2015-01-01: 2 [IU] via SUBCUTANEOUS
  Administered 2015-01-01: 3 [IU] via SUBCUTANEOUS

## 2014-12-29 MED ORDER — SODIUM CHLORIDE 0.9 % IJ SOLN: 3.0000 mL | INTRAMUSCULAR | Status: DC | PRN

## 2014-12-29 MED ORDER — CARVEDILOL 6.25 MG PO TABS
6.2500 mg | ORAL_TABLET | Freq: Two times a day (BID) | ORAL | Status: DC
  Administered 2014-12-29 – 2015-01-01 (×6): 6.25 mg via ORAL
  Filled 2014-12-29 (×9): qty 1

## 2014-12-29 MED ORDER — GABAPENTIN 600 MG PO TABS
600.0000 mg | ORAL_TABLET | Freq: Three times a day (TID) | ORAL | Status: DC
  Administered 2014-12-29 – 2015-01-01 (×13): 600 mg via ORAL
  Filled 2014-12-29 (×19): qty 1

## 2014-12-29 MED ORDER — SPIRONOLACTONE 25 MG PO TABS
25.0000 mg | ORAL_TABLET | Freq: Two times a day (BID) | ORAL | Status: DC
  Administered 2014-12-29 – 2015-01-01 (×6): 25 mg via ORAL
  Filled 2014-12-29 (×9): qty 1

## 2014-12-29 MED ORDER — FENTANYL CITRATE (PF) 100 MCG/2ML IJ SOLN: 50.0000 ug | INTRAMUSCULAR | Status: DC | PRN

## 2014-12-29 MED ORDER — ENOXAPARIN SODIUM 40 MG/0.4ML ~~LOC~~ SOLN
40.0000 mg | Freq: Every day | SUBCUTANEOUS | Status: DC
  Administered 2014-12-29 – 2014-12-30 (×2): 40 mg via SUBCUTANEOUS
  Filled 2014-12-29 (×2): qty 0.4

## 2014-12-29 MED ORDER — HYDROCODONE-ACETAMINOPHEN 5-325 MG PO TABS
1.0000 | ORAL_TABLET | Freq: Four times a day (QID) | ORAL | Status: DC | PRN
  Administered 2014-12-29 – 2014-12-31 (×4): 1 via ORAL
  Filled 2014-12-29 (×4): qty 1

## 2014-12-29 MED ORDER — SODIUM CHLORIDE 0.9 % IJ SOLN
3.0000 mL | Freq: Two times a day (BID) | INTRAMUSCULAR | Status: DC
  Administered 2014-12-29 – 2014-12-31 (×4): 3 mL via INTRAVENOUS

## 2014-12-29 MED ORDER — MAGNESIUM OXIDE 400 (241.3 MG) MG PO TABS
400.0000 mg | ORAL_TABLET | Freq: Every day | ORAL | Status: DC
  Administered 2014-12-29 – 2015-01-01 (×4): 400 mg via ORAL
  Filled 2014-12-29 (×4): qty 1

## 2014-12-29 MED ORDER — POTASSIUM CHLORIDE 10 MEQ/100ML IV SOLN
10.0000 meq | Freq: Once | INTRAVENOUS | Status: AC
  Administered 2014-12-29: 10 meq via INTRAVENOUS

## 2014-12-29 MED ORDER — POTASSIUM CHLORIDE CRYS ER 20 MEQ PO TBCR
40.0000 meq | EXTENDED_RELEASE_TABLET | Freq: Two times a day (BID) | ORAL | Status: DC
  Administered 2014-12-29 – 2014-12-30 (×3): 40 meq via ORAL
  Filled 2014-12-29 (×7): qty 2

## 2014-12-29 MED ORDER — ASPIRIN EC 81 MG PO TBEC
81.0000 mg | DELAYED_RELEASE_TABLET | Freq: Every day | ORAL | Status: DC
  Administered 2014-12-29 – 2015-01-01 (×4): 81 mg via ORAL
  Filled 2014-12-29 (×4): qty 1

## 2014-12-29 MED ORDER — AMIODARONE HCL 200 MG PO TABS
200.0000 mg | ORAL_TABLET | Freq: Every day | ORAL | Status: DC
  Administered 2014-12-30 – 2015-01-01 (×3): 200 mg via ORAL
  Filled 2014-12-29 (×3): qty 1

## 2014-12-29 MED ORDER — MIDAZOLAM HCL 2 MG/2ML IJ SOLN: 2.0000 mg | INTRAMUSCULAR | Status: DC | PRN

## 2014-12-29 MED ORDER — ACETAMINOPHEN 325 MG PO TABS
650.0000 mg | ORAL_TABLET | ORAL | Status: DC | PRN
  Administered 2014-12-31: 650 mg via ORAL
  Filled 2014-12-29: qty 2

## 2014-12-29 MED ORDER — MAGNESIUM SULFATE 2 GM/50ML IV SOLN
2.0000 g | Freq: Once | INTRAVENOUS | Status: AC
  Administered 2014-12-29: 2 g via INTRAVENOUS
  Filled 2014-12-29: qty 50

## 2014-12-29 MED ORDER — POTASSIUM CHLORIDE 20 MEQ/15ML (10%) PO SOLN
40.0000 meq | Freq: Once | ORAL | Status: AC
  Administered 2014-12-29: 40 meq via ORAL
  Filled 2014-12-29: qty 30

## 2014-12-29 MED ORDER — POTASSIUM CHLORIDE 10 MEQ/100ML IV SOLN
10.0000 meq | Freq: Once | INTRAVENOUS | Status: DC
  Filled 2014-12-29: qty 100

## 2014-12-29 MED ORDER — PANTOPRAZOLE SODIUM 40 MG PO TBEC
40.0000 mg | DELAYED_RELEASE_TABLET | Freq: Every day | ORAL | Status: DC
Start: 2014-12-29 — End: 2015-01-01
  Administered 2014-12-29 – 2015-01-01 (×4): 40 mg via ORAL
  Filled 2014-12-29 (×4): qty 1

## 2014-12-29 MED ORDER — SPIRONOLACTONE 25 MG PO TABS
25.0000 mg | ORAL_TABLET | Freq: Every day | ORAL | Status: DC
  Administered 2014-12-29: 25 mg via ORAL
  Filled 2014-12-29: qty 1

## 2014-12-29 MED ORDER — AMIODARONE HCL 200 MG PO TABS
200.0000 mg | ORAL_TABLET | Freq: Two times a day (BID) | ORAL | Status: DC
  Administered 2014-12-29 (×2): 200 mg via ORAL
  Filled 2014-12-29 (×5): qty 1

## 2014-12-29 MED ORDER — SODIUM CHLORIDE 0.9 % IV SOLN: 250.0000 mL | INTRAVENOUS | Status: DC | PRN

## 2014-12-29 NOTE — Progress Notes (Signed)
eLink Physician-Brief Progress Note Patient Name: [name] DOB: [birth year] MRN: [id number]   Date of Service  12/29/2014  HPI/Events of Note  54 F with MMP including non-ischemic CM s/p implanted defib presenting to ED with torsade.  Given Mag and potassium.  Now HD stable with HR of 101, BP of 118/77 (86) and sats of 99% and RR of 15.  eICU Interventions  Plan of care per cardiology service Continue to monitor via [facility]        DETERDING,ELIZABETH 12/29/2014, 2:02 AM

## 2014-12-29 NOTE — Significant Event (Signed)
CRITICAL VALUE ALERT  Critical value received:  Troponin 3.85  Date of notification: 12-29-2014  Time of notification:  0856  Critical value read back: yes   Nurse who received alert:  Derrich Gaby  MD notified (1st page):  Cardiology PA with Dr. Ace Gins, face to face when they did rounds   Time of first page:  0856  MD notified (2nd page):  Time of second page:  Time MD responded:  Continue with present care

## 2014-12-29 NOTE — ED Provider Notes (Signed)
CSN: [id number]     Arrival date & time 12/28/14  2253 History   First MD Initiated Contact with Patient 12/28/14 2259     Chief Complaint  Patient presents with  . AICD Problem     (Consider location/radiation/quality/duration/timing/severity/associated sxs/prior Treatment) Patient is a 49 y.o. female presenting with chest pain. The history is provided by the patient and the EMS personnel. No language interpreter was used.  Chest Pain Pain location:  Substernal area Pain quality: aching   Pain radiates to:  Does not radiate Pain radiates to the back: no   Pain severity:  Severe Onset quality:  Sudden Timing:  Intermittent Progression:  Waxing and waning Chronicity:  New Context comment:  AICD firing Relieved by:  Nothing Exacerbated by: defibrillations. Associated symptoms: AICD problem, anxiety, nausea, palpitations and shortness of breath   Associated symptoms: no abdominal pain, no anorexia, no back pain, no claudication, no cough, no diaphoresis, no dizziness, no fatigue, no fever, no headache, not vomiting and no weakness   Risk factors: high cholesterol, hypertension and obesity   Risk factors: no immobilization, not female and no prior DVT/PE     Past Medical History  Diagnosis Date  . CHF (congestive heart failure)     a. EF 30-35%, RV mildly dilated (difficult study 11/2013) b. RHC (12/2013): RA 38/37 (35), RV 90/28, PA 95/51 (71), PCWP 54, PA 40%, AO 96 %, CO/CI Fick 3.2/1.4, PVR 5.3   . Nonischemic cardiomyopathy 12/2013    a. LHC (12/2013) normal coronary anatomy   . Mitral regurgitation     moderate to severe  . S/P cardiac catheterization   . LBBB (left bundle branch block)     s/p [employer] CRT-D  . HTN (hypertension)   . Asthma   . IBS (irritable bowel syndrome)     with primarily constipation  . Morbid obesity   . GERD (gastroesophageal reflux disease)   . IDA (iron deficiency anemia)     2o TO SB AVMS, Hx parenteral iron Dr Mariel Sleet  . AVM  (arteriovenous malformation)     Small bowel, s/p duble balloon enteroscopy/APC jejunum Dr Gwinda Passe [facility]) 01/23/2011  . Anemia, chronic disease   . Peripheral neuropathy   . OSA (obstructive sleep apnea)     on CPAP qhs  . GI bleed 06/2009    Hgb 7.5, ferritin 7, transfusion, iron IV  . Dyspnea on exertion     chronic  . Diabetes mellitus without complication   . ICD (implantable cardioverter-defibrillator), single, in situ 07/28/2014 interrogation    Pt shocked 3x since 07/20/14---pt intentionally unplugged Latitude, remote received 07/27/14 once plugged in. Pt unaware of shock on 07/27/14. Thresholds and sensing consistent with previous device measurements. Lead impedance trends stable over time.    Past Surgical History  Procedure Laterality Date  . Crdt-implantation  4/07    [employer]. remote- yes  . Appendectomy    . Cholecystectomy      biliary dyskinesia  . Mastectomy  2003    left, partial  . Knee arthroscopy  2005    left  . Small bowel enteroscopy  MAY 2012 [facility] DR. GILLIAM    SB AVMS s/p APC  . Colonoscopy  OCT 2010/SEP 2011    tortuous colon, 3 polyps-benign(2010),   . Upper gastrointestinal endoscopy  '08, '10, SEP 2011    mild antral gastritis (2010), negative SB bx (2010), incomlpete Schatzki's ring (2011)  . Small bowel enteroscopy  SEP 2011 [facility]  Fields-NO AVMS  . Givens capsule study  NOV 2010     NO AVMS, normal  . Irrigation and debridement abscess  06/25/2012    Procedure: MINOR INCISION AND DRAINAGE OF ABSCESS;  Surgeon: [name], MD;  Location: [facility];  Service: General;  Laterality: [name];  Incision & Drainage of Infected Sebaceous Cyst on Chest  . Breast surgery    . Cardiac defibrillator placement      [employer]  . Implantable cardioverter defibrillator generator change Left 02/15/2012    Procedure: IMPLANTABLE CARDIOVERTER DEFIBRILLATOR GENERATOR CHANGE;  Surgeon: Marinus Maw, MD;  Location: [facility] CATH LAB;  Service:  Cardiovascular;  Laterality: Left;  . Left and right heart catheterization with coronary/graft angiogram  12/12/2013    Procedure: LEFT AND RIGHT HEART CATHETERIZATION WITH Isabel Caprice;  Surgeon: Peter M Swaziland, MD;  Location: [facility] CATH LAB;  Service: Cardiovascular;;   Family History  Problem Relation Age of Onset  . Colon cancer Neg Hx     no family Hx of polyps too, uncle  . Cervical cancer Mother   . Hypertension Mother   . Cancer Mother   . Heart disease Father   . Hypertension Father   . GER disease Father   . Heart attack Father   . Bleeding Disorder Father   . Diabetes Sister   . Heart disease Sister   . Hypertension Sister    History  Substance Use Topics  . Smoking status: Former Smoker -- 0.50 packs/day for 20 years    Types: Cigarettes    Quit date: 11/19/2009  . Smokeless tobacco: Former Neurosurgeon  . Alcohol Use: No   OB History    Gravida Para Term Preterm AB TAB SAB Ectopic Multiple Living   Review of Systems  Constitutional: Negative for fever, diaphoresis and fatigue.  Respiratory: Positive for chest tightness and shortness of breath. Negative for cough and wheezing.   Cardiovascular: Positive for chest pain and palpitations. Negative for claudication.  Gastrointestinal: Positive for nausea. Negative for vomiting, abdominal pain and anorexia.  Musculoskeletal: Negative for back pain.  Neurological: Positive for light-headedness. Negative for dizziness, weakness and headaches.  Psychiatric/Behavioral: Negative for confusion.  All other systems reviewed and are negative.     Allergies  Cymbalta; Trazodone and nefazodone; and Lyrica  Home Medications   Prior to Admission medications   Medication Sig Start Date End Date Taking? Authorizing Provider  albuterol (PROVENTIL HFA;VENTOLIN HFA) 108 (90 BASE) MCG/ACT inhaler Inhale 2 puffs into the lungs every 6 (six) hours as needed for wheezing or shortness of breath.   Yes  Historical Provider, MD  albuterol (PROVENTIL) (5 MG/ML) 0.5% nebulizer solution Take 0.5 mLs (2.5 mg total) by nebulization every 4 (four) hours as needed for wheezing or shortness of breath. 11/21/12  Yes Gareth Morgan, MD  allopurinol (ZYLOPRIM) 300 MG tablet Take 1 tablet (300 mg total) by mouth daily. 08/14/14  Yes [name] D Clegg, NP  amiodarone (PACERONE) 200 MG tablet Take 1 tablet (200 mg total) by mouth 2 (two) times daily. 11/23/14  Yes Laurey Morale, MD  aspirin EC 81 MG EC tablet Take 1 tablet (81 mg total) by mouth daily. 12/16/13  Yes Aundria Rud, NP  carvedilol (COREG) 6.25 MG tablet Take 1 tablet (6.25 mg total) by mouth 2 (two) times daily with a meal. 10/19/14  Yes Marinus Maw, MD  cetirizine (ZYRTEC) 10 MG tablet  Take 10 mg by mouth daily as needed for allergies.    Yes Historical Provider, MD  cyclobenzaprine (FLEXERIL) 10 MG tablet Take 10 mg by mouth 3 (three) times daily as needed for muscle spasms.    Yes Historical Provider, MD  digoxin (LANOXIN) 0.125 MG tablet Take 1 tablet (0.125 mg total) by mouth daily. 05/05/14  Yes Aundria Rud, NP  ferrous sulfate 325 (65 FE) MG tablet Take 325 mg by mouth 3 (three) times daily.   Yes Historical Provider, MD  gabapentin (NEURONTIN) 300 MG capsule Take 600 mg by mouth 4 (four) times daily.   Yes Historical Provider, MD  HYDROcodone-acetaminophen (NORCO) 10-325 MG per tablet Take 1 tablet by mouth every 4 (four) hours as needed for moderate pain.  11/11/13  Yes Historical Provider, MD  insulin aspart (NOVOLOG) 100 UNIT/ML injection Inject 0-20 Units into the skin 3 (three) times daily with meals. 06/28/12  Yes Gareth Morgan, MD  insulin detemir (LEVEMIR) 100 UNIT/ML injection Inject 100 Units into the skin at bedtime.    Yes Historical Provider, MD  ivabradine (CORLANOR) 5 MG TABS tablet Take 0.5 tablets (2.5 mg total) by mouth 2 (two) times daily with a meal. 08/21/14  Yes Dolores Patty, MD  losartan (COZAAR) 50 MG tablet Take 1  tablet a.m and 1/2 tablet p.m Patient taking differently: Take 25-50 mg by mouth 2 (two) times daily. Take 1 tablet a.m and 1/2 tablet p.m 11/23/14  Yes Laurey Morale, MD  metolazone (ZAROXOLYN) 2.5 MG tablet Take one tablet by mouth 30 minutes before taking morning dose of Torsemide on MONDAYS AND FRIDAYS ONLY. 12/24/14  Yes Laurey Morale, MD  omeprazole (PRILOSEC) 40 MG capsule Take 1 capsule (40 mg total) by mouth daily. 03/27/13  Yes Marinus Maw, MD  potassium chloride SA (K-DUR,KLOR-CON) 20 MEQ tablet Take 1 tablet (20 mEq total) by mouth 2 (two) times daily. Take an EXTRA 40 meq (2 tablets) with Metolazone on Mondays and Fridays. 12/24/14  Yes Dolores Patty, MD  spironolactone (ALDACTONE) 25 MG tablet Take 1 tablet (25 mg total) by mouth daily. 09/03/14  Yes Bevelyn Buckles Bensimhon, MD  temazepam (RESTORIL) 30 MG capsule Take 30 mg by mouth at bedtime.   Yes Historical Provider, MD  torsemide (DEMADEX) 20 MG tablet Take 4 tablets (80 mg total) by mouth 2 (two) times daily. 11/23/14  Yes Laurey Morale, MD  vitamin E 400 UNIT capsule Take 400 Units by mouth daily.   Yes Historical Provider, MD  isosorbide-hydrALAZINE (BIDIL) 20-37.5 MG per tablet Take 0.5 tablets by mouth 3 (three) times daily. Patient not taking: Reported on 12/29/2014 12/24/14   Laurey Morale, MD   BP 102/71 mmHg  Pulse 80  Temp(Src) 98.5 F (36.9 C) (Oral)  Resp 23  Ht 5\' 5"  (1.651 m)  Wt 269 lb (122.018 kg)  BMI 44.76 kg/m2  SpO2 95%  LMP 06/21/2012 Physical Exam  Constitutional: Vital signs are normal. She appears well-developed and well-nourished. She is active and cooperative. She has a sickly appearance. No distress. Face mask in place.  HENT:  Head: Normocephalic and atraumatic.  Nose: Nose normal.  Mouth/Throat: Oropharynx is clear and moist. No oropharyngeal exudate.  Eyes: EOM are normal. Pupils are equal, round, and reactive to light.  Neck: Normal range of motion. Neck supple.  Cardiovascular:  Normal rate, regular rhythm, normal heart sounds and intact distal pulses.   No murmur heard. Pulmonary/Chest: Effort normal and breath sounds normal. No  respiratory distress. She has no wheezes. She exhibits no tenderness.  ICD palpable at left upper chest wall, nontender to palpation  Abdominal: Soft. There is no tenderness. There is no rebound and no guarding.  Musculoskeletal: Normal range of motion. She exhibits no tenderness.  Lymphadenopathy:    She has no cervical adenopathy.  Neurological: She is alert. No cranial nerve deficit. Coordination normal.  Skin: Skin is warm and dry. She is not diaphoretic.  Psychiatric: She has a normal mood and affect. Her behavior is normal. Judgment and thought content normal.  Nursing note and vitals reviewed.   ED Course  Procedures (including critical care time) Labs Review Labs Reviewed  CBC WITH DIFFERENTIAL/PLATELET - Abnormal; Notable for the following:    RBC 5.49 (*)    Hemoglobin 16.4 (*)    HCT 50.2 (*)    Lymphs Abs 4.6 (*)    All other components within normal limits  BASIC METABOLIC PANEL - Abnormal; Notable for the following:    Potassium 2.9 (*)    Chloride 85 (*)    CO2 34 (*)    Glucose, Bld 238 (*)    BUN 29 (*)    Creatinine, Ser 1.75 (*)    GFR calc non Af Amer 33 (*)    GFR calc Af Amer 39 (*)    Anion gap 17 (*)    All other components within normal limits  TROPONIN I - Abnormal; Notable for the following:    Troponin I 3.85 (*)    All other components within normal limits  GLUCOSE, CAPILLARY - Abnormal; Notable for the following:    Glucose-Capillary 197 (*)    All other components within normal limits  GLUCOSE, CAPILLARY - Abnormal; Notable for the following:    Glucose-Capillary 130 (*)    All other components within normal limits  BASIC METABOLIC PANEL - Abnormal; Notable for the following:    Chloride 89 (*)    CO2 39 (*)    Glucose, Bld 128 (*)    BUN 27 (*)    Creatinine, Ser 1.40 (*)    GFR calc  non Af Amer 44 (*)    GFR calc Af Amer 51 (*)    All other components within normal limits  GLUCOSE, CAPILLARY - Abnormal; Notable for the following:    Glucose-Capillary 133 (*)    All other components within normal limits  I-STAT TROPOININ, ED - Abnormal; Notable for the following:    Troponin i, poc 0.36 (*)    All other components within normal limits  I-STAT CHEM 8, ED - Abnormal; Notable for the following:    Potassium 2.9 (*)    Chloride 88 (*)    BUN 40 (*)    Creatinine, Ser 1.60 (*)    Glucose, Bld 247 (*)    Calcium, Ion 1.06 (*)    Hemoglobin 18.7 (*)    HCT 55.0 (*)    All other components within normal limits  MRSA PCR SCREENING  MAGNESIUM  CBC  MAGNESIUM  BASIC METABOLIC PANEL  MAGNESIUM  DIGOXIN LEVEL  AMIODARONE LEVEL    Imaging Review Dg Chest Portable 1 View  12/29/2014   CLINICAL DATA:  Shortness of breath, AICD probable.  EXAM: PORTABLE CHEST - 1 VIEW  COMPARISON:  12/20/2014  FINDINGS: Left chest wall battery pack with incompletely imaged lead projecting over the expected location of the right ventricle the epicardial and right atrial lead positions are grossly similar. Cardiac enlargement. Central vascular congestion. Interstitial prominence.  Retrocardiac opacity. Small effusions not excluded. No pneumothorax. Osteopenia. Multilevel degenerative change.  IMPRESSION: Enlarged cardiac silhouette and central vascular congestion.  Interstitial prominence may reflect pulmonary edema (favored) or atypical infection.   Electronically Signed   By: Jearld Lesch M.D.   On: 12/29/2014 02:59     EKG Interpretation   Date/Time:  Monday December 28 2014 23:03:56 EDT Ventricular Rate:  113 PR Interval:    QRS Duration: 186 QT Interval:  411 QTC Calculation: 564 R Axis:   -51 Text Interpretation:  Atrial fibrillation Mixture of ventricularly paced  beats and intrinsic beats Right bundle branch block on intrinsic beats  Probable anterolateral infarct, recent When  compared with ECG of  12/20/2014, intrinsic beats are now present Confirmed by [facility]  MD, DAVID  ([id number]) on 12/28/2014 11:34:49 PM      MDM   Final diagnoses:  V tach  Torsades de pointes  Defibrillator discharge   Pt is a 49 yo F with hx of CHF and ICD who presented to the ED with chest pain and lightheadedness since receiving multiple shocks in the previous hour.  She reports that she felt fatigued earlier in the night but no specific sx.  She then felt a sensation of uneasiness and her defibrillator went off several times.  EMS presented and found her in recurrent episodes of VTach, which would then appropriately shock out of.  EMS was unable to start an IV en route, and she was a very difficult peripheral IV stick on arrival as well. Several attempts made at bedside by RNs and MDs until she was able to have a well flowing peripheral IV.  While attempting IV access, patient continued to have several episodes of defibrillation.  During that time she was seen going into clear torsades then would feel the aura sensation and would subsequently get shocked.  She was witnessed being shocked about 10 times in the ED before she began to slowly improve with treatment.  She was given IV magnesium and sedation/analgesia.  She then got a NS bolus and potassium repletion.   Cardiology was called to bedside on patients arrival.  Appreciate Dr. Alford Highland assistance.  He interrogated her device and was able to increase the baseline rate up to 100 to try to decrease the frequency of her shocks.  This seemed to help, along with the electrolyte improvements.  Patient was admitted to the ICU with cardiology primarily following her.    Patient was seen with ED Attending, Dr. Sharl Ma, MD  Lenell Antu, MD 12/30/14 [id number]  Dione Booze, MD 01/03/15 [phone]

## 2014-12-29 NOTE — Progress Notes (Signed)
Inpatient Diabetes Program Recommendations  AACE/ADA: New Consensus Statement on Inpatient Glycemic Control (2013)  Target Ranges:  Prepandial:   less than 140 mg/dL      Peak postprandial:   less than 180 mg/dL (1-2 hours)      Critically ill patients:  140 - 180 mg/dL   Reason for Assessment:  Results for [name], [name] (MRN [id number]) as of 12/29/2014 09:35  Ref. Range 12/29/2014 03:48 12/29/2014 07:19  Glucose-Capillary Latest Ref Range: 70-99 mg/dL 053 (H) 976 (H)    Diabetes history: Type 2 Diabetes Outpatient Diabetes medications: Levemir 100 units daily, Novolog SSI Current orders for Inpatient glycemic control:  Novolog moderate q 4 hours  Note that patient is NPO at this time.  If CBG's begin to rise greater than 180 mg/dL, consider adding 1/2 of home dose of Levemir while in the hospital.  Thanks, Beryl Meager, RN, BC-ADM Inpatient Diabetes Coordinator Pager [phone] (8a-5p)

## 2014-12-29 NOTE — Significant Event (Addendum)
Patient transferred to 2W24, taken via wheelchair. VS stable prior and during the transfer. Only belongings are clothes which are taken to her new room. Family made aware via telephone call by patient. Patient requested home pain med in route to 2W-will notify MD regarding this for order. Report given to receiving RN Herbert Seta.

## 2014-12-29 NOTE — Consult Note (Addendum)
ELECTROPHYSIOLOGY CONSULT NOTE    Patient ID: [name] MRN: [id number], DOB/AGE: 1966-02-14 49 y.o.  Admit date: 12/28/2014 Date of Consult: 12/29/2014  Primary Physician: [name], MD Primary Cardiologist: [name] Electrophysiologist: [name] Requesting Physician: Ace Gins  Reason for Consultation: Torsades  HPI:  [name] is a 49 y.o. female with a past medical history significant for non ischemic cardiomyopathy (s/p BSX CRTD), ventricular tachycardia s/p appropriate ICD therapy and on amiodarone, hypertension, diabetes, morbid obesity, sleep apnea (on CPAP), moderate MR, moderate TR.  She was recently seen in the HF clinic for volume overload and her diuretics were adjusted. She feels that she has diuresed well since then.  At home last night, she received multiple ICD shocks and presented to the ER for further evaluation. K was 2.9 on arrival, Mg was 1.5, creat 1.75.  Device interrogation demonstrated >20 ICD shocks for VF.  She continued to have Torsades in the ER and her pacing rate was increased to 100 which suppressed her ventricular ectopy.  She has had no further episodes since then.  Her K and Mg are being repleted.  Troponin is elevated this morning at 3.85. She states that she currently has some chest soreness, she has persistent shortness of breath and orthopnea. She has had some nausea, but no recent diarrhea, vomiting, fevers, or chills.    Last echo 11/2014 demonstrated EF 30-35%, moderate MR, LA massively dilated (47), moderate TR.   Past Medical History  Diagnosis Date  . CHF (congestive heart failure)     a. EF 30-35%, RV mildly dilated (difficult study 11/2013) b. RHC (12/2013): RA 38/37 (35), RV 90/28, PA 95/51 (71), PCWP 54, PA 40%, AO 96 %, CO/CI Fick 3.2/1.4, PVR 5.3   . Nonischemic cardiomyopathy 12/2013    a. LHC (12/2013) normal coronary anatomy   . Mitral regurgitation     moderate to severe  . S/P cardiac catheterization   . LBBB (left bundle  branch block)     s/p [employer] CRT-D  . HTN (hypertension)   . Asthma   . IBS (irritable bowel syndrome)     with primarily constipation  . Morbid obesity   . GERD (gastroesophageal reflux disease)   . IDA (iron deficiency anemia)     2o TO SB AVMS, Hx parenteral iron Dr Mariel Sleet  . AVM (arteriovenous malformation)     Small bowel, s/p duble balloon enteroscopy/APC jejunum Dr Gwinda Passe [facility]) 01/23/2011  . Anemia, chronic disease   . Peripheral neuropathy   . OSA (obstructive sleep apnea)     on CPAP qhs  . GI bleed 06/2009    Hgb 7.5, ferritin 7, transfusion, iron IV  . Dyspnea on exertion     chronic  . Diabetes mellitus without complication   . ICD (implantable cardioverter-defibrillator), single, in situ 07/28/2014 interrogation    Pt shocked 3x since 07/20/14---pt intentionally unplugged Latitude, remote received 07/27/14 once plugged in. Pt unaware of shock on 07/27/14. Thresholds and sensing consistent with previous device measurements. Lead impedance trends stable over time.      Surgical History:  Past Surgical History  Procedure Laterality Date  . Crdt-implantation  4/07    [employer]. remote- yes  . Appendectomy    . Cholecystectomy      biliary dyskinesia  . Mastectomy  2003    left, partial  . Knee arthroscopy  2005    left  . Small bowel enteroscopy  MAY 2012 [facility] DR. GILLIAM    SB  AVMS s/p APC  . Colonoscopy  OCT 2010/SEP 2011    tortuous colon, 3 polyps-benign(2010),   . Upper gastrointestinal endoscopy  '08, '10, SEP 2011    mild antral gastritis (2010), negative SB bx (2010), incomlpete Schatzki's ring (2011)  . Small bowel enteroscopy  SEP 2011 [facility]    Fields-NO AVMS  . Givens capsule study  NOV 2010     NO AVMS, normal  . Irrigation and debridement abscess  06/25/2012    Procedure: MINOR INCISION AND DRAINAGE OF ABSCESS;  Surgeon: [name], MD;  Location: [facility] ORS;  Service: General;  Laterality: [name];  Incision &  Drainage of Infected Sebaceous Cyst on Chest  . Breast surgery    . Cardiac defibrillator placement      [employer]  . Implantable cardioverter defibrillator generator change Left 02/15/2012    Procedure: IMPLANTABLE CARDIOVERTER DEFIBRILLATOR GENERATOR CHANGE;  Surgeon: Marinus Maw, MD;  Location: [facility] CATH LAB;  Service: Cardiovascular;  Laterality: Left;  . Left and right heart catheterization with coronary/graft angiogram  12/12/2013    Procedure: LEFT AND RIGHT HEART CATHETERIZATION WITH Isabel Caprice;  Surgeon: Peter M Swaziland, MD;  Location: [facility] CATH LAB;  Service: Cardiovascular;;     Prescriptions prior to admission  Medication Sig Dispense Refill Last Dose  . albuterol (PROVENTIL HFA;VENTOLIN HFA) 108 (90 BASE) MCG/ACT inhaler Inhale 2 puffs into the lungs every 6 (six) hours as needed for wheezing or shortness of breath.   unk  . albuterol (PROVENTIL) (5 MG/ML) 0.5% nebulizer solution Take 0.5 mLs (2.5 mg total) by nebulization every 4 (four) hours as needed for wheezing or shortness of breath. 20 mL 11 unk  . allopurinol (ZYLOPRIM) 300 MG tablet Take 1 tablet (300 mg total) by mouth daily. 30 tablet 6 12/28/2014 at Unknown time  . amiodarone (PACERONE) 200 MG tablet Take 1 tablet (200 mg total) by mouth 2 (two) times daily. 60 tablet 3 12/28/2014 at Unknown time  . aspirin EC 81 MG EC tablet Take 1 tablet (81 mg total) by mouth daily. 30 tablet 3 12/28/2014 at Unknown time  . carvedilol (COREG) 6.25 MG tablet Take 1 tablet (6.25 mg total) by mouth 2 (two) times daily with a meal. 60 tablet 3 12/28/2014 at 2030  . cetirizine (ZYRTEC) 10 MG tablet Take 10 mg by mouth daily as needed for allergies.    unk  . cyclobenzaprine (FLEXERIL) 10 MG tablet Take 10 mg by mouth 3 (three) times daily as needed for muscle spasms.    unk  . digoxin (LANOXIN) 0.125 MG tablet Take 1 tablet (0.125 mg total) by mouth daily. 90 tablet 3 12/28/2014 at Unknown time  . ferrous sulfate 325 (65 FE) MG  tablet Take 325 mg by mouth 3 (three) times daily.   12/28/2014 at Unknown time  . gabapentin (NEURONTIN) 300 MG capsule Take 600 mg by mouth 4 (four) times daily.   12/28/2014 at Unknown time  . HYDROcodone-acetaminophen (NORCO) 10-325 MG per tablet Take 1 tablet by mouth every 4 (four) hours as needed for moderate pain.    unk  . insulin aspart (NOVOLOG) 100 UNIT/ML injection Inject 0-20 Units into the skin 3 (three) times daily with meals. 1 vial 5 12/27/2014 at Unknown time  . insulin detemir (LEVEMIR) 100 UNIT/ML injection Inject 100 Units into the skin at bedtime.    12/27/2014 at Unknown time  . ivabradine (CORLANOR) 5 MG TABS tablet Take 0.5 tablets (2.5 mg total) by mouth 2 (two)  times daily with a meal. 60 tablet 2 12/28/2014 at Unknown time  . losartan (COZAAR) 50 MG tablet Take 1 tablet a.m and 1/2 tablet p.m (Patient taking differently: Take 25-50 mg by mouth 2 (two) times daily. Take 1 tablet a.m and 1/2 tablet p.m) 135 tablet 3 12/28/2014 at Unknown time  . metolazone (ZAROXOLYN) 2.5 MG tablet Take one tablet by mouth 30 minutes before taking morning dose of Torsemide on MONDAYS AND FRIDAYS ONLY. 10 tablet 3 12/28/2014 at Unknown time  . omeprazole (PRILOSEC) 40 MG capsule Take 1 capsule (40 mg total) by mouth daily. 30 capsule 6 12/28/2014 at Unknown time  . potassium chloride SA (K-DUR,KLOR-CON) 20 MEQ tablet Take 1 tablet (20 mEq total) by mouth 2 (two) times daily. Take an EXTRA 40 meq (2 tablets) with Metolazone on Mondays and Fridays. 190 tablet 3 12/28/2014 at Unknown time  . spironolactone (ALDACTONE) 25 MG tablet Take 1 tablet (25 mg total) by mouth daily. 30 tablet 3 12/28/2014 at Unknown time  . temazepam (RESTORIL) 30 MG capsule Take 30 mg by mouth at bedtime.   12/28/2014 at Unknown time  . torsemide (DEMADEX) 20 MG tablet Take 4 tablets (80 mg total) by mouth 2 (two) times daily. 180 tablet 3 12/28/2014 at Unknown time  . vitamin E 400 UNIT capsule Take 400 Units by mouth daily.    12/28/2014 at Unknown time  . isosorbide-hydrALAZINE (BIDIL) 20-37.5 MG per tablet Take 0.5 tablets by mouth 3 (three) times daily. (Patient not taking: Reported on 12/29/2014) 45 tablet 3     Inpatient Medications:  . amiodarone  200 mg Oral BID  . aspirin EC  81 mg Oral Daily  . enoxaparin (LOVENOX) injection  40 mg Subcutaneous Daily  . gabapentin  600 mg Oral TID AC & HS  . insulin aspart  0-15 Units Subcutaneous 6 times per day  . pantoprazole  40 mg Oral Daily  . potassium chloride  10 mEq Intravenous Once  . sodium chloride  3 mL Intravenous Q12H  . spironolactone  25 mg Oral Daily    Allergies:  Allergies  Allergen Reactions  . Cymbalta [Duloxetine Hcl] Other (See Comments)    "Spontaneous type behavior"  . Trazodone And Nefazodone Other (See Comments)    Nightmares   . Lyrica [Pregabalin] Swelling    History   Social History  . Marital Status: Single    Spouse Name: [name]  . Number of Children: 1  . Years of Education: [name]   Occupational History  . disabled    Social History Main Topics  . Smoking status: Former Smoker -- 0.50 packs/day for 20 years    Types: Cigarettes    Quit date: 11/19/2009  . Smokeless tobacco: Former Neurosurgeon  . Alcohol Use: No  . Drug Use: No  . Sexual Activity: Yes    Birth Control/ Protection: Implant   Other Topics Concern  . 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   Single- 1 daughter age 80.    Does not get regular exercise.       Family History  Problem Relation Age of Onset  . Colon cancer Neg Hx     no family Hx of polyps too, uncle  . Cervical cancer Mother   . Hypertension Mother   . Cancer Mother   . Heart disease Father   . Hypertension Father   . GER disease Father   . Heart attack Father   .  Bleeding Disorder Father   . Diabetes Sister   . Heart disease Sister   . Hypertension Sister      Review of Systems: All other systems reviewed and are otherwise negative  except as noted above.  Physical Exam: Filed Vitals:   12/29/14 0300 12/29/14 0400 12/29/14 0500 12/29/14 0722  BP: 123/69 122/68 106/65   Pulse: 100 100 100   Temp:  97.3 F (36.3 C)  97.8 F (36.6 C)  TempSrc:  Oral  Oral  Resp: 22 23 13    Height:      Weight:      SpO2: 100% 100% 100%     GEN- The patient is obese appearing, alert and oriented x 3 today.   HEENT: normocephalic, atraumatic; sclera clear, conjunctiva pink; hearing intact; oropharynx clear; neck supple  Lungs- Clear to ausculation bilaterally, normal work of breathing.  No wheezes, rales, rhonchi Heart- Regular rate and rhythm, 2/6 SEM GI- soft, non-tender, non-distended, bowel sounds present  Extremities- no clubbing, cyanosis, or edema; DP/PT/radial pulses 1+ bilaterally, compression stockings in place MS- no significant deformity or atrophy Skin- warm and dry, no rash or lesion Psych- euthymic mood, full affect Neuro- strength and sensation are intact  Labs:   Lab Results  Component Value Date   WBC 7.8 12/29/2014   HGB 13.9 12/29/2014   HCT 44.8 12/29/2014   MCV 91.6 12/29/2014   PLT 272 12/29/2014    Recent Labs Lab 12/28/14 2330 12/28/14 2350  NA 136 137  K 2.9* 2.9*  CL 85* 88*  CO2 34*  --   BUN 29* 40*  CREATININE 1.75* 1.60*  CALCIUM 9.6  --   GLUCOSE 238* 247*      Radiology/Studies: Dg Chest 2 View 12/21/2014   CLINICAL DATA:  Shortness of breath, chest discomfort for tonight.  EXAM: CHEST  2 VIEW  COMPARISON:  08/06/2014  FINDINGS: Multi lead left-sided pacemaker remains in place. Cardiomediastinal contours and cardiomegaly are unchanged. Limited assessment of the left lung base due to soft tissue attenuation. Mild vascular congestion. No pulmonary edema, confluent airspace disease, pleural effusion or pneumothorax. No acute osseous abnormalities are seen.  IMPRESSION: Stable cardiomegaly.  Mild vascular congestion.   Electronically Signed   By: Rubye Oaks M.D.   On: 12/21/2014  00:19   ZOX:WRUEA rhythm, frequent ventricular ectopy, QTc 564  TELEMETRY: AV pacing at 100  DEVICE HISTORY: BSX CRTD implanted 02/2012 by Dr [name].  Appropriate device therapy as recently as 08/2014 for monomorphic VT at .  Device interrogation reviewed today.   Assessment/Plan: 1.  Torsades in the setting of hypokalemia Electrolytes being repleted, repeat BMET pending this morning Will leave pacing rate at 100bpm for now, if no further Torsades by this afternoon and K normalized, will decrease base rate to 80 and decrease to 70 tomorrow if no further VT.  No driving x6 months  Have asked AHF team to see patient to help with diuretic management  2.  NICM/CHF Shortness of breath improved since last office visit with significant diuresis at home per patient Will need to hold diuretics for now until K normalized Management per AHF   3.  Elevated troponin Likely due to >20 ICD shocks No ischemic work up planned at this time  Electrophysiology team to see as needed while here. Please call with questions.  Signed, Gypsy Balsam, NP 12/29/2014 9:07 AM   I have seen, examined the patient, and reviewed the above assessment and plan.  Changes to above are made  where necessary.  Pt admitted with symptomatic sustained polymorphic VT/ torsades and appropriate ICD therapy in the setting of dehydration/ hypokalemia.  She has done much better with overdrive pacing and correction of electrolytes.  Reduce pacing rate to 70 bpm tomorrow. Check dig level and amiodarone level.  Agree with holding digoxin.  Would reduce amiodarone to 200mg  daily.  Titrate coreg as able. No driving x 6 months.  Transfer to telemetry. Pt is clinically quite ill with high risk of decompensation and sudden death.  A high level of decision making is required for this encounter.  Co Sign: Hillis Range, MD 12/29/2014 3:07 PM

## 2014-12-29 NOTE — Progress Notes (Addendum)
Patient ID: [name], female   DOB: 05/15/1966, 49 y.o.   MRN: [id number]   SUBJECTIVE: No further VT, she is set to overdrive pace at 100 bpm.  She is fatigued and her chest hurts after getting multiple shocks.   Scheduled Meds: . amiodarone  200 mg Oral BID  . aspirin EC  81 mg Oral Daily  . enoxaparin (LOVENOX) injection  40 mg Subcutaneous Daily  . gabapentin  600 mg Oral TID AC & HS  . insulin aspart  0-15 Units Subcutaneous 6 times per day  . pantoprazole  40 mg Oral Daily  . potassium chloride  10 mEq Intravenous Once  . sodium chloride  3 mL Intravenous Q12H  . spironolactone  25 mg Oral Daily   Continuous Infusions:  PRN Meds:.sodium chloride, acetaminophen, fentaNYL (SUBLIMAZE) injection, midazolam, sodium chloride    Filed Vitals:   12/29/14 0722 12/29/14 0800 12/29/14 0900 12/29/14 1000  BP:  117/72 116/93 111/71  Pulse:  100 100 100  Temp: 97.8 F (36.6 C)     TempSrc: Oral     Resp:  18    Height:      Weight:      SpO2:  100% 100% 100%    Intake/Output Summary (Last 24 hours) at 12/29/14 1124 Last data filed at 12/29/14 1000  Gross per 24 hour  Intake    230 ml  Output   1425 ml  Net  -1195 ml    LABS: Basic Metabolic Panel:  Recent Labs  07/07/24 2330 12/28/14 2350 12/29/14 0748  NA 136 137  --   K 2.9* 2.9*  --   CL 85* 88*  --   CO2 34*  --   --   GLUCOSE 238* 247*  --   BUN 29* 40*  --   CREATININE 1.75* 1.60*  --   CALCIUM 9.6  --   --   MG 1.5  --  2.3   Liver Function Tests: No results for input(s): AST, ALT, ALKPHOS, BILITOT, PROT, ALBUMIN in the last 72 hours. No results for input(s): LIPASE, AMYLASE in the last 72 hours. CBC:  Recent Labs  12/28/14 2330 12/28/14 2350 12/29/14 0748  WBC 10.0  --  7.8  NEUTROABS 4.7  --   --   HGB 16.4* 18.7* 13.9  HCT 50.2* 55.0* 44.8  MCV 91.4  --  91.6  PLT PLATELET CLUMPS NOTED ON SMEAR, COUNT APPEARS ADEQUATE  --  272   Cardiac Enzymes:  Recent Labs  12/29/14 0748    TROPONINI 3.85*   BNP: Invalid input(s): POCBNP D-Dimer: No results for input(s): DDIMER in the last 72 hours. Hemoglobin A1C: No results for input(s): HGBA1C in the last 72 hours. Fasting Lipid Panel: No results for input(s): CHOL, HDL, LDLCALC, TRIG, CHOLHDL, LDLDIRECT in the last 72 hours. Thyroid Function Tests: No results for input(s): TSH, T4TOTAL, T3FREE, THYROIDAB in the last 72 hours.  Invalid input(s): FREET3 Anemia Panel: No results for input(s): VITAMINB12, FOLATE, FERRITIN, TIBC, IRON, RETICCTPCT in the last 72 hours.  RADIOLOGY: Dg Chest 2 View  12/21/2014   CLINICAL DATA:  Shortness of breath, chest discomfort for tonight.  EXAM: CHEST  2 VIEW  COMPARISON:  08/06/2014  FINDINGS: Multi lead left-sided pacemaker remains in place. Cardiomediastinal contours and cardiomegaly are unchanged. Limited assessment of the left lung base due to soft tissue attenuation. Mild vascular congestion. No pulmonary edema, confluent airspace disease, pleural effusion or pneumothorax. No acute osseous abnormalities are seen.  IMPRESSION: Stable cardiomegaly.  Mild vascular congestion.   Electronically Signed   By: Rubye Oaks M.D.   On: 12/21/2014 00:19   Dg Chest Portable 1 View  12/29/2014   CLINICAL DATA:  Shortness of breath, AICD probable.  EXAM: PORTABLE CHEST - 1 VIEW  COMPARISON:  12/20/2014  FINDINGS: Left chest wall battery pack with incompletely imaged lead projecting over the expected location of the right ventricle the epicardial and right atrial lead positions are grossly similar. Cardiac enlargement. Central vascular congestion. Interstitial prominence. Retrocardiac opacity. Small effusions not excluded. No pneumothorax. Osteopenia. Multilevel degenerative change.  IMPRESSION: Enlarged cardiac silhouette and central vascular congestion.  Interstitial prominence may reflect pulmonary edema (favored) or atypical infection.   Electronically Signed   By: Jearld Lesch M.D.   On:  12/29/2014 02:59    PHYSICAL EXAM General: NAD Neck: Thick, JVP 8 cm, no thyromegaly or thyroid nodule.  Lungs: Clear to auscultation bilaterally with normal respiratory effort. CV: Nondisplaced PMI.  Heart regular S1/S2, no S3/S4, no murmur.  No peripheral edema.   Abdomen: Soft, nontender, no hepatosplenomegaly, no distention.  Neurologic: Alert and oriented x 3.  Psych: Normal affect. Extremities: No clubbing or cyanosis.   TELEMETRY: Reviewed telemetry pt paced at 100 bpm  ASSESSMENT AND PLAN: 49 yo with history of nonischemic cardiomyopathy was admitted after getting multiple ICD shocks for polymorphic VT in the setting of hypokalemia and hypomagnesemia (diuretics recently increased).  1. VT: Polymorphic VT likely due to hypokalemia and hypomagnesemia in setting of increased diuretics.  She is going to need effective diuresis so will have to watch K and Mg closely.   - Continue overdrive pacing for now, will need to go back to baseline, likely if K is close to normal when repeated this am.  - BMET now.  Mg ok this morning.  - Will start supplemental magnesium.  - Increase home KCl to 40 bid on days she is not taking metolazone and 80 bid if she takes metolazone. Will likely decrease metolazone from twice a week to once a week.  - Increase spironolactone to bid - Restart Coreg, continue amiodarone.  - No driving x 6 months.  2. Chronic Systolic Heart Failure: Nonischemic cardiomyopathy, s/p [employer] CRT-D. EF 30-35% (11/2014). CPX in 6/15 showed mild cardiac limitation but main issue seemed to be severe ventilatory limitation due to obesity and restrictive physiology. NYHA class IIIb symptoms recently.  Metolazone was added to regimen at last appointment, weight down a lot but with low K and Mg, had polymorphic VT.  Creatinine up from baseline.  - Restart Coreg today.  - As above, increase spironolactone to 25 mg bid - Would decrease losartan back to 25 mg bid, probably start  tomorrow.  - If BP stable tomorrow, back on Bidil 1/2 tab tid at that time.  - Will probably start back on torsemide tomorrow, hold metolazone for now.  May need once a week to keep volume status ideal.   3. OSA: Using CPAP.  4. Morbid Obesity: She is going to consider bariatric surgery eventually. Needs watch portions.  5. Leg weakness: Difficult to palpate pedal pulses. Wants to do peripheral arterial doppler evaluation while inpatient, I will order.  6. AKI: Creatinine up to 1.75 with increased diuresis.  As above, will back down on metolazone to probably once a week and decrease losartan back to 25 bid.  7. Elevated troponin: Suspect this is due to multiple shocks.   [name] 12/29/2014 11:35 AM

## 2014-12-29 NOTE — H&P (Cosign Needed)
Patient ID: IMOGINE HOGSETT MRN: [id number], DOB/AGE: April 13, 1966   Admit date: 12/28/2014   Primary Physician: [name], MD Primary Cardiologist: [name]  CC: Multiple ICD shocks  Problem List  Past Medical History  Diagnosis Date  . CHF (congestive heart failure)     a. EF 30-35%, RV mildly dilated (difficult study 11/2013) b. RHC (12/2013): RA 38/37 (35), RV 90/28, PA 95/51 (71), PCWP 54, PA 40%, AO 96 %, CO/CI Fick 3.2/1.4, PVR 5.3   . Nonischemic cardiomyopathy 12/2013    a. LHC (12/2013) normal coronary anatomy   . Mitral regurgitation     moderate to severe  . S/P cardiac catheterization   . LBBB (left bundle branch block)     s/p [employer] CRT-D  . HTN (hypertension)   . Asthma   . IBS (irritable bowel syndrome)     with primarily constipation  . Morbid obesity   . GERD (gastroesophageal reflux disease)   . IDA (iron deficiency anemia)     2o TO SB AVMS, Hx parenteral iron Dr Mariel Sleet  . AVM (arteriovenous malformation)     Small bowel, s/p duble balloon enteroscopy/APC jejunum Dr Gwinda Passe [facility]) 01/23/2011  . Anemia, chronic disease   . Peripheral neuropathy   . OSA (obstructive sleep apnea)     on CPAP qhs  . GI bleed 06/2009    Hgb 7.5, ferritin 7, transfusion, iron IV  . Dyspnea on exertion     chronic  . Diabetes mellitus without complication   . ICD (implantable cardioverter-defibrillator), single, in situ 07/28/2014 interrogation    Pt shocked 3x since 07/20/14---pt intentionally unplugged Latitude, remote received 07/27/14 once plugged in. Pt unaware of shock on 07/27/14. Thresholds and sensing consistent with previous device measurements. Lead impedance trends stable over time.     Past Surgical History  Procedure Laterality Date  . Crdt-implantation  4/07    [employer]. remote- yes  . Appendectomy    . Cholecystectomy      biliary dyskinesia  . Mastectomy  2003    left, partial  . Knee arthroscopy  2005    left  . Small  bowel enteroscopy  MAY 2012 [facility] DR. GILLIAM    SB AVMS s/p APC  . Colonoscopy  OCT 2010/SEP 2011    tortuous colon, 3 polyps-benign(2010),   . Upper gastrointestinal endoscopy  '08, '10, SEP 2011    mild antral gastritis (2010), negative SB bx (2010), incomlpete Schatzki's ring (2011)  . Small bowel enteroscopy  SEP 2011 [facility]    Fields-NO AVMS  . Givens capsule study  NOV 2010     NO AVMS, normal  . Irrigation and debridement abscess  06/25/2012    Procedure: MINOR INCISION AND DRAINAGE OF ABSCESS;  Surgeon: [name], MD;  Location: [facility] ORS;  Service: General;  Laterality: [name];  Incision & Drainage of Infected Sebaceous Cyst on Chest  . Breast surgery    . Cardiac defibrillator placement      [employer]  . Implantable cardioverter defibrillator generator change Left 02/15/2012    Procedure: IMPLANTABLE CARDIOVERTER DEFIBRILLATOR GENERATOR CHANGE;  Surgeon: Marinus Maw, MD;  Location: [facility] CATH LAB;  Service: Cardiovascular;  Laterality: Left;  . Left and right heart catheterization with coronary/graft angiogram  12/12/2013    Procedure: LEFT AND RIGHT HEART CATHETERIZATION WITH Isabel Caprice;  Surgeon: Peter M Swaziland, MD;  Location: [facility] CATH LAB;  Service: Cardiovascular;;     Allergies  Allergies  Allergen Reactions  .  Cymbalta [Duloxetine Hcl] Other (See Comments)    "Spontaneous type behavior"  . Trazodone And Nefazodone Other (See Comments)    Nightmares   . Lyrica [Pregabalin] Swelling    HPI The patient is a 49F with ahistory of NICMP s/p CRT-D (BSCi), HTN, DM2, prior recurrent VT who presents with multiple ICD shocks. She was doing well earlier today but around 6p started to feel somewhat unwell; she was unable to characterize further. At around 8:30p she had her first ICD shock and after that she described numerous ICD shocks, prompting EMS evaluation. In the ED she continued to have significant ectopy with multiple episodes of TdP and other  types of polymorphic VT. IV access was initially challenging but once obtained she was given magnesium. Labs revealed K 2.9 and Mg 1.5.  Of note, she was seen by Dr. [name] on 12/24/2014 and was more volume overloaded. Her diuretic regimen was increased to torsemide 80mg  BID with twice weekly metolazone. She reported that she had taken an episode of metolazone earlier with good response.    Home Medications  Prior to Admission medications   Medication Sig Start Date End Date Taking? Authorizing Provider  albuterol (PROVENTIL HFA;VENTOLIN HFA) 108 (90 BASE) MCG/ACT inhaler Inhale 2 puffs into the lungs every 6 (six) hours as needed for wheezing or shortness of breath.   Yes Historical Provider, MD  albuterol (PROVENTIL) (5 MG/ML) 0.5% nebulizer solution Take 0.5 mLs (2.5 mg total) by nebulization every 4 (four) hours as needed for wheezing or shortness of breath. 11/21/12  Yes Gareth Morgan, MD  allopurinol (ZYLOPRIM) 300 MG tablet Take 1 tablet (300 mg total) by mouth daily. 08/14/14  Yes [name] D Clegg, NP  amiodarone (PACERONE) 200 MG tablet Take 1 tablet (200 mg total) by mouth 2 (two) times daily. 11/23/14  Yes Laurey Morale, MD  aspirin EC 81 MG EC tablet Take 1 tablet (81 mg total) by mouth daily. 12/16/13  Yes Aundria Rud, NP  carvedilol (COREG) 6.25 MG tablet Take 1 tablet (6.25 mg total) by mouth 2 (two) times daily with a meal. 10/19/14  Yes Marinus Maw, MD  cetirizine (ZYRTEC) 10 MG tablet Take 10 mg by mouth daily as needed for allergies.    Yes Historical Provider, MD  cyclobenzaprine (FLEXERIL) 10 MG tablet Take 10 mg by mouth 3 (three) times daily as needed for muscle spasms.    Yes Historical Provider, MD  digoxin (LANOXIN) 0.125 MG tablet Take 1 tablet (0.125 mg total) by mouth daily. 05/05/14  Yes Aundria Rud, NP  ferrous sulfate 325 (65 FE) MG tablet Take 325 mg by mouth 3 (three) times daily.   Yes Historical Provider, MD  gabapentin (NEURONTIN) 300 MG capsule Take 600 mg by  mouth 4 (four) times daily.   Yes Historical Provider, MD  HYDROcodone-acetaminophen (NORCO) 10-325 MG per tablet Take 1 tablet by mouth every 4 (four) hours as needed for moderate pain.  11/11/13  Yes Historical Provider, MD  insulin aspart (NOVOLOG) 100 UNIT/ML injection Inject 0-20 Units into the skin 3 (three) times daily with meals. 06/28/12  Yes Gareth Morgan, MD  insulin detemir (LEVEMIR) 100 UNIT/ML injection Inject 100 Units into the skin at bedtime.    Yes Historical Provider, MD  ivabradine (CORLANOR) 5 MG TABS tablet Take 0.5 tablets (2.5 mg total) by mouth 2 (two) times daily with a meal. 08/21/14  Yes Dolores Patty, MD  losartan (COZAAR) 50 MG tablet Take 1 tablet a.m and 1/2  tablet p.m Patient taking differently: Take 25-50 mg by mouth 2 (two) times daily. Take 1 tablet a.m and 1/2 tablet p.m 11/23/14  Yes Laurey Morale, MD  metolazone (ZAROXOLYN) 2.5 MG tablet Take one tablet by mouth 30 minutes before taking morning dose of Torsemide on MONDAYS AND FRIDAYS ONLY. 12/24/14  Yes Laurey Morale, MD  omeprazole (PRILOSEC) 40 MG capsule Take 1 capsule (40 mg total) by mouth daily. 03/27/13  Yes Marinus Maw, MD  potassium chloride SA (K-DUR,KLOR-CON) 20 MEQ tablet Take 1 tablet (20 mEq total) by mouth 2 (two) times daily. Take an EXTRA 40 meq (2 tablets) with Metolazone on Mondays and Fridays. 12/24/14  Yes Dolores Patty, MD  spironolactone (ALDACTONE) 25 MG tablet Take 1 tablet (25 mg total) by mouth daily. 09/03/14  Yes Bevelyn Buckles Bensimhon, MD  temazepam (RESTORIL) 30 MG capsule Take 30 mg by mouth at bedtime.   Yes Historical Provider, MD  torsemide (DEMADEX) 20 MG tablet Take 4 tablets (80 mg total) by mouth 2 (two) times daily. 11/23/14  Yes Laurey Morale, MD  vitamin E 400 UNIT capsule Take 400 Units by mouth daily.   Yes Historical Provider, MD  isosorbide-hydrALAZINE (BIDIL) 20-37.5 MG per tablet Take 0.5 tablets by mouth 3 (three) times daily. Patient not taking: Reported  on 12/29/2014 12/24/14   Laurey Morale, MD    Family History  Family History  Problem Relation Age of Onset  . Colon cancer Neg Hx     no family Hx of polyps too, uncle  . Cervical cancer Mother   . Hypertension Mother   . Cancer Mother   . Heart disease Father   . Hypertension Father   . GER disease Father   . Heart attack Father   . Bleeding Disorder Father   . Diabetes Sister   . Heart disease Sister   . Hypertension Sister     Social History  History   Social History  . Marital Status: Single    Spouse Name: [name]  . Number of Children: 1  . Years of Education: [name]   Occupational History  . disabled    Social History Main Topics  . Smoking status: Former Smoker -- 0.50 packs/day for 20 years    Types: Cigarettes    Quit date: 11/19/2009  . Smokeless tobacco: Former Neurosurgeon  . Alcohol Use: No  . Drug Use: No  . Sexual Activity: Yes    Birth Control/ Protection: Implant   Other Topics Concern  . 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   Single- 1 daughter age 57.    Does not get regular exercise.       Review of Systems General:  No chills, fever, night sweats or weight changes.  Cardiovascular:  +chest pain (after shocks), +dyspnea on exertion, -edema, -orthopnea, +palpitations, +paroxysmal nocturnal dyspnea. Dermatological: No rash, lesions/masses Respiratory: No cough, dyspnea Urologic: No hematuria, dysuria Abdominal:   No nausea, vomiting, diarrhea, bright red blood per rectum, melena, or hematemesis Neurologic:  No visual changes, wkns, changes in mental status. All other systems reviewed and are otherwise negative except as noted above.  Physical Exam  Blood pressure 118/77, pulse 100, temperature 97.8 F (36.6 C), temperature source Oral, resp. rate 23, height 5\' 5"  (1.651 m), weight 269 lb (122.018 kg), last menstrual period 06/21/2012, SpO2 100 %.  General: Pleasant, in moderate distress Psych:  Normal affect. Neuro: Alert and  oriented X 3. Moves all extremities spontaneously. HEENT: Normal  Neck: Supple without bruits. JVP difficult to assess given neck size. Lungs:  Resp regular and unlabored, CTA. Heart: RRR no s3, s4, or murmurs. Abdomen: Soft, non-tender, non-distended, BS + x 4.  Extremities: No clubbing, cyanosis or edema. DP/PT/Radials 2+ and equal bilaterally.  Labs  Troponin [facility] of Care Test)  Recent Labs  12/28/14 2333  TROPIPOC 0.36*   No results for input(s): CKTOTAL, CKMB, TROPONINI in the last 72 hours. Lab Results  Component Value Date   WBC 10.0 12/28/2014   HGB 18.7* 12/28/2014   HCT 55.0* 12/28/2014   MCV 91.4 12/28/2014   PLT  12/28/2014    PLATELET CLUMPS NOTED ON SMEAR, COUNT APPEARS ADEQUATE    Recent Labs Lab 12/28/14 2330 12/28/14 2350  NA 136 137  K 2.9* 2.9*  CL 85* 88*  CO2 34*  --   BUN 29* 40*  CREATININE 1.75* 1.60*  CALCIUM 9.6  --   GLUCOSE 238* 247*   Lab Results  Component Value Date   CHOL 195 06/21/2012   HDL 45 06/21/2012   LDLCALC 103* 06/21/2012   TRIG 233* 06/21/2012   Lab Results  Component Value Date   DDIMER 0.47 12/08/2013     Radiology/Studies  Dg Chest 2 View  12/21/2014   CLINICAL DATA:  Shortness of breath, chest discomfort for tonight.  EXAM: CHEST  2 VIEW  COMPARISON:  08/06/2014  FINDINGS: Multi lead left-sided pacemaker remains in place. Cardiomediastinal contours and cardiomegaly are unchanged. Limited assessment of the left lung base due to soft tissue attenuation. Mild vascular congestion. No pulmonary edema, confluent airspace disease, pleural effusion or pneumothorax. No acute osseous abnormalities are seen.  IMPRESSION: Stable cardiomegaly.  Mild vascular congestion.   Electronically Signed   By: Rubye Oaks M.D.   On: 12/21/2014 00:19   Dg Chest Portable 1 View  12/29/2014   CLINICAL DATA:  Shortness of breath, AICD probable.  EXAM: PORTABLE CHEST - 1 VIEW  COMPARISON:  12/20/2014   FINDINGS: Left chest wall battery pack with incompletely imaged lead projecting over the expected location of the right ventricle the epicardial and right atrial lead positions are grossly similar. Cardiac enlargement. Central vascular congestion. Interstitial prominence. Retrocardiac opacity. Small effusions not excluded. No pneumothorax. Osteopenia. Multilevel degenerative change.  IMPRESSION: Enlarged cardiac silhouette and central vascular congestion.  Interstitial prominence may reflect pulmonary edema (favored) or atypical infection.   Electronically Signed   By: Jearld Lesch M.D.   On: 12/29/2014 02:59    ECG Irregular rhythm with significant ectopy  TTE 11/2014 Study Conclusions  - Procedure narrative: Transthoracic echocardiography. Image quality was suboptimal, with poor endocardial definition and valvular visualization. - Left ventricle: The cavity size was severely dilated. There was mild concentric hypertrophy. Systolic function was moderately to severely reduced. The estimated ejection fraction was in the range of 30% to 35%. Images were inadequate for LV wall motion assessment. Doppler parameters are consistent with a reversible restrictive pattern, indicative of decreased left ventricular diastolic compliance and/or increased left atrial pressure (grade 3 diastolic dysfunction). Doppler parameters are consistent with high ventricular filling pressure. E/A 2.9, medial E/e&' 18. lateral E/e&' 20.6. - Ventricular septum: Septal motion showed abnormal function and dyssynergy. - Mitral valve: Poorly visualized. Mildly calcified annulus. Mildly calcified leaflets . There was moderate regurgitation. - Left atrium: The atrium was massively dilated. Volume/bsa, ES (1-plane Simpson&'s, A4C): 87 ml/m^2. - Right ventricle: While velocity appears normal, the right ventricle  is suboptimally visualized and systolic function may be mildly reduced. It  appears mildly dilated. Pacer wire or catheter noted in right ventricle. Lateral annulus peak S velocity: 12.5 cm/s. - Right atrium: The atrium was mildly dilated. Pacer wire or catheter noted in right atrium. - Tricuspid valve: There was moderate regurgitation. - Pulmonary arteries: PA peak pressure: 60 mm Hg (S). Severely elevated pulmonary pressures. - Systemic veins: IVC is dilated with normal respiratory variation. Estimated CVP 8 mmHg.  Impressions:  - Would recommend a limited study with contrast for a more accurate determination of left ventricular systolic function and regional wall motion.  [facility] 12/12/13  RA 38/37 (35)  RV 90/28  PA 95/51 (71)  PCWP 57/73 (54)  LV 127/50  AO 123/66 (100)  Oxygen saturations:  PA 40%  AO 96%  Cardiac Output/Index (Fick) 3.2/1.4, PVR 5.3  Normal coronary anatomy  CPX 6/15 FVC 1.34 (42%)  FEV1 1.11 (43%)  FEV1/FVC 83%  MVV 42 (40%) RPE: 19 Resting HR: 80 Peak HR: 115 (67% age predicted max HR) BP rest: 102/70 BP peak: 118/68 Peak VO2: 9.3 (52.9% predicted peak VO2) VE/VCO2 slope: 29.3 OUES: 1.57 Peak RER: 0.95 Ventilatory Threshold: 7.7 (43.8% predicted peak VO2) Peak RR 44 Peak Ventilation: 32 VE/MVV: 76.2% PETCO2 at peak: 41 O2pulse: 10 (77% predicted O2pulse)   ICD Interrogation She has a BSCi [employer] device batter ok. She has had >30 recorded episodes including multiple episodes in the VF zone (240bpm). Full report is in her chart.  ASSESSMENT AND PLAN The patient is a 89F with ahistory of NICMP s/p CRT-D (BSCi), HTN, DM2, prior recurrent VT who presents with multiple ICD shocks. Given her recent medication change and severe electrolyte abnormalities, I am concerned that her symptoms are in the setting of her hypoK and hypoMg. In order to reduce vulnerable period I changed her settings on her ICD to overdrive pace her at 100bpm. Once her rhythm is more stable this will need to be adjusted back  to her lower rate limit of 60bpm. No other changes were made -Continue aspirin -serial biomarkers -Hold all diuretics -Aggressive repletion of K and Mg but limited by peripheral access. Will try to continue to push. She will likely need at least 6g Mg and at least K -Rehydrate with 1L NS -continue home amiodarone -consider full ICD interrogation tomorrow -Would ensure the advanced HF team sees her in AM; if her VT continues to worsen she may be a candidate for advanced therapies, but given what is a clear potential etiology for her symptoms would defer for now -SSI; held basal insulin given her NPO status for now -Versed/Fentanyl for discomfort from her ICD therapies -have held her CHF home regimen overnight pending resolution of her electrolyte abnormalities. She is currently NPO because she feels unable to take given the shocks. -TTE deferred given recent TTE on 11/30/2014  FULL CODE  Signed, Sammuel Hines, MD MPH 12/29/2014, 3:07 AM

## 2014-12-29 NOTE — Progress Notes (Signed)
eLink Physician-Brief Progress Note Patient Name: [name] DOB: 05-04-1966 MRN: [id number]   Date of Service  12/29/2014  HPI/Events of Note  Best Pracitce  eICU Interventions  DM - placed q4 hours SSI DVT propy - SCDs for now     Intervention Category Intermediate Interventions: Best-practice therapies (e.g. DVT, beta blocker, etc.)  DETERDING,ELIZABETH 12/29/2014, 2:06 AM

## 2014-12-30 DIAGNOSIS — I472 Ventricular tachycardia: Secondary | ICD-10-CM | POA: Insufficient documentation

## 2014-12-30 DIAGNOSIS — N179 Acute kidney failure, unspecified: Secondary | ICD-10-CM

## 2014-12-30 DIAGNOSIS — I4721 Torsades de pointes: Secondary | ICD-10-CM | POA: Insufficient documentation

## 2014-12-30 DIAGNOSIS — I70211 Atherosclerosis of native arteries of extremities with intermittent claudication, right leg: Secondary | ICD-10-CM

## 2014-12-30 LAB — BASIC METABOLIC PANEL
ANION GAP: 13 (ref 5–15)
BUN: 30 mg/dL — ABNORMAL HIGH (ref 6–23)
CALCIUM: 8.9 mg/dL (ref 8.4–10.5)
CHLORIDE: 86 mmol/L — AB (ref 96–112)
CO2: 37 mmol/L — ABNORMAL HIGH (ref 19–32)
Creatinine, Ser: 1.45 mg/dL — ABNORMAL HIGH (ref 0.50–1.10)
GFR, EST AFRICAN AMERICAN: 48 mL/min — AB (ref >90)
GFR, EST NON AFRICAN AMERICAN: 42 mL/min — AB (ref >90)
GLUCOSE: 164 mg/dL — AB (ref 70–99)
POTASSIUM: 3.4 mmol/L — AB (ref 3.5–5.1)
Sodium: 136 mmol/L (ref 135–145)

## 2014-12-30 LAB — GLUCOSE, CAPILLARY
GLUCOSE-CAPILLARY: 144 mg/dL — AB (ref 70–99)
GLUCOSE-CAPILLARY: 156 mg/dL — AB (ref 70–99)
GLUCOSE-CAPILLARY: 161 mg/dL — AB (ref 70–99)
GLUCOSE-CAPILLARY: 177 mg/dL — AB (ref 70–99)
GLUCOSE-CAPILLARY: 206 mg/dL — AB (ref 70–99)
Glucose-Capillary: 159 mg/dL — ABNORMAL HIGH (ref 70–99)
Glucose-Capillary: 188 mg/dL — ABNORMAL HIGH (ref 70–99)
Glucose-Capillary: 189 mg/dL — ABNORMAL HIGH (ref 70–99)

## 2014-12-30 LAB — DIGOXIN LEVEL: Digoxin Level: 1.1 ng/mL (ref 0.8–2.0)

## 2014-12-30 LAB — MAGNESIUM: Magnesium: 2 mg/dL (ref 1.5–2.5)

## 2014-12-30 MED ORDER — PROMETHAZINE HCL 25 MG/ML IJ SOLN
12.5000 mg | Freq: Four times a day (QID) | INTRAMUSCULAR | Status: DC | PRN
  Administered 2014-12-30 – 2014-12-31 (×2): 12.5 mg via INTRAVENOUS
  Filled 2014-12-30 (×2): qty 1

## 2014-12-30 MED ORDER — ISOSORB DINITRATE-HYDRALAZINE 20-37.5 MG PO TABS
0.5000 | ORAL_TABLET | Freq: Three times a day (TID) | ORAL | Status: DC
  Administered 2014-12-30 – 2015-01-01 (×6): 0.5 via ORAL
  Filled 2014-12-30 (×10): qty 0.5

## 2014-12-30 MED ORDER — ENOXAPARIN SODIUM 60 MG/0.6ML ~~LOC~~ SOLN
60.0000 mg | Freq: Every day | SUBCUTANEOUS | Status: DC
  Administered 2014-12-31: 60 mg via SUBCUTANEOUS
  Filled 2014-12-30 (×2): qty 0.6

## 2014-12-30 NOTE — Progress Notes (Signed)
Patient ID: [name], female   DOB: November 22, 1965, 49 y.o.   MRN: [id number]   SUBJECTIVE: No further VT, pacing rate set down to 80 yesterday.  Less chest pain, still sore with deep breath.  Weight stable.   Scheduled Meds: . amiodarone  200 mg Oral Daily  . aspirin EC  81 mg Oral Daily  . carvedilol  6.25 mg Oral BID WC  . enoxaparin (LOVENOX) injection  40 mg Subcutaneous Daily  . gabapentin  600 mg Oral TID AC & HS  . insulin aspart  0-15 Units Subcutaneous 6 times per day  . isosorbide-hydrALAZINE  0.5 tablet Oral TID  . magnesium oxide  400 mg Oral Daily  . pantoprazole  40 mg Oral Daily  . potassium chloride  10 mEq Intravenous Once  . potassium chloride  40 mEq Oral BID  . sodium chloride  3 mL Intravenous Q12H  . spironolactone  25 mg Oral BID   Continuous Infusions:  PRN Meds:.sodium chloride, acetaminophen, HYDROcodone-acetaminophen, sodium chloride    Filed Vitals:   12/29/14 1608 12/29/14 1610 12/29/14 2055 12/30/14 0432  BP:  102/71 112/75 113/70  Pulse: 80  80 80  Temp: 98.5 F (36.9 C)  98.2 F (36.8 C) 98.1 F (36.7 C)  TempSrc: Oral  Oral Oral  Resp:   18 18  Height:      Weight:    269 lb 6.4 oz (122.2 kg)  SpO2: 90% 95% 93% 91%    Intake/Output Summary (Last 24 hours) at 12/30/14 0839 Last data filed at 12/30/14 0433  Gross per 24 hour  Intake    290 ml  Output   1375 ml  Net  -1085 ml    LABS: Basic Metabolic Panel:  Recent Labs  40/98/11 0748 12/30/14 0512  NA 138 136  K 3.9 3.4*  CL 89* 86*  CO2 39* 37*  GLUCOSE 128* 164*  BUN 27* 30*  CREATININE 1.40* 1.45*  CALCIUM 9.3 8.9  MG 2.3 2.0   Liver Function Tests: No results for input(s): AST, ALT, ALKPHOS, BILITOT, PROT, ALBUMIN in the last 72 hours. No results for input(s): LIPASE, AMYLASE in the last 72 hours. CBC:  Recent Labs  12/28/14 2330 12/28/14 2350 12/29/14 0748  WBC 10.0  --  7.8  NEUTROABS 4.7  --   --   HGB 16.4* 18.7* 13.9  HCT 50.2* 55.0* 44.8  MCV  91.4  --  91.6  PLT PLATELET CLUMPS NOTED ON SMEAR, COUNT APPEARS ADEQUATE  --  272   Cardiac Enzymes:  Recent Labs  12/29/14 0748  TROPONINI 3.85*   BNP: Invalid input(s): POCBNP D-Dimer: No results for input(s): DDIMER in the last 72 hours. Hemoglobin A1C: No results for input(s): HGBA1C in the last 72 hours. Fasting Lipid Panel: No results for input(s): CHOL, HDL, LDLCALC, TRIG, CHOLHDL, LDLDIRECT in the last 72 hours. Thyroid Function Tests: No results for input(s): TSH, T4TOTAL, T3FREE, THYROIDAB in the last 72 hours.  Invalid input(s): FREET3 Anemia Panel: No results for input(s): VITAMINB12, FOLATE, FERRITIN, TIBC, IRON, RETICCTPCT in the last 72 hours.  RADIOLOGY: Dg Chest 2 View  12/21/2014   CLINICAL DATA:  Shortness of breath, chest discomfort for tonight.  EXAM: CHEST  2 VIEW  COMPARISON:  08/06/2014  FINDINGS: Multi lead left-sided pacemaker remains in place. Cardiomediastinal contours and cardiomegaly are unchanged. Limited assessment of the left lung base due to soft tissue attenuation. Mild vascular congestion. No pulmonary edema, confluent airspace disease, pleural effusion or pneumothorax.  No acute osseous abnormalities are seen.  IMPRESSION: Stable cardiomegaly.  Mild vascular congestion.   Electronically Signed   By: Rubye Oaks M.D.   On: 12/21/2014 00:19   Dg Chest Portable 1 View  12/29/2014   CLINICAL DATA:  Shortness of breath, AICD probable.  EXAM: PORTABLE CHEST - 1 VIEW  COMPARISON:  12/20/2014  FINDINGS: Left chest wall battery pack with incompletely imaged lead projecting over the expected location of the right ventricle the epicardial and right atrial lead positions are grossly similar. Cardiac enlargement. Central vascular congestion. Interstitial prominence. Retrocardiac opacity. Small effusions not excluded. No pneumothorax. Osteopenia. Multilevel degenerative change.  IMPRESSION: Enlarged cardiac silhouette and central vascular congestion.   Interstitial prominence may reflect pulmonary edema (favored) or atypical infection.   Electronically Signed   By: Jearld Lesch M.D.   On: 12/29/2014 02:59    PHYSICAL EXAM General: NAD Neck: Thick, JVP 8 cm, no thyromegaly or thyroid nodule.  Lungs: Clear to auscultation bilaterally with normal respiratory effort. CV: Nondisplaced PMI.  Heart regular S1/S2, no S3/S4, no murmur.  No peripheral edema.   Abdomen: Soft, nontender, no hepatosplenomegaly, no distention.  Neurologic: Alert and oriented x 3.  Psych: Normal affect. Extremities: No clubbing or cyanosis.   TELEMETRY: Reviewed telemetry pt paced at 100 bpm  ASSESSMENT AND PLAN: 49 yo with history of nonischemic cardiomyopathy was admitted after getting multiple ICD shocks for polymorphic VT in the setting of hypokalemia and hypomagnesemia (diuretics recently increased).  1. VT: Polymorphic VT likely due to hypokalemia and hypomagnesemia in setting of increased diuretics.  She is going to need effective diuresis so will have to watch K and Mg closely.   - K mildly low, will supplement now.   - Started supplemental magnesium.  - Increase home KCl to 40 bid on days she is not taking metolazone and 80 bid if she takes metolazone. Will likely decrease metolazone from twice a week to once a week.  - Increased spironolactone to bid - Continue Coreg and amiodarone.  - No driving x 6 months.  2. Chronic Systolic Heart Failure: Nonischemic cardiomyopathy, s/p [employer] CRT-D. EF 30-35% (11/2014). CPX in 6/15 showed mild cardiac limitation but main issue seemed to be severe ventilatory limitation due to obesity and restrictive physiology. NYHA class IIIb symptoms recently.  Metolazone was added to regimen at last appointment, weight down a lot but with low K and Mg, had polymorphic VT.  Creatinine up from baseline.  - Back on Coreg, will restart Bidil 1/2 tab tid today.  - As above, increased spironolactone to 25 mg bid - Would  decrease losartan back to 25 mg bid. Creatinine still above baseline so probably start tomorrow.  - Weight stable and K a bit low.  Will wait until tomorrow to start back on torsemide, hold metolazone for now.  May need once a week to keep volume status ideal.   - Digoxin level 1.1, will start back on digoxin at lower dose (0.0625 daily) tomorrow.  3. OSA: Using CPAP.  4. Morbid Obesity: She is going to consider bariatric surgery eventually.  5. Leg weakness: Difficult to palpate pedal pulses. Wants to do peripheral arterial doppler evaluation while inpatient, I have ordered. 6. AKI: Creatinine up to 1.75 with increased diuresis.  As above, will back down on metolazone to probably once a week and decrease losartan back to 25 bid. Creatinine down to 1.4 today, losartan not started yet.  7. Elevated troponin: Suspect this is due to multiple  shocks.  8. Ambulate hall today.  Will reassess tomorrow, possible d/c home tomorrow.   [name] 12/30/2014 8:39 AM

## 2014-12-30 NOTE — Progress Notes (Signed)
VASCULAR LAB PRELIMINARY  ARTERIAL  ABI completed:  ABIs and pedal waveforms within normal limits at rest.    RIGHT    LEFT    PRESSURE WAVEFORM  PRESSURE WAVEFORM  BRACHIAL 117 Triphasic  BRACHIAL 124 Triphasic   DP 112 Triphasic  DP 114 Triphasic   AT   AT    PT 103 Triphasic  PT 95  Triphasic   PER   PER    GREAT TOE  NA GREAT TOE  NA    RIGHT LEFT  ABI 0.90  wnl 0.92  wnl     [name], RVT 12/30/2014, 1:40 PM

## 2014-12-30 NOTE — Clinical Documentation Improvement (Signed)
  Patient known to have DM2.  "Diabetes without complications" documented in the current medical record.  Patient on Neurontin prior to admission with a documented diagnosis of "Peripheral Neuropathy".  If appropriate and able to determine, please document any Diabetic Associated Conditions/Manifestations in the progress notes and discharge summary.   Thank You, Jerral Ralph ,RN Clinical Documentation Specialist:  [phone] [facility]- Health Information Management

## 2014-12-30 NOTE — Progress Notes (Signed)
Pt with no further ventricular arrhythmias Device interrogation reviewed today - will decrease base pacing rate to 70  Diuretic management per AHF service Follow up with me in the office 01-18-15 for device re-evaluation. No driving x6 months (pt aware)  Gypsy Balsam, NP 12/30/2014 9:52 AM  Hillis Range MD, [facility] 12/30/2014 7:16 PM

## 2014-12-30 NOTE — Progress Notes (Signed)
Pt states she will place herself on cpap when ready for bed, water in chamber and settings correct. Encouraged pt to call if she needs any assistance

## 2014-12-30 NOTE — Progress Notes (Signed)
Patient ambulated in hallway 150 feet; standby assist.  Baylon Santelli, Geroge Baseman, RN

## 2014-12-31 LAB — GLUCOSE, CAPILLARY
GLUCOSE-CAPILLARY: 109 mg/dL — AB (ref 70–99)
GLUCOSE-CAPILLARY: 153 mg/dL — AB (ref 70–99)
GLUCOSE-CAPILLARY: 164 mg/dL — AB (ref 70–99)
GLUCOSE-CAPILLARY: 176 mg/dL — AB (ref 70–99)
GLUCOSE-CAPILLARY: 214 mg/dL — AB (ref 70–99)
Glucose-Capillary: 175 mg/dL — ABNORMAL HIGH (ref 70–99)

## 2014-12-31 LAB — BASIC METABOLIC PANEL
Anion gap: 11 (ref 5–15)
BUN: 28 mg/dL — ABNORMAL HIGH (ref 6–23)
CHLORIDE: 89 mmol/L — AB (ref 96–112)
CO2: 35 mmol/L — ABNORMAL HIGH (ref 19–32)
Calcium: 8.8 mg/dL (ref 8.4–10.5)
Creatinine, Ser: 1.2 mg/dL — ABNORMAL HIGH (ref 0.50–1.10)
GFR calc non Af Amer: 53 mL/min — ABNORMAL LOW (ref >90)
GFR, EST AFRICAN AMERICAN: 61 mL/min — AB (ref >90)
Glucose, Bld: 165 mg/dL — ABNORMAL HIGH (ref 70–99)
POTASSIUM: 3.6 mmol/L (ref 3.5–5.1)
Sodium: 135 mmol/L (ref 135–145)

## 2014-12-31 LAB — MAGNESIUM: Magnesium: 2.2 mg/dL (ref 1.5–2.5)

## 2014-12-31 MED ORDER — DIGOXIN 0.0625 MG HALF TABLET
0.0625 mg | ORAL_TABLET | Freq: Every day | ORAL | Status: DC
  Administered 2014-12-31 – 2015-01-01 (×2): 0.0625 mg via ORAL
  Filled 2014-12-31 (×2): qty 1

## 2014-12-31 MED ORDER — IVABRADINE HCL 5 MG PO TABS
2.5000 mg | ORAL_TABLET | Freq: Two times a day (BID) | ORAL | Status: DC
  Administered 2014-12-31 – 2015-01-01 (×3): 2.5 mg via ORAL
  Filled 2014-12-31 (×6): qty 1

## 2014-12-31 MED ORDER — POTASSIUM CHLORIDE CRYS ER 20 MEQ PO TBCR
60.0000 meq | EXTENDED_RELEASE_TABLET | Freq: Two times a day (BID) | ORAL | Status: DC
  Administered 2014-12-31 – 2015-01-01 (×3): 60 meq via ORAL
  Filled 2014-12-31 (×3): qty 3

## 2014-12-31 MED ORDER — LOSARTAN POTASSIUM 25 MG PO TABS
25.0000 mg | ORAL_TABLET | Freq: Two times a day (BID) | ORAL | Status: DC
  Administered 2014-12-31 – 2015-01-01 (×3): 25 mg via ORAL
  Filled 2014-12-31 (×7): qty 1

## 2014-12-31 MED ORDER — DOCUSATE SODIUM 100 MG PO CAPS
100.0000 mg | ORAL_CAPSULE | Freq: Every day | ORAL | Status: DC | PRN
  Administered 2014-12-31 – 2015-01-01 (×2): 100 mg via ORAL
  Filled 2014-12-31 (×2): qty 1

## 2014-12-31 MED ORDER — TORSEMIDE 20 MG PO TABS
80.0000 mg | ORAL_TABLET | Freq: Two times a day (BID) | ORAL | Status: DC
  Administered 2014-12-31 – 2015-01-01 (×3): 80 mg via ORAL
  Filled 2014-12-31 (×6): qty 4

## 2014-12-31 MED ORDER — POLYETHYLENE GLYCOL 3350 17 G PO PACK
17.0000 g | PACK | Freq: Two times a day (BID) | ORAL | Status: DC | PRN
  Administered 2014-12-31: 17 g via ORAL
  Filled 2014-12-31 (×2): qty 1

## 2014-12-31 NOTE — Consult Note (Signed)
   [facility] CM Inpatient Consult   12/31/2014  [name] June 09, 1966 [id number]   Came to visit patient to offer [place] Management services. She had been active in the recent past. However, she felt that her needs had been met at that time. During bedside visit today she states she could benefit from [place] [facility] follow up again for CHF disease management and education. Consents signed. Explained to her that she will receive post hospital discharge call and will be evaluated for monthly home visits. Left [facility] Care Management packet and contact information at bedside. Appreciative of visit. Will make inpatient RNCM aware [facility] will follow.   Marthenia Rolling, MSN-Ed, RN,BSN [facility] Liaison [phone]

## 2014-12-31 NOTE — Progress Notes (Signed)
Patient ID: [name], female   DOB: June 18, 1966, 49 y.o.   MRN: [id number]   SUBJECTIVE: No further VT, basal pacing rate down to 70, now in sinus rhythm with v-pacing.  Less chest pain, still sore with deep breath.  Walking with mild dyspnea.  Weight up 4 lbs.    Scheduled Meds: . amiodarone  200 mg Oral Daily  . aspirin EC  81 mg Oral Daily  . carvedilol  6.25 mg Oral BID WC  . enoxaparin (LOVENOX) injection  60 mg Subcutaneous Daily  . gabapentin  600 mg Oral TID AC & HS  . insulin aspart  0-15 Units Subcutaneous 6 times per day  . isosorbide-hydrALAZINE  0.5 tablet Oral TID  . ivabradine  2.5 mg Oral BID WC  . losartan  25 mg Oral BID  . magnesium oxide  400 mg Oral Daily  . pantoprazole  40 mg Oral Daily  . potassium chloride  10 mEq Intravenous Once  . potassium chloride  60 mEq Oral BID  . sodium chloride  3 mL Intravenous Q12H  . spironolactone  25 mg Oral BID  . torsemide  80 mg Oral BID   Continuous Infusions:  PRN Meds:.sodium chloride, acetaminophen, HYDROcodone-acetaminophen, promethazine, sodium chloride    Filed Vitals:   12/30/14 0432 12/30/14 1412 12/30/14 2009 12/31/14 0421  BP: 113/70 106/69 116/57 114/78  Pulse: 80 73 70 70  Temp: 98.1 F (36.7 C) 98.3 F (36.8 C) 98.2 F (36.8 C) 97.8 F (36.6 C)  TempSrc: Oral Oral Oral Oral  Resp: Height:      Weight: 269 lb 6.4 oz (122.2 kg)   273 lb 2.4 oz (123.9 kg)  SpO2: 91% 94% 95% 97%    Intake/Output Summary (Last 24 hours) at 12/31/14 0743 Last data filed at 12/31/14 0400  Gross per 24 hour  Intake    480 ml  Output   1600 ml  Net  -1120 ml    LABS: Basic Metabolic Panel:  Recent Labs  40/98/11 0512 12/31/14 0457  NA 136 135  K 3.4* 3.6  CL 86* 89*  CO2 37* 35*  GLUCOSE 164* 165*  BUN 30* 28*  CREATININE 1.45* 1.20*  CALCIUM 8.9 8.8  MG 2.0 2.2   Liver Function Tests: No results for input(s): AST, ALT, ALKPHOS, BILITOT, PROT, ALBUMIN in the last 72 hours. No  results for input(s): LIPASE, AMYLASE in the last 72 hours. CBC:  Recent Labs  12/28/14 2330 12/28/14 2350 12/29/14 0748  WBC 10.0  --  7.8  NEUTROABS 4.7  --   --   HGB 16.4* 18.7* 13.9  HCT 50.2* 55.0* 44.8  MCV 91.4  --  91.6  PLT PLATELET CLUMPS NOTED ON SMEAR, COUNT APPEARS ADEQUATE  --  272   Cardiac Enzymes:  Recent Labs  12/29/14 0748  TROPONINI 3.85*   BNP: Invalid input(s): POCBNP D-Dimer: No results for input(s): DDIMER in the last 72 hours. Hemoglobin A1C: No results for input(s): HGBA1C in the last 72 hours. Fasting Lipid Panel: No results for input(s): CHOL, HDL, LDLCALC, TRIG, CHOLHDL, LDLDIRECT in the last 72 hours. Thyroid Function Tests: No results for input(s): TSH, T4TOTAL, T3FREE, THYROIDAB in the last 72 hours.  Invalid input(s): FREET3 Anemia Panel: No results for input(s): VITAMINB12, FOLATE, FERRITIN, TIBC, IRON, RETICCTPCT in the last 72 hours.  RADIOLOGY: Dg Chest 2 View  12/21/2014   CLINICAL DATA:  Shortness of breath, chest discomfort for tonight.  EXAM: CHEST  2 VIEW  COMPARISON:  08/06/2014  FINDINGS: Multi lead left-sided pacemaker remains in place. Cardiomediastinal contours and cardiomegaly are unchanged. Limited assessment of the left lung base due to soft tissue attenuation. Mild vascular congestion. No pulmonary edema, confluent airspace disease, pleural effusion or pneumothorax. No acute osseous abnormalities are seen.  IMPRESSION: Stable cardiomegaly.  Mild vascular congestion.   Electronically Signed   By: Rubye Oaks M.D.   On: 12/21/2014 00:19   Dg Chest Portable 1 View  12/29/2014   CLINICAL DATA:  Shortness of breath, AICD probable.  EXAM: PORTABLE CHEST - 1 VIEW  COMPARISON:  12/20/2014  FINDINGS: Left chest wall battery pack with incompletely imaged lead projecting over the expected location of the right ventricle the epicardial and right atrial lead positions are grossly similar. Cardiac enlargement. Central vascular  congestion. Interstitial prominence. Retrocardiac opacity. Small effusions not excluded. No pneumothorax. Osteopenia. Multilevel degenerative change.  IMPRESSION: Enlarged cardiac silhouette and central vascular congestion.  Interstitial prominence may reflect pulmonary edema (favored) or atypical infection.   Electronically Signed   By: Jearld Lesch M.D.   On: 12/29/2014 02:59    PHYSICAL EXAM General: NAD Neck: Thick, JVP 8 cm, no thyromegaly or thyroid nodule.  Lungs: Clear to auscultation bilaterally with normal respiratory effort. CV: Nondisplaced PMI.  Heart regular S1/S2, no S3/S4, no murmur.  No peripheral edema.   Abdomen: Soft, nontender, no hepatosplenomegaly, no distention.  Neurologic: Alert and oriented x 3.  Psych: Normal affect. Extremities: No clubbing or cyanosis.   TELEMETRY: Reviewed telemetry pt sinus with BiV-pacing  ASSESSMENT AND PLAN: 49 yo with history of nonischemic cardiomyopathy was admitted after getting multiple ICD shocks for polymorphic VT in the setting of hypokalemia and hypomagnesemia (diuretics recently increased).  1. VT: Polymorphic VT likely due to hypokalemia and hypomagnesemia in setting of increased diuretics.  She is going to need effective diuresis so will have to watch K and Mg closely.   - I will restart torsemide 80 mg bid today and increased KCl to 60 bid.  - Started supplemental magnesium.  - Will likely decrease metolazone from twice a week to once a week, she will need an extra 60 mEq KCl on metolazone days.   - Increased spironolactone to bid - Continue Coreg and amiodarone.  - Restart losartan 25 mg bid and restart ivabradine.  - No driving x 6 months.  2. Chronic Systolic Heart Failure: Nonischemic cardiomyopathy, s/p [employer] CRT-D. EF 30-35% (11/2014). CPX in 6/15 showed mild cardiac limitation but main issue seemed to be severe ventilatory limitation due to obesity and restrictive physiology. NYHA class IIIb symptoms  recently.  Metolazone was added to regimen at last appointment, weight down a lot but with low K and Mg, had polymorphic VT.  Creatinine up from baseline.  - Back on Coreg, will restart Bidil 1/2 tab tid today.  - As above, increased spironolactone to 25 mg bid - Would decrease losartan back to 25 mg bid. Creatinine still above baseline so probably start tomorrow.  - Weight stable and K a bit low.  Will wait until tomorrow to start back on torsemide, hold metolazone for now.  May need once a week to keep volume status ideal.   - Digoxin level 1.1, will start back on digoxin at lower dose (0.0625 daily) today.   3. OSA: Using CPAP.  4. Morbid Obesity: She is going to consider bariatric surgery eventually.  5. Leg weakness: Difficult to palpate pedal pulses but ABIs this admission normal.  6. AKI: Creatinine up to 1.75 with increased diuresis.  As above, will back down on metolazone to probably once a week and decrease losartan back to 25 bid. Creatinine down to 1.2 today, restarting losartan (at lower dose), torsemide, and digoxin (at lower dose).  7. Elevated troponin: Suspect this is due to multiple shocks.  8. Disposition: Watch on restarted diuretics and other home meds today, plan home tomorrow.   [name] 12/31/2014 7:43 AM

## 2014-12-31 NOTE — Progress Notes (Signed)
Received report from [facility]. Pt is alert and oriented x4, VSS. Will continue to monitor closely.

## 2014-12-31 NOTE — Progress Notes (Signed)
Medicare Important Message given? YES  (If response is "NO", the following Medicare IM given date fields will be blank)  Date Medicare IM given: 12/31/14 Medicare IM given by:  Colletta Spillers  

## 2014-12-31 NOTE — Progress Notes (Signed)
12/31/2014 11:25 PM The patient has been complaining of constipation and said she has not had a real good bowel movement in days.  Dr. Debbora Lacrosse was paged and he said to order her 17 grams of Miralax to take by mouth to be given twice a day as needed for constipation.  Will continue to monitor the patient. Harriet Masson

## 2014-12-31 NOTE — Progress Notes (Addendum)
PA paged per pt request for medication for constipation. [name] 11:47 AM   MD paged. 11:50 AM

## 2015-01-01 LAB — GLUCOSE, CAPILLARY
Glucose-Capillary: 144 mg/dL — ABNORMAL HIGH (ref 70–99)
Glucose-Capillary: 199 mg/dL — ABNORMAL HIGH (ref 70–99)

## 2015-01-01 LAB — BASIC METABOLIC PANEL
ANION GAP: 10 (ref 5–15)
BUN: 24 mg/dL — ABNORMAL HIGH (ref 6–23)
CALCIUM: 8.2 mg/dL — AB (ref 8.4–10.5)
CO2: 34 mmol/L — ABNORMAL HIGH (ref 19–32)
CREATININE: 1.22 mg/dL — AB (ref 0.50–1.10)
Chloride: 92 mmol/L — ABNORMAL LOW (ref 96–112)
GFR calc Af Amer: 60 mL/min — ABNORMAL LOW (ref >90)
GFR, EST NON AFRICAN AMERICAN: 52 mL/min — AB (ref >90)
Glucose, Bld: 146 mg/dL — ABNORMAL HIGH (ref 70–99)
Potassium: 3.9 mmol/L (ref 3.5–5.1)
SODIUM: 136 mmol/L (ref 135–145)

## 2015-01-01 LAB — AMIODARONE LEVEL
Amiodarone Lvl: 2.1 ug/mL (ref 1.0–2.5)
N-Desethyl-Amiodarone: 1.1 ug/mL (ref 1.0–2.5)

## 2015-01-01 LAB — MAGNESIUM: Magnesium: 2 mg/dL (ref 1.5–2.5)

## 2015-01-01 MED ORDER — MAGNESIUM OXIDE 400 (241.3 MG) MG PO TABS: 400.0000 mg | ORAL_TABLET | Freq: Every day | ORAL | Status: DC

## 2015-01-01 MED ORDER — DIGOXIN 125 MCG PO TABS: 0.0625 mg | ORAL_TABLET | Freq: Every day | ORAL | Status: DC

## 2015-01-01 MED ORDER — POTASSIUM CHLORIDE CRYS ER 20 MEQ PO TBCR: 60.0000 meq | EXTENDED_RELEASE_TABLET | Freq: Two times a day (BID) | ORAL | Status: DC

## 2015-01-01 MED ORDER — AMIODARONE HCL 200 MG PO TABS: 200.0000 mg | ORAL_TABLET | Freq: Every day | ORAL | Status: DC

## 2015-01-01 MED ORDER — LOSARTAN POTASSIUM 50 MG PO TABS: 25.0000 mg | ORAL_TABLET | Freq: Two times a day (BID) | ORAL | Status: DC

## 2015-01-01 MED ORDER — METOLAZONE 2.5 MG PO TABS: 2.5000 mg | ORAL_TABLET | ORAL | Status: DC

## 2015-01-01 MED ORDER — POTASSIUM CHLORIDE CRYS ER 20 MEQ PO TBCR: 60.0000 meq | EXTENDED_RELEASE_TABLET | Freq: Once | ORAL | Status: DC

## 2015-01-01 MED ORDER — SPIRONOLACTONE 25 MG PO TABS: 25.0000 mg | ORAL_TABLET | Freq: Two times a day (BID) | ORAL | Status: DC

## 2015-01-01 NOTE — Progress Notes (Signed)
01/01/2015 12:00 PM Discharge AVS meds taken today and those due this evening reviewed.  Follow-up appointments and when to call md reviewed.  D/C IV and TELE.  Questions and concerns addressed.   D/C home per orders. Kathryne Hitch

## 2015-01-01 NOTE — Progress Notes (Signed)
Medicare Important Message given? YES  (If response is "NO", the following Medicare IM given date fields will be blank)  Date Medicare IM given: 01/01/15 Medicare IM given by:  Rory Xiang  

## 2015-01-01 NOTE — Discharge Summary (Signed)
Advanced Heart Failure Team  Discharge Summary   Patient ID: [name] MRN: [id number], DOB/AGE: 06/06/1966 49 y.o. Admit date: 12/28/2014 D/C date:     01/01/2015   Primary Discharge Diagnoses:  1.  VT: Polymorphic VT likely due to hypokalemia and hypomagnesemia in setting of increased diuretics. 2. Chronic Systolic Heart Failure: Nonischemic cardiomyopathy, s/p [employer] CRT-D. 3. OSA: Using CPAP.  4. Morbid Obesity: BMI 44. She is going to consider bariatric surgery eventually.  5. Leg weakness: Difficult to palpate pedal pulses but ABIs this admission normal. 6. AKI- Creatinine 1.7>1.2  Hospital Course:  49 yo with history of nonischemic cardiomyopathy was admitted after getting multiple ICD shocks for polymorphic VT in the setting of hypokalemia and hypomagnesemia (diuretics recently increased).   1. VT: Polymorphic VT likely due to hypokalemia and hypomagnesemia in setting of increased diuretics. EP consulted. ICD functioning appropriately.  K and Magnesium repleated during hospitalization. She will continue amiodarone 200 mg daily and coreg. Home potassium increased and Mag Ox added.  Also spironolactone was increased to 25 mg twice a day.  - She is unable to drive 6 months and she is aware.   2. Chronic Systolic Heart Failure: Nonischemic cardiomyopathy, s/p [employer] CRT-D. EF 30-35% (11/2014). CPX in 6/15 showed mild cardiac limitation but main issue seemed to be severe ventilatory limitation due to obesity and restrictive physiology. NYHA class IIIb symptoms. She will continue coreg and bidil.    -- As above, increased spironolactone to 25 mg bid - Continue losartan at reduced rate losartan back to 25 mg bid given rise in creatinine.  - Digoxin level 1.1, digoxin restarted at lower dose (0.0625 daily).Check dig at follow up next week.  3. OSA: Using CPAP. Continue at home  4. Morbid Obesity: needs to lose weight. Considering bariatric surgery.  5.  Leg weakness: Difficult to palpate pedal pulses but ABIs this admission normal. 6. AKI: Creatinine up to 1.75 with increased diuresis. As above, will back down on metolazone to once weekly and  decrease losartan back to 25 bid. Creatinine down to 1.2 today.  Repeat BMET at follow up.  7. Elevated troponin: Suspect this is due to multiple shocks.   She was evaluated by Dr [name] and deemed stable for discharge home. She will follow up in the HF clinic April 29th.   Discharge Weight Range: 271 pounds.  Discharge Vitals: Blood pressure 120/73, pulse 70, temperature 97.8 F (36.6 C), temperature source Oral, resp. rate 18, height  (1.651 m), weight 271 lb 13.2 oz (123.3 kg), last menstrual period 06/21/2012, SpO2 96 %.  Labs: Lab Results  Component Value Date   WBC 7.8 12/29/2014   HGB 13.9 12/29/2014   HCT 44.8 12/29/2014   MCV 91.6 12/29/2014   PLT 272 12/29/2014    Recent Labs Lab 01/01/15 0617  NA 136  K 3.9  CL 92*  CO2 34*  BUN 24*  CREATININE 1.22*  CALCIUM 8.2*  GLUCOSE 146*   Lab Results  Component Value Date   CHOL 195 06/21/2012   HDL 45 06/21/2012   LDLCALC 103* 06/21/2012   TRIG 233* 06/21/2012   BNP (last 3 results)  Recent Labs  12/20/14 2212  BNP 235.5*    ProBNP (last 3 results)  Recent Labs  08/04/14 2339 08/21/14 1117  PROBNP 1727.0* 171.9*     Diagnostic Studies/Procedures   No results found.  Discharge Medications     Medication List    TAKE these medications  albuterol 108 (90 BASE) MCG/ACT inhaler  Commonly known as:  PROVENTIL HFA;VENTOLIN HFA  Inhale 2 puffs into the lungs every 6 (six) hours as needed for wheezing or shortness of breath.     albuterol (5 MG/ML) 0.5% nebulizer solution  Commonly known as:  PROVENTIL  Take 0.5 mLs (2.5 mg total) by nebulization every 4 (four) hours as needed for wheezing or shortness of breath.     allopurinol 300 MG tablet  Commonly known as:  ZYLOPRIM  Take 1 tablet (300  mg total) by mouth daily.     amiodarone 200 MG tablet  Commonly known as:  PACERONE  Take 1 tablet (200 mg total) by mouth daily.     aspirin 81 MG EC tablet  Take 1 tablet (81 mg total) by mouth daily.     carvedilol 6.25 MG tablet  Commonly known as:  COREG  Take 1 tablet (6.25 mg total) by mouth 2 (two) times daily with a meal.     cetirizine 10 MG tablet  Commonly known as:  ZYRTEC  Take 10 mg by mouth daily as needed for allergies.     cyclobenzaprine 10 MG tablet  Commonly known as:  FLEXERIL  Take 10 mg by mouth 3 (three) times daily as needed for muscle spasms.     digoxin 0.125 MG tablet  Commonly known as:  LANOXIN  Take 0.5 tablets (0.0625 mg total) by mouth daily.     ferrous sulfate 325 (65 FE) MG tablet  Take 325 mg by mouth 3 (three) times daily.     gabapentin 300 MG capsule  Commonly known as:  NEURONTIN  Take 600 mg by mouth 4 (four) times daily.     HYDROcodone-acetaminophen 10-325 MG per tablet  Commonly known as:  NORCO  Take 1 tablet by mouth every 4 (four) hours as needed for moderate pain.     insulin aspart 100 UNIT/ML injection  Commonly known as:  novoLOG  Inject 0-20 Units into the skin 3 (three) times daily with meals.     insulin detemir 100 UNIT/ML injection  Commonly known as:  LEVEMIR  Inject 100 Units into the skin at bedtime.     isosorbide-hydrALAZINE 20-37.5 MG per tablet  Commonly known as:  BIDIL  Take 0.5 tablets by mouth 3 (three) times daily.     ivabradine 5 MG Tabs tablet  Commonly known as:  CORLANOR  Take 0.5 tablets (2.5 mg total) by mouth 2 (two) times daily with a meal.     losartan 50 MG tablet  Commonly known as:  COZAAR  Take 0.5 tablets (25 mg total) by mouth 2 (two) times daily.     magnesium oxide 400 (241.3 MG) MG tablet  Commonly known as:  MAG-OX  Take 1 tablet (400 mg total) by mouth daily.     metolazone 2.5 MG tablet  Commonly known as:  ZAROXOLYN  Take 1 tablet (2.5 mg total) by mouth once a  week. Take one tablet by mouth 30 minutes before taking morning dose of Torsemide on MONDAYS     omeprazole 40 MG capsule  Commonly known as:  PRILOSEC  Take 1 capsule (40 mg total) by mouth daily.     potassium chloride SA 20 MEQ tablet  Commonly known as:  K-DUR,KLOR-CON  Take 3 tablets (60 mEq total) by mouth 2 (two) times daily. Take an extra 60 meq of potassium on Metolazone days.     spironolactone 25 MG tablet  Commonly known as:  ALDACTONE  Take 1 tablet (25 mg total) by mouth 2 (two) times daily.     temazepam 30 MG capsule  Commonly known as:  RESTORIL  Take 30 mg by mouth at bedtime.     torsemide 20 MG tablet  Commonly known as:  DEMADEX  Take 4 tablets (80 mg total) by mouth 2 (two) times daily.     vitamin E 400 UNIT capsule  Take 400 Units by mouth daily.        Disposition   The patient will be discharged in stable condition to home.     Discharge Instructions    ACE Inhibitor / ARB already ordered    Complete by:  As directed      AMB Referral to [facility] Care Management    Complete by:  As directed   Please assign to [facility] RNCM for disease management and education and reinforcement for CHF. Consents signed. 3 admits in past 23months. Please call with questions. Likely dc home on 01/01/15 Raiford Noble, MSN-Ed, [facility] Liaison-[phone]  Reason for consult:  Please assign to [facility] RNCM  Diagnoses of:  Heart Failure  Expected date of contact:  1-3 days (reserved for hospital discharges)     Diet - low sodium heart healthy    Complete by:  As directed      Heart Failure patients record your daily weight using the same scale at the same time of day    Complete by:  As directed      Increase activity slowly    Complete by:  As directed           Follow-up Information    Follow up with Marily Lente, NP On 01/18/2015.   Specialty:  Nurse Practitioner   Why:  at [facility] information:   [address] [place]  [address] [phone]       Follow up with [name], MD On 01/08/2015.   Specialty:  Cardiology   Why:  11:40 Garage Code [id number]   Contact information:   [address]  [address] [place] [address] [phone]         Duration of Discharge Encounter: Greater than 35 minutes   Signed, CLEGG,[name] NP-C   01/01/2015, 8:21 AM

## 2015-01-01 NOTE — Progress Notes (Addendum)
Patient ID: [name], female   DOB: Sep 02, 1966, 49 y.o.   MRN: [id number]   SUBJECTIVE: No further VT.  Less chest pain, still mildly sore with deep breath.  Walking with mild dyspnea.  Torsemide restarted yesterday, weight down 2 lbs.   Scheduled Meds: . amiodarone  200 mg Oral Daily  . aspirin EC  81 mg Oral Daily  . carvedilol  6.25 mg Oral BID WC  . digoxin  0.0625 mg Oral Daily  . enoxaparin (LOVENOX) injection  60 mg Subcutaneous Daily  . gabapentin  600 mg Oral TID AC & HS  . insulin aspart  0-15 Units Subcutaneous 6 times per day  . isosorbide-hydrALAZINE  0.5 tablet Oral TID  . ivabradine  2.5 mg Oral BID WC  . losartan  25 mg Oral BID  . magnesium oxide  400 mg Oral Daily  . pantoprazole  40 mg Oral Daily  . potassium chloride  10 mEq Intravenous Once  . potassium chloride  60 mEq Oral BID  . sodium chloride  3 mL Intravenous Q12H  . spironolactone  25 mg Oral BID  . torsemide  80 mg Oral BID   Continuous Infusions:  PRN Meds:.sodium chloride, acetaminophen, docusate sodium, HYDROcodone-acetaminophen, polyethylene glycol, promethazine, sodium chloride    Filed Vitals:   12/31/14 1842 12/31/14 2026 12/31/14 2233 01/01/15 0338  BP: 106/71 101/60 103/55 120/73  Pulse: 67 70 69 70  Temp:  97.7 F (36.5 C)  97.8 F (36.6 C)  TempSrc:  Oral  Oral  Resp: Height:      Weight:    271 lb 13.2 oz (123.3 kg)  SpO2: 97% 96%  96%    Intake/Output Summary (Last 24 hours) at 01/01/15 0734 Last data filed at 01/01/15 0130  Gross per 24 hour  Intake   1142 ml  Output    800 ml  Net    342 ml    LABS: Basic Metabolic Panel:  Recent Labs  40/98/11 0457 01/01/15 0617  NA 135 136  K 3.6 3.9  CL 89* 92*  CO2 35* 34*  GLUCOSE 165* 146*  BUN 28* 24*  CREATININE 1.20* 1.22*  CALCIUM 8.8 8.2*  MG 2.2 2.0   Liver Function Tests: No results for input(s): AST, ALT, ALKPHOS, BILITOT, PROT, ALBUMIN in the last 72 hours. No results for input(s): LIPASE,  AMYLASE in the last 72 hours. CBC:  Recent Labs  12/29/14 0748  WBC 7.8  HGB 13.9  HCT 44.8  MCV 91.6  PLT 272   Cardiac Enzymes:  Recent Labs  12/29/14 0748  TROPONINI 3.85*   BNP: Invalid input(s): POCBNP D-Dimer: No results for input(s): DDIMER in the last 72 hours. Hemoglobin A1C: No results for input(s): HGBA1C in the last 72 hours. Fasting Lipid Panel: No results for input(s): CHOL, HDL, LDLCALC, TRIG, CHOLHDL, LDLDIRECT in the last 72 hours. Thyroid Function Tests: No results for input(s): TSH, T4TOTAL, T3FREE, THYROIDAB in the last 72 hours.  Invalid input(s): FREET3 Anemia Panel: No results for input(s): VITAMINB12, FOLATE, FERRITIN, TIBC, IRON, RETICCTPCT in the last 72 hours.  RADIOLOGY: Dg Chest 2 View  12/21/2014   CLINICAL DATA:  Shortness of breath, chest discomfort for tonight.  EXAM: CHEST  2 VIEW  COMPARISON:  08/06/2014  FINDINGS: Multi lead left-sided pacemaker remains in place. Cardiomediastinal contours and cardiomegaly are unchanged. Limited assessment of the left lung base due to soft tissue attenuation. Mild vascular congestion. No pulmonary edema, confluent airspace  disease, pleural effusion or pneumothorax. No acute osseous abnormalities are seen.  IMPRESSION: Stable cardiomegaly.  Mild vascular congestion.   Electronically Signed   By: Rubye Oaks M.D.   On: 12/21/2014 00:19   Dg Chest Portable 1 View  12/29/2014   CLINICAL DATA:  Shortness of breath, AICD probable.  EXAM: PORTABLE CHEST - 1 VIEW  COMPARISON:  12/20/2014  FINDINGS: Left chest wall battery pack with incompletely imaged lead projecting over the expected location of the right ventricle the epicardial and right atrial lead positions are grossly similar. Cardiac enlargement. Central vascular congestion. Interstitial prominence. Retrocardiac opacity. Small effusions not excluded. No pneumothorax. Osteopenia. Multilevel degenerative change.  IMPRESSION: Enlarged cardiac silhouette and  central vascular congestion.  Interstitial prominence may reflect pulmonary edema (favored) or atypical infection.   Electronically Signed   By: Jearld Lesch M.D.   On: 12/29/2014 02:59    PHYSICAL EXAM General: NAD Neck: Thick, JVP 8 cm, no thyromegaly or thyroid nodule.  Lungs: Clear to auscultation bilaterally with normal respiratory effort. CV: Nondisplaced PMI.  Heart regular S1/S2, no S3/S4, no murmur.  No peripheral edema.   Abdomen: Soft, nontender, no hepatosplenomegaly, no distention.  Neurologic: Alert and oriented x 3.  Psych: Normal affect. Extremities: No clubbing or cyanosis.   TELEMETRY: Reviewed telemetry pt sinus with BiV-pacing  ASSESSMENT AND PLAN: 49 yo with history of nonischemic cardiomyopathy was admitted after getting multiple ICD shocks for polymorphic VT in the setting of hypokalemia and hypomagnesemia (diuretics recently increased).  1. VT: Polymorphic VT likely due to hypokalemia and hypomagnesemia in setting of increased diuretics.  She is going to need effective diuresis so will have to watch K and Mg closely.   - She is back on torsemide 80 mg bid with KCl 60 bid.  - Started supplemental magnesium.  - She will take metolazone once a week on Mondays.   She will need an extra 60 mEq KCl on metolazone days.   - Increased spironolactone to bid - Continue Coreg and amiodarone.  - Restarted losartan 25 mg bid and ivabradine.  - No driving x 6 months.  2. Chronic Systolic Heart Failure: Nonischemic cardiomyopathy, s/p [employer] CRT-D. EF 30-35% (11/2014). CPX in 6/15 showed mild cardiac limitation but main issue seemed to be severe ventilatory limitation due to obesity and restrictive physiology. NYHA class IIIb symptoms recently.  Metolazone was added to regimen at last appointment, weight down a lot but with low K and Mg, had polymorphic VT.   - Restarted Coreg and Bidil.   - As above, increased spironolactone to 25 mg bid - Would decrease losartan  back to 25 mg bid at home given rise in creatinine.  - Digoxin level 1.1, digoxin restarted at lower dose (0.0625 daily).   3. OSA: Using CPAP.  4. Morbid Obesity: She is going to consider bariatric surgery eventually.  5. Leg weakness: Difficult to palpate pedal pulses but ABIs this admission normal. 6. AKI: Creatinine up to 1.75 with increased diuresis.  As above, will back down on metolazone to probably once a week and decrease losartan back to 25 bid. Creatinine down to 1.2 today. 7. Elevated troponin: Suspect this is due to multiple shocks.  8. Disposition: She can go home today.  She will need followup in office with BMET in about a week.  Home meds: Torsemide 80 mg bid, metolazone 2.5 mg once weekly on Mondays, KCl 60 mEq bid (will take KCl 60 mEq tid on metolazone days), spironolactone 25 mg  bid, magnesium oxide 400 mg daily, Corlanor 2.5 bid, losartan 25 bid (down from 50/25), digoxin 0.0625 (lower dose), Coreg 6.25 mg bid, Bidil 1/2 tablet tid.    [name] 01/01/2015 7:34 AM

## 2015-01-04 ENCOUNTER — Encounter (HOSPITAL_COMMUNITY): Payer: Self-pay

## 2015-01-04 ENCOUNTER — Inpatient Hospital Stay (HOSPITAL_COMMUNITY)
Admission: EM | Admit: 2015-01-04 | Discharge: 2015-01-11 | DRG: 287 | Disposition: A | Discharge status: Home Health | Payer: Medicare Other | Attending: Cardiology | Admitting: Cardiology

## 2015-01-04 ENCOUNTER — Encounter: Payer: Self-pay | Admitting: Cardiology

## 2015-01-04 ENCOUNTER — Emergency Department (HOSPITAL_COMMUNITY): Payer: Medicare Other

## 2015-01-04 ENCOUNTER — Other Ambulatory Visit: Payer: Self-pay | Admitting: *Deleted

## 2015-01-04 ENCOUNTER — Telehealth: Payer: Self-pay | Admitting: Cardiology

## 2015-01-04 DIAGNOSIS — R251 Tremor, unspecified: Secondary | ICD-10-CM | POA: Diagnosis not present

## 2015-01-04 DIAGNOSIS — G629 Polyneuropathy, unspecified: Secondary | ICD-10-CM

## 2015-01-04 DIAGNOSIS — N189 Chronic kidney disease, unspecified: Secondary | ICD-10-CM | POA: Diagnosis present

## 2015-01-04 DIAGNOSIS — R05 Cough: Secondary | ICD-10-CM

## 2015-01-04 DIAGNOSIS — Z7982 Long term (current) use of aspirin: Secondary | ICD-10-CM | POA: Diagnosis not present

## 2015-01-04 DIAGNOSIS — D509 Iron deficiency anemia, unspecified: Secondary | ICD-10-CM | POA: Diagnosis present

## 2015-01-04 DIAGNOSIS — I5043 Acute on chronic combined systolic (congestive) and diastolic (congestive) heart failure: Principal | ICD-10-CM | POA: Diagnosis present

## 2015-01-04 DIAGNOSIS — I472 Ventricular tachycardia, unspecified: Secondary | ICD-10-CM

## 2015-01-04 DIAGNOSIS — Z95810 Presence of automatic (implantable) cardiac defibrillator: Secondary | ICD-10-CM | POA: Diagnosis not present

## 2015-01-04 DIAGNOSIS — I129 Hypertensive chronic kidney disease with stage 1 through stage 4 chronic kidney disease, or unspecified chronic kidney disease: Secondary | ICD-10-CM | POA: Diagnosis present

## 2015-01-04 DIAGNOSIS — I509 Heart failure, unspecified: Secondary | ICD-10-CM | POA: Diagnosis not present

## 2015-01-04 DIAGNOSIS — E876 Hypokalemia: Secondary | ICD-10-CM | POA: Diagnosis present

## 2015-01-04 DIAGNOSIS — E1140 Type 2 diabetes mellitus with diabetic neuropathy, unspecified: Secondary | ICD-10-CM | POA: Diagnosis present

## 2015-01-04 DIAGNOSIS — Z87891 Personal history of nicotine dependence: Secondary | ICD-10-CM

## 2015-01-04 DIAGNOSIS — G4733 Obstructive sleep apnea (adult) (pediatric): Secondary | ICD-10-CM | POA: Diagnosis present

## 2015-01-04 DIAGNOSIS — T462X5A Adverse effect of other antidysrhythmic drugs, initial encounter: Secondary | ICD-10-CM | POA: Diagnosis not present

## 2015-01-04 DIAGNOSIS — I5041 Acute combined systolic (congestive) and diastolic (congestive) heart failure: Secondary | ICD-10-CM | POA: Diagnosis not present

## 2015-01-04 DIAGNOSIS — Z794 Long term (current) use of insulin: Secondary | ICD-10-CM | POA: Diagnosis not present

## 2015-01-04 DIAGNOSIS — K219 Gastro-esophageal reflux disease without esophagitis: Secondary | ICD-10-CM | POA: Diagnosis present

## 2015-01-04 DIAGNOSIS — N289 Disorder of kidney and ureter, unspecified: Secondary | ICD-10-CM

## 2015-01-04 DIAGNOSIS — I10 Essential (primary) hypertension: Secondary | ICD-10-CM | POA: Diagnosis present

## 2015-01-04 DIAGNOSIS — J441 Chronic obstructive pulmonary disease with (acute) exacerbation: Secondary | ICD-10-CM | POA: Diagnosis present

## 2015-01-04 DIAGNOSIS — R0600 Dyspnea, unspecified: Secondary | ICD-10-CM | POA: Diagnosis present

## 2015-01-04 DIAGNOSIS — I5023 Acute on chronic systolic (congestive) heart failure: Secondary | ICD-10-CM | POA: Diagnosis present

## 2015-01-04 DIAGNOSIS — Z9989 Dependence on other enabling machines and devices: Secondary | ICD-10-CM

## 2015-01-04 DIAGNOSIS — I428 Other cardiomyopathies: Secondary | ICD-10-CM

## 2015-01-04 DIAGNOSIS — N179 Acute kidney failure, unspecified: Secondary | ICD-10-CM | POA: Diagnosis present

## 2015-01-04 DIAGNOSIS — I429 Cardiomyopathy, unspecified: Secondary | ICD-10-CM | POA: Diagnosis present

## 2015-01-04 DIAGNOSIS — N182 Chronic kidney disease, stage 2 (mild): Secondary | ICD-10-CM | POA: Diagnosis not present

## 2015-01-04 DIAGNOSIS — E1129 Type 2 diabetes mellitus with other diabetic kidney complication: Secondary | ICD-10-CM | POA: Diagnosis present

## 2015-01-04 DIAGNOSIS — R059 Cough, unspecified: Secondary | ICD-10-CM

## 2015-01-04 DIAGNOSIS — I5021 Acute systolic (congestive) heart failure: Secondary | ICD-10-CM

## 2015-01-04 DIAGNOSIS — R0602 Shortness of breath: Secondary | ICD-10-CM | POA: Diagnosis present

## 2015-01-04 LAB — I-STAT CHEM 8, ED
BUN: 29 mg/dL — AB (ref 6–23)
CALCIUM ION: 1.1 mmol/L — AB (ref 1.12–1.23)
Chloride: 94 mmol/L — ABNORMAL LOW (ref 96–112)
Creatinine, Ser: 1.6 mg/dL — ABNORMAL HIGH (ref 0.50–1.10)
Glucose, Bld: 257 mg/dL — ABNORMAL HIGH (ref 70–99)
HEMATOCRIT: 55 % — AB (ref 36.0–46.0)
HEMOGLOBIN: 18.7 g/dL — AB (ref 12.0–15.0)
POTASSIUM: 4.1 mmol/L (ref 3.5–5.1)
Sodium: 138 mmol/L (ref 135–145)
TCO2: 31 mmol/L (ref 0–100)

## 2015-01-04 LAB — CBC
HEMATOCRIT: 49.7 % — AB (ref 36.0–46.0)
HEMOGLOBIN: 16 g/dL — AB (ref 12.0–15.0)
MCH: 29.9 pg (ref 26.0–34.0)
MCHC: 32.2 g/dL (ref 30.0–36.0)
MCV: 92.7 fL (ref 78.0–100.0)
PLATELETS: 322 10*3/uL (ref 150–400)
RBC: 5.36 MIL/uL — ABNORMAL HIGH (ref 3.87–5.11)
RDW: 14 % (ref 11.5–15.5)
WBC: 11.4 10*3/uL — ABNORMAL HIGH (ref 4.0–10.5)

## 2015-01-04 LAB — I-STAT TROPONIN, ED: Troponin i, poc: 0 ng/mL (ref 0.00–0.08)

## 2015-01-04 LAB — BRAIN NATRIURETIC PEPTIDE: B Natriuretic Peptide: 214.7 pg/mL — ABNORMAL HIGH (ref 0.0–100.0)

## 2015-01-04 LAB — MAGNESIUM: MAGNESIUM: 2.1 mg/dL (ref 1.5–2.5)

## 2015-01-04 LAB — I-STAT ARTERIAL BLOOD GAS, ED
Acid-Base Excess: 4 mmol/L — ABNORMAL HIGH (ref 0.0–2.0)
Bicarbonate: 33.5 mEq/L — ABNORMAL HIGH (ref 20.0–24.0)
O2 Saturation: 81 %
Patient temperature: 97.9
TCO2: 35 mmol/L (ref 0–100)
pCO2 arterial: 66.2 mmHg (ref 35.0–45.0)
pH, Arterial: 7.31 — ABNORMAL LOW (ref 7.350–7.450)
pO2, Arterial: 51 mmHg — ABNORMAL LOW (ref 80.0–100.0)

## 2015-01-04 LAB — BASIC METABOLIC PANEL
Anion gap: 14 (ref 5–15)
BUN: 23 mg/dL (ref 6–23)
CO2: 32 mmol/L (ref 19–32)
CREATININE: 1.53 mg/dL — AB (ref 0.50–1.10)
Calcium: 9.3 mg/dL (ref 8.4–10.5)
Chloride: 92 mmol/L — ABNORMAL LOW (ref 96–112)
GFR calc Af Amer: 45 mL/min — ABNORMAL LOW (ref >90)
GFR, EST NON AFRICAN AMERICAN: 39 mL/min — AB (ref >90)
Glucose, Bld: 256 mg/dL — ABNORMAL HIGH (ref 70–99)
Potassium: 4.1 mmol/L (ref 3.5–5.1)
SODIUM: 138 mmol/L (ref 135–145)

## 2015-01-04 LAB — CBG MONITORING, ED: Glucose-Capillary: 310 mg/dL — ABNORMAL HIGH (ref 70–99)

## 2015-01-04 MED ORDER — ISOSORB DINITRATE-HYDRALAZINE 20-37.5 MG PO TABS
0.5000 | ORAL_TABLET | Freq: Three times a day (TID) | ORAL | Status: DC
  Administered 2015-01-05 – 2015-01-11 (×18): 0.5 via ORAL
  Filled 2015-01-04 (×25): qty 0.5

## 2015-01-04 MED ORDER — MAGNESIUM OXIDE 400 (241.3 MG) MG PO TABS
400.0000 mg | ORAL_TABLET | Freq: Every day | ORAL | Status: DC
  Administered 2015-01-05 – 2015-01-11 (×7): 400 mg via ORAL
  Filled 2015-01-04 (×7): qty 1

## 2015-01-04 MED ORDER — FERROUS SULFATE 325 (65 FE) MG PO TABS
325.0000 mg | ORAL_TABLET | Freq: Three times a day (TID) | ORAL | Status: DC
  Administered 2015-01-05 – 2015-01-11 (×19): 325 mg via ORAL
  Filled 2015-01-04 (×22): qty 1

## 2015-01-04 MED ORDER — CYCLOBENZAPRINE HCL 10 MG PO TABS
10.0000 mg | ORAL_TABLET | Freq: Three times a day (TID) | ORAL | Status: DC | PRN
  Administered 2015-01-07: 10 mg via ORAL
  Filled 2015-01-04: qty 1

## 2015-01-04 MED ORDER — SODIUM CHLORIDE 0.9 % IJ SOLN
3.0000 mL | Freq: Two times a day (BID) | INTRAMUSCULAR | Status: DC
  Administered 2015-01-05 – 2015-01-11 (×7): 3 mL via INTRAVENOUS

## 2015-01-04 MED ORDER — TEMAZEPAM 15 MG PO CAPS
30.0000 mg | ORAL_CAPSULE | Freq: Every day | ORAL | Status: DC
  Administered 2015-01-05 – 2015-01-10 (×7): 30 mg via ORAL
  Filled 2015-01-04 (×7): qty 2

## 2015-01-04 MED ORDER — POTASSIUM CHLORIDE CRYS ER 20 MEQ PO TBCR
60.0000 meq | EXTENDED_RELEASE_TABLET | Freq: Two times a day (BID) | ORAL | Status: DC
  Administered 2015-01-05 – 2015-01-10 (×12): 60 meq via ORAL
  Filled 2015-01-04 (×15): qty 3

## 2015-01-04 MED ORDER — ASPIRIN EC 81 MG PO TBEC
81.0000 mg | DELAYED_RELEASE_TABLET | Freq: Every day | ORAL | Status: DC
  Administered 2015-01-05 – 2015-01-11 (×6): 81 mg via ORAL
  Filled 2015-01-04 (×7): qty 1

## 2015-01-04 MED ORDER — IVABRADINE HCL 5 MG PO TABS
2.5000 mg | ORAL_TABLET | Freq: Two times a day (BID) | ORAL | Status: DC
  Administered 2015-01-05 – 2015-01-09 (×8): 2.5 mg via ORAL
  Filled 2015-01-04 (×12): qty 1

## 2015-01-04 MED ORDER — ALBUTEROL SULFATE (2.5 MG/3ML) 0.083% IN NEBU: 2.5000 mg | INHALATION_SOLUTION | Freq: Four times a day (QID) | RESPIRATORY_TRACT | Status: DC | PRN

## 2015-01-04 MED ORDER — SODIUM CHLORIDE 0.9 % IV SOLN
250.0000 mL | INTRAVENOUS | Status: DC | PRN
  Administered 2015-01-05: 250 mL via INTRAVENOUS

## 2015-01-04 MED ORDER — DIGOXIN 0.0625 MG HALF TABLET
0.0625 mg | ORAL_TABLET | Freq: Every day | ORAL | Status: DC
  Administered 2015-01-05 – 2015-01-11 (×7): 0.0625 mg via ORAL
  Filled 2015-01-04 (×7): qty 1

## 2015-01-04 MED ORDER — INSULIN ASPART 100 UNIT/ML ~~LOC~~ SOLN: 0.0000 [IU] | Freq: Three times a day (TID) | SUBCUTANEOUS | Status: DC

## 2015-01-04 MED ORDER — FUROSEMIDE 10 MG/ML IJ SOLN
120.0000 mg | Freq: Two times a day (BID) | INTRAMUSCULAR | Status: DC
  Administered 2015-01-05 (×2): 120 mg via INTRAVENOUS
  Filled 2015-01-04 (×4): qty 12

## 2015-01-04 MED ORDER — HYDROCODONE-ACETAMINOPHEN 10-325 MG PO TABS
1.0000 | ORAL_TABLET | ORAL | Status: DC | PRN
Start: 2015-01-04 — End: 2015-01-11
  Administered 2015-01-05 – 2015-01-09 (×4): 1 via ORAL
  Filled 2015-01-04 (×4): qty 1

## 2015-01-04 MED ORDER — ONDANSETRON HCL 4 MG/2ML IJ SOLN: 4.0000 mg | Freq: Four times a day (QID) | INTRAMUSCULAR | Status: DC | PRN

## 2015-01-04 MED ORDER — INSULIN DETEMIR 100 UNIT/ML ~~LOC~~ SOLN
100.0000 [IU] | Freq: Every day | SUBCUTANEOUS | Status: DC
  Administered 2015-01-05 – 2015-01-10 (×7): 100 [IU] via SUBCUTANEOUS
  Filled 2015-01-04 (×10): qty 1

## 2015-01-04 MED ORDER — SODIUM CHLORIDE 0.9 % IJ SOLN: 3.0000 mL | INTRAMUSCULAR | Status: DC | PRN

## 2015-01-04 MED ORDER — CARVEDILOL 6.25 MG PO TABS
6.2500 mg | ORAL_TABLET | Freq: Two times a day (BID) | ORAL | Status: DC
  Administered 2015-01-05 – 2015-01-11 (×12): 6.25 mg via ORAL
  Filled 2015-01-04 (×15): qty 1

## 2015-01-04 MED ORDER — ALLOPURINOL 300 MG PO TABS
300.0000 mg | ORAL_TABLET | Freq: Every day | ORAL | Status: DC
  Administered 2015-01-05 – 2015-01-11 (×7): 300 mg via ORAL
  Filled 2015-01-04 (×7): qty 1

## 2015-01-04 MED ORDER — IPRATROPIUM-ALBUTEROL 0.5-2.5 (3) MG/3ML IN SOLN
3.0000 mL | Freq: Once | RESPIRATORY_TRACT | Status: AC
  Administered 2015-01-04: 3 mL via RESPIRATORY_TRACT
  Filled 2015-01-04: qty 3

## 2015-01-04 MED ORDER — PANTOPRAZOLE SODIUM 40 MG PO TBEC
40.0000 mg | DELAYED_RELEASE_TABLET | Freq: Every day | ORAL | Status: DC
  Administered 2015-01-05 – 2015-01-11 (×7): 40 mg via ORAL
  Filled 2015-01-04 (×7): qty 1

## 2015-01-04 MED ORDER — LOSARTAN POTASSIUM 25 MG PO TABS
25.0000 mg | ORAL_TABLET | Freq: Two times a day (BID) | ORAL | Status: DC
  Administered 2015-01-05 – 2015-01-11 (×13): 25 mg via ORAL
  Filled 2015-01-04 (×15): qty 1

## 2015-01-04 MED ORDER — HEPARIN SODIUM (PORCINE) 5000 UNIT/ML IJ SOLN
5000.0000 [IU] | Freq: Three times a day (TID) | INTRAMUSCULAR | Status: DC
  Administered 2015-01-05 (×4): 5000 [IU] via SUBCUTANEOUS
  Filled 2015-01-04 (×7): qty 1

## 2015-01-04 MED ORDER — AMIODARONE HCL 200 MG PO TABS
200.0000 mg | ORAL_TABLET | Freq: Every day | ORAL | Status: DC
  Administered 2015-01-05 – 2015-01-09 (×5): 200 mg via ORAL
  Filled 2015-01-04 (×5): qty 1

## 2015-01-04 MED ORDER — NITROGLYCERIN IN D5W 200-5 MCG/ML-% IV SOLN
5.0000 ug/min | INTRAVENOUS | Status: DC
  Administered 2015-01-04: 5 ug/min via INTRAVENOUS
  Filled 2015-01-04: qty 250

## 2015-01-04 MED ORDER — SPIRONOLACTONE 25 MG PO TABS
25.0000 mg | ORAL_TABLET | Freq: Two times a day (BID) | ORAL | Status: DC
  Administered 2015-01-05 – 2015-01-10 (×11): 25 mg via ORAL
  Filled 2015-01-04 (×15): qty 1

## 2015-01-04 MED ORDER — GABAPENTIN 300 MG PO CAPS
600.0000 mg | ORAL_CAPSULE | Freq: Four times a day (QID) | ORAL | Status: DC
  Administered 2015-01-05 – 2015-01-11 (×26): 600 mg via ORAL
  Filled 2015-01-04 (×29): qty 2

## 2015-01-04 MED ORDER — ACETAMINOPHEN 325 MG PO TABS: 650.0000 mg | ORAL_TABLET | ORAL | Status: DC | PRN

## 2015-01-04 MED ORDER — FUROSEMIDE 10 MG/ML IJ SOLN
80.0000 mg | Freq: Once | INTRAMUSCULAR | Status: AC
  Administered 2015-01-04: 80 mg via INTRAVENOUS
  Filled 2015-01-04: qty 8

## 2015-01-04 MED ORDER — ALBUTEROL SULFATE (5 MG/ML) 0.5% IN NEBU: 2.5000 mg | INHALATION_SOLUTION | RESPIRATORY_TRACT | Status: DC | PRN

## 2015-01-04 NOTE — H&P (Signed)
[name] is an 49 y.o. female.     Chief Complaint: dyspnea at rest Primary Cardiologist: [facility] HPI: [name] is a 49 yo woman with known PMH of OSA on CPAP, morbid obesity, nonischemic cardiomyopathy with BS CRT-D recently admitted 4/18-4/22 with recurrent ICD shocks for polymorphic VT thought related to hypokalemia/hypomagnesemia who presents with 2 days of progressive dyspnea. She tells me her weight yesterday and today in clinic was 274 lbs. She previously has weighed as low as 250s and recently as low as 265 but then had polymorphic VT. She tells me nothing else has been going on since discharge - no change in diet, no missed medications, no fever/chills/nausea/vomiting/diarrhea. She felt ok and was laughing with her mother until ~ 3pm today when she had development of worsening dyspnea. Her discharge weight was 272 lbs on 4/22. She was hypertensive when she presented and she received IV NTG with improved blood pressure and dyspnea. She was just given 80 mg IV lasix. She has no chest pain but feels like she has more fluid on board.    Past Medical History  Diagnosis Date  . CHF (congestive heart failure)     a. EF 30-35%, RV mildly dilated (difficult study 11/2013) b. RHC (12/2013): RA 38/37 (35), RV 90/28, PA 95/51 (71), PCWP 54, PA 40%, AO 96 %, CO/CI Fick 3.2/1.4, PVR 5.3   . Nonischemic cardiomyopathy 12/2013    a. LHC (12/2013) normal coronary anatomy   . Mitral regurgitation     moderate to severe  . S/P cardiac catheterization   . LBBB (left bundle branch block)     s/p [employer] CRT-D  . HTN (hypertension)   . Asthma   . IBS (irritable bowel syndrome)     with primarily constipation  . Morbid obesity   . GERD (gastroesophageal reflux disease)   . IDA (iron deficiency anemia)     2o TO SB AVMS, Hx parenteral iron Dr Tressie Stalker  . AVM (arteriovenous malformation)     Small bowel, s/p duble balloon enteroscopy/APC jejunum Dr Arsenio Loader [facility]) 01/23/2011  .  Anemia, chronic disease   . Peripheral neuropathy   . OSA (obstructive sleep apnea)     on CPAP qhs  . GI bleed 06/2009    Hgb 7.5, ferritin 7, transfusion, iron IV  . Dyspnea on exertion     chronic  . Diabetes mellitus without complication   . ICD (implantable cardioverter-defibrillator), single, in situ 07/28/2014 interrogation    Pt shocked 3x since 07/20/14---pt intentionally unplugged Latitude, remote received 07/27/14 once plugged in. Pt unaware of shock on 07/27/14. Thresholds and sensing consistent with previous device measurements. Lead impedance trends stable over time.     Past Surgical History  Procedure Laterality Date  . Crdt-implantation  4/07    [employer]. remote- yes  . Appendectomy    . Cholecystectomy      biliary dyskinesia  . Mastectomy  2003    left, partial  . Knee arthroscopy  2005    left  . Small bowel enteroscopy  MAY 2012 [facility] DR. GILLIAM    SB AVMS s/p APC  . Colonoscopy  OCT 2010/SEP 2011    tortuous colon, 3 polyps-benign(2010),   . Upper gastrointestinal endoscopy  '08, '10, SEP 2011    mild antral gastritis (2010), negative SB bx (2010), incomlpete Schatzki's ring (2011)  . Small bowel enteroscopy  SEP 2011 [facility]    Fields-NO AVMS  . Givens capsule study  NOV  2010     NO AVMS, normal  . Irrigation and debridement abscess  06/25/2012    Procedure: MINOR INCISION AND DRAINAGE OF ABSCESS;  Surgeon: Scherry Ran, MD;  Location: [facility] ORS;  Service: General;  Laterality: [name];  Incision & Drainage of Infected Sebaceous Cyst on Chest  . Breast surgery    . Cardiac defibrillator placement      [employer]  . Implantable cardioverter defibrillator generator change Left 02/15/2012    Procedure: IMPLANTABLE CARDIOVERTER DEFIBRILLATOR GENERATOR CHANGE;  Surgeon: Evans Lance, MD;  Location: [facility] CATH LAB;  Service: Cardiovascular;  Laterality: Left;  . Left and right heart catheterization with coronary/graft angiogram  12/12/2013     Procedure: LEFT AND RIGHT HEART CATHETERIZATION WITH Beatrix Fetters;  Surgeon: Peter M Martinique, MD;  Location: [facility] CATH LAB;  Service: Cardiovascular;;    Family History  Problem Relation Age of Onset  . Colon cancer Neg Hx     no family Hx of polyps too, uncle  . Cervical cancer Mother   . Hypertension Mother   . Cancer Mother   . Heart disease Father   . Hypertension Father   . GER disease Father   . Heart attack Father   . Bleeding Disorder Father   . Diabetes Sister   . Heart disease Sister   . Hypertension Sister    Social History:  reports that she quit smoking about 5 years ago. Her smoking use included Cigarettes. She has a 10 pack-year smoking history. She has quit using smokeless tobacco. She reports that she does not drink alcohol or use illicit drugs.  Allergies:  Allergies  Allergen Reactions  . Cymbalta [Duloxetine Hcl] Other (See Comments)    "Spontaneous type behavior"  . Trazodone And Nefazodone Other (See Comments)    Nightmares   . Lyrica [Pregabalin] Swelling     (Not in a hospital admission)  Results for orders placed or performed during the hospital encounter of 01/04/15 (from the past 48 hour(s))  CBG monitoring, ED     Status: Abnormal   Collection Time: 01/04/15  7:35 PM  Result Value Ref Range   Glucose-Capillary 310 (H) 70 - 99 mg/dL  CBC     Status: Abnormal   Collection Time: 01/04/15  7:45 PM  Result Value Ref Range   WBC 11.4 (H) 4.0 - 10.5 K/uL   RBC 5.36 (H) 3.87 - 5.11 MIL/uL   Hemoglobin 16.0 (H) 12.0 - 15.0 g/dL   HCT 49.7 (H) 36.0 - 46.0 %   MCV 92.7 78.0 - 100.0 fL   MCH 29.9 26.0 - 34.0 pg   MCHC 32.2 30.0 - 36.0 g/dL   RDW 14.0 11.5 - 15.5 %   Platelets 322 150 - 400 K/uL  Basic metabolic panel     Status: Abnormal   Collection Time: 01/04/15  7:45 PM  Result Value Ref Range   Sodium 138 135 - 145 mmol/L   Potassium 4.1 3.5 - 5.1 mmol/L   Chloride 92 (L) 96 - 112 mmol/L   CO2 32 19 - 32 mmol/L   Glucose, Bld  256 (H) 70 - 99 mg/dL   BUN 23 6 - 23 mg/dL   Creatinine, Ser 1.53 (H) 0.50 - 1.10 mg/dL   Calcium 9.3 8.4 - 10.5 mg/dL   GFR calc non Af Amer 39 (L) >90 mL/min   GFR calc Af Amer 45 (L) >90 mL/min    Comment: (NOTE) The eGFR has been calculated using the CKD EPI  equation. This calculation has not been validated in all clinical situations. eGFR's persistently <90 mL/min signify possible Chronic Kidney Disease.    Anion gap 14 5 - 15  BNP (order ONLY if patient complains of dyspnea/SOB AND you have documented it for THIS visit)     Status: Abnormal   Collection Time: 01/04/15  7:45 PM  Result Value Ref Range   B Natriuretic Peptide 214.7 (H) 0.0 - 100.0 pg/mL  I-stat troponin, ED (not at [facility])     Status: None   Collection Time: 01/04/15  7:45 PM  Result Value Ref Range   Troponin i, poc 0.00 0.00 - 0.08 ng/mL   Comment 3            Comment: Due to the release kinetics of cTnI, a negative result within the first hours of the onset of symptoms does not rule out myocardial infarction with certainty. If myocardial infarction is still suspected, repeat the test at appropriate intervals.   Magnesium     Status: None   Collection Time: 01/04/15  7:45 PM  Result Value Ref Range   Magnesium 2.1 1.5 - 2.5 mg/dL  I-stat Chem 8, ED     Status: Abnormal   Collection Time: 01/04/15  7:51 PM  Result Value Ref Range   Sodium 138 135 - 145 mmol/L   Potassium 4.1 3.5 - 5.1 mmol/L   Chloride 94 (L) 96 - 112 mmol/L   BUN 29 (H) 6 - 23 mg/dL   Creatinine, Ser 1.60 (H) 0.50 - 1.10 mg/dL   Glucose, Bld 257 (H) 70 - 99 mg/dL   Calcium, Ion 1.10 (L) 1.12 - 1.23 mmol/L   TCO2 31 0 - 100 mmol/L   Hemoglobin 18.7 (H) 12.0 - 15.0 g/dL   HCT 55.0 (H) 36.0 - 46.0 %  I-Stat arterial blood gas, ED     Status: Abnormal   Collection Time: 01/04/15  8:15 PM  Result Value Ref Range   pH, Arterial 7.310 (L) 7.350 - 7.450   pCO2 arterial 66.2 (HH) 35.0 - 45.0 mmHg   pO2, Arterial 51.0 (L) 80.0 - 100.0  mmHg   Bicarbonate 33.5 (H) 20.0 - 24.0 mEq/L   TCO2 35 0 - 100 mmol/L   O2 Saturation 81.0 %   Acid-Base Excess 4.0 (H) 0.0 - 2.0 mmol/L   Patient temperature 97.9 F    Collection site RADIAL, ALLEN'S TEST ACCEPTABLE    Drawn by RT    Sample type ARTERIAL    Comment NOTIFIED PHYSICIAN    Dg Chest Port 1 View  01/04/2015   CLINICAL DATA:  Shortness of breath 1 day.  EXAM: PORTABLE CHEST - 1 VIEW  COMPARISON:  12/29/2014 and 12/20/2014  FINDINGS: Left-sided pacemaker unchanged. Lungs are hypoinflated with moderate bilateral hazy perihilar opacification likely interstitial edema. Hazy left base opacification unchanged as cannot exclude left-sided effusion, atelectasis or infection. Stable cardiomegaly. Remainder the exam is unchanged.  IMPRESSION: Bilateral perihilar opacification slightly worse likely interstitial edema.  Stable left base opacification which may be due to effusion/atelectasis or infection.  Stable cardiomegaly.   Electronically Signed   By: Marin Olp M.D.   On: 01/04/2015 20:17    Review of Systems  Constitutional: Positive for malaise/fatigue. Negative for fever and chills.  HENT: Negative for ear discharge.   Eyes: Negative for double vision and photophobia.  Respiratory: Positive for shortness of breath and wheezing. Negative for hemoptysis.   Cardiovascular: Positive for orthopnea and leg swelling. Negative for chest pain.  Gastrointestinal: Positive for constipation. Negative for nausea and vomiting.  Genitourinary: Negative for dysuria and hematuria.  Musculoskeletal: Positive for back pain and joint pain. Negative for myalgias.  Skin: Negative for rash.  Neurological: Negative for dizziness, tingling, tremors and headaches.  Endo/Heme/Allergies: Negative for polydipsia.  Psychiatric/Behavioral: Negative for depression, suicidal ideas, hallucinations and substance abuse.    Blood pressure 101/62, pulse 79, temperature 97.9 F (36.6 C), temperature source Oral,  resp. rate 26, last menstrual period 06/21/2012, SpO2 95 %. Physical Exam  Nursing note and vitals reviewed. Constitutional: She is oriented to person, place, and time. She appears well-developed and well-nourished. She appears distressed.  HENT:  Head: Normocephalic and atraumatic.  Nose: Nose normal.  Mouth/Throat: Oropharynx is clear and moist. No oropharyngeal exudate.  Eyes: Conjunctivae and EOM are normal. Pupils are equal, round, and reactive to light. No scleral icterus.  Neck: Normal range of motion. Neck supple. JVD present.  JVP to midneck with +HJR  Cardiovascular: Normal rate, regular rhythm and intact distal pulses.  Exam reveals no gallop.   Murmur heard. Respiratory: She is in respiratory distress. She has wheezes. She has rales.  Mildly uncomfortable, scattered occasional wheezes, bibasilar rales some clearing superiorly  GI: Soft. Bowel sounds are normal. She exhibits no distension. There is no tenderness. There is no rebound.  Musculoskeletal: Normal range of motion. She exhibits edema.  Neurological: She is alert and oriented to person, place, and time. No cranial nerve deficit. Coordination normal.  Skin: Skin is warm and dry. No rash noted. She is not diaphoretic. No erythema.  Psychiatric: She has a normal mood and affect. Her behavior is normal. Judgment and thought content normal.    Labs reviewed; BNP 215, Cr 1.6, bun 29, co2 32, K 4.1, na 138 H/h 18.7/55, plt 322, wbc 11.4 Trop negative 3/16 Echo: EF 53-97%, diastolic dysfunction grade III EKG: ASVP at 90 abg 7.31/66/51 Chest x-ray: increased interstitial edema   Assessment/Plan  [name] is a 49 yo woman with PMH of NICM, morbid obesity, last known EF 30-35% with BS CRT-D admitted 4/18 -4/22 with recurrent ICD shocks for polymorphic VT thought 2/2 low K/Mg here with progressive Dyspnea.BNP surprisingly on 215. Exam with mild respiratory distress and elevated BNP. Weight up at least 2 lbs from  discharge. She has systolic and restrictive diastolic dysfunction with large neck and obesity making symptoms multifactorial and more difficult exam. Other contributors include pulmonary exacerbation, infection, bronchospasm among many other etiologies. I suspect this is HF given exam and weight gain. She may have a component of pulmonary exacerbation. For now, will reassess after 80 mg IV lasix kicks in. I have written for 120 mg IV lasix bid and I suspect she'll need a few day of diuresis. She can be challenging previously requiring a drip. Her CO2 is already 32.  Problem List Dyspnea Acute on chronic systolic and diastolic HF OSA on CPAP Acute renal failure on chronic kidney disease  Morbid Obesity  Plan:  1. Continue home medications including coreg, spironolactone, losartan for HF 2. 120 mg IV lasix bid - suspect she has third spaced fluid and her hypertension earlier caused the pulmonary edema in combination with diastolic HF. Will need to reassess diuretic requirement closely. She took her once weekly metolazone today.  3. Follow-up BMP in AM 4. If she does not improve with diuresis then low suspicion for pulmonary exacerbation   [name] 01/04/2015, 9:37 PM

## 2015-01-04 NOTE — ED Notes (Signed)
Per EMS - initial c/o abdominal pain, began radiating to chest. Pt has cardiac hx - CHF, defibrillator fired 12-14x last week. Pt hx diabetes. Began wheezing upon arrival to ED. Given 324mg  aspirin, 1 nitro with no relief. Pt c/o shortness of breath. RBBB on EKG. No IV access.

## 2015-01-04 NOTE — ED Notes (Signed)
CBG - 310 

## 2015-01-04 NOTE — Progress Notes (Signed)
Critical ABG results reported to Dr. Effie Shy. RT reported that this was a mixed venous sample. DR. Effie Shy stated that this sample a good. RT explained if another sample needed to please call RT. RT will monitor.

## 2015-01-04 NOTE — ED Provider Notes (Signed)
CSN: [id number]     Arrival date & time 01/04/15  1922 History   First MD Initiated Contact with Patient 01/04/15 1931     Chief Complaint  Patient presents with  . Shortness of Breath     (Consider location/radiation/quality/duration/timing/severity/associated sxs/prior Treatment) HPI   [name] is a 49 y.o. female who is here for evaluation of shortness of breath, with chest pain. Her symptoms, been ongoing for 2 days. She is using all medications including nebulizers, without relief. She was hospitalized last week with multiple episodes of defibrillator firing, hypokalemia and hypomagnesemia. She denies nausea, vomiting, fever or chills. She has a cough which is productive of white sputum..  Level V Caveat- severe illness   Past Medical History  Diagnosis Date  . CHF (congestive heart failure)     a. EF 30-35%, RV mildly dilated (difficult study 11/2013) b. RHC (12/2013): RA 38/37 (35), RV 90/28, PA 95/51 (71), PCWP 54, PA 40%, AO 96 %, CO/CI Fick 3.2/1.4, PVR 5.3   . Nonischemic cardiomyopathy 12/2013    a. LHC (12/2013) normal coronary anatomy   . Mitral regurgitation     moderate to severe  . S/P cardiac catheterization   . LBBB (left bundle branch block)     s/p [employer] CRT-D  . HTN (hypertension)   . Asthma   . IBS (irritable bowel syndrome)     with primarily constipation  . Morbid obesity   . GERD (gastroesophageal reflux disease)   . IDA (iron deficiency anemia)     2o TO SB AVMS, Hx parenteral iron Dr Mariel Sleet  . AVM (arteriovenous malformation)     Small bowel, s/p duble balloon enteroscopy/APC jejunum Dr Gwinda Passe [facility]) 01/23/2011  . Anemia, chronic disease   . Peripheral neuropathy   . OSA (obstructive sleep apnea)     on CPAP qhs  . GI bleed 06/2009    Hgb 7.5, ferritin 7, transfusion, iron IV  . Dyspnea on exertion     chronic  . Diabetes mellitus without complication   . ICD (implantable cardioverter-defibrillator), single, in situ  07/28/2014 interrogation    Pt shocked 3x since 07/20/14---pt intentionally unplugged Latitude, remote received 07/27/14 once plugged in. Pt unaware of shock on 07/27/14. Thresholds and sensing consistent with previous device measurements. Lead impedance trends stable over time.    Past Surgical History  Procedure Laterality Date  . Crdt-implantation  4/07    [employer]. remote- yes  . Appendectomy    . Cholecystectomy      biliary dyskinesia  . Mastectomy  2003    left, partial  . Knee arthroscopy  2005    left  . Small bowel enteroscopy  MAY 2012 [facility] DR. GILLIAM    SB AVMS s/p APC  . Colonoscopy  OCT 2010/SEP 2011    tortuous colon, 3 polyps-benign(2010),   . Upper gastrointestinal endoscopy  '08, '10, SEP 2011    mild antral gastritis (2010), negative SB bx (2010), incomlpete Schatzki's ring (2011)  . Small bowel enteroscopy  SEP 2011 [facility]    Fields-NO AVMS  . Givens capsule study  NOV 2010     NO AVMS, normal  . Irrigation and debridement abscess  06/25/2012    Procedure: MINOR INCISION AND DRAINAGE OF ABSCESS;  Surgeon: [name], MD;  Location: [facility] ORS;  Service: General;  Laterality: [name];  Incision & Drainage of Infected Sebaceous Cyst on Chest  . Breast surgery    . Cardiac defibrillator placement  [employer]  . Implantable cardioverter defibrillator generator change Left 02/15/2012    Procedure: IMPLANTABLE CARDIOVERTER DEFIBRILLATOR GENERATOR CHANGE;  Surgeon: Marinus Maw, MD;  Location: [facility] CATH LAB;  Service: Cardiovascular;  Laterality: Left;  . Left and right heart catheterization with coronary/graft angiogram  12/12/2013    Procedure: LEFT AND RIGHT HEART CATHETERIZATION WITH Isabel Caprice;  Surgeon: Peter M Swaziland, MD;  Location: [facility] CATH LAB;  Service: Cardiovascular;;   Family History  Problem Relation Age of Onset  . Colon cancer Neg Hx     no family Hx of polyps too, uncle  . Cervical cancer Mother   . Hypertension  Mother   . Cancer Mother   . Heart disease Father   . Hypertension Father   . GER disease Father   . Heart attack Father   . Bleeding Disorder Father   . Diabetes Sister   . Heart disease Sister   . Hypertension Sister    History  Substance Use Topics  . Smoking status: Former Smoker -- 0.50 packs/day for 20 years    Types: Cigarettes    Quit date: 11/19/2009  . Smokeless tobacco: Former Neurosurgeon  . Alcohol Use: No   OB History    Gravida Para Term Preterm AB TAB SAB Ectopic Multiple Living   6 1 1  5  3 2        Review of Systems  Unable to perform ROS     Allergies  Cymbalta; Trazodone and nefazodone; and Lyrica  Home Medications   Prior to Admission medications   Medication Sig Start Date End Date Taking? Authorizing Provider  albuterol (PROVENTIL HFA;VENTOLIN HFA) 108 (90 BASE) MCG/ACT inhaler Inhale 2 puffs into the lungs every 6 (six) hours as needed for wheezing or shortness of breath.    Historical Provider, MD  albuterol (PROVENTIL) (5 MG/ML) 0.5% nebulizer solution Take 0.5 mLs (2.5 mg total) by nebulization every 4 (four) hours as needed for wheezing or shortness of breath. 11/21/12   Gareth Morgan, MD  allopurinol (ZYLOPRIM) 300 MG tablet Take 1 tablet (300 mg total) by mouth daily. 08/14/14   [name] D Filbert Schilder, NP  amiodarone (PACERONE) 200 MG tablet Take 1 tablet (200 mg total) by mouth daily. 01/01/15   [name] D Filbert Schilder, NP  aspirin EC 81 MG EC tablet Take 1 tablet (81 mg total) by mouth daily. 12/16/13   Aundria Rud, NP  carvedilol (COREG) 6.25 MG tablet Take 1 tablet (6.25 mg total) by mouth 2 (two) times daily with a meal. 10/19/14   Marinus Maw, MD  cetirizine (ZYRTEC) 10 MG tablet Take 10 mg by mouth daily as needed for allergies.     Historical Provider, MD  cyclobenzaprine (FLEXERIL) 10 MG tablet Take 10 mg by mouth 3 (three) times daily as needed for muscle spasms.     Historical Provider, MD  digoxin (LANOXIN) 0.125 MG tablet Take 0.5 tablets (0.0625 mg total) by  mouth daily. 01/01/15   [name] D Filbert Schilder, NP  ferrous sulfate 325 (65 FE) MG tablet Take 325 mg by mouth 3 (three) times daily.    Historical Provider, MD  gabapentin (NEURONTIN) 300 MG capsule Take 600 mg by mouth 4 (four) times daily.    Historical Provider, MD  HYDROcodone-acetaminophen (NORCO) 10-325 MG per tablet Take 1 tablet by mouth every 4 (four) hours as needed for moderate pain.  11/11/13   Historical Provider, MD  insulin aspart (NOVOLOG) 100 UNIT/ML injection Inject 0-20 Units into the skin 3 (  three) times daily with meals. 06/28/12   Gareth Morgan, MD  insulin detemir (LEVEMIR) 100 UNIT/ML injection Inject 100 Units into the skin at bedtime.     Historical Provider, MD  isosorbide-hydrALAZINE (BIDIL) 20-37.5 MG per tablet Take 0.5 tablets by mouth 3 (three) times daily. Patient not taking: Reported on 12/29/2014 12/24/14   Laurey Morale, MD  ivabradine (CORLANOR) 5 MG TABS tablet Take 0.5 tablets (2.5 mg total) by mouth 2 (two) times daily with a meal. 08/21/14   Dolores Patty, MD  losartan (COZAAR) 50 MG tablet Take 0.5 tablets (25 mg total) by mouth 2 (two) times daily. 01/01/15   [name] D Clegg, NP  magnesium oxide (MAG-OX) 400 (241.3 MG) MG tablet Take 1 tablet (400 mg total) by mouth daily. 01/01/15   [name] D Filbert Schilder, NP  metolazone (ZAROXOLYN) 2.5 MG tablet Take 1 tablet (2.5 mg total) by mouth once a week. Take one tablet by mouth 30 minutes before taking morning dose of Torsemide on MONDAYS 01/01/15   [name] D Clegg, NP  omeprazole (PRILOSEC) 40 MG capsule Take 1 capsule (40 mg total) by mouth daily. 03/27/13   Marinus Maw, MD  potassium chloride SA (K-DUR,KLOR-CON) 20 MEQ tablet Take 3 tablets (60 mEq total) by mouth 2 (two) times daily. Take an extra 60 meq of potassium on Metolazone days. 01/01/15   [name] D Filbert Schilder, NP  spironolactone (ALDACTONE) 25 MG tablet Take 1 tablet (25 mg total) by mouth 2 (two) times daily. 01/01/15   [name] D Clegg, NP  temazepam (RESTORIL) 30 MG capsule Take 30 mg by  mouth at bedtime.    Historical Provider, MD  torsemide (DEMADEX) 20 MG tablet Take 4 tablets (80 mg total) by mouth 2 (two) times daily. 11/23/14   Laurey Morale, MD  vitamin E 400 UNIT capsule Take 400 Units by mouth daily.    Historical Provider, MD   BP 158/52 mmHg  Pulse 94  Temp(Src) 97.9 F (36.6 C) (Oral)  Resp 32  SpO2 97%  LMP 06/21/2012 Physical Exam  Constitutional: She is oriented to person, place, and time. She appears well-developed. She appears distressed (She is uncomfortable).  Morbidly obese  HENT:  Head: Normocephalic and atraumatic.  Right Ear: External ear normal.  Left Ear: External ear normal.  Eyes: Conjunctivae and EOM are normal. Pupils are equal, round, and reactive to light.  Neck: Normal range of motion and phonation normal. Neck supple.  Cardiovascular: Normal rate, regular rhythm and normal heart sounds.   Pulmonary/Chest: She is in respiratory distress. She has no wheezes. She exhibits no tenderness and no bony tenderness.  Tachypnea  Abdominal: Soft. There is no tenderness.  Musculoskeletal: Normal range of motion. She exhibits edema (Legs, bilaterally).  Neurological: She is alert and oriented to person, place, and time. No cranial nerve deficit or sensory deficit. She exhibits normal muscle tone. Coordination normal.  Skin: Skin is warm, dry and intact.  Psychiatric: She has a normal mood and affect. Her behavior is normal. Judgment and thought content normal.  Nursing note and vitals reviewed.   ED Course  Procedures (including critical care time)  Medications  nitroGLYCERIN 50 mg in dextrose 5 % 250 mL (0.2 mg/mL) infusion (5 mcg/min Intravenous New Bag/Given 01/04/15 2013)  ipratropium-albuterol (DUONEB) 0.5-2.5 (3) MG/3ML nebulizer solution 3 mL (3 mLs Nebulization Given 01/04/15 1943)  furosemide (LASIX) injection 80 mg (80 mg Intravenous Given 01/04/15 2009)    Patient Vitals for the past 24 hrs:  BP  Temp Temp src Pulse Resp SpO2   01/04/15 2030 139/75 mmHg - - 88 (!) 29 100 %  01/04/15 2015 133/75 mmHg - - 93 26 100 %  01/04/15 1930 (!) 158/52 mmHg 97.9 F (36.6 C) Oral 94 (!) 32 97 %   19:54- Page out to cardiology.  8:23 PM Reevaluation with update and discussion. After initial assessment and treatment, an updated evaluation reveals patient remains alert and more comfortable. Tachypnea has improved somewhat. Arterial blood gas indicates mixed venous and arterial sample. No response from cardiology yet. Josyah Achor L   9:18 PM- cardiology returned call and will see patient for admission.  CRITICAL CARE Performed by: Flint Melter Total critical care time: 45 minutes Critical care time was exclusive of separately billable procedures and treating other patients. Critical care was necessary to treat or prevent imminent or life-threatening deterioration. Critical care was time spent personally by me on the following activities: development of treatment plan with patient and/or surrogate as well as nursing, discussions with consultants, evaluation of patient's response to treatment, examination of patient, obtaining history from patient or surrogate, ordering and performing treatments and interventions, ordering and review of laboratory studies, ordering and review of radiographic studies, pulse oximetry and re-evaluation of patient's condition.   Labs Review Labs Reviewed  CBC - Abnormal; Notable for the following:    WBC 11.4 (*)    RBC 5.36 (*)    Hemoglobin 16.0 (*)    HCT 49.7 (*)    All other components within normal limits  BASIC METABOLIC PANEL - Abnormal; Notable for the following:    Chloride 92 (*)    Glucose, Bld 256 (*)    Creatinine, Ser 1.53 (*)    GFR calc non Af Amer 39 (*)    GFR calc Af Amer 45 (*)    All other components within normal limits  I-STAT CHEM 8, ED - Abnormal; Notable for the following:    Chloride 94 (*)    BUN 29 (*)    Creatinine, Ser 1.60 (*)    Glucose, Bld 257 (*)     Calcium, Ion 1.10 (*)    Hemoglobin 18.7 (*)    HCT 55.0 (*)    All other components within normal limits  I-STAT ARTERIAL BLOOD GAS, ED - Abnormal; Notable for the following:    pH, Arterial 7.310 (*)    pCO2 arterial 66.2 (*)    pO2, Arterial 51.0 (*)    Bicarbonate 33.5 (*)    Acid-Base Excess 4.0 (*)    All other components within normal limits  MAGNESIUM  BRAIN NATRIURETIC PEPTIDE  BLOOD GAS, ARTERIAL  I-STAT TROPOININ, ED    Imaging Review Dg Chest Port 1 View  01/04/2015   CLINICAL DATA:  Shortness of breath 1 day.  EXAM: PORTABLE CHEST - 1 VIEW  COMPARISON:  12/29/2014 and 12/20/2014  FINDINGS: Left-sided pacemaker unchanged. Lungs are hypoinflated with moderate bilateral hazy perihilar opacification likely interstitial edema. Hazy left base opacification unchanged as cannot exclude left-sided effusion, atelectasis or infection. Stable cardiomegaly. Remainder the exam is unchanged.  IMPRESSION: Bilateral perihilar opacification slightly worse likely interstitial edema.  Stable left base opacification which may be due to effusion/atelectasis or infection.  Stable cardiomegaly.   Electronically Signed   By: Elberta Fortis M.D.   On: 01/04/2015 20:17     EKG Interpretation   Date/Time:  Monday January 04 2015 19:23:55 EDT Ventricular Rate:  90 PR Interval:  58 QRS Duration: 169 QT Interval:  491 QTC  Calculation: 601 R Axis:   -149 Text Interpretation:  Atrial-sensed ventricular-paced rhythm No further  analysis attempted due to paced rhythm Baseline wander in lead(s) V5 since  last tracing no significant change Confirmed by [facility]  MD, Kalese Ensz [phone])  on 01/04/2015 7:32:03 PM      MDM   Final diagnoses:  Acute systolic congestive heart failure  Cough    Acute congestive heart failure, with a component of COPD exacerbation. He has a productive cough, white sputum. Doubt serous bacterial infection or metabolic instability. She will require admission to cardiology  service for evaluation and treatment.  Nursing Notes Reviewed/ Care Coordinated, and agree without changes. Applicable Imaging Reviewed.  Interpretation of Laboratory Data incorporated into ED treatment  Plan: Admit    Mancel Bale, MD 01/04/15 2123

## 2015-01-04 NOTE — ED Notes (Signed)
Contacted pt husband per pt request to update on status.

## 2015-01-04 NOTE — ED Notes (Signed)
Report given to RN on 3E.

## 2015-01-04 NOTE — Telephone Encounter (Signed)
Pt called with significant SOB and feeling like she would pass out, diaphoretic, instructed to call EMS with recent V. Tach.  She will call now.

## 2015-01-04 NOTE — Patient Outreach (Signed)
[name] was referred to [facility] Care Management at discharge from the hospital on 01/01/15. I spoke with her today by phone and she reports feeling better although quite sore from multiple shocks for VT (> 20 per her report). She saw Dr. [name] in the office today and has an appointment with cardiology on Thursday. She and I have scheduled a face to face visit at her home on Friday and will spend time reviewing disease management and care management needs.    [name] MHA,BSN,RN,CCM [facility] Care Management  [phone]

## 2015-01-05 ENCOUNTER — Encounter: Payer: Self-pay | Admitting: *Deleted

## 2015-01-05 ENCOUNTER — Encounter (HOSPITAL_COMMUNITY): Payer: Self-pay | Admitting: General Practice

## 2015-01-05 ENCOUNTER — Telehealth (HOSPITAL_COMMUNITY): Payer: Self-pay | Admitting: Vascular Surgery

## 2015-01-05 DIAGNOSIS — I5041 Acute combined systolic (congestive) and diastolic (congestive) heart failure: Secondary | ICD-10-CM

## 2015-01-05 LAB — GLUCOSE, CAPILLARY
Glucose-Capillary: 167 mg/dL — ABNORMAL HIGH (ref 70–99)
Glucose-Capillary: 115 mg/dL — ABNORMAL HIGH (ref 70–99)
Glucose-Capillary: 162 mg/dL — ABNORMAL HIGH (ref 70–99)
Glucose-Capillary: 193 mg/dL — ABNORMAL HIGH (ref 70–99)
Glucose-Capillary: 218 mg/dL — ABNORMAL HIGH (ref 70–99)

## 2015-01-05 LAB — PROTIME-INR
INR: 1.2 (ref 0.00–1.49)
Prothrombin Time: 15.3 seconds — ABNORMAL HIGH (ref 11.6–15.2)

## 2015-01-05 LAB — BASIC METABOLIC PANEL
ANION GAP: 15 (ref 5–15)
BUN: 25 mg/dL — ABNORMAL HIGH (ref 6–23)
CO2: 31 mmol/L (ref 19–32)
Calcium: 8.9 mg/dL (ref 8.4–10.5)
Chloride: 92 mmol/L — ABNORMAL LOW (ref 96–112)
Creatinine, Ser: 1.28 mg/dL — ABNORMAL HIGH (ref 0.50–1.10)
GFR, EST AFRICAN AMERICAN: 56 mL/min — AB (ref >90)
GFR, EST NON AFRICAN AMERICAN: 49 mL/min — AB (ref >90)
Glucose, Bld: 111 mg/dL — ABNORMAL HIGH (ref 70–99)
POTASSIUM: 4.2 mmol/L (ref 3.5–5.1)
Sodium: 138 mmol/L (ref 135–145)

## 2015-01-05 MED ORDER — SODIUM CHLORIDE 0.9 % IV SOLN
INTRAVENOUS | Status: DC
  Administered 2015-01-06: 20 mL/h via INTRAVENOUS

## 2015-01-05 MED ORDER — SODIUM CHLORIDE 0.9 % IV SOLN: 250.0000 mL | INTRAVENOUS | Status: DC | PRN

## 2015-01-05 MED ORDER — INSULIN ASPART 100 UNIT/ML ~~LOC~~ SOLN
0.0000 [IU] | Freq: Three times a day (TID) | SUBCUTANEOUS | Status: DC
  Administered 2015-01-05: 3 [IU] via SUBCUTANEOUS
  Administered 2015-01-06 (×2): 2 [IU] via SUBCUTANEOUS

## 2015-01-05 MED ORDER — ASPIRIN 81 MG PO CHEW
81.0000 mg | CHEWABLE_TABLET | ORAL | Status: AC
  Administered 2015-01-06: 81 mg via ORAL
  Filled 2015-01-05: qty 1

## 2015-01-05 MED ORDER — SODIUM CHLORIDE 0.9 % IJ SOLN: 3.0000 mL | INTRAMUSCULAR | Status: DC | PRN

## 2015-01-05 MED ORDER — SODIUM CHLORIDE 0.9 % IJ SOLN
3.0000 mL | Freq: Two times a day (BID) | INTRAMUSCULAR | Status: DC
  Administered 2015-01-05 – 2015-01-06 (×3): 3 mL via INTRAVENOUS

## 2015-01-05 MED ORDER — INSULIN ASPART 100 UNIT/ML ~~LOC~~ SOLN
0.0000 [IU] | Freq: Every day | SUBCUTANEOUS | Status: DC
  Administered 2015-01-05 – 2015-01-09 (×2): 2 [IU] via SUBCUTANEOUS

## 2015-01-05 NOTE — Progress Notes (Signed)
CARDIAC REHAB PHASE I   PRE:  Rate/Rhythm: 70 pacing    BP: sitting 106/65    SaO2: 94 4L  MODE:  Ambulation: to door   POST:  Rate/Rhythm: 71 paced    BP: sitting 114/73     SaO2: 98 4L  On arrival pt c/o nausea and HA but wanting to walk. Used RW, 4L O2, standby assist. At door of room pt c/o need to vomit. Sat to rest and took vitals which were stable (BP 112/70, saO2 97 4L, HR 70 pacing). Pt c/o feeling hot and used paper to fan and also cold cloth. Pt able to walk to recliner although sts she was starting to feel hot again. No fluctuation in vitals noted.   [id number]  [name] CES, ACSM 01/05/2015 3:42 PM

## 2015-01-05 NOTE — Progress Notes (Signed)
Clarified order from dr Tresa Endo re nitro drip with orders received to discontinue nitrodrip

## 2015-01-05 NOTE — Patient Outreach (Signed)
[facility]) Care Management  I spoke with [name] yesterday afternoon between the hours of 4:30pm and 5:00 for transition of care assessment. At that time, [name] told me she'd seen her primary care provider, Dr. John Giovanni in the office earlier in the day. She said her chest was sore from numerous previous shocks but she denied shortness of breath or chest pain. [name] told me she still had mild edema and her weight was 274 at the provider office (hospital discharge weight 272). She says her "baseline" weight is 265 (home scale). She said that in general she felt improved, less short of breath, and less swollen. [name] had all prescribed medications, by her report, and has been taking them as prescribed. [name] has a cardiology appointment on Thursday and I am scheduled to see her at home on Friday.   I was notified by hospital liaison Charlesetta Shanks RN that [name] presented to the ED yesterday evening with chest pain and shortness of breath. I will follow [name]'s progress along with hospital liaison.    Marja Kays MHA,BSN,RN,CCM [facility]  [phone]

## 2015-01-05 NOTE — Progress Notes (Signed)
Advanced Heart Failure Rounding Note   Subjective:    Admitted last night with increased dyspnea from volume overload. Hypertensive in the ED and received IV NTG. Diuresing with 120 mg IV lasix.   Prior to admit weight went up 266 to 273 pounds. Says she was drinking lots of fluids.   Complains of fatigue. Breathing better.    Objective:   Weight Range:  Vital Signs:   Temp:  [97.6 F (36.4 C)-98.2 F (36.8 C)] 98.2 F (36.8 C) (04/26 0630) Pulse Rate:  [59-94] 72 (04/26 1034) Resp:  [19-32] 20 (04/26 0630) BP: (96-158)/(52-77) 112/69 mmHg (04/26 1034) SpO2:  [93 %-100 %] 99 % (04/26 0630) Weight:  [267 lb 13.7 oz (121.5 kg)] 267 lb 13.7 oz (121.5 kg) (04/25 2354) Last BM Date: 01/04/15  Weight change: Filed Weights   01/04/15 2354  Weight: 267 lb 13.7 oz (121.5 kg)    Intake/Output:   Intake/Output Summary (Last 24 hours) at 01/05/15 1051 Last data filed at 01/05/15 1044  Gross per 24 hour  Intake    835 ml  Output   2125 ml  Net  -1290 ml     Physical Exam: General:  Well appearing. No resp difficulty HEENT: normal Neck: supple. JVP difficult to assess due to body habitus, suspect elevated. Carotids 2+ bilat; no bruits. No lymphadenopathy or thryomegaly appreciated. Cor: PMI nondisplaced. Regular rate & rhythm. No rubs, gallops or murmurs. Lungs: Decreased breath sounds. On 3 liters Grady. Oxygen.  Abdomen: Obese, soft, nontender, nondistended. No hepatosplenomegaly. No bruits or masses. Good bowel sounds. Extremities: no cyanosis, clubbing, rash, edema Neuro: alert & orientedx3, cranial nerves grossly intact. moves all 4 extremities w/o difficulty. Affect pleasant  Telemetry: AV paced 70s  Labs: Basic Metabolic Panel:  Recent Labs Lab 12/30/14 0512 12/31/14 0457 01/01/15 0617 01/04/15 1945 01/04/15 1951 01/05/15 0557  NA 136 135 136 138 138 138  K 3.4* 3.6 3.9 4.1 4.1 4.2  CL 86* 89* 92* 92* 94* 92*  CO2 37* 35* 34* 32  --  31  GLUCOSE 164* 165*  146* 256* 257* 111*  BUN 30* 28* 24* 23 29* 25*  CREATININE 1.45* 1.20* 1.22* 1.53* 1.60* 1.28*  CALCIUM 8.9 8.8 8.2* 9.3  --  8.9  MG 2.0 2.2 2.0 2.1  --   --     Liver Function Tests: No results for input(s): AST, ALT, ALKPHOS, BILITOT, PROT, ALBUMIN in the last 168 hours. No results for input(s): LIPASE, AMYLASE in the last 168 hours. No results for input(s): AMMONIA in the last 168 hours.  CBC:  Recent Labs Lab 01/04/15 1945 01/04/15 1951  WBC 11.4*  --   HGB 16.0* 18.7*  HCT 49.7* 55.0*  MCV 92.7  --   PLT 322  --     Cardiac Enzymes: No results for input(s): CKTOTAL, CKMB, CKMBINDEX, TROPONINI in the last 168 hours.  BNP: BNP (last 3 results)  Recent Labs  12/20/14 2212 01/04/15 1945  BNP 235.5* 214.7*    ProBNP (last 3 results)  Recent Labs  08/04/14 2339 08/21/14 1117  PROBNP 1727.0* 171.9*      Other results:  Imaging: Dg Chest Port 1 View  01/04/2015   CLINICAL DATA:  Shortness of breath 1 day.  EXAM: PORTABLE CHEST - 1 VIEW  COMPARISON:  12/29/2014 and 12/20/2014  FINDINGS: Left-sided pacemaker unchanged. Lungs are hypoinflated with moderate bilateral hazy perihilar opacification likely interstitial edema. Hazy left base opacification unchanged as cannot exclude left-sided effusion, atelectasis or infection.  Stable cardiomegaly. Remainder the exam is unchanged.  IMPRESSION: Bilateral perihilar opacification slightly worse likely interstitial edema.  Stable left base opacification which may be due to effusion/atelectasis or infection.  Stable cardiomegaly.   Electronically Signed   By: Elberta Fortis M.D.   On: 01/04/2015 20:17      Medications:     Scheduled Medications: . allopurinol  300 mg Oral Daily  . amiodarone  200 mg Oral Daily  . aspirin EC  81 mg Oral Daily  . carvedilol  6.25 mg Oral BID WC  . digoxin  0.0625 mg Oral Daily  . ferrous sulfate  325 mg Oral TID  . furosemide  120 mg Intravenous BID  . gabapentin  600 mg Oral QID   . heparin  5,000 Units Subcutaneous 3 times per day  . insulin aspart  0-20 Units Subcutaneous TID WC  . insulin detemir  100 Units Subcutaneous QHS  . isosorbide-hydrALAZINE  0.5 tablet Oral TID  . ivabradine  2.5 mg Oral BID WC  . losartan  25 mg Oral BID  . magnesium oxide  400 mg Oral Daily  . pantoprazole  40 mg Oral Daily  . potassium chloride SA  60 mEq Oral BID  . sodium chloride  3 mL Intravenous Q12H  . spironolactone  25 mg Oral BID  . temazepam  30 mg Oral QHS     Infusions:     PRN Medications:  sodium chloride, acetaminophen, albuterol, cyclobenzaprine, HYDROcodone-acetaminophen, ondansetron (ZOFRAN) IV, sodium chloride   Assessment:   1. A/C Systolic HF- Nonischemic cardiomyopathy, s/p [employer] CRT-D. EF 30-35% (11/2014). CPX in 6/15 showed mild cardiac limitation but main issue seemed to be severe ventilatory limitation due to obesity and restrictive physiology. Admitted with volume overload. Has gained 8 pounds since discharge likely due increase fluid  Intake. Diuresing with IV lasix 120 mg twice a day. Weight down 1 pounds. Renal function ok.  Continue current dose of carvedilol, losartan, spiro, and bidil.   BP soft will watch closely.  Check BMET daily.  Consult cardiac rehab. Consult dietitian. 2. 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 K and Mag stable. Continue current dose of amiodarone, potassium, magnesium, and spiro.  3. OSA- Using CPAP 4. Morbid Obesity  Disposition: Home next 1-2 days.   Length of Stay: 1  CLEGG,[name] NP-C  01/05/2015, 10:51 AM  Advanced Heart Failure Team Pager [phone] (M-F; 7a - 4p)  Please contact [facility] for night-coverage after hours (4p -7a ) and weekends on [email or web address]  Patient seen with NP, agree with the above note.  She has been readmitted for volume overload.  No further arrhythmias.  K is ok. Exam is difficult but think that she needs further diuresis.  Agree  with continuing home meds but will use Lasix 120 mg IV bid for now.  Will plan RHC tomorrow.  Check digoxin level in am.   [name] 01/05/2015 11:29 AM

## 2015-01-05 NOTE — Consult Note (Signed)
   [facility] CM Inpatient Consult   01/05/2015  [name] 29-Mar-1966 [id number] Met with patient and she states she had spoken with her [place] manager yesterday.  She had planned a home visit this Friday with the nurse as well.  She plans to continue with [facility] services. Patient is currently active with [facility] for chronic disease management services.  Made community nurse aware of patient's hospitalization as well.   Our community based plan of care has focused on disease management and community resource support.  Patient will receive a post discharge call and will be evaluated for monthly home visits for assessments and disease process education.  Made Inpatient Case Manager aware that [facility] following. Of note, [facility] Care Management services does not replace or interfere with any services that are arranged by inpatient case management or social work.  For additional questions or referrals please contact: Natividad Brood, RN BSN [facility] Liaison  [phone] business mobile phone

## 2015-01-05 NOTE — Telephone Encounter (Signed)
Pt has was admitted last night.

## 2015-01-05 NOTE — Telephone Encounter (Signed)
Pt left message with answering service last night.. Pt is having increased SOB, and breaking out in sweats.. Please advise

## 2015-01-05 NOTE — Telephone Encounter (Signed)
Attempted to reach patient no answer and voicemail full.  Need more info... Swelling? Fever? Weight gain?

## 2015-01-06 ENCOUNTER — Encounter (HOSPITAL_COMMUNITY): Payer: Self-pay | Admitting: Cardiology

## 2015-01-06 ENCOUNTER — Encounter (HOSPITAL_COMMUNITY)
Admission: EM | Disposition: A | Discharge status: Home Health | Payer: Self-pay | Source: Home / Self Care | Attending: Cardiology

## 2015-01-06 ENCOUNTER — Encounter: Payer: Self-pay | Admitting: Internal Medicine

## 2015-01-06 DIAGNOSIS — I472 Ventricular tachycardia: Secondary | ICD-10-CM

## 2015-01-06 DIAGNOSIS — I509 Heart failure, unspecified: Secondary | ICD-10-CM

## 2015-01-06 HISTORY — PX: RIGHT HEART CATHETERIZATION: SHX5447

## 2015-01-06 LAB — CBC
HCT: 44.8 % (ref 36.0–46.0)
Hemoglobin: 14.4 g/dL (ref 12.0–15.0)
MCH: 29.8 pg (ref 26.0–34.0)
MCHC: 32.1 g/dL (ref 30.0–36.0)
MCV: 92.8 fL (ref 78.0–100.0)
PLATELETS: 267 10*3/uL (ref 150–400)
RBC: 4.83 MIL/uL (ref 3.87–5.11)
RDW: 13.8 % (ref 11.5–15.5)
WBC: 5.9 10*3/uL (ref 4.0–10.5)

## 2015-01-06 LAB — BASIC METABOLIC PANEL
ANION GAP: 14 (ref 5–15)
BUN: 33 mg/dL — ABNORMAL HIGH (ref 6–23)
CHLORIDE: 89 mmol/L — AB (ref 96–112)
CO2: 33 mmol/L — AB (ref 19–32)
CREATININE: 1.28 mg/dL — AB (ref 0.50–1.10)
Calcium: 9.2 mg/dL (ref 8.4–10.5)
GFR calc Af Amer: 56 mL/min — ABNORMAL LOW (ref >90)
GFR calc non Af Amer: 49 mL/min — ABNORMAL LOW (ref >90)
Glucose, Bld: 123 mg/dL — ABNORMAL HIGH (ref 70–99)
Potassium: 4.4 mmol/L (ref 3.5–5.1)
SODIUM: 136 mmol/L (ref 135–145)

## 2015-01-06 LAB — DIGOXIN LEVEL: Digoxin Level: 0.7 ng/mL — ABNORMAL LOW (ref 0.8–2.0)

## 2015-01-06 LAB — POCT I-STAT 3, VENOUS BLOOD GAS (G3P V)
ACID-BASE EXCESS: 10 mmol/L — AB (ref 0.0–2.0)
Acid-Base Excess: 8 mmol/L — ABNORMAL HIGH (ref 0.0–2.0)
Bicarbonate: 37.3 mEq/L — ABNORMAL HIGH (ref 20.0–24.0)
Bicarbonate: 40 mEq/L — ABNORMAL HIGH (ref 20.0–24.0)
O2 SAT: 56 %
O2 Saturation: 54 %
PCO2 VEN: 70.4 mmHg — AB (ref 45.0–50.0)
PH VEN: 7.332 — AB (ref 7.250–7.300)
TCO2: 39 mmol/L (ref 0–100)
TCO2: 42 mmol/L (ref 0–100)
pCO2, Ven: 73.6 mmHg (ref 45.0–50.0)
pH, Ven: 7.343 — ABNORMAL HIGH (ref 7.250–7.300)
pO2, Ven: 32 mmHg (ref 30.0–45.0)
pO2, Ven: 33 mmHg (ref 30.0–45.0)

## 2015-01-06 LAB — GLUCOSE, CAPILLARY
Glucose-Capillary: 146 mg/dL — ABNORMAL HIGH (ref 70–99)
Glucose-Capillary: 116 mg/dL — ABNORMAL HIGH (ref 70–99)
Glucose-Capillary: 134 mg/dL — ABNORMAL HIGH (ref 70–99)
Glucose-Capillary: 139 mg/dL — ABNORMAL HIGH (ref 70–99)

## 2015-01-06 LAB — MRSA PCR SCREENING: MRSA by PCR: NEGATIVE

## 2015-01-06 SURGERY — RIGHT HEART CATH
Anesthesia: LOCAL

## 2015-01-06 MED ORDER — FENTANYL CITRATE (PF) 100 MCG/2ML IJ SOLN
INTRAMUSCULAR | Status: AC
  Filled 2015-01-06: qty 2

## 2015-01-06 MED ORDER — DEXTROSE 5 % IV SOLN
120.0000 mg | Freq: Two times a day (BID) | INTRAVENOUS | Status: DC
  Administered 2015-01-07 – 2015-01-09 (×5): 120 mg via INTRAVENOUS
  Filled 2015-01-06 (×7): qty 12

## 2015-01-06 MED ORDER — SODIUM CHLORIDE 0.9 % IJ SOLN: 10.0000 mL | INTRAMUSCULAR | Status: DC | PRN

## 2015-01-06 MED ORDER — SODIUM CHLORIDE 0.9 % IV SOLN: 250.0000 mL | INTRAVENOUS | Status: DC | PRN

## 2015-01-06 MED ORDER — ACETAMINOPHEN 325 MG PO TABS: 650.0000 mg | ORAL_TABLET | ORAL | Status: DC | PRN

## 2015-01-06 MED ORDER — HEPARIN (PORCINE) IN NACL 2-0.9 UNIT/ML-% IJ SOLN
INTRAMUSCULAR | Status: AC
  Filled 2015-01-06: qty 1000

## 2015-01-06 MED ORDER — ONDANSETRON HCL 4 MG/2ML IJ SOLN
4.0000 mg | Freq: Four times a day (QID) | INTRAMUSCULAR | Status: DC | PRN
  Administered 2015-01-06: 4 mg via INTRAVENOUS
  Filled 2015-01-06: qty 2

## 2015-01-06 MED ORDER — MIDAZOLAM HCL 2 MG/2ML IJ SOLN
INTRAMUSCULAR | Status: AC
  Filled 2015-01-06: qty 2

## 2015-01-06 MED ORDER — HEPARIN SODIUM (PORCINE) 5000 UNIT/ML IJ SOLN
5000.0000 [IU] | Freq: Three times a day (TID) | INTRAMUSCULAR | Status: DC
  Administered 2015-01-06 – 2015-01-11 (×15): 5000 [IU] via SUBCUTANEOUS
  Filled 2015-01-06 (×18): qty 1

## 2015-01-06 MED ORDER — POLYETHYLENE GLYCOL 3350 17 G PO PACK
17.0000 g | PACK | Freq: Once | ORAL | Status: AC
  Administered 2015-01-06: 17 g via ORAL
  Filled 2015-01-06: qty 1

## 2015-01-06 MED ORDER — LIDOCAINE HCL (PF) 1 % IJ SOLN
INTRAMUSCULAR | Status: AC
  Filled 2015-01-06: qty 30

## 2015-01-06 MED ORDER — SODIUM CHLORIDE 0.9 % IJ SOLN: 3.0000 mL | INTRAMUSCULAR | Status: DC | PRN

## 2015-01-06 MED ORDER — LIDOCAINE HCL (PF) 1 % IJ SOLN
INTRAMUSCULAR | Status: AC
  Filled 2015-01-06: qty 5

## 2015-01-06 MED ORDER — SODIUM CHLORIDE 0.9 % IJ SOLN
10.0000 mL | Freq: Two times a day (BID) | INTRAMUSCULAR | Status: DC
  Administered 2015-01-06 – 2015-01-10 (×9): 10 mL

## 2015-01-06 MED ORDER — FUROSEMIDE 10 MG/ML IJ SOLN
120.0000 mg | Freq: Two times a day (BID) | INTRAVENOUS | Status: AC
  Administered 2015-01-06 (×2): 120 mg via INTRAVENOUS
  Filled 2015-01-06 (×2): qty 12

## 2015-01-06 MED ORDER — SODIUM CHLORIDE 0.9 % IJ SOLN
3.0000 mL | Freq: Two times a day (BID) | INTRAMUSCULAR | Status: DC
  Administered 2015-01-06 – 2015-01-11 (×9): 3 mL via INTRAVENOUS

## 2015-01-06 NOTE — H&P (View-Only) (Signed)
Patient ID: [name], female   DOB: [birth year], 49 y.o.   MRN: [id number] Advanced Heart Failure Rounding Note   Subjective:    Admitted last night with increased dyspnea from volume overload. Hypertensive in the ED and received IV NTG. Diuresing with 120 mg IV lasix.   Prior to admit weight went up 266 to 273 pounds. Says she was drinking lots of fluids.   [name] diuresed well yesterday though not reflected by weight.   Objective:   Weight Range:  Vital Signs:   Temp:  [97.8 F (36.6 C)-98.5 F (36.9 C)] 98.5 F (36.9 C) (04/27 0432) Pulse Rate:  [70-78] 70 (04/27 0432) Resp:  [18] 18 (04/27 0432) BP: (95-134)/(54-78) 134/71 mmHg (04/27 0432) SpO2:  [93 %-100 %] 98 % (04/27 0432) Weight:  [266 lb 8 oz (120.884 kg)-267 lb 11.2 oz (121.428 kg)] 267 lb 11.2 oz (121.428 kg) (04/27 0432) Last BM Date: 01/04/15  Weight change: Filed Weights   01/04/15 2354 01/05/15 1104 01/06/15 0432  Weight: 267 lb 13.7 oz (121.5 kg) 266 lb 8 oz (120.884 kg) 267 lb 11.2 oz (121.428 kg)    Intake/Output:   Intake/Output Summary (Last 24 hours) at 01/06/15 0830 Last data filed at 01/06/15 0600  Gross per 24 hour  Intake   1603 ml  Output   3550 ml  Net  -1947 ml     Physical Exam: General:  Well appearing. No resp difficulty HEENT: normal Neck: supple. JVP difficult to assess due to body habitus, suspect ~12. Carotids 2+ bilat; no bruits. No lymphadenopathy or thryomegaly appreciated. Cor: PMI nondisplaced. Regular rate & rhythm. No rubs, gallops or murmurs. Lungs: Decreased breath sounds. On 3 liters [place]. Oxygen.  Abdomen: Obese, soft, nontender, nondistended. No hepatosplenomegaly. No bruits or masses. Good bowel sounds. Extremities: no cyanosis, clubbing, rash.  Trace ankle edema. Neuro: alert & orientedx3, cranial nerves grossly intact. moves all 4 extremities w/o difficulty. Affect pleasant  Telemetry: AV paced 70s  Labs: Basic Metabolic Panel:  Recent Labs Lab  12/31/14 0457 01/01/15 0617 01/04/15 1945 01/04/15 1951 01/05/15 0557  NA 135 136 138 138 138  K 3.6 3.9 4.1 4.1 4.2  CL 89* 92* 92* 94* 92*  CO2 35* 34* 32  --  31  GLUCOSE 165* 146* 256* 257* 111*  BUN 28* 24* 23 29* 25*  CREATININE 1.20* 1.22* 1.53* 1.60* 1.28*  CALCIUM 8.8 8.2* 9.3  --  8.9  MG 2.2 2.0 2.1  --   --     Liver Function Tests: No results for input(s): AST, ALT, ALKPHOS, BILITOT, PROT, ALBUMIN in the last 168 hours. No results for input(s): LIPASE, AMYLASE in the last 168 hours. No results for input(s): AMMONIA in the last 168 hours.  CBC:  Recent Labs Lab 01/04/15 1945 01/04/15 1951  WBC 11.4*  --   HGB 16.0* 18.7*  HCT 49.7* 55.0*  MCV 92.7  --   PLT 322  --     Cardiac Enzymes: No results for input(s): CKTOTAL, CKMB, CKMBINDEX, TROPONINI in the last 168 hours.  BNP: BNP (last 3 results)  Recent Labs  12/20/14 2212 01/04/15 1945  BNP 235.5* 214.7*    ProBNP (last 3 results)  Recent Labs  08/04/14 2339 08/21/14 1117  PROBNP 1727.0* 171.9*      Other results:  Imaging: Dg Chest Port 1 View  01/04/2015   CLINICAL DATA:  Shortness of breath 1 day.  EXAM: PORTABLE CHEST - 1 VIEW  COMPARISON:  12/29/2014  and 12/20/2014  FINDINGS: Left-sided pacemaker unchanged. Lungs are hypoinflated with moderate bilateral hazy perihilar opacification likely interstitial edema. Hazy left base opacification unchanged as cannot exclude left-sided effusion, atelectasis or infection. Stable cardiomegaly. Remainder the exam is unchanged.  IMPRESSION: Bilateral perihilar opacification slightly worse likely interstitial edema.  Stable left base opacification which may be due to effusion/atelectasis or infection.  Stable cardiomegaly.   Electronically Signed   By: Elberta Fortis M.D.   On: 01/04/2015 20:17     Medications:     Scheduled Medications: . allopurinol  300 mg Oral Daily  . amiodarone  200 mg Oral Daily  . aspirin EC  81 mg Oral Daily  .  carvedilol  6.25 mg Oral BID WC  . digoxin  0.0625 mg Oral Daily  . ferrous sulfate  325 mg Oral TID  . furosemide  120 mg Intravenous BID  . gabapentin  600 mg Oral QID  . heparin  5,000 Units Subcutaneous 3 times per day  . insulin aspart  0-15 Units Subcutaneous TID WC  . insulin aspart  0-5 Units Subcutaneous QHS  . insulin detemir  100 Units Subcutaneous QHS  . isosorbide-hydrALAZINE  0.5 tablet Oral TID  . ivabradine  2.5 mg Oral BID WC  . losartan  25 mg Oral BID  . magnesium oxide  400 mg Oral Daily  . pantoprazole  40 mg Oral Daily  . polyethylene glycol  17 g Oral Once  . potassium chloride SA  60 mEq Oral BID  . sodium chloride  3 mL Intravenous Q12H  . sodium chloride  3 mL Intravenous Q12H  . spironolactone  25 mg Oral BID  . temazepam  30 mg Oral QHS    Infusions: . sodium chloride 20 mL/hr (01/06/15 0625)    PRN Medications: sodium chloride, sodium chloride, acetaminophen, albuterol, cyclobenzaprine, HYDROcodone-acetaminophen, ondansetron (ZOFRAN) IV, sodium chloride, sodium chloride   Assessment:   1. A/C Systolic HF: Nonischemic cardiomyopathy, s/p [employer] CRT-D. EF 30-35% (11/2014). CPX in 6/15 showed mild cardiac limitation but main issue seemed to be severe ventilatory limitation due to obesity and restrictive physiology. Admitted with volume overload. Had gained 8 pounds since prior discharge likely due increase fluid  Intake. Diuresing with IV lasix 120 mg twice a day.  - Continue current dose of carvedilol, losartan, spiro, and bidil.   - Continue digoxin, check level.  - Continue current IV Lasix today.   - Exam is difficult for volume, plan RHC today.  I discussed risks and benefits with her today and she agrees to proceed.  - Needs  BMET today, re-ordered.  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 K and Mag stable so far this admission. Continue current doses of amiodarone, potassium, magnesium, and  spironolactone.  3. OSA: Using CPAP 4. Morbid Obesity  [name] 01/06/2015 8:30 AM

## 2015-01-06 NOTE — Progress Notes (Signed)
IV team attempted peripheral AC placement in either Right or Left AC, as per request via Dr. [name]; however, even after ultrasound was used, IV Team was unsuccessful with peripheral placement.

## 2015-01-06 NOTE — Plan of Care (Signed)
Problem: Food- and Nutrition-Related Knowledge Deficit (NB-1.1) Goal: Nutrition education Formal process to instruct or train a patient/client in a skill or to impart knowledge to help patients/clients voluntarily manage or modify food choices and eating behavior to maintain or improve health. Outcome: Completed/Met Date Met:  01/06/15 Nutrition Education Note  RD consulted for nutrition education regarding HF and obesity.  RD provided "Heart Failure Nutrition Therapy" as well as "Weight loss tips" handout from the Academy of [employer]. Reviewed patient's dietary recall. Provided examples on ways to decrease sodium intake in diet. Discouraged intake of processed foods and use of salt shaker. Encouraged fresh fruits and vegetables as well as whole grain sources of carbohydrates to maximize fiber intake.   RD discussed why it is important for patient to adhere to diet recommendations, and emphasized the role of fluid restriction, portion sizes, foods to avoid, and importance of weighing self daily. Teach back method used.  Expect good compliance.  Body mass index is 44.55 kg/(m^2). Pt meets criteria for morbid obesity based on current BMI.  Current diet order is heart healthy/carbohydrate modifed, patient is consuming approximately 75-100% of meals at this time. Labs and medications reviewed. No further nutrition interventions warranted at this time. RD contact information provided. If additional nutrition issues arise, please re-consult RD.   Kallie Locks, MS, RD, LDN Pager # [phone] After hours/ weekend pager # [phone]

## 2015-01-06 NOTE — Interval H&P Note (Signed)
History and Physical Interval Note:  01/06/2015 1:34 PM  [name]  has presented today for surgery, with the diagnosis of chf  The various methods of treatment have been discussed with the patient and family. After consideration of risks, benefits and other options for treatment, the patient has consented to  Procedure(s): RIGHT HEART CATH ([name]) as a surgical intervention .  The patient's history has been reviewed, patient examined, no change in status, stable for surgery.  I have reviewed the patient's chart and labs.  Questions were answered to the patient's satisfaction.     [name]

## 2015-01-06 NOTE — Progress Notes (Signed)
Patient ID: [name], female   DOB: 05/06/1966, 49 y.o.   MRN: [id number] Advanced Heart Failure Rounding Note   Subjective:    Admitted last night with increased dyspnea from volume overload. Hypertensive in the ED and received IV NTG. Diuresing with 120 mg IV lasix.   Prior to admit weight went up 266 to 273 pounds. Says she was drinking lots of fluids.   Mrs Study diuresed well yesterday though not reflected by weight.   Objective:   Weight Range:  Vital Signs:   Temp:  [97.8 F (36.6 C)-98.5 F (36.9 C)] 98.5 F (36.9 C) (04/27 0432) Pulse Rate:  [70-78] 70 (04/27 0432) Resp:  [18] 18 (04/27 0432) BP: (95-134)/(54-78) 134/71 mmHg (04/27 0432) SpO2:  [93 %-100 %] 98 % (04/27 0432) Weight:  [266 lb 8 oz (120.884 kg)-267 lb 11.2 oz (121.428 kg)] 267 lb 11.2 oz (121.428 kg) (04/27 0432) Last BM Date: 01/04/15  Weight change: Filed Weights   01/04/15 2354 01/05/15 1104 01/06/15 0432  Weight: 267 lb 13.7 oz (121.5 kg) 266 lb 8 oz (120.884 kg) 267 lb 11.2 oz (121.428 kg)    Intake/Output:   Intake/Output Summary (Last 24 hours) at 01/06/15 0830 Last data filed at 01/06/15 0600  Gross per 24 hour  Intake   1603 ml  Output   3550 ml  Net  -1947 ml     Physical Exam: General:  Well appearing. No resp difficulty HEENT: normal Neck: supple. JVP difficult to assess due to body habitus, suspect ~12. Carotids 2+ bilat; no bruits. No lymphadenopathy or thryomegaly appreciated. Cor: PMI nondisplaced. Regular rate & rhythm. No rubs, gallops or murmurs. Lungs: Decreased breath sounds. On 3 liters [place]. Oxygen.  Abdomen: Obese, soft, nontender, nondistended. No hepatosplenomegaly. No bruits or masses. Good bowel sounds. Extremities: no cyanosis, clubbing, rash.  Trace ankle edema. Neuro: alert & orientedx3, cranial nerves grossly intact. moves all 4 extremities w/o difficulty. Affect pleasant  Telemetry: AV paced 70s  Labs: Basic Metabolic Panel:  Recent Labs Lab  12/31/14 0457 01/01/15 0617 01/04/15 1945 01/04/15 1951 01/05/15 0557  NA 135 136 138 138 138  K 3.6 3.9 4.1 4.1 4.2  CL 89* 92* 92* 94* 92*  CO2 35* 34* 32  --  31  GLUCOSE 165* 146* 256* 257* 111*  BUN 28* 24* 23 29* 25*  CREATININE 1.20* 1.22* 1.53* 1.60* 1.28*  CALCIUM 8.8 8.2* 9.3  --  8.9  MG 2.2 2.0 2.1  --   --     Liver Function Tests: No results for input(s): AST, ALT, ALKPHOS, BILITOT, PROT, ALBUMIN in the last 168 hours. No results for input(s): LIPASE, AMYLASE in the last 168 hours. No results for input(s): AMMONIA in the last 168 hours.  CBC:  Recent Labs Lab 01/04/15 1945 01/04/15 1951  WBC 11.4*  --   HGB 16.0* 18.7*  HCT 49.7* 55.0*  MCV 92.7  --   PLT 322  --     Cardiac Enzymes: No results for input(s): CKTOTAL, CKMB, CKMBINDEX, TROPONINI in the last 168 hours.  BNP: BNP (last 3 results)  Recent Labs  12/20/14 2212 01/04/15 1945  BNP 235.5* 214.7*    ProBNP (last 3 results)  Recent Labs  08/04/14 2339 08/21/14 1117  PROBNP 1727.0* 171.9*      Other results:  Imaging: Dg Chest Port 1 View  01/04/2015   CLINICAL DATA:  Shortness of breath 1 day.  EXAM: PORTABLE CHEST - 1 VIEW  COMPARISON:  12/29/2014   and 12/20/2014  FINDINGS: Left-sided pacemaker unchanged. Lungs are hypoinflated with moderate bilateral hazy perihilar opacification likely interstitial edema. Hazy left base opacification unchanged as cannot exclude left-sided effusion, atelectasis or infection. Stable cardiomegaly. Remainder the exam is unchanged.  IMPRESSION: Bilateral perihilar opacification slightly worse likely interstitial edema.  Stable left base opacification which may be due to effusion/atelectasis or infection.  Stable cardiomegaly.   Electronically Signed   By: Daniel  Boyle M.D.   On: 01/04/2015 20:17     Medications:     Scheduled Medications: . allopurinol  300 mg Oral Daily  . amiodarone  200 mg Oral Daily  . aspirin EC  81 mg Oral Daily  .  carvedilol  6.25 mg Oral BID WC  . digoxin  0.0625 mg Oral Daily  . ferrous sulfate  325 mg Oral TID  . furosemide  120 mg Intravenous BID  . gabapentin  600 mg Oral QID  . heparin  5,000 Units Subcutaneous 3 times per day  . insulin aspart  0-15 Units Subcutaneous TID WC  . insulin aspart  0-5 Units Subcutaneous QHS  . insulin detemir  100 Units Subcutaneous QHS  . isosorbide-hydrALAZINE  0.5 tablet Oral TID  . ivabradine  2.5 mg Oral BID WC  . losartan  25 mg Oral BID  . magnesium oxide  400 mg Oral Daily  . pantoprazole  40 mg Oral Daily  . polyethylene glycol  17 g Oral Once  . potassium chloride SA  60 mEq Oral BID  . sodium chloride  3 mL Intravenous Q12H  . sodium chloride  3 mL Intravenous Q12H  . spironolactone  25 mg Oral BID  . temazepam  30 mg Oral QHS    Infusions: . sodium chloride 20 mL/hr (01/06/15 0625)    PRN Medications: sodium chloride, sodium chloride, acetaminophen, albuterol, cyclobenzaprine, HYDROcodone-acetaminophen, ondansetron (ZOFRAN) IV, sodium chloride, sodium chloride   Assessment:   1. A/C Systolic HF: Nonischemic cardiomyopathy, s/p [employer] CRT-D. EF 30-35% (11/2014). CPX in 6/15 showed mild cardiac limitation but main issue seemed to be severe ventilatory limitation due to obesity and restrictive physiology. Admitted with volume overload. Had gained 8 pounds since prior discharge likely due increase fluid  Intake. Diuresing with IV lasix 120 mg twice a day.  - Continue current dose of carvedilol, losartan, spiro, and bidil.   - Continue digoxin, check level.  - Continue current IV Lasix today.   - Exam is difficult for volume, plan RHC today.  I discussed risks and benefits with her today and she agrees to proceed.  - Needs  BMET today, re-ordered.  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 K and Mag stable so far this admission. Continue current doses of amiodarone, potassium, magnesium, and  spironolactone.  3. OSA: Using CPAP 4. Morbid Obesity  Zilah Villaflor 01/06/2015 8:30 AM  

## 2015-01-06 NOTE — CV Procedure (Signed)
    Cardiac Catheterization Procedure Note  Name: [name] MRN: [id number] DOB: 02-Apr-1966  Procedure: Right Heart Cath  Indication: Left and right heart failure   Procedural Details: The right groin was prepped, draped, and anesthetized with 1% lidocaine. Using the modified Seldinger technique a 7 French sheath was placed in the right femoral vein. A Swan-Ganz catheter was used for the right heart catheterization. Standard protocol was followed for recording of right heart pressures and sampling of oxygen saturations. Fick and thermodilution cardiac output was calculated. There were no immediate procedural complications. The patient was transferred to the post catheterization recovery area for further monitoring.  Procedural Findings: Hemodynamics (mmHg) RA mean 18 RV 66/22 PA 66/34, mean 48 PCWP mean 28  Oxygen saturations: PA 55% AO 93%  Cardiac Output (Fick) 3.77  Cardiac Index (Fick) 1.8 PVR 5.3 WU  Cardiac Output (Thermo) 4.77 Cardiac Index (Thermo) 2.26  PVR 4.2 WU  Impression: Left and right heart filling pressures are very elevated.  Marginal cardiac output.  For now, will continue diuresis.  I am going to place a PICC and follow co-ox and CVP.  Will hold off on milrinone for the time being, may be needed if creatinine rises/diuresis is difficult.   [name] 01/06/2015, 2:08 PM

## 2015-01-06 NOTE — Progress Notes (Signed)
Pt transferred to [facility], 2H.  Report was given to receiving nurse, 2H, Room 15.  Pt in no acute distress.

## 2015-01-06 NOTE — Progress Notes (Signed)
Peripherally Inserted Central Catheter/Midline Placement  The IV Nurse has discussed with the patient and/or persons authorized to consent for the patient, the purpose of this procedure and the potential benefits and risks involved with this procedure.  The benefits include less needle sticks, lab draws from the catheter and patient may be discharged home with the catheter.  Risks include, but not limited to, infection, bleeding, blood clot (thrombus formation), and puncture of an artery; nerve damage and irregular heat beat.  Alternatives to this procedure were also discussed.  Consent obtained by Stacie Glaze, RN  PICC/Midline Placement Documentation        [name], [name] 01/06/2015, 5:41 PM

## 2015-01-06 NOTE — Care Management Note (Addendum)
    Page 1 of 2   01/11/2015     1:36:14 PM CARE MANAGEMENT NOTE 01/11/2015  Patient:  [name], [name]   Account Number:  [id number]  Date Initiated:  01/06/2015  Documentation initiated by:  Donyell Carrell  Subjective/Objective Assessment:   Pt adm on 01/04/15 with CHF exacerbation.  PTA, pt resides at home with daughter and fiance, and is independent.     Action/Plan:   Will follow for home needs as pt progresses.   Anticipated DC Date:  01/11/2015   Anticipated DC Plan:  HOME W HOME HEALTH SERVICES      DC Planning Services  CM consult      [facility] Choice  HOME HEALTH   Choice offered to / List presented to:  C-1 Patient        HH arranged  HH-10 DISEASE MANAGEMENT  HH-1 RN  HH-2 PT      HH agency  [facility]  [facility]   Status of service:  Completed, signed off Medicare Important Message given?  YES (If response is "NO", the following Medicare IM given date fields will be blank) Date Medicare IM given:  01/07/2015 Medicare IM given by:  Junius Creamer Date Additional Medicare IM given:  01/11/2015 Additional Medicare IM given by:  Sidney Ace  Discharge Disposition:  HOME [facility] SERVICES  Per UR Regulation:  Reviewed for med. necessity/level of care/duration of stay  If discussed at Long Length of Stay Meetings, dates discussed:    Comments:  01/11/15 Sidney Ace, RN, BSN [phone] Pt for dc home today with fiance.  Referral to [facility], per pt choice.  Start of care 24-48h post dc date.  No DME needs, per pt.  4/29 1116 debbie dowell rn,bsn pt to be followed by [facility] for community rn. hhpt will be set up w [facility]. pt has used [facility] in past and would like them again. hhrn and hhpt w [facility] also arranged. pt states she has a rolling walker.

## 2015-01-07 ENCOUNTER — Inpatient Hospital Stay (HOSPITAL_COMMUNITY): Admission: RE | Admit: 2015-01-07 | Payer: Medicare Other | Source: Ambulatory Visit

## 2015-01-07 DIAGNOSIS — N182 Chronic kidney disease, stage 2 (mild): Secondary | ICD-10-CM

## 2015-01-07 LAB — CBC
HCT: 42.8 % (ref 36.0–46.0)
HEMOGLOBIN: 13.3 g/dL (ref 12.0–15.0)
MCH: 29 pg (ref 26.0–34.0)
MCHC: 31.1 g/dL (ref 30.0–36.0)
MCV: 93.4 fL (ref 78.0–100.0)
Platelets: 272 10*3/uL (ref 150–400)
RBC: 4.58 MIL/uL (ref 3.87–5.11)
RDW: 13.9 % (ref 11.5–15.5)
WBC: 5.4 10*3/uL (ref 4.0–10.5)

## 2015-01-07 LAB — BASIC METABOLIC PANEL
Anion gap: 8 (ref 5–15)
BUN: 28 mg/dL — ABNORMAL HIGH (ref 6–23)
CALCIUM: 8.7 mg/dL (ref 8.4–10.5)
CO2: 40 mmol/L (ref 19–32)
CREATININE: 1.31 mg/dL — AB (ref 0.50–1.10)
Chloride: 89 mmol/L — ABNORMAL LOW (ref 96–112)
GFR calc Af Amer: 55 mL/min — ABNORMAL LOW (ref >90)
GFR calc non Af Amer: 47 mL/min — ABNORMAL LOW (ref >90)
GLUCOSE: 151 mg/dL — AB (ref 70–99)
Potassium: 5.1 mmol/L (ref 3.5–5.1)
SODIUM: 137 mmol/L (ref 135–145)

## 2015-01-07 LAB — GLUCOSE, CAPILLARY
GLUCOSE-CAPILLARY: 233 mg/dL — AB (ref 70–99)
Glucose-Capillary: 137 mg/dL — ABNORMAL HIGH (ref 70–99)
Glucose-Capillary: 117 mg/dL — ABNORMAL HIGH (ref 70–99)
Glucose-Capillary: 186 mg/dL — ABNORMAL HIGH (ref 70–99)

## 2015-01-07 LAB — CARBOXYHEMOGLOBIN
Carboxyhemoglobin: 1.5 % (ref 0.5–1.5)
Methemoglobin: 1.2 % (ref 0.0–1.5)
O2 SAT: 75.5 %
Total hemoglobin: 13.6 g/dL (ref 12.0–16.0)

## 2015-01-07 MED ORDER — METOLAZONE 2.5 MG PO TABS
2.5000 mg | ORAL_TABLET | Freq: Once | ORAL | Status: AC
  Administered 2015-01-07: 2.5 mg via ORAL
  Filled 2015-01-07: qty 1

## 2015-01-07 MED ORDER — INSULIN ASPART 100 UNIT/ML ~~LOC~~ SOLN
0.0000 [IU] | Freq: Three times a day (TID) | SUBCUTANEOUS | Status: DC
  Administered 2015-01-07: 5 [IU] via SUBCUTANEOUS
  Administered 2015-01-07: 2 [IU] via SUBCUTANEOUS
  Administered 2015-01-08: 3 [IU] via SUBCUTANEOUS
  Administered 2015-01-08 (×2): 2 [IU] via SUBCUTANEOUS
  Administered 2015-01-09: 5 [IU] via SUBCUTANEOUS
  Administered 2015-01-09: 2 [IU] via SUBCUTANEOUS
  Administered 2015-01-09: 3 [IU] via SUBCUTANEOUS
  Administered 2015-01-10: 2 [IU] via SUBCUTANEOUS
  Administered 2015-01-10: 5 [IU] via SUBCUTANEOUS
  Administered 2015-01-10: 2 [IU] via SUBCUTANEOUS

## 2015-01-07 NOTE — Progress Notes (Signed)
Patient ID: [name], female   DOB: October 22, 1965, 49 y.o.   MRN: [id number] Advanced Heart Failure Rounding Note   Subjective:    Admitted with increased dyspnea from volume overload. Hypertensive in the ED and received IV NTG. Diuresing with 120 mg IV lasix.   [name] appeared to diurese reasonably well but not reflected by weight loss.  Creatinine remains fairly stable.  CVP 17, co-ox 75% this morning.   RHC (4/27):  RA mean 18 RV 66/22 PA 66/34, mean 48 PCWP mean 28 Oxygen saturations: PA 55% AO 93% Cardiac Output (Fick) 3.77  Cardiac Index (Fick) 1.8 PVR 5.3 WU Cardiac Output (Thermo) 4.77 Cardiac Index (Thermo) 2.26 PVR 4.2 WU  Objective:   Weight Range:  Vital Signs:   Temp:  [98 F (36.7 C)-99.7 F (37.6 C)] 98.7 F (37.1 C) (04/28 0400) Pulse Rate:  [70-97] 70 (04/28 0600) Resp:  [16-24] 24 (04/28 0600) BP: (96-137)/(51-82) 105/60 mmHg (04/28 0600) SpO2:  [93 %-100 %] 98 % (04/28 0600) Weight:  [271 lb 13.2 oz (123.3 kg)] 271 lb 13.2 oz (123.3 kg) (04/28 0500) Last BM Date: 01/06/15  Weight change: Filed Weights   01/05/15 1104 01/06/15 0432 01/07/15 0500  Weight: 266 lb 8 oz (120.884 kg) 267 lb 11.2 oz (121.428 kg) 271 lb 13.2 oz (123.3 kg)    Intake/Output:   Intake/Output Summary (Last 24 hours) at 01/07/15 0640 Last data filed at 01/07/15 0600  Gross per 24 hour  Intake 1311.17 ml  Output   3100 ml  Net -1788.83 ml     Physical Exam: General:  Well appearing. No resp difficulty HEENT: normal Neck: supple. JVP difficult to assess due to body habitus, suspect ~12. Carotids 2+ bilat; no bruits. No lymphadenopathy or thryomegaly appreciated. Cor: PMI nondisplaced. Regular rate & rhythm. No rubs, gallops or murmurs. Lungs: Decreased breath sounds. On 3 liters [place]. Oxygen.  Abdomen: Obese, soft, nontender, nondistended. No hepatosplenomegaly. No bruits or masses. Good bowel sounds. Extremities: no cyanosis, clubbing, rash.  Trace ankle  edema. Neuro: alert & orientedx3, cranial nerves grossly intact. moves all 4 extremities w/o difficulty. Affect pleasant  Telemetry: AV paced 70s  Labs: Basic Metabolic Panel:  Recent Labs Lab 01/01/15 0617 01/04/15 1945 01/04/15 1951 01/05/15 0557 01/06/15 0835 01/07/15 0350  NA 136 138 138 138 136 137  K 3.9 4.1 4.1 4.2 4.4 5.1  CL 92* 92* 94* 92* 89* 89*  CO2 34* 32  --  31 33* 40*  GLUCOSE 146* 256* 257* 111* 123* 151*  BUN 24* 23 29* 25* 33* 28*  CREATININE 1.22* 1.53* 1.60* 1.28* 1.28* 1.31*  CALCIUM 8.2* 9.3  --  8.9 9.2 8.7  MG 2.0 2.1  --   --   --   --     Liver Function Tests: No results for input(s): AST, ALT, ALKPHOS, BILITOT, PROT, ALBUMIN in the last 168 hours. No results for input(s): LIPASE, AMYLASE in the last 168 hours. No results for input(s): AMMONIA in the last 168 hours.  CBC:  Recent Labs Lab 01/04/15 1945 01/04/15 1951 01/06/15 0835 01/07/15 0350  WBC 11.4*  --  5.9 5.4  HGB 16.0* 18.7* 14.4 13.3  HCT 49.7* 55.0* 44.8 42.8  MCV 92.7  --  92.8 93.4  PLT 322  --  267 272    Cardiac Enzymes: No results for input(s): CKTOTAL, CKMB, CKMBINDEX, TROPONINI in the last 168 hours.  BNP: BNP (last 3 results)  Recent Labs  12/20/14 2212 01/04/15 1945  BNP 235.5* 214.7*    ProBNP (last 3 results)  Recent Labs  08/04/14 2339 08/21/14 1117  PROBNP 1727.0* 171.9*      Other results:  Imaging: No results found.   Medications:     Scheduled Medications: . allopurinol  300 mg Oral Daily  . amiodarone  200 mg Oral Daily  . aspirin EC  81 mg Oral Daily  . carvedilol  6.25 mg Oral BID WC  . digoxin  0.0625 mg Oral Daily  . ferrous sulfate  325 mg Oral TID  . furosemide  120 mg Intravenous BID  . gabapentin  600 mg Oral QID  . heparin  5,000 Units Subcutaneous 3 times per day  . insulin aspart  0-15 Units Subcutaneous TID WC  . insulin aspart  0-5 Units Subcutaneous QHS  . insulin detemir  100 Units Subcutaneous QHS  .  isosorbide-hydrALAZINE  0.5 tablet Oral TID  . ivabradine  2.5 mg Oral BID WC  . lidocaine (PF)      . losartan  25 mg Oral BID  . magnesium oxide  400 mg Oral Daily  . metolazone  2.5 mg Oral Once  . pantoprazole  40 mg Oral Daily  . potassium chloride SA  60 mEq Oral BID  . sodium chloride  10-40 mL Intracatheter Q12H  . sodium chloride  3 mL Intravenous Q12H  . sodium chloride  3 mL Intravenous Q12H  . spironolactone  25 mg Oral BID  . temazepam  30 mg Oral QHS    Infusions:    PRN Medications: sodium chloride, sodium chloride, acetaminophen, albuterol, cyclobenzaprine, HYDROcodone-acetaminophen, ondansetron (ZOFRAN) IV, sodium chloride, sodium chloride, sodium chloride   Assessment:   1. A/C Systolic HF: Nonischemic cardiomyopathy, s/p [employer] CRT-D. EF 30-35% (11/2014). CPX in 6/15 showed mild cardiac limitation but main issue seemed to be severe ventilatory limitation due to obesity and restrictive physiology. Admitted with volume overload. Had gained 8 pounds since prior discharge likely due increase fluid  Intake. Diuresing with IV lasix 120 mg twice a day. RHC 4/27 showed ongoing volume overload and marginal cardiac output.  CVP 17 today and co-ox looks better, 75%.  She is not on inotropes.  - Continue current dose of carvedilol, losartan, spiro, and bidil.   - Continue digoxin, level ok.  - Continue current IV Lasix today but will add a dose of metolazone 2.5 mg x 1.   - Continue to follow co-ox and CVP.  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 K and Mag stable so far this admission. Continue current doses of amiodarone, potassium, magnesium, and spironolactone.  3. OSA: Using CPAP 4. Morbid Obesity 5. CKD: Follow creatinine closely.  6. Needs to get out of bed.   [name] 01/07/2015 6:40 AM

## 2015-01-07 NOTE — Progress Notes (Signed)
CARDIAC REHAB PHASE I   PRE:  Rate/Rhythm: 70 paced    BP: sitting 92/56    SaO2: 96 1L  MODE:  Ambulation: 170 ft   POST:  Rate/Rhythm: 91 paced    BP: sitting 93/44     SaO2: 97 2L  Pt motivated to walk. Able to stand independently. Used RW and 2L. C/o jerking in hands and legs since her VT and shocks last week.  Legs buckled/jerked x1. Overall did well. To recliner after walk, SaO2 good on 2L. Will f/u. 1220-1240  [name] [place] CES, ACSM 01/07/2015 12:39 PM

## 2015-01-07 NOTE — Patient Outreach (Signed)
Updated inpatient that patient is active with [facility] Care Management and will plan to continue to follow progress and disposition needs. Charlesetta Shanks, RN BSN CCM [facility]  [phone] business mobile phone

## 2015-01-07 NOTE — Evaluation (Signed)
Physical Therapy Evaluation Patient Details Name: [name] MRN: [id number] DOB: [birth year] Today's Date: 01/07/2015   History of Present Illness  49 yo admitted 4/25 with increased dyspnea from volume overload. PMH: NICM, ICD with discharges, HTN, DM, OSA  Clinical Impression  Pt very pleasant lady who reports that since she has had ICD discharges she has had decreased strength and function. Pt with bil LE 3+/5 and educated for HEP with encouragement to perform daily acutely and at D/C. Pt able to maintain sats 93% on RA with gait but seated sats continue to drop to 89% on RA with 95% on 1L. Pt educated for breathing technique. Pt will benefit from acute therapy to maximize mobility, safety and gait to decrease burden of care and address all below deficits (PT problem list).     Follow Up Recommendations Home health PT    Equipment Recommendations  Rolling walker with 5" wheels (pt recently received RW but would benefit from rollator instead)    Recommendations for Other Services       Precautions / Restrictions Precautions Precautions: Fall Restrictions Weight Bearing Restrictions: No      Mobility  Bed Mobility Overal bed mobility: Modified Independent                Transfers Overall transfer level: Needs assistance   Transfers: Sit to/from Stand Sit to Stand: Supervision         General transfer comment: cues for hand placement  Ambulation/Gait Ambulation/Gait assistance: Min guard Ambulation Distance (Feet): 120 Feet Assistive device: Rolling walker (2 wheeled) Gait Pattern/deviations: Step-through pattern;Decreased stride length   Gait velocity interpretation: Below normal speed for age/gender General Gait Details: slow gait with standing rest x 1 with partial buckling of RLE x 1 during gait. Cues for position in RW and safety  Stairs            Wheelchair Mobility    Modified Rankin (Stroke Patients Only)       Balance Overall  balance assessment: Needs assistance   Sitting balance-[name]: Good       Standing balance-[name]: Poor                               Pertinent Vitals/Pain Pain Assessment: No/denies pain  HR 70 107/64    Home Living Family/patient expects to be discharged to:: Private residence Living Arrangements: Children;Non-relatives/Friends Available Help at Discharge: Family;Available 24 hours/day Type of Home: Apartment Home Access: Stairs to enter   Entrance Stairs-Number of Steps: 1 Home Layout: One level Home Equipment: Walker - 2 wheels;Cane - single point;Tub bench      Prior Function Level of Independence: Needs assistance   Gait / Transfers Assistance Needed: pt uses a RW performs ADLS  ADL's / Homemaking Assistance Needed: fiance does homemaking, scooter for grocery store  Comments: Lives with 14 daughter and fiance'. Marchia Meiers' is self employed and able to assist as needed.      Hand Dominance        Extremity/Trunk Assessment   Upper Extremity Assessment: Defer to OT evaluation           Lower Extremity Assessment: Generalized weakness      Cervical / Trunk Assessment: Normal  Communication   Communication: No difficulties  Cognition Arousal/Alertness: Awake/alert Behavior During Therapy: WFL for tasks assessed/performed Overall Cognitive Status: Within Functional Limits for tasks assessed  General Comments      Exercises General Exercises - Lower Extremity Long Arc Quad: AROM;Seated;Both;20 reps Hip Flexion/Marching: AROM;Seated;Both;20 reps      Assessment/Plan    PT Assessment Patient needs continued PT services  PT Diagnosis Difficulty walking;Generalized weakness   PT Problem List Decreased strength;Decreased activity tolerance;Decreased balance;Decreased knowledge of use of DME;Obesity  PT Treatment Interventions Gait training;DME instruction;Functional mobility training;Therapeutic  activities;Therapeutic exercise;Patient/family education   PT Goals (Current goals can be found in the Care Plan section) Acute Rehab PT Goals Patient Stated Goal: be able to walk in the grocery store PT Goal Formulation: With patient Time For Goal Achievement: 01/21/15 Potential to Achieve Goals: Good    Frequency Min 3X/week   Barriers to discharge        Co-evaluation               End of Session   Activity Tolerance: Patient tolerated treatment well Patient left: in chair;with call bell/phone within reach Nurse Communication: Mobility status;Precautions         Time: 9295-7473 PT Time Calculation (min) (ACUTE ONLY): 25 min   Charges:   PT Evaluation $Initial PT Evaluation Tier I: 1 Procedure PT Treatments $Therapeutic Exercise: 8-22 mins   PT G Codes:        Delorse Lek 01/07/2015, 11:00 AM Delaney Meigs, PT [phone]

## 2015-01-08 ENCOUNTER — Ambulatory Visit: Payer: Self-pay | Admitting: *Deleted

## 2015-01-08 ENCOUNTER — Encounter (HOSPITAL_COMMUNITY): Payer: Medicare Other

## 2015-01-08 ENCOUNTER — Encounter: Payer: Self-pay | Admitting: *Deleted

## 2015-01-08 DIAGNOSIS — N179 Acute kidney failure, unspecified: Secondary | ICD-10-CM

## 2015-01-08 LAB — BASIC METABOLIC PANEL
Anion gap: 9 (ref 5–15)
BUN: 30 mg/dL — ABNORMAL HIGH (ref 6–23)
CALCIUM: 9 mg/dL (ref 8.4–10.5)
CO2: 39 mmol/L — AB (ref 19–32)
Chloride: 88 mmol/L — ABNORMAL LOW (ref 96–112)
Creatinine, Ser: 1.47 mg/dL — ABNORMAL HIGH (ref 0.50–1.10)
GFR calc Af Amer: 48 mL/min — ABNORMAL LOW (ref >90)
GFR, EST NON AFRICAN AMERICAN: 41 mL/min — AB (ref >90)
Glucose, Bld: 130 mg/dL — ABNORMAL HIGH (ref 70–99)
Potassium: 4.6 mmol/L (ref 3.5–5.1)
SODIUM: 136 mmol/L (ref 135–145)

## 2015-01-08 LAB — CARBOXYHEMOGLOBIN
Carboxyhemoglobin: 1.1 % (ref 0.5–1.5)
Methemoglobin: 1 % (ref 0.0–1.5)
O2 Saturation: 66.5 %
TOTAL HEMOGLOBIN: 14.2 g/dL (ref 12.0–16.0)

## 2015-01-08 LAB — GLUCOSE, CAPILLARY
Glucose-Capillary: 130 mg/dL — ABNORMAL HIGH (ref 70–99)
Glucose-Capillary: 187 mg/dL — ABNORMAL HIGH (ref 70–99)
Glucose-Capillary: 142 mg/dL — ABNORMAL HIGH (ref 70–99)
Glucose-Capillary: 194 mg/dL — ABNORMAL HIGH (ref 70–99)

## 2015-01-08 MED ORDER — METOLAZONE 2.5 MG PO TABS
2.5000 mg | ORAL_TABLET | Freq: Once | ORAL | Status: AC
  Administered 2015-01-08: 2.5 mg via ORAL
  Filled 2015-01-08: qty 1

## 2015-01-08 NOTE — Progress Notes (Signed)
CARDIAC REHAB PHASE I   PRE:  Rate/Rhythm: paced 70  BP:  Supine:   Sitting: 110/49  Standing:    SaO2: 97% 1L  MODE:  Ambulation: 120 ft   POST:  Rate/Rhythm: 75  BP:  Supine:   Sitting: 112/60  Standing:    SaO2: 98% 2L 1440-1515 Pt's legs buckling before we left room. Pt used rolling walker and had gait belt on when walking. Asked for assistance in hall as pt unsteady with buckling of legs. Walked 120 ft resting at 60 ft. Used oxygen at 2L for walk. Tired by end of walk. Needs to be assist x2 due to equipment and buckling of legs. Motivated to walk.   Luetta Nutting, RN BSN  01/08/2015 3:12 PM

## 2015-01-08 NOTE — Clinical Documentation Improvement (Signed)
RD assessment 01/06/2015, indicates pt.has BMI 44.55 and meets criteria for morbid obesity based on current BMI.  Coding BMI is an exception to the guideline that requires that code assignment be based on the documentation by the provider. Pt.   ht 5'5"  Current wt: 268 lbs  Please document in notes if below indicates pt. Condition.  . Obesity --Morbid (severe) Due to excess calories  . Body Mass Index (BMI)=44.5  Supporting Information: refer to RD assessment on 01/06/2015  Thank You,  Elpidio Anis, RN, BSN, CDI [phone] [facility] HIM Dept.

## 2015-01-08 NOTE — Progress Notes (Signed)
Patient ID: [name], female   DOB: 26-Dec-1965, 49 y.o.   MRN: [id number] Advanced Heart Failure Rounding Note   Subjective:    Admitted with increased dyspnea from volume overload. Hypertensive in the ED and received IV NTG. Diuresing with 120 mg IV lasix.   Good diuresis yesterday with metolazone dose.  CVP 15-16 this morning.  Co-ox 67%.  Creatinine up a bit.   RHC (4/27):  RA mean 18 RV 66/22 PA 66/34, mean 48 PCWP mean 28 Oxygen saturations: PA 55% AO 93% Cardiac Output (Fick) 3.77  Cardiac Index (Fick) 1.8 PVR 5.3 WU Cardiac Output (Thermo) 4.77 Cardiac Index (Thermo) 2.26 PVR 4.2 WU  Objective:   Weight Range:  Vital Signs:   Temp:  [97.6 F (36.4 C)-98.4 F (36.9 C)] 97.8 F (36.6 C) (04/29 0700) Pulse Rate:  [69-71] 71 (04/29 0700) Resp:  [0-22] 18 (04/29 0700) BP: (83-116)/(28-90) 116/79 mmHg (04/29 0700) SpO2:  [94 %-100 %] 100 % (04/29 0700) Weight:  [268 lb (121.564 kg)] 268 lb (121.564 kg) (04/29 0300) Last BM Date: 01/06/15  Weight change: Filed Weights   01/06/15 0432 01/07/15 0500 01/08/15 0300  Weight: 267 lb 11.2 oz (121.428 kg) 271 lb 13.2 oz (123.3 kg) 268 lb (121.564 kg)    Intake/Output:   Intake/Output Summary (Last 24 hours) at 01/08/15 0727 Last data filed at 01/08/15 0600  Gross per 24 hour  Intake   1564 ml  Output   4675 ml  Net  -3111 ml     Physical Exam: General:  Well appearing. No resp difficulty HEENT: normal Neck: supple. JVP difficult to assess due to body habitus, suspect ~12. Carotids 2+ bilat; no bruits. No lymphadenopathy or thryomegaly appreciated. Cor: PMI nondisplaced. Regular rate & rhythm. No rubs, gallops or murmurs. Lungs: Decreased breath sounds. On 3 liters [place]. Oxygen.  Abdomen: Obese, soft, nontender, nondistended. No hepatosplenomegaly. No bruits or masses. Good bowel sounds. Extremities: no cyanosis, clubbing, rash.  No edema. Neuro: alert & orientedx3, cranial nerves grossly intact. moves all 4  extremities w/o difficulty. Affect pleasant  Telemetry: A-sensed v-paced 70s  Labs: Basic Metabolic Panel:  Recent Labs Lab 01/04/15 1945 01/04/15 1951 01/05/15 0557 01/06/15 0835 01/07/15 0350 01/08/15 0336  NA 138 138 138 136 137 136  K 4.1 4.1 4.2 4.4 5.1 4.6  CL 92* 94* 92* 89* 89* 88*  CO2 32  --  31 33* 40* 39*  GLUCOSE 256* 257* 111* 123* 151* 130*  BUN 23 29* 25* 33* 28* 30*  CREATININE 1.53* 1.60* 1.28* 1.28* 1.31* 1.47*  CALCIUM 9.3  --  8.9 9.2 8.7 9.0  MG 2.1  --   --   --   --   --     Liver Function Tests: No results for input(s): AST, ALT, ALKPHOS, BILITOT, PROT, ALBUMIN in the last 168 hours. No results for input(s): LIPASE, AMYLASE in the last 168 hours. No results for input(s): AMMONIA in the last 168 hours.  CBC:  Recent Labs Lab 01/04/15 1945 01/04/15 1951 01/06/15 0835 01/07/15 0350  WBC 11.4*  --  5.9 5.4  HGB 16.0* 18.7* 14.4 13.3  HCT 49.7* 55.0* 44.8 42.8  MCV 92.7  --  92.8 93.4  PLT 322  --  267 272    Cardiac Enzymes: No results for input(s): CKTOTAL, CKMB, CKMBINDEX, TROPONINI in the last 168 hours.  BNP: BNP (last 3 results)  Recent Labs  12/20/14 2212 01/04/15 1945  BNP 235.5* 214.7*    ProBNP (  last 3 results)  Recent Labs  08/04/14 2339 08/21/14 1117  PROBNP 1727.0* 171.9*      Other results:  Imaging: No results found.   Medications:     Scheduled Medications: . allopurinol  300 mg Oral Daily  . amiodarone  200 mg Oral Daily  . aspirin EC  81 mg Oral Daily  . carvedilol  6.25 mg Oral BID WC  . digoxin  0.0625 mg Oral Daily  . ferrous sulfate  325 mg Oral TID  . furosemide  120 mg Intravenous BID  . gabapentin  600 mg Oral QID  . heparin  5,000 Units Subcutaneous 3 times per day  . insulin aspart  0-15 Units Subcutaneous TID WC  . insulin aspart  0-5 Units Subcutaneous QHS  . insulin detemir  100 Units Subcutaneous QHS  . isosorbide-hydrALAZINE  0.5 tablet Oral TID  . ivabradine  2.5 mg Oral  BID WC  . losartan  25 mg Oral BID  . magnesium oxide  400 mg Oral Daily  . metolazone  2.5 mg Oral Once  . pantoprazole  40 mg Oral Daily  . potassium chloride SA  60 mEq Oral BID  . sodium chloride  10-40 mL Intracatheter Q12H  . sodium chloride  3 mL Intravenous Q12H  . sodium chloride  3 mL Intravenous Q12H  . spironolactone  25 mg Oral BID  . temazepam  30 mg Oral QHS    Infusions:    PRN Medications: sodium chloride, sodium chloride, acetaminophen, albuterol, cyclobenzaprine, HYDROcodone-acetaminophen, ondansetron (ZOFRAN) IV, sodium chloride, sodium chloride, sodium chloride   Assessment:   1. A/C Systolic HF: Nonischemic cardiomyopathy, s/p [employer] CRT-D. EF 30-35% (11/2014). CPX in 6/15 showed mild cardiac limitation but main issue seemed to be severe ventilatory limitation due to obesity and restrictive physiology. Admitted with volume overload. Had gained 8 pounds since prior discharge likely due increase fluid  Intake. Diuresing with IV lasix 120 mg twice a day, good diuresis yesterday with addition of a dose of metolazone. RHC 4/27 showed ongoing volume overload and marginal cardiac output.  CVP 15-16 today and co-ox 67%.  She is not on inotropes.  - Continue current dose of carvedilol, losartan, spiro, and bidil.   - Continue digoxin, level ok.  - Continue current IV Lasix today but will again add a dose of metolazone 2.5 mg x 1.   - Continue to follow co-ox and CVP.  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 K and Mag stable so far this admission. Continue current doses of amiodarone, potassium, magnesium, and spironolactone.  3. OSA: Uses CPAP, need to order for use here.  4. Morbid Obesity 5. CKD: Follow creatinine closely, mild rise today.  6. Needs to get out of bed/ambulate, will need home PT.   [name] 01/08/2015 7:27 AM

## 2015-01-08 NOTE — Progress Notes (Signed)
Noted with blood tinge urine in the foley cath., pt on heparin. MD aware and gave order to monitor. To continue with heparin sub-q.

## 2015-01-08 NOTE — Patient Outreach (Signed)
[facility]) Care Management  01/08/2015  [name] 07-May-1966 [id number]   Continue to follow [name]'s hospital progress with [facility] Liaison. I will call [name] upon discharge for transition of care assessment with plans to schedule face to face visit to initiate community care management and disease management services.   [name] MHA,BSN,RN,CCM [facility] Care Management  [phone]

## 2015-01-09 LAB — BASIC METABOLIC PANEL
Anion gap: 11 (ref 5–15)
BUN: 32 mg/dL — ABNORMAL HIGH (ref 6–23)
CALCIUM: 8.8 mg/dL (ref 8.4–10.5)
CO2: 37 mmol/L — ABNORMAL HIGH (ref 19–32)
CREATININE: 1.37 mg/dL — AB (ref 0.50–1.10)
Chloride: 88 mmol/L — ABNORMAL LOW (ref 96–112)
GFR calc non Af Amer: 45 mL/min — ABNORMAL LOW (ref >90)
GFR, EST AFRICAN AMERICAN: 52 mL/min — AB (ref >90)
Glucose, Bld: 124 mg/dL — ABNORMAL HIGH (ref 70–99)
Potassium: 4.5 mmol/L (ref 3.5–5.1)
Sodium: 136 mmol/L (ref 135–145)

## 2015-01-09 LAB — AMMONIA: Ammonia: 44 umol/L — ABNORMAL HIGH (ref 11–32)

## 2015-01-09 LAB — GLUCOSE, CAPILLARY
GLUCOSE-CAPILLARY: 204 mg/dL — AB (ref 70–99)
Glucose-Capillary: 159 mg/dL — ABNORMAL HIGH (ref 70–99)
Glucose-Capillary: 139 mg/dL — ABNORMAL HIGH (ref 70–99)
Glucose-Capillary: 202 mg/dL — ABNORMAL HIGH (ref 70–99)

## 2015-01-09 LAB — CARBOXYHEMOGLOBIN
CARBOXYHEMOGLOBIN: 1.2 % (ref 0.5–1.5)
Methemoglobin: 1.2 % (ref 0.0–1.5)
O2 SAT: 57.1 %
TOTAL HEMOGLOBIN: 15.2 g/dL (ref 12.0–16.0)

## 2015-01-09 MED ORDER — TORSEMIDE 20 MG PO TABS
80.0000 mg | ORAL_TABLET | Freq: Every day | ORAL | Status: DC
  Administered 2015-01-10: 80 mg via ORAL
  Filled 2015-01-09 (×2): qty 4

## 2015-01-09 MED ORDER — AMIODARONE HCL 100 MG PO TABS
100.0000 mg | ORAL_TABLET | Freq: Every day | ORAL | Status: DC
  Administered 2015-01-10 – 2015-01-11 (×2): 100 mg via ORAL
  Filled 2015-01-09 (×2): qty 1

## 2015-01-09 MED ORDER — IVABRADINE HCL 5 MG PO TABS
5.0000 mg | ORAL_TABLET | Freq: Two times a day (BID) | ORAL | Status: DC
  Administered 2015-01-09 – 2015-01-11 (×4): 5 mg via ORAL
  Filled 2015-01-09 (×6): qty 1

## 2015-01-09 NOTE — Progress Notes (Signed)
Patient ID: [name], female   DOB: 1966-07-12, 49 y.o.   MRN: [id number] Advanced Heart Failure Rounding Note   Subjective:    Admitted with increased dyspnea from volume overload. Hypertensive in the ED and received IV NTG. Diuresing with 120 mg IV lasix.   Remains on lasix 120 bid IV and metoalzone. Good diuresis again. Weight down another 2 pounds.  CVP 6 this morning.  Co-ox down to 57%.  Creatinine stable. Walking with PT and CR but complains of leg and arm jerking.   RHC (4/27):  RA mean 18 RV 66/22 PA 66/34, mean 48 PCWP mean 28 Oxygen saturations: PA 55% AO 93% Cardiac Output (Fick) 3.77  Cardiac Index (Fick) 1.8 PVR 5.3 WU Cardiac Output (Thermo) 4.77 Cardiac Index (Thermo) 2.26 PVR 4.2 WU  Objective:   Weight Range:  Vital Signs:   Temp:  [97.6 F (36.4 C)-98.2 F (36.8 C)] 97.8 F (36.6 C) (04/30 1131) Pulse Rate:  [70-71] 70 (04/30 1131) Resp:  [15-22] 21 (04/30 1131) BP: (89-110)/(33-59) 99/48 mmHg (04/30 1131) SpO2:  [89 %-100 %] 100 % (04/30 1131) Weight:  [120.929 kg (266 lb 9.6 oz)] 120.929 kg (266 lb 9.6 oz) (04/30 0400) Last BM Date: 01/06/15  Weight change: Filed Weights   01/07/15 0500 01/08/15 0300 01/09/15 0400  Weight: 123.3 kg (271 lb 13.2 oz) 121.564 kg (268 lb) 120.929 kg (266 lb 9.6 oz)    Intake/Output:   Intake/Output Summary (Last 24 hours) at 01/09/15 1317 Last data filed at 01/09/15 1000  Gross per 24 hour  Intake   1084 ml  Output   3975 ml  Net  -2891 ml     Physical Exam: General:  Well appearing. No resp difficulty HEENT: normal Neck: supple. JVP difficult to assess due to body habitus,Carotids 2+ bilat; no bruits. No lymphadenopathy or thryomegaly appreciated. Cor: PMI nondisplaced. Regular rate & rhythm. No rubs, gallops or murmurs. Lungs: Decreased breath sounds. On 3 liters [place]. Oxygen.  Abdomen: Obese, soft, nontender, nondistended. No hepatosplenomegaly. No bruits or masses. Good bowel sounds. Extremities:  no cyanosis, clubbing, rash.  No edema. Neuro: alert & orientedx3, cranial nerves grossly intact. moves all 4 extremities w/o difficulty. Affect pleasant  Telemetry: A-sensed v-paced 70s  Labs: Basic Metabolic Panel:  Recent Labs Lab 01/04/15 1945  01/05/15 0557 01/06/15 0835 01/07/15 0350 01/08/15 0336 01/09/15 0438  NA 138  < > 138 136 137 136 136  K 4.1  < > 4.2 4.4 5.1 4.6 4.5  CL 92*  < > 92* 89* 89* 88* 88*  CO2 32  --  31 33* 40* 39* 37*  GLUCOSE 256*  < > 111* 123* 151* 130* 124*  BUN 23  < > 25* 33* 28* 30* 32*  CREATININE 1.53*  < > 1.28* 1.28* 1.31* 1.47* 1.37*  CALCIUM 9.3  --  8.9 9.2 8.7 9.0 8.8  MG 2.1  --   --   --   --   --   --   < > = values in this interval not displayed.  Liver Function Tests: No results for input(s): AST, ALT, ALKPHOS, BILITOT, PROT, ALBUMIN in the last 168 hours. No results for input(s): LIPASE, AMYLASE in the last 168 hours. No results for input(s): AMMONIA in the last 168 hours.  CBC:  Recent Labs Lab 01/04/15 1945 01/04/15 1951 01/06/15 0835 01/07/15 0350  WBC 11.4*  --  5.9 5.4  HGB 16.0* 18.7* 14.4 13.3  HCT 49.7* 55.0* 44.8 42.8  MCV 92.7  --  92.8 93.4  PLT 322  --  267 272    Cardiac Enzymes: No results for input(s): CKTOTAL, CKMB, CKMBINDEX, TROPONINI in the last 168 hours.  BNP: BNP (last 3 results)  Recent Labs  12/20/14 2212 01/04/15 1945  BNP 235.5* 214.7*    ProBNP (last 3 results)  Recent Labs  08/04/14 2339 08/21/14 1117  PROBNP 1727.0* 171.9*      Other results:  Imaging: No results found.   Medications:     Scheduled Medications: . allopurinol  300 mg Oral Daily  . amiodarone  200 mg Oral Daily  . aspirin EC  81 mg Oral Daily  . carvedilol  6.25 mg Oral BID WC  . digoxin  0.0625 mg Oral Daily  . ferrous sulfate  325 mg Oral TID  . furosemide  120 mg Intravenous BID  . gabapentin  600 mg Oral QID  . heparin  5,000 Units Subcutaneous 3 times per day  . insulin aspart  0-15  Units Subcutaneous TID WC  . insulin aspart  0-5 Units Subcutaneous QHS  . insulin detemir  100 Units Subcutaneous QHS  . isosorbide-hydrALAZINE  0.5 tablet Oral TID  . ivabradine  2.5 mg Oral BID WC  . losartan  25 mg Oral BID  . magnesium oxide  400 mg Oral Daily  . pantoprazole  40 mg Oral Daily  . potassium chloride SA  60 mEq Oral BID  . sodium chloride  10-40 mL Intracatheter Q12H  . sodium chloride  3 mL Intravenous Q12H  . sodium chloride  3 mL Intravenous Q12H  . spironolactone  25 mg Oral BID  . temazepam  30 mg Oral QHS    Infusions:    PRN Medications: sodium chloride, sodium chloride, acetaminophen, albuterol, cyclobenzaprine, HYDROcodone-acetaminophen, ondansetron (ZOFRAN) IV, sodium chloride, sodium chloride, sodium chloride   Assessment:   1. A/C Systolic HF: Nonischemic cardiomyopathy, s/p [employer] CRT-D. EF 30-35% (11/2014). CPX in 6/15 showed mild cardiac limitation but main issue seemed to be severe ventilatory limitation due to obesity and restrictive physiology. Admitted with volume overload. CVP now 6. Stop IV lasix and metolazone. Switch back to home torsemide - Continue current dose of carvedilol, losartan, spiro, and bidil.   - Continue digoxin, level ok.  - Increase ivabradine to 5 bid - Continue to follow co-ox and CVP.  - remove foley  - home 1-2 days 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 K and Mag stable so far this admission. Continue current doses of amiodarone, potassium, magnesium, and spironolactone.  3. OSA: Uses CPAP, need to order for use here.  4. Morbid Obesity 5. CKD: stable 6. Needs to get out of bed/ambulate, will need home PT. Continue PT and CR here.  7. Jerking/tremors - suspect due to amio. Decrease to 100 daily. Check ammonia level.   [name] MD 01/09/2015 1:17 PM

## 2015-01-10 LAB — GLUCOSE, CAPILLARY
GLUCOSE-CAPILLARY: 230 mg/dL — AB (ref 70–99)
GLUCOSE-CAPILLARY: 144 mg/dL — AB (ref 70–99)
GLUCOSE-CAPILLARY: 162 mg/dL — AB (ref 70–99)
Glucose-Capillary: 124 mg/dL — ABNORMAL HIGH (ref 70–99)

## 2015-01-10 LAB — BASIC METABOLIC PANEL
ANION GAP: 8 (ref 5–15)
BUN: 32 mg/dL — ABNORMAL HIGH (ref 6–20)
CALCIUM: 8.9 mg/dL (ref 8.9–10.3)
CHLORIDE: 89 mmol/L — AB (ref 101–111)
CO2: 37 mmol/L — AB (ref 22–32)
Creatinine, Ser: 1.26 mg/dL — ABNORMAL HIGH (ref 0.44–1.00)
GFR calc Af Amer: 57 mL/min — ABNORMAL LOW (ref >60)
GFR calc non Af Amer: 50 mL/min — ABNORMAL LOW (ref >60)
GLUCOSE: 127 mg/dL — AB (ref 70–99)
Potassium: 4.8 mmol/L (ref 3.5–5.1)
Sodium: 134 mmol/L — ABNORMAL LOW (ref 135–145)

## 2015-01-10 NOTE — Progress Notes (Signed)
Patient ID: [name], female   DOB: June 10, 1966, 49 y.o.   MRN: [id number] Advanced Heart Failure Rounding Note   Subjective:    Admitted with increased dyspnea from volume overload. Hypertensive in the ED and received IV NTG. Diuresing with 120 mg IV lasix.   Lasix switched back to po torsemide yesterday. Continues to feel ok. CVP 4. BP ok. Renal function improved. Ammonia level 44. Amio decreased to 100 daily due to myoclonic jerking. She says she thinks they are improved.    Objective:   Weight Range:  Vital Signs:   Temp:  [97.6 F (36.4 C)-98.6 F (37 C)] 97.9 F (36.6 C) (05/01 1109) Pulse Rate:  [70] 70 (05/01 1038) Resp:  [14-25] 21 (05/01 1109) BP: (90-112)/(49-68) 102/60 mmHg (05/01 1109) SpO2:  [91 %-95 %] 95 % (05/01 1109) Weight:  [122.154 kg (269 lb 4.8 oz)] 122.154 kg (269 lb 4.8 oz) (05/01 0457) Last BM Date: 12/09/14  Weight change: Filed Weights   01/08/15 0300 01/09/15 0400 01/10/15 0457  Weight: 121.564 kg (268 lb) 120.929 kg (266 lb 9.6 oz) 122.154 kg (269 lb 4.8 oz)    Intake/Output:   Intake/Output Summary (Last 24 hours) at 01/10/15 1200 Last data filed at 01/10/15 1044  Gross per 24 hour  Intake    464 ml  Output   2300 ml  Net  -1836 ml     Physical Exam: General:  Sitting in chair. Well appearing. No resp difficulty HEENT: normal Neck: supple. JVP difficult to assess due to body habitus,Carotids 2+ bilat; no bruits. No lymphadenopathy or thryomegaly appreciated. Cor: PMI nondisplaced. Regular rate & rhythm. No rubs, gallops or murmurs. Lungs: Decreased breath sounds. On 3 liters [place]. Oxygen.  Abdomen: Obese, soft, nontender, nondistended. No hepatosplenomegaly. No bruits or masses. Good bowel sounds. Extremities: no cyanosis, clubbing, rash.  No edema. Neuro: alert & orientedx3, cranial nerves grossly intact. moves all 4 extremities w/o difficulty. Affect pleasant  Telemetry: A-sensed v-paced 70s  Labs: Basic Metabolic  Panel:  Recent Labs Lab 01/04/15 1945  01/06/15 0835 01/07/15 0350 01/08/15 0336 01/09/15 0438 01/10/15 0517  NA 138  < > 136 137 136 136 134*  K 4.1  < > 4.4 5.1 4.6 4.5 4.8  CL 92*  < > 89* 89* 88* 88* 89*  CO2 32  < > 33* 40* 39* 37* 37*  GLUCOSE 256*  < > 123* 151* 130* 124* 127*  BUN 23  < > 33* 28* 30* 32* 32*  CREATININE 1.53*  < > 1.28* 1.31* 1.47* 1.37* 1.26*  CALCIUM 9.3  < > 9.2 8.7 9.0 8.8 8.9  MG 2.1  --   --   --   --   --   --   < > = values in this interval not displayed.  Liver Function Tests: No results for input(s): AST, ALT, ALKPHOS, BILITOT, PROT, ALBUMIN in the last 168 hours. No results for input(s): LIPASE, AMYLASE in the last 168 hours.  Recent Labs Lab 01/09/15 1355  AMMONIA 44*    CBC:  Recent Labs Lab 01/04/15 1945 01/04/15 1951 01/06/15 0835 01/07/15 0350  WBC 11.4*  --  5.9 5.4  HGB 16.0* 18.7* 14.4 13.3  HCT 49.7* 55.0* 44.8 42.8  MCV 92.7  --  92.8 93.4  PLT 322  --  267 272    Cardiac Enzymes: No results for input(s): CKTOTAL, CKMB, CKMBINDEX, TROPONINI in the last 168 hours.  BNP: BNP (last 3 results)  Recent Labs  12/20/14 2212 01/04/15 1945  BNP 235.5* 214.7*    ProBNP (last 3 results)  Recent Labs  08/04/14 2339 08/21/14 1117  PROBNP 1727.0* 171.9*      Other results:  Imaging: No results found.   Medications:     Scheduled Medications: . allopurinol  300 mg Oral Daily  . amiodarone  100 mg Oral Daily  . aspirin EC  81 mg Oral Daily  . carvedilol  6.25 mg Oral BID WC  . digoxin  0.0625 mg Oral Daily  . ferrous sulfate  325 mg Oral TID  . gabapentin  600 mg Oral QID  . heparin  5,000 Units Subcutaneous 3 times per day  . insulin aspart  0-15 Units Subcutaneous TID WC  . insulin aspart  0-5 Units Subcutaneous QHS  . insulin detemir  100 Units Subcutaneous QHS  . isosorbide-hydrALAZINE  0.5 tablet Oral TID  . ivabradine  5 mg Oral BID WC  . losartan  25 mg Oral BID  . magnesium oxide  400  mg Oral Daily  . pantoprazole  40 mg Oral Daily  . potassium chloride SA  60 mEq Oral BID  . sodium chloride  10-40 mL Intracatheter Q12H  . sodium chloride  3 mL Intravenous Q12H  . sodium chloride  3 mL Intravenous Q12H  . spironolactone  25 mg Oral BID  . temazepam  30 mg Oral QHS  . torsemide  80 mg Oral Daily    Infusions:    PRN Medications: sodium chloride, sodium chloride, acetaminophen, albuterol, cyclobenzaprine, HYDROcodone-acetaminophen, ondansetron (ZOFRAN) IV, sodium chloride, sodium chloride, sodium chloride   Assessment:   1. A/C Systolic HF: Nonischemic cardiomyopathy, s/p [employer] CRT-D. EF 30-35% (11/2014). CPX in 6/15 showed mild cardiac limitation but main issue seemed to be severe ventilatory limitation due to obesity and restrictive physiology. Admitted with volume overload. CVP now 4 back on po diuretics. - Continue current dose of carvedilol, losartan, spiro, and bidil.   - Continue digoxin, level ok.  - Increased ivabradine to 5 bid yesterday - Can go to tele. Home in am  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 K and Mag stable so far this admission. Continue amiodarone at 100 daily, potassium, magnesium, and spironolactone.  3. OSA: Uses CPAP. High hgb on admit suggest that she may not be compliant.  4. Morbid Obesity 5. CKD: stable 6. Needs to get out of bed/ambulate, will need home PT. Continue PT and CR here.  7. Jerking/tremors - suspect due to amio. Decreased to 100 daily. Ammonia level ok  Transfer to tele. Home in am.   [name] MD 01/10/2015 12:00 PM

## 2015-01-11 LAB — BASIC METABOLIC PANEL
Anion gap: 8 (ref 5–15)
Anion gap: 9 (ref 5–15)
BUN: 34 mg/dL — ABNORMAL HIGH (ref 6–20)
BUN: 33 mg/dL — ABNORMAL HIGH (ref 6–20)
CHLORIDE: 91 mmol/L — AB (ref 101–111)
CO2: 36 mmol/L — ABNORMAL HIGH (ref 22–32)
CO2: 35 mmol/L — ABNORMAL HIGH (ref 22–32)
Calcium: 9 mg/dL (ref 8.9–10.3)
Calcium: 8.9 mg/dL (ref 8.9–10.3)
Chloride: 91 mmol/L — ABNORMAL LOW (ref 101–111)
Creatinine, Ser: 1.39 mg/dL — ABNORMAL HIGH (ref 0.44–1.00)
Creatinine, Ser: 1.37 mg/dL — ABNORMAL HIGH (ref 0.44–1.00)
GFR calc Af Amer: 51 mL/min — ABNORMAL LOW (ref >60)
GFR calc non Af Amer: 44 mL/min — ABNORMAL LOW (ref >60)
GFR, EST AFRICAN AMERICAN: 52 mL/min — AB (ref >60)
GFR, EST NON AFRICAN AMERICAN: 45 mL/min — AB (ref >60)
GLUCOSE: 136 mg/dL — AB (ref 70–99)
GLUCOSE: 123 mg/dL — AB (ref 70–99)
Potassium: 5.7 mmol/L — ABNORMAL HIGH (ref 3.5–5.1)
Potassium: 4.7 mmol/L (ref 3.5–5.1)
Sodium: 135 mmol/L (ref 135–145)
Sodium: 135 mmol/L (ref 135–145)

## 2015-01-11 LAB — GLUCOSE, CAPILLARY: GLUCOSE-CAPILLARY: 135 mg/dL — AB (ref 70–99)

## 2015-01-11 MED ORDER — AMIODARONE HCL 200 MG PO TABS: 100.0000 mg | ORAL_TABLET | Freq: Every day | ORAL | Status: DC

## 2015-01-11 MED ORDER — SPIRONOLACTONE 25 MG PO TABS
25.0000 mg | ORAL_TABLET | Freq: Two times a day (BID) | ORAL | Status: DC
  Filled 2015-01-11 (×2): qty 1

## 2015-01-11 MED ORDER — LOSARTAN POTASSIUM 25 MG PO TABS: 25.0000 mg | ORAL_TABLET | Freq: Two times a day (BID) | ORAL | Status: DC

## 2015-01-11 MED ORDER — TORSEMIDE 20 MG PO TABS
80.0000 mg | ORAL_TABLET | Freq: Two times a day (BID) | ORAL | Status: DC
  Administered 2015-01-11: 80 mg via ORAL
  Filled 2015-01-11 (×3): qty 4

## 2015-01-11 MED ORDER — ACETAMINOPHEN 325 MG PO TABS: 650.0000 mg | ORAL_TABLET | ORAL | Status: DC | PRN

## 2015-01-11 MED ORDER — METOLAZONE 2.5 MG PO TABS: 2.5000 mg | ORAL_TABLET | ORAL | Status: DC

## 2015-01-11 MED ORDER — INSULIN ASPART 100 UNIT/ML ~~LOC~~ SOLN
0.0000 [IU] | Freq: Three times a day (TID) | SUBCUTANEOUS | Status: DC
  Administered 2015-01-11: 2 [IU] via SUBCUTANEOUS

## 2015-01-11 NOTE — Progress Notes (Signed)
Physical Therapy Treatment Patient Details Name: [name] MRN: [id number] DOB: 16-Jun-1966 Today's Date: 01/11/2015    History of Present Illness 49 yo admitted 4/25 with increased dyspnea from volume overload. PMH: NICM, ICD with discharges, HTN, DM, OSA    PT Comments    Pt ambulated 280' with intermittent hand held assist, no LOB, HR 74. Overall increased gait distance. Encouraged pt to use cane or RW at home. From PT standpoint she is ready to DC home.   Follow Up Recommendations  Home health PT     Equipment Recommendations  Rolling walker with 5" wheels (pt recently received RW but would benefit from rollator instead)    Recommendations for Other Services       Precautions / Restrictions Precautions Precautions: Fall Precaution Comments: pt stated the only time she falls is when ICD discharges Restrictions Weight Bearing Restrictions: No    Mobility  Bed Mobility               General bed mobility comments: up on EOB  Transfers Overall transfer level: Needs assistance Equipment used: None   Sit to Stand: Independent            Ambulation/Gait Ambulation/Gait assistance: Modified independent (Device/Increase time) Ambulation Distance (Feet): 280 Feet Assistive device: 1 person hand held assist Gait Pattern/deviations: Step-through pattern;Decreased stride length   Gait velocity interpretation: Below normal speed for age/gender General Gait Details: slow gait with standing rest x 1 due to pain in R hip, pt stated this is chronic pain due to arthritis, pt tends to reach for hand rail in hall or for HHA, but there were also times she walked without UE support   Stairs            Wheelchair Mobility    Modified Rankin (Stroke Patients Only)       Balance     Sitting balance-[name]: Good       Standing balance-[name]: Fair                      Cognition Arousal/Alertness: Awake/alert Behavior During Therapy:  WFL for tasks assessed/performed Overall Cognitive Status: Within Functional Limits for tasks assessed                      Exercises      General Comments        Pertinent Vitals/Pain Pain Assessment: No/denies pain    Home Living                      Prior Function            PT Goals (current goals can now be found in the care plan section) Acute Rehab PT Goals Patient Stated Goal: be able to walk in the grocery store PT Goal Formulation: With patient Time For Goal Achievement: 01/21/15 Potential to Achieve Goals: Good Progress towards PT goals: Progressing toward goals    Frequency  Min 3X/week    PT Plan Current plan remains appropriate    Co-evaluation             End of Session Equipment Utilized During Treatment: Gait belt Activity Tolerance: Patient tolerated treatment well Patient left: in chair;with call bell/phone within reach;with family/visitor present     Time: 4098-1191 PT Time Calculation (min) (ACUTE ONLY): 16 min  Charges:  $Gait Training: 8-22 mins  G Codes:      Tamala Ser 01/11/2015, 10:27 AM [phone]

## 2015-01-11 NOTE — Discharge Instructions (Signed)

## 2015-01-11 NOTE — Progress Notes (Signed)
Pt is being discharged home. Pt has been provided with discharge instructions. RN went over instructions with the patient and answered all questions she had. Pt is being transported home by her husband who is at the bedside

## 2015-01-11 NOTE — Progress Notes (Signed)
Patient ID: [name], female   DOB: 1965-10-29, 49 y.o.   MRN: [id number] Advanced Heart Failure Rounding Note   Subjective:    Admitted with increased dyspnea from volume overload. Hypertensive in the ED and received IV NTG. Diuresing with 120 mg IV lasix.   Breathing better, good overall diuresis.  K high this morning.   RHC (4/27):  RA mean 18 RV 66/22 PA 66/34, mean 48 PCWP mean 28 Oxygen saturations: PA 55% AO 93% Cardiac Output (Fick) 3.77  Cardiac Index (Fick) 1.8 PVR 5.3 WU Cardiac Output (Thermo) 4.77 Cardiac Index (Thermo) 2.26 PVR 4.2 WU  Objective:   Weight Range:  Vital Signs:   Temp:  [97.3 F (36.3 C)-98.8 F (37.1 C)] 97.3 F (36.3 C) (05/02 0700) Pulse Rate:  [70-72] 70 (05/02 0700) Resp:  [18-21] 20 (05/02 0700) BP: (100-113)/(52-70) 108/64 mmHg (05/02 0700) SpO2:  [93 %-100 %] 100 % (05/02 0700) Weight:  [268 lb 9.7 oz (121.84 kg)-269 lb 13.5 oz (122.4 kg)] 268 lb 9.7 oz (121.84 kg) (05/02 0700) Last BM Date: 12/09/14  Weight change: Filed Weights   01/10/15 0457 01/10/15 1901 01/11/15 0700  Weight: 269 lb 4.8 oz (122.154 kg) 269 lb 13.5 oz (122.4 kg) 268 lb 9.7 oz (121.84 kg)    Intake/Output:   Intake/Output Summary (Last 24 hours) at 01/11/15 0740 Last data filed at 01/11/15 0600  Gross per 24 hour  Intake   1010 ml  Output    500 ml  Net    510 ml     Physical Exam: General:  Well appearing. No resp difficulty HEENT: normal Neck: supple. JVP difficult to assess due to body habitus, suspect ~8. Carotids 2+ bilat; no bruits. No lymphadenopathy or thryomegaly appreciated. Cor: PMI nondisplaced. Regular rate & rhythm. No rubs, gallops or murmurs. Lungs: Decreased breath sounds. On 3 liters [place]. Oxygen.  Abdomen: Obese, soft, nontender, nondistended. No hepatosplenomegaly. No bruits or masses. Good bowel sounds. Extremities: no cyanosis, clubbing, rash.  No edema. Neuro: alert & orientedx3, cranial nerves grossly intact. moves all  4 extremities w/o difficulty. Affect pleasant  Telemetry: A-sensed v-paced 70s  Labs: Basic Metabolic Panel:  Recent Labs Lab 01/04/15 1945  01/07/15 0350 01/08/15 0336 01/09/15 0438 01/10/15 0517 01/11/15 0513  NA 138  < > 137 136 136 134* 135  K 4.1  < > 5.1 4.6 4.5 4.8 5.7*  CL 92*  < > 89* 88* 88* 89* 91*  CO2 32  < > 40* 39* 37* 37* 36*  GLUCOSE 256*  < > 151* 130* 124* 127* 136*  BUN 23  < > 28* 30* 32* 32* 34*  CREATININE 1.53*  < > 1.31* 1.47* 1.37* 1.26* 1.39*  CALCIUM 9.3  < > 8.7 9.0 8.8 8.9 9.0  MG 2.1  --   --   --   --   --   --   < > = values in this interval not displayed.  Liver Function Tests: No results for input(s): AST, ALT, ALKPHOS, BILITOT, PROT, ALBUMIN in the last 168 hours. No results for input(s): LIPASE, AMYLASE in the last 168 hours.  Recent Labs Lab 01/09/15 1355  AMMONIA 44*    CBC:  Recent Labs Lab 01/04/15 1945 01/04/15 1951 01/06/15 0835 01/07/15 0350  WBC 11.4*  --  5.9 5.4  HGB 16.0* 18.7* 14.4 13.3  HCT 49.7* 55.0* 44.8 42.8  MCV 92.7  --  92.8 93.4  PLT 322  --  267 272  Cardiac Enzymes: No results for input(s): CKTOTAL, CKMB, CKMBINDEX, TROPONINI in the last 168 hours.  BNP: BNP (last 3 results)  Recent Labs  12/20/14 2212 01/04/15 1945  BNP 235.5* 214.7*    ProBNP (last 3 results)  Recent Labs  08/04/14 2339 08/21/14 1117  PROBNP 1727.0* 171.9*      Other results:  Imaging: No results found.   Medications:     Scheduled Medications: . allopurinol  300 mg Oral Daily  . amiodarone  100 mg Oral Daily  . aspirin EC  81 mg Oral Daily  . carvedilol  6.25 mg Oral BID WC  . digoxin  0.0625 mg Oral Daily  . ferrous sulfate  325 mg Oral TID  . gabapentin  600 mg Oral QID  . heparin  5,000 Units Subcutaneous 3 times per day  . insulin aspart  0-15 Units Subcutaneous TID WC  . insulin aspart  0-5 Units Subcutaneous QHS  . insulin detemir  100 Units Subcutaneous QHS  . isosorbide-hydrALAZINE   0.5 tablet Oral TID  . ivabradine  5 mg Oral BID WC  . losartan  25 mg Oral BID  . magnesium oxide  400 mg Oral Daily  . pantoprazole  40 mg Oral Daily  . sodium chloride  10-40 mL Intracatheter Q12H  . sodium chloride  3 mL Intravenous Q12H  . sodium chloride  3 mL Intravenous Q12H  . spironolactone  25 mg Oral BID  . temazepam  30 mg Oral QHS  . torsemide  80 mg Oral BID    Infusions:    PRN Medications: sodium chloride, sodium chloride, acetaminophen, albuterol, cyclobenzaprine, HYDROcodone-acetaminophen, ondansetron (ZOFRAN) IV, sodium chloride, sodium chloride, sodium chloride   Assessment:   1. A/C Systolic HF: Nonischemic cardiomyopathy, s/p [employer] CRT-D. EF 30-35% (11/2014). CPX in 6/15 showed mild cardiac limitation but main issue seemed to be severe ventilatory limitation due to obesity and restrictive physiology. Admitted with volume overload. Had gained 8 pounds since prior discharge likely due increase fluid  Intake. Diuresed with IV lasix 120 mg twice a day and metolazone, now back on po torsemide. RHC 4/27 showed ongoing volume overload and marginal cardiac output.  Feels better overall but K is high today.  - Repeat K now.  - Continue current dose of carvedilol, losartan, spironolactone, and bidil.   - Continue digoxin, level ok.  - She will continue her home dose torsemide 80 mg bid and will take metolazone once weekly.    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 K and Mag stable so far this admission, K actually high today. Continue current doses of amiodarone, potassium, magnesium, and spironolactone. Amiodarone was lowered this admission to 100 mg daily due to tremors.  3. OSA: Continue CPAP.   4. Morbid Obesity 5. CKD: Follow creatinine closely, stable so far.   6. Disposition: I think that she can go home today.  Will repeat K stat.  She will need home health/PT.  She will need followup in CHF clinic in 1 week.  Home  regimen: torsemide 80 mg bid, metolazone 2.5 mg qwk on Tuesdays, KCl 40 bid (except on metolazone days take 40 tid), spironolactone 25 bid, ASA 81, amiodarone 100 daily, digoxin 0.0625 daily, Coreg 6.25 bid, losartan 25 bid, Bidil 1/2 tab tid, ivabradine 5 bid, magnesium oxide 400 daily, noncardiac meds as prior to admission.   [name] 01/11/2015 7:40 AM

## 2015-01-11 NOTE — Discharge Summary (Signed)
Patient ID: [name],  MRN: [id number], DOB/AGE: [id number] 49 y.o.  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   Essential hypertension   VT - hospitalized 4/19-4/22/16 with multiple IOCD discharges (low K+ and Mg++)   Non-ischemic cardiomyopathy   Acute on chronic renal insufficiency   Iron deficiency anemia   GERD (gastroesophageal reflux disease)   ICD- BS 2007   Peripheral neuropathy   Dyspnea   Morbid obesity   OSA on CPAP    Procedures:  Rt  heart cath 01/06/15   Hospital Course:  49 yo woman with known PMH of NICM (cath April 2015) s/p BS CRT-D 2007, morbid obesity, OSA on CPAP, IDDM with neuropathy and nephropathy, and chronic anemia, recently admitted 4/18-4/22/16 with recurrent ICD shocks for polymorphic VT in the setting of hypokalemia and hypomagnesemia. She came back to the ED 01/04/15 with a history of 2 days of progressive dyspnea. She reported her weight was was 274 lbs. She previously has weighed as low as 250s and recently as low as 265 but then had polymorphic VT. Her DC wgt 4/22/ was 272. The pt was admitted and started on Lasix 120 mg IV BID. Her rhythm and electrolytes were stable. She had a Rt heart cath on 01/06/15 which revealed elevated Lt and Rt sided pressures. PICC line was placed to follow CVP and co-ox. On 4/28 her CVP was 17- co-ox improving -75%. She continued to diurese and by 4/30 her CVP was down to 6 and co-ox 57%. She was seen by Dr [name] the morning of the 2nd and felt to be stable for discharge. Repeat K+ before discharge was 4.7. She'll f/u in the CHF clinic as a TOC pt on 5/9. She also has an appointment in the EP clinic 5/9 for device check. During this admission she had  complained of some tremor and her Amiodarone was decreased to 100 mg daily.   Discharge Vitals:  Blood pressure 108/64, pulse 70, temperature 97.3 F (36.3 C), temperature source Oral, resp. rate 20, height  (1.651 m), weight 268 lb 9.7 oz (121.84 kg), last menstrual period 06/21/2012, SpO2 100 %.    Labs: Results for orders placed or performed during the hospital encounter of 01/04/15 (from the past 24 hour(s))  Glucose, capillary     Status: Abnormal   Collection Time: 01/10/15 11:08 AM  Result Value Ref Range   Glucose-Capillary 230 (H) 70 - 99 mg/dL   Comment 1 Capillary Specimen   Glucose, capillary     Status: Abnormal   Collection Time: 01/10/15  4:29 PM  Result Value Ref Range   Glucose-Capillary 144 (H) 70 - 99 mg/dL   Comment 1 Capillary Specimen   Glucose, capillary     Status: Abnormal   Collection Time: 01/10/15  9:10 PM  Result Value Ref Range   Glucose-Capillary 162 (H) 70 - 99 mg/dL  Basic metabolic panel     Status: Abnormal   Collection Time: 01/11/15  5:13 AM  Result Value Ref Range   Sodium 135 135 - 145 mmol/L   Potassium 5.7 (H) 3.5 - 5.1 mmol/L   Chloride 91 (L) 101 - 111 mmol/L   CO2 36 (H) 22 - 32 mmol/L   Glucose, Bld 136 (H) 70 -  99 mg/dL   BUN 34 (H) 6 - 20 mg/dL   Creatinine, Ser 3.66 (H) 0.44 - 1.00 mg/dL   Calcium 9.0 8.9 - 44.0 mg/dL   GFR calc non Af Amer 44 (L) >60 mL/min   GFR calc Af Amer 51 (L) >60 mL/min   Anion gap 8 5 - 15  Glucose, capillary     Status: Abnormal   Collection Time: 01/11/15  6:16 AM  Result Value Ref Range   Glucose-Capillary 135 (H) 70 - 99 mg/dL  Basic metabolic panel     Status: Abnormal   Collection Time: 01/11/15  8:15 AM  Result Value Ref Range   Sodium 135 135 - 145 mmol/L   Potassium 4.7 3.5 - 5.1 mmol/L   Chloride 91 (L) 101 - 111 mmol/L   CO2 35 (H) 22 - 32 mmol/L   Glucose, Bld 123 (H) 70 - 99 mg/dL   BUN 33 (H) 6 - 20 mg/dL   Creatinine, Ser 3.47 (H) 0.44 - 1.00 mg/dL   Calcium 8.9 8.9 - 42.5  mg/dL   GFR calc non Af Amer 45 (L) >60 mL/min   GFR calc Af Amer 52 (L) >60 mL/min   Anion gap 9 5 - 15    Disposition:  Follow-up Information    Follow up with [facility].   Why:  hhrn and hhpt, they will call 24-48hrs after disch to set up appt   Contact information:   [address] [place] [address] [phone]       Follow up with [name], MD On 01/18/2015.   Specialty:  Cardiology   Why:  at 2:20 pm in the [facility]--gate code [id number]--Please bring all medications to appt   Contact information:   [address] [place] [address] [phone]       Follow up with Hillis Range, MD On 01/18/2015.   Specialty:  Cardiology   Why:  8:00 am for ICD check   Contact information:   [address] [place] [address] [phone]       Discharge Medications:    Medication List    TAKE these medications        acetaminophen 325 MG tablet  Commonly known as:  TYLENOL  Take 2 tablets (650 mg total) by mouth every 4 (four) hours as needed for headache or mild pain.     albuterol 108 (90 BASE) MCG/ACT inhaler  Commonly known as:  PROVENTIL HFA;VENTOLIN HFA  Inhale 2 puffs into the lungs every 6 (six) hours as needed for wheezing or shortness of breath.     allopurinol 300 MG tablet  Commonly known as:  ZYLOPRIM  Take 1 tablet (300 mg total) by mouth daily.     amiodarone 200 MG tablet  Commonly known as:  PACERONE  Take 0.5 tablets (100 mg total) by mouth daily.     aspirin 81 MG EC tablet  Take 1 tablet (81 mg total) by mouth daily.     carvedilol 6.25 MG tablet  Commonly known as:  COREG  Take 1 tablet (6.25 mg total) by mouth 2 (two) times daily with a meal.     cetirizine 10 MG tablet  Commonly known as:  ZYRTEC  Take 10 mg by mouth daily as needed for allergies.     cyclobenzaprine 10 MG tablet  Commonly known as:  FLEXERIL  Take 10 mg by mouth 3 (three) times daily as  needed for muscle spasms.     digoxin 0.125 MG tablet  Commonly known as:  LANOXIN  Take 0.5 tablets (0.0625 mg total) by mouth daily.     ferrous sulfate 325 (65 FE) MG tablet  Take 325 mg by mouth 3 (three) times daily.     gabapentin 300 MG capsule  Commonly known as:  NEURONTIN  Take 600 mg by mouth 4 (four) times daily.     HYDROcodone-acetaminophen 10-325 MG per tablet  Commonly known as:  NORCO  Take 1 tablet by mouth every 4 (four) hours as needed for moderate pain.     insulin aspart 100 UNIT/ML injection  Commonly known as:  novoLOG  Inject 0-20 Units into the skin 3 (three) times daily with meals.     insulin detemir 100 UNIT/ML injection  Commonly known as:  LEVEMIR  Inject 100 Units into the skin at bedtime.     isosorbide-hydrALAZINE 20-37.5 MG per tablet  Commonly known as:  BIDIL  Take 0.5 tablets by mouth 3 (three) times daily.     ivabradine 5 MG Tabs tablet  Commonly known as:  CORLANOR  Take 0.5 tablets (2.5 mg total) by mouth 2 (two) times daily with a meal.     losartan 25 MG tablet  Commonly known as:  COZAAR  Take 1 tablet (25 mg total) by mouth 2 (two) times daily.     magnesium oxide 400 (241.3 MG) MG tablet  Commonly known as:  MAG-OX  Take 1 tablet (400 mg total) by mouth daily.     metolazone 2.5 MG tablet  Commonly known as:  ZAROXOLYN  Take 1 tablet (2.5 mg total) by mouth once a week. Take one tablet by mouth 30 minutes before taking morning dose of Torsemide on Tuesdays     omeprazole 40 MG capsule  Commonly known as:  PRILOSEC  Take 1 capsule (40 mg total) by mouth daily.     potassium chloride SA 20 MEQ tablet  Commonly known as:  K-DUR,KLOR-CON  Take 3 tablets (60 mEq total) by mouth 2 (two) times daily. Take an extra 60 meq of potassium on Metolazone days.     spironolactone 25 MG tablet  Commonly known as:  ALDACTONE  Take 1 tablet (25 mg total) by mouth 2 (two) times daily.     temazepam 30 MG capsule  Commonly known as:   RESTORIL  Take 30 mg by mouth at bedtime.     torsemide 20 MG tablet  Commonly known as:  DEMADEX  Take 4 tablets (80 mg total) by mouth 2 (two) times daily.     vitamin E 400 UNIT capsule  Take 400 Units by mouth daily.         Duration of Discharge Encounter: Greater than 30 minutes including physician time.  Jolene Provost PA-C 01/11/2015 9:29 AM

## 2015-01-12 ENCOUNTER — Other Ambulatory Visit: Payer: Self-pay | Admitting: *Deleted

## 2015-01-15 ENCOUNTER — Emergency Department (HOSPITAL_COMMUNITY)
Admission: EM | Admit: 2015-01-15 | Discharge: 2015-01-16 | Disposition: A | Discharge status: Home / Self Care | Payer: Medicare Other | Attending: Emergency Medicine | Admitting: Emergency Medicine

## 2015-01-15 ENCOUNTER — Telehealth: Payer: Self-pay | Admitting: Physician Assistant

## 2015-01-15 ENCOUNTER — Encounter (HOSPITAL_COMMUNITY): Payer: Self-pay | Admitting: Emergency Medicine

## 2015-01-15 ENCOUNTER — Emergency Department (HOSPITAL_COMMUNITY): Payer: Medicare Other

## 2015-01-15 DIAGNOSIS — I10 Essential (primary) hypertension: Secondary | ICD-10-CM | POA: Insufficient documentation

## 2015-01-15 DIAGNOSIS — E119 Type 2 diabetes mellitus without complications: Secondary | ICD-10-CM | POA: Insufficient documentation

## 2015-01-15 DIAGNOSIS — Z794 Long term (current) use of insulin: Secondary | ICD-10-CM | POA: Diagnosis not present

## 2015-01-15 DIAGNOSIS — R51 Headache: Secondary | ICD-10-CM | POA: Insufficient documentation

## 2015-01-15 DIAGNOSIS — Z8669 Personal history of other diseases of the nervous system and sense organs: Secondary | ICD-10-CM | POA: Insufficient documentation

## 2015-01-15 DIAGNOSIS — I509 Heart failure, unspecified: Secondary | ICD-10-CM | POA: Diagnosis not present

## 2015-01-15 DIAGNOSIS — J45901 Unspecified asthma with (acute) exacerbation: Secondary | ICD-10-CM | POA: Diagnosis not present

## 2015-01-15 DIAGNOSIS — Z95810 Presence of automatic (implantable) cardiac defibrillator: Secondary | ICD-10-CM | POA: Diagnosis not present

## 2015-01-15 DIAGNOSIS — K429 Umbilical hernia without obstruction or gangrene: Secondary | ICD-10-CM | POA: Diagnosis not present

## 2015-01-15 DIAGNOSIS — Z87891 Personal history of nicotine dependence: Secondary | ICD-10-CM | POA: Insufficient documentation

## 2015-01-15 DIAGNOSIS — Z9049 Acquired absence of other specified parts of digestive tract: Secondary | ICD-10-CM | POA: Insufficient documentation

## 2015-01-15 DIAGNOSIS — K219 Gastro-esophageal reflux disease without esophagitis: Secondary | ICD-10-CM | POA: Diagnosis not present

## 2015-01-15 DIAGNOSIS — Z3202 Encounter for pregnancy test, result negative: Secondary | ICD-10-CM | POA: Diagnosis not present

## 2015-01-15 DIAGNOSIS — Z7982 Long term (current) use of aspirin: Secondary | ICD-10-CM | POA: Insufficient documentation

## 2015-01-15 DIAGNOSIS — R52 Pain, unspecified: Secondary | ICD-10-CM

## 2015-01-15 DIAGNOSIS — Z79899 Other long term (current) drug therapy: Secondary | ICD-10-CM | POA: Insufficient documentation

## 2015-01-15 DIAGNOSIS — R079 Chest pain, unspecified: Secondary | ICD-10-CM | POA: Diagnosis present

## 2015-01-15 DIAGNOSIS — Z862 Personal history of diseases of the blood and blood-forming organs and certain disorders involving the immune mechanism: Secondary | ICD-10-CM | POA: Diagnosis not present

## 2015-01-15 DIAGNOSIS — R1013 Epigastric pain: Secondary | ICD-10-CM | POA: Insufficient documentation

## 2015-01-15 LAB — CBC WITH DIFFERENTIAL/PLATELET
BASOS PCT: 0 % (ref 0–1)
Basophils Absolute: 0 10*3/uL (ref 0.0–0.1)
EOS PCT: 4 % (ref 0–5)
Eosinophils Absolute: 0.3 10*3/uL (ref 0.0–0.7)
HEMATOCRIT: 42.3 % (ref 36.0–46.0)
HEMOGLOBIN: 14.1 g/dL (ref 12.0–15.0)
Lymphocytes Relative: 40 % (ref 12–46)
Lymphs Abs: 3.2 10*3/uL (ref 0.7–4.0)
MCH: 30 pg (ref 26.0–34.0)
MCHC: 33.3 g/dL (ref 30.0–36.0)
MCV: 90 fL (ref 78.0–100.0)
MONO ABS: 0.5 10*3/uL (ref 0.1–1.0)
Monocytes Relative: 6 % (ref 3–12)
Neutro Abs: 4 10*3/uL (ref 1.7–7.7)
Neutrophils Relative %: 50 % (ref 43–77)
Platelets: 289 10*3/uL (ref 150–400)
RBC: 4.7 MIL/uL (ref 3.87–5.11)
RDW: 14 % (ref 11.5–15.5)
WBC: 8.1 10*3/uL (ref 4.0–10.5)

## 2015-01-15 LAB — BASIC METABOLIC PANEL
ANION GAP: 12 (ref 5–15)
BUN: 32 mg/dL — ABNORMAL HIGH (ref 6–20)
CALCIUM: 8.9 mg/dL (ref 8.9–10.3)
CO2: 28 mmol/L (ref 22–32)
Chloride: 95 mmol/L — ABNORMAL LOW (ref 101–111)
Creatinine, Ser: 1.45 mg/dL — ABNORMAL HIGH (ref 0.44–1.00)
GFR, EST AFRICAN AMERICAN: 48 mL/min — AB (ref >60)
GFR, EST NON AFRICAN AMERICAN: 42 mL/min — AB (ref >60)
Glucose, Bld: 196 mg/dL — ABNORMAL HIGH (ref 70–99)
Potassium: 4.7 mmol/L (ref 3.5–5.1)
SODIUM: 135 mmol/L (ref 135–145)

## 2015-01-15 LAB — POC OCCULT BLOOD, ED: FECAL OCCULT BLD: NEGATIVE

## 2015-01-15 LAB — I-STAT TROPONIN, ED
TROPONIN I, POC: 0 ng/mL (ref 0.00–0.08)
Troponin i, poc: 0 ng/mL (ref 0.00–0.08)

## 2015-01-15 LAB — BRAIN NATRIURETIC PEPTIDE: B Natriuretic Peptide: 30.8 pg/mL (ref 0.0–100.0)

## 2015-01-15 LAB — LIPASE, BLOOD: Lipase: 35 U/L (ref 22–51)

## 2015-01-15 LAB — POC URINE PREG, ED: Preg Test, Ur: NEGATIVE

## 2015-01-15 MED ORDER — ACETAMINOPHEN 325 MG PO TABS
975.0000 mg | ORAL_TABLET | Freq: Once | ORAL | Status: AC
  Administered 2015-01-15: 975 mg via ORAL
  Filled 2015-01-15: qty 3

## 2015-01-15 NOTE — ED Provider Notes (Signed)
CSN: [id number]     Arrival date & time 01/15/15  2008 History   First MD Initiated Contact with Patient 01/15/15 2044     Chief Complaint  Patient presents with  . Shortness of Breath    The patient said she has been having SOB from earlier today and she also started having chest pain.  . Chest Pain     (Consider location/radiation/quality/duration/timing/severity/associated sxs/prior Treatment) HPI Patient developed shortness of breath, lightheadedness 7:15 PM today and she developed epigastric pain earlier today. She feels constipated. Last bowel movement was yesterday. She also reports that she felt her heart racing at 7:15 which lasted for 5 minutes. No treatment prior to coming here. No other associated symptoms. Nothing makes symptoms better or worse. Past Medical History  Diagnosis Date  . CHF (congestive heart failure)     a. EF 30-35%, RV mildly dilated (difficult study 11/2013) b. RHC (12/2013): RA 38/37 (35), RV 90/28, PA 95/51 (71), PCWP 54, PA 40%, AO 96 %, CO/CI Fick 3.2/1.4, PVR 5.3   . Nonischemic cardiomyopathy 12/2013    a. LHC (12/2013) normal coronary anatomy   . Mitral regurgitation     moderate to severe  . S/P cardiac catheterization   . LBBB (left bundle branch block)     s/p [employer] CRT-D  . HTN (hypertension)   . Asthma   . IBS (irritable bowel syndrome)     with primarily constipation  . Morbid obesity   . GERD (gastroesophageal reflux disease)   . IDA (iron deficiency anemia)     2o TO SB AVMS, Hx parenteral iron Dr Mariel Sleet  . AVM (arteriovenous malformation)     Small bowel, s/p duble balloon enteroscopy/APC jejunum Dr Gwinda Passe [facility]) 01/23/2011  . Anemia, chronic disease   . Peripheral neuropathy   . OSA (obstructive sleep apnea)     on CPAP qhs  . GI bleed 06/2009    Hgb 7.5, ferritin 7, transfusion, iron IV  . Dyspnea on exertion     chronic  . Diabetes mellitus without complication   . 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 Pt unaware of shock on 07/27/14. Thresholds and sensing consistent with previous device measurements. Lead impedance trends stable over time.    Past Surgical History  Procedure Laterality Date  . Crdt-implantation  4/07    [employer]. remote- yes  . Appendectomy    . Cholecystectomy      biliary dyskinesia  . Mastectomy  2003    left, partial  . Knee arthroscopy  2005    left  . Small bowel enteroscopy  MAY 2012 [facility] DR. GILLIAM    SB AVMS s/p APC  . Colonoscopy  OCT 2010/SEP 2011    tortuous colon, 3 polyps-benign(2010),   . Upper gastrointestinal endoscopy  '08, '10, SEP 2011    mild antral gastritis (2010), negative SB bx (2010), incomlpete Schatzki's ring (2011)  . Small bowel enteroscopy  SEP 2011 [facility]    Fields-NO AVMS  . Givens capsule study  NOV 2010     NO AVMS, normal  . Irrigation and debridement abscess  06/25/2012    Procedure: MINOR INCISION AND DRAINAGE OF ABSCESS;  Surgeon: [name], MD;  Location: [facility];  Service: General;  Laterality: [name];  Incision & Drainage of Infected Sebaceous Cyst on Chest  . Breast surgery    .  Cardiac defibrillator placement      [employer]  . Implantable cardioverter defibrillator generator change Left 02/15/2012    Procedure: IMPLANTABLE CARDIOVERTER DEFIBRILLATOR GENERATOR CHANGE;  Surgeon: Marinus Maw, MD;  Location: [facility] CATH LAB;  Service: Cardiovascular;  Laterality: Left;  . Left and right heart catheterization with coronary/graft angiogram  12/12/2013    Procedure: LEFT AND RIGHT HEART CATHETERIZATION WITH Isabel Caprice;  Surgeon: Peter M Swaziland, MD;  Location: [facility] CATH LAB;  Service: Cardiovascular;;  . Right heart catheterization [name] 01/06/2015    Procedure: RIGHT HEART CATH;  Surgeon: Laurey Morale, MD;  Location: [facility] CATH LAB;  Service:  Cardiovascular;  Laterality: [name];   Family History  Problem Relation Age of Onset  . Colon cancer Neg Hx     no family Hx of polyps too, uncle  . Cervical cancer Mother   . Hypertension Mother   . Cancer Mother   . Heart disease Father   . Hypertension Father   . GER disease Father   . Heart attack Father   . Bleeding Disorder Father   . Diabetes Sister   . Heart disease Sister   . Hypertension Sister    History  Substance Use Topics  . Smoking status: Former Smoker -- 0.50 packs/day for 20 years    Types: Cigarettes    Quit date: 11/19/2009  . Smokeless tobacco: Former Neurosurgeon  . Alcohol Use: No   OB History    Gravida Para Term Preterm AB TAB SAB Ectopic Multiple Living   6 1 1  5  3 2        Review of Systems  Respiratory: Positive for shortness of breath.        Dyspnea is chronic.  Cardiovascular: Positive for palpitations.  Gastrointestinal: Positive for abdominal pain and constipation.  Musculoskeletal: Negative.   Skin: Negative.   Neurological: Positive for weakness and headaches.       Generalized weakness  Psychiatric/Behavioral: Negative.   All other systems reviewed and are negative.     Allergies  Cymbalta; Trazodone and nefazodone; and Lyrica  Home Medications   Prior to Admission medications   Medication Sig Start Date End Date Taking? Authorizing Provider  acetaminophen (TYLENOL) 325 MG tablet Take 2 tablets (650 mg total) by mouth every 4 (four) hours as needed for headache or mild pain. 01/11/15   Abelino Derrick, PA-C  albuterol (PROVENTIL HFA;VENTOLIN HFA) 108 (90 BASE) MCG/ACT inhaler Inhale 2 puffs into the lungs every 6 (six) hours as needed for wheezing or shortness of breath.    Historical Provider, MD  allopurinol (ZYLOPRIM) 300 MG tablet Take 1 tablet (300 mg total) by mouth daily. 08/14/14   [name] D Filbert Schilder, NP  amiodarone (PACERONE) 200 MG tablet Take 0.5 tablets (100 mg total) by mouth daily. 01/11/15   Abelino Derrick, PA-C  aspirin EC 81 MG EC  tablet Take 1 tablet (81 mg total) by mouth daily. 12/16/13   Aundria Rud, NP  carvedilol (COREG) 6.25 MG tablet Take 1 tablet (6.25 mg total) by mouth 2 (two) times daily with a meal. 10/19/14   Marinus Maw, MD  cetirizine (ZYRTEC) 10 MG tablet Take 10 mg by mouth daily as needed for allergies.     Historical Provider, MD  cyclobenzaprine (FLEXERIL) 10 MG tablet Take 10 mg by mouth 3 (three) times daily as needed for muscle spasms.     Historical Provider, MD  digoxin (LANOXIN) 0.125 MG tablet Take 0.5 tablets (0.0625 mg  total) by mouth daily. Patient taking differently: Take 0.125 mg by mouth daily.  01/01/15   [name] D Filbert Schilder, NP  ferrous sulfate 325 (65 FE) MG tablet Take 325 mg by mouth 3 (three) times daily.    Historical Provider, MD  gabapentin (NEURONTIN) 300 MG capsule Take 600 mg by mouth 4 (four) times daily.    Historical Provider, MD  HYDROcodone-acetaminophen (NORCO) 10-325 MG per tablet Take 1 tablet by mouth every 4 (four) hours as needed for moderate pain.  11/11/13   Historical Provider, MD  insulin aspart (NOVOLOG) 100 UNIT/ML injection Inject 0-20 Units into the skin 3 (three) times daily with meals. 06/28/12   Gareth Morgan, MD  insulin detemir (LEVEMIR) 100 UNIT/ML injection Inject 100 Units into the skin at bedtime.     Historical Provider, MD  isosorbide-hydrALAZINE (BIDIL) 20-37.5 MG per tablet Take 0.5 tablets by mouth 3 (three) times daily. 12/24/14   Laurey Morale, MD  ivabradine (CORLANOR) 5 MG TABS tablet Take 0.5 tablets (2.5 mg total) by mouth 2 (two) times daily with a meal. 08/21/14   Dolores Patty, MD  losartan (COZAAR) 25 MG tablet Take 1 tablet (25 mg total) by mouth 2 (two) times daily. 01/11/15   Abelino Derrick, PA-C  magnesium oxide (MAG-OX) 400 (241.3 MG) MG tablet Take 1 tablet (400 mg total) by mouth daily. 01/01/15   [name] D Filbert Schilder, NP  metolazone (ZAROXOLYN) 2.5 MG tablet Take 1 tablet (2.5 mg total) by mouth once a week. Take one tablet by mouth 30 minutes  before taking morning dose of Torsemide on Tuesdays 01/11/15   Abelino Derrick, PA-C  omeprazole (PRILOSEC) 40 MG capsule Take 1 capsule (40 mg total) by mouth daily. 03/27/13   Marinus Maw, MD  potassium chloride SA (K-DUR,KLOR-CON) 20 MEQ tablet Take 3 tablets (60 mEq total) by mouth 2 (two) times daily. Take an extra 60 meq of potassium on Metolazone days. 01/01/15   [name] D Filbert Schilder, NP  spironolactone (ALDACTONE) 25 MG tablet Take 1 tablet (25 mg total) by mouth 2 (two) times daily. 01/01/15   [name] D Clegg, NP  temazepam (RESTORIL) 30 MG capsule Take 30 mg by mouth at bedtime.    Historical Provider, MD  torsemide (DEMADEX) 20 MG tablet Take 4 tablets (80 mg total) by mouth 2 (two) times daily. 11/23/14   Laurey Morale, MD  vitamin E 400 UNIT capsule Take 400 Units by mouth daily.    Historical Provider, MD   BP 113/73 mmHg  Pulse 79  Resp 26  Ht 5\' 5"  (1.651 m)  Wt 268 lb (121.564 kg)  BMI 44.60 kg/m2  SpO2 93%  LMP 06/21/2012 Physical Exam  Constitutional: She appears well-developed and well-nourished.  HENT:  Head: Normocephalic and atraumatic.  Eyes: Conjunctivae are normal. Pupils are equal, round, and reactive to light.  Neck: Neck supple. No tracheal deviation present. No thyromegaly present.  Cardiovascular: Normal rate and regular rhythm.   No murmur heard. Pulmonary/Chest: Effort normal and breath sounds normal.  Abdominal: Soft. Bowel sounds are normal. She exhibits no distension. There is tenderness.  Morbidly obese mildly tender epigastrium  Genitourinary: Guaiac negative stool.  Rectal normal tone brown stool Hemoccult negative  Musculoskeletal: Normal range of motion. She exhibits no edema or tenderness.  Neurological: She is alert. Coordination normal.  Skin: Skin is warm and dry. No rash noted.  Psychiatric: She has a normal mood and affect.  Nursing note and vitals reviewed.   ED  Course  Procedures (including critical care time) Labs Review Labs Reviewed  CBC  WITH DIFFERENTIAL/PLATELET  BASIC METABOLIC PANEL  BRAIN NATRIURETIC PEPTIDE  I-STAT TROPOININ, ED  POC URINE PREG, ED    Imaging Review No results found.   EKG Interpretation   Date/Time:  Friday Jan 15 2015 20:16:34 EDT Ventricular Rate:  82 PR Interval:  94 QRS Duration: 174 QT Interval:  498 QTC Calculation: 581 R Axis:   178 Text Interpretation:  Atrial-sensed ventricular-paced rhythm Biventricular  pacemaker detected Abnormal ECG No significant change since last tracing  Confirmed by Ethelda Chick  MD, [place] [phone]) on 01/15/2015 8:49:29 PM     Results for orders placed or performed during the hospital encounter of 01/15/15  CBC with Differential  Result Value Ref Range   WBC 8.1 4.0 - 10.5 K/uL   RBC 4.70 3.87 - 5.11 MIL/uL   Hemoglobin 14.1 12.0 - 15.0 g/dL   HCT 56.3 87.5 - 64.3 %   MCV 90.0 78.0 - 100.0 fL   MCH 30.0 26.0 - 34.0 pg   MCHC 33.3 30.0 - 36.0 g/dL   RDW 32.9 51.8 - 84.1 %   Platelets 289 150 - 400 K/uL   Neutrophils Relative % 50 43 - 77 %   Neutro Abs 4.0 1.7 - 7.7 K/uL   Lymphocytes Relative 40 12 - 46 %   Lymphs Abs 3.2 0.7 - 4.0 K/uL   Monocytes Relative 6 3 - 12 %   Monocytes Absolute 0.5 0.1 - 1.0 K/uL   Eosinophils Relative 4 0 - 5 %   Eosinophils Absolute 0.3 0.0 - 0.7 K/uL   Basophils Relative 0 0 - 1 %   Basophils Absolute 0.0 0.0 - 0.1 K/uL  Basic metabolic panel  Result Value Ref Range   Sodium 135 135 - 145 mmol/L   Potassium 4.7 3.5 - 5.1 mmol/L   Chloride 95 (L) 101 - 111 mmol/L   CO2 28 22 - 32 mmol/L   Glucose, Bld 196 (H) 70 - 99 mg/dL   BUN 32 (H) 6 - 20 mg/dL   Creatinine, Ser 6.60 (H) 0.44 - 1.00 mg/dL   Calcium 8.9 8.9 - 63.0 mg/dL   GFR calc non Af Amer 42 (L) >60 mL/min   GFR calc Af Amer 48 (L) >60 mL/min   Anion gap 12 5 - 15  BNP (order ONLY if patient complains of dyspnea/SOB AND you have documented it for THIS visit)  Result Value Ref Range   B Natriuretic Peptide 30.8 0.0 - 100.0 pg/mL  Lipase, blood  Result  Value Ref Range   Lipase 35 22 - 51 U/L  I-stat troponin, ED  (not at [facility], [facility])  Result Value Ref Range   Troponin i, poc 0.00 0.00 - 0.08 ng/mL   Comment 3          POC Urine Pregnancy, ED (pre-menopausal females)  not at [facility]  Result Value Ref Range   Preg Test, Ur NEGATIVE NEGATIVE  I-stat troponin, ED  Result Value Ref Range   Troponin i, poc 0.00 0.00 - 0.08 ng/mL   Comment 3          POC occult blood, ED Provider will collect  Result Value Ref Range   Fecal Occult Bld NEGATIVE NEGATIVE   Ct Abdomen Pelvis Wo Contrast  01/15/2015   CLINICAL DATA:  Epigastric tenderness.  EXAM: CT ABDOMEN AND PELVIS WITHOUT CONTRAST  TECHNIQUE: Multidetector CT imaging of the abdomen and pelvis was performed following  the standard protocol without IV contrast.  COMPARISON:  06/22/2012  FINDINGS: There are unremarkable unenhanced appearances of the liver, spleen, pancreas, adrenals and kidneys.  There is cholecystectomy.  There is no bile duct dilatation.  Bowel is unremarkable.  The abdominal aorta is normal in caliber without atherosclerotic calcification.  Uterus and ovaries are remarkable only for an indeterminate 2.4 cm hyperdense cyst in the left adnexal region which may be hemorrhagic.  No acute inflammatory changes are evident in the abdomen or pelvis. There is no ascites. There is no adenopathy.  There is no significant abnormality in the lower chest. There is minimal linear scarring or atelectasis in the left lung base. No significant musculoskeletal abnormality is evident. Incidental note is made of a small fat containing umbilical hernia. There are a few small subcutaneous nodular opacities in the anterior abdominal tissues which might represent injection sites.  IMPRESSION: *No acute findings are evident in the abdomen or pelvis. *Hyperdense 2.4 cm left adnexal cyst, likely hemorrhagic. Sonography would be helpful for characterization, if additional imaging is clinically warranted. *Small fat  containing umbilical hernia   Electronically Signed   By: Ellery Plunk M.D.   On: 01/15/2015 23:13   Dg Chest 2 View  12/21/2014   CLINICAL DATA:  Shortness of breath, chest discomfort for tonight.  EXAM: CHEST  2 VIEW  COMPARISON:  08/06/2014  FINDINGS: Multi lead left-sided pacemaker remains in place. Cardiomediastinal contours and cardiomegaly are unchanged. Limited assessment of the left lung base due to soft tissue attenuation. Mild vascular congestion. No pulmonary edema, confluent airspace disease, pleural effusion or pneumothorax. No acute osseous abnormalities are seen.  IMPRESSION: Stable cardiomegaly.  Mild vascular congestion.   Electronically Signed   By: Rubye Oaks M.D.   On: 12/21/2014 00:19   Dg Chest Port 1 View  01/04/2015   CLINICAL DATA:  Shortness of breath 1 day.  EXAM: PORTABLE CHEST - 1 VIEW  COMPARISON:  12/29/2014 and 12/20/2014  FINDINGS: Left-sided pacemaker unchanged. Lungs are hypoinflated with moderate bilateral hazy perihilar opacification likely interstitial edema. Hazy left base opacification unchanged as cannot exclude left-sided effusion, atelectasis or infection. Stable cardiomegaly. Remainder the exam is unchanged.  IMPRESSION: Bilateral perihilar opacification slightly worse likely interstitial edema.  Stable left base opacification which may be due to effusion/atelectasis or infection.  Stable cardiomegaly.   Electronically Signed   By: Elberta Fortis M.D.   On: 01/04/2015 20:17   Dg Chest Portable 1 View  12/29/2014   CLINICAL DATA:  Shortness of breath, AICD probable.  EXAM: PORTABLE CHEST - 1 VIEW  COMPARISON:  12/20/2014  FINDINGS: Left chest wall battery pack with incompletely imaged lead projecting over the expected location of the right ventricle the epicardial and right atrial lead positions are grossly similar. Cardiac enlargement. Central vascular congestion. Interstitial prominence. Retrocardiac opacity. Small effusions not excluded. No  pneumothorax. Osteopenia. Multilevel degenerative change.  IMPRESSION: Enlarged cardiac silhouette and central vascular congestion.  Interstitial prominence may reflect pulmonary edema (favored) or atypical infection.   Electronically Signed   By: Jearld Lesch M.D.   On: 12/29/2014 02:59    MDM  Patient is mildly hypotensive, renal function slightly worse than 2 days ago. i've consult cardiology, Dr. Allena Katz to evaluate patient, and aid in her disposition given her poor ejection fraction. Ovarian cyst and umbilical hernia not of clinical significance.  Pt signed out to Dr. Norlene Campbell at  1245 am Dx#1 epigastric pain #2 hypotension #3 ovarian cyst #4 umbilical hernia #5 renal  insufficiency Final diagnoses:  None        Doug Sou, MD 01/16/15 [phone]

## 2015-01-15 NOTE — Telephone Encounter (Signed)
Patient with PMH of NICM s/p BiV ICD 2007 [facility] [employer]), chronic systolic HF, DM2, HTN, recurrent VT, morbid obesity and OSA on CPAP contacted the after hour service as she states she was going to pass out multiple times today. She denies any CP, however endorse dyspnea. She was recently admitted for HF and has a h/o VT s/p ICD on amiodarone.   Given her presyncopal symptom, I have advised her to seek medical attention via EMS.   Ramond Dial PA Pager: [phone]

## 2015-01-15 NOTE — ED Notes (Signed)
The patient said she has been having SOB from earlier today and she also started having chest pain.  The patient said she takes a lot of medications and she is nauseated but does not know if it is coming from the medications or something else.  She also says she is constipated worse than normal.  She rates her pain 7/10.  The patient does have a pacemaker/defib but it has not fired.  She was seen here four days ago for the same thing.

## 2015-01-15 NOTE — ED Notes (Signed)
BiV ICD check in ED.  Normal device function.  No new episodes since shock events on Rondalyn Belford 18.  Sensitivity:  A:  1.90mV, RV:  19.70mV, LV:  3.59mV Impedance:  A:  487, RV:  493, LV:  1142 Threshold: A:  0.6V@0 .51ms RV:  0.7V@0 .7ms LV:  1.4V@0 .56ms Shock impedance:  [address]  [employer] [phone]

## 2015-01-15 NOTE — ED Notes (Signed)
MD aware of pt BP  

## 2015-01-15 NOTE — ED Notes (Signed)
Readjusted BP cuff and placed on left arm

## 2015-01-16 DIAGNOSIS — R1013 Epigastric pain: Secondary | ICD-10-CM | POA: Diagnosis not present

## 2015-01-16 LAB — I-STAT TROPONIN, ED: Troponin i, poc: 0 ng/mL (ref 0.00–0.08)

## 2015-01-16 NOTE — Discharge Instructions (Signed)
Continue taking your medications as prescribed.  Follow-up with your doctors.  Return to the emergency department for worsening condition or new concerning symptoms.

## 2015-01-16 NOTE — ED Provider Notes (Signed)
Care assumed from Dr. Rennis Chris at change of shift.  Patient is awaiting evaluation by cardiology.  Case discussed with cardiology feels that she is stable for discharge home.  Thought to be near syncope due to Valsalva with BM.  Patient has follow-up scheduled.  Patient is ambulated, blood pressure is better.  Marisa Severin, MD 01/16/15 [phone]

## 2015-01-16 NOTE — Consult Note (Signed)
CARDIOLOGY ADMISSION/CONSULT NOTE  Assessment and Plan:  *Dizziness: likely orthostatic.   [name] is a pleasant 49 year old female with history of NICM s/p BIVICD, history of VT, hypertension, type 2 diabetes and CKD comes to the emergency department after having an episode of dizziness. This episode was triggered immediately after having to strain for a bowel movement. Her ICD was interrogated in the emergency department and showed no evidence of recurrent arrhythmia. I suspect her episode today was likely orthostatic in nature. She is currently feeling well and endorses no symptoms of shortness of breath, dizziness, lightheadedness. She is requesting to go home. She initially had slightly low blood pressure (1 reading of 86/62)  but rest of the blood pressure readings in the emergency department are reassuring at baseline with systolics were than 100 mmHg. Her EKG and routine lab work looks reassuring without any evidence of shock. She is ambulating well without any assistance in the emergency department. Currently, I don't see a reason why she needs to be admitted to the hospital.  Certainly, patient was asked to come to the hospital immediately with any new or progressive symptoms. Patient has an appointment with Dr. [name] on Monday.   *Constipation: From constipation standpoint, I requested the patient take MiraLAX up to 3-4 times a day. Certainly, patient was asked to discontinue MiraLAX immediately with any symptoms of diarrhea as electrolyte abnormalities in the past have triggered ventricular arrhythmias.  Chief complaint: dizziness  HPI:  [name] is a pleasant 49 year old female with history of nonischemic cardiomyopathy status post bi-V ICD Conservation officer, historic buildings) with NYHA class II symptoms, history of VT, hypertension, type 2 diabetes comes to the emergency department after having an episode of dizziness. Patient was recently discharged from the hospital on 01/11/2015 after  being admitted with multiple ICD discharges in the setting of ventricular tachycardia thought to be induced by electrolyte abnormalities. She had a right heart cath that showed elevated filling pressures with borderline cardiac index. She was continued and progressed onheart failure therapy regimen. Since discharge, she has been doing fairly well with mild shortness of breath with exertion but no orthopnea, PND or lower extremity edema. She has history of IBS and since the hospitalization has had constipation. She states that she has been taking MiraLAX, Colace and warm prune juice without any relief. The constipation has been fairly severe over past 2 days. She states that she has been straining very hard with each bowel movement and has small amount of hard stools. Today, she was again in the bathroom straining to have a bowel movement. Immediately after standing up, patient noticed wooziness and dizziness and funny sensation around her ears. She contacted the on-call cardiology person who asked her to come to the hospital due to her history of arrhythmias. Her ICD was interrogated in the emergency department by [employer] who found no evidence of recurrent arrhythmias. Currently on my evaluation, patient feels fairly well without any shortness of breath, dizziness, lightheadedness at rest. She states that she would like to go home as she has appointments with Dr. [name] on Monday.   Past Medical History Past Medical History  Diagnosis Date  . CHF (congestive heart failure)     a. EF 30-35%, RV mildly dilated (difficult study 11/2013) b. RHC (12/2013): RA 38/37 (35), RV 90/28, PA 95/51 (71), PCWP 54, PA 40%, AO 96 %, CO/CI Fick 3.2/1.4, PVR 5.3   . Nonischemic cardiomyopathy 12/2013    a. LHC (12/2013) normal coronary anatomy   .  Mitral regurgitation     moderate to severe  . S/P cardiac catheterization   . LBBB (left bundle branch block)     s/p [employer] CRT-D  . HTN  (hypertension)   . Asthma   . IBS (irritable bowel syndrome)     with primarily constipation  . Morbid obesity   . GERD (gastroesophageal reflux disease)   . IDA (iron deficiency anemia)     2o TO SB AVMS, Hx parenteral iron Dr Mariel Sleet  . AVM (arteriovenous malformation)     Small bowel, s/p duble balloon enteroscopy/APC jejunum Dr Gwinda Passe [facility]) 01/23/2011  . Anemia, chronic disease   . Peripheral neuropathy   . OSA (obstructive sleep apnea)     on CPAP qhs  . GI bleed 06/2009    Hgb 7.5, ferritin 7, transfusion, iron IV  . Dyspnea on exertion     chronic  . Diabetes mellitus without complication   . ICD (implantable cardioverter-defibrillator), single, in situ 07/28/2014 interrogation    Pt shocked 3x since 07/20/14---pt intentionally unplugged Latitude, remote received 07/27/14 once plugged in. Pt unaware of shock on 07/27/14. Thresholds and sensing consistent with previous device measurements. Lead impedance trends stable over time.     Allergies: Allergies  Allergen Reactions  . Cymbalta [Duloxetine Hcl] Other (See Comments)    "Spontaneous type behavior"  . Trazodone And Nefazodone Other (See Comments)    Nightmares   . Lyrica [Pregabalin] Swelling    Social History History   Social History  . Marital Status: Single    Spouse Name: [name]  . Number of Children: 1  . Years of Education: [name]   Occupational History  . disabled    Social History Main Topics  . Smoking status: Former Smoker -- 0.50 packs/day for 20 years    Types: Cigarettes    Quit date: 11/19/2009  . Smokeless tobacco: Former Neurosurgeon  . Alcohol Use: No  . Drug Use: No  . Sexual Activity: Yes    Birth Control/ Protection: Implant   Other Topics Concern  . 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   Single- 1 daughter age 17.    Does not get regular exercise.     Medications: No current facility-administered medications for this encounter.    Current Outpatient Prescriptions  Medication Sig Dispense Refill  . albuterol (PROVENTIL HFA;VENTOLIN HFA) 108 (90 BASE) MCG/ACT inhaler Inhale 2 puffs into the lungs every 6 (six) hours as needed for wheezing or shortness of breath.    . allopurinol (ZYLOPRIM) 300 MG tablet Take 1 tablet (300 mg total) by mouth daily. 30 tablet 6  . amiodarone (PACERONE) 200 MG tablet Take 0.5 tablets (100 mg total) by mouth daily. 60 tablet 3  . aspirin EC 81 MG EC tablet Take 1 tablet (81 mg total) by mouth daily. 30 tablet 3  . carvedilol (COREG) 6.25 MG tablet Take 1 tablet (6.25 mg total) by mouth 2 (two) times daily with a meal. 60 tablet 3  . cetirizine (ZYRTEC) 10 MG tablet Take 10 mg by mouth daily as needed for allergies.     . cyclobenzaprine (FLEXERIL) 10 MG tablet Take 10 mg by mouth 3 (three) times daily as needed for muscle spasms.     . digoxin (LANOXIN) 0.125 MG tablet Take 0.5 tablets (0.0625 mg total) by mouth daily. 90 tablet 3  . ferrous sulfate 325 (65 FE) MG tablet Take 325 mg by mouth 3 (  three) times daily.    Marland Kitchen gabapentin (NEURONTIN) 300 MG capsule Take 600 mg by mouth 4 (four) times daily.    . insulin aspart (NOVOLOG) 100 UNIT/ML injection Inject 0-20 Units into the skin 3 (three) times daily with meals. (Patient taking differently: Inject 0-20 Units into the skin 3 (three) times daily with meals. Per sliding scale) 1 vial 5  . insulin detemir (LEVEMIR) 100 UNIT/ML injection Inject 100 Units into the skin at bedtime.     . isosorbide-hydrALAZINE (BIDIL) 20-37.5 MG per tablet Take 0.5 tablets by mouth 3 (three) times daily. 45 tablet 3  . ivabradine (CORLANOR) 5 MG TABS tablet Take 0.5 tablets (2.5 mg total) by mouth 2 (two) times daily with a meal. 60 tablet 2  . losartan (COZAAR) 25 MG tablet Take 1 tablet (25 mg total) by mouth 2 (two) times daily. 60 tablet 11  . magnesium oxide (MAG-OX) 400 (241.3 MG) MG tablet Take 1 tablet (400 mg total) by mouth daily. 30 tablet 6  .  metolazone (ZAROXOLYN) 2.5 MG tablet Take 1 tablet (2.5 mg total) by mouth once a week. Take one tablet by mouth 30 minutes before taking morning dose of Torsemide on Tuesdays 10 tablet 3  . omeprazole (PRILOSEC) 40 MG capsule Take 1 capsule (40 mg total) by mouth daily. 30 capsule 6  . oxyCODONE-acetaminophen (PERCOCET) 10-325 MG per tablet Take 1 tablet by mouth every 4 (four) hours as needed for pain.    . potassium chloride SA (K-DUR,KLOR-CON) 20 MEQ tablet Take 3 tablets (60 mEq total) by mouth 2 (two) times daily. Take an extra 60 meq of potassium on Metolazone days. 190 tablet 3  . spironolactone (ALDACTONE) 25 MG tablet Take 1 tablet (25 mg total) by mouth 2 (two) times daily. 60 tablet 3  . temazepam (RESTORIL) 30 MG capsule Take 30 mg by mouth at bedtime.    . torsemide (DEMADEX) 20 MG tablet Take 4 tablets (80 mg total) by mouth 2 (two) times daily. 180 tablet 3  . vitamin E 400 UNIT capsule Take 400 Units by mouth daily.    Marland Kitchen acetaminophen (TYLENOL) 325 MG tablet Take 2 tablets (650 mg total) by mouth every 4 (four) hours as needed for headache or mild pain. (Patient not taking: Reported on 01/15/2015)     Family History Family History  Problem Relation Age of Onset  . Colon cancer Neg Hx     no family Hx of polyps too, uncle  . Cervical cancer Mother   . Hypertension Mother   . Cancer Mother   . Heart disease Father   . Hypertension Father   . GER disease Father   . Heart attack Father   . Bleeding Disorder Father   . Diabetes Sister   . Heart disease Sister   . Hypertension Sister     Physical Exam Filed Vitals:   01/16/15 0006  BP:  108/68   Pulse:  70   Temp:  afebrile   Resp:  16    Gen: comfortable appearing  HEENT: moist mucous membranes  Neck: difficult to evaluate jugular venous distention due to thick neck  CV: distant heart sounds, regular, likely normal S1 and S2 no obvious gallops Pulm: clear to auscultation bilaterally posteriorly, normal work of  breathing  Abdomen: soft, nontender, nondistended, obese  Ext: no edema   Labs:  Results for orders placed or performed during the hospital encounter of 01/15/15 (from the past 24 hour(s))  CBC with Differential  Status: None   Collection Time: 01/15/15  8:32 PM  Result Value Ref Range   WBC 8.1 4.0 - 10.5 K/uL   RBC 4.70 3.87 - 5.11 MIL/uL   Hemoglobin 14.1 12.0 - 15.0 g/dL   HCT 40.9 81.1 - 91.4 %   MCV 90.0 78.0 - 100.0 fL   MCH 30.0 26.0 - 34.0 pg   MCHC 33.3 30.0 - 36.0 g/dL   RDW 78.2 95.6 - 21.3 %   Platelets 289 150 - 400 K/uL   Neutrophils Relative % 50 43 - 77 %   Neutro Abs 4.0 1.7 - 7.7 K/uL   Lymphocytes Relative 40 12 - 46 %   Lymphs Abs 3.2 0.7 - 4.0 K/uL   Monocytes Relative 6 3 - 12 %   Monocytes Absolute 0.5 0.1 - 1.0 K/uL   Eosinophils Relative 4 0 - 5 %   Eosinophils Absolute 0.3 0.0 - 0.7 K/uL   Basophils Relative 0 0 - 1 %   Basophils Absolute 0.0 0.0 - 0.1 K/uL  Basic metabolic panel     Status: Abnormal   Collection Time: 01/15/15  8:32 PM  Result Value Ref Range   Sodium 135 135 - 145 mmol/L   Potassium 4.7 3.5 - 5.1 mmol/L   Chloride 95 (L) 101 - 111 mmol/L   CO2 28 22 - 32 mmol/L   Glucose, Bld 196 (H) 70 - 99 mg/dL   BUN 32 (H) 6 - 20 mg/dL   Creatinine, Ser 0.86 (H) 0.44 - 1.00 mg/dL   Calcium 8.9 8.9 - 57.8 mg/dL   GFR calc non Af Amer 42 (L) >60 mL/min   GFR calc Af Amer 48 (L) >60 mL/min   Anion gap 12 5 - 15  BNP (order ONLY if patient complains of dyspnea/SOB AND you have documented it for THIS visit)     Status: None   Collection Time: 01/15/15  8:32 PM  Result Value Ref Range   B Natriuretic Peptide 30.8 0.0 - 100.0 pg/mL  I-stat troponin, ED  (not at [facility])     Status: None   Collection Time: 01/15/15  8:36 PM  Result Value Ref Range   Troponin i, poc 0.00 0.00 - 0.08 ng/mL   Comment 3          Lipase, blood     Status: None   Collection Time: 01/15/15  9:25 PM  Result Value Ref Range   Lipase 35 22 - 51 U/L  I-stat  troponin, ED     Status: None   Collection Time: 01/15/15  9:27 PM  Result Value Ref Range   Troponin i, poc 0.00 0.00 - 0.08 ng/mL   Comment 3          POC occult blood, ED Provider will collect     Status: None   Collection Time: 01/15/15 10:27 PM  Result Value Ref Range   Fecal Occult Bld NEGATIVE NEGATIVE  POC Urine Pregnancy, ED (pre-menopausal females)  not at [facility]     Status: None   Collection Time: 01/15/15 10:30 PM  Result Value Ref Range   Preg Test, Ur NEGATIVE NEGATIVE  I-stat troponin, ED     Status: None   Collection Time: 01/16/15 12:49 AM  Result Value Ref Range   Troponin i, poc 0.00 0.00 - 0.08 ng/mL   Comment 3

## 2015-01-16 NOTE — ED Notes (Signed)
Pt ambulated in hallway. Pt did not complain of dizziness or lightheadedness. Pt did have to use handrail on return to room

## 2015-01-17 ENCOUNTER — Encounter: Payer: Self-pay | Admitting: Nurse Practitioner

## 2015-01-17 NOTE — Progress Notes (Signed)
Electrophysiology Office Note Date: 01/18/2015  ID:  [name], DOB [birth year], MRN [id number]  PCP: [name], MD Primary Cardiologist: [name] Electrophysiologist: [name]  CC: Post hospital ICD follow-up  [name] is a 49 y.o. female is seen today for Dr [name].  She presents today for post hospital electrophysiology followup. She was admitted 4/18 with multiple ICD shocks for PMVT in the setting of hypokalemia.  Her electrolytes were repleted and she has not had recurrent VT since discharge.  She did have some dizziness in the setting of straining during a bowel movement for which she was evaluated in the ER 5/6.  Device interrogation demonstrated no recurrent arrhthymias and she was advised to follow up today as an outpatient.  Since discharge, the patient reports doing reasonably well. She denies chest pain, palpitations, PND, orthopnea, nausea, vomiting, dizziness, syncope,  weight gain, or early satiety.  She has not had ICD shocks.   Her dyspnea and LE edema have been stable.   Device History: BSX CRTD implanted 2013 for non-ischemic cardiomyopathy, LBBB History of appropriate therapy: yes History of AAD therapy: yes - amiodarone   Past Medical History  Diagnosis Date  . CHF (congestive heart failure)     a. EF 30-35%, RV mildly dilated (difficult study 11/2013) b. RHC (12/2013): RA 38/37 (35), RV 90/28, PA 95/51 (71), PCWP 54, PA 40%, AO 96 %, CO/CI Fick 3.2/1.4, PVR 5.3   . Nonischemic cardiomyopathy 12/2013    a. LHC (12/2013) normal coronary anatomy   . Mitral regurgitation     moderate to severe  . LBBB (left bundle branch block)   . HTN (hypertension)   . Asthma   . IBS (irritable bowel syndrome)     with primarily constipation  . Morbid obesity   . GERD (gastroesophageal reflux disease)   . IDA (iron deficiency anemia)     2o TO SB AVMS, Hx parenteral iron Dr Mariel Sleet  . AVM (arteriovenous malformation)     Small bowel, s/p duble balloon  enteroscopy/APC jejunum Dr Gwinda Passe [facility]) 01/23/2011  . Peripheral neuropathy   . OSA (obstructive sleep apnea)     on CPAP qhs  . Diabetes mellitus without complication   . Torsades de pointes     a. appropriate ICD therapy 12/2014 in setting of hypokalemia   Past Surgical History  Procedure Laterality Date  . Appendectomy    . Cholecystectomy      biliary dyskinesia  . Mastectomy  2003    left, partial  . Knee arthroscopy  2005    left  . Small bowel enteroscopy  MAY 2012 [facility] DR. GILLIAM    SB AVMS s/p APC  . Colonoscopy  OCT 2010/SEP 2011    tortuous colon, 3 polyps-benign(2010),   . Upper gastrointestinal endoscopy  '08, '10, SEP 2011    mild antral gastritis (2010), negative SB bx (2010), incomlpete Schatzki's ring (2011)  . Small bowel enteroscopy  SEP 2011 [facility]    Fields-NO AVMS  . Givens capsule study  NOV 2010     NO AVMS, normal  . Irrigation and debridement abscess  06/25/2012    Procedure: MINOR INCISION AND DRAINAGE OF ABSCESS;  Surgeon: [name], MD;  Location: [facility] ORS;  Service: General;  Laterality: [name];  Incision & Drainage of Infected Sebaceous Cyst on Chest  . Breast surgery    . Implantable cardioverter defibrillator generator change Left 02/15/2012    BSX CRTD   . Left and right  heart catheterization with coronary/graft angiogram  12/12/2013    Procedure: LEFT AND RIGHT HEART CATHETERIZATION WITH Isabel Caprice;  Surgeon: Peter M Swaziland, MD;  Location: [facility] CATH LAB;  Service: Cardiovascular;;  . Right heart catheterization [name] 01/06/2015    Procedure: RIGHT HEART CATH;  Surgeon: Laurey Morale, MD;  Location: [facility] CATH LAB;  Service: Cardiovascular;  Laterality: [name];    Current Outpatient Prescriptions  Medication Sig Dispense Refill  . acetaminophen (TYLENOL) 325 MG tablet Take 2 tablets (650 mg total) by mouth every 4 (four) hours as needed for headache or mild pain.    Marland Kitchen albuterol (PROVENTIL HFA;VENTOLIN HFA) 108 (90 BASE) MCG/ACT  inhaler Inhale 2 puffs into the lungs every 6 (six) hours as needed for wheezing or shortness of breath.    . allopurinol (ZYLOPRIM) 300 MG tablet Take 1 tablet (300 mg total) by mouth daily. 30 tablet 6  . amiodarone (PACERONE) 200 MG tablet Take 0.5 tablets (100 mg total) by mouth daily. 60 tablet 3  . aspirin EC 81 MG EC tablet Take 1 tablet (81 mg total) by mouth daily. 30 tablet 3  . carvedilol (COREG) 6.25 MG tablet Take 1 tablet (6.25 mg total) by mouth 2 (two) times daily with a meal. 60 tablet 3  . cetirizine (ZYRTEC) 10 MG tablet Take 10 mg by mouth daily as needed for allergies.     . cyclobenzaprine (FLEXERIL) 10 MG tablet Take 10 mg by mouth 3 (three) times daily as needed for muscle spasms.     . digoxin (LANOXIN) 0.125 MG tablet Take 0.5 tablets (0.0625 mg total) by mouth daily. 90 tablet 3  . ferrous sulfate 325 (65 FE) MG tablet Take 325 mg by mouth 3 (three) times daily.    Marland Kitchen gabapentin (NEURONTIN) 300 MG capsule Take 600 mg by mouth 4 (four) times daily.    . insulin aspart (NOVOLOG) 100 UNIT/ML injection Inject 0-20 Units into the skin 3 (three) times daily with meals. (Patient taking differently: Inject 0-20 Units into the skin 3 (three) times daily with meals. Per sliding scale) 1 vial 5  . insulin detemir (LEVEMIR) 100 UNIT/ML injection Inject 100 Units into the skin at bedtime.     . isosorbide-hydrALAZINE (BIDIL) 20-37.5 MG per tablet Take 0.5 tablets by mouth 3 (three) times daily. 45 tablet 3  . ivabradine (CORLANOR) 5 MG TABS tablet Take 0.5 tablets (2.5 mg total) by mouth 2 (two) times daily with a meal. 60 tablet 2  . losartan (COZAAR) 25 MG tablet Take 1 tablet (25 mg total) by mouth 2 (two) times daily. 60 tablet 11  . magnesium oxide (MAG-OX) 400 (241.3 MG) MG tablet Take 1 tablet (400 mg total) by mouth daily. 30 tablet 6  . metolazone (ZAROXOLYN) 2.5 MG tablet Take 1 tablet (2.5 mg total) by mouth once a week. Take one tablet by mouth 30 minutes before taking  morning dose of Torsemide on Tuesdays 10 tablet 3  . omeprazole (PRILOSEC) 40 MG capsule Take 1 capsule (40 mg total) by mouth daily. 30 capsule 6  . oxyCODONE-acetaminophen (PERCOCET) 10-325 MG per tablet Take 1 tablet by mouth every 4 (four) hours as needed for pain.    . potassium chloride SA (K-DUR,KLOR-CON) 20 MEQ tablet Take 3 tablets (60 mEq total) by mouth 2 (two) times daily. Take an extra 60 meq of potassium on Metolazone days. 190 tablet 3  . spironolactone (ALDACTONE) 25 MG tablet Take 1 tablet (25 mg total) by mouth 2 (two) times  daily. 60 tablet 3  . temazepam (RESTORIL) 30 MG capsule Take 30 mg by mouth at bedtime.    . torsemide (DEMADEX) 20 MG tablet Take 4 tablets (80 mg total) by mouth 2 (two) times daily. 180 tablet 3  . vitamin E 400 UNIT capsule Take 400 Units by mouth daily.     No current facility-administered medications for this visit.    Allergies:   Cymbalta; Trazodone and nefazodone; and Lyrica   Social History: History   Social History  . Marital Status: Single    Spouse Name: [name]  . Number of Children: 1  . Years of Education: [name]   Occupational History  . disabled    Social History Main Topics  . Smoking status: Former Smoker -- 0.50 packs/day for 20 years    Types: Cigarettes    Quit date: 11/19/2009  . Smokeless tobacco: Former Neurosurgeon  . Alcohol Use: No  . Drug Use: No  . Sexual Activity: Yes    Birth Control/ Protection: Implant   Other Topics Concern  . 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    Does not get regular exercise.      Family History: Family History  Problem Relation Age of Onset  . Colon cancer Neg Hx     no family Hx of polyps too, uncle  . Cervical cancer Mother   . Hypertension Mother   . Cancer Mother   . Heart disease Father   . Hypertension Father   . GER disease Father   . Heart attack Father   . Bleeding Disorder Father   . Diabetes  Sister   . Heart disease Sister   . Hypertension Sister     Review of Systems: All other systems reviewed and are otherwise negative except as noted above.   Physical Exam: VS:  BP 102/64 mmHg  Pulse 74  Ht  (1.651 m)  Wt 275 lb 12.8 oz (125.102 kg)  BMI 45.90 kg/m2  SpO2 95%  LMP 06/21/2012 , BMI Body mass index is 45.9 kg/(m^2).  GEN- The patient is morbidly obese appearing, alert and oriented x 3 today.   HEENT: normocephalic, atraumatic; sclera clear, conjunctiva pink; hearing intact; oropharynx clear; neck supple Lymph- no cervical lymphadenopathy Lungs- Clear to ausculation bilaterally, decreased breath sounds, normal work of breathing.  No wheezes, rales, rhonchi Heart- Regular rate and rhythm, no murmurs, rubs or gallops GI- obese, non-tender, non-distended, bowel sounds present, no hepatosplenomegaly Extremities- no clubbing, cyanosis, 1+edema; DP/PT/radial pulses 1+ bilaterally MS- no significant deformity or atrophy Skin- warm and dry, no rash or lesion; ICD pocket well healed Psych- euthymic mood, full affect Neuro- strength and sensation are intact  ICD interrogation- reviewed in detail today,  See PACEART report  EKG:  EKG is ordered today. The ekg ordered today shows AV pacing at 70, QTc 563 (stable)  Recent Labs: 08/21/2014: Pro B Natriuretic peptide (BNP) 171.9* 11/23/2014: ALT 19; TSH 4.204 01/04/2015: Magnesium 2.1 01/15/2015: B Natriuretic Peptide 30.8; BUN 32*; Creatinine 1.45*; Hemoglobin 14.1; Platelets 289; Potassium 4.7; Sodium 135   Wt Readings from Last 3 Encounters:  01/18/15 275 lb 12.8 oz (125.102 kg)  01/15/15 268 lb (121.564 kg)  01/11/15 268 lb 9.7 oz (121.84 kg)     Other studies Reviewed: Additional studies/ records that were reviewed today include: recent hospital records  Assessment and Plan:  1.  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  She has not had recurrent ventricular arrhythmias since  discharge. Continue Amiodarone 200mg  daily Recent BMET with stable K Keep K >3.9, Mg >1.9 No driving x6 months (pt aware)  2. Chronic systolic dysfunction euvolemic today Stable on an appropriate medical regimen Normal ICD function See Pace Art report No changes today   Current medicines are reviewed at length with the patient today.   The patient does not have concerns regarding her medicines.  The following changes were made today:  none  Labs/ tests ordered today include: none   Disposition:   Follow up with Dr [name] in 3 months   Signed, Gypsy Balsam, NP 01/18/2015 8:49 AM  [facility] [address] [place] [address] [phone] (office) [phone] (fax

## 2015-01-18 ENCOUNTER — Encounter: Payer: Self-pay | Admitting: Nurse Practitioner

## 2015-01-18 ENCOUNTER — Ambulatory Visit (HOSPITAL_COMMUNITY)
Admission: RE | Admit: 2015-01-18 | Discharge: 2015-01-18 | Disposition: A | Discharge status: Home / Self Care | Payer: Medicare Other | Source: Ambulatory Visit | Attending: Cardiology | Admitting: Cardiology

## 2015-01-18 ENCOUNTER — Ambulatory Visit (INDEPENDENT_AMBULATORY_CARE_PROVIDER_SITE_OTHER): Payer: Medicare Other | Admitting: Nurse Practitioner

## 2015-01-18 VITALS — BP 82/58 | HR 73 | Wt 272.0 lb

## 2015-01-18 VITALS — BP 102/64 | HR 74 | Ht 65.0 in | Wt 275.8 lb

## 2015-01-18 DIAGNOSIS — Z79899 Other long term (current) drug therapy: Secondary | ICD-10-CM | POA: Insufficient documentation

## 2015-01-18 DIAGNOSIS — E119 Type 2 diabetes mellitus without complications: Secondary | ICD-10-CM | POA: Insufficient documentation

## 2015-01-18 DIAGNOSIS — I429 Cardiomyopathy, unspecified: Secondary | ICD-10-CM | POA: Diagnosis not present

## 2015-01-18 DIAGNOSIS — I5022 Chronic systolic (congestive) heart failure: Secondary | ICD-10-CM | POA: Diagnosis not present

## 2015-01-18 DIAGNOSIS — G4733 Obstructive sleep apnea (adult) (pediatric): Secondary | ICD-10-CM | POA: Diagnosis not present

## 2015-01-18 DIAGNOSIS — I10 Essential (primary) hypertension: Secondary | ICD-10-CM | POA: Insufficient documentation

## 2015-01-18 DIAGNOSIS — Z9989 Dependence on other enabling machines and devices: Secondary | ICD-10-CM

## 2015-01-18 DIAGNOSIS — Z794 Long term (current) use of insulin: Secondary | ICD-10-CM | POA: Diagnosis not present

## 2015-01-18 DIAGNOSIS — I472 Ventricular tachycardia, unspecified: Secondary | ICD-10-CM

## 2015-01-18 DIAGNOSIS — Z7982 Long term (current) use of aspirin: Secondary | ICD-10-CM | POA: Insufficient documentation

## 2015-01-18 DIAGNOSIS — R531 Weakness: Secondary | ICD-10-CM | POA: Diagnosis not present

## 2015-01-18 DIAGNOSIS — K219 Gastro-esophageal reflux disease without esophagitis: Secondary | ICD-10-CM | POA: Insufficient documentation

## 2015-01-18 DIAGNOSIS — Z95810 Presence of automatic (implantable) cardiac defibrillator: Secondary | ICD-10-CM | POA: Diagnosis not present

## 2015-01-18 DIAGNOSIS — I4721 Torsades de pointes: Secondary | ICD-10-CM

## 2015-01-18 LAB — CUP PACEART INCLINIC DEVICE CHECK
Date Time Interrogation Session: 20160509120244
Lead Channel Setting Pacing Amplitude: 2 V
Lead Channel Setting Pacing Amplitude: 2.5 V
Lead Channel Setting Pacing Pulse Width: 0.3 ms
Lead Channel Setting Sensing Sensitivity: 0.5 mV
MDC IDC SET LEADCHNL LV PACING PULSEWIDTH: 0.8 ms
MDC IDC SET LEADCHNL LV SENSING SENSITIVITY: 1 mV
MDC IDC SET LEADCHNL RV PACING AMPLITUDE: 2.2 V
MDC IDC SET ZONE DETECTION INTERVAL: 343 ms
Pulse Gen Serial Number: 108094
Zone Setting Detection Interval: 250 ms
Zone Setting Detection Interval: 300 ms

## 2015-01-18 LAB — BASIC METABOLIC PANEL
ANION GAP: 10 (ref 5–15)
BUN: 22 mg/dL — ABNORMAL HIGH (ref 6–20)
CO2: 27 mmol/L (ref 22–32)
Calcium: 8.3 mg/dL — ABNORMAL LOW (ref 8.9–10.3)
Chloride: 100 mmol/L — ABNORMAL LOW (ref 101–111)
Creatinine, Ser: 1.2 mg/dL — ABNORMAL HIGH (ref 0.44–1.00)
GFR, EST NON AFRICAN AMERICAN: 53 mL/min — AB (ref >60)
Glucose, Bld: 172 mg/dL — ABNORMAL HIGH (ref 70–99)
POTASSIUM: 4.3 mmol/L (ref 3.5–5.1)
Sodium: 137 mmol/L (ref 135–145)

## 2015-01-18 LAB — MAGNESIUM: Magnesium: 2.2 mg/dL (ref 1.7–2.4)

## 2015-01-18 MED ORDER — LOSARTAN POTASSIUM 25 MG PO TABS: 25.0000 mg | ORAL_TABLET | Freq: Every day | ORAL | Status: DC

## 2015-01-18 NOTE — Progress Notes (Signed)
Patient ID: [name], female   DOB: 1966/04/01, 49 y.o.   MRN: [id number] EP: Dr [name] PCP: Dr [name]  HPI: [name] is a 49 yo female with a history of NICM s/p BiV ICD 2007 [facility] [employer]), chronic systolic HF, DM2, HTN, recurrent VT, morbid obesity and OSA on CPAP.   Admitted to [facility] 12/08/13 with progressive DOE.and volume overload.  She was transferred from [facility] to [facility] due to acute decompensated HF. ECHO EF 35% by echo but difficult study.  Admitted 08/05/14 after ICD shock for V fib. Also diuresed with IV lasix and transitioned back to torsemide. Also with thyromegaly and had ultrasound with small cysts noted. Discharge weight was 249 pounds.  She had another ICD shock in 12/15 for VT, now on amiodarone 200 mg bid.   She is unable to tolerate Corlanor 5 mg bid but does ok on 2.5 mg bid.   Echo (3/16) with EF 30-35%, severe LV dilation with mild LVH, restrictive diastolic function, moderate MR, mildly decreased RV systolic function, PA systolic pressure 60 mmHg.   She returns for follow up. Ongoing dizziness when she stands. Mild SOB with exertion. Denies PND/Orthopnea. Tries to move around at home. HH following for HHPT. Taking all medications.   RHC 01/06/15 RA mean 18 RV 66/22 PA 66/34, mean 48 PCWP mean 28 Oxygen saturations: PA 55% AO 93% Cardiac Output (Fick) 3.77  Cardiac Index (Fick) 1.8 PVR 5.3 WU  Cardiac Output (Thermo) 4.77 Cardiac Index (Thermo) 2.26 PVR 4.2 WU  [facility] 12/12/13  RA 38/37 (35)  RV 90/28  PA 95/51 (71)  PCWP 57/73 (54)  LV 127/50  AO 123/66 (100)  Oxygen saturations:  PA 40%  AO 96%  Cardiac Output/Index (Fick) 3.2/1.4, PVR 5.3  Normal coronary anatomy  CPX 6/15 FVC 1.34 (42%)  FEV1 1.11 (43%)  FEV1/FVC 83%  MVV 42 (40%) RPE: 19 Resting HR: 80 Peak HR: 115 (67% age predicted max HR) BP rest: 102/70 BP peak: 118/68 Peak VO2: 9.3 (52.9% predicted peak VO2) VE/VCO2 slope: 29.3 OUES: 1.57 Peak RER: 0.95 Ventilatory  Threshold: 7.7 (43.8% predicted peak VO2) Peak RR 44 Peak Ventilation: 32 VE/MVV: 76.2% PETCO2 at peak: 41 O2pulse: 10 (77% predicted O2pulse)  - Echo 12/09/13 EF 35% echo but difficult study.  - Echo (3/16) with EF 30-35%, severe LV dilation with mild LVH, restrictive diastolic function, moderate MR, mildly decreased RV systolic function, PA systolic pressure 60 mmHg.   Labs 11/28/13 Pro BNP 948  Labs 12/23/13 Dig level 0.6, K 4.6,  Creatinine 0.86 , Pro BNP 248 Labs 02/23/14 K 3.2 Cr 0.69 Labs 8/15 K 3.7, creatinine 0.78 Labs 12/15 K 4.5, creatinine 0.98, BNP 172, TSH normal Labs 3/16 TSH normal, LFTs normal, digoxin 0.9 Labs 4/16 K 3.7, creatinine 1.06, BNP 235, HCT 42.8  FH: Father alive with HF   SH: No ETOH and does not smoke, lives in [place] with husband and 42 yr old daughter   ROS: All systems negative except as listed in HPI, PMH and Problem List.  Past Medical History  Diagnosis Date  . CHF (congestive heart failure)     a. EF 30-35%, RV mildly dilated (difficult study 11/2013) b. RHC (12/2013): RA 38/37 (35), RV 90/28, PA 95/51 (71), PCWP 54, PA 40%, AO 96 %, CO/CI Fick 3.2/1.4, PVR 5.3   . Nonischemic cardiomyopathy 12/2013    a. LHC (12/2013) normal coronary anatomy   . Mitral regurgitation     moderate to severe  . LBBB (  left bundle branch block)   . HTN (hypertension)   . Asthma   . IBS (irritable bowel syndrome)     with primarily constipation  . Morbid obesity   . GERD (gastroesophageal reflux disease)   . IDA (iron deficiency anemia)     2o TO SB AVMS, Hx parenteral iron Dr Mariel Sleet  . AVM (arteriovenous malformation)     Small bowel, s/p duble balloon enteroscopy/APC jejunum Dr Gwinda Passe [facility]) 01/23/2011  . Peripheral neuropathy   . OSA (obstructive sleep apnea)     on CPAP qhs  . Diabetes mellitus without complication   . Torsades de pointes     a. appropriate ICD therapy 12/2014 in setting of hypokalemia    Current Outpatient Prescriptions   Medication Sig Dispense Refill  . acetaminophen (TYLENOL) 325 MG tablet Take 2 tablets (650 mg total) by mouth every 4 (four) hours as needed for headache or mild pain.    Marland Kitchen albuterol (PROVENTIL HFA;VENTOLIN HFA) 108 (90 BASE) MCG/ACT inhaler Inhale 2 puffs into the lungs every 6 (six) hours as needed for wheezing or shortness of breath.    . allopurinol (ZYLOPRIM) 300 MG tablet Take 1 tablet (300 mg total) by mouth daily. 30 tablet 6  . amiodarone (PACERONE) 200 MG tablet Take 0.5 tablets (100 mg total) by mouth daily. 60 tablet 3  . aspirin EC 81 MG EC tablet Take 1 tablet (81 mg total) by mouth daily. 30 tablet 3  . carvedilol (COREG) 6.25 MG tablet Take 1 tablet (6.25 mg total) by mouth 2 (two) times daily with a meal. 60 tablet 3  . cetirizine (ZYRTEC) 10 MG tablet Take 10 mg by mouth daily as needed for allergies.     . cyclobenzaprine (FLEXERIL) 10 MG tablet Take 10 mg by mouth 3 (three) times daily as needed for muscle spasms.     . digoxin (LANOXIN) 0.125 MG tablet Take 0.5 tablets (0.0625 mg total) by mouth daily. 90 tablet 3  . ferrous sulfate 325 (65 FE) MG tablet Take 325 mg by mouth 3 (three) times daily.    Marland Kitchen gabapentin (NEURONTIN) 300 MG capsule Take 600 mg by mouth 4 (four) times daily.    . insulin aspart (NOVOLOG) 100 UNIT/ML injection Inject 0-20 Units into the skin 3 (three) times daily with meals. (Patient taking differently: Inject 0-20 Units into the skin 3 (three) times daily with meals. Per sliding scale) 1 vial 5  . insulin detemir (LEVEMIR) 100 UNIT/ML injection Inject 100 Units into the skin at bedtime.     . isosorbide-hydrALAZINE (BIDIL) 20-37.5 MG per tablet Take 0.5 tablets by mouth 3 (three) times daily. 45 tablet 3  . ivabradine (CORLANOR) 5 MG TABS tablet Take 0.5 tablets (2.5 mg total) by mouth 2 (two) times daily with a meal. 60 tablet 2  . losartan (COZAAR) 25 MG tablet Take 1 tablet (25 mg total) by mouth 2 (two) times daily. 60 tablet 11  . magnesium oxide  (MAG-OX) 400 (241.3 MG) MG tablet Take 1 tablet (400 mg total) by mouth daily. 30 tablet 6  . metolazone (ZAROXOLYN) 2.5 MG tablet Take 1 tablet (2.5 mg total) by mouth once a week. Take one tablet by mouth 30 minutes before taking morning dose of Torsemide on Tuesdays 10 tablet 3  . omeprazole (PRILOSEC) 40 MG capsule Take 1 capsule (40 mg total) by mouth daily. 30 capsule 6  . oxyCODONE-acetaminophen (PERCOCET) 10-325 MG per tablet Take 1 tablet by mouth every 4 (four) hours as  needed for pain.    . potassium chloride SA (K-DUR,KLOR-CON) 20 MEQ tablet Take 3 tablets (60 mEq total) by mouth 2 (two) times daily. Take an extra 60 meq of potassium on Metolazone days. 190 tablet 3  . spironolactone (ALDACTONE) 25 MG tablet Take 1 tablet (25 mg total) by mouth 2 (two) times daily. 60 tablet 3  . temazepam (RESTORIL) 30 MG capsule Take 30 mg by mouth at bedtime.    . torsemide (DEMADEX) 20 MG tablet Take 4 tablets (80 mg total) by mouth 2 (two) times daily. 180 tablet 3  . vitamin E 400 UNIT capsule Take 400 Units by mouth daily.     No current facility-administered medications for this encounter.    Filed Vitals:   01/18/15 1429  BP: 82/58  Pulse: 73  Weight: 272 lb (123.378 kg)  SpO2: 96%    Sitting 82/58  Standing 78/58   PHYSICAL EXAM: General: Obese woman. No resp difficulty. husband present HEENT: normal Neck: Thick. JVP 5-6 cm.  Carotids 2+ bilaterally; no bruits. No lymphadenopathy or thryomegaly appreciated. Cor: PMI normal. Regular rate & rhythm. No rubs, 2/6 HSM LLSB.  Unable to palpate pedal pulses.  Lungs: clear Abdomen: obese, soft, nontender, mildly distended. No hepatosplenomegaly. No bruits or masses. Good bowel sounds. Extremities: no cyanosis, clubbing, rash.  R and LLE trace edema.  Neuro: alert & orientedx3, cranial nerves grossly intact. Moves all 4 extremities w/o difficulty. Affect pleasant.  ASSESSMENT & PLAN:  1. Chronic Systolic Heart Failure: Nonischemic  cardiomyopathy, s/p [employer] CRT-D. EF 30-35% (11/2014). CPX in 6/15 showed mild cardiac limitation but main issue seemed to be severe ventilatory limitation due to obesity and restrictive physiology.  NYHA class IIIb symptoms.   SBP low today. Not orthostatic. Continue torsemide 80 mg tiwce a day but will hold metolazone tomorrow.    Tonight hold losartan and bidil.  Can restart bidil in am. Cut back losartan to 25 mg at night starting tomorrow.   Continue current spironolactone, Coreg, digoxin.  Digoxin level ok 3/16.  Check BMET today.  - Reinforced the need and importance of daily weights, a low sodium diet, and fluid restriction (less than 2 L a day). Instructed to call the HF clinic if weight increases more than 3 lbs overnight or 5 lbs in a week.  2. OSA: Using CPAP.  3. Morbid Obesity: She is going to consider bariatric surgery eventually.  Needs watch portions.  4. VT: Continue amiodarone 100 mg daily. Recent LFTs and TSH ok.. Needs at least yearly eye exam.  5. Leg weakness: Difficult to palpate pedal pulses.  Still waiting on ABIs.    Follow up in 1 week.    CLEGG,[name] NP-C  01/18/2015

## 2015-01-18 NOTE — Patient Instructions (Signed)
HOLD Losartan tonight.  Tomorrow resume at reduced dose of 25 mg (1 tablet) at bedtime only.  HOLD Bidil x 1 day.  HOLD Metolazone tomorrow.  Follow up 1 week.  Do the following things EVERYDAY: 1) Weigh yourself in the morning before breakfast. Write it down and keep it in a log. 2) Take your medicines as prescribed 3) Eat low salt foods-Limit salt (sodium) to 2000 mg per day.  4) Stay as active as you can everyday 5) Limit all fluids for the day to less than 2 liters

## 2015-01-18 NOTE — Patient Instructions (Signed)
Medication Instructions: - no changes  Labwork: - none  Procedures/Testing: - none  Follow-Up: - Your physician recommends that you schedule a follow-up appointment in: 3 months with Dr. Taylor  Any Additional Special Instructions Will Be Listed Below (If Applicable). - none  

## 2015-01-19 ENCOUNTER — Encounter: Payer: Self-pay | Admitting: *Deleted

## 2015-01-19 ENCOUNTER — Telehealth: Payer: Self-pay | Admitting: Internal Medicine

## 2015-01-19 ENCOUNTER — Encounter (HOSPITAL_COMMUNITY): Payer: Self-pay | Admitting: *Deleted

## 2015-01-19 ENCOUNTER — Other Ambulatory Visit: Payer: Self-pay | Admitting: *Deleted

## 2015-01-19 ENCOUNTER — Emergency Department (HOSPITAL_COMMUNITY)
Admission: EM | Admit: 2015-01-19 | Discharge: 2015-01-20 | Disposition: A | Discharge status: Home / Self Care | Payer: Medicare Other | Attending: Emergency Medicine | Admitting: Emergency Medicine

## 2015-01-19 DIAGNOSIS — J45909 Unspecified asthma, uncomplicated: Secondary | ICD-10-CM | POA: Diagnosis not present

## 2015-01-19 DIAGNOSIS — I509 Heart failure, unspecified: Secondary | ICD-10-CM | POA: Diagnosis not present

## 2015-01-19 DIAGNOSIS — Z7982 Long term (current) use of aspirin: Secondary | ICD-10-CM | POA: Diagnosis not present

## 2015-01-19 DIAGNOSIS — Z79899 Other long term (current) drug therapy: Secondary | ICD-10-CM | POA: Insufficient documentation

## 2015-01-19 DIAGNOSIS — R109 Unspecified abdominal pain: Secondary | ICD-10-CM

## 2015-01-19 DIAGNOSIS — N3289 Other specified disorders of bladder: Secondary | ICD-10-CM

## 2015-01-19 DIAGNOSIS — K219 Gastro-esophageal reflux disease without esophagitis: Secondary | ICD-10-CM | POA: Diagnosis not present

## 2015-01-19 DIAGNOSIS — R1030 Lower abdominal pain, unspecified: Secondary | ICD-10-CM | POA: Diagnosis present

## 2015-01-19 DIAGNOSIS — Q2730 Arteriovenous malformation, site unspecified: Secondary | ICD-10-CM | POA: Insufficient documentation

## 2015-01-19 DIAGNOSIS — R0989 Other specified symptoms and signs involving the circulatory and respiratory systems: Secondary | ICD-10-CM

## 2015-01-19 DIAGNOSIS — I10 Essential (primary) hypertension: Secondary | ICD-10-CM | POA: Diagnosis not present

## 2015-01-19 DIAGNOSIS — E119 Type 2 diabetes mellitus without complications: Secondary | ICD-10-CM | POA: Insufficient documentation

## 2015-01-19 DIAGNOSIS — G629 Polyneuropathy, unspecified: Secondary | ICD-10-CM | POA: Diagnosis not present

## 2015-01-19 DIAGNOSIS — R0600 Dyspnea, unspecified: Secondary | ICD-10-CM | POA: Insufficient documentation

## 2015-01-19 DIAGNOSIS — D509 Iron deficiency anemia, unspecified: Secondary | ICD-10-CM | POA: Diagnosis not present

## 2015-01-19 DIAGNOSIS — Z87891 Personal history of nicotine dependence: Secondary | ICD-10-CM | POA: Insufficient documentation

## 2015-01-19 DIAGNOSIS — Z794 Long term (current) use of insulin: Secondary | ICD-10-CM | POA: Diagnosis not present

## 2015-01-19 LAB — URINALYSIS, ROUTINE W REFLEX MICROSCOPIC
BILIRUBIN URINE: NEGATIVE
Glucose, UA: NEGATIVE mg/dL
HGB URINE DIPSTICK: NEGATIVE
Ketones, ur: NEGATIVE mg/dL
Leukocytes, UA: NEGATIVE
Nitrite: NEGATIVE
Protein, ur: NEGATIVE mg/dL
Specific Gravity, Urine: 1.015 (ref 1.005–1.030)
UROBILINOGEN UA: 0.2 mg/dL (ref 0.0–1.0)
pH: 5.5 (ref 5.0–8.0)

## 2015-01-19 NOTE — ED Notes (Signed)
Sob for 1.5 hours.  And feels she is unable to urinate. Discomfort is low abd

## 2015-01-19 NOTE — ED Provider Notes (Signed)
CSN: [id number]     Arrival date & time 01/19/15  2228 History   First MD Initiated Contact with Patient 01/19/15 2308    This chart was scribed for Azalia Bilis, MD by Marica Otter, ED Scribe. This patient was seen in room APA01/APA01 and the patient's care was started at 12:07 AM.  Chief Complaint  Patient presents with  . Shortness of Breath   The history is provided by the patient. No language interpreter was used.   PCP: [name], MD HPI Comments: [name] is a 49 y.o. female, with PMH noted below including CHF with hx of nonischemic cardiomyopathy who presents complaining of low abdominal discomfort, urinary urgency and incontinence earlier in the evening. She also reports mild SOB onset 1.5 hours ago.  pt notes intermittent episodes of urinary incontinence at baseline. She does report some back pain with left side being worse than right, onset tonight. Reports nausea without vomiting. denies: diarrhea; fever; chills; productive cough. No chest pain. Normal coronary arteries on left heart cath 2015. Foley placed prior to my arrival with only approx 140cc of urine in the bag.   Past Medical History  Diagnosis Date  . CHF (congestive heart failure)     a. EF 30-35%, RV mildly dilated (difficult study 11/2013) b. RHC (12/2013): RA 38/37 (35), RV 90/28, PA 95/51 (71), PCWP 54, PA 40%, AO 96 %, CO/CI Fick 3.2/1.4, PVR 5.3   . Nonischemic cardiomyopathy 12/2013    a. LHC (12/2013) normal coronary anatomy   . Mitral regurgitation     moderate to severe  . LBBB (left bundle branch block)   . HTN (hypertension)   . Asthma   . IBS (irritable bowel syndrome)     with primarily constipation  . Morbid obesity   . GERD (gastroesophageal reflux disease)   . IDA (iron deficiency anemia)     2o TO SB AVMS, Hx parenteral iron Dr Mariel Sleet  . AVM (arteriovenous malformation)     Small bowel, s/p duble balloon enteroscopy/AP[facility] jejunum Dr Gwinda Passe [facility] [place]) 01/23/2011  . Peripheral  neuropathy   . OSA (obstructive sleep apnea)     on CPAP qhs  . Diabetes mellitus without complication   . Torsades de pointes     a. appropriate ICD therapy 12/2014 in setting of hypokalemia   Past Surgical History  Procedure Laterality Date  . Appendectomy    . Cholecystectomy      biliary dyskinesia  . Mastectomy  2003    left, partial  . Knee arthroscopy  2005    left  . Small bowel enteroscopy  MAY 2012 [facility] DR. GILLIAM    SB AVMS s/p APC  . Colonoscopy  OCT 2010/SEP 2011    tortuous colon, 3 polyps-benign(2010),   . Upper gastrointestinal endoscopy  '08, '10, SEP 2011    mild antral gastritis (2010), negative SB bx (2010), incomlpete Schatzki's ring (2011)  . Small bowel enteroscopy  SEP 2011 [facility]    Fields-NO AVMS  . Givens capsule study  NOV 2010     NO AVMS, normal  . Irrigation and debridement abscess  06/25/2012    Procedure: MINOR INCISION AND DRAINAGE OF ABSCESS;  Surgeon: [name], MD;  Location: [facility];  Service: General;  Laterality: [name];  Incision & Drainage of Infected Sebaceous Cyst on Chest  . Breast surgery    . Implantable cardioverter defibrillator generator change Left 02/15/2012    BSX CRTD   . Left and right heart  catheterization with coronary/graft angiogram  12/12/2013    Procedure: LEFT AND RIGHT HEART CATHETERIZATION WITH Isabel Caprice;  Surgeon: Peter M Swaziland, MD;  Location: [facility] CATH LAB;  Service: Cardiovascular;;  . Right heart catheterization [name] 01/06/2015    Procedure: RIGHT HEART CATH;  Surgeon: Laurey Morale, MD;  Location: [facility] C[facility] LAB;  Service: Cardiovascular;  Laterality: [name];   Family History  Problem Relation Age of Onset  . Colon cancer Neg Hx     no family Hx of polyps too, uncle  . Cervical cancer Mother   . Hypertension Mother   . Cancer Mother   . Heart disease Father   . Hypertension Father   . GER disease Father   . Heart attack Father   . Bleeding Disorder Father   . Diabetes Sister   . Heart  disease Sister   . Hypertension Sister    History  Substance Use Topics  . Smoking status: Former Smoker -- 0.50 packs/day for 20 years    Types: Cigarettes    Quit date: 11/19/2009  . Smokeless tobacco: Former Neurosurgeon  . Alcohol Use: No   OB History    Gravida Para Term Preterm AB TAB SAB Ectopic Multiple Living   Review of Systems  A complete 10 system review of systems was obtained and all systems are negative except as noted in the HPI and PMH.    Allergies  Cymbalta; Trazodone and nefazodone; and Lyrica  Home Medications   Prior to Admission medications   Medication Sig Start Date End Date Taking? Authorizing Provider  acetaminophen (TYLENOL) 325 MG tablet Take 2 tablets (650 mg total) by mouth every 4 (four) hours as needed for headache or mild pain. 01/11/15  Yes Luke K Kilroy, PA-C  albuterol (PROVENTIL HFA;VENTOLIN HFA) 108 (90 BASE) MCG/ACT inhaler Inhale 2 puffs into the lungs every 6 (six) hours as needed for wheezing or shortness of breath.   Yes Historical Provider, MD  allopurinol (ZYLOPRIM) 300 MG tablet Take 1 tablet (300 mg total) by mouth daily. 08/14/14  Yes Np, NP  amiodarone (PACERONE) 200 MG tablet Take 0.5 tablets (100 mg total) by mouth daily. 01/11/15  Yes Abelino Derrick, PA-C  aspirin EC 81 MG EC tablet Take 1 tablet (81 mg total) by mouth daily. 12/16/13  Yes Np, NP  carvedilol (COREG) 6.25 MG tablet Take 1 tablet (6.25 mg total) by mouth 2 (two) times daily with a meal. 10/19/14  Yes Marinus Maw, MD  cyclobenzaprine (FLEXERIL) 10 MG tablet Take 10 mg by mouth 3 (three) times daily as needed for muscle spasms.    Yes Historical Provider, MD  digoxin (LANOXIN) 0.125 MG tablet Take 0.5 tablets (0.0625 mg total) by mouth daily. 01/01/15  Yes Np, NP  ferrous sulfate 325 (65 FE) MG tablet Take 325 mg by mouth 3 (three) times daily.   Yes Historical Provider, MD  gabapentin (NEURONTIN) 300 MG capsule Take 600 mg by mouth 4 (four) times daily.   Yes  Historical Provider, MD  insulin aspart (NOVOLOG) 100 UNIT/ML injection Inject 0-20 Units into the skin 3 (three) times daily with meals. Patient taking differently: Inject 0-20 Units into the skin 3 (three) times daily with meals. Per sliding scale 06/28/12  Yes Gareth Morgan, MD  insulin detemir (LEVEMIR) 100 UNIT/ML injection Inject 100 Units into the skin at bedtime.    Yes Historical Provider, MD  isosorbide-hydrALAZINE (BIDIL)  20-37.5 MG per tablet Take 0.5 tablets by mouth 3 (three) times daily. 12/24/14  Yes Laurey Morale, MD  ivabradine (CORLANOR) 5 MG TABS tablet Take 0.5 tablets (2.5 mg total) by mouth 2 (two) times daily with a meal. 08/21/14  Yes Dolores Patty, MD  magnesium oxide (MAG-OX) 400 (241.3 MG) MG tablet Take 1 tablet (400 mg total) by mouth daily. 01/01/15  Yes Np, NP  metolazone (ZAROXOLYN) 2.5 MG tablet Take 1 tablet (2.5 mg total) by mouth once a week. Take one tablet by mouth 30 minutes before taking morning dose of Torsemide on Tuesdays 01/11/15  Yes Luke K Kilroy, PA-C  omeprazole (PRILOSEC) 40 MG capsule Take 1 capsule (40 mg total) by mouth daily. 03/27/13  Yes Marinus Maw, MD  oxyCODONE-acetaminophen (PERCOCET) 10-325 MG per tablet Take 0.5-1 tablets by mouth every 4 (four) hours as needed for pain.    Yes Historical Provider, MD  potassium chloride SA (K-DUR,KLOR-CON) 20 MEQ tablet Take 3 tablets (60 mEq total) by mouth 2 (two) times daily. Take an extra 60 meq of potassium on Metolazone days. 01/01/15  Yes Np, NP  spironolactone (ALDACTONE) 25 MG tablet Take 1 tablet (25 mg total) by mouth 2 (two) times daily. 01/01/15  Yes Np, NP  temazepam (RESTORIL) 30 MG capsule Take 30 mg by mouth at bedtime.   Yes Historical Provider, MD  torsemide (DEMADEX) 20 MG tablet Take 4 tablets (80 mg total) by mouth 2 (two) times daily. 11/23/14  Yes Laurey Morale, MD  vitamin E 400 UNIT capsule Take 400 Units by mouth daily.   Yes Historical Provider, MD  losartan (COZAAR) 25 MG  tablet Take 1 tablet (25 mg total) by mouth at bedtime. 01/18/15   Np, NP   Triage Vitals: BP 122/87 mmHg  Pulse 94  Temp(Src) 98.7 F (37.1 C) (Oral)  Resp 22  Ht  (1.651 m)  Wt 271 lb (122.925 kg)  BMI 45.10 kg/m2  SpO2 90%  LMP 01/19/2014 Physical Exam  Constitutional: She is oriented to person, place, and time. She appears well-developed and well-nourished.  HENT:  Head: Normocephalic.  Eyes: EOM are normal.  Neck: Normal range of motion.  Pulmonary/Chest: Tachypnea noted. She has no rhonchi. She has rales (bilaterally  ).  Abdominal: She exhibits no distension. There is tenderness (diffused upper abd tenderness). There is no rebound and no guarding.  Musculoskeletal: Normal range of motion. She exhibits tenderness (tenderness to lower lumbar spine).  Neurological: She is alert and oriented to person, place, and time.  Psychiatric: She has a normal mood and affect.  Nursing note and vitals reviewed.   ED Course  Procedures (including critical care time)  Angiocath insertion Performed by: Lyanne Co Consent: Verbal consent obtained. Risks and benefits: risks, benefits and alternatives were discussed Time out: Immediately prior to procedure a "time out" was called to verify the correct patient, procedure, equipment, support staff and site/side marked as required. Preparation: Patient was prepped and draped in the usual sterile fashion. Vein Location: left external jugular Gauge: 20 Normal blood return and flush without difficulty Patient tolerance: Patient tolerated the procedure well with no immediate complications.     DIAGNOSTIC STUDIES: Oxygen Saturation is 90% on RA, low by my interpretation.    COORDINATION OF CARE: 12:19 AM-Discussed treatment plan which includes meds, labs, EKG, imaging, and catheter placement with pt at bedside and pt agreed to plan.   Labs Review Labs Reviewed  COMPREHENSIVE METABOLIC PANEL - Abnormal; Notable  for the following:     Glucose, Bld 178 (*)    BUN 26 (*)    Creatinine, Ser 1.33 (*)    Calcium 8.5 (*)    Total Protein 9.1 (*)    GFR calc non Af Amer 46 (*)    GFR calc Af Amer 54 (*)    All other components within normal limits  BRAIN NATRIURETIC PEPTIDE - Abnormal; Notable for the following:    B Natriuretic Peptide 166.0 (*)    All other components within normal limits  LIPASE, BLOOD - Abnormal; Notable for the following:    Lipase 21 (*)    All other components within normal limits  URINALYSIS, ROUTINE W REFLEX MICROSCOPIC  CBC WITH DIFFERENTIAL/PLATELET  TROPONIN I   BUN  Date Value Ref Range Status  01/20/2015 26* 6 - 20 mg/dL Final  16/06/9603 22* 6 - 20 mg/dL Final  54/05/8118 32* 6 - 20 mg/dL Final  14/78/2956 33* 6 - 20 mg/dL Final   CREAT  Date Value Ref Range Status  02/06/2012 0.83 0.50 - 1.10 mg/dL Final   CREATININE, SER  Date Value Ref Range Status  01/20/2015 1.33* 0.44 - 1.00 mg/dL Final  21/30/8657 8.46* 0.44 - 1.00 mg/dL Final  96/29/5284 1.32* 0.44 - 1.00 mg/dL Final  44/09/270 5.36* 0.44 - 1.00 mg/dL Final      Imaging Review Ct Abdomen Pelvis Wo Contrast  01/20/2015   CLINICAL DATA:  Low abdominal discomfort, urinary urgency and incontinence beginning this evening. Mild shortness of breath. RIGHT flank pain. Nausea. History of CHF, morbid obesity, diabetes.  EXAM: CT ABDOMEN AND PELVIS WITHOUT CONTRAST  TECHNIQUE: Multidetector CT imaging of the abdomen and pelvis was performed following the standard protocol without IV contrast.  COMPARISON:  None.  FINDINGS: Large body habitus results in overall noisy image quality.  LUNG BASES: Included view of the lungs demonstrate LEFT lower lobe atelectasis. Stable moderate cardiomegaly incompletely imaged with pacer wires in place. No pericardial fluid collections.  KIDNEYS/BLADDER: Kidneys are orthotopic, demonstrating normal size and morphology. No nephrolithiasis, hydronephrosis; limited assessment for renal masses on this  nonenhanced examination. The unopacified ureters are normal in course and caliber. Urinary bladder is decompressed containing a Foley bulb and air.  SOLID ORGANS: The liver is mildly enlarged, otherwise unremarkable by noncontrast CT. Spleen, pancreas and adrenal glands are unremarkable for this non-contrast examination. Status post cholecystectomy.  GASTROINTESTINAL TRACT: Small hiatal hernia. The stomach, small and large bowel are normal in course and caliber without inflammatory changes. Enteric contrast has not yet reached the distal small bowel. The appendix is not discretely identified, however there are no inflammatory changes in the right lower quadrant.  PERITONEUM/RETROPERITONEUM: Aortoiliac vessels are normal in course and caliber. No lymphadenopathy by CT size criteria. Internal reproductive organs are nonsuspicious, 2 cm mildly dense LEFT adnexae a probable degenerating hemorrhagic cyst, smaller than prior study. No intraperitoneal free fluid nor free air. RIGHT upper and LEFT lower quadrant dropped clips.  SOFT TISSUES/ OSSEOUS STRUCTURES: Nonsuspicious. Small fat containing umbilical hernia. Moderate degenerative change of the hips.  IMPRESSION: Habitus limited examination without acute intra-abdominal or pelvic process. Decompressed urinary bladder with Foley catheter. No urolithiasis or obstructive uropathy.  Mild hepatomegaly.  Status post cholecystectomy.   Electronically Signed   By: Awilda Metro   On: 01/20/2015 03:19   Dg Chest 2 View  01/20/2015   CLINICAL DATA:  Shortness of breath, congestion, and wheezing for 1.5 hours.  EXAM: CHEST  2 VIEW  COMPARISON:  01/04/2015  FINDINGS: Cardiac pacemaker. Shallow inspiration. Cardiac enlargement with pulmonary vascular congestion. Probable mild perihilar airspace edema, improved since prior study. No blunting of costophrenic angles. No pneumothorax. Degenerative changes in the spine.  IMPRESSION: Cardiac enlargement with mild vascular  congestion and perihilar edema.   Electronically Signed   By: Burman Nieves M.D.   On: 01/20/2015 00:57  I personally reviewed the imaging tests through PACS system I reviewed available ER/hospitalization records through the EMR    EKG Interpretation   Date/Time:  Wednesday Jan 20 2015 00:21:06 EDT Ventricular Rate:  88 PR Interval:  121 QRS Duration: 156 QT Interval:  467 QTC Calculation: 565 R Axis:   -149 Text Interpretation:  Atrial-sensed ventricular-paced rhythm Biventricular  pacemaker detected No significant change was found Confirmed by Ewell Benassi   MD, Zell Hylton ([id number]) on 01/20/2015 2:53:46 AM      MDM   Final diagnoses:  None    Baseline renal insufficiency.  CT scan of her abdomen without acute pathology.  Some of her symptoms may be related more to bladder spasm.  Initially catheter was placed.  Catheter remained in place while in the emergency department as she was provided IV diuresis for what appears to be rather acute vascular congestion.  After diuresis in the emergency department where she diuresed nearly 600 mL is she feels much better.  Her breathing is near baseline at this time.  She has a history of ischemic cardiomyopathy and history of congestive heart failure with a recent hospitalization for acute on chronic congestive heart failure.  She feels better would like to go home at this time.  Peridium for spasm.  Discharge home in good condition.  Home without catheter in place.  I've instructed the patient to follow-up with her primary care physician and return to the ER for any new or worsening symptoms..   I personally performed the services described in this documentation, which was scribed in my presence. The recorded information has been reviewed and is accurate.      Azalia Bilis, MD 01/20/15 [phone]

## 2015-01-19 NOTE — Telephone Encounter (Signed)
Received page to call Mrs Bridwell.  She reports that she is currently unable to pass urine.  States this just occurred, has never happened before.  Currently taking 80 mg of torsemide in the AM and about 5 pm.  Has made plenty of urine throughout the day and states "I'm full" but cannot void as of about an hour ago.  No other significant symptoms such as dysuria, fever, etc.  She has been hospitalized recently for CHF exacerbations and had foley a few weeks ago but no difficulty with urinating after it was discontinued during that admission.  I advised her to go to local ED/UCC for eval and she stated she would go to [facility] ED.  Called and spoke with ED triage RN.    Codi Kertz 01/19/2015 10:26 PM

## 2015-01-19 NOTE — Patient Outreach (Signed)
[facility]) Care Management  01/19/2015  [name] 23-Aug-1966 [id number]  [name] was referred to [facility] Care Management from the hospital during her admission for CHF 12/28/14-12/31/13. I was able to reach her after a few attempts on 01/04/15. We scheduled a home visit for 01/06/15 but [name] was rehospitalized late on the evening of 01/04/15. She was discharged on 01/11/15. I reached out to her on 01/12/15 but was unsuccessful in contacting her. [name] went to the ED on Friday evening 01/15/15 when she had an episode of dizziness. She was released from the hospital ED on 01/16/15. I was able to reach her today for transition of care assessment. She reports feeling better but says her bp is too low. She saw Tonye Becket NP in the Heart Failure Clinic today who ordered the following medication changes which [name] was able to explain to me accurately:   1. Continue Torsemide 80mg  BID 2. Hold Metolazone on 01/20/15. Resume 01/21/15.  3. Hold Losartan 01/19/15. Restart on 01/20/15 at dose 25mg . 4. Hold Losartan 01/19/15. Restart Bidil BID in am 01/20/15.   [name] has a home health nurse from [facility] who started visits last week. [name] is to see Tonye Becket at the HF Clinic next week on Tuesday. I will see [name] at home on Friday.   During our phone conversation today we reviewed in details signs and symptoms of CHF exacerbation, hypotension, and when to call for help/when to report to ED. We reviewed her medications in detail. I provided my contact information to [name] and advised her to call with any questions or concerns.    Marja Kays MHA,BSN,RN,CCM [facility] Care Management  [phone]

## 2015-01-20 ENCOUNTER — Other Ambulatory Visit: Payer: Self-pay | Admitting: *Deleted

## 2015-01-20 ENCOUNTER — Emergency Department (HOSPITAL_COMMUNITY): Payer: Medicare Other

## 2015-01-20 ENCOUNTER — Telehealth (HOSPITAL_COMMUNITY): Payer: Self-pay | Admitting: Vascular Surgery

## 2015-01-20 ENCOUNTER — Encounter: Payer: Self-pay | Admitting: *Deleted

## 2015-01-20 DIAGNOSIS — N3289 Other specified disorders of bladder: Secondary | ICD-10-CM | POA: Diagnosis not present

## 2015-01-20 LAB — CBC WITH DIFFERENTIAL/PLATELET
Basophils Absolute: 0 10*3/uL (ref 0.0–0.1)
Basophils Relative: 1 % (ref 0–1)
EOS PCT: 3 % (ref 0–5)
Eosinophils Absolute: 0.2 10*3/uL (ref 0.0–0.7)
HCT: 44.7 % (ref 36.0–46.0)
Hemoglobin: 14.4 g/dL (ref 12.0–15.0)
LYMPHS PCT: 32 % (ref 12–46)
Lymphs Abs: 2.7 10*3/uL (ref 0.7–4.0)
MCH: 29.7 pg (ref 26.0–34.0)
MCHC: 32.2 g/dL (ref 30.0–36.0)
MCV: 92.2 fL (ref 78.0–100.0)
MONO ABS: 0.3 10*3/uL (ref 0.1–1.0)
MONOS PCT: 4 % (ref 3–12)
NEUTROS ABS: 5.2 10*3/uL (ref 1.7–7.7)
Neutrophils Relative %: 60 % (ref 43–77)
Platelets: 283 10*3/uL (ref 150–400)
RBC: 4.85 MIL/uL (ref 3.87–5.11)
RDW: 14.3 % (ref 11.5–15.5)
WBC: 8.5 10*3/uL (ref 4.0–10.5)

## 2015-01-20 LAB — COMPREHENSIVE METABOLIC PANEL
ALK PHOS: 85 U/L (ref 38–126)
ALT: 40 U/L (ref 14–54)
AST: 33 U/L (ref 15–41)
Albumin: 4 g/dL (ref 3.5–5.0)
Anion gap: 9 (ref 5–15)
BUN: 26 mg/dL — ABNORMAL HIGH (ref 6–20)
CO2: 27 mmol/L (ref 22–32)
Calcium: 8.5 mg/dL — ABNORMAL LOW (ref 8.9–10.3)
Chloride: 101 mmol/L (ref 101–111)
Creatinine, Ser: 1.33 mg/dL — ABNORMAL HIGH (ref 0.44–1.00)
GFR, EST AFRICAN AMERICAN: 54 mL/min — AB (ref >60)
GFR, EST NON AFRICAN AMERICAN: 46 mL/min — AB (ref >60)
GLUCOSE: 178 mg/dL — AB (ref 70–99)
Potassium: 4.8 mmol/L (ref 3.5–5.1)
SODIUM: 137 mmol/L (ref 135–145)
Total Bilirubin: 0.4 mg/dL (ref 0.3–1.2)
Total Protein: 9.1 g/dL — ABNORMAL HIGH (ref 6.5–8.1)

## 2015-01-20 LAB — BRAIN NATRIURETIC PEPTIDE: B Natriuretic Peptide: 166 pg/mL — ABNORMAL HIGH (ref 0.0–100.0)

## 2015-01-20 LAB — LIPASE, BLOOD: Lipase: 21 U/L — ABNORMAL LOW (ref 22–51)

## 2015-01-20 LAB — TROPONIN I: Troponin I: <0.03 ng/mL (ref <0.031)

## 2015-01-20 MED ORDER — MORPHINE SULFATE 4 MG/ML IJ SOLN
4.0000 mg | Freq: Once | INTRAMUSCULAR | Status: AC
  Administered 2015-01-20: 4 mg via INTRAVENOUS
  Filled 2015-01-20: qty 1

## 2015-01-20 MED ORDER — FUROSEMIDE 10 MG/ML IJ SOLN
60.0000 mg | Freq: Once | INTRAMUSCULAR | Status: AC
  Administered 2015-01-20: 60 mg via INTRAVENOUS
  Filled 2015-01-20: qty 6

## 2015-01-20 MED ORDER — PHENAZOPYRIDINE HCL 200 MG PO TABS: 200.0000 mg | ORAL_TABLET | Freq: Three times a day (TID) | ORAL | Status: DC

## 2015-01-20 MED ORDER — ONDANSETRON HCL 4 MG/2ML IJ SOLN
4.0000 mg | Freq: Once | INTRAMUSCULAR | Status: AC
  Administered 2015-01-20: 4 mg via INTRAVENOUS
  Filled 2015-01-20: qty 2

## 2015-01-20 NOTE — Patient Outreach (Signed)
[facility]) Care Management  01/20/2015  [name] [birth year] [id number]  I received a call from [name] yesterday reporting difficulty with urination and a "strange sensation" when she tried to sit on the toilet to void. She described the sensation as "stomach ache and dizziness like I'm going to pass out." She reported having "stood in the middle of the floor and pee'd all over myself" rather than try to sit on the toilet and void because "it always makes me feel like I'm going to go out." She denies having the described sensation when she stands to void and said she has done this several times to avoid the "strange sensation." [name] stated she checked her bp, pulse, and O2 sat after having the unusual sensation and they were "all normal." I advised that she report to the ED if she experienced a decline in vital signs (we discussed recommended parameters), if she had chest pain or acute shortness of breath, syncope, or other significantly worsened symptom. Otherwise, I advised that she wait until morning and we would discuss with her primary care provider.   [name] presented to the ED last evening around 10:30 complaining of urinary hesitancy, was cathed for 600cc, and released to home.   This morning I udpated [name] Clegg NP at the Heart Failure clinic as she follows [name] closely and I contacted Sarah at Dr. Michelle Nasuti office to update her and request an appointment for [name]. Sarah arranged an appointment for [name] to see Dr. [name] on 02/12/15 at 2pm. [name] has been to the Heart Failure clinic and has had a defib follow up appointment since her discharge from the hospital. She is being followed at home by the home health nurse from [facility]. She will see Tonye Becket NP in the clinic again on 01/26/15 and I will see her at home on 01/29/15.    Marja Kays MHA,BSN,RN,CCM [facility] Care Management  [phone]

## 2015-01-20 NOTE — Telephone Encounter (Signed)
Pt left message with answering service last night she is having problems pasting urine.. Please advise

## 2015-01-20 NOTE — Telephone Encounter (Signed)
Pt spoke w/on call PA and was evaluated in ER last night

## 2015-01-22 ENCOUNTER — Telehealth (HOSPITAL_COMMUNITY): Payer: Self-pay | Admitting: Vascular Surgery

## 2015-01-22 NOTE — Telephone Encounter (Signed)
Huntley Dec from [facility] called pt is still experiencing  dizziness and nausea, pt is still hold bp meds.Marland Kitchen bp 104/62 today.. Nurse wants to know what she needs to do.. Please advise

## 2015-01-22 NOTE — Telephone Encounter (Signed)
Spoke w/Sara, she states pt is still c/o feeling dizzy, BP 104/62 pt has been holding Losartan and Bidil since she was seen on Mon, she was advised to only hold them 1 day however pt hasn't taken them b/c she is still dizzy.  She states pt is 269.4 today was 271 5/10 and 265 5/5, she denies edema and SOB and states lungs are CTA.  Pt was seen in ER earlier this week and all labs were normal.  She really feels the dizziness could be from vertigo as pt also c/o nausea when she is dizzy and a burning sensation in her ears, gave her verbal order for PT consult to eval for vertigo.  Pt is sch to see [place] in the clinic on Tue

## 2015-01-26 ENCOUNTER — Encounter (HOSPITAL_COMMUNITY): Payer: Self-pay

## 2015-01-26 ENCOUNTER — Ambulatory Visit (HOSPITAL_COMMUNITY)
Admission: RE | Admit: 2015-01-26 | Discharge: 2015-01-26 | Disposition: A | Discharge status: Home / Self Care | Payer: Medicare Other | Source: Ambulatory Visit | Attending: Internal Medicine | Admitting: Internal Medicine

## 2015-01-26 VITALS — BP 112/74 | HR 72 | Wt 273.1 lb

## 2015-01-26 DIAGNOSIS — I5022 Chronic systolic (congestive) heart failure: Secondary | ICD-10-CM | POA: Diagnosis present

## 2015-01-26 DIAGNOSIS — I10 Essential (primary) hypertension: Secondary | ICD-10-CM

## 2015-01-26 DIAGNOSIS — R5383 Other fatigue: Secondary | ICD-10-CM | POA: Diagnosis not present

## 2015-01-26 DIAGNOSIS — I472 Ventricular tachycardia: Secondary | ICD-10-CM | POA: Insufficient documentation

## 2015-01-26 DIAGNOSIS — R42 Dizziness and giddiness: Secondary | ICD-10-CM

## 2015-01-26 DIAGNOSIS — M6281 Muscle weakness (generalized): Secondary | ICD-10-CM | POA: Diagnosis not present

## 2015-01-26 DIAGNOSIS — G4733 Obstructive sleep apnea (adult) (pediatric): Secondary | ICD-10-CM | POA: Diagnosis not present

## 2015-01-26 DIAGNOSIS — Z9989 Dependence on other enabling machines and devices: Secondary | ICD-10-CM

## 2015-01-26 LAB — BASIC METABOLIC PANEL
ANION GAP: 11 (ref 5–15)
BUN: 14 mg/dL (ref 6–20)
CALCIUM: 9.1 mg/dL (ref 8.9–10.3)
CHLORIDE: 98 mmol/L — AB (ref 101–111)
CO2: 29 mmol/L (ref 22–32)
CREATININE: 1.21 mg/dL — AB (ref 0.44–1.00)
GFR calc Af Amer: >60 mL/min (ref >60)
GFR calc non Af Amer: 52 mL/min — ABNORMAL LOW (ref >60)
Glucose, Bld: 198 mg/dL — ABNORMAL HIGH (ref 65–99)
POTASSIUM: 4.2 mmol/L (ref 3.5–5.1)
Sodium: 138 mmol/L (ref 135–145)

## 2015-01-26 LAB — CBC
HEMATOCRIT: 41.7 % (ref 36.0–46.0)
Hemoglobin: 13.3 g/dL (ref 12.0–15.0)
MCH: 29.2 pg (ref 26.0–34.0)
MCHC: 31.9 g/dL (ref 30.0–36.0)
MCV: 91.4 fL (ref 78.0–100.0)
Platelets: 275 10*3/uL (ref 150–400)
RBC: 4.56 MIL/uL (ref 3.87–5.11)
RDW: 14.5 % (ref 11.5–15.5)
WBC: 5.7 10*3/uL (ref 4.0–10.5)

## 2015-01-26 LAB — BRAIN NATRIURETIC PEPTIDE: B NATRIURETIC PEPTIDE 5: 223.8 pg/mL — AB (ref 0.0–100.0)

## 2015-01-26 MED ORDER — CARVEDILOL 6.25 MG PO TABS: 3.1250 mg | ORAL_TABLET | Freq: Two times a day (BID) | ORAL | Status: DC

## 2015-01-26 NOTE — Progress Notes (Signed)
Patient ID: [name], female   DOB: [birth year], 49 y.o.   MRN: [id number] EP: Dr [name] PCP: Dr [name]  HPI: [name] is a 49 yo female with a history of NICM s/p BiV ICD 2007 [facility]), chronic systolic HF, DM2, HTN, recurrent VT, morbid obesity and OSA on CPAP.   Admitted to [facility] 12/08/13 with progressive DOE.and volume overload.  She was transferred from [facility] to [facility] due to acute decompensated HF. ECHO EF 35% by echo but difficult study.  Admitted 08/05/14 after ICD shock for V fib. Also diuresed with IV lasix and transitioned back to torsemide. Also with thyromegaly and had ultrasound with small cysts noted. Discharge weight was 249 pounds.  She had another ICD shock in 12/15 for VT, now on amiodarone 200 mg bid.   She is unable to tolerate Corlanor 5 mg bid but does ok on 2.5 mg bid.   She returns for follow up. Last visit losartan and bidil placed on hold due to hypotension for one day, however she has not resumed and does not want to restart. Ongoing dizziness/nausea. Limited ambulation at home due to weakness. Weight at home 271-273 pounds. No shocks. Taking all other medications.[name] to limit fluid intake to < 2 liters but eating lots of ice. Poor appetite. On and off nausea.    RHC 01/06/15 RA mean 18 RV 66/22 PA 66/34, mean 48 PCWP mean 28 Oxygen saturations: PA 55% AO 93% Cardiac Output (Fick) 3.77  Cardiac Index (Fick) 1.8 PVR 5.3 WU  Cardiac Output (Thermo) 4.77 Cardiac Index (Thermo) 2.26 PVR 4.2 WU  [facility] 12/12/13  RA 38/37 (35)  RV 90/28  PA 95/51 (71)  PCWP 57/73 (54)  LV 127/50  AO 123/66 (100)  Oxygen saturations:  PA 40%  AO 96%  Cardiac Output/Index (Fick) 3.2/1.4, PVR 5.3  Normal coronary anatomy  CPX 6/15 FVC 1.34 (42%)  FEV1 1.11 (43%)  FEV1/FVC 83%  MVV 42 (40%) RPE: 19 Resting HR: 80 Peak HR: 115 (67% age predicted max HR) BP rest: 102/70 BP peak: 118/68 Peak VO2: 9.3 (52.9% predicted peak VO2) VE/VCO2 slope: 29.3 OUES:  1.57 Peak RER: 0.95 Ventilatory Threshold: 7.7 (43.8% predicted peak VO2) Peak RR 44 Peak Ventilation: 32 VE/MVV: 76.2% PETCO2 at peak: 41 O2pulse: 10 (77% predicted O2pulse)  - Echo 12/09/13 EF 35% echo but difficult study.  - Echo (3/16) with EF 30-35%, severe LV dilation with mild LVH, restrictive diastolic function, moderate MR, mildly decreased RV systolic function, PA systolic pressure 60 mmHg.   Labs 11/28/13 Pro BNP 948  Labs 12/23/13 Dig level 0.6, K 4.6,  Creatinine 0.86 , Pro BNP 248 Labs 02/23/14 K 3.2 Cr 0.69 Labs 8/15 K 3.7, creatinine 0.78 Labs 12/15 K 4.5, creatinine 0.98, BNP 172, TSH normal Labs 3/16 TSH normal, LFTs normal, digoxin 0.9 Labs 4/16 K 3.7, creatinine 1.06, BNP 235, HCT 42.8 Laba 12/21/2014: K 4.8 Creatine 1.33 Labs 01/20/2015: K 4.8 Creatinine 1.33   FH: Father alive with HF   SH: No ETOH and does not smoke, lives in [place] with husband and 29 yr old daughter   ROS: All systems negative except as listed in HPI, PMH and Problem List.  Past Medical History  Diagnosis Date  . CHF (congestive heart failure)     a. EF 30-35%, RV mildly dilated (difficult study 11/2013) b. RHC (12/2013): RA 38/37 (35), RV 90/28, PA 95/51 (71), PCWP 54, PA 40%, AO 96 %, CO/CI Fick 3.2/1.4, PVR 5.3   .  Nonischemic cardiomyopathy 12/2013    a. LHC (12/2013) normal coronary anatomy   . Mitral regurgitation     moderate to severe  . LBBB (left bundle branch block)   . HTN (hypertension)   . Asthma   . IBS (irritable bowel syndrome)     with primarily constipation  . Morbid obesity   . GERD (gastroesophageal reflux disease)   . IDA (iron deficiency anemia)     2o TO SB AVMS, Hx parenteral iron Dr Mariel Sleet  . AVM (arteriovenous malformation)     Small bowel, s/p duble balloon enteroscopy/APC jejunum Dr Gwinda Passe [facility]) 01/23/2011  . Peripheral neuropathy   . OSA (obstructive sleep apnea)     on CPAP qhs  . Diabetes mellitus without complication   . Torsades de pointes      a. appropriate ICD therapy 12/2014 in setting of hypokalemia    Current Outpatient Prescriptions  Medication Sig Dispense Refill  . albuterol (PROVENTIL HFA;VENTOLIN HFA) 108 (90 BASE) MCG/ACT inhaler Inhale 2 puffs into the lungs every 6 (six) hours as needed for wheezing or shortness of breath.    . allopurinol (ZYLOPRIM) 300 MG tablet Take 1 tablet (300 mg total) by mouth daily. 30 tablet 6  . amiodarone (PACERONE) 200 MG tablet Take 0.5 tablets (100 mg total) by mouth daily. 60 tablet 3  . aspirin EC 81 MG EC tablet Take 1 tablet (81 mg total) by mouth daily. 30 tablet 3  . carvedilol (COREG) 6.25 MG tablet Take 1 tablet (6.25 mg total) by mouth 2 (two) times daily with a meal. 60 tablet 3  . cyclobenzaprine (FLEXERIL) 10 MG tablet Take 10 mg by mouth 3 (three) times daily as needed for muscle spasms.     . digoxin (LANOXIN) 0.125 MG tablet Take 0.5 tablets (0.0625 mg total) by mouth daily. 90 tablet 3  . ferrous sulfate 325 (65 FE) MG tablet Take 325 mg by mouth 3 (three) times daily.    Marland Kitchen gabapentin (NEURONTIN) 300 MG capsule Take 600 mg by mouth 4 (four) times daily.    . insulin aspart (NOVOLOG) 100 UNIT/ML injection Inject 0-20 Units into the skin 3 (three) times daily with meals. (Patient taking differently: Inject 0-20 Units into the skin 3 (three) times daily with meals. Per sliding scale) 1 vial 5  . insulin detemir (LEVEMIR) 100 UNIT/ML injection Inject 100 Units into the skin at bedtime.     . ivabradine (CORLANOR) 5 MG TABS tablet Take 0.5 tablets (2.5 mg total) by mouth 2 (two) times daily with a meal. 60 tablet 2  . magnesium oxide (MAG-OX) 400 (241.3 MG) MG tablet Take 1 tablet (400 mg total) by mouth daily. 30 tablet 6  . omeprazole (PRILOSEC) 40 MG capsule Take 1 capsule (40 mg total) by mouth daily. 30 capsule 6  . oxyCODONE-acetaminophen (PERCOCET) 10-325 MG per tablet Take 0.5-1 tablets by mouth every 4 (four) hours as needed for pain.     . phenazopyridine (PYRIDIUM)  200 MG tablet Take 1 tablet (200 mg total) by mouth 3 (three) times daily. 6 tablet 0  . potassium chloride SA (K-DUR,KLOR-CON) 20 MEQ tablet Take 3 tablets (60 mEq total) by mouth 2 (two) times daily. Take an extra 60 meq of potassium on Metolazone days. 190 tablet 3  . spironolactone (ALDACTONE) 25 MG tablet Take 1 tablet (25 mg total) by mouth 2 (two) times daily. 60 tablet 3  . temazepam (RESTORIL) 30 MG capsule Take 30 mg by mouth at bedtime.    Marland Kitchen  torsemide (DEMADEX) 20 MG tablet Take 4 tablets (80 mg total) by mouth 2 (two) times daily. 180 tablet 3  . vitamin E 400 UNIT capsule Take 400 Units by mouth daily.    Marland Kitchen acetaminophen (TYLENOL) 325 MG tablet Take 2 tablets (650 mg total) by mouth every 4 (four) hours as needed for headache or mild pain. (Patient not taking: Reported on 01/26/2015)    . isosorbide-hydrALAZINE (BIDIL) 20-37.5 MG per tablet Take 0.5 tablets by mouth 3 (three) times daily. (Patient not taking: Reported on 01/26/2015) 45 tablet 3  . losartan (COZAAR) 25 MG tablet Take 1 tablet (25 mg total) by mouth at bedtime. (Patient not taking: Reported on 01/26/2015) 30 tablet 11  . metolazone (ZAROXOLYN) 2.5 MG tablet Take 1 tablet (2.5 mg total) by mouth once a week. Take one tablet by mouth 30 minutes before taking morning dose of Torsemide on Tuesdays (Patient not taking: Reported on 01/26/2015) 10 tablet 3   No current facility-administered medications for this encounter.    Filed Vitals:   01/26/15 0912  BP: 112/74  Pulse: 72  Weight: 273 lb 2 oz (123.889 kg)  SpO2: 98%     PHYSICAL EXAM: General: Obese woman. No resp difficulty. husband presenti. Arrived in a wheel chair. Marland Kitchen  HEENT: normal Neck: Thick. JVP 5-6 cm but difficult to assess. Carotids 2+ bilaterally; no bruits. No lymphadenopathy or thryomegaly appreciated. Cor: PMI normal. Regular rate & rhythm. No rubs, 2/6 HSM LLSB.  Unable to palpate pedal pulses.  Lungs: clear Abdomen: obese, soft, nontender, mildly  distended. No hepatosplenomegaly. No bruits or masses. Good bowel sounds. Extremities: no cyanosis, clubbing, rash.  R and LLE no edema.  Neuro: alert & orientedx3, cranial nerves grossly intact. Moves all 4 extremities w/o difficulty. Affect pleasant.  ASSESSMENT & PLAN:  1. Chronic Systolic Heart Failure: Nonischemic cardiomyopathy, s/p [employer] CRT-D. EF 30-35% (11/2014). CPX in 6/15 showed mild cardiac limitation but main issue seemed to be severe ventilatory limitation due to obesity and restrictive physiology.  NYHA class IIIb symptoms.   SBP better today. Continue torsemide 80 mg tiwce a day but will hold metolazone.     Keep off  losartan and bidil , She does not want to restart.  Continue current spironolactone, digoxin.  Digoxin level ok 3/16.  Due to profound fatigue will cut back coreg 3.125 mg twice a day.  -Continue ivabradine 2.5 mg twice a day. She had difficulty with 5 mg twice a day.  - Will need to watch closely. May need another RHC if symptoms persist.  - Reinforced the need and importance of daily weights, a low sodium diet, and fluid restriction (less than 2 L a day). Instructed to call the HF clinic if weight increases more than 3 lbs overnight or 5 lbs in a week.  2. OSA: Using CPAP.  3. Morbid Obesity: She is going to consider bariatric surgery eventually.  Needs watch portions.  4. VT: Continue amiodarone 100 mg daily. Recent LFTs and TSH ok.. Needs at least yearly eye exam.  5. Leg weakness: Difficult to palpate pedal pulses.  Still waiting on ABIs.  6. Dizziness- ongoing difficulty . Refer back to PCP ? May need referral to ENT 7. Fatigue: Check CBC now. Most recent tsh in March ok.    Check CBC, BMET, BNP  Follow up in 1 week.    Jacorie Ernsberger NP-C  01/26/2015

## 2015-01-26 NOTE — Patient Instructions (Signed)
LABS today (bmet bnp cbc)  DECREASE Carvedilol to 3.125 mg ( 1/2 tablet) twice a day.  STOP Losartan and Bidil.  FOLLOW UP in 1 week.  FOLLOW up with PCP about dizziness.

## 2015-01-29 ENCOUNTER — Other Ambulatory Visit: Payer: Self-pay | Admitting: *Deleted

## 2015-01-29 NOTE — Patient Outreach (Signed)
[facility]) Care Management   01/29/2015  SAMARIAH HOKENSON 23-Feb-1966 [id number]  CHELCEE KORPI is an 49 y.o. female  Subjective:   Objective:  Ms. Thakur is a 49 yo lady who lives in [place] with her fiance and her daughter. She has a history of NICM s/p BiV ICD 2007, chronic systolic HF, DM2, HTN, recurrent VT, morbid obesity and OSA on CPAP.   Timeline of recent cardiac issues:  12/20/14: presented to [facility] ED with complaint of progressive DOE; treated for volume overload/decompensated heart failure. Endured multiple AICD discharges during this admission   01/01/15: discharged to home; outreach by [facility] Care Management and scheduled home visit for 01/04/15  01/04/15 - 01/11/15: readmitted to hospital with acute onset/worsening shortness of breath/profound fatigue; decompensated heart failure  01/15/15-01/16/15: -  presented to ED with complaints of shortness of breath, fatigue, lightheadedness; observed/discharged to home  01/17/15: CHF clinic visit   01/19/15: presented to ED reporting urinary hesitancy and feelings of faintness when sitting to urinate; reports she stood in the middle of the floor to urinate x 2 because she didn't feel faint if she remained standing; discharged to home  01/26/15: follow up in CHF clinic   I am seeing Ms. Pasko at home today for our first face to face visit. [facility] CM has been consulted for intensive case management services with focus on CHF.  ROS  Physical Exam  Current Medications:   Current Outpatient Prescriptions  Medication Sig Dispense Refill  . acetaminophen (TYLENOL) 325 MG tablet Take 2 tablets (650 mg total) by mouth every 4 (four) hours as needed for headache or mild pain. (Patient not taking: Reported on 01/26/2015)    . albuterol (PROVENTIL HFA;VENTOLIN HFA) 108 (90 BASE) MCG/ACT inhaler Inhale 2 puffs into the lungs every 6 (six) hours as needed for wheezing or shortness of breath.    . allopurinol (ZYLOPRIM) 300 MG  tablet Take 1 tablet (300 mg total) by mouth daily. 30 tablet 6  . amiodarone (PACERONE) 200 MG tablet Take 0.5 tablets (100 mg total) by mouth daily. 60 tablet 3  . aspirin EC 81 MG EC tablet Take 1 tablet (81 mg total) by mouth daily. 30 tablet 3  . carvedilol (COREG) 6.25 MG tablet Take 0.5 tablets (3.125 mg total) by mouth 2 (two) times daily with a meal. 60 tablet 3  . cyclobenzaprine (FLEXERIL) 10 MG tablet Take 10 mg by mouth 3 (three) times daily as needed for muscle spasms.     . digoxin (LANOXIN) 0.125 MG tablet Take 0.5 tablets (0.0625 mg total) by mouth daily. 90 tablet 3  . ferrous sulfate 325 (65 FE) MG tablet Take 325 mg by mouth 3 (three) times daily.    Marland Kitchen gabapentin (NEURONTIN) 300 MG capsule Take 600 mg by mouth 4 (four) times daily.    . insulin aspart (NOVOLOG) 100 UNIT/ML injection Inject 0-20 Units into the skin 3 (three) times daily with meals. (Patient taking differently: Inject 0-20 Units into the skin 3 (three) times daily with meals. Per sliding scale) 1 vial 5  . insulin detemir (LEVEMIR) 100 UNIT/ML injection Inject 100 Units into the skin at bedtime.     . ivabradine (CORLANOR) 5 MG TABS tablet Take 0.5 tablets (2.5 mg total) by mouth 2 (two) times daily with a meal. 60 tablet 2  . magnesium oxide (MAG-OX) 400 (241.3 MG) MG tablet Take 1 tablet (400 mg total) by mouth daily. 30 tablet 6  . metolazone (ZAROXOLYN) 2.5  MG tablet Take 1 tablet (2.5 mg total) by mouth once a week. Take one tablet by mouth 30 minutes before taking morning dose of Torsemide on Tuesdays (Patient not taking: Reported on 01/26/2015) 10 tablet 3  . omeprazole (PRILOSEC) 40 MG capsule Take 1 capsule (40 mg total) by mouth daily. 30 capsule 6  . oxyCODONE-acetaminophen (PERCOCET) 10-325 MG per tablet Take 0.5-1 tablets by mouth every 4 (four) hours as needed for pain.     . phenazopyridine (PYRIDIUM) 200 MG tablet Take 1 tablet (200 mg total) by mouth 3 (three) times daily. 6 tablet 0  . potassium  chloride SA (K-DUR,KLOR-CON) 20 MEQ tablet Take 3 tablets (60 mEq total) by mouth 2 (two) times daily. Take an extra 60 meq of potassium on Metolazone days. 190 tablet 3  . spironolactone (ALDACTONE) 25 MG tablet Take 1 tablet (25 mg total) by mouth 2 (two) times daily. 60 tablet 3  . temazepam (RESTORIL) 30 MG capsule Take 30 mg by mouth at bedtime.    . torsemide (DEMADEX) 20 MG tablet Take 4 tablets (80 mg total) by mouth 2 (two) times daily. 180 tablet 3  . vitamin E 400 UNIT capsule Take 400 Units by mouth daily.     No current facility-administered medications for this visit.    Functional Status:   In your present state of health, do you have any difficulty performing the following activities: 01/05/2015 01/05/2015  Hearing? - N  Vision? - N  Difficulty concentrating or making decisions? - N  Walking or climbing stairs? - Y  Dressing or bathing? - N  Doing errands, shopping? Y -    Fall/Depression Screening:    PHQ 2/9 Scores 02/27/2014  PHQ - 2 Score 0    Assessment:    Acute/Chronic Health Condition (CHF) - recent discharge from hospital (x2) with CHF/decompensation. Multiple medication changes. Difficulty with adherence to prescribed dietary regimen; she has DOE today and mild shortness of breath with conversation; Ms. Standard is profoundly fatigued today but was able to carry on a conversation with me; she scored > 2 on her PHQ2/9 but says her answers about not being able to participate in usual activites/feeling "down" are related to fatigue associated with her recent exacerbation; she says she "is working hard to get stronger and looking forward to having more energy"  Reinforced the need and importance of daily weights, a low sodium diet, and fluid restriction (less than 2 L a day). Instructed to call the HF clinic if weight increases more than 3 lbs overnight or 5 lbs in a week.   Plan:   Continued close surveillance CHF/transition of care assessments.   [facility] CM Care Plan  Problem One        Patient Outreach from 01/29/2015 in [facility]   Care Plan Problem One  Acute/Chronic Health Condition (CHF) with 4 hospital admissions in last 4 months   Care Plan for Problem One  Active   [facility] Long Term Goal (31-90 days)  patient will not be hospitalized for CHF exacerbation in the next 31 days   [facility] Long Term Goal Start Date  01/19/15   Interventions for Problem One Long Term Goal  collaborated with patient on care plan and long term goal   [facility] CM Short Term Goal #1 (0-30 days)  patient will attend all scheduled provider appointments in the next 30 days as evidenced by provider documentation and patient report   [facility] CM Short Term Goal #1 Start Date  01/19/15   Interventions for Short Term Goal #1  utilizing teachback method, reviewed appointment schedule with patient and transportation arrangements   [facility] CM Short Term Goal #2 (0-30 days)  patient will verbalize signs and symptoms of worsening CHF symptoms and knowledge and who and when to call for help as evidenced by patient report of same to [facility] in the next 30 days   [facility] CM Short Term Goal #2 Start Date  01/19/15   Interventions for Short Term Goal #2  utilizing teachback method and stop light tool, reviewed daily assessment guidelines, signs and symptoms of worsening chf, and who/when to call for help       Marja Kays [facility] Care Management  [phone]

## 2015-02-01 ENCOUNTER — Encounter: Payer: Self-pay | Admitting: Gastroenterology

## 2015-02-02 ENCOUNTER — Ambulatory Visit (HOSPITAL_COMMUNITY)
Admission: RE | Admit: 2015-02-02 | Discharge: 2015-02-02 | Disposition: A | Discharge status: Home / Self Care | Payer: Medicare Other | Source: Ambulatory Visit | Attending: Cardiology | Admitting: Cardiology

## 2015-02-02 ENCOUNTER — Encounter (HOSPITAL_COMMUNITY): Payer: Self-pay

## 2015-02-02 VITALS — BP 114/72 | HR 70 | Wt 268.0 lb

## 2015-02-02 DIAGNOSIS — I447 Left bundle-branch block, unspecified: Secondary | ICD-10-CM | POA: Diagnosis not present

## 2015-02-02 DIAGNOSIS — I340 Nonrheumatic mitral (valve) insufficiency: Secondary | ICD-10-CM | POA: Insufficient documentation

## 2015-02-02 DIAGNOSIS — G4733 Obstructive sleep apnea (adult) (pediatric): Secondary | ICD-10-CM | POA: Insufficient documentation

## 2015-02-02 DIAGNOSIS — Z794 Long term (current) use of insulin: Secondary | ICD-10-CM | POA: Insufficient documentation

## 2015-02-02 DIAGNOSIS — Z7982 Long term (current) use of aspirin: Secondary | ICD-10-CM | POA: Insufficient documentation

## 2015-02-02 DIAGNOSIS — K219 Gastro-esophageal reflux disease without esophagitis: Secondary | ICD-10-CM | POA: Diagnosis not present

## 2015-02-02 DIAGNOSIS — R531 Weakness: Secondary | ICD-10-CM | POA: Diagnosis not present

## 2015-02-02 DIAGNOSIS — R42 Dizziness and giddiness: Secondary | ICD-10-CM | POA: Insufficient documentation

## 2015-02-02 DIAGNOSIS — I10 Essential (primary) hypertension: Secondary | ICD-10-CM | POA: Insufficient documentation

## 2015-02-02 DIAGNOSIS — I429 Cardiomyopathy, unspecified: Secondary | ICD-10-CM | POA: Diagnosis not present

## 2015-02-02 DIAGNOSIS — Z95810 Presence of automatic (implantable) cardiac defibrillator: Secondary | ICD-10-CM | POA: Insufficient documentation

## 2015-02-02 DIAGNOSIS — Z79899 Other long term (current) drug therapy: Secondary | ICD-10-CM | POA: Insufficient documentation

## 2015-02-02 DIAGNOSIS — I4729 Other ventricular tachycardia: Secondary | ICD-10-CM

## 2015-02-02 DIAGNOSIS — I472 Ventricular tachycardia: Secondary | ICD-10-CM | POA: Insufficient documentation

## 2015-02-02 DIAGNOSIS — I5023 Acute on chronic systolic (congestive) heart failure: Secondary | ICD-10-CM | POA: Diagnosis not present

## 2015-02-02 DIAGNOSIS — I5022 Chronic systolic (congestive) heart failure: Secondary | ICD-10-CM | POA: Insufficient documentation

## 2015-02-02 DIAGNOSIS — E119 Type 2 diabetes mellitus without complications: Secondary | ICD-10-CM | POA: Diagnosis not present

## 2015-02-02 DIAGNOSIS — I428 Other cardiomyopathies: Secondary | ICD-10-CM

## 2015-02-02 MED ORDER — TORSEMIDE 100 MG PO TABS: 100.0000 mg | ORAL_TABLET | Freq: Two times a day (BID) | ORAL | Status: DC

## 2015-02-02 MED ORDER — POTASSIUM CHLORIDE CRYS ER 20 MEQ PO TBCR: EXTENDED_RELEASE_TABLET | ORAL | Status: DC

## 2015-02-02 MED ORDER — LOSARTAN POTASSIUM 25 MG PO TABS: 12.5000 mg | ORAL_TABLET | Freq: Every evening | ORAL | Status: DC

## 2015-02-02 NOTE — Progress Notes (Signed)
Patient ID: [name], female   DOB: [birth year], 49 y.o.   MRN: [id number] EP: Dr [name] PCP: Dr [name]  HPI: [name] is a 49 yo female with a history of NICM s/p BiV ICD 2007 [facility] [employer]), chronic systolic HF, DM2, HTN, recurrent VT, morbid obesity and OSA on CPAP.   Admitted to [facility] 12/08/13 with progressive DOE.and volume overload.  She was transferred from [facility] to [facility] due to acute decompensated HF. ECHO EF 35% by echo but difficult study.  Admitted 08/05/14 after ICD shock for V fib. Also diuresed with IV lasix and transitioned back to torsemide. Also with thyromegaly and had ultrasound with small cysts noted. Discharge weight was 249 pounds.  She had another ICD shock in 12/15 for VT.   She is unable to tolerate Corlanor 5 mg bid but does ok on 2.5 mg bid.   She is now off losartan, metolazone, and Bidil due to "dizziness."  BP today is 114/72.  We checked orthostatics, she was no orthostatic today.  She says that she still gets "dizzy" spells, sometimes they seem fairly random, sometimes they occur with standing.  Not as bad as they were in the past.  No falls.  She is breathing better overall. She is still short of breath walking 75-100 feet.  No problems walking in her house.  Still with 3-pillow orthopnea.  Weight is down 5 lbs since last appointment.   RHC 01/06/15 RA mean 18 RV 66/22 PA 66/34, mean 48 PCWP mean 28 Oxygen saturations: PA 55% AO 93% Cardiac Output (Fick) 3.77  Cardiac Index (Fick) 1.8 PVR 5.3 WU  Cardiac Output (Thermo) 4.77 Cardiac Index (Thermo) 2.26 PVR 4.2 WU  [facility] 12/12/13  RA 38/37 (35)  RV 90/28  PA 95/51 (71)  PCWP 57/73 (54)  LV 127/50  AO 123/66 (100)  Oxygen saturations:  PA 40%  AO 96%  Cardiac Output/Index (Fick) 3.2/1.4, PVR 5.3  Normal coronary anatomy  CPX 6/15 FVC 1.34 (42%)  FEV1 1.11 (43%)  FEV1/FVC 83%  MVV 42 (40%) RPE: 19 Resting HR: 80 Peak HR: 115 (67% age predicted max HR) BP rest: 102/70 BP peak:  118/68 Peak VO2: 9.3 (52.9% predicted peak VO2) VE/VCO2 slope: 29.3 OUES: 1.57 Peak RER: 0.95 Ventilatory Threshold: 7.7 (43.8% predicted peak VO2) Peak RR 44 Peak Ventilation: 32 VE/MVV: 76.2% PETCO2 at peak: 41 O2pulse: 10 (77% predicted O2pulse)  - Echo 12/09/13 EF 35% echo but difficult study.  - Echo (3/16) with EF 30-35%, severe LV dilation with mild LVH, restrictive diastolic function, moderate MR, mildly decreased RV systolic function, PA systolic pressure 60 mmHg.   ABIs (4/16) were normal.   Labs 11/28/13 Pro BNP 948  Labs 12/23/13 Dig level 0.6, K 4.6,  Creatinine 0.86 , Pro BNP 248 Labs 02/23/14 K 3.2 Cr 0.69 Labs 8/15 K 3.7, creatinine 0.78 Labs 12/15 K 4.5, creatinine 0.98, BNP 172, TSH normal Labs 3/16 TSH normal, LFTs normal, digoxin 0.9 Labs 4/16 K 3.7, creatinine 1.06, BNP 235, HCT 42.8 Laba 12/21/2014: K 4.8 Creatine 1.33 Labs 01/20/2015: K 4.8 Creatinine 1.33  Labs 01/26/15: K 4.2, creatinine 1.21, BNP 224, HCT 41.7  FH: Father alive with HF   SH: No ETOH and does not smoke, lives in [place] with husband and 57 yr old daughter   ROS: All systems negative except as listed in HPI, PMH and Problem List.  Past Medical History  Diagnosis Date  . CHF (congestive heart failure)     a. EF 30-35%,  RV mildly dilated (difficult study 11/2013) b. RHC (12/2013): RA 38/37 (35), RV 90/28, PA 95/51 (71), PCWP 54, PA 40%, AO 96 %, CO/CI Fick 3.2/1.4, PVR 5.3   . Nonischemic cardiomyopathy 12/2013    a. LHC (12/2013) normal coronary anatomy   . Mitral regurgitation     moderate to severe  . LBBB (left bundle branch block)   . HTN (hypertension)   . Asthma   . IBS (irritable bowel syndrome)     with primarily constipation  . Morbid obesity   . GERD (gastroesophageal reflux disease)   . IDA (iron deficiency anemia)     2o TO SB AVMS, Hx parenteral iron Dr Mariel Sleet  . AVM (arteriovenous malformation)     Small bowel, s/p duble balloon enteroscopy/APC jejunum Dr Gwinda Passe  [facility]) 01/23/2011  . Peripheral neuropathy   . OSA (obstructive sleep apnea)     on CPAP qhs  . Diabetes mellitus without complication   . Torsades de pointes     a. appropriate ICD therapy 12/2014 in setting of hypokalemia    Current Outpatient Prescriptions  Medication Sig Dispense Refill  . albuterol (PROVENTIL HFA;VENTOLIN HFA) 108 (90 BASE) MCG/ACT inhaler Inhale 2 puffs into the lungs every 6 (six) hours as needed for wheezing or shortness of breath.    Marland Kitchen amiodarone (PACERONE) 200 MG tablet Take 0.5 tablets (100 mg total) by mouth daily. 60 tablet 3  . aspirin EC 81 MG EC tablet Take 1 tablet (81 mg total) by mouth daily. 30 tablet 3  . carvedilol (COREG) 6.25 MG tablet Take 0.5 tablets (3.125 mg total) by mouth 2 (two) times daily with a meal. 60 tablet 3  . cyclobenzaprine (FLEXERIL) 10 MG tablet Take 10 mg by mouth 3 (three) times daily as needed for muscle spasms.     . digoxin (LANOXIN) 0.125 MG tablet Take 0.5 tablets (0.0625 mg total) by mouth daily. 90 tablet 3  . ferrous sulfate 325 (65 FE) MG tablet Take 325 mg by mouth 3 (three) times daily.    Marland Kitchen gabapentin (NEURONTIN) 300 MG capsule Take 600 mg by mouth 4 (four) times daily.    . insulin aspart (NOVOLOG) 100 UNIT/ML injection Inject 0-20 Units into the skin 3 (three) times daily with meals. (Patient taking differently: Inject 0-20 Units into the skin 3 (three) times daily with meals. Per sliding scale) 1 vial 5  . insulin detemir (LEVEMIR) 100 UNIT/ML injection Inject 100 Units into the skin at bedtime.     . ivabradine (CORLANOR) 5 MG TABS tablet Take 0.5 tablets (2.5 mg total) by mouth 2 (two) times daily with a meal. 60 tablet 2  . magnesium oxide (MAG-OX) 400 (241.3 MG) MG tablet Take 1 tablet (400 mg total) by mouth daily. 30 tablet 6  . metolazone (ZAROXOLYN) 2.5 MG tablet Take 1 tablet (2.5 mg total) by mouth once a week. Take one tablet by mouth 30 minutes before taking morning dose of Torsemide on Tuesdays 10 tablet  3  . omeprazole (PRILOSEC) 40 MG capsule Take 1 capsule (40 mg total) by mouth daily. 30 capsule 6  . oxyCODONE-acetaminophen (PERCOCET) 10-325 MG per tablet Take 0.5-1 tablets by mouth every 4 (four) hours as needed for pain.     . potassium chloride SA (K-DUR,KLOR-CON) 20 MEQ tablet Take 80 MEQ (4 tablets) am and 60 MEQ (3 tablets) pm 210 tablet 3  . spironolactone (ALDACTONE) 25 MG tablet Take 1 tablet (25 mg total) by mouth 2 (two) times daily. 60  tablet 3  . temazepam (RESTORIL) 30 MG capsule Take 30 mg by mouth at bedtime.    . torsemide (DEMADEX) 100 MG tablet Take 1 tablet (100 mg total) by mouth 2 (two) times daily. 90 tablet 0  . vitamin E 400 UNIT capsule Take 400 Units by mouth daily.    Marland Kitchen allopurinol (ZYLOPRIM) 300 MG tablet Take 1 tablet (300 mg total) by mouth daily. 30 tablet 6  . losartan (COZAAR) 25 MG tablet Take 0.5 tablets (12.5 mg total) by mouth every evening. 15 tablet 3  . phenazopyridine (PYRIDIUM) 200 MG tablet Take 1 tablet (200 mg total) by mouth 3 (three) times daily. (Patient not taking: Reported on 02/02/2015) 6 tablet 0   No current facility-administered medications for this encounter.    Filed Vitals:   02/02/15 0900  BP: 114/72  Pulse: 70  Weight: 268 lb (121.564 kg)  SpO2: 97%     PHYSICAL EXAM: General: Obese woman. No resp difficulty. husband presenti. Arrived in a wheel chair. Marland Kitchen  HEENT: normal Neck: Thick. JVP 10-12 cm. Carotids 2+ bilaterally; no bruits. No lymphadenopathy or thryomegaly appreciated. Cor: PMI normal. Regular rate & rhythm. No rubs, 2/6 HSM LLSB.  Unable to palpate pedal pulses.  Lungs: clear Abdomen: obese, soft, nontender, mildly distended. No hepatosplenomegaly. No bruits or masses. Good bowel sounds. Extremities: no cyanosis, clubbing, rash.  1+ ankle edema bilaterally.  Neuro: alert & orientedx3, cranial nerves grossly intact. Moves all 4 extremities w/o difficulty. Affect pleasant.  ASSESSMENT & PLAN:  1. Chronic Systolic  Heart Failure: Nonischemic cardiomyopathy, s/p [employer] CRT-D. EF 30-35% (11/2014). CPX in 6/15 showed mild cardiac limitation but main issue seemed to be severe ventilatory limitation due to obesity and restrictive physiology.  NYHA class IIIb symptoms.  Weight is down but she is still volume overloaded on exam.  Still has occasional "dizziness" but this is improved, not orthostatic.    - Increase torsemide to 100 mg bid.  Continue KCl 80 qam/60 qpm.  - Restart losartan 12.5 mg daily, take in the evening.   - Continue current spironolactone and digoxin.  - Check BMET/digoxin level in 1 week.   - Continue Coreg 3.125 mg bid.  - Continue ivabradine 2.5 mg twice a day. She had difficulty with 5 mg twice a day.  - Will need to watch closely. May need another RHC if symptoms persist.  - Reinforced the need and importance of daily weights, a low sodium diet, and fluid restriction (less than 2 L a day). Instructed to call the HF clinic if weight increases more than 3 lbs overnight or 5 lbs in a week.  2. OSA: Using CPAP.  3. Morbid Obesity: She is going to consider bariatric surgery eventually.  Needs watch portions.  4. VT: Continue amiodarone 100 mg daily. Recent LFTs/TSH ok. Needs at least yearly eye exam.  5. Leg weakness: Difficult to palpate pedal pulses but ABIs normal in 4/16.  6. Dizziness: Better but still episodically present.  Not orthostatic.   Follow up in 2 weeks.    [name]  02/02/2015

## 2015-02-02 NOTE — Patient Instructions (Signed)
INCREASE Torsemide to 100 mg twice a day.  INCREASE Potassium to 80 MEQ(4 tablets) in the AM and 60 MEQ (3 tablets) in the PM.  START Losartan 12.5 mg every evening.  FOLLOW UP in 2 weeks.

## 2015-02-04 ENCOUNTER — Encounter: Payer: Self-pay | Admitting: Internal Medicine

## 2015-02-11 ENCOUNTER — Ambulatory Visit: Payer: Medicare Other | Admitting: *Deleted

## 2015-02-12 ENCOUNTER — Ambulatory Visit (INDEPENDENT_AMBULATORY_CARE_PROVIDER_SITE_OTHER): Payer: Medicare Other | Admitting: Gastroenterology

## 2015-02-12 ENCOUNTER — Encounter: Payer: Self-pay | Admitting: Gastroenterology

## 2015-02-12 VITALS — BP 85/50 | HR 78 | Temp 98.4°F | Ht 65.0 in | Wt 273.2 lb

## 2015-02-12 DIAGNOSIS — K5909 Other constipation: Secondary | ICD-10-CM

## 2015-02-12 MED ORDER — LINACLOTIDE 290 MCG PO CAPS: 290.0000 ug | ORAL_CAPSULE | Freq: Every day | ORAL | Status: AC

## 2015-02-12 NOTE — Progress Notes (Signed)
Primary Care Physician:  [name], MD Primary Gastroenterologist:  Dr. Darrick Penna   Chief Complaint  Patient presents with  . Abdominal Pain    HPI:   [name] is a 49 y.o. female presenting today at the request of Dr. [name] secondary to abdominal pain.  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 Has been treated with DBE and APC therapy at [facility] previously. She was referred back to [facility] for further evaluation. EGD Sept 2013 at [facility]: diminutive duodenal polyp, polyp in gastric cardia, path with benign duodenal mucosa with focally prominent Brunner's glands. Fundic gland polyps.    CT without contrast May 2016 without acute findings. Notes chronic constipation. States it is "constant". Associated nausea, pain in upper abdomen. Feels like constipation is worsening. Abdomen feels "hard" all the time. A few weeks ago felt like everything she ate caused cramping. Had a few days of just laying in the bed and feeling nauseated. Trying to eat soup, couldn't get any relief. Craves ARGO starch, dill pickles, and ice. Doesn't eat the starch. BM about every other day, Bristol scale #1-2, doesn't empty well. Takes stool softeners and Miralax currently.  Very low-volume hematochezia intermittently. Omeprazole for GERD, controls well. No dysphagia.    Past Medical History  Diagnosis Date  . CHF (congestive heart failure)     a. EF 30-35%, RV mildly dilated (difficult study 11/2013) b. RHC (12/2013): RA 38/37 (35), RV 90/28, PA 95/51 (71), PCWP 54, PA 40%, AO 96 %, CO/CI Fick 3.2/1.4, PVR 5.3   . Nonischemic cardiomyopathy 12/2013    a. LHC (12/2013) normal coronary anatomy   . Mitral regurgitation     moderate to severe  . LBBB (left bundle branch block)   . HTN (hypertension)   . Asthma   . IBS (irritable bowel syndrome)     with primarily  constipation  . Morbid obesity   . GERD (gastroesophageal reflux disease)   . IDA (iron deficiency anemia)     2o TO SB AVMS, Hx parenteral iron Dr Mariel Sleet  . AVM (arteriovenous malformation)     Small bowel, s/p duble balloon enteroscopy/APC jejunum Dr Gwinda Passe [facility]) 01/23/2011  . Peripheral neuropathy   . OSA (obstructive sleep apnea)     on CPAP qhs  . Diabetes mellitus without complication   . Torsades de pointes     a. appropriate ICD therapy 12/2014 in setting of hypokalemia    Past Surgical History  Procedure Laterality Date  . Appendectomy    . Cholecystectomy      biliary dyskinesia  . Mastectomy  2003    left, partial  . Knee arthroscopy  2005    left  . Small bowel enteroscopy  MAY 2012 [facility] DR. GILLIAM    SB AVMS s/p APC  . Colonoscopy  OCT 2010/SEP 2011    tortuous colon, 3 polyps-benign(2010),   . Upper gastrointestinal endoscopy  '08, '10, SEP 2011    mild antral gastritis (2010), negative SB bx (2010), incomlpete Schatzki's ring (2011)  . Small bowel enteroscopy  SEP 2011 [facility]    Fields-NO AVMS  . Givens capsule study  NOV 2010     NO AVMS, normal  . Irrigation and debridement abscess  06/25/2012    Procedure: MINOR INCISION AND DRAINAGE OF ABSCESS;  Surgeon: [name], MD;  Location: [facility] ORS;  Service: General;  Laterality: [name];  Incision & Drainage of Infected Sebaceous Cyst on Chest  . Breast surgery    . Implantable cardioverter defibrillator generator change Left 02/15/2012    BSX CRTD   . Left and right heart catheterization with coronary/graft angiogram  12/12/2013    Procedure: LEFT AND RIGHT HEART CATHETERIZATION WITH Isabel Caprice;  Surgeon: Peter M Swaziland, MD;  Location: [facility] CATH LAB;  Service: Cardiovascular;;  . Right heart catheterization [name] 01/06/2015    Procedure: RIGHT HEART CATH;  Surgeon: Laurey Morale, MD;  Location: [facility] CATH LAB;  Service: Cardiovascular;  Laterality: [name];  . Esophagogastroduodenoscopy  Sept 2013     [facility]: diminutive duodenal polyp, polyp in gastric cardia, path with benign duodenal mucosa with focally prominent Brunner's glands. Fundic gland polyps.     Current Outpatient Prescriptions  Medication Sig Dispense Refill  . albuterol (PROVENTIL HFA;VENTOLIN HFA) 108 (90 BASE) MCG/ACT inhaler Inhale 2 puffs into the lungs every 6 (six) hours as needed for wheezing or shortness of breath.    . allopurinol (ZYLOPRIM) 300 MG tablet Take 1 tablet (300 mg total) by mouth daily. 30 tablet 6  . amiodarone (PACERONE) 200 MG tablet Take 0.5 tablets (100 mg total) by mouth daily. 60 tablet 3  . aspirin EC 81 MG EC tablet Take 1 tablet (81 mg total) by mouth daily. 30 tablet 3  . carvedilol (COREG) 6.25 MG tablet Take 0.5 tablets (3.125 mg total) by mouth 2 (two) times daily with a meal. 60 tablet 3  . cyclobenzaprine (FLEXERIL) 10 MG tablet Take 10 mg by mouth 3 (three) times daily as needed for muscle spasms.     . digoxin (LANOXIN) 0.125 MG tablet Take 0.5 tablets (0.0625 mg total) by mouth daily. 90 tablet 3  . ferrous sulfate 325 (65 FE) MG tablet Take 325 mg by mouth 3 (three) times daily.    Marland Kitchen gabapentin (NEURONTIN) 300 MG capsule Take 600 mg by mouth 4 (four) times daily.    . insulin aspart (NOVOLOG) 100 UNIT/ML injection Inject 0-20 Units into the skin 3 (three) times daily with meals. (Patient taking differently: Inject 0-20 Units into the skin 3 (three) times daily with meals. Per sliding scale) 1 vial 5  . insulin detemir (LEVEMIR) 100 UNIT/ML injection Inject 100 Units into the skin at bedtime.     . ivabradine (CORLANOR) 5 MG TABS tablet Take 0.5 tablets (2.5 mg total) by mouth 2 (two) times daily with a meal. 60 tablet 2  . losartan (COZAAR) 25 MG tablet Take 0.5 tablets (12.5 mg total) by mouth every evening. 15 tablet 3  . magnesium oxide (MAG-OX) 400 (241.3 MG) MG tablet Take 1 tablet (400 mg total) by mouth daily. 30 tablet 6  . metolazone (ZAROXOLYN) 2.5 MG tablet Take 1 tablet  (2.5 mg total) by mouth once a week. Take one tablet by mouth 30 minutes before taking morning dose of Torsemide on Tuesdays 10 tablet 3  . omeprazole (PRILOSEC) 40 MG capsule Take 1 capsule (40 mg total) by mouth daily. 30 capsule 6  . oxyCODONE-acetaminophen (PERCOCET) 10-325 MG per tablet Take 0.5-1 tablets by mouth every 4 (four) hours as needed for pain.     . phenazopyridine (PYRIDIUM) 200 MG tablet Take 1 tablet (200 mg total) by mouth 3 (three) times daily. 6 tablet 0  . potassium chloride SA (K-DUR,KLOR-CON) 20 MEQ tablet Take 80 MEQ (4 tablets) am and 60 MEQ (3 tablets) pm 210 tablet 3  . spironolactone (ALDACTONE) 25 MG  tablet Take 1 tablet (25 mg total) by mouth 2 (two) times daily. 60 tablet 3  . temazepam (RESTORIL) 30 MG capsule Take 30 mg by mouth at bedtime.    . torsemide (DEMADEX) 100 MG tablet Take 1 tablet (100 mg total) by mouth 2 (two) times daily. 90 tablet 0  . vitamin E 400 UNIT capsule Take 400 Units by mouth daily.    . Linaclotide (LINZESS) 290 MCG CAPS capsule Take 1 capsule (290 mcg total) by mouth daily. 30 minutes before breakfast 30 capsule 5   No current facility-administered medications for this visit.    Allergies as of 02/12/2015 - Review Complete 02/12/2015  Allergen Reaction Noted  . Cymbalta [duloxetine hcl] Other (See Comments) 12/04/2013  . Trazodone and nefazodone Other (See Comments) 12/04/2013  . Lyrica [pregabalin] Swelling 12/04/2013    Family History  Problem Relation Age of Onset  . Colon cancer Paternal Uncle   . Cervical cancer Mother   . Hypertension Mother   . Cancer Mother   . Heart disease Father   . Hypertension Father   . GER disease Father   . Heart attack Father   . Bleeding Disorder Father   . Diabetes Sister   . Heart disease Sister   . Hypertension Sister     History   Social History  . Marital Status: Single    Spouse Name: [name]  . Number of Children: 1  . Years of Education: [name]   Occupational History  .  disabled    Social History Main Topics  . Smoking status: Former Smoker -- 0.50 packs/day for 20 years    Types: Cigarettes    Quit date: 11/19/2009  . Smokeless tobacco: Former Neurosurgeon  . Alcohol Use: No  . Drug Use: No  . Sexual Activity: Yes    Birth Control/ Protection: None   Other Topics Concern  . 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   Single- 1 daughter age 41.    Does not get regular exercise.      Review of Systems: Gen: Denies any fever, chills, fatigue, weight loss, lack of appetite.  CV: Denies chest pain, heart palpitations, peripheral edema, syncope.  Resp: +DOE GI: see HPI GU : Denies urinary burning, urinary frequency, urinary hesitancy MS: +joint pain Derm: Denies rash, itching, dry skin Psych: Denies depression, anxiety, memory loss, and confusion Heme: see HPI.  Physical Exam: BP 85/50 mmHg  Pulse 78  Temp(Src) 98.4 F (36.9 C) (Oral)  Ht  (1.651 m)  Wt 273 lb 3.2 oz (123.923 kg)  BMI 45.46 kg/m2  LMP 01/19/2014 General:   Alert and oriented. Pleasant and cooperative. Well-nourished and well-developed.  Head:  Normocephalic and atraumatic. Eyes:  Without icterus, sclera clear and conjunctiva pink.  Ears:  Normal auditory acuity. Nose:  No deformity, discharge,  or lesions. Mouth:  No deformity or lesions, oral mucosa pink.  Lungs:  Clear to auscultation bilaterally. No wheezes, rales, or rhonchi. No distress.  Heart:  S1, S2 present with questionable systolic murmur Abdomen:  +BS, soft, largely obese, non-tender and non-distended. Difficult to appreciate HSM. No guarding or rebound. No masses appreciated.  Rectal:  Deferred  Msk:  Symmetrical without gross deformities. Normal posture. Extremities:  Without  edema. Neurologic:  Alert and  oriented x4;  grossly normal neurologically. Skin:  Intact without significant lesions or rashes. Psych:  Alert and cooperative. Normal mood and affect.  Lab  Results  Component Value Date   WBC 5.7 01/26/2015   HGB 13.3 01/26/2015   HCT 41.7 01/26/2015   MCV 91.4 01/26/2015   PLT 275 01/26/2015   Lab Results  Component Value Date   ALT 40 01/20/2015   AST 33 01/20/2015   ALKPHOS 85 01/20/2015   BILITOT 0.4 01/20/2015   Lab Results  Component Value Date   CREATININE 1.21* 01/26/2015   BUN 14 01/26/2015   NA 138 01/26/2015   K 4.2 01/26/2015   CL 98* 01/26/2015   CO2 29 01/26/2015

## 2015-02-12 NOTE — Assessment & Plan Note (Signed)
49 year old female with chronic constipation in the setting of opioids, likely contributor to abdominal pain and nausea. Needs more aggressive bowel regimen, as she is currently not having productive stools or regular BMs with OTC agents. Start Linzess 290 mcg once daily. Samples provided. Close follow-up in 4-6 weeks. As of note, scant low-volume hematochezia noted in presence of straining. Last colonoscopy 2010/2011?by Dr. Darrick Penna. Would consider updated colonoscopy in further bleeding despite aggressive constipation regimen. Would likely need at [facility] due to torturous colon and multiple health issues.

## 2015-02-12 NOTE — Patient Instructions (Signed)
I believe your abdominal pain and nausea is secondary to chronic constipation. We need to be more aggressive with treating this.  Start taking Linzess 1 capsule each morning, 30 minutes before breakfast. You may have some diarrhea the first few days, but it should resolve. IF NOT, call me.   We will see you back in 4-6 weeks

## 2015-02-23 ENCOUNTER — Encounter (HOSPITAL_COMMUNITY): Payer: Medicare Other

## 2015-02-24 NOTE — Progress Notes (Signed)
cc'd to pcp 

## 2015-03-01 ENCOUNTER — Other Ambulatory Visit: Payer: Self-pay | Admitting: *Deleted

## 2015-03-02 NOTE — Patient Outreach (Signed)
[facility]) Care Management   03/02/2015  [name] 02-23-1966 [id number]  [name] is an 49 y.o. female  Subjective: [name] is a 49 yo lady who lives in [place] with her fiance and her daughter. She has a history of NICM s/p BiV ICD 6948, chronic systolic HF, DM2, HTN, recurrent VT, morbid obesity and OSA on CPAP. [name] was hospitalized in April for volume overload/decompensated heart failure. [name] endured multiple AICD discharges during this admission. She did have a few ED visits post hospitalization in April but has been followed in the [facility] since and has not been readmitted to the hospital.     Goals    . "I want to get my energy back."     Patient reports feeling much better and having better energy since getting volume under control.     Marland Kitchen "I want to stay out of the hospital!"        Objective:  BP 108/72 mmHg  Pulse 76  Wt 265 lb (120.203 kg)  SpO2 98%  LMP 01/19/2014  Review of Systems  Constitutional: Negative.   HENT: Negative.   Eyes: Negative.   Respiratory: Negative.   Cardiovascular: Negative.   Gastrointestinal: Negative.   Genitourinary: Negative.   Musculoskeletal: Positive for myalgias. Negative for falls.  Skin: Negative.   Neurological: Negative.   Endo/Heme/Allergies: Negative.   Psychiatric/Behavioral: Negative.     Physical Exam  Constitutional: She is oriented to person, place, and time. Vital signs are normal. She appears well-developed and well-nourished. She is active.  Cardiovascular: Normal rate, regular rhythm and normal heart sounds.   Respiratory: Effort normal and breath sounds normal.  GI: Soft. Bowel sounds are normal.  Neurological: She is alert and oriented to person, place, and time.  Skin: Skin is warm, dry and intact.  Psychiatric: She has a normal mood and affect. Her speech is normal and behavior is normal. Judgment and thought content normal. Cognition and  memory are normal.    Current Medications:   Current Outpatient Prescriptions  Medication Sig Dispense Refill  . albuterol (PROVENTIL HFA;VENTOLIN HFA) 108 (90 BASE) MCG/ACT inhaler Inhale 2 puffs into the lungs every 6 (six) hours as needed for wheezing or shortness of breath.    . allopurinol (ZYLOPRIM) 300 MG tablet Take 1 tablet (300 mg total) by mouth daily. 30 tablet 6  . amiodarone (PACERONE) 200 MG tablet Take 0.5 tablets (100 mg total) by mouth daily. 60 tablet 3  . aspirin EC 81 MG EC tablet Take 1 tablet (81 mg total) by mouth daily. 30 tablet 3  . carvedilol (COREG) 6.25 MG tablet Take 0.5 tablets (3.125 mg total) by mouth 2 (two) times daily with a meal. 60 tablet 3  . cyclobenzaprine (FLEXERIL) 10 MG tablet Take 10 mg by mouth 3 (three) times daily as needed for muscle spasms.     . digoxin (LANOXIN) 0.125 MG tablet Take 0.5 tablets (0.0625 mg total) by mouth daily. 90 tablet 3  . ferrous sulfate 325 (65 FE) MG tablet Take 325 mg by mouth 3 (three) times daily.    Marland Kitchen gabapentin (NEURONTIN) 300 MG capsule Take 600 mg by mouth 4 (four) times daily.    . insulin detemir (LEVEMIR) 100 UNIT/ML injection Inject 100 Units into the skin at bedtime.     . ivabradine (CORLANOR) 5 MG TABS tablet Take 0.5 tablets (2.5 mg total) by mouth 2 (two) times daily with a meal. 60 tablet  2  . Linaclotide (LINZESS) 290 MCG CAPS capsule Take 1 capsule (290 mcg total) by mouth daily. 30 minutes before breakfast 30 capsule 5  . losartan (COZAAR) 25 MG tablet Take 0.5 tablets (12.5 mg total) by mouth every evening. 15 tablet 3  . magnesium oxide (MAG-OX) 400 (241.3 MG) MG tablet Take 1 tablet (400 mg total) by mouth daily. 30 tablet 6  . omeprazole (PRILOSEC) 40 MG capsule Take 1 capsule (40 mg total) by mouth daily. 30 capsule 6  . oxyCODONE-acetaminophen (PERCOCET) 10-325 MG per tablet Take 0.5-1 tablets by mouth every 4 (four) hours as needed for pain.     . potassium chloride SA (K-DUR,KLOR-CON) 20 MEQ  tablet Take 80 MEQ (4 tablets) am and 60 MEQ (3 tablets) pm 210 tablet 3  . spironolactone (ALDACTONE) 25 MG tablet Take 1 tablet (25 mg total) by mouth 2 (two) times daily. 60 tablet 3  . temazepam (RESTORIL) 30 MG capsule Take 30 mg by mouth at bedtime.    . torsemide (DEMADEX) 100 MG tablet Take 1 tablet (100 mg total) by mouth 2 (two) times daily. 90 tablet 0  . vitamin E 400 UNIT capsule Take 400 Units by mouth daily.    . insulin aspart (NOVOLOG) 100 UNIT/ML injection Inject 0-20 Units into the skin 3 (three) times daily with meals. (Patient taking differently: Inject 0-20 Units into the skin 3 (three) times daily with meals. Per sliding scale) 1 vial 5  . metolazone (ZAROXOLYN) 2.5 MG tablet Take 1 tablet (2.5 mg total) by mouth once a week. Take one tablet by mouth 30 minutes before taking morning dose of Torsemide on Tuesdays (Patient not taking: Reported on 03/01/2015) 10 tablet 3  . phenazopyridine (PYRIDIUM) 200 MG tablet Take 1 tablet (200 mg total) by mouth 3 (three) times daily. (Patient not taking: Reported on 03/01/2015) 6 tablet 0   No current facility-administered medications for this visit.    Functional Status:   In your present state of health, do you have any difficulty performing the following activities: 01/29/2015 01/05/2015  Hearing? N -  Vision? N -  Difficulty concentrating or making decisions? N -  Walking or climbing stairs? Y -  Dressing or bathing? Y -  Doing errands, shopping? [name]  Preparing Food and eating ? N -  Using the Toilet? N -  In the past six months, have you accidently leaked urine? Y -  Do you have problems with loss of bowel control? N -  Managing your Medications? N -  Managing your Finances? N -  Housekeeping or managing your Housekeeping? Y -    Fall/Depression Screening:    PHQ 2/9 Scores 03/01/2015 01/29/2015 02/27/2014  PHQ - 2 Score 0 2 0  PHQ- 9 Score - 6 -    Assessment:    Chronic Health Condition (CHF) - profoundly improved  symptomatically over the last month; Ms [name] says she feels better this week than she has in 3 months; she is carrying on a conversation without any difficulty with breathing; [name] weight is down 3 more pounds since last month; she is weighing daily; she can verbalize signs and symptoms of CHF exacerbation, heart attack, and stroke; [name] is attending scheduled provider appointments; she knows which medications are for CHF and can repeat her medications and doses without looking at her list or medication bottles  Reinforced the need and importance of daily weights, a low sodium diet, and fluid restriction (less than 2 L a  day). Instructed to call the HF clinic if weight increases more than 3 lbs overnight or 5 lbs in a week.  Resolved acute condition (mild depression) - last month Ms. Wieting had a positive PHQ2 but with improvement in her physical symptoms, her emotional symptoms are also improved; she says she has a good outlook and is happy about feeling well enough to attend her daughter's swim team awards ceremony  [place] (DM) - Ms [name] is checking cbg's QID and recording; her cbg averages are as outlined below and much lower than last month; she attributes improved cbg's to better cardiovascular health and lower related stress; she says she feels better and can concentrate on dietary management; she is unable to exercise due to limitations related to habitus and cardiac condition but says she is making efforts to walk about in the house for exercise; [name] did relate that she has had cbg lows in the low 90's and she doesn't feel well (weak and somewhat lightheaded) when her cbg is this low; she also reports awakening in the night from time to time feeling weak and as if her cbg might be low and will have a snack of peanut butter crackers but she is not checking cbg's when she awakens; I advised [name] to check cbg is she awakens and has symptoms of hypoglycemia;  I also advised [name] to call her provider should she consistently see cbg's in the low 90's or below and/or become symptomatic  CBG Averages:  7 day = 127 14 day =  123 30 day =  130   cbg today = 129 (fasting)  Plan:   [name] will continue daily weights, calling for weight gain of 3# overnight or slowly increasing weight gain over the course of a few days.  [name] will check cbg's QID as prescribed and call provider for low's (90 or below) or symptoms of hypoglycemia.  [name] will check cbg if she awakens during the night with symptoms of hypoglycemia.   I will see [name] at home next month for routine home visit.   [facility] CM Care Plan Problem One        Patient Outreach from 03/01/2015 in [place] Problem One  Acute/Chronic Health Condition (CHF) with 4 hospital admissions in last 4 months   Care Plan for Problem One  Not Active   [facility] Long Term Goal (31-90 days)  patient will not be hospitalized for CHF exacerbation in the next 31 days   [facility] Long Term Goal Start Date  01/19/15   [facility] Long Term Goal Met Date  03/02/15   Interventions for Problem One Long Term Goal  collaborated with patient on care plan and long term goal   [facility] CM Short Term Goal #1 (0-30 days)  patient will attend all scheduled provider appointments in the next 30 days as evidenced by provider documentation and patient report   [facility] CM Short Term Goal #1 Start Date  01/19/15   [facility] CM Short Term Goal #1 Met Date  03/01/15   Interventions for Short Term Goal #1  utilizing teachback method, reviewed appointment schedule with patient and transportation arrangements   [facility] CM Short Term Goal #2 (0-30 days)  patient will verbalize signs and symptoms of worsening CHF symptoms and knowledge and who and when to call for help as evidenced by patient report of same to [facility] in the next 30 days   [facility] CM  Short Term Goal #2 Start Date  01/19/15   [facility] CM Short Term Goal #2 Met Date  03/01/15    Interventions for Short Term Goal #2  utilizing teachback method and stop light tool, reviewed daily assessment guidelines, signs and symptoms of worsening chf, and who/when to call for help    [facility] CM Care Plan Problem Two        Patient Outreach from 03/01/2015 in [place] Problem Two  Diabetes Management Concerns   Care Plan for Problem Two  Active   Interventions for Problem Two Long Term Goal   utilizing teachback method, reviewed signs and symptoms of hypoglycemia,  reviewed diabetes medications,  explained to patient necessity of reporting symptoms associated with lower cbg's   [facility] Long Term Goal (31-90) days  patient will not have hospitalization over the next 31 days for hypoglycemia   [facility] Long Term Goal Start Date  02/2015   [facility] CM Short Term Goal #1 (0-30 days)  patient will record cbg's and notify provider of non fasting cbg's <90 and/or symptoms of hypglycemia   [facility] CM Short Term Goal #1 Start Date  03/01/15   Interventions for Short Term Goal #2   utilizing teachback method, reviewed necessity of monitoring cbg's and recording daily   [facility] CM Short Term Goal #2 (0-30 days)  over the next 30 days patient will verbalize understanding of signs and symptoms of hypoglycemia and when to call provider   [name] CM Short Term Goal #2 Start Date  03/01/15   Interventions for Short Term Goal #2  utilizing teachback method, reviewed signs and symptoms of hypoglycemia and when to call provider,  prescribed Emmi educational materials related to diabetes care and hypoglycemia

## 2015-03-04 ENCOUNTER — Other Ambulatory Visit (HOSPITAL_COMMUNITY): Payer: Self-pay | Admitting: Adult Health

## 2015-03-11 ENCOUNTER — Encounter (HOSPITAL_COMMUNITY): Payer: Medicare Other

## 2015-03-12 ENCOUNTER — Ambulatory Visit (INDEPENDENT_AMBULATORY_CARE_PROVIDER_SITE_OTHER): Payer: Medicare Other | Admitting: Gastroenterology

## 2015-03-12 ENCOUNTER — Encounter: Payer: Self-pay | Admitting: Gastroenterology

## 2015-03-12 VITALS — BP 114/85 | HR 77 | Temp 97.7°F | Ht 65.0 in | Wt 275.0 lb

## 2015-03-12 DIAGNOSIS — K5909 Other constipation: Secondary | ICD-10-CM

## 2015-03-12 NOTE — Progress Notes (Signed)
Referring Provider: Gareth Morgan, MD Primary Care Physician:  [name], MD  Primary GI: Dr. Darrick Penna   Chief Complaint  Patient presents with  . Follow-up    doing alot better    HPI:   [name] is a 49 y.o. female presenting today with a history of chronic constipation, here for short-term follow-up after starting Linzess 290 mcg once daily. Scant low-volume hematochezia noted with straining at last appt, with last colonoscopy 2010/2011? By Dr. Darrick Penna. 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 Has been treated with DBE and APC therapy at [facility] previously. She was referred back to [facility] for further evaluation. EGD Sept 2013 at [facility]: diminutive duodenal polyp, polyp in gastric cardia, path with benign duodenal mucosa with focally prominent Brunner's glands. Fundic gland polyps. CT without contrast May 2016 without acute findings.   Taking Linzess daily to every other day. Much improved. No abdominal pain. Feels like stools are productive now. No rectal bleeding. No N/V.   Past Medical History  Diagnosis Date  . CHF (congestive heart failure)     a. EF 30-35%, RV mildly dilated (difficult study 11/2013) b. RHC (12/2013): RA 38/37 (35), RV 90/28, PA 95/51 (71), PCWP 54, PA 40%, AO 96 %, CO/CI Fick 3.2/1.4, PVR 5.3   . Nonischemic cardiomyopathy 12/2013    a. LHC (12/2013) normal coronary anatomy   . Mitral regurgitation     moderate to severe  . LBBB (left bundle branch block)   . HTN (hypertension)   . Asthma   . IBS (irritable bowel syndrome)     with primarily constipation  . Morbid obesity   . GERD (gastroesophageal reflux disease)   . IDA (iron deficiency anemia)     2o TO SB AVMS, Hx parenteral iron Dr Mariel Sleet  . AVM (arteriovenous malformation)     Small bowel, s/p duble balloon enteroscopy/APC jejunum Dr Gwinda Passe [facility]) 01/23/2011  .  Peripheral neuropathy   . OSA (obstructive sleep apnea)     on CPAP qhs  . Diabetes mellitus without complication   . Torsades de pointes     a. appropriate ICD therapy 12/2014 in setting of hypokalemia    Past Surgical History  Procedure Laterality Date  . Appendectomy    . Cholecystectomy      biliary dyskinesia  . Mastectomy  2003    left, partial  . Knee arthroscopy  2005    left  . Small bowel enteroscopy  MAY 2012 DBE [facility] DR. GILLIAM    SB AVMS s/p APC  . Colonoscopy  OCT 2010/SEP 2011    tortuous colon, 3 polyps-benign(2010),   . Upper gastrointestinal endoscopy  '08, '10, SEP 2011    mild antral gastritis (2010), negative SB bx (2010), incomlpete Schatzki's ring (2011)  . Small bowel enteroscopy  SEP 2011 [facility]    Fields-NO AVMS  . Givens capsule study  NOV 2010     NO AVMS, normal  . Irrigation and debridement abscess  06/25/2012    Procedure: MINOR INCISION AND DRAINAGE OF ABSCESS;  Surgeon: [name], MD;  Location: [facility] ORS;  Service: General;  Laterality: [name];  Incision & Drainage of Infected Sebaceous Cyst on Chest  . Breast surgery    . Implantable cardioverter defibrillator generator change Left 02/15/2012    BSX CRTD   . Left and right heart catheterization with coronary/graft angiogram  12/12/2013    Procedure: LEFT AND RIGHT HEART CATHETERIZATION WITH Isabel Caprice;  Surgeon: Peter M Swaziland, MD;  Location: [facility] CATH LAB;  Service: Cardiovascular;;  . Right heart catheterization [name] 01/06/2015    Procedure: RIGHT HEART CATH;  Surgeon: Laurey Morale, MD;  Location: [facility] CATH LAB;  Service: Cardiovascular;  Laterality: [name];  . Esophagogastroduodenoscopy  Sept 2013    [facility]: diminutive duodenal polyp, polyp in gastric cardia, path with benign duodenal mucosa with focally prominent Brunner's glands. Fundic gland polyps.     Current Outpatient Prescriptions  Medication Sig Dispense Refill  . albuterol (PROVENTIL HFA;VENTOLIN HFA) 108 (90 BASE)  MCG/ACT inhaler Inhale 2 puffs into the lungs every 6 (six) hours as needed for wheezing or shortness of breath.    . allopurinol (ZYLOPRIM) 300 MG tablet Take 1 tablet (300 mg total) by mouth daily. 30 tablet 6  . allopurinol (ZYLOPRIM) 300 MG tablet TAKE 1 TABLET BY MOUTH ONCE DAILY. 30 tablet 0  . amiodarone (PACERONE) 200 MG tablet Take 0.5 tablets (100 mg total) by mouth daily. 60 tablet 3  . aspirin EC 81 MG EC tablet Take 1 tablet (81 mg total) by mouth daily. 30 tablet 3  . carvedilol (COREG) 6.25 MG tablet Take 0.5 tablets (3.125 mg total) by mouth 2 (two) times daily with a meal. 60 tablet 3  . cyclobenzaprine (FLEXERIL) 10 MG tablet Take 10 mg by mouth 3 (three) times daily as needed for muscle spasms.     . digoxin (LANOXIN) 0.125 MG tablet Take 0.5 tablets (0.0625 mg total) by mouth daily. 90 tablet 3  . ferrous sulfate 325 (65 FE) MG tablet Take 325 mg by mouth 3 (three) times daily.    Marland Kitchen gabapentin (NEURONTIN) 300 MG capsule Take 600 mg by mouth 4 (four) times daily.    . insulin aspart (NOVOLOG) 100 UNIT/ML injection Inject 0-20 Units into the skin 3 (three) times daily with meals. (Patient taking differently: Inject 0-20 Units into the skin 3 (three) times daily with meals. Per sliding scale) 1 vial 5  . insulin detemir (LEVEMIR) 100 UNIT/ML injection Inject 100 Units into the skin at bedtime.     . ivabradine (CORLANOR) 5 MG TABS tablet Take 0.5 tablets (2.5 mg total) by mouth 2 (two) times daily with a meal. 60 tablet 2  . Linaclotide (LINZESS) 290 MCG CAPS capsule Take 1 capsule (290 mcg total) by mouth daily. 30 minutes before breakfast 30 capsule 5  . losartan (COZAAR) 25 MG tablet Take 0.5 tablets (12.5 mg total) by mouth every evening. 15 tablet 3  . magnesium oxide (MAG-OX) 400 (241.3 MG) MG tablet Take 1 tablet (400 mg total) by mouth daily. 30 tablet 6  . omeprazole (PRILOSEC) 40 MG capsule Take 1 capsule (40 mg total) by mouth daily. 30 capsule 6  .  oxyCODONE-acetaminophen (PERCOCET) 10-325 MG per tablet Take 0.5-1 tablets by mouth every 4 (four) hours as needed for pain.     . potassium chloride SA (K-DUR,KLOR-CON) 20 MEQ tablet Take 80 MEQ (4 tablets) am and 60 MEQ (3 tablets) pm 210 tablet 3  . spironolactone (ALDACTONE) 25 MG tablet Take 1 tablet (25 mg total) by mouth 2 (two) times daily. 60 tablet 3  . temazepam (RESTORIL) 30 MG capsule Take 30 mg by mouth at bedtime.    . torsemide (DEMADEX) 100 MG tablet Take 1 tablet (100 mg total) by mouth 2 (two) times daily. 90 tablet 0  . vitamin E 400 UNIT capsule  Take 400 Units by mouth daily.    . metolazone (ZAROXOLYN) 2.5 MG tablet Take 1 tablet (2.5 mg total) by mouth once a week. Take one tablet by mouth 30 minutes before taking morning dose of Torsemide on Tuesdays (Patient not taking: Reported on 03/01/2015) 10 tablet 3  . phenazopyridine (PYRIDIUM) 200 MG tablet Take 1 tablet (200 mg total) by mouth 3 (three) times daily. (Patient not taking: Reported on 03/01/2015) 6 tablet 0   No current facility-administered medications for this visit.    Allergies as of 03/12/2015 - Review Complete 03/12/2015  Allergen Reaction Noted  . Cymbalta [duloxetine hcl] Other (See Comments) 12/04/2013  . Trazodone and nefazodone Other (See Comments) 12/04/2013  . Lyrica [pregabalin] Swelling 12/04/2013    Family History  Problem Relation Age of Onset  . Colon cancer Paternal Uncle   . Cervical cancer Mother   . Hypertension Mother   . Cancer Mother   . Heart disease Father   . Hypertension Father   . GER disease Father   . Heart attack Father   . Bleeding Disorder Father   . Diabetes Sister   . Heart disease Sister   . Hypertension Sister     History   Social History  . Marital Status: Single    Spouse Name: [name]  . Number of Children: 1  . Years of Education: [name]   Occupational History  . disabled    Social History Main Topics  . Smoking status: Former Smoker -- 0.50 packs/day for  20 years    Types: Cigarettes    Quit date: 11/19/2009  . Smokeless tobacco: Former Neurosurgeon     Comment: Quit x 4 years  . Alcohol Use: No  . Drug Use: No  . Sexual Activity: Yes    Birth Control/ Protection: None   Other Topics Concern  . None   Social History Narrative   Disability for heart disease. Was a CNA.   Single- 1 daughter age 65.    Does not get regular exercise.      Review of Systems: Negative unless mentioned in HPI.   Physical Exam: BP 114/85 mmHg  Pulse 77  Temp(Src) 97.7 F (36.5 C) (Oral)  Ht  (1.651 m)  Wt 275 lb (124.739 kg)  BMI 45.76 kg/m2  LMP 01/19/2014 General:   Alert and oriented. No distress noted. Pleasant and cooperative.  Head:  Normocephalic and atraumatic. Eyes:  Conjuctiva clear without scleral icterus. Abdomen:  +BS, soft, non-tender and non-distended. No rebound or guarding. No HSM or masses noted. Msk:  Symmetrical without gross deformities. Normal posture. Extremities:  Without edema. Neurologic:  Alert and  oriented x4;  grossly normal neurologically. Psych:  Alert and cooperative. Normal mood and affect.  Lab Results  Component Value Date   WBC 5.7 01/26/2015   HGB 13.3 01/26/2015   HCT 41.7 01/26/2015   MCV 91.4 01/26/2015   PLT 275 01/26/2015

## 2015-03-12 NOTE — Patient Instructions (Signed)
Continue Linzess 1 capsule each morning, 30 minutes before breakfast.   Please call me if you have any recurrent constipation OR rectal bleeding.   Return in 3 months

## 2015-03-19 ENCOUNTER — Other Ambulatory Visit (HOSPITAL_COMMUNITY): Payer: Self-pay | Admitting: Internal Medicine

## 2015-03-19 NOTE — Assessment & Plan Note (Signed)
49 year old female presenting with chronic constipation and much improved with Linzess 290 mcg daily. Scant hematochezia historically in presence of straining, now resolved. Last colonoscopy approximately 5 years ago. If further bleeding noted, would refer to [facility] for updated colonoscopy due to history of torturous colon and multiple health issues. For now, continue Linzess and return in 3 months.

## 2015-03-22 ENCOUNTER — Encounter: Payer: Self-pay | Admitting: Internal Medicine

## 2015-03-22 ENCOUNTER — Ambulatory Visit (INDEPENDENT_AMBULATORY_CARE_PROVIDER_SITE_OTHER): Payer: Medicare Other | Admitting: *Deleted

## 2015-03-22 DIAGNOSIS — I428 Other cardiomyopathies: Secondary | ICD-10-CM

## 2015-03-22 DIAGNOSIS — I429 Cardiomyopathy, unspecified: Secondary | ICD-10-CM | POA: Diagnosis not present

## 2015-03-23 NOTE — Progress Notes (Signed)
Remote ICD transmission.   

## 2015-03-25 LAB — CUP PACEART REMOTE DEVICE CHECK
Battery Remaining Longevity: 48 mo
Brady Statistic RA Percent Paced: 31 %
Date Time Interrogation Session: 20160711042600
HighPow Impedance: 51 Ohm
Lead Channel Pacing Threshold Amplitude: 1.4 V
Lead Channel Pacing Threshold Pulse Width: 0.8 ms
Lead Channel Sensing Intrinsic Amplitude: 2.1 mV
Lead Channel Sensing Intrinsic Amplitude: 8.8 mV
Lead Channel Setting Pacing Amplitude: 2 V
Lead Channel Setting Pacing Amplitude: 2.2 V
Lead Channel Setting Pacing Amplitude: 2.5 V
Lead Channel Setting Pacing Pulse Width: 0.8 ms
Lead Channel Setting Sensing Sensitivity: 0.5 mV
Lead Channel Setting Sensing Sensitivity: 1 mV
MDC IDC MSMT BATTERY REMAINING PERCENTAGE: 75 %
MDC IDC MSMT LEADCHNL LV IMPEDANCE VALUE: 1093 Ohm
MDC IDC MSMT LEADCHNL RA IMPEDANCE VALUE: 478 Ohm
MDC IDC MSMT LEADCHNL RV IMPEDANCE VALUE: 463 Ohm
MDC IDC PG SERIAL: 108094
MDC IDC SET LEADCHNL RV PACING PULSEWIDTH: 0.3 ms
MDC IDC STAT BRADY RV PERCENT PACED: 99 %
Zone Setting Detection Interval: 250 ms
Zone Setting Detection Interval: 300 ms
Zone Setting Detection Interval: 343 ms

## 2015-03-26 ENCOUNTER — Emergency Department (HOSPITAL_COMMUNITY): Payer: Medicare Other

## 2015-03-26 ENCOUNTER — Encounter (HOSPITAL_COMMUNITY): Payer: Self-pay | Admitting: Emergency Medicine

## 2015-03-26 ENCOUNTER — Inpatient Hospital Stay (HOSPITAL_COMMUNITY)
Admission: EM | Admit: 2015-03-26 | Discharge: 2015-04-08 | DRG: 286 | Disposition: A | Discharge status: Home Health | Payer: Medicare Other | Attending: Internal Medicine | Admitting: Internal Medicine

## 2015-03-26 ENCOUNTER — Other Ambulatory Visit: Payer: Self-pay | Admitting: *Deleted

## 2015-03-26 ENCOUNTER — Telehealth (HOSPITAL_COMMUNITY): Payer: Self-pay | Admitting: *Deleted

## 2015-03-26 DIAGNOSIS — Z79899 Other long term (current) drug therapy: Secondary | ICD-10-CM

## 2015-03-26 DIAGNOSIS — K210 Gastro-esophageal reflux disease with esophagitis: Secondary | ICD-10-CM

## 2015-03-26 DIAGNOSIS — F419 Anxiety disorder, unspecified: Secondary | ICD-10-CM | POA: Diagnosis present

## 2015-03-26 DIAGNOSIS — J45909 Unspecified asthma, uncomplicated: Secondary | ICD-10-CM | POA: Diagnosis present

## 2015-03-26 DIAGNOSIS — Z9012 Acquired absence of left breast and nipple: Secondary | ICD-10-CM | POA: Diagnosis present

## 2015-03-26 DIAGNOSIS — W1830XA Fall on same level, unspecified, initial encounter: Secondary | ICD-10-CM | POA: Diagnosis present

## 2015-03-26 DIAGNOSIS — I5043 Acute on chronic combined systolic (congestive) and diastolic (congestive) heart failure: Principal | ICD-10-CM | POA: Diagnosis present

## 2015-03-26 DIAGNOSIS — I472 Ventricular tachycardia, unspecified: Secondary | ICD-10-CM | POA: Insufficient documentation

## 2015-03-26 DIAGNOSIS — E1142 Type 2 diabetes mellitus with diabetic polyneuropathy: Secondary | ICD-10-CM | POA: Diagnosis present

## 2015-03-26 DIAGNOSIS — Z95810 Presence of automatic (implantable) cardiac defibrillator: Secondary | ICD-10-CM

## 2015-03-26 DIAGNOSIS — N183 Chronic kidney disease, stage 3 (moderate): Secondary | ICD-10-CM | POA: Diagnosis present

## 2015-03-26 DIAGNOSIS — Z888 Allergy status to other drugs, medicaments and biological substances status: Secondary | ICD-10-CM

## 2015-03-26 DIAGNOSIS — I509 Heart failure, unspecified: Secondary | ICD-10-CM | POA: Insufficient documentation

## 2015-03-26 DIAGNOSIS — K589 Irritable bowel syndrome without diarrhea: Secondary | ICD-10-CM | POA: Diagnosis present

## 2015-03-26 DIAGNOSIS — R079 Chest pain, unspecified: Secondary | ICD-10-CM | POA: Diagnosis not present

## 2015-03-26 DIAGNOSIS — K219 Gastro-esophageal reflux disease without esophagitis: Secondary | ICD-10-CM | POA: Diagnosis present

## 2015-03-26 DIAGNOSIS — I272 Other secondary pulmonary hypertension: Secondary | ICD-10-CM | POA: Diagnosis present

## 2015-03-26 DIAGNOSIS — D509 Iron deficiency anemia, unspecified: Secondary | ICD-10-CM | POA: Diagnosis present

## 2015-03-26 DIAGNOSIS — E1122 Type 2 diabetes mellitus with diabetic chronic kidney disease: Secondary | ICD-10-CM | POA: Diagnosis present

## 2015-03-26 DIAGNOSIS — S00212A Abrasion of left eyelid and periocular area, initial encounter: Secondary | ICD-10-CM | POA: Diagnosis present

## 2015-03-26 DIAGNOSIS — I10 Essential (primary) hypertension: Secondary | ICD-10-CM | POA: Diagnosis not present

## 2015-03-26 DIAGNOSIS — I5022 Chronic systolic (congestive) heart failure: Secondary | ICD-10-CM | POA: Diagnosis not present

## 2015-03-26 DIAGNOSIS — R55 Syncope and collapse: Secondary | ICD-10-CM | POA: Diagnosis not present

## 2015-03-26 DIAGNOSIS — Z794 Long term (current) use of insulin: Secondary | ICD-10-CM

## 2015-03-26 DIAGNOSIS — I428 Other cardiomyopathies: Secondary | ICD-10-CM

## 2015-03-26 DIAGNOSIS — Z8249 Family history of ischemic heart disease and other diseases of the circulatory system: Secondary | ICD-10-CM

## 2015-03-26 DIAGNOSIS — I493 Ventricular premature depolarization: Secondary | ICD-10-CM | POA: Diagnosis present

## 2015-03-26 DIAGNOSIS — R570 Cardiogenic shock: Secondary | ICD-10-CM | POA: Insufficient documentation

## 2015-03-26 DIAGNOSIS — Z886 Allergy status to analgesic agent status: Secondary | ICD-10-CM

## 2015-03-26 DIAGNOSIS — Z87891 Personal history of nicotine dependence: Secondary | ICD-10-CM

## 2015-03-26 DIAGNOSIS — I429 Cardiomyopathy, unspecified: Secondary | ICD-10-CM

## 2015-03-26 DIAGNOSIS — R339 Retention of urine, unspecified: Secondary | ICD-10-CM | POA: Diagnosis not present

## 2015-03-26 DIAGNOSIS — G4733 Obstructive sleep apnea (adult) (pediatric): Secondary | ICD-10-CM | POA: Diagnosis present

## 2015-03-26 DIAGNOSIS — I129 Hypertensive chronic kidney disease with stage 1 through stage 4 chronic kidney disease, or unspecified chronic kidney disease: Secondary | ICD-10-CM | POA: Diagnosis present

## 2015-03-26 DIAGNOSIS — Z79891 Long term (current) use of opiate analgesic: Secondary | ICD-10-CM

## 2015-03-26 DIAGNOSIS — N179 Acute kidney failure, unspecified: Secondary | ICD-10-CM | POA: Diagnosis present

## 2015-03-26 DIAGNOSIS — I4901 Ventricular fibrillation: Secondary | ICD-10-CM | POA: Insufficient documentation

## 2015-03-26 DIAGNOSIS — E871 Hypo-osmolality and hyponatremia: Secondary | ICD-10-CM | POA: Diagnosis not present

## 2015-03-26 DIAGNOSIS — R7989 Other specified abnormal findings of blood chemistry: Secondary | ICD-10-CM | POA: Insufficient documentation

## 2015-03-26 DIAGNOSIS — Z6841 Body Mass Index (BMI) 40.0 and over, adult: Secondary | ICD-10-CM

## 2015-03-26 DIAGNOSIS — Z7982 Long term (current) use of aspirin: Secondary | ICD-10-CM

## 2015-03-26 DIAGNOSIS — Y92009 Unspecified place in unspecified non-institutional (private) residence as the place of occurrence of the external cause: Secondary | ICD-10-CM

## 2015-03-26 HISTORY — DX: Pneumonia, unspecified organism: J18.9

## 2015-03-26 LAB — CBC
HEMATOCRIT: 44.3 % (ref 36.0–46.0)
Hemoglobin: 14.4 g/dL (ref 12.0–15.0)
MCH: 30.5 pg (ref 26.0–34.0)
MCHC: 32.5 g/dL (ref 30.0–36.0)
MCV: 93.9 fL (ref 78.0–100.0)
PLATELETS: 231 10*3/uL (ref 150–400)
RBC: 4.72 MIL/uL (ref 3.87–5.11)
RDW: 16 % — ABNORMAL HIGH (ref 11.5–15.5)
WBC: 5.7 10*3/uL (ref 4.0–10.5)

## 2015-03-26 LAB — COMPREHENSIVE METABOLIC PANEL
ALBUMIN: 3.4 g/dL — AB (ref 3.5–5.0)
ALK PHOS: 102 U/L (ref 38–126)
ALT: 23 U/L (ref 14–54)
AST: 32 U/L (ref 15–41)
Anion gap: 11 (ref 5–15)
BUN: 14 mg/dL (ref 6–20)
CO2: 29 mmol/L (ref 22–32)
CREATININE: 1.2 mg/dL — AB (ref 0.44–1.00)
Calcium: 9 mg/dL (ref 8.9–10.3)
Chloride: 98 mmol/L — ABNORMAL LOW (ref 101–111)
GFR calc non Af Amer: 52 mL/min — ABNORMAL LOW (ref >60)
Glucose, Bld: 180 mg/dL — ABNORMAL HIGH (ref 65–99)
Potassium: 3.7 mmol/L (ref 3.5–5.1)
Sodium: 138 mmol/L (ref 135–145)
TOTAL PROTEIN: 7.9 g/dL (ref 6.5–8.1)
Total Bilirubin: 0.9 mg/dL (ref 0.3–1.2)

## 2015-03-26 LAB — I-STAT TROPONIN, ED
TROPONIN I, POC: 0 ng/mL (ref 0.00–0.08)
TROPONIN I, POC: 0 ng/mL (ref 0.00–0.08)

## 2015-03-26 LAB — PROTIME-INR
INR: 1.25 (ref 0.00–1.49)
PROTHROMBIN TIME: 15.9 s — AB (ref 11.6–15.2)

## 2015-03-26 LAB — MAGNESIUM: Magnesium: 1.8 mg/dL (ref 1.7–2.4)

## 2015-03-26 LAB — BRAIN NATRIURETIC PEPTIDE: B NATRIURETIC PEPTIDE 5: 549.5 pg/mL — AB (ref 0.0–100.0)

## 2015-03-26 LAB — APTT: aPTT: 33 seconds (ref 24–37)

## 2015-03-26 LAB — GLUCOSE, CAPILLARY: Glucose-Capillary: 160 mg/dL — ABNORMAL HIGH (ref 65–99)

## 2015-03-26 LAB — D-DIMER, QUANTITATIVE (NOT AT ARMC): D DIMER QUANT: 1.35 ug{FEU}/mL — AB (ref 0.00–0.48)

## 2015-03-26 LAB — TROPONIN I: Troponin I: <0.03 ng/mL

## 2015-03-26 MED ORDER — MAGNESIUM OXIDE 400 (241.3 MG) MG PO TABS
400.0000 mg | ORAL_TABLET | Freq: Every day | ORAL | Status: DC
  Administered 2015-03-27 – 2015-04-08 (×13): 400 mg via ORAL
  Filled 2015-03-26 (×13): qty 1

## 2015-03-26 MED ORDER — LINACLOTIDE 290 MCG PO CAPS
290.0000 ug | ORAL_CAPSULE | Freq: Every day | ORAL | Status: DC
  Administered 2015-03-27 – 2015-03-28 (×2): 290 ug via ORAL
  Administered 2015-03-29: 145 ug via ORAL
  Administered 2015-03-30 – 2015-04-08 (×10): 290 ug via ORAL
  Filled 2015-03-26 (×2): qty 1
  Filled 2015-03-26 (×2): qty 2
  Filled 2015-03-26: qty 1
  Filled 2015-03-26: qty 2
  Filled 2015-03-26: qty 1
  Filled 2015-03-26: qty 2
  Filled 2015-03-26 (×2): qty 1
  Filled 2015-03-26: qty 2
  Filled 2015-03-26 (×2): qty 1

## 2015-03-26 MED ORDER — INSULIN ASPART 100 UNIT/ML ~~LOC~~ SOLN
0.0000 [IU] | Freq: Every day | SUBCUTANEOUS | Status: DC
  Administered 2015-03-28: 2 [IU] via SUBCUTANEOUS

## 2015-03-26 MED ORDER — INSULIN ASPART 100 UNIT/ML ~~LOC~~ SOLN
0.0000 [IU] | Freq: Three times a day (TID) | SUBCUTANEOUS | Status: DC
  Administered 2015-03-27: 2 [IU] via SUBCUTANEOUS

## 2015-03-26 MED ORDER — ONDANSETRON HCL 4 MG/2ML IJ SOLN
4.0000 mg | Freq: Four times a day (QID) | INTRAMUSCULAR | Status: DC | PRN
  Administered 2015-03-27 – 2015-03-29 (×3): 4 mg via INTRAVENOUS
  Filled 2015-03-26 (×3): qty 2

## 2015-03-26 MED ORDER — AMIODARONE HCL 100 MG PO TABS
100.0000 mg | ORAL_TABLET | Freq: Every day | ORAL | Status: DC
  Administered 2015-03-27 – 2015-03-30 (×4): 100 mg via ORAL
  Filled 2015-03-26 (×4): qty 1

## 2015-03-26 MED ORDER — ACETAMINOPHEN 325 MG PO TABS: 650.0000 mg | ORAL_TABLET | ORAL | Status: DC | PRN

## 2015-03-26 MED ORDER — POTASSIUM CHLORIDE CRYS ER 20 MEQ PO TBCR
40.0000 meq | EXTENDED_RELEASE_TABLET | Freq: Two times a day (BID) | ORAL | Status: DC
  Administered 2015-03-26 – 2015-03-28 (×5): 40 meq via ORAL
  Administered 2015-03-29: 20 meq via ORAL
  Administered 2015-03-29 – 2015-03-31 (×4): 40 meq via ORAL
  Filled 2015-03-26 (×11): qty 2

## 2015-03-26 MED ORDER — FUROSEMIDE 10 MG/ML IJ SOLN
80.0000 mg | Freq: Two times a day (BID) | INTRAMUSCULAR | Status: DC
  Administered 2015-03-27 (×2): 80 mg via INTRAVENOUS
  Filled 2015-03-26 (×3): qty 8

## 2015-03-26 MED ORDER — DULOXETINE HCL 30 MG PO CPEP
30.0000 mg | ORAL_CAPSULE | Freq: Every day | ORAL | Status: DC
  Administered 2015-03-27 – 2015-04-08 (×13): 30 mg via ORAL
  Filled 2015-03-26 (×13): qty 1

## 2015-03-26 MED ORDER — LOSARTAN POTASSIUM 25 MG PO TABS
12.5000 mg | ORAL_TABLET | Freq: Every evening | ORAL | Status: DC
  Administered 2015-03-26 – 2015-04-05 (×10): 12.5 mg via ORAL
  Filled 2015-03-26 (×2): qty 0.5
  Filled 2015-03-26 (×2): qty 1
  Filled 2015-03-26 (×2): qty 0.5
  Filled 2015-03-26 (×2): qty 1
  Filled 2015-03-26 (×5): qty 0.5

## 2015-03-26 MED ORDER — DIGOXIN 0.0625 MG HALF TABLET
0.0625 mg | ORAL_TABLET | Freq: Every day | ORAL | Status: DC
  Administered 2015-03-27 – 2015-03-28 (×2): 0.0625 mg via ORAL
  Administered 2015-03-29: 0.125 mg via ORAL
  Administered 2015-03-30 – 2015-04-02 (×4): 0.0625 mg via ORAL
  Filled 2015-03-26 (×7): qty 1

## 2015-03-26 MED ORDER — METOLAZONE 5 MG PO TABS
2.5000 mg | ORAL_TABLET | Freq: Once | ORAL | Status: AC
  Administered 2015-03-26: 2.5 mg via ORAL
  Filled 2015-03-26: qty 1

## 2015-03-26 MED ORDER — ALLOPURINOL 300 MG PO TABS
300.0000 mg | ORAL_TABLET | Freq: Every day | ORAL | Status: DC
  Administered 2015-03-27 – 2015-04-08 (×13): 300 mg via ORAL
  Filled 2015-03-26 (×13): qty 1

## 2015-03-26 MED ORDER — IVABRADINE HCL 5 MG PO TABS
2.5000 mg | ORAL_TABLET | Freq: Two times a day (BID) | ORAL | Status: DC
  Administered 2015-03-27 – 2015-04-08 (×25): 2.5 mg via ORAL
  Filled 2015-03-26 (×31): qty 1

## 2015-03-26 MED ORDER — FUROSEMIDE 10 MG/ML IJ SOLN
100.0000 mg | Freq: Once | INTRAVENOUS | Status: AC
  Administered 2015-03-26: 100 mg via INTRAVENOUS
  Filled 2015-03-26: qty 10

## 2015-03-26 MED ORDER — ALBUTEROL SULFATE (2.5 MG/3ML) 0.083% IN NEBU: 2.0000 mL | INHALATION_SOLUTION | Freq: Four times a day (QID) | RESPIRATORY_TRACT | Status: DC | PRN

## 2015-03-26 MED ORDER — ASPIRIN EC 81 MG PO TBEC
81.0000 mg | DELAYED_RELEASE_TABLET | Freq: Every day | ORAL | Status: DC
  Administered 2015-03-27 – 2015-04-08 (×13): 81 mg via ORAL
  Filled 2015-03-26 (×14): qty 1

## 2015-03-26 MED ORDER — OXYCODONE-ACETAMINOPHEN 5-325 MG PO TABS
1.0000 | ORAL_TABLET | ORAL | Status: DC | PRN
  Administered 2015-03-27 – 2015-03-29 (×4): 1 via ORAL
  Administered 2015-03-30 (×2): 2 via ORAL
  Administered 2015-04-01 – 2015-04-03 (×2): 1 via ORAL
  Administered 2015-04-03: 2 via ORAL
  Administered 2015-04-04 – 2015-04-06 (×4): 1 via ORAL
  Administered 2015-04-07: 2 via ORAL
  Filled 2015-03-26 (×2): qty 1
  Filled 2015-03-26: qty 2
  Filled 2015-03-26 (×6): qty 1
  Filled 2015-03-26 (×2): qty 2
  Filled 2015-03-26 (×2): qty 1
  Filled 2015-03-26: qty 2

## 2015-03-26 MED ORDER — CYCLOBENZAPRINE HCL 10 MG PO TABS: 10.0000 mg | ORAL_TABLET | Freq: Three times a day (TID) | ORAL | Status: DC | PRN

## 2015-03-26 MED ORDER — CARVEDILOL 3.125 MG PO TABS
3.1250 mg | ORAL_TABLET | Freq: Two times a day (BID) | ORAL | Status: DC
  Administered 2015-03-27 – 2015-04-01 (×11): 3.125 mg via ORAL
  Filled 2015-03-26 (×13): qty 1

## 2015-03-26 MED ORDER — SODIUM CHLORIDE 0.9 % IJ SOLN: 3.0000 mL | INTRAMUSCULAR | Status: DC | PRN

## 2015-03-26 MED ORDER — SODIUM CHLORIDE 0.9 % IV SOLN: 250.0000 mL | INTRAVENOUS | Status: DC | PRN

## 2015-03-26 MED ORDER — SPIRONOLACTONE 25 MG PO TABS
25.0000 mg | ORAL_TABLET | Freq: Two times a day (BID) | ORAL | Status: DC
  Administered 2015-03-27 – 2015-04-08 (×24): 25 mg via ORAL
  Filled 2015-03-26 (×28): qty 1

## 2015-03-26 MED ORDER — PANTOPRAZOLE SODIUM 40 MG PO TBEC
40.0000 mg | DELAYED_RELEASE_TABLET | Freq: Every day | ORAL | Status: DC
  Administered 2015-03-27 – 2015-04-08 (×13): 40 mg via ORAL
  Filled 2015-03-26 (×13): qty 1

## 2015-03-26 MED ORDER — FERROUS SULFATE 325 (65 FE) MG PO TABS
325.0000 mg | ORAL_TABLET | Freq: Three times a day (TID) | ORAL | Status: DC
  Administered 2015-03-26 – 2015-04-08 (×37): 325 mg via ORAL
  Filled 2015-03-26 (×39): qty 1

## 2015-03-26 MED ORDER — TEMAZEPAM 15 MG PO CAPS
30.0000 mg | ORAL_CAPSULE | Freq: Every day | ORAL | Status: DC
  Administered 2015-03-26 – 2015-04-07 (×13): 30 mg via ORAL
  Filled 2015-03-26 (×13): qty 2

## 2015-03-26 MED ORDER — GABAPENTIN 300 MG PO CAPS
600.0000 mg | ORAL_CAPSULE | Freq: Four times a day (QID) | ORAL | Status: DC
  Administered 2015-03-26 – 2015-03-28 (×9): 600 mg via ORAL
  Administered 2015-03-29: 300 mg via ORAL
  Administered 2015-03-29 – 2015-04-08 (×39): 600 mg via ORAL
  Filled 2015-03-26 (×52): qty 2

## 2015-03-26 MED ORDER — SODIUM CHLORIDE 0.9 % IJ SOLN
3.0000 mL | Freq: Two times a day (BID) | INTRAMUSCULAR | Status: DC
  Administered 2015-03-26 – 2015-04-03 (×14): 3 mL via INTRAVENOUS

## 2015-03-26 MED ORDER — HEPARIN SODIUM (PORCINE) 5000 UNIT/ML IJ SOLN
5000.0000 [IU] | Freq: Three times a day (TID) | INTRAMUSCULAR | Status: DC
  Administered 2015-03-26 – 2015-04-08 (×37): 5000 [IU] via SUBCUTANEOUS
  Filled 2015-03-26 (×44): qty 1

## 2015-03-26 MED ORDER — INSULIN DETEMIR 100 UNIT/ML ~~LOC~~ SOLN
100.0000 [IU] | Freq: Every day | SUBCUTANEOUS | Status: DC
  Administered 2015-03-26: 100 [IU] via SUBCUTANEOUS
  Filled 2015-03-26 (×2): qty 1

## 2015-03-26 MED ORDER — VITAMIN E 180 MG (400 UNIT) PO CAPS
400.0000 [IU] | ORAL_CAPSULE | Freq: Every day | ORAL | Status: DC
  Administered 2015-03-27 – 2015-04-08 (×13): 400 [IU] via ORAL
  Filled 2015-03-26 (×14): qty 1

## 2015-03-26 NOTE — Patient Outreach (Signed)
[facility]) Care Management  03/26/2015  [name] [birth year] [id number]  Call received from [name] who reports that she has worsening shortness of breath, feelings of "skipped heart beats", and weight gain of 12-13 pounds (268 > 280.6 today) over the course of "about a week." [name] was not in distress. I spoke with [name] at the [facility] and provided the update outlined above. [name] stated she would call [name].   I will see [name] in the home next week on Tuesday 03/30/15 @ 11:30am for face to face visit.    Marja Kays MHA,BSN,RN,CCM [facility] Care Management  [phone]

## 2015-03-26 NOTE — ED Provider Notes (Signed)
CSN: [id number]     Arrival date & time 03/26/15  1557 History   First MD Initiated Contact with Patient 03/26/15 1600     Chief Complaint  Patient presents with  . Loss of Consciousness   HPI Patient presents to the emergency room with complaints of chest pressure as well as a syncopal episode She  has a history of congestive heart failure associated with a nonischemic cardiomyopathy. She has had several days of chest pressure that is constant.  She has also noticed a  12 lb weight gain. She was speaking to the nurse on the hotline when the patient acutely experienced a syncopal episode. Patient was in the bathroom sitting when she fell striking the left side of her head. Patient now denies any trouble with headache or neck pain but she continues to have chest pressure. Eyes any abdominal pain or back pain. Past Medical History  Diagnosis Date  . CHF (congestive heart failure)     a. EF 30-35%, RV mildly dilated (difficult study 11/2013) b. RHC (12/2013): RA 38/37 (35), RV 90/28, PA 95/51 (71), PCWP 54, PA 40%, AO 96 %, CO/CI Fick 3.2/1.4, PVR 5.3   . Nonischemic cardiomyopathy 12/2013    a. LHC (12/2013) normal coronary anatomy   . Mitral regurgitation     moderate to severe  . LBBB (left bundle branch block)   . HTN (hypertension)   . Asthma   . IBS (irritable bowel syndrome)     with primarily constipation  . Morbid obesity   . GERD (gastroesophageal reflux disease)   . IDA (iron deficiency anemia)     2o TO SB AVMS, Hx parenteral iron Dr Mariel Sleet  . AVM (arteriovenous malformation)     Small bowel, s/p duble balloon enteroscopy/APC jejunum Dr Gwinda Passe [facility]) 01/23/2011  . Peripheral neuropathy   . OSA (obstructive sleep apnea)     on CPAP qhs  . Diabetes mellitus without complication   . Torsades de pointes     a. appropriate ICD therapy 12/2014 in setting of hypokalemia   Past Surgical History  Procedure Laterality Date  . Appendectomy    . Cholecystectomy      biliary  dyskinesia  . Mastectomy  2003    left, partial  . Knee arthroscopy  2005    left  . Small bowel enteroscopy  MAY 2012 [facility] DR. GILLIAM    SB AVMS s/p APC  . Colonoscopy  OCT 2010/SEP 2011    tortuous colon, 3 polyps-benign(2010),   . Upper gastrointestinal endoscopy  '08, '10, SEP 2011    mild antral gastritis (2010), negative SB bx (2010), incomlpete Schatzki's ring (2011)  . Small bowel enteroscopy  SEP 2011 [facility]    Fields-NO AVMS  . Givens capsule study  NOV 2010     NO AVMS, normal  . Irrigation and debridement abscess  06/25/2012    Procedure: MINOR INCISION AND DRAINAGE OF ABSCESS;  Surgeon: [name], MD;  Location: [facility];  Service: General;  Laterality: [name];  Incision & Drainage of Infected Sebaceous Cyst on Chest  . Breast surgery    . Implantable cardioverter defibrillator generator change Left 02/15/2012    BSX CRTD   . Left and right heart catheterization with coronary/graft angiogram  12/12/2013    Procedure: LEFT AND RIGHT HEART CATHETERIZATION WITH Isabel Caprice;  Surgeon: Peter M Swaziland, MD;  Location: [facility] CATH LAB;  Service: Cardiovascular;;  . Right heart catheterization [name] 01/06/2015    Procedure: RIGHT  HEART CATH;  Surgeon: Laurey Morale, MD;  Location: [facility] CATH LAB;  Service: Cardiovascular;  Laterality: [name];  . Esophagogastroduodenoscopy  Sept 2013    [facility]: diminutive duodenal polyp, polyp in gastric cardia, path with benign duodenal mucosa with focally prominent Brunner's glands. Fundic gland polyps.    Family History  Problem Relation Age of Onset  . Colon cancer Paternal Uncle   . Cervical cancer Mother   . Hypertension Mother   . Cancer Mother   . Heart disease Father   . Hypertension Father   . GER disease Father   . Heart attack Father   . Bleeding Disorder Father   . Diabetes Sister   . Heart disease Sister   . Hypertension Sister    History  Substance Use Topics  . Smoking status: Former Smoker -- 0.50 packs/day  for 20 years    Types: Cigarettes    Quit date: 11/19/2009  . Smokeless tobacco: Former Neurosurgeon     Comment: Quit x 4 years  . Alcohol Use: No   OB History    Gravida Para Term Preterm AB TAB SAB Ectopic Multiple Living   6 1 1  5  3 2        Review of Systems  All other systems reviewed and are negative.     Allergies  Cymbalta; Trazodone and nefazodone; and Lyrica  Home Medications   Prior to Admission medications   Medication Sig Start Date End Date Taking? Authorizing Provider  albuterol (PROVENTIL HFA;VENTOLIN HFA) 108 (90 BASE) MCG/ACT inhaler Inhale 2 puffs into the lungs every 6 (six) hours as needed for wheezing or shortness of breath.    Historical Provider, MD  allopurinol (ZYLOPRIM) 300 MG tablet Take 1 tablet (300 mg total) by mouth daily. 08/14/14   [name] D Clegg, NP  allopurinol (ZYLOPRIM) 300 MG tablet TAKE 1 TABLET BY MOUTH ONCE DAILY. 03/05/15   [name] D Filbert Schilder, NP  amiodarone (PACERONE) 200 MG tablet Take 0.5 tablets (100 mg total) by mouth daily. 01/11/15   Abelino Derrick, PA-C  aspirin EC 81 MG EC tablet Take 1 tablet (81 mg total) by mouth daily. 12/16/13   Aundria Rud, NP  carvedilol (COREG) 6.25 MG tablet Take 0.5 tablets (3.125 mg total) by mouth 2 (two) times daily with a meal. 01/26/15   [name] D Clegg, NP  CORLANOR 5 MG TABS tablet TAKE (1/2) TABLET BY MOUTH TWICE DAILY WITH A MEAL. 03/19/15   Dolores Patty, MD  cyclobenzaprine (FLEXERIL) 10 MG tablet Take 10 mg by mouth 3 (three) times daily as needed for muscle spasms.     Historical Provider, MD  digoxin (LANOXIN) 0.125 MG tablet Take 0.5 tablets (0.0625 mg total) by mouth daily. 01/01/15   [name] D Filbert Schilder, NP  ferrous sulfate 325 (65 FE) MG tablet Take 325 mg by mouth 3 (three) times daily.    Historical Provider, MD  gabapentin (NEURONTIN) 300 MG capsule Take 600 mg by mouth 4 (four) times daily.    Historical Provider, MD  insulin aspart (NOVOLOG) 100 UNIT/ML injection Inject 0-20 Units into the skin 3 (three) times  daily with meals. Patient taking differently: Inject 0-20 Units into the skin 3 (three) times daily with meals. Per sliding scale 06/28/12   Gareth Morgan, MD  insulin detemir (LEVEMIR) 100 UNIT/ML injection Inject 100 Units into the skin at bedtime.     Historical Provider, MD  Linaclotide Karlene Einstein) 290 MCG CAPS capsule Take 1 capsule (290 mcg total) by  mouth daily. 30 minutes before breakfast 02/12/15   Nira Retort, NP  losartan (COZAAR) 25 MG tablet Take 0.5 tablets (12.5 mg total) by mouth every evening. 02/02/15   Laurey Morale, MD  magnesium oxide (MAG-OX) 400 (241.3 MG) MG tablet Take 1 tablet (400 mg total) by mouth daily. 01/01/15   [name] D Filbert Schilder, NP  metolazone (ZAROXOLYN) 2.5 MG tablet Take 1 tablet (2.5 mg total) by mouth once a week. Take one tablet by mouth 30 minutes before taking morning dose of Torsemide on Tuesdays Patient not taking: Reported on 03/01/2015 01/11/15   Abelino Derrick, PA-C  omeprazole (PRILOSEC) 40 MG capsule Take 1 capsule (40 mg total) by mouth daily. 03/27/13   Marinus Maw, MD  oxyCODONE-acetaminophen (PERCOCET) 10-325 MG per tablet Take 0.5-1 tablets by mouth every 4 (four) hours as needed for pain.     Historical Provider, MD  phenazopyridine (PYRIDIUM) 200 MG tablet Take 1 tablet (200 mg total) by mouth 3 (three) times daily. Patient not taking: Reported on 03/01/2015 01/20/15   Azalia Bilis, MD  potassium chloride SA (K-DUR,KLOR-CON) 20 MEQ tablet Take 80 MEQ (4 tablets) am and 60 MEQ (3 tablets) pm 02/02/15   Laurey Morale, MD  spironolactone (ALDACTONE) 25 MG tablet Take 1 tablet (25 mg total) by mouth 2 (two) times daily. 01/01/15   [name] D Clegg, NP  temazepam (RESTORIL) 30 MG capsule Take 30 mg by mouth at bedtime.    Historical Provider, MD  torsemide (DEMADEX) 100 MG tablet Take 1 tablet (100 mg total) by mouth 2 (two) times daily. 02/02/15   Laurey Morale, MD  vitamin E 400 UNIT capsule Take 400 Units by mouth daily.    Historical Provider, MD   Pulse 89   Resp 23  SpO2 97%  LMP 01/19/2014 Physical Exam  Constitutional: No distress.  HENT:  Head: Normocephalic.  Right Ear: External ear normal.  Left Ear: External ear normal.  Abrasions to the left side of her face  Eyes: Conjunctivae are normal. Right eye exhibits no discharge. Left eye exhibits no discharge. No scleral icterus.  Neck: Neck supple. No tracheal deviation present.  Cardiovascular: Normal rate and intact distal pulses.  An irregular rhythm present.  Extrasystoles are present.  Pulmonary/Chest: Effort normal and breath sounds normal. No stridor. No respiratory distress. She has no wheezes. She has no rales.  Abdominal: Soft. Bowel sounds are normal. She exhibits no distension. There is no tenderness. There is no rebound and no guarding.  Musculoskeletal: She exhibits no edema or tenderness.       Cervical back: Normal.       Thoracic back: Normal.       Lumbar back: Normal.  Neurological: She is alert. She has normal strength. No cranial nerve deficit (no facial droop, extraocular movements intact, no slurred speech) or sensory deficit. She exhibits normal muscle tone. She displays no seizure activity. Coordination normal.  Skin: Skin is warm and dry. No rash noted. She is not diaphoretic.  Psychiatric: She has a normal mood and affect.  Nursing note and vitals reviewed.   ED Course  Procedures (including critical care time) Labs Review Labs Reviewed  CBC - Abnormal; Notable for the following:    RDW 16.0 (*)    All other components within normal limits  COMPREHENSIVE METABOLIC PANEL - Abnormal; Notable for the following:    Chloride 98 (*)    Glucose, Bld 180 (*)    Creatinine, Ser 1.20 (*)  Albumin 3.4 (*)    GFR calc non Af Amer 52 (*)    All other components within normal limits  PROTIME-INR - Abnormal; Notable for the following:    Prothrombin Time 15.9 (*)    All other components within normal limits  BRAIN NATRIURETIC PEPTIDE - Abnormal; Notable for the  following:    B Natriuretic Peptide 549.5 (*)    All other components within normal limits  APTT  MAGNESIUM  I-STAT TROPOININ, ED    Imaging Review Dg Chest 2 View  03/26/2015   CLINICAL DATA:  Chest pain, syncope for 1 day  EXAM: CHEST  2 VIEW  COMPARISON:  01/20/2015  FINDINGS: There is bilateral mild interstitial thickening. There is no focal parenchymal opacity. There is no pleural effusion or pneumothorax. There is stable cardiomegaly. There is an AICD noted.  The osseous structures are unremarkable.  IMPRESSION: Cardiomegaly with mild pulmonary vascular congestion.   Electronically Signed   By: Elige Ko   On: 03/26/2015 17:32   Ct Head Wo Contrast  03/26/2015   CLINICAL DATA:  Syncope, fall from sitting position  EXAM: CT HEAD WITHOUT CONTRAST  TECHNIQUE: Contiguous axial images were obtained from the base of the skull through the vertex without intravenous contrast.  COMPARISON:  04/28/2010  FINDINGS: No skull fracture is noted. Paranasal sinuses and mastoid air cells are unremarkable. No intracranial hemorrhage, mass effect or midline shift. No acute cortical infarction. No mass lesion is noted on this unenhanced scan.  IMPRESSION: No acute intracranial abnormality.  No significant change.   Electronically Signed   By: Natasha Mead M.D.   On: 03/26/2015 17:17     EKG Interpretation   Date/Time:  Friday March 26 2015 16:01:55 EDT Ventricular Rate:  93 PR Interval:  62 QRS Duration: 131 QT Interval:  405 QTC Calculation: 504 R Axis:   122 Text Interpretation:  Atrial-sensed ventricular-paced complexes No further  analysis attempted due to paced rhythm No significant change since last  tracing Confirmed by Almir Botts  MD-J, Shantel Wesely ([id number]) on 03/26/2015 4:14:32 PM     Medications  furosemide (LASIX) 100 mg in dextrose 5 % 50 mL IVPB (not administered)    MDM   Final diagnoses:  Acute on chronic congestive heart failure, unspecified congestive heart failure type  Syncope,  unspecified syncope type    The patient presented to the emergency room with complaints of worsening shortness of breath associated with a syncopal episode today. Patient does have a history of congestive heart failure. She has noticed a weight gain. Patient's initial laboratory and x-ray testing is consistent with an exacerbation of her congestive heart failure. I ordered a dose of Lasix. Plan on admission to the hospital for further observation and treatment   Linwood Dibbles, MD 03/26/15 1746

## 2015-03-26 NOTE — ED Notes (Signed)
IV team at bedside 

## 2015-03-26 NOTE — H&P (Signed)
History and Physical        Hospital Admission Note Date: 03/26/2015  Patient name: [name] Medical record number: [id number] Date of birth: [birth year] Age: 49 y.o. Gender: female  PCP: [name], MD  Referring physician: Dr Lynelle Doctor  Chief Complaint:  Syncopal episode  HPI: Patient is a 49 year old female with morbid obesity, obstructive sleep apnea on CPAP, nonischemic cardiomyopathy, chronic systolic and diastolic CHF, EF 01-02% with grade 3 diastolic dysfunction per echo in March 2016, GERD, diabetes mellitus on insulin presented to ED with syncopal episode and progressive worsening shortness of breath. History was obtained from the patient and her spouse in the room. Patient reported that she had noticed about 12 pounds weight gain in the last few weeks with abdominal distention and worsening shortness of breath, using 2-3 pillows to sleep. Patient reports that she had to reschedule her prior appointment with Dr. [name], and her next appointment was next week. Patient had called [facility] hotline nurse today regarding her weight gain when while talking to the nurse she experienced a syncopal episode and fell on the floor striking the left side of her head. Patient's husband came to the house in the few minutes and found her on the floor however she was alert and awake at that time. Patient reported chest soreness/pressure for last few days, constant, with no radiation, no nausea or vomiting or diaphoresis. She did have a feeling of dizziness and lightheadedness prior to the syncopal episode.  EDP note reviewed, troponin 0.0, BNP elevated 549, creatinine 1.2, CBC unremarkable, BMET otherwise unremarkable except creatinine 1.2, BUN 14 Chest x-ray showed cardiomegaly with mild pulmonary vascular congestion.  Review of Systems:  Constitutional: Denies fever, chills,  diaphoresis, poor appetite and + fatigue.  HEENT: Denies photophobia, eye pain, redness, hearing loss, ear pain, congestion, sore throat, rhinorrhea, sneezing, mouth sores, trouble swallowing, neck pain, neck stiffness and tinnitus.   Respiratory: Please see history of present illness Cardiovascular: Please see history of present illness Gastrointestinal: Denies nausea, vomiting, abdominal pain, diarrhea, constipation, blood in stool and +abdominal distention.  Genitourinary: Denies dysuria, urgency, frequency, hematuria, flank pain and difficulty urinating.  Musculoskeletal: Denies myalgias, back pain, joint swelling, arthralgias and gait problem.  Skin: Denies pallor, rash and wound.  Neurological: Denies dizziness, seizures, syncope, weakness, light-headedness, numbness and headaches.  Hematological: Denies adenopathy. Easy bruising, personal or family bleeding history  Psychiatric/Behavioral: Denies suicidal ideation, mood changes, confusion, nervousness, sleep disturbance and agitation  Past Medical History: Past Medical History  Diagnosis Date  . CHF (congestive heart failure)     a. EF 30-35%, RV mildly dilated (difficult study 11/2013) b. RHC (12/2013): RA 38/37 (35), RV 90/28, PA 95/51 (71), PCWP 54, PA 40%, AO 96 %, CO/CI Fick 3.2/1.4, PVR 5.3   . Nonischemic cardiomyopathy 12/2013    a. LHC (12/2013) normal coronary anatomy   . Mitral regurgitation     moderate to severe  . LBBB (left bundle branch block)   . HTN (hypertension)   . Asthma   . IBS (irritable bowel syndrome)     with primarily constipation  . Morbid obesity   . GERD (gastroesophageal reflux disease)   . IDA (  iron deficiency anemia)     2o TO SB AVMS, Hx parenteral iron Dr Mariel Sleet  . AVM (arteriovenous malformation)     Small bowel, s/p duble balloon enteroscopy/APC jejunum Dr Gwinda [facility]) 01/23/2011  . Peripheral neuropathy   . OSA (obstructive sleep apnea)     on CPAP qhs  . Diabetes mellitus without  complication   . Torsades de pointes     a. appropriate ICD therapy 12/2014 in setting of hypokalemia    Past Surgical History  Procedure Laterality Date  . Appendectomy    . Cholecystectomy      biliary dyskinesia  . Mastectomy  2003    left, partial  . Knee arthroscopy  2005    left  . Small bowel enteroscopy  MAY 2012 [facility] DR. GILLIAM    SB AVMS s/p APC  . Colonoscopy  OCT 2010/SEP 2011    tortuous colon, 3 polyps-benign(2010),   . Upper gastrointestinal endoscopy  '08, '10, SEP 2011    mild antral gastritis (2010), negative SB bx (2010), incomlpete Schatzki's ring (2011)  . Small bowel enteroscopy  SEP 2011 [facility]    Fields-NO AVMS  . Givens capsule study  NOV 2010     NO AVMS, normal  . Irrigation and debridement abscess  06/25/2012    Procedure: MINOR INCISION AND DRAINAGE OF ABSCESS;  Surgeon: [name], MD;  Location: [facility] ORS;  Service: General;  Laterality: [name];  Incision & Drainage of Infected Sebaceous Cyst on Chest  . Breast surgery    . Implantable cardioverter defibrillator generator change Left 02/15/2012    BSX CRTD   . Left and right heart catheterization with coronary/graft angiogram  12/12/2013    Procedure: LEFT AND RIGHT HEART CATHETERIZATION WITH Isabel Caprice;  Surgeon: Peter M Swaziland, MD;  Location: [facility] CATH LAB;  Service: Cardiovascular;;  . Right heart catheterization [name] 01/06/2015    Procedure: RIGHT HEART CATH;  Surgeon: Laurey Morale, MD;  Location: [facility] CATH LAB;  Service: Cardiovascular;  Laterality: [name];  . Esophagogastroduodenoscopy  Sept 2013    [facility]: diminutive duodenal polyp, polyp in gastric cardia, path with benign duodenal mucosa with focally prominent Brunner's glands. Fundic gland polyps.     Medications: Prior to Admission medications   Medication Sig Start Date End Date Taking? Authorizing Provider  albuterol (PROVENTIL HFA;VENTOLIN HFA) 108 (90 BASE) MCG/ACT inhaler Inhale 2 puffs into the lungs every 6 (six)  hours as needed for wheezing or shortness of breath.   Yes Historical Provider, MD  allopurinol (ZYLOPRIM) 300 MG tablet Take 1 tablet (300 mg total) by mouth daily. 08/14/14  Yes [name] D Clegg, NP  amiodarone (PACERONE) 200 MG tablet Take 0.5 tablets (100 mg total) by mouth daily. 01/11/15  Yes Abelino Derrick, PA-C  aspirin EC 81 MG EC tablet Take 1 tablet (81 mg total) by mouth daily. 12/16/13  Yes Aundria Rud, NP  carvedilol (COREG) 6.25 MG tablet Take 0.5 tablets (3.125 mg total) by mouth 2 (two) times daily with a meal. 01/26/15  Yes [name] D Clegg, NP  CORLANOR 5 MG TABS tablet TAKE (1/2) TABLET BY MOUTH TWICE DAILY WITH A MEAL. 03/19/15  Yes Dolores Patty, MD  cyclobenzaprine (FLEXERIL) 10 MG tablet Take 10 mg by mouth 3 (three) times daily as needed for muscle spasms.    Yes Historical Provider, MD  digoxin (LANOXIN) 0.125 MG tablet Take 0.5 tablets (0.0625 mg total) by mouth daily. 01/01/15  Yes [name] Georgie Chard, NP  DULoxetine (CYMBALTA) 30 MG capsule Take 30 mg by mouth daily. 02/24/15  Yes Historical Provider, MD  ferrous sulfate 325 (65 FE) MG tablet Take 325 mg by mouth 3 (three) times daily.   Yes Historical Provider, MD  gabapentin (NEURONTIN) 300 MG capsule Take 600 mg by mouth 4 (four) times daily.   Yes Historical Provider, MD  insulin aspart (NOVOLOG) 100 UNIT/ML injection Inject 0-20 Units into the skin 3 (three) times daily with meals. Patient taking differently: Inject 0-20 Units into the skin 3 (three) times daily with meals. Per sliding scale: 120 - 150 = 2 units, 151 - 200 = 4 units, 201 - 250 = 7 units, 251 - 300 = 11 units, 301 - 350 = 15 units, 351 and above = 20 units 06/28/12  Yes Gareth Morgan, MD  insulin detemir (LEVEMIR) 100 UNIT/ML injection Inject 100 Units into the skin at bedtime.    Yes Historical Provider, MD  Linaclotide (LINZESS) 290 MCG CAPS capsule Take 1 capsule (290 mcg total) by mouth daily. 30 minutes before breakfast 02/12/15  Yes Nira Retort, NP  losartan (COZAAR)  25 MG tablet Take 0.5 tablets (12.5 mg total) by mouth every evening. 02/02/15  Yes Laurey Morale, MD  magnesium oxide (MAG-OX) 400 (241.3 MG) MG tablet Take 1 tablet (400 mg total) by mouth daily. 01/01/15  Yes [name] D Clegg, NP  omeprazole (PRILOSEC) 40 MG capsule Take 1 capsule (40 mg total) by mouth daily. 03/27/13  Yes Marinus Maw, MD  oxyCODONE-acetaminophen (PERCOCET) 10-325 MG per tablet Take 0.5-1 tablets by mouth every 4 (four) hours as needed for pain.    Yes Historical Provider, MD  potassium chloride SA (K-DUR,KLOR-CON) 20 MEQ tablet Take 80 MEQ (4 tablets) am and 60 MEQ (3 tablets) pm 02/02/15  Yes Laurey Morale, MD  spironolactone (ALDACTONE) 25 MG tablet Take 1 tablet (25 mg total) by mouth 2 (two) times daily. 01/01/15  Yes [name] D Clegg, NP  temazepam (RESTORIL) 30 MG capsule Take 30 mg by mouth at bedtime.   Yes Historical Provider, MD  torsemide (DEMADEX) 100 MG tablet Take 1 tablet (100 mg total) by mouth 2 (two) times daily. 02/02/15  Yes Laurey Morale, MD  vitamin E 400 UNIT capsule Take 400 Units by mouth daily.   Yes Historical Provider, MD  allopurinol (ZYLOPRIM) 300 MG tablet TAKE 1 TABLET BY MOUTH ONCE DAILY. Patient not taking: Reported on 03/26/2015 03/05/15   [name] D Clegg, NP  metolazone (ZAROXOLYN) 2.5 MG tablet Take 1 tablet (2.5 mg total) by mouth once a week. Take one tablet by mouth 30 minutes before taking morning dose of Torsemide on Tuesdays Patient not taking: Reported on 03/01/2015 01/11/15   Abelino Derrick, PA-C  phenazopyridine (PYRIDIUM) 200 MG tablet Take 1 tablet (200 mg total) by mouth 3 (three) times daily. Patient not taking: Reported on 03/01/2015 01/20/15   Azalia Bilis, MD    Allergies:   Allergies  Allergen Reactions  . Cymbalta [Duloxetine Hcl] Other (See Comments)    Only in a mild dose pt can tolerate  . Trazodone And Nefazodone Other (See Comments)    Nightmares   . Lyrica [Pregabalin] Swelling    Social History:  reports that she quit  smoking about 5 years ago. Her smoking use included Cigarettes. She has a 10 pack-year smoking history. She has quit using smokeless tobacco. She reports that she does not drink alcohol or use illicit drugs.  Family History: Family History  Problem Relation Age of Onset  . Colon cancer Paternal Uncle   . Cervical cancer Mother   . Hypertension Mother   . Cancer Mother   . Heart disease Father   . Hypertension Father   . GER disease Father   . Heart attack Father   . Bleeding Disorder Father   . Diabetes Sister   . Heart disease Sister   . Hypertension Sister     Physical Exam: Blood pressure 101/71, pulse 69, temperature 98.2 F (36.8 C), temperature source Oral, resp. rate 18, last menstrual period 01/19/2014, SpO2 95 %. General: Alert, awake, oriented x3, in no acute distress. HEENT: normocephalic, atraumatic, anicteric sclera, pink conjunctiva, pupils equal and reactive to light and accomodation, oropharynx clear Neck: supple, no masses or lymphadenopathy, no goiter, no bruits  Heart: Regular rate and rhythm, without murmurs, rubs or gallops. Lungs:  decreased breath sounds at the bases Abdomen: Morbidly obese, Soft, nontender, nondistended, positive bowel sounds, no masses. Extremities: No clubbing, cyanosis or edema with positive pedal pulses. Neuro: Grossly intact, no focal neurological deficits, strength 5/5 upper and lower extremities bilaterally Psych: alert and oriented x 3, normal mood and affect Skin: no rashes or lesions, warm and dry   LABS on Admission:  Basic Metabolic Panel:  Recent Labs Lab 03/26/15 1633  NA 138  K 3.7  CL 98*  CO2 29  GLUCOSE 180*  BUN 14  CREATININE 1.20*  CALCIUM 9.0  MG 1.8   Liver Function Tests:  Recent Labs Lab 03/26/15 1633  AST 32  ALT 23  ALKPHOS 102  BILITOT 0.9  PROT 7.9  ALBUMIN 3.4*   No results for input(s): LIPASE, AMYLASE in the last 168 hours. No results for input(s): AMMONIA in the last 168  hours. CBC:  Recent Labs Lab 03/26/15 1633  WBC 5.7  HGB 14.4  HCT 44.3  MCV 93.9  PLT 231   Cardiac Enzymes: No results for input(s): CKTOTAL, CKMB, CKMBINDEX, TROPONINI in the last 168 hours. BNP: Invalid input(s): POCBNP CBG: No results for input(s): GLUCAP in the last 168 hours.  Radiological Exams on Admission:  Dg Chest 2 View  03/26/2015   CLINICAL DATA:  Chest pain, syncope for 1 day  EXAM: CHEST  2 VIEW  COMPARISON:  01/20/2015  FINDINGS: There is bilateral mild interstitial thickening. There is no focal parenchymal opacity. There is no pleural effusion or pneumothorax. There is stable cardiomegaly. There is an AICD noted.  The osseous structures are unremarkable.  IMPRESSION: Cardiomegaly with mild pulmonary vascular congestion.   Electronically Signed   By: Elige Ko   On: 03/26/2015 17:32   Ct Head Wo Contrast  03/26/2015   CLINICAL DATA:  Syncope, fall from sitting position  EXAM: CT HEAD WITHOUT CONTRAST  TECHNIQUE: Contiguous axial images were obtained from the base of the skull through the vertex without intravenous contrast.  COMPARISON:  04/28/2010  FINDINGS: No skull fracture is noted. Paranasal sinuses and mastoid air cells are unremarkable. No intracranial hemorrhage, mass effect or midline shift. No acute cortical infarction. No mass lesion is noted on this unenhanced scan.  IMPRESSION: No acute intracranial abnormality.  No significant change.   Electronically Signed   By: Natasha Mead M.D.   On: 03/26/2015 17:17    *I have personally reviewed the images above*  EKG: Independently reviewed. Rate 93, paced rhythm   Assessment/Plan Principal Problem:   Syncope - Admit to telemetry, rule out any arrhythmia, rule out acute ACS, obtain 2-D echocardiogram,  serial cardiac enzymes, d-dimer - Patient also appears to be in fluid overload, has gained 12 pounds in last 2 weeks with abdominal distention.  Active Problems:   Acute on chronic combined systolic and  diastolic CHF : Patient reports being compliant to medication and diet - Admitted with CHF pathway, rule out acute ACS, placed on IV Lasix 80 mg IV Q12hrs. Patient has received 100 mg IV Lasix in the ED. - will continue Coreg, digoxin, amiodarone, corlanor, losartan, metolazone x1, Aldactone - Patient follows Dr. [name], has not seen Dr. [name] in last 3 months, will consult cardiology in a.m. - Place on fluid restriction, Foley catheter, strict I's and O's and daily weights, daily BMET    GERD (gastroesophageal reflux disease) - Continue Protonix    Essential hypertension - Currently stable, continue Coreg, losartan, metolazone, Lasix   Atypical Chest pain: Described as chest soreness - Rule out acute ACS, obtain 2-D echo, d-dimer  Insulin-dependent diabetes mellitus - Placed on Levemir and settings are insulin, obtain hemoglobin A1c  Morbid obesity - Patient counseled on diet and weight control  Obstructive sleep apnea - Placed on CPAP  DVT prophylaxis: Heparin subcutaneous  CODE STATUS: Full CODE STATUS  Family Communication: Admission, patients condition and plan of care including tests being ordered have been discussed with the patient and husband who indicates understanding and agree with the plan and Code Status  Disposition plan: Further plan will depend as patient's clinical course evolves and further radiologic and laboratory data become available.  Time Spent on Admission: 1 hour  RAI,RIPUDEEP M.D. [facility] 03/26/2015, 6:26 PM Pager: [phone]  If 7PM-7AM, please contact night-coverage [email or web address] Password [email or web address]

## 2015-03-26 NOTE — ED Notes (Signed)
Patient transported to X-ray 

## 2015-03-26 NOTE — Telephone Encounter (Signed)
Received call from [place], RN with [facility] [place] she states pt called her c/o increase wt gain and SOB, she reports pt's wt was 268 last week and is  280 lb today.  I then called pt and spoke w/her she states wt has been going up gradually going up for past week.  She states she has been taking her Torsemide 100 mg bid and has not missed any doses, she states Dr [name] stopped her Metolazone so she is no longer it.  She states she has felt so bad the past 3 days she has stayed in bed with bipap on.  She c/o SOB at rest and states her legs are swollen and her abd.  Then pt stated she felt like she was going to pass out, there was commotion on the line then pt would not respond.  Immediatly hung up from pt and called 911, they are sending EMS and police out to patient's home now.  Lafayette Dragon, RN back to let her know what had happened.

## 2015-03-26 NOTE — ED Notes (Signed)
MD at bedside. 

## 2015-03-26 NOTE — ED Notes (Signed)
Attempted report, room unassigned, nurse to call back for report.

## 2015-03-26 NOTE — Progress Notes (Signed)
CC'ED TO PCP 

## 2015-03-26 NOTE — ED Notes (Signed)
Pt arrives via [facility] for c/o pressure in her chest and syncopal episode. Pt felt sob and called a nurse line, while on the phone pt had syncopal episode and fell to the floor from sitting position. Pt alert, oriented. Reports pain in her head from the fall and pain in her left chest that is constant. Pt has pacemaker, defibrillator, no firing from pacemaker today.

## 2015-03-26 NOTE — Telephone Encounter (Signed)
Pt in ER now.

## 2015-03-27 ENCOUNTER — Inpatient Hospital Stay (HOSPITAL_COMMUNITY): Payer: Medicare Other

## 2015-03-27 ENCOUNTER — Encounter (HOSPITAL_COMMUNITY): Payer: Self-pay | Admitting: Radiology

## 2015-03-27 ENCOUNTER — Observation Stay (HOSPITAL_COMMUNITY): Payer: Medicare Other

## 2015-03-27 DIAGNOSIS — I5023 Acute on chronic systolic (congestive) heart failure: Secondary | ICD-10-CM | POA: Diagnosis not present

## 2015-03-27 DIAGNOSIS — Z7982 Long term (current) use of aspirin: Secondary | ICD-10-CM | POA: Diagnosis not present

## 2015-03-27 DIAGNOSIS — Z9012 Acquired absence of left breast and nipple: Secondary | ICD-10-CM | POA: Diagnosis present

## 2015-03-27 DIAGNOSIS — D509 Iron deficiency anemia, unspecified: Secondary | ICD-10-CM | POA: Diagnosis present

## 2015-03-27 DIAGNOSIS — R791 Abnormal coagulation profile: Secondary | ICD-10-CM | POA: Diagnosis not present

## 2015-03-27 DIAGNOSIS — Y92009 Unspecified place in unspecified non-institutional (private) residence as the place of occurrence of the external cause: Secondary | ICD-10-CM | POA: Diagnosis not present

## 2015-03-27 DIAGNOSIS — R55 Syncope and collapse: Secondary | ICD-10-CM | POA: Diagnosis present

## 2015-03-27 DIAGNOSIS — G4733 Obstructive sleep apnea (adult) (pediatric): Secondary | ICD-10-CM | POA: Diagnosis present

## 2015-03-27 DIAGNOSIS — Z888 Allergy status to other drugs, medicaments and biological substances status: Secondary | ICD-10-CM | POA: Diagnosis not present

## 2015-03-27 DIAGNOSIS — Z6841 Body Mass Index (BMI) 40.0 and over, adult: Secondary | ICD-10-CM | POA: Diagnosis not present

## 2015-03-27 DIAGNOSIS — J45909 Unspecified asthma, uncomplicated: Secondary | ICD-10-CM | POA: Diagnosis present

## 2015-03-27 DIAGNOSIS — I129 Hypertensive chronic kidney disease with stage 1 through stage 4 chronic kidney disease, or unspecified chronic kidney disease: Secondary | ICD-10-CM | POA: Diagnosis present

## 2015-03-27 DIAGNOSIS — N183 Chronic kidney disease, stage 3 (moderate): Secondary | ICD-10-CM | POA: Diagnosis not present

## 2015-03-27 DIAGNOSIS — I493 Ventricular premature depolarization: Secondary | ICD-10-CM | POA: Diagnosis present

## 2015-03-27 DIAGNOSIS — Z8249 Family history of ischemic heart disease and other diseases of the circulatory system: Secondary | ICD-10-CM | POA: Diagnosis not present

## 2015-03-27 DIAGNOSIS — S00212A Abrasion of left eyelid and periocular area, initial encounter: Secondary | ICD-10-CM | POA: Diagnosis present

## 2015-03-27 DIAGNOSIS — R570 Cardiogenic shock: Secondary | ICD-10-CM | POA: Diagnosis present

## 2015-03-27 DIAGNOSIS — F419 Anxiety disorder, unspecified: Secondary | ICD-10-CM | POA: Diagnosis present

## 2015-03-27 DIAGNOSIS — I509 Heart failure, unspecified: Secondary | ICD-10-CM | POA: Diagnosis not present

## 2015-03-27 DIAGNOSIS — Z8679 Personal history of other diseases of the circulatory system: Secondary | ICD-10-CM | POA: Diagnosis not present

## 2015-03-27 DIAGNOSIS — R079 Chest pain, unspecified: Secondary | ICD-10-CM | POA: Diagnosis not present

## 2015-03-27 DIAGNOSIS — I429 Cardiomyopathy, unspecified: Secondary | ICD-10-CM | POA: Diagnosis not present

## 2015-03-27 DIAGNOSIS — Z95810 Presence of automatic (implantable) cardiac defibrillator: Secondary | ICD-10-CM | POA: Diagnosis not present

## 2015-03-27 DIAGNOSIS — R0600 Dyspnea, unspecified: Secondary | ICD-10-CM | POA: Diagnosis not present

## 2015-03-27 DIAGNOSIS — K589 Irritable bowel syndrome without diarrhea: Secondary | ICD-10-CM | POA: Diagnosis present

## 2015-03-27 DIAGNOSIS — I5043 Acute on chronic combined systolic (congestive) and diastolic (congestive) heart failure: Secondary | ICD-10-CM | POA: Diagnosis present

## 2015-03-27 DIAGNOSIS — Z01810 Encounter for preprocedural cardiovascular examination: Secondary | ICD-10-CM | POA: Diagnosis not present

## 2015-03-27 DIAGNOSIS — N179 Acute kidney failure, unspecified: Secondary | ICD-10-CM | POA: Diagnosis present

## 2015-03-27 DIAGNOSIS — Z79899 Other long term (current) drug therapy: Secondary | ICD-10-CM | POA: Diagnosis not present

## 2015-03-27 DIAGNOSIS — E1122 Type 2 diabetes mellitus with diabetic chronic kidney disease: Secondary | ICD-10-CM | POA: Diagnosis present

## 2015-03-27 DIAGNOSIS — Z886 Allergy status to analgesic agent status: Secondary | ICD-10-CM | POA: Diagnosis not present

## 2015-03-27 DIAGNOSIS — I10 Essential (primary) hypertension: Secondary | ICD-10-CM | POA: Diagnosis not present

## 2015-03-27 DIAGNOSIS — Z794 Long term (current) use of insulin: Secondary | ICD-10-CM | POA: Diagnosis not present

## 2015-03-27 DIAGNOSIS — E1142 Type 2 diabetes mellitus with diabetic polyneuropathy: Secondary | ICD-10-CM | POA: Diagnosis present

## 2015-03-27 DIAGNOSIS — Z79891 Long term (current) use of opiate analgesic: Secondary | ICD-10-CM | POA: Diagnosis not present

## 2015-03-27 DIAGNOSIS — E871 Hypo-osmolality and hyponatremia: Secondary | ICD-10-CM | POA: Diagnosis not present

## 2015-03-27 DIAGNOSIS — I5022 Chronic systolic (congestive) heart failure: Secondary | ICD-10-CM | POA: Diagnosis not present

## 2015-03-27 DIAGNOSIS — I472 Ventricular tachycardia: Secondary | ICD-10-CM | POA: Diagnosis present

## 2015-03-27 DIAGNOSIS — Z87891 Personal history of nicotine dependence: Secondary | ICD-10-CM | POA: Diagnosis not present

## 2015-03-27 DIAGNOSIS — K219 Gastro-esophageal reflux disease without esophagitis: Secondary | ICD-10-CM | POA: Diagnosis present

## 2015-03-27 DIAGNOSIS — I4901 Ventricular fibrillation: Secondary | ICD-10-CM | POA: Diagnosis present

## 2015-03-27 DIAGNOSIS — K210 Gastro-esophageal reflux disease with esophagitis: Secondary | ICD-10-CM | POA: Diagnosis not present

## 2015-03-27 DIAGNOSIS — R339 Retention of urine, unspecified: Secondary | ICD-10-CM | POA: Diagnosis not present

## 2015-03-27 DIAGNOSIS — W1830XA Fall on same level, unspecified, initial encounter: Secondary | ICD-10-CM | POA: Diagnosis present

## 2015-03-27 DIAGNOSIS — I272 Other secondary pulmonary hypertension: Secondary | ICD-10-CM | POA: Diagnosis present

## 2015-03-27 DIAGNOSIS — K5900 Constipation, unspecified: Secondary | ICD-10-CM | POA: Diagnosis not present

## 2015-03-27 LAB — BASIC METABOLIC PANEL
Anion gap: 10 (ref 5–15)
Anion gap: 11 (ref 5–15)
BUN: 14 mg/dL (ref 6–20)
BUN: 19 mg/dL (ref 6–20)
CALCIUM: 9.1 mg/dL (ref 8.9–10.3)
CO2: 31 mmol/L (ref 22–32)
CO2: 34 mmol/L — ABNORMAL HIGH (ref 22–32)
CREATININE: 1.17 mg/dL — AB (ref 0.44–1.00)
Calcium: 8.9 mg/dL (ref 8.9–10.3)
Chloride: 96 mmol/L — ABNORMAL LOW (ref 101–111)
Chloride: 91 mmol/L — ABNORMAL LOW (ref 101–111)
Creatinine, Ser: 1.34 mg/dL — ABNORMAL HIGH (ref 0.44–1.00)
GFR calc Af Amer: >60 mL/min (ref >60)
GFR, EST AFRICAN AMERICAN: 53 mL/min — AB (ref >60)
GFR, EST NON AFRICAN AMERICAN: 54 mL/min — AB (ref >60)
GFR, EST NON AFRICAN AMERICAN: 46 mL/min — AB (ref >60)
Glucose, Bld: 107 mg/dL — ABNORMAL HIGH (ref 65–99)
Glucose, Bld: 154 mg/dL — ABNORMAL HIGH (ref 65–99)
POTASSIUM: 4.4 mmol/L (ref 3.5–5.1)
Potassium: 4.1 mmol/L (ref 3.5–5.1)
SODIUM: 137 mmol/L (ref 135–145)
SODIUM: 136 mmol/L (ref 135–145)

## 2015-03-27 LAB — GLUCOSE, CAPILLARY
GLUCOSE-CAPILLARY: 107 mg/dL — AB (ref 65–99)
GLUCOSE-CAPILLARY: 91 mg/dL (ref 65–99)
GLUCOSE-CAPILLARY: 138 mg/dL — AB (ref 65–99)
Glucose-Capillary: 104 mg/dL — ABNORMAL HIGH (ref 65–99)
Glucose-Capillary: 126 mg/dL — ABNORMAL HIGH (ref 65–99)

## 2015-03-27 LAB — MAGNESIUM: Magnesium: 2 mg/dL (ref 1.7–2.4)

## 2015-03-27 LAB — TROPONIN I
Troponin I: <0.03 ng/mL (ref <0.031)
Troponin I: 0.05 ng/mL — ABNORMAL HIGH (ref <0.031)

## 2015-03-27 MED ORDER — INSULIN DETEMIR 100 UNIT/ML ~~LOC~~ SOLN
50.0000 [IU] | Freq: Once | SUBCUTANEOUS | Status: AC
  Administered 2015-03-28: 50 [IU] via SUBCUTANEOUS
  Filled 2015-03-27: qty 0.5

## 2015-03-27 MED ORDER — MECLIZINE HCL 25 MG PO TABS
12.5000 mg | ORAL_TABLET | Freq: Two times a day (BID) | ORAL | Status: DC
  Administered 2015-03-27: 12.5 mg via ORAL
  Filled 2015-03-27: qty 1

## 2015-03-27 MED ORDER — BACITRACIN-NEOMYCIN-POLYMYXIN OINTMENT TUBE
TOPICAL_OINTMENT | Freq: Two times a day (BID) | CUTANEOUS | Status: DC
  Administered 2015-03-27 – 2015-03-28 (×3): 1 via TOPICAL
  Administered 2015-03-28 – 2015-03-29 (×3): via TOPICAL
  Administered 2015-03-30: 1 via TOPICAL
  Administered 2015-03-30 – 2015-03-31 (×2): via TOPICAL
  Administered 2015-04-01: 14.2 via TOPICAL
  Administered 2015-04-01: 10:00:00 via TOPICAL
  Administered 2015-04-02 – 2015-04-08 (×12): 14.2 via TOPICAL
  Filled 2015-03-27 (×3): qty 15

## 2015-03-27 MED ORDER — IOHEXOL 350 MG/ML SOLN
75.0000 mL | Freq: Once | INTRAVENOUS | Status: AC | PRN
  Administered 2015-03-27: 80 mL via INTRAVENOUS

## 2015-03-27 NOTE — Progress Notes (Addendum)
Rec'd pt from ER via stretcher. Assisted pt in to bed. VS obtained. Pt weighed. Assessment done and history staryed. [place] sandwich provided for dinner. Pt denies c/o pain but states she's " A little winded". Nasal O2 in use at 2l N/C.

## 2015-03-27 NOTE — Progress Notes (Signed)
[facility]                                                                              Patient Demographics  [name], is a 49 y.o. female, DOB - [birth year], CBS:[id number]  Admit date - 03/26/2015   Admitting Physician [name], MD  Outpatient Primary MD for the patient is [name], MD  LOS -    Chief Complaint  Patient presents with  . Loss of Consciousness       Brief HPI   Patient is a 50 year old female with morbid obesity, obstructive sleep apnea on CPAP, nonischemic cardiomyopathy, chronic systolic and diastolic CHF, EF 84-66% with grade 3 diastolic dysfunction per echo in March 2016, GERD, diabetes mellitus on insulin presented to ED with syncopal episode and progressive worsening shortness of breath. History was obtained from the patient and her spouse in the room. Patient reported that she had noticed about 12 pounds weight gain in the last few weeks with abdominal distention and worsening shortness of breath, using 2-3 pillows to sleep. Patient reports that she had to reschedule her prior appointment with Dr. [name], and her next appointment was next week. Patient had called [facility] hotline nurse today regarding her weight gain when while talking to the nurse she experienced a syncopal episode and fell on the floor striking the left side of her head. Patient's husband came to the house in the few minutes and found her on the floor however she was alert and awake at that time. Patient reported chest soreness/pressure for last few days, constant, with no radiation, no nausea or vomiting or diaphoresis. She did have a feeling of dizziness and lightheadedness prior to the syncopal episode. EDP note reviewed, troponin 0.0, BNP elevated 549, creatinine 1.2, CBC unremarkable, BMET otherwise unremarkable except creatinine 1.2, BUN 14 Chest x-ray showed cardiomegaly with mild pulmonary vascular congestion.   Assessment & Plan     Syncope:  Unclear etiology, possible vasovagal episode - First 2 troponins negative, third elevated at 0.05 -  2-D echocardiogram pending, d-dimer elevated 1.35, will check CT angiogram to rule out PE - Patient also in fluid overload, has chronic combined systolic and diastolic CHF, called cardiology, Dr. [name], will notify Dr. [name]  Active Problems:  Acute on chronic combined systolic and diastolic CHF : Patient reports being compliant to medication and diet - Admitted with CHF pathway, on IV Lasix 80 mg IV Q12hrs. Patient has received 100 mg IV Lasix in the ED. - will continue Coreg, digoxin, amiodarone, corlanor, losartan, metolazone x1, Aldactone - Continue strict I's and O's and daily weights, negative balance of 2.56 since admission -  called cardiology, Dr. [name], will notify Dr. [name]   GERD (gastroesophageal reflux disease) - Continue Protonix   Essential hypertension - Currently stable, continue Coreg, losartan, metolazone, Lasix  Atypical Chest pain: Improved - Obtain CTA chest to r/o PE, d dimer elevated at 1.35 - 2-D echo pending  Insulin-dependent diabetes mellitus - Placed on Levemir and settings are insulin - obtain hemoglobin A1c  Morbid obesity - Patient counseled on diet and weight control  Obstructive sleep apnea - Placed on  CPAP  Code Status: Full code  Family Communication: Discussed in detail with the patient, all imaging results, lab results explained to the patient    Disposition Plan: Follow cardiology recommendations  Time Spent in minutes   25 minutes  Procedures  CT angiogram chest, 2-D echo  Consults   Cardiology  DVT Prophylaxis  heparin subcutaneous  Medications  Scheduled Meds: . allopurinol  300 mg Oral Daily  . amiodarone  100 mg Oral Daily  . aspirin EC  81 mg Oral Daily  . carvedilol  3.125 mg Oral BID WC  . digoxin  0.0625 mg Oral Daily  . DULoxetine  30 mg Oral Daily  . ferrous sulfate  325 mg Oral TID  .  furosemide  80 mg Intravenous Q12H  . gabapentin  600 mg Oral QID  . heparin  5,000 Units Subcutaneous 3 times per day  . insulin aspart  0-15 Units Subcutaneous TID WC  . insulin aspart  0-5 Units Subcutaneous QHS  . insulin detemir  100 Units Subcutaneous QHS  . ivabradine  2.5 mg Oral BID WC  . Linaclotide  290 mcg Oral Daily  . losartan  12.5 mg Oral QPM  . magnesium oxide  400 mg Oral Daily  . neomycin-bacitracin-polymyxin   Topical BID  . pantoprazole  40 mg Oral Daily  . potassium chloride SA  40 mEq Oral BID  . sodium chloride  3 mL Intravenous Q12H  . spironolactone  25 mg Oral BID  . temazepam  30 mg Oral QHS  . vitamin E  400 Units Oral Daily   Continuous Infusions:  PRN Meds:.sodium chloride, acetaminophen, albuterol, cyclobenzaprine, ondansetron (ZOFRAN) IV, oxyCODONE-acetaminophen, sodium chloride   Antibiotics   Anti-infectives    None        Subjective:   Carolle Ishii was seen and examined today. Feeling somewhat better today, no dizziness or lightheadedness or chest pain.  Patient denies abdominal pain, N/V/D/C, new weakness, numbess, tingling. No acute events overnight.  Shortness of breath is improving. Unable to place Foley yesterday.  Objective:   Blood pressure 113/92, pulse 71, temperature 98.4 F (36.9 C), temperature source Oral, resp. rate 20, height 5\' 5"  (1.651 m), weight 125.3 kg (276 lb 3.8 oz), last menstrual period 01/19/2014, SpO2 100 %.  Wt Readings from Last 3 Encounters:  03/27/15 125.3 kg (276 lb 3.8 oz)  03/26/15 127.177 kg (280 lb 6 oz)  03/12/15 124.739 kg (275 lb)     Intake/Output Summary (Last 24 hours) at 03/27/15 1038 Last data filed at 03/27/15 0836  Gross per 24 hour  Intake    585 ml  Output   3150 ml  Net  -2565 ml    Exam  General: Alert and oriented x 3, NAD  HEENT:  PERRLA, EOMI, Anicteric Sclera, mucous membranes moist.   Neck: Supple, unable to assess JVD  CVS: S1 S2 auscultated, no rubs, murmurs or  gallops. Regular rate and rhythm.  Respiratory: Decreased breath sounds at the bases  Abdomen: Morbidly obese Soft, nontender, nondistended, + bowel sounds  Ext: no cyanosis clubbing or edema  Neuro: AAOx3, Cr N's II- XII. Strength 5/5 upper and lower extremities bilaterally  Skin: No rashes  Psych: Normal affect and demeanor, alert and oriented x3    Data Review   Micro Results No results found for this or any previous visit (from the past 240 hour(s)).  Radiology Reports Dg Chest 2 View  03/26/2015   CLINICAL DATA:  Chest pain, syncope for  1 day  EXAM: CHEST  2 VIEW  COMPARISON:  01/20/2015  FINDINGS: There is bilateral mild interstitial thickening. There is no focal parenchymal opacity. There is no pleural effusion or pneumothorax. There is stable cardiomegaly. There is an AICD noted.  The osseous structures are unremarkable.  IMPRESSION: Cardiomegaly with mild pulmonary vascular congestion.   Electronically Signed   By: Elige Ko   On: 03/26/2015 17:32   Ct Head Wo Contrast  03/26/2015   CLINICAL DATA:  Syncope, fall from sitting position  EXAM: CT HEAD WITHOUT CONTRAST  TECHNIQUE: Contiguous axial images were obtained from the base of the skull through the vertex without intravenous contrast.  COMPARISON:  04/28/2010  FINDINGS: No skull fracture is noted. Paranasal sinuses and mastoid air cells are unremarkable. No intracranial hemorrhage, mass effect or midline shift. No acute cortical infarction. No mass lesion is noted on this unenhanced scan.  IMPRESSION: No acute intracranial abnormality.  No significant change.   Electronically Signed   By: Natasha Mead M.D.   On: 03/26/2015 17:17    CBC  Recent Labs Lab 03/26/15 1633  WBC 5.7  HGB 14.4  HCT 44.3  PLT 231  MCV 93.9  MCH 30.5  MCHC 32.5  RDW 16.0*    Chemistries   Recent Labs Lab 03/26/15 1633 03/27/15 0245  NA 138 137  K 3.7 4.1  CL 98* 96*  CO2 29 31  GLUCOSE 180* 107*  BUN 14 14  CREATININE 1.20*  1.17*  CALCIUM 9.0 8.9  MG 1.8  --   AST 32  --   ALT 23  --   ALKPHOS 102  --   BILITOT 0.9  --    ------------------------------------------------------------------------------------------------------------------ estimated creatinine clearance is 77.4 mL/min (by C-G formula based on Cr of 1.17). ------------------------------------------------------------------------------------------------------------------ No results for input(s): HGBA1C in the last 72 hours. ------------------------------------------------------------------------------------------------------------------ No results for input(s): CHOL, HDL, LDLCALC, TRIG, CHOLHDL, LDLDIRECT in the last 72 hours. ------------------------------------------------------------------------------------------------------------------ No results for input(s): TSH, T4TOTAL, T3FREE, THYROIDAB in the last 72 hours.  Invalid input(s): FREET3 ------------------------------------------------------------------------------------------------------------------ No results for input(s): VITAMINB12, FOLATE, FERRITIN, TIBC, IRON, RETICCTPCT in the last 72 hours.  Coagulation profile  Recent Labs Lab 03/26/15 1633  INR 1.25     Recent Labs  03/26/15 1830  DDIMER 1.35*    Cardiac Enzymes  Recent Labs Lab 03/26/15 2125 03/27/15 0245 03/27/15 0855  TROPONINI <0.03 <0.03 0.05*   ------------------------------------------------------------------------------------------------------------------ Invalid input(s): POCBNP   Recent Labs  03/26/15 2208 03/27/15 0258 03/27/15 0739  GLUCAP 160* 107* 91     RAI,RIPUDEEP M.D. [facility] 03/27/2015, 10:38 AM  Pager: [phone] Between 7am to 7pm - call Pager - [phone]  After 7pm go to [email or web address] - password [email or web address]  Call night coverage person covering after 7pm

## 2015-03-27 NOTE — Progress Notes (Signed)
Called in to pt's room to have her BS checked. "I feel like it's dropping. CBG 107. Snack provided.

## 2015-03-27 NOTE — Progress Notes (Addendum)
Pt c/o  Nausea,blurred and double vision. Monitor shows Afib with frequent PVCs  Bigeminy. Benedetto Coons paged and made aware. New orders for STAT labs received.

## 2015-03-27 NOTE — Progress Notes (Signed)
T.Callaham made aware of several attempts to insert foley without success. IHave assisted pt OOB to [facility] with good results. Strict I&O being maintained.

## 2015-03-27 NOTE — Progress Notes (Signed)
Lenny Pastel NP made aware of lab results. To administer her HS dose of K as ordered. Also made aware of CBG's dropping throughout last night and this am after receiving HS dose of Levemeir  100 U. To receive 50Units Levemeir now.

## 2015-03-28 LAB — BASIC METABOLIC PANEL
Anion gap: 10 (ref 5–15)
BUN: 19 mg/dL (ref 6–20)
CO2: 35 mmol/L — ABNORMAL HIGH (ref 22–32)
CREATININE: 1.2 mg/dL — AB (ref 0.44–1.00)
Calcium: 8.7 mg/dL — ABNORMAL LOW (ref 8.9–10.3)
Chloride: 90 mmol/L — ABNORMAL LOW (ref 101–111)
GFR, EST NON AFRICAN AMERICAN: 52 mL/min — AB (ref >60)
Glucose, Bld: 126 mg/dL — ABNORMAL HIGH (ref 65–99)
Potassium: 3.7 mmol/L (ref 3.5–5.1)
SODIUM: 135 mmol/L (ref 135–145)

## 2015-03-28 LAB — GLUCOSE, CAPILLARY
GLUCOSE-CAPILLARY: 214 mg/dL — AB (ref 65–99)
Glucose-Capillary: 141 mg/dL — ABNORMAL HIGH (ref 65–99)
Glucose-Capillary: 110 mg/dL — ABNORMAL HIGH (ref 65–99)
Glucose-Capillary: 87 mg/dL (ref 65–99)
Glucose-Capillary: 141 mg/dL — ABNORMAL HIGH (ref 65–99)
Glucose-Capillary: 106 mg/dL — ABNORMAL HIGH (ref 65–99)

## 2015-03-28 MED ORDER — INSULIN ASPART 100 UNIT/ML ~~LOC~~ SOLN
0.0000 [IU] | Freq: Three times a day (TID) | SUBCUTANEOUS | Status: DC
  Administered 2015-03-28: 1 [IU] via SUBCUTANEOUS
  Administered 2015-03-29: 2 [IU] via SUBCUTANEOUS
  Administered 2015-03-30: 1 [IU] via SUBCUTANEOUS
  Administered 2015-03-30: 3 [IU] via SUBCUTANEOUS
  Administered 2015-03-30: 1 [IU] via SUBCUTANEOUS
  Administered 2015-03-31: 3 [IU] via SUBCUTANEOUS
  Administered 2015-04-01: 2 [IU] via SUBCUTANEOUS
  Administered 2015-04-01 (×2): 1 [IU] via SUBCUTANEOUS
  Administered 2015-04-02: 3 [IU] via SUBCUTANEOUS
  Administered 2015-04-02: 2 [IU] via SUBCUTANEOUS
  Administered 2015-04-02: 1 [IU] via SUBCUTANEOUS
  Administered 2015-04-03 (×2): 3 [IU] via SUBCUTANEOUS
  Administered 2015-04-03 – 2015-04-05 (×5): 2 [IU] via SUBCUTANEOUS
  Administered 2015-04-05: 1 [IU] via SUBCUTANEOUS
  Administered 2015-04-05: 3 [IU] via SUBCUTANEOUS
  Administered 2015-04-06 (×2): 2 [IU] via SUBCUTANEOUS
  Administered 2015-04-07: 1 [IU] via SUBCUTANEOUS
  Administered 2015-04-08 (×2): 2 [IU] via SUBCUTANEOUS

## 2015-03-28 MED ORDER — INSULIN DETEMIR 100 UNIT/ML ~~LOC~~ SOLN
50.0000 [IU] | Freq: Every day | SUBCUTANEOUS | Status: DC
  Administered 2015-03-28 – 2015-03-29 (×2): 50 [IU] via SUBCUTANEOUS
  Filled 2015-03-28 (×2): qty 0.5

## 2015-03-28 MED ORDER — FUROSEMIDE 10 MG/ML IJ SOLN
80.0000 mg | Freq: Three times a day (TID) | INTRAMUSCULAR | Status: DC
  Administered 2015-03-28 – 2015-04-01 (×12): 80 mg via INTRAVENOUS
  Filled 2015-03-28 (×15): qty 8

## 2015-03-28 MED ORDER — INSULIN ASPART 100 UNIT/ML ~~LOC~~ SOLN: 0.0000 [IU] | Freq: Every day | SUBCUTANEOUS | Status: DC

## 2015-03-28 NOTE — Progress Notes (Signed)
[facility]                                                                              Patient Demographics  [name], is a 49 y.o. female, DOB - 03-Aug-1966, RUE:[id number]  Admit date - 03/26/2015   Admitting Physician [name], MD  Outpatient Primary MD for the patient is [name], MD  LOS - 1   Chief Complaint  Patient presents with  . Loss of Consciousness       Brief HPI   Patient is a 49 year old female with morbid obesity, obstructive sleep apnea on CPAP, nonischemic cardiomyopathy, chronic systolic and diastolic CHF, EF 14-78% with grade 3 diastolic dysfunction per echo in March 2016, GERD, diabetes mellitus on insulin presented to ED with syncopal episode and progressive worsening shortness of breath. History was obtained from the patient and her spouse in the room. Patient reported that she had noticed about 12 pounds weight gain in the last few weeks with abdominal distention and worsening shortness of breath, using 2-3 pillows to sleep. Patient reports that she had to reschedule her prior appointment with Dr. [name], and her next appointment was next week. Patient had called [facility] hotline nurse today regarding her weight gain when while talking to the nurse she experienced a syncopal episode and fell on the floor striking the left side of her head. Patient's husband came to the house in the few minutes and found her on the floor however she was alert and awake at that time. Patient reported chest soreness/pressure for last few days, constant, with no radiation, no nausea or vomiting or diaphoresis. She did have a feeling of dizziness and lightheadedness prior to the syncopal episode. EDP note reviewed, troponin 0.0, BNP elevated 549, creatinine 1.2, CBC unremarkable, BMET otherwise unremarkable except creatinine 1.2, BUN 14 Chest x-ray showed cardiomegaly with mild pulmonary vascular congestion.   Assessment & Plan     Syncope:  Unclear etiology, possible vasovagal episode or due to hypoglycemia - First 2 troponins negative, third elevated at 0.05 - 2-D echocardiogram pending, d-dimer elevated 1.35, CT angiogram of the chest negative for PE - Patient also in fluid overload, has chronic combined systolic and diastolic CHF, discussed with Dr. [name], will see in a.m. - Patient was placed on her home dose of insulin regimen, having hypoglycemia episodes overnight with dizziness, lightheadedness and diaphoresis. Patient now feels that she may have had hypoglycemia episode which led to the syncopal episode.  Active Problems:  Acute on chronic combined systolic and diastolic CHF : Patient reports being compliant to medication and diet - Negative balance of 4.36 L, increase Lasix 80mg  IV q8hrs, also received a metolazone x 1 on admission. Weight down from 276 to 273 lbs (dry weight <265 per pt) -  continue Coreg, digoxin, amiodarone, corlanor, losartan, Aldactone - Continue strict I's and O's and daily weights  Insulin-dependent diabetes mellitus: Uncontrolled with hypoglycemia episodes - A1c pending, decreased Levemir to 50 units daily (half dose of patient's home dose of 100 units), change sliding scale to sensitive    GERD (gastroesophageal reflux disease) - Continue Protonix   Essential hypertension -  Currently stable, continue Coreg, losartan, metolazone, Lasix  Atypical Chest pain: Improved - CTA chest negative for PE.  - 2-D echo pending  Morbid obesity - Patient counseled on diet and weight control  Obstructive sleep apnea - Placed on CPAP  Code Status: Full code  Family Communication: Discussed in detail with the patient, all imaging results, lab results explained to the patient    Disposition Plan: Follow cardiology recommendations  Time Spent in minutes   25 minutes  Procedures  CT angiogram chest,  2-D echo  Consults     DVT Prophylaxis  heparin  subcutaneous  Medications  Scheduled Meds: . allopurinol  300 mg Oral Daily  . amiodarone  100 mg Oral Daily  . aspirin EC  81 mg Oral Daily  . carvedilol  3.125 mg Oral BID WC  . digoxin  0.0625 mg Oral Daily  . DULoxetine  30 mg Oral Daily  . ferrous sulfate  325 mg Oral TID  . furosemide  80 mg Intravenous Q8H  . gabapentin  600 mg Oral QID  . heparin  5,000 Units Subcutaneous 3 times per day  . insulin aspart  0-5 Units Subcutaneous QHS  . insulin aspart  0-9 Units Subcutaneous TID WC  . insulin detemir  50 Units Subcutaneous QHS  . ivabradine  2.5 mg Oral BID WC  . Linaclotide  290 mcg Oral Daily  . losartan  12.5 mg Oral QPM  . magnesium oxide  400 mg Oral Daily  . neomycin-bacitracin-polymyxin   Topical BID  . pantoprazole  40 mg Oral Daily  . potassium chloride SA  40 mEq Oral BID  . sodium chloride  3 mL Intravenous Q12H  . spironolactone  25 mg Oral BID  . temazepam  30 mg Oral QHS  . vitamin E  400 Units Oral Daily   Continuous Infusions:  PRN Meds:.sodium chloride, acetaminophen, albuterol, cyclobenzaprine, ondansetron (ZOFRAN) IV, oxyCODONE-acetaminophen, sodium chloride   Antibiotics   Anti-infectives    None        Subjective:   [name] was seen and examined today. Overnight having hypoglycemia ewhat better today, no dizziness or lightheadedness or chest pain.  Patient denies abdominal pain, N/V/D/C, new weakness, numbess, tingling. No acute events overnight.  Shortness of breath is improving. Unable to place Foley yesterday.  Objective:   Blood pressure 134/100, pulse 73, temperature 97.8 F (36.6 C), temperature source Oral, resp. rate 24, height 5\' 5"  (1.651 m), weight 124.1 kg (273 lb 9.5 oz), last menstrual period 01/19/2014, SpO2 100 %.  Wt Readings from Last 3 Encounters:  03/28/15 124.1 kg (273 lb 9.5 oz)  03/26/15 127.177 kg (280 lb 6 oz)  03/12/15 124.739 kg (275 lb)     Intake/Output Summary (Last 24 hours) at 03/28/15  0946 Last data filed at 03/28/15 0837  Gross per 24 hour  Intake    823 ml  Output   2625 ml  Net  -1802 ml    Exam  General: Alert and oriented x 3, NAD  HEENT:  PERRLA, EOMI,  Neck: Supple, unable to assess JVD  CVS: S1 S2 clear, RRR  Respiratory: Decreased breath sounds at the bases  Abdomen: Morbidly obese Soft, nontender, nondistended, + bowel sounds  Ext: no cyanosis clubbing or edema  Neuro: no new neuro deficits  Skin: No rashes  Psych: Normal affect and demeanor, alert and oriented x3    Data Review   Micro Results No results found for this or any previous visit (from  the past 240 hour(s)).  Radiology Reports Dg Chest 2 View  03/26/2015   CLINICAL DATA:  Chest pain, syncope for 1 day  EXAM: CHEST  2 VIEW  COMPARISON:  01/20/2015  FINDINGS: There is bilateral mild interstitial thickening. There is no focal parenchymal opacity. There is no pleural effusion or pneumothorax. There is stable cardiomegaly. There is an AICD noted.  The osseous structures are unremarkable.  IMPRESSION: Cardiomegaly with mild pulmonary vascular congestion.   Electronically Signed   By: Elige Ko   On: 03/26/2015 17:32   Ct Head Wo Contrast  03/26/2015   CLINICAL DATA:  Syncope, fall from sitting position  EXAM: CT HEAD WITHOUT CONTRAST  TECHNIQUE: Contiguous axial images were obtained from the base of the skull through the vertex without intravenous contrast.  COMPARISON:  04/28/2010  FINDINGS: No skull fracture is noted. Paranasal sinuses and mastoid air cells are unremarkable. No intracranial hemorrhage, mass effect or midline shift. No acute cortical infarction. No mass lesion is noted on this unenhanced scan.  IMPRESSION: No acute intracranial abnormality.  No significant change.   Electronically Signed   By: Natasha Mead M.D.   On: 03/26/2015 17:17   Ct Angio Chest Pe W/cm &/or Wo Cm  03/27/2015   CLINICAL DATA:  Shortness of breath and elevated D-dimer  EXAM: CT ANGIOGRAPHY CHEST  WITH CONTRAST  TECHNIQUE: Multidetector CT imaging of the chest was performed using the standard protocol during bolus administration of intravenous contrast. Multiplanar CT image reconstructions and MIPs were obtained to evaluate the vascular anatomy.  CONTRAST:  80mL OMNIPAQUE IOHEXOL 350 MG/ML SOLN  COMPARISON:  None.  FINDINGS: The lungs are well aerated bilaterally. Minimal left basilar atelectasis is noted. No focal infiltrate or sizable effusion is noted. The thyroid is generally enlarged without focal mass lesion. No airway obstruction is seen.  The thoracic aorta shows no aneurysmal dilatation. The pulmonary artery is well visualized demonstrating a normal branching pattern. No findings to suggest pulmonary embolism are identified. No hilar or mediastinal adenopathy is noted. The visualized upper abdomen is within normal limits. The cardiac chambers appear mildly enlarged. No acute bony abnormality is seen.  Review of the MIP images confirms the above findings.  IMPRESSION: No evidence pulmonary emboli.  Minimal left basilar atelectasis.  No other focal abnormality is seen.   Electronically Signed   By: Alcide Clever M.D.   On: 03/27/2015 17:21    CBC  Recent Labs Lab 03/26/15 1633  WBC 5.7  HGB 14.4  HCT 44.3  PLT 231  MCV 93.9  MCH 30.5  MCHC 32.5  RDW 16.0*    Chemistries   Recent Labs Lab 03/26/15 1633 03/27/15 0245 03/27/15 2223 03/28/15 0415  NA 138 137 136 135  K 3.7 4.1 4.4 3.7  CL 98* 96* 91* 90*  CO2 29 31 34* 35*  GLUCOSE 180* 107* 154* 126*  BUN CREATININE 1.20* 1.17* 1.34* 1.20*  CALCIUM 9.0 8.9 9.1 8.7*  MG 1.8  --  2.0  --   AST 32  --   --   --   ALT 23  --   --   --   ALKPHOS 102  --   --   --   BILITOT 0.9  --   --   --    ------------------------------------------------------------------------------------------------------------------ estimated creatinine clearance is 75 mL/min (by C-G formula based on Cr of  1.2). ------------------------------------------------------------------------------------------------------------------ No results for input(s): HGBA1C in the last 72 hours. ------------------------------------------------------------------------------------------------------------------  No results for input(s): CHOL, HDL, LDLCALC, TRIG, CHOLHDL, LDLDIRECT in the last 72 hours. ------------------------------------------------------------------------------------------------------------------ No results for input(s): TSH, T4TOTAL, T3FREE, THYROIDAB in the last 72 hours.  Invalid input(s): FREET3 ------------------------------------------------------------------------------------------------------------------ No results for input(s): VITAMINB12, FOLATE, FERRITIN, TIBC, IRON, RETICCTPCT in the last 72 hours.  Coagulation profile  Recent Labs Lab 03/26/15 1633  INR 1.25     Recent Labs  03/26/15 1830  DDIMER 1.35*    Cardiac Enzymes  Recent Labs Lab 03/26/15 2125 03/27/15 0245 03/27/15 0855  TROPONINI <0.03 <0.03 0.05*   ------------------------------------------------------------------------------------------------------------------ Invalid input(s): POCBNP   Recent Labs  03/27/15 0739 03/27/15 1117 03/27/15 1627 03/27/15 2113 03/28/15 0534 03/28/15 0736  GLUCAP 91 104* 126* 138* 141* 110*     RAI,RIPUDEEP M.D. [facility] 03/28/2015, 9:46 AM  Pager: [phone] Between 7am to 7pm - call Pager - [phone]  After 7pm go to [email or web address] - password [email or web address]  Call night coverage person covering after 7pm

## 2015-03-29 ENCOUNTER — Inpatient Hospital Stay (HOSPITAL_COMMUNITY): Payer: Medicare Other

## 2015-03-29 DIAGNOSIS — R0600 Dyspnea, unspecified: Secondary | ICD-10-CM

## 2015-03-29 LAB — BASIC METABOLIC PANEL WITH GFR
Anion gap: 11 (ref 5–15)
BUN: 24 mg/dL — ABNORMAL HIGH (ref 6–20)
CO2: 34 mmol/L — ABNORMAL HIGH (ref 22–32)
Calcium: 8.4 mg/dL — ABNORMAL LOW (ref 8.9–10.3)
Chloride: 90 mmol/L — ABNORMAL LOW (ref 101–111)
Creatinine, Ser: 1.38 mg/dL — ABNORMAL HIGH (ref 0.44–1.00)
GFR calc Af Amer: 51 mL/min — ABNORMAL LOW
GFR calc non Af Amer: 44 mL/min — ABNORMAL LOW
Glucose, Bld: 159 mg/dL — ABNORMAL HIGH (ref 65–99)
Potassium: 3.8 mmol/L (ref 3.5–5.1)
Sodium: 135 mmol/L (ref 135–145)

## 2015-03-29 LAB — HEMOGLOBIN A1C
Hgb A1c MFr Bld: 7.5 % — ABNORMAL HIGH (ref 4.8–5.6)
MEAN PLASMA GLUCOSE: 169 mg/dL

## 2015-03-29 LAB — GLUCOSE, CAPILLARY
GLUCOSE-CAPILLARY: 122 mg/dL — AB (ref 65–99)
GLUCOSE-CAPILLARY: 170 mg/dL — AB (ref 65–99)
GLUCOSE-CAPILLARY: 177 mg/dL — AB (ref 65–99)
Glucose-Capillary: 135 mg/dL — ABNORMAL HIGH (ref 65–99)
Glucose-Capillary: 161 mg/dL — ABNORMAL HIGH (ref 65–99)

## 2015-03-29 NOTE — Consult Note (Signed)
Advanced Heart Failure Team Consult Note  Referring Physician:  Rai Primary Physician: [name]  Reason for Consultation:  Acute on chronic combined HF  HPI:    [name] is a 49 y.o. 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   She presented to [facility] on 03/26/15 with c/o chest pressure and a syncopal episode.   The chest pressure had been ongoing constantly for several days and she also had a "12 lb weight gain" by her home scale over the past month.  She also had abdominal distension, worsening SOB, 3 pillow orthopnea (chronic).  She was on the phone with the HF clinic when she had her syncopal episode.  She was in the bathroom and struck her head on the floor when she fell. Her husband arrived several minutes later and found her awake and alert, she is unsure how long she was passed out.  She denied radiation of her CP, N/V, or diaphoresis.  She was dizzy/lightheaded prior to the syncopal episode.    Pertinent labs from ED included troponin 0.0, BNP 549, Cr 1.2. CXR in ED showed cardiomegaly with mild pulmonary vascular congestion.   She says her breathing is about the same since her admission to the hospital, and it started getting worse around July 4th. She denies any fluid or dietary indiscretion, and states she was taking all of her medications as directed.  She denies any acute illness or fever, but states she did feel bad with SOB and stay in the bed for 3 days prior to trying to call the HF clinic.   She states prior to July 4 she could walk around her house and go to church without SOB, but as her symptoms worsened she could not make it to the bathroom without becoming SOB (20-30 ft).  She states she has had two further episodes of chest pain since she has been in the hospital and  associates them with anxiety.  She states her "sugar or potassium" may have been low each time as well. No CP currently.     RHC 01/06/15 RA mean 18 RV 66/22 PA 66/34, mean 48 PCWP mean 28 Oxygen saturations: PA 55% AO 93% Cardiac Output (Fick) 3.77  Cardiac Index (Fick) 1.8 PVR 5.3 WU Cardiac Output (Thermo) 4.77 Cardiac Index (Thermo) 2.26 PVR 4.2 WU  CPX 6/15 Peak VO2: 9.3 (52.9% predicted peak VO2) - corrects to 17.1 ml/kg/min when adjusted to ideal body weight VE/VCO2 slope: 29.3 OUES: 1.57 Peak RER: 0.95 Ventilatory Threshold: 7.7 (43.8% predicted peak VO2) VE/MVV: 76.2% PETCO2 at peak: 41 O2pulse: 10  (77% predicted O2pulse) Thepatient's overall functional limitation is due primarily to her obesity and restrictive lung physiology. There also appears to be a mild circulatory component.   Review of Systems: [y] = yes, [ ]  = no   General: Weight gain [y]; Weight loss [ ] ; Anorexia [ ] ; Fatigue [y]; Fever [ ] ; Chills [ ] ; Weakness Cove.Etienne ]  Cardiac: Chest pain/pressure [y]; Resting SOB [ ] ; Exertional SOB [y]; Orthopnea [ ] ; Pedal Edema [ ] ; Palpitations [ ] ; Syncope [y]; Presyncope [ ] ; Paroxysmal nocturnal dyspnea[ ]   Pulmonary: Cough [ ] ; Wheezing[ ] ; Hemoptysis[ ] ; Sputum [ ] ; Snoring Cove.Etienne ]  GI: Vomiting[ ] ; Dysphagia[ ] ; Melena[ ] ; Hematochezia [ ] ;  Heartburn[ ] ; Abdominal pain [ ] ; Constipation [ ] ; Diarrhea [ ] ; BRBPR [ ]   GU: Hematuria[ ] ; Dysuria [ ] ; Nocturia[ ]   Vascular: Pain in legs with walking [ ] ; Pain in feet with lying flat [ ] ; Non-healing sores [ ] ; Stroke [ ] ; TIA [ ] ; Slurred speech [ ] ;  Neuro: Headaches[ ] ; Vertigo[ ] ; Seizures[ ] ; Paresthesias[ ] ;Blurred vision [ ] ; Diplopia [ ] ; Vision changes [ ]   Ortho/Skin: Arthritis Cove.Etienne ]; Joint pain Cove.Etienne ]; Muscle pain [ ] ; Joint swelling [ y]; Back Pain [ ] ; Rash [ ]   Psych: Depression[ ] ; Anxiety[ ]   Heme: Bleeding problems [ ] ; Clotting disorders [ ] ; Anemia [ ]   Endocrine: Diabetes [ y]; Thyroid  dysfunction[ ]   Home Medications Prior to Admission medications   Medication Sig Start Date End Date Taking? Authorizing Provider  albuterol (PROVENTIL HFA;VENTOLIN HFA) 108 (90 BASE) MCG/ACT inhaler Inhale 2 puffs into the lungs every 6 (six) hours as needed for wheezing or shortness of breath.   Yes Historical Provider, MD  allopurinol (ZYLOPRIM) 300 MG tablet Take 1 tablet (300 mg total) by mouth daily. 08/14/14  Yes [name] D Clegg, NP  amiodarone (PACERONE) 200 MG tablet Take 0.5 tablets (100 mg total) by mouth daily. 01/11/15  Yes Abelino Derrick, PA-C  aspirin EC 81 MG EC tablet Take 1 tablet (81 mg total) by mouth daily. 12/16/13  Yes Aundria Rud, NP  carvedilol (COREG) 6.25 MG tablet Take 0.5 tablets (3.125 mg total) by mouth 2 (two) times daily with a meal. 01/26/15  Yes [name] D Clegg, NP  CORLANOR 5 MG TABS tablet TAKE (1/2) TABLET BY MOUTH TWICE DAILY WITH A MEAL. 03/19/15  Yes Dolores Patty, MD  cyclobenzaprine (FLEXERIL) 10 MG tablet Take 10 mg by mouth 3 (three) times daily as needed for muscle spasms.    Yes Historical Provider, MD  digoxin (LANOXIN) 0.125 MG tablet Take 0.5 tablets (0.0625 mg total) by mouth daily. 01/01/15  Yes [name] D Clegg, NP  DULoxetine (CYMBALTA) 30 MG capsule Take 30 mg by mouth daily. 02/24/15  Yes Historical Provider, MD  ferrous sulfate 325 (65 FE) MG tablet Take 325 mg by mouth 3 (three) times daily.   Yes Historical Provider, MD  gabapentin (NEURONTIN) 300 MG capsule Take 600 mg by mouth 4 (four) times daily.   Yes Historical Provider, MD  insulin aspart (NOVOLOG) 100 UNIT/ML injection Inject 0-20 Units into the skin 3 (three) times daily with meals. Patient taking differently: Inject 0-20 Units into the skin 3 (three) times daily with meals. Per sliding scale: 120 - 150 = 2 units, 151 - 200 = 4 units, 201 - 250 = 7 units, 251 - 300 = 11 units, 301 - 350 = 15 units, 351 and above = 20 units 06/28/12  Yes Gareth Morgan, MD  insulin detemir (LEVEMIR) 100 UNIT/ML  injection Inject 100 Units into the skin at bedtime.    Yes Historical Provider, MD  Linaclotide (LINZESS) 290 MCG CAPS capsule Take 1 capsule (290 mcg total) by mouth daily. 30 minutes before breakfast 02/12/15  Yes Nira Retort, NP  losartan (COZAAR) 25 MG tablet Take 0.5 tablets (12.5 mg total) by mouth every evening. 02/02/15  Yes Laurey Morale, MD  magnesium oxide (MAG-OX) 400 (241.3 MG) MG tablet Take 1 tablet (400 mg total) by mouth daily. 01/01/15  Yes [name] D Clegg, NP  omeprazole (PRILOSEC) 40 MG capsule Take 1 capsule (40 mg total) by mouth daily.  03/27/13  Yes Marinus Maw, MD  oxyCODONE-acetaminophen (PERCOCET) 10-325 MG per tablet Take 0.5-1 tablets by mouth every 4 (four) hours as needed for pain.    Yes Historical Provider, MD  potassium chloride SA (K-DUR,KLOR-CON) 20 MEQ tablet Take 80 MEQ (4 tablets) am and 60 MEQ (3 tablets) pm 02/02/15  Yes Laurey Morale, MD  spironolactone (ALDACTONE) 25 MG tablet Take 1 tablet (25 mg total) by mouth 2 (two) times daily. 01/01/15  Yes [name] D Clegg, NP  temazepam (RESTORIL) 30 MG capsule Take 30 mg by mouth at bedtime.   Yes Historical Provider, MD  torsemide (DEMADEX) 100 MG tablet Take 1 tablet (100 mg total) by mouth 2 (two) times daily. 02/02/15  Yes Laurey Morale, MD  vitamin E 400 UNIT capsule Take 400 Units by mouth daily.   Yes Historical Provider, MD  allopurinol (ZYLOPRIM) 300 MG tablet TAKE 1 TABLET BY MOUTH ONCE DAILY. Patient not taking: Reported on 03/26/2015 03/05/15   [name] D Clegg, NP  metolazone (ZAROXOLYN) 2.5 MG tablet Take 1 tablet (2.5 mg total) by mouth once a week. Take one tablet by mouth 30 minutes before taking morning dose of Torsemide on Tuesdays Patient not taking: Reported on 03/01/2015 01/11/15   Abelino Derrick, PA-C  phenazopyridine (PYRIDIUM) 200 MG tablet Take 1 tablet (200 mg total) by mouth 3 (three) times daily. Patient not taking: Reported on 03/01/2015 01/20/15   Azalia Bilis, MD    Past Medical History: Past Medical  History  Diagnosis Date  . CHF (congestive heart failure)     a. EF 30-35%, RV mildly dilated (difficult study 11/2013) b. RHC (12/2013): RA 38/37 (35), RV 90/28, PA 95/51 (71), PCWP 54, PA 40%, AO 96 %, CO/CI Fick 3.2/1.4, PVR 5.3   . Nonischemic cardiomyopathy 12/2013    a. LHC (12/2013) normal coronary anatomy   . Mitral regurgitation     moderate to severe  . LBBB (left bundle branch block)   . HTN (hypertension)   . Asthma   . IBS (irritable bowel syndrome)     with primarily constipation  . Morbid obesity   . GERD (gastroesophageal reflux disease)   . IDA (iron deficiency anemia)     2o TO SB AVMS, Hx parenteral iron Dr Mariel Sleet  . AVM (arteriovenous malformation)     Small bowel, s/p duble balloon enteroscopy/APC jejunum Dr Gwinda Passe [facility]) 01/23/2011  . Peripheral neuropathy   . OSA (obstructive sleep apnea)     on CPAP qhs  . Diabetes mellitus without complication   . Torsades de pointes     a. appropriate ICD therapy 12/2014 in setting of hypokalemia  . Pneumonia     Past Surgical History: Past Surgical History  Procedure Laterality Date  . Appendectomy    . Cholecystectomy      biliary dyskinesia  . Mastectomy  2003    left, partial  . Knee arthroscopy  2005    left  . Small bowel enteroscopy  MAY 2012 [facility] DR. GILLIAM    SB AVMS s/p APC  . Colonoscopy  OCT 2010/SEP 2011    tortuous colon, 3 polyps-benign(2010),   . Upper gastrointestinal endoscopy  '08, '10, SEP 2011    mild antral gastritis (2010), negative SB bx (2010), incomlpete Schatzki's ring (2011)  . Small bowel enteroscopy  SEP 2011 [facility]    Fields-NO AVMS  . Givens capsule study  NOV 2010     NO AVMS, normal  . Irrigation and  debridement abscess  06/25/2012    Procedure: MINOR INCISION AND DRAINAGE OF ABSCESS;  Surgeon: [name], MD;  Location: [facility] ORS;  Service: General;  Laterality: [name];  Incision & Drainage of Infected Sebaceous Cyst on Chest  . Breast surgery    . Implantable  cardioverter defibrillator generator change Left 02/15/2012    BSX CRTD   . Left and right heart catheterization with coronary/graft angiogram  12/12/2013    Procedure: LEFT AND RIGHT HEART CATHETERIZATION WITH Isabel Caprice;  Surgeon: Peter M Swaziland, MD;  Location: [facility] CATH LAB;  Service: Cardiovascular;;  . Right heart catheterization [name] 01/06/2015    Procedure: RIGHT HEART CATH;  Surgeon: Laurey Morale, MD;  Location: [facility] CATH LAB;  Service: Cardiovascular;  Laterality: [name];  . Esophagogastroduodenoscopy  Sept 2013    [facility]: diminutive duodenal polyp, polyp in gastric cardia, path with benign duodenal mucosa with focally prominent Brunner's glands. Fundic gland polyps.     Family History: Family History  Problem Relation Age of Onset  . Colon cancer Paternal Uncle   . Cervical cancer Mother   . Hypertension Mother   . Cancer Mother   . Heart disease Father   . Hypertension Father   . GER disease Father   . Heart attack Father   . Bleeding Disorder Father   . Diabetes Sister   . Heart disease Sister   . Hypertension Sister     Social History: History   Social History  . Marital Status: Single    Spouse Name: [name]  . Number of Children: 1  . Years of Education: [name]   Occupational History  . disabled    Social History Main Topics  . Smoking status: Former Smoker -- 0.50 packs/day for 20 years    Types: Cigarettes    Quit date: 11/19/2009  . Smokeless tobacco: Former Neurosurgeon     Comment: Quit x 4 years  . Alcohol Use: No  . Drug Use: No  . Sexual Activity: Yes    Birth Control/ Protection: None   Other Topics Concern  . None   Social History Narrative   Disability for heart disease. Was a CNA.   Single- 1 daughter age 24.    Does not get regular exercise.      Allergies:  Allergies  Allergen Reactions  . Cymbalta [Duloxetine Hcl] Other (See Comments)    Only in a mild dose pt can tolerate  . Trazodone And Nefazodone Other (See Comments)     Nightmares   . Lyrica [Pregabalin] Swelling    Objective:    Vital Signs:   Temp:  [98.1 F (36.7 C)-98.9 F (37.2 C)] 98.9 F (37.2 C) (07/18 0822) Pulse Rate:  [69-78] 74 (07/18 0620) Resp:  [14-20] 18 (07/18 0822) BP: (116-142)/(75-103) 128/75 mmHg (07/18 0822) SpO2:  [96 %-100 %] 100 % (07/18 0822) Weight:  [275 lb 9.2 oz (125 kg)] 275 lb 9.2 oz (125 kg) (07/18 0300) Last BM Date: 03/24/15  Weight change: Filed Weights   03/27/15 0321 03/28/15 0520 03/29/15 0300  Weight: 276 lb 3.8 oz (125.3 kg) 273 lb 9.5 oz (124.1 kg) 275 lb 9.2 oz (125 kg)    Intake/Output:   Intake/Output Summary (Last 24 hours) at 03/29/15 0837 Last data filed at 03/29/15 4098  Gross per 24 hour  Intake    980 ml  Output   2250 ml  Net  -1270 ml     Physical Exam: General:  Obese. No resp difficulty HEENT: Abrasion  below left eye. Neck: supple. JVP 10-12. Carotids 2+ bilat; no bruits. No lymphadenopathy or thryomegaly noted.. Cor: PMI nondisplaced. Regular rate & rhythm. With frequent ectopy No rubs. 2/6 HSM LLSB,  ?S3 Lungs: Diminished bilaterally.  No wheezes or crackles appreciated. Abdomen: marked central obesity soft, nontender, mild distention. No hepatosplenomegaly. No bruits or masses. Good bowel sounds. Extremities: no cyanosis, clubbing, rash. 1+ edema Neuro: alert & orientedx3, cranial nerves grossly intact. moves all 4 extremities w/o difficulty. Affect flat  Telemetry: A sensed V paced in 70-80s with frequent PVCs ofetne bigmenial  Labs: Basic Metabolic Panel:  Recent Labs Lab 03/26/15 1633 03/27/15 0245 03/27/15 2223 03/28/15 0415 03/29/15 0322  NA 138 137 136 135 135  K 3.7 4.1 4.4 3.7 3.8  CL 98* 96* 91* 90* 90*  CO2 29 31 34* 35* 34*  GLUCOSE 180* 107* 154* 126* 159*  BUN 24*  CREATININE 1.20* 1.17* 1.34* 1.20* 1.38*  CALCIUM 9.0 8.9 9.1 8.7* 8.4*  MG 1.8  --  2.0  --   --     Liver Function Tests:  Recent Labs Lab 03/26/15 1633  AST 32   ALT 23  ALKPHOS 102  BILITOT 0.9  PROT 7.9  ALBUMIN 3.4*   No results for input(s): LIPASE, AMYLASE in the last 168 hours. No results for input(s): AMMONIA in the last 168 hours.  CBC:  Recent Labs Lab 03/26/15 1633  WBC 5.7  HGB 14.4  HCT 44.3  MCV 93.9  PLT 231    Cardiac Enzymes:  Recent Labs Lab 03/26/15 2125 03/27/15 0245 03/27/15 0855  TROPONINI <0.03 <0.03 0.05*    BNP: BNP (last 3 results)  Recent Labs  01/20/15 0040 01/26/15 1100 03/26/15 1633  BNP 166.0* 223.8* 549.5*    ProBNP (last 3 results)  Recent Labs  08/04/14 2339 08/21/14 1117  PROBNP 1727.0* 171.9*     CBG:  Recent Labs Lab 03/28/15 1159 03/28/15 1654 03/28/15 2013 03/28/15 2209 03/29/15 0738  GLUCAP 87 141* 106* 214* 122*    Coagulation Studies:  Recent Labs  03/26/15 1633  LABPROT 15.9*  INR 1.25    Other results: EKG: 7/16 : A sensed V paced. PVCs. HR 93.   Imaging: Ct Angio Chest Pe W/cm &/or Wo Cm  03/27/2015   CLINICAL DATA:  Shortness of breath and elevated D-dimer  EXAM: CT ANGIOGRAPHY CHEST WITH CONTRAST  TECHNIQUE: Multidetector CT imaging of the chest was performed using the standard protocol during bolus administration of intravenous contrast. Multiplanar CT image reconstructions and MIPs were obtained to evaluate the vascular anatomy.  CONTRAST:  80mL OMNIPAQUE IOHEXOL 350 MG/ML SOLN  COMPARISON:  None.  FINDINGS: The lungs are well aerated bilaterally. Minimal left basilar atelectasis is noted. No focal infiltrate or sizable effusion is noted. The thyroid is generally enlarged without focal mass lesion. No airway obstruction is seen.  The thoracic aorta shows no aneurysmal dilatation. The pulmonary artery is well visualized demonstrating a normal branching pattern. No findings to suggest pulmonary embolism are identified. No hilar or mediastinal adenopathy is noted. The visualized upper abdomen is within normal limits. The cardiac chambers appear mildly  enlarged. No acute bony abnormality is seen.  Review of the MIP images confirms the above findings.  IMPRESSION: No evidence pulmonary emboli.  Minimal left basilar atelectasis.  No other focal abnormality is seen.   Electronically Signed   By: Alcide Clever M.D.   On: 03/27/2015 17:21      Medications:  Current Medications: . allopurinol  300 mg Oral Daily  . amiodarone  100 mg Oral Daily  . aspirin EC  81 mg Oral Daily  . carvedilol  3.125 mg Oral BID WC  . digoxin  0.0625 mg Oral Daily  . DULoxetine  30 mg Oral Daily  . ferrous sulfate  325 mg Oral TID  . furosemide  80 mg Intravenous Q8H  . gabapentin  600 mg Oral QID  . heparin  5,000 Units Subcutaneous 3 times per day  . insulin aspart  0-5 Units Subcutaneous QHS  . insulin aspart  0-9 Units Subcutaneous TID WC  . insulin detemir  50 Units Subcutaneous QHS  . ivabradine  2.5 mg Oral BID WC  . Linaclotide  290 mcg Oral Daily  . losartan  12.5 mg Oral QPM  . magnesium oxide  400 mg Oral Daily  . neomycin-bacitracin-polymyxin   Topical BID  . pantoprazole  40 mg Oral Daily  . potassium chloride SA  40 mEq Oral BID  . sodium chloride  3 mL Intravenous Q12H  . spironolactone  25 mg Oral BID  . temazepam  30 mg Oral QHS  . vitamin E  400 Units Oral Daily     Infusions:      Assessment/Plan   1. 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 - On BB, ARB, ivabradine, spiro, and dig 2. Atypical CP - Trop negative. Normal cors on cath 4/15 3. Syncope 4. CKD Stage 3 5. GERD 6. DM2 7. OSA with CPAP 8. Morbid obesity 9. AKI 10. Frequent PVCs  IV Lasix increased to 80 mg q8 hrs yesterday. Has 5.6 L net output so far but 1 lb decrease in weight.  Will make sure pt is weighed standing up.  New Echo pending. Cyclic troponin without trend likely 2/2 acute HF.  Cr baseline appears to be between 1.1 - 1.3. Cr 1.38 Currently. K 3.8, will supp.  In the past she has not tolerated up-titration of medicines well 2/2  dizziness. May benefit from repeat RHC. CTA negative for PE.  Length of Stay: 2  [name] PA-C 03/29/2015, 8:37 AM  Advanced Heart Failure Team Pager [phone] (M-F; 7a - 4p)  Please contact [facility] for night-coverage after hours (4p -7a ) and weekends on [email or web address]  Patient seen and examined with [name], PA-C. We discussed all aspects of the encounter. I agree with the assessment and plan as stated above.   Very challenging situation. She has severe biventricular HF in the setting of marked obesity with BMI 46. Previous CPX test show majority of her limitation was related to obesity. However RHC in 4/16 showed elevated biventricular pressures with low cardiac output. Weight near baseline but appears volume overloaded on exam. Will continue IV lasix. If renal function continues to deteriorate will need RHC and possible inotropic support. Given size she is poor candidate for other advanced therapies. With syncope will need ICD interrogated in am. Having frequent PVCs. Will supp K. Mag ox on 7/16. Recheck in am.   Haidyn Chadderdon,MD 8:20 PM

## 2015-03-29 NOTE — Progress Notes (Signed)
CARDIAC REHAB PHASE I   PRE:  Rate/Rhythm: paced 70  BP:  Supine:   Sitting: 108/73  Standing:    SaO2: 99% 2L  MODE:  Ambulation: 140 ft   POST:  Rate/Rhythm: 81 paced  BP:  Supine:   Sitting: 101/35  Standing:    SaO2: 98% 2L 1450-1525 Pt walked 140 ft on 2L with rolling walker,gait belt use, and asst x 2. Had student nurse follow with recliner. Pt sat and rested at 70 ft.  Pt very sleepy. Put on BSC in room but pt unable to void. To recliner with call bell in room. Discussed with pt not to get up without assistance. Pt seems a little foggy, trying to pick something off floor instead of sitting. Discussed risk of fall. Discussed with RN that may want to check sugar. Will keep as asst x 2.   Luetta Nutting, RN BSN  03/29/2015 3:23 PM

## 2015-03-29 NOTE — Progress Notes (Signed)
  Echocardiogram 2D Echocardiogram has been performed.  [name] 03/29/2015, 11:51 AM

## 2015-03-29 NOTE — Care Management Important Message (Signed)
Important Message  Patient Details  Name: [name] MRN: [id number] Date of Birth: 06-Aug-1966   Medicare Important Message Given:  Yes-second notification given    [name] 03/29/2015, 3:06 PM

## 2015-03-29 NOTE — Progress Notes (Signed)
[facility]                                                                              Patient Demographics  [name], is a 49 y.o. female, DOB - March 21, 1966, ZOX:[id number]  Admit date - 03/26/2015   Admitting Physician Annaka Cleaver Jenna Luo, MD  Outpatient Primary MD for the patient is [name], MD  LOS - 2   Chief Complaint  Patient presents with  . Loss of Consciousness       Brief HPI   Patient is a 49 year old female with morbid obesity, obstructive sleep apnea on CPAP, nonischemic cardiomyopathy, chronic systolic and diastolic CHF, EF 81-19% with grade 3 diastolic dysfunction per echo in March 2016, GERD, diabetes mellitus on insulin presented to ED with syncopal episode and progressive worsening shortness of breath. History was obtained from the patient and her spouse in the room. Patient reported that she had noticed about 12 pounds weight gain in the last few weeks with abdominal distention and worsening shortness of breath, using 2-3 pillows to sleep. Patient reports that she had to reschedule her prior appointment with Dr. [name], and her next appointment was next week. Patient had called [facility] hotline nurse today regarding her weight gain when while talking to the nurse she experienced a syncopal episode and fell on the floor striking the left side of her head. Patient's husband came to the house in the few minutes and found her on the floor however she was alert and awake at that time. Patient reported chest soreness/pressure for last few days, constant, with no radiation, no nausea or vomiting or diaphoresis. She did have a feeling of dizziness and lightheadedness prior to the syncopal episode. EDP note reviewed, troponin 0.0, BNP elevated 549, creatinine 1.2, CBC unremarkable, BMET otherwise unremarkable except creatinine 1.2, BUN 14 Chest x-ray showed cardiomegaly with mild pulmonary vascular congestion.   Assessment & Plan     Syncope:  Unclear etiology, possible vasovagal episode or most likely due to hypoglycemia - First 2 troponins negative, third elevated at 0.05 - 2-D echocardiogram pending, d-dimer elevated 1.35, CT angiogram of the chest negative for PE - Patient also in fluid overload, has chronic combined systolic and diastolic CHF, discussed with Dr. [name], will see patient today - Patient was placed on her home dose of insulin regimen, she had hypoglycemia episodes during hospitalization, hence Levemir decreased to half dose. Patient now feels that she may have had hypoglycemia episode which led to the syncopal episode.  Active Problems:  Acute on chronic combined systolic and diastolic CHF : Patient reports being compliant to medication and diet - Negative balance of 5.6 L, continue Lasix 80mg  IV q8hrs, also received a metolazone x 1 on admission. No significant weight change, (dry weight <265 per pt), awaiting Dr. Prescott Gum recommendations -  continue Coreg, digoxin, amiodarone, corlanor, losartan, Aldactone - Echo ordered on 7/16, still pending   Insulin-dependent diabetes mellitus: Uncontrolled with hypoglycemia episodes - A1c still pending, continue decreased Levemir 50 units daily (half dose of patient's home dose of 100 units), sensitive sliding scale    GERD (gastroesophageal reflux disease) - Continue Protonix  Essential hypertension - Currently stable, continue Coreg, losartan, metolazone, Lasix  Atypical Chest pain: Improved - CTA chest negative for PE.  - 2-D echo pending  Morbid obesity - Patient counseled on diet and weight control  Obstructive sleep apnea - Placed on CPAP  Code Status: Full code  Family Communication: Discussed in detail with the patient, all imaging results, lab results explained to the patient and her husband at the bedside   Disposition Plan: Follow cardiology recommendations  Time Spent in minutes   25 minutes  Procedures  CT angiogram chest,  2-D  echo  Consults     DVT Prophylaxis  heparin subcutaneous  Medications  Scheduled Meds: . allopurinol  300 mg Oral Daily  . amiodarone  100 mg Oral Daily  . aspirin EC  81 mg Oral Daily  . carvedilol  3.125 mg Oral BID WC  . digoxin  0.0625 mg Oral Daily  . DULoxetine  30 mg Oral Daily  . ferrous sulfate  325 mg Oral TID  . furosemide  80 mg Intravenous Q8H  . gabapentin  600 mg Oral QID  . heparin  5,000 Units Subcutaneous 3 times per day  . insulin aspart  0-5 Units Subcutaneous QHS  . insulin aspart  0-9 Units Subcutaneous TID WC  . insulin detemir  50 Units Subcutaneous QHS  . ivabradine  2.5 mg Oral BID WC  . Linaclotide  290 mcg Oral Daily  . losartan  12.5 mg Oral QPM  . magnesium oxide  400 mg Oral Daily  . neomycin-bacitracin-polymyxin   Topical BID  . pantoprazole  40 mg Oral Daily  . potassium chloride SA  40 mEq Oral BID  . sodium chloride  3 mL Intravenous Q12H  . spironolactone  25 mg Oral BID  . temazepam  30 mg Oral QHS  . vitamin E  400 Units Oral Daily   Continuous Infusions:  PRN Meds:.sodium chloride, acetaminophen, albuterol, cyclobenzaprine, ondansetron (ZOFRAN) IV, oxyCODONE-acetaminophen, sodium chloride   Antibiotics   Anti-infectives    None        Subjective:   [name] was seen and examined today. No acute issues overnight, feeling a lot better, no hypoglycemia episodes overnight. No chest pain.  No dizziness or lightheadedness. Patient denies abdominal pain, N/V/D/C, new weakness, numbess, tingling. No acute events overnight.  Shortness of breath is improving.   Objective:   Blood pressure 128/75, pulse 74, temperature 98.9 F (37.2 C), temperature source Oral, resp. rate 18, height  (1.651 m), weight 125 kg (275 lb 9.2 oz), last menstrual period 01/19/2014, SpO2 100 %.  Wt Readings from Last 3 Encounters:  03/29/15 125 kg (275 lb 9.2 oz)  03/26/15 127.177 kg (280 lb 6 oz)  03/12/15 124.739 kg (275 lb)      Intake/Output Summary (Last 24 hours) at 03/29/15 0912 Last data filed at 03/29/15 1610  Gross per 24 hour  Intake    980 ml  Output   2250 ml  Net  -1270 ml    Exam  General: Alert and oriented x 3, NAD  HEENT:  PERRLA, EOMI,  Neck: Supple, unable to assess JVD  CVS: S1 S2 clear, RRR  Respiratory: Decreased breath sounds at the bases  Abdomen: Morbidly obese soft NT, ND, NBS  Ext: no cyanosis clubbing or edema  Neuro: no new neuro deficits  Skin: No rashes  Psych: Normal affect and demeanor, alert and oriented x3    Data Review   Micro Results No results  found for this or any previous visit (from the past 240 hour(s)).  Radiology Reports Dg Chest 2 View  03/26/2015   CLINICAL DATA:  Chest pain, syncope for 1 day  EXAM: CHEST  2 VIEW  COMPARISON:  01/20/2015  FINDINGS: There is bilateral mild interstitial thickening. There is no focal parenchymal opacity. There is no pleural effusion or pneumothorax. There is stable cardiomegaly. There is an AICD noted.  The osseous structures are unremarkable.  IMPRESSION: Cardiomegaly with mild pulmonary vascular congestion.   Electronically Signed   By: Elige Ko   On: 03/26/2015 17:32   Ct Head Wo Contrast  03/26/2015   CLINICAL DATA:  Syncope, fall from sitting position  EXAM: CT HEAD WITHOUT CONTRAST  TECHNIQUE: Contiguous axial images were obtained from the base of the skull through the vertex without intravenous contrast.  COMPARISON:  04/28/2010  FINDINGS: No skull fracture is noted. Paranasal sinuses and mastoid air cells are unremarkable. No intracranial hemorrhage, mass effect or midline shift. No acute cortical infarction. No mass lesion is noted on this unenhanced scan.  IMPRESSION: No acute intracranial abnormality.  No significant change.   Electronically Signed   By: Natasha Mead M.D.   On: 03/26/2015 17:17   Ct Angio Chest Pe W/cm &/or Wo Cm  03/27/2015   CLINICAL DATA:  Shortness of breath and elevated D-dimer   EXAM: CT ANGIOGRAPHY CHEST WITH CONTRAST  TECHNIQUE: Multidetector CT imaging of the chest was performed using the standard protocol during bolus administration of intravenous contrast. Multiplanar CT image reconstructions and MIPs were obtained to evaluate the vascular anatomy.  CONTRAST:  80mL OMNIPAQUE IOHEXOL 350 MG/ML SOLN  COMPARISON:  None.  FINDINGS: The lungs are well aerated bilaterally. Minimal left basilar atelectasis is noted. No focal infiltrate or sizable effusion is noted. The thyroid is generally enlarged without focal mass lesion. No airway obstruction is seen.  The thoracic aorta shows no aneurysmal dilatation. The pulmonary artery is well visualized demonstrating a normal branching pattern. No findings to suggest pulmonary embolism are identified. No hilar or mediastinal adenopathy is noted. The visualized upper abdomen is within normal limits. The cardiac chambers appear mildly enlarged. No acute bony abnormality is seen.  Review of the MIP images confirms the above findings.  IMPRESSION: No evidence pulmonary emboli.  Minimal left basilar atelectasis.  No other focal abnormality is seen.   Electronically Signed   By: Alcide Clever M.D.   On: 03/27/2015 17:21    CBC  Recent Labs Lab 03/26/15 1633  WBC 5.7  HGB 14.4  HCT 44.3  PLT 231  MCV 93.9  MCH 30.5  MCHC 32.5  RDW 16.0*    Chemistries   Recent Labs Lab 03/26/15 1633 03/27/15 0245 03/27/15 2223 03/28/15 0415 03/29/15 0322  NA 138 137 136 135 135  K 3.7 4.1 4.4 3.7 3.8  CL 98* 96* 91* 90* 90*  CO2 29 31 34* 35* 34*  GLUCOSE 180* 107* 154* 126* 159*  BUN 14 14 19 19  24*  CREATININE 1.20* 1.17* 1.34* 1.20* 1.38*  CALCIUM 9.0 8.9 9.1 8.7* 8.4*  MG 1.8  --  2.0  --   --   AST 32  --   --   --   --   ALT 23  --   --   --   --   ALKPHOS 102  --   --   --   --   BILITOT 0.9  --   --   --   --     ------------------------------------------------------------------------------------------------------------------  estimated creatinine clearance is 65.5 mL/min (by C-G formula based on Cr of 1.38). ------------------------------------------------------------------------------------------------------------------ No results for input(s): HGBA1C in the last 72 hours. ------------------------------------------------------------------------------------------------------------------ No results for input(s): CHOL, HDL, LDLCALC, TRIG, CHOLHDL, LDLDIRECT in the last 72 hours. ------------------------------------------------------------------------------------------------------------------ No results for input(s): TSH, T4TOTAL, T3FREE, THYROIDAB in the last 72 hours.  Invalid input(s): FREET3 ------------------------------------------------------------------------------------------------------------------ No results for input(s): VITAMINB12, FOLATE, FERRITIN, TIBC, IRON, RETICCTPCT in the last 72 hours.  Coagulation profile  Recent Labs Lab 03/26/15 1633  INR 1.25     Recent Labs  03/26/15 1830  DDIMER 1.35*    Cardiac Enzymes  Recent Labs Lab 03/26/15 2125 03/27/15 0245 03/27/15 0855  TROPONINI <0.03 <0.03 0.05*   ------------------------------------------------------------------------------------------------------------------ Invalid input(s): POCBNP   Recent Labs  03/28/15 0736 03/28/15 1159 03/28/15 1654 03/28/15 2013 03/28/15 2209 03/29/15 0738  GLUCAP 110* 87 141* 106* 214* 122*     Ramaya Guile M.D. [facility] 03/29/2015, 9:12 AM  Pager: [phone] Between 7am to 7pm - call Pager - [phone]  After 7pm go to [email or web address] - password [email or web address]  Call night coverage person covering after 7pm

## 2015-03-30 ENCOUNTER — Ambulatory Visit: Payer: Medicare Other | Admitting: *Deleted

## 2015-03-30 DIAGNOSIS — N183 Chronic kidney disease, stage 3 (moderate): Secondary | ICD-10-CM

## 2015-03-30 DIAGNOSIS — I5043 Acute on chronic combined systolic (congestive) and diastolic (congestive) heart failure: Secondary | ICD-10-CM

## 2015-03-30 DIAGNOSIS — R55 Syncope and collapse: Secondary | ICD-10-CM

## 2015-03-30 DIAGNOSIS — R079 Chest pain, unspecified: Secondary | ICD-10-CM

## 2015-03-30 LAB — BASIC METABOLIC PANEL
ANION GAP: 8 (ref 5–15)
BUN: 24 mg/dL — AB (ref 6–20)
CALCIUM: 8.5 mg/dL — AB (ref 8.9–10.3)
CHLORIDE: 88 mmol/L — AB (ref 101–111)
CO2: 36 mmol/L — AB (ref 22–32)
Creatinine, Ser: 1.18 mg/dL — ABNORMAL HIGH (ref 0.44–1.00)
GFR calc Af Amer: >60 mL/min (ref >60)
GFR calc non Af Amer: 53 mL/min — ABNORMAL LOW (ref >60)
GLUCOSE: 150 mg/dL — AB (ref 65–99)
POTASSIUM: 4 mmol/L (ref 3.5–5.1)
Sodium: 132 mmol/L — ABNORMAL LOW (ref 135–145)

## 2015-03-30 LAB — GLUCOSE, CAPILLARY
GLUCOSE-CAPILLARY: 135 mg/dL — AB (ref 65–99)
GLUCOSE-CAPILLARY: 130 mg/dL — AB (ref 65–99)
GLUCOSE-CAPILLARY: 184 mg/dL — AB (ref 65–99)
Glucose-Capillary: 217 mg/dL — ABNORMAL HIGH (ref 65–99)

## 2015-03-30 LAB — URINALYSIS, ROUTINE W REFLEX MICROSCOPIC
Bilirubin Urine: NEGATIVE
GLUCOSE, UA: NEGATIVE mg/dL
HGB URINE DIPSTICK: NEGATIVE
KETONES UR: NEGATIVE mg/dL
Leukocytes, UA: NEGATIVE
NITRITE: NEGATIVE
Protein, ur: NEGATIVE mg/dL
Specific Gravity, Urine: 1.006 (ref 1.005–1.030)
UROBILINOGEN UA: 1 mg/dL (ref 0.0–1.0)
pH: 6.5 (ref 5.0–8.0)

## 2015-03-30 LAB — MAGNESIUM: MAGNESIUM: 2 mg/dL (ref 1.7–2.4)

## 2015-03-30 MED ORDER — INSULIN DETEMIR 100 UNIT/ML ~~LOC~~ SOLN
60.0000 [IU] | Freq: Every day | SUBCUTANEOUS | Status: DC
  Administered 2015-03-30: 60 [IU] via SUBCUTANEOUS
  Filled 2015-03-30 (×2): qty 0.6

## 2015-03-30 MED ORDER — AMIODARONE HCL 200 MG PO TABS
400.0000 mg | ORAL_TABLET | Freq: Two times a day (BID) | ORAL | Status: DC
  Administered 2015-03-30 – 2015-04-04 (×10): 400 mg via ORAL
  Filled 2015-03-30 (×11): qty 2

## 2015-03-30 MED ORDER — POTASSIUM CHLORIDE CRYS ER 20 MEQ PO TBCR
40.0000 meq | EXTENDED_RELEASE_TABLET | Freq: Once | ORAL | Status: AC
  Administered 2015-03-30: 40 meq via ORAL
  Filled 2015-03-30: qty 2

## 2015-03-30 MED ORDER — METOLAZONE 5 MG PO TABS
5.0000 mg | ORAL_TABLET | Freq: Every day | ORAL | Status: DC
  Administered 2015-03-30 – 2015-04-03 (×5): 5 mg via ORAL
  Filled 2015-03-30 (×5): qty 1

## 2015-03-30 MED ORDER — DOCUSATE SODIUM 100 MG PO CAPS
200.0000 mg | ORAL_CAPSULE | Freq: Two times a day (BID) | ORAL | Status: DC
  Administered 2015-03-30 – 2015-04-08 (×18): 200 mg via ORAL
  Filled 2015-03-30 (×19): qty 2

## 2015-03-30 NOTE — Consult Note (Signed)
   [facility] CM Inpatient Consult   03/30/2015  [name] 11-15-1965 [id number]   Patient is active with [facility] Care Management services. Please see chart review tab in notes section in EPIC for [facility] RNCM patient outreach. Will continue to follow.   Raiford Noble, MSN-Ed, RN,BSN [facility] Liaison [phone]

## 2015-03-30 NOTE — Care Management Note (Signed)
Case Management Note  Patient Details  Name: [name] MRN: [id number] Date of Birth: 1965-11-23  Subjective/Objective:  Pt admitted for syncope. Pt is from home and plan is to return home. Pt has [facility] Services.                   Action/Plan: CM did discuss disposition needs with pt and she is refusing [facility] RN services @ this time. CM did make [facility] Liaison aware. No further needs identified by CM @ this time.    Expected Discharge Date:  03/29/15               Expected Discharge Plan:  Home/Self Care  In-House Referral:  NA  Discharge planning Services  CM Consult  Post Acute Care Choice:  NA Choice offered to:  NA  DME Arranged:  [name] DME Agency:  NA  HH Arranged:  NA HH Agency:  NA  Status of Service:  Completed, signed off  Medicare Important Message Given:  Yes-second notification given Date Medicare IM Given:    Medicare IM give by:    Date Additional Medicare IM Given:    Additional Medicare Important Message give by:     If discussed at Long Length of Stay Meetings, dates discussed:    Additional Comments:  Gala Lewandowsky, RN 03/30/2015, 2:23 PM

## 2015-03-30 NOTE — Consult Note (Signed)
   [facility] CM Inpatient Consult   03/30/2015  CALLYSTA STRICKLIN Jul 28, 1966 [id number]   Came to bedside to speak with patient and significant other. She was eating a [employer] cheeseburger upon arrival. Writer reiterated the importance of dietary control and sodium intake. Writer asked patient not to eat entire cheeseburger as she is currently being diuresed and dietary management is of importance. Patient reports she is happy with the services [facility] RNCM has been providing and declines needing any additional services such as home health follow up. Made her aware that [facility] will continue to follow her post hospital discharge. Made inpatient RNCM aware of visit.  Raiford Noble, MSN-Ed, RN,BSN [facility] Liaison [phone]

## 2015-03-30 NOTE — Progress Notes (Signed)
[facility]                                                                              Patient Demographics  [name], is a 49 y.o. female, DOB - 22-Oct-1965, ZOX:[id number]  Admit date - 03/26/2015   Admitting Physician Michal Strzelecki Jenna Luo, MD  Outpatient Primary MD for the patient is [name], MD  LOS - 3   Chief Complaint  Patient presents with  . Loss of Consciousness       Brief HPI   Patient is a 49 year old female with morbid obesity, obstructive sleep apnea on CPAP, nonischemic cardiomyopathy, chronic systolic and diastolic CHF, EF 81-19% with grade 3 diastolic dysfunction per echo in March 2016, GERD, diabetes mellitus on insulin presented to ED with syncopal episode and progressive worsening shortness of breath. History was obtained from the patient and her spouse in the room. Patient reported that she had noticed about 12 pounds weight gain in the last few weeks with abdominal distention and worsening shortness of breath, using 2-3 pillows to sleep. Patient reports that she had to reschedule her prior appointment with Dr. [name], and her next appointment was next week. Patient had called [facility] hotline nurse today regarding her weight gain when while talking to the nurse she experienced a syncopal episode and fell on the floor striking the left side of her head. Patient's husband came to the house in the few minutes and found her on the floor however she was alert and awake at that time. Patient reported chest soreness/pressure for last few days, constant, with no radiation, no nausea or vomiting or diaphoresis. She did have a feeling of dizziness and lightheadedness prior to the syncopal episode. EDP note reviewed, troponin 0.0, BNP elevated 549, creatinine 1.2, CBC unremarkable, BMET otherwise unremarkable except creatinine 1.2, BUN 14 Chest x-ray showed cardiomegaly with mild pulmonary vascular congestion.   Assessment & Plan     Syncope:  Unclear etiology, possible vasovagal episode or most likely due to hypoglycemia - First 2 troponins negative, third elevated at 0.05 - 2-D echo showed moderately to severely dilated LV but limited assessment of EF right systolic function also moderately to severely reduced. CT angiogram of the chest negative for PE - Patient was placed on her home dose of insulin regimen, she had hypoglycemia episodes during hospitalization, hence Levemir decreased to half dose. Patient now feels that she may have had hypoglycemia episode which led to the syncopal episode.  Active Problems:  Acute on chronic combined systolic and diastolic CHF : Patient reports being compliant to medication and diet - Negative balance of 6.53 L on Lasix  IV q8hrs, also received a metolazone x 1 on admission. No significant weight change, (dry weight <265 per pt), CHF team following -  continue Coreg, digoxin, amiodarone, corlanor, losartan, Aldactone - 2-D echo showed moderately to severely dilated LV but limited assessment of EF   Insulin-dependent diabetes mellitus: Uncontrolled with hypoglycemia episodes - Hemoglobin A1c 7.5.  - Blood sugars now in 160-170s, increase Levemir to 60 units daily ( patient's home dose of 100 units), sensitive sliding scale    GERD (gastroesophageal reflux  disease) - Continue Protonix   Essential hypertension - Currently stable, continue Coreg, losartan, metolazone, Lasix  Atypical Chest pain: Improved - CTA chest negative for PE. 2-D echo with moderately to severely dilated LV, cardiology following  Morbid obesity - Patient counseled on diet and weight control  Obstructive sleep apnea - Placed on CPAP  Code Status: Full code  Family Communication: Discussed in detail with the patient, all imaging results, lab results explained to the patient    Disposition Plan: Follow cardiology recommendations  Time Spent in minutes   25 minutes  Procedures  CT angiogram chest,  2-D  echo  Consults   Cardiology/CHF team  DVT Prophylaxis  heparin subcutaneous  Medications  Scheduled Meds: . allopurinol  300 mg Oral Daily  . amiodarone  100 mg Oral Daily  . aspirin EC  81 mg Oral Daily  . carvedilol  3.125 mg Oral BID WC  . digoxin  0.0625 mg Oral Daily  . DULoxetine  30 mg Oral Daily  . ferrous sulfate  325 mg Oral TID  . furosemide  80 mg Intravenous Q8H  . gabapentin  600 mg Oral QID  . heparin  5,000 Units Subcutaneous 3 times per day  . insulin aspart  0-5 Units Subcutaneous QHS  . insulin aspart  0-9 Units Subcutaneous TID WC  . insulin detemir  60 Units Subcutaneous QHS  . ivabradine  2.5 mg Oral BID WC  . Linaclotide  290 mcg Oral Daily  . losartan  12.5 mg Oral QPM  . magnesium oxide  400 mg Oral Daily  . neomycin-bacitracin-polymyxin   Topical BID  . pantoprazole  40 mg Oral Daily  . potassium chloride SA  40 mEq Oral BID  . sodium chloride  3 mL Intravenous Q12H  . spironolactone  25 mg Oral BID  . temazepam  30 mg Oral QHS  . vitamin E  400 Units Oral Daily   Continuous Infusions:  PRN Meds:.sodium chloride, acetaminophen, albuterol, cyclobenzaprine, ondansetron (ZOFRAN) IV, oxyCODONE-acetaminophen, sodium chloride   Antibiotics   Anti-infectives    None        Subjective:   [name] was seen and examined today. Denies any specific complaints, no chest pain, shortness of breath is improving No dizziness or lightheadedness. Patient denies abdominal pain, N/V/D/C, new weakness, numbess, tingling. No acute events overnight.  Afebrile  Objective:   Blood pressure 131/75, pulse 79, temperature 97.9 F (36.6 C), temperature source Oral, resp. rate 18, height 5\' 5"  (1.651 m), weight 126.1 kg (278 lb), last menstrual period 01/19/2014, SpO2 99 %.  Wt Readings from Last 3 Encounters:  03/30/15 126.1 kg (278 lb)  03/26/15 127.177 kg (280 lb 6 oz)  03/12/15 124.739 kg (275 lb)     Intake/Output Summary (Last 24 hours) at  03/30/15 1059 Last data filed at 03/30/15 1010  Gross per 24 hour  Intake   1184 ml  Output   1900 ml  Net   -716 ml    Exam  General: Alert and oriented x 3, NAD  HEENT:  PERRLA, EOMI,  Neck: Supple, unable to assess JVD  CVS: S1 S2 clear, RRR, 2/6 HSM  Respiratory: Diminished bilaterally  Abdomen: Morbidly obese soft NT, normal bowel sounds, + distended  Ext: no cyanosis clubbing, 1+ edema  Neuro: no new neuro deficits  Skin: No rashes  Psych: Normal affect and demeanor, alert and oriented x3    Data Review   Micro Results No results found for this or any  previous visit (from the past 240 hour(s)).  Radiology Reports Dg Chest 2 View  03/26/2015   CLINICAL DATA:  Chest pain, syncope for 1 day  EXAM: CHEST  2 VIEW  COMPARISON:  01/20/2015  FINDINGS: There is bilateral mild interstitial thickening. There is no focal parenchymal opacity. There is no pleural effusion or pneumothorax. There is stable cardiomegaly. There is an AICD noted.  The osseous structures are unremarkable.  IMPRESSION: Cardiomegaly with mild pulmonary vascular congestion.   Electronically Signed   By: Elige Ko   On: 03/26/2015 17:32   Ct Head Wo Contrast  03/26/2015   CLINICAL DATA:  Syncope, fall from sitting position  EXAM: CT HEAD WITHOUT CONTRAST  TECHNIQUE: Contiguous axial images were obtained from the base of the skull through the vertex without intravenous contrast.  COMPARISON:  04/28/2010  FINDINGS: No skull fracture is noted. Paranasal sinuses and mastoid air cells are unremarkable. No intracranial hemorrhage, mass effect or midline shift. No acute cortical infarction. No mass lesion is noted on this unenhanced scan.  IMPRESSION: No acute intracranial abnormality.  No significant change.   Electronically Signed   By: Natasha Mead M.D.   On: 03/26/2015 17:17   Ct Angio Chest Pe W/cm &/or Wo Cm  03/27/2015   CLINICAL DATA:  Shortness of breath and elevated D-dimer  EXAM: CT ANGIOGRAPHY CHEST  WITH CONTRAST  TECHNIQUE: Multidetector CT imaging of the chest was performed using the standard protocol during bolus administration of intravenous contrast. Multiplanar CT image reconstructions and MIPs were obtained to evaluate the vascular anatomy.  CONTRAST:  80mL OMNIPAQUE IOHEXOL 350 MG/ML SOLN  COMPARISON:  None.  FINDINGS: The lungs are well aerated bilaterally. Minimal left basilar atelectasis is noted. No focal infiltrate or sizable effusion is noted. The thyroid is generally enlarged without focal mass lesion. No airway obstruction is seen.  The thoracic aorta shows no aneurysmal dilatation. The pulmonary artery is well visualized demonstrating a normal branching pattern. No findings to suggest pulmonary embolism are identified. No hilar or mediastinal adenopathy is noted. The visualized upper abdomen is within normal limits. The cardiac chambers appear mildly enlarged. No acute bony abnormality is seen.  Review of the MIP images confirms the above findings.  IMPRESSION: No evidence pulmonary emboli.  Minimal left basilar atelectasis.  No other focal abnormality is seen.   Electronically Signed   By: Alcide Clever M.D.   On: 03/27/2015 17:21    CBC  Recent Labs Lab 03/26/15 1633  WBC 5.7  HGB 14.4  HCT 44.3  PLT 231  MCV 93.9  MCH 30.5  MCHC 32.5  RDW 16.0*    Chemistries   Recent Labs Lab 03/26/15 1633 03/27/15 0245 03/27/15 2223 03/28/15 0415 03/29/15 0322 03/30/15 0316  NA 138 137 136 135 135 132*  K 3.7 4.1 4.4 3.7 3.8 4.0  CL 98* 96* 91* 90* 90* 88*  CO2 29 31 34* 35* 34* 36*  GLUCOSE 180* 107* 154* 126* 159* 150*  BUN 14 14 19 19  24* 24*  CREATININE 1.20* 1.17* 1.34* 1.20* 1.38* 1.18*  CALCIUM 9.0 8.9 9.1 8.7* 8.4* 8.5*  MG 1.8  --  2.0  --   --  2.0  AST 32  --   --   --   --   --   ALT 23  --   --   --   --   --   ALKPHOS 102  --   --   --   --   --  BILITOT 0.9  --   --   --   --   --     ------------------------------------------------------------------------------------------------------------------ estimated creatinine clearance is 77 mL/min (by C-G formula based on Cr of 1.18). ------------------------------------------------------------------------------------------------------------------ No results for input(s): HGBA1C in the last 72 hours. ------------------------------------------------------------------------------------------------------------------ No results for input(s): CHOL, HDL, LDLCALC, TRIG, CHOLHDL, LDLDIRECT in the last 72 hours. ------------------------------------------------------------------------------------------------------------------ No results for input(s): TSH, T4TOTAL, T3FREE, THYROIDAB in the last 72 hours.  Invalid input(s): FREET3 ------------------------------------------------------------------------------------------------------------------ No results for input(s): VITAMINB12, FOLATE, FERRITIN, TIBC, IRON, RETICCTPCT in the last 72 hours.  Coagulation profile  Recent Labs Lab 03/26/15 1633  INR 1.25    No results for input(s): DDIMER in the last 72 hours.  Cardiac Enzymes  Recent Labs Lab 03/26/15 2125 03/27/15 0245 03/27/15 0855  TROPONINI <0.03 <0.03 0.05*   ------------------------------------------------------------------------------------------------------------------ Invalid input(s): POCBNP   Recent Labs  03/29/15 0738 03/29/15 1122 03/29/15 1528 03/29/15 1630 03/29/15 2046 03/30/15 0730  GLUCAP 122* 135* 161* 170* 177* 135*     Annalese Stiner M.D. [facility] 03/30/2015, 10:59 AM  Pager: [phone] Between 7am to 7pm - call Pager - [phone]  After 7pm go to [email or web address] - password [email or web address]  Call night coverage person covering after 7pm

## 2015-03-30 NOTE — Progress Notes (Addendum)
Advanced Heart Failure Rounding Note  PCP: Lorenza Evangelist Primary Cardiologist: [name]  Subjective:     Says her breathing feels better, denies any episodes of SOB since yesterday. States she is not peeing very much. Has strong urge to urinate but has to sit for a long time with only a small amount of urine production, bladder feels full. Says her belly still feels very tight. No CP or SOB at rest.  Still has occasional lightheadedness on sitting up, but not as bad.     Only out 0.5 L yesterday. Weight shows up 3 lbs. Poor output for 80 lasix IV q8hrs. Having frequent PVCs    Objective:   Weight Range: 278 lb (126.1 kg) Body mass index is 46.26 kg/(m^2).   Vital Signs:   Temp:  [97.7 F (36.5 C)-98.3 F (36.8 C)] 97.9 F (36.6 C) (07/19 0548) Pulse Rate:  [70-81] 71 (07/19 0548) Resp:  [18-19] 18 (07/19 0548) BP: (106-135)/(68-81) 131/75 mmHg (07/19 0839) SpO2:  [97 %-99 %] 99 % (07/19 0548) Weight:  [278 lb (126.1 kg)] 278 lb (126.1 kg) (07/19 0548) Last BM Date: 03/29/15  Weight change: Filed Weights   03/28/15 0520 03/29/15 0300 03/30/15 0548  Weight: 273 lb 9.5 oz (124.1 kg) 275 lb 9.2 oz (125 kg) 278 lb (126.1 kg)    Intake/Output:   Intake/Output Summary (Last 24 hours) at 03/30/15 0858 Last data filed at 03/30/15 0825  Gross per 24 hour  Intake   1304 ml  Output   1650 ml  Net   -346 ml     Physical Exam: General: Obese. No resp difficulty HEENT: Abrasion below left eye. Neck: supple. JVP 11-12 but difficult to assess 2/2 girth. Carotids 2+ bilat; no bruits. No lymphadenopathy or thryomegaly noted.. Cor: PMI nondisplaced. Regular rate & rhythm with frequent ectopy. No rubs. 2/6 HSM LLSB, ?S3 Lungs: Diminished bilaterally. No wheezes or crackles appreciated. Abdomen: marked central obesity, nontender, ++ distention. No hepatosplenomegaly. No bruits or masses. Good bowel sounds. Extremities: no cyanosis, clubbing, rash. 2+ edema to knees  bilaterally. Neuro: alert & orientedx3, cranial nerves grossly intact. moves all 4 extremities w/o difficulty. Affect flat   Telemetry: A sensed V paced in 70s.. Frequent PVCs, including bigeminy.  Labs: CBC No results for input(s): WBC, NEUTROABS, HGB, HCT, MCV, PLT in the last 72 hours. Basic Metabolic Panel  Recent Labs  03/27/15 2223  03/29/15 0322 03/30/15 0316  NA 136  < > 135 132*  K 4.4  < > 3.8 4.0  CL 91*  < > 90* 88*  CO2 34*  < > 34* 36*  GLUCOSE 154*  < > 159* 150*  BUN 19  < > 24* 24*  CALCIUM 9.1  < > 8.4* 8.5*  MG 2.0  --   --  2.0  < > = values in this interval not displayed. Liver Function Tests No results for input(s): AST, ALT, ALKPHOS, BILITOT, PROT, ALBUMIN in the last 72 hours. No results for input(s): LIPASE, AMYLASE in the last 72 hours. Cardiac Enzymes No results for input(s): CKTOTAL, CKMB, CKMBINDEX, TROPONINI in the last 72 hours.  BNP: BNP (last 3 results)  Recent Labs  01/20/15 0040 01/26/15 1100 03/26/15 1633  BNP 166.0* 223.8* 549.5*    ProBNP (last 3 results)  Recent Labs  08/04/14 2339 08/21/14 1117  PROBNP 1727.0* 171.9*     D-Dimer No results for input(s): DDIMER in the last 72 hours. Hemoglobin A1C No results for input(s): HGBA1C in the  last 72 hours. Fasting Lipid Panel No results for input(s): CHOL, HDL, LDLCALC, TRIG, CHOLHDL, LDLDIRECT in the last 72 hours. Thyroid Function Tests No results for input(s): TSH, T4TOTAL, T3FREE, THYROIDAB in the last 72 hours.  Invalid input(s): FREET3  Other results:     Imaging/Studies:   No results found.  Latest Echo  Latest Cath   Medications:     Scheduled Medications: . allopurinol  300 mg Oral Daily  . amiodarone  100 mg Oral Daily  . aspirin EC  81 mg Oral Daily  . carvedilol  3.125 mg Oral BID WC  . digoxin  0.0625 mg Oral Daily  . DULoxetine  30 mg Oral Daily  . ferrous sulfate  325 mg Oral TID  . furosemide  80 mg Intravenous Q8H  . gabapentin   600 mg Oral QID  . heparin  5,000 Units Subcutaneous 3 times per day  . insulin aspart  0-5 Units Subcutaneous QHS  . insulin aspart  0-9 Units Subcutaneous TID WC  . insulin detemir  60 Units Subcutaneous QHS  . ivabradine  2.5 mg Oral BID WC  . Linaclotide  290 mcg Oral Daily  . losartan  12.5 mg Oral QPM  . magnesium oxide  400 mg Oral Daily  . neomycin-bacitracin-polymyxin   Topical BID  . pantoprazole  40 mg Oral Daily  . potassium chloride SA  40 mEq Oral BID  . sodium chloride  3 mL Intravenous Q12H  . spironolactone  25 mg Oral BID  . temazepam  30 mg Oral QHS  . vitamin E  400 Units Oral Daily     Infusions:     PRN Medications:  sodium chloride, acetaminophen, albuterol, cyclobenzaprine, ondansetron (ZOFRAN) IV, oxyCODONE-acetaminophen, sodium chloride   Assessment/Plan   1. 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 - On BB, ARB, ivabradine, spiro, and dig - Continues to be overloaded 2. Atypical CP - Trop negative. Normal cors on cath 4/15 3. Syncope - ICD interrogation  4. CKD Stage 3- Improved 1.38 -> 1.18 5. GERD 6. DM2 7. OSA with CPAP 8. Morbid obesity 9. AKI 10. Frequent PVCs- K 4.0 Mg 2.0 this am. 11. Urinary retention - Would likely benefit from I/O cath vs foley. Will consult MD. 12. H/o VT in setting of hypokalemia 4/16  With poor UO ? Adding a dose of metolazone. If renal function deteriorates will need RHC and possible inotropic support. Given size she is poor candidate for other advanced therapies.   Length of Stay: 3   [name] PA-C 03/30/2015, 8:58 AM  Advanced Heart Failure Team Pager [phone] (M-F; 7a - 4p)  Please contact [facility] for night-coverage after hours (4p -7a ) and weekends on [email or web address]  Patient seen and examined with [name], PA-C. We discussed all aspects of the encounter. I agree with the assessment and plan as stated above.   ICD interrogation shows VF as caused of syncope. She was  resuscitated with ICD shock. Renal function better but poor response to IV lasix. Will add metolazone and watch response. She has advanced HF but options very limited given her size and comorbidities. Home inotropes less attractive with recent VF and ICD shock. Will increase amio to 400 bid. Keep K > = 4.0 mg > = 2.0. May need RHC.  Bensimhon, Daniel,MD 6:26 PM

## 2015-03-31 ENCOUNTER — Encounter (HOSPITAL_COMMUNITY)
Admission: EM | Disposition: A | Discharge status: Home Health | Payer: Medicare Other | Source: Home / Self Care | Attending: Internal Medicine

## 2015-03-31 DIAGNOSIS — I509 Heart failure, unspecified: Secondary | ICD-10-CM | POA: Insufficient documentation

## 2015-03-31 DIAGNOSIS — I4901 Ventricular fibrillation: Secondary | ICD-10-CM | POA: Insufficient documentation

## 2015-03-31 HISTORY — PX: CARDIAC CATHETERIZATION: SHX172

## 2015-03-31 LAB — CBC
HEMATOCRIT: 46 % (ref 36.0–46.0)
Hemoglobin: 15 g/dL (ref 12.0–15.0)
MCH: 30.5 pg (ref 26.0–34.0)
MCHC: 32.6 g/dL (ref 30.0–36.0)
MCV: 93.7 fL (ref 78.0–100.0)
Platelets: 239 10*3/uL (ref 150–400)
RBC: 4.91 MIL/uL (ref 3.87–5.11)
RDW: 15.8 % — ABNORMAL HIGH (ref 11.5–15.5)
WBC: 5.2 10*3/uL (ref 4.0–10.5)

## 2015-03-31 LAB — POCT I-STAT 3, VENOUS BLOOD GAS (G3P V)
ACID-BASE EXCESS: 10 mmol/L — AB (ref 0.0–2.0)
Acid-Base Excess: 9 mmol/L — ABNORMAL HIGH (ref 0.0–2.0)
BICARBONATE: 39.6 meq/L — AB (ref 20.0–24.0)
Bicarbonate: 39.4 mEq/L — ABNORMAL HIGH (ref 20.0–24.0)
O2 SAT: 45 %
O2 Saturation: 48 %
PCO2 VEN: 75.7 mmHg — AB (ref 45.0–50.0)
PCO2 VEN: 76.8 mmHg — AB (ref 45.0–50.0)
PH VEN: 7.326 — AB (ref 7.250–7.300)
PO2 VEN: 30 mmHg (ref 30.0–45.0)
TCO2: 42 mmol/L (ref 0–100)
TCO2: 42 mmol/L (ref 0–100)
pH, Ven: 7.318 — ABNORMAL HIGH (ref 7.250–7.300)
pO2, Ven: 28 mmHg — CL (ref 30.0–45.0)

## 2015-03-31 LAB — BASIC METABOLIC PANEL
ANION GAP: 13 (ref 5–15)
Anion gap: 12 (ref 5–15)
BUN: 31 mg/dL — ABNORMAL HIGH (ref 6–20)
BUN: 34 mg/dL — AB (ref 6–20)
CALCIUM: 9.1 mg/dL (ref 8.9–10.3)
CHLORIDE: 91 mmol/L — AB (ref 101–111)
CO2: 31 mmol/L (ref 22–32)
CO2: 32 mmol/L (ref 22–32)
Calcium: 9.1 mg/dL (ref 8.9–10.3)
Chloride: 90 mmol/L — ABNORMAL LOW (ref 101–111)
Creatinine, Ser: 1.43 mg/dL — ABNORMAL HIGH (ref 0.44–1.00)
Creatinine, Ser: 1.43 mg/dL — ABNORMAL HIGH (ref 0.44–1.00)
GFR calc Af Amer: 49 mL/min — ABNORMAL LOW (ref >60)
GFR calc non Af Amer: 42 mL/min — ABNORMAL LOW (ref >60)
GFR calc non Af Amer: 42 mL/min — ABNORMAL LOW (ref >60)
GFR, EST AFRICAN AMERICAN: 49 mL/min — AB (ref >60)
GLUCOSE: 156 mg/dL — AB (ref 65–99)
Glucose, Bld: 158 mg/dL — ABNORMAL HIGH (ref 65–99)
POTASSIUM: 5 mmol/L (ref 3.5–5.1)
Potassium: 4.1 mmol/L (ref 3.5–5.1)
SODIUM: 134 mmol/L — AB (ref 135–145)
Sodium: 135 mmol/L (ref 135–145)

## 2015-03-31 LAB — GLUCOSE, CAPILLARY
GLUCOSE-CAPILLARY: 166 mg/dL — AB (ref 65–99)
GLUCOSE-CAPILLARY: 166 mg/dL — AB (ref 65–99)
Glucose-Capillary: 216 mg/dL — ABNORMAL HIGH (ref 65–99)
Glucose-Capillary: 166 mg/dL — ABNORMAL HIGH (ref 65–99)
Glucose-Capillary: 113 mg/dL — ABNORMAL HIGH (ref 65–99)

## 2015-03-31 LAB — PROTIME-INR
INR: 1.25 (ref 0.00–1.49)
PROTHROMBIN TIME: 15.9 s — AB (ref 11.6–15.2)

## 2015-03-31 LAB — MAGNESIUM: Magnesium: 2.2 mg/dL (ref 1.7–2.4)

## 2015-03-31 SURGERY — RIGHT HEART CATH

## 2015-03-31 MED ORDER — SODIUM CHLORIDE 0.9 % IV SOLN: 250.0000 mL | INTRAVENOUS | Status: DC | PRN

## 2015-03-31 MED ORDER — SODIUM CHLORIDE 0.9 % IJ SOLN
3.0000 mL | Freq: Two times a day (BID) | INTRAMUSCULAR | Status: DC
  Administered 2015-04-01 – 2015-04-03 (×4): 3 mL via INTRAVENOUS

## 2015-03-31 MED ORDER — ACETAMINOPHEN 325 MG PO TABS
650.0000 mg | ORAL_TABLET | ORAL | Status: DC | PRN
Start: 2015-03-31 — End: 2015-04-08

## 2015-03-31 MED ORDER — SODIUM CHLORIDE 0.9 % IJ SOLN: 3.0000 mL | INTRAMUSCULAR | Status: DC | PRN

## 2015-03-31 MED ORDER — HEPARIN (PORCINE) IN NACL 2-0.9 UNIT/ML-% IJ SOLN
INTRAMUSCULAR | Status: AC
  Filled 2015-03-31: qty 500

## 2015-03-31 MED ORDER — MILRINONE IN DEXTROSE 20 MG/100ML IV SOLN
0.3750 ug/kg/min | INTRAVENOUS | Status: DC
  Administered 2015-03-31 – 2015-04-01 (×4): 0.25 ug/kg/min via INTRAVENOUS
  Administered 2015-04-02: 0.375 ug/kg/min via INTRAVENOUS
  Administered 2015-04-02: 0.25 ug/kg/min via INTRAVENOUS
  Administered 2015-04-03 – 2015-04-08 (×19): 0.375 ug/kg/min via INTRAVENOUS
  Filled 2015-03-31 (×25): qty 100

## 2015-03-31 MED ORDER — ONDANSETRON HCL 4 MG/2ML IJ SOLN: 4.0000 mg | Freq: Four times a day (QID) | INTRAMUSCULAR | Status: DC | PRN

## 2015-03-31 MED ORDER — LIDOCAINE HCL (PF) 1 % IJ SOLN
INTRAMUSCULAR | Status: AC
  Filled 2015-03-31: qty 30

## 2015-03-31 MED ORDER — SODIUM CHLORIDE 0.9 % IJ SOLN: 3.0000 mL | Freq: Two times a day (BID) | INTRAMUSCULAR | Status: DC

## 2015-03-31 MED ORDER — SODIUM CHLORIDE 0.9 % IV SOLN: INTRAVENOUS | Status: DC

## 2015-03-31 MED ORDER — INSULIN DETEMIR 100 UNIT/ML ~~LOC~~ SOLN
40.0000 [IU] | Freq: Two times a day (BID) | SUBCUTANEOUS | Status: DC
  Administered 2015-03-31 – 2015-04-02 (×6): 40 [IU] via SUBCUTANEOUS
  Filled 2015-03-31 (×8): qty 0.4

## 2015-03-31 MED ORDER — ASPIRIN 81 MG PO CHEW: 81.0000 mg | CHEWABLE_TABLET | ORAL | Status: DC

## 2015-03-31 MED ORDER — SODIUM CHLORIDE 0.9 % IJ SOLN
3.0000 mL | Freq: Two times a day (BID) | INTRAMUSCULAR | Status: DC
  Administered 2015-03-31 – 2015-04-03 (×7): 3 mL via INTRAVENOUS

## 2015-03-31 SURGICAL SUPPLY — 9 items
CATH SWAN VIP NON-HEP 7.5F (CATHETERS) ×3 IMPLANT
DRSG SORBAVIEW 3.5X5-5/16 MED (GAUZE/BANDAGES/DRESSINGS) ×2 IMPLANT
HOVERMATT SINGLE USE (MISCELLANEOUS) ×2 IMPLANT
KIT HEART LEFT (KITS) ×3 IMPLANT
KIT HEART RIGHT NAMIC (KITS) ×3 IMPLANT
PACK CARDIAC CATHETERIZATION (CUSTOM PROCEDURE TRAY) ×3 IMPLANT
SHEATH PINNACLE 8F 10CM (SHEATH) ×2 IMPLANT
SLEEVE REPOSITIONING LENGTH 30 (MISCELLANEOUS) ×2 IMPLANT
TRANSDUCER W/STOPCOCK (MISCELLANEOUS) ×4 IMPLANT

## 2015-03-31 NOTE — H&P (View-Only) (Signed)
Advanced Heart Failure Rounding Note  PCP: Stephn D Knowlton Primary Cardiologist: [place]  Subjective:     Says her breathing feels better, denies any episodes of SOB since yesterday. States she is not peeing very much. Has strong urge to urinate but has to sit for a long time with only a small amount of urine production, bladder feels full. Says her belly still feels very tight. No CP or SOB at rest.  Still has occasional lightheadedness on sitting up, but not as bad.     Only out 0.5 L yesterday. Weight shows up 3 lbs. Poor output for 80 lasix IV q8hrs. Having frequent PVCs    Objective:   Weight Range: 278 lb (126.1 kg) Body mass index is 46.26 kg/(m^2).   Vital Signs:   Temp:  [97.7 F (36.5 C)-98.3 F (36.8 C)] 97.9 F (36.6 C) (07/19 0548) Pulse Rate:  [70-81] 71 (07/19 0548) Resp:  [18-19] 18 (07/19 0548) BP: (106-135)/(68-81) 131/75 mmHg (07/19 0839) SpO2:  [97 %-99 %] 99 % (07/19 0548) Weight:  [278 lb (126.1 kg)] 278 lb (126.1 kg) (07/19 0548) Last BM Date: 03/29/15  Weight change: Filed Weights   03/28/15 0520 03/29/15 0300 03/30/15 0548  Weight: 273 lb 9.5 oz (124.1 kg) 275 lb 9.2 oz (125 kg) 278 lb (126.1 kg)    Intake/Output:   Intake/Output Summary (Last 24 hours) at 03/30/15 0858 Last data filed at 03/30/15 0825  Gross per 24 hour  Intake   1304 ml  Output   1650 ml  Net   -346 ml     Physical Exam: General: Obese. No resp difficulty HEENT: Abrasion below left eye. Neck: supple. JVP 11-12 but difficult to assess 2/2 girth. Carotids 2+ bilat; no bruits. No lymphadenopathy or thryomegaly noted.. Cor: PMI nondisplaced. Regular rate & rhythm with frequent ectopy. No rubs. 2/6 HSM LLSB, ?S3 Lungs: Diminished bilaterally. No wheezes or crackles appreciated. Abdomen: marked central obesity, nontender, ++ distention. No hepatosplenomegaly. No bruits or masses. Good bowel sounds. Extremities: no cyanosis, clubbing, rash. 2+ edema to knees  bilaterally. Neuro: alert & orientedx3, cranial nerves grossly intact. moves all 4 extremities w/o difficulty. Affect flat   Telemetry: A sensed V paced in 70s.. Frequent PVCs, including bigeminy.  Labs: CBC No results for input(s): WBC, NEUTROABS, HGB, HCT, MCV, PLT in the last 72 hours. Basic Metabolic Panel  Recent Labs  03/27/15 2223  03/29/15 0322 03/30/15 0316  NA 136  < > 135 132*  K 4.4  < > 3.8 4.0  CL 91*  < > 90* 88*  CO2 34*  < > 34* 36*  GLUCOSE 154*  < > 159* 150*  BUN 19  < > 24* 24*  CALCIUM 9.1  < > 8.4* 8.5*  MG 2.0  --   --  2.0  < > = values in this interval not displayed. Liver Function Tests No results for input(s): AST, ALT, ALKPHOS, BILITOT, PROT, ALBUMIN in the last 72 hours. No results for input(s): LIPASE, AMYLASE in the last 72 hours. Cardiac Enzymes No results for input(s): CKTOTAL, CKMB, CKMBINDEX, TROPONINI in the last 72 hours.  BNP: BNP (last 3 results)  Recent Labs  01/20/15 0040 01/26/15 1100 03/26/15 1633  BNP 166.0* 223.8* 549.5*    ProBNP (last 3 results)  Recent Labs  08/04/14 2339 08/21/14 1117  PROBNP 1727.0* 171.9*     D-Dimer No results for input(s): DDIMER in the last 72 hours. Hemoglobin A1C No results for input(s): HGBA1C in the   last 72 hours. Fasting Lipid Panel No results for input(s): CHOL, HDL, LDLCALC, TRIG, CHOLHDL, LDLDIRECT in the last 72 hours. Thyroid Function Tests No results for input(s): TSH, T4TOTAL, T3FREE, THYROIDAB in the last 72 hours.  Invalid input(s): FREET3  Other results:     Imaging/Studies:   No results found.  Latest Echo  Latest Cath   Medications:     Scheduled Medications: . allopurinol  300 mg Oral Daily  . amiodarone  100 mg Oral Daily  . aspirin EC  81 mg Oral Daily  . carvedilol  3.125 mg Oral BID WC  . digoxin  0.0625 mg Oral Daily  . DULoxetine  30 mg Oral Daily  . ferrous sulfate  325 mg Oral TID  . furosemide  80 mg Intravenous Q8H  . gabapentin   600 mg Oral QID  . heparin  5,000 Units Subcutaneous 3 times per day  . insulin aspart  0-5 Units Subcutaneous QHS  . insulin aspart  0-9 Units Subcutaneous TID WC  . insulin detemir  60 Units Subcutaneous QHS  . ivabradine  2.5 mg Oral BID WC  . Linaclotide  290 mcg Oral Daily  . losartan  12.5 mg Oral QPM  . magnesium oxide  400 mg Oral Daily  . neomycin-bacitracin-polymyxin   Topical BID  . pantoprazole  40 mg Oral Daily  . potassium chloride SA  40 mEq Oral BID  . sodium chloride  3 mL Intravenous Q12H  . spironolactone  25 mg Oral BID  . temazepam  30 mg Oral QHS  . vitamin E  400 Units Oral Daily     Infusions:     PRN Medications:  sodium chloride, acetaminophen, albuterol, cyclobenzaprine, ondansetron (ZOFRAN) IV, oxyCODONE-acetaminophen, sodium chloride   Assessment/Plan   1. 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 - On BB, ARB, ivabradine, spiro, and dig - Continues to be overloaded 2. Atypical CP - Trop negative. Normal cors on cath 4/15 3. Syncope - ICD interrogation  4. CKD Stage 3- Improved 1.38 -> 1.18 5. GERD 6. DM2 7. OSA with CPAP 8. Morbid obesity 9. AKI 10. Frequent PVCs- K 4.0 Mg 2.0 this am. 11. Urinary retention - Would likely benefit from I/O cath vs foley. Will consult MD. 12. H/o VT in setting of hypokalemia 4/16  With poor UO ? Adding a dose of metolazone. If renal function deteriorates will need RHC and possible inotropic support. Given size she is poor candidate for other advanced therapies.   Length of Stay: 3   [name] PA-C 03/30/2015, 8:58 AM  Advanced Heart Failure Team Pager [phone] (M-F; 7a - 4p)  Please contact [facility] for night-coverage after hours (4p -7a ) and weekends on [email or web address]  Patient seen and examined with [name], PA-C. We discussed all aspects of the encounter. I agree with the assessment and plan as stated above.   ICD interrogation shows VF as caused of syncope. She was  resuscitated with ICD shock. Renal function better but poor response to IV lasix. Will add metolazone and watch response. She has advanced HF but options very limited given her size and comorbidities. Home inotropes less attractive with recent VF and ICD shock. Will increase amio to 400 bid. Keep K > = 4.0 mg > = 2.0. May need RHC.  Angellica Maddison,MD 6:26 PM

## 2015-03-31 NOTE — Progress Notes (Addendum)
Advanced Heart Failure Rounding Note  PCP: Lorenza Evangelist Primary Cardiologist: [name]  Subjective:    No complaints with her breathing.  Has occasional that comes and goes regardless of activity.  Peeing better. Stomach still very tight. No SOB rest.  Having occasional lightheadedness on sitting up, but not as bad.  Nurse caught pt sneaking in a [employer] cheeseburger yesterday.  Discussed large amount of salt in just one cheeseburger.     Out 1.4 L yesterday. Weight shows up 1 lb from yesterday. Metolazone added to enhance diuresis.  Will continue to monitor.    Objective:   Weight Range: 279 lb 6.4 oz (126.735 kg) Body mass index is 46.49 kg/(m^2).   Vital Signs:   Temp:  [97.5 F (36.4 C)-98.4 F (36.9 C)] 97.5 F (36.4 C) (07/20 0500) Pulse Rate:  [63-81] 80 (07/20 0500) Resp:  [16-19] 18 (07/20 0500) BP: (121-133)/(67-86) 126/67 mmHg (07/20 0500) SpO2:  [92 %-96 %] 96 % (07/20 0500) Weight:  [279 lb 6.4 oz (126.735 kg)] 279 lb 6.4 oz (126.735 kg) (07/20 0500) Last BM Date: 03/30/15  Weight change: Filed Weights   03/29/15 0300 03/30/15 0548 03/31/15 0500  Weight: 275 lb 9.2 oz (125 kg) 278 lb (126.1 kg) 279 lb 6.4 oz (126.735 kg)    Intake/Output:   Intake/Output Summary (Last 24 hours) at 03/31/15 0732 Last data filed at 03/31/15 0700  Gross per 24 hour  Intake    720 ml  Output   2150 ml  Net  -1430 ml     Physical Exam: General: Obese. No resp difficulty. NAD HEENT: Abrasion below left eye. Neck: supple. JVP 11-12 but difficult to assess 2/2 girth. Carotids 2+ bilat; no bruits. No lymphadenopathy or thryomegaly noted.. Cor: PMI nondisplaced. Regular rate & rhythm with frequent ectopy. No rubs. 2/6 HSM LLSB, ?S3 Lungs: Diminished bilaterally. No wheezes or crackles appreciated. Abdomen: marked central obesity, nontender, ++ distention. No hepatosplenomegaly. No bruits or masses. Good bowel sounds. Extremities: no cyanosis, clubbing, rash. 1-2+  edema to knees bilaterally. Neuro: alert & orientedx3, cranial nerves grossly intact. moves all 4 extremities w/o difficulty. Affect flat   Telemetry: A sensed V paced in 70s. Frequent PVCs, sometimes as often as every other beat.  Labs: CBC No results for input(s): WBC, NEUTROABS, HGB, HCT, MCV, PLT in the last 72 hours. Basic Metabolic Panel  Recent Labs  03/30/15 0316 03/31/15 0301  NA 132* 135  K 4.0 5.0  CL 88* 91*  CO2 36* 31  GLUCOSE 150* 158*  BUN 24* 31*  CALCIUM 8.5* 9.1  MG 2.0  --    Liver Function Tests No results for input(s): AST, ALT, ALKPHOS, BILITOT, PROT, ALBUMIN in the last 72 hours. No results for input(s): LIPASE, AMYLASE in the last 72 hours. Cardiac Enzymes No results for input(s): CKTOTAL, CKMB, CKMBINDEX, TROPONINI in the last 72 hours.  BNP: BNP (last 3 results)  Recent Labs  01/20/15 0040 01/26/15 1100 03/26/15 1633  BNP 166.0* 223.8* 549.5*    ProBNP (last 3 results)  Recent Labs  08/04/14 2339 08/21/14 1117  PROBNP 1727.0* 171.9*     D-Dimer No results for input(s): DDIMER in the last 72 hours. Hemoglobin A1C No results for input(s): HGBA1C in the last 72 hours. Fasting Lipid Panel No results for input(s): CHOL, HDL, LDLCALC, TRIG, CHOLHDL, LDLDIRECT in the last 72 hours. Thyroid Function Tests No results for input(s): TSH, T4TOTAL, T3FREE, THYROIDAB in the last 72 hours.  Invalid input(s): FREET3  Other  results:     Imaging/Studies:  No results found.  Latest Echo  Latest Cath   Medications:     Scheduled Medications: . allopurinol  300 mg Oral Daily  . amiodarone  400 mg Oral BID  . aspirin EC  81 mg Oral Daily  . carvedilol  3.125 mg Oral BID WC  . digoxin  0.0625 mg Oral Daily  . docusate sodium  200 mg Oral BID  . DULoxetine  30 mg Oral Daily  . ferrous sulfate  325 mg Oral TID  . furosemide  80 mg Intravenous Q8H  . gabapentin  600 mg Oral QID  . heparin  5,000 Units Subcutaneous 3 times per  day  . insulin aspart  0-5 Units Subcutaneous QHS  . insulin aspart  0-9 Units Subcutaneous TID WC  . insulin detemir  60 Units Subcutaneous QHS  . ivabradine  2.5 mg Oral BID WC  . Linaclotide  290 mcg Oral Daily  . losartan  12.5 mg Oral QPM  . magnesium oxide  400 mg Oral Daily  . metolazone  5 mg Oral Daily  . neomycin-bacitracin-polymyxin   Topical BID  . pantoprazole  40 mg Oral Daily  . potassium chloride SA  40 mEq Oral BID  . sodium chloride  3 mL Intravenous Q12H  . spironolactone  25 mg Oral BID  . temazepam  30 mg Oral QHS  . vitamin E  400 Units Oral Daily    Infusions:    PRN Medications: sodium chloride, acetaminophen, albuterol, cyclobenzaprine, ondansetron (ZOFRAN) IV, oxyCODONE-acetaminophen, sodium chloride   Assessment/Plan   1. 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 - On BB, ARB, ivabradine, spiro, and dig - Continues to be overloaded 2. Atypical CP - Trop negative. Normal cors on cath 4/15 3. VF with ICD resuscitation - by ICD interrogation  - Initially thought to be syncope, determined to be VF with ICD shock resuscitation. 4. CKD Stage 3- bumped back up. Cr 1.38 -> 1.18 -> 1.43. Will follow closely. 5. GERD 6. DM2 7. OSA with CPAP 8. Morbid obesity 9. AKI 10. Frequent PVCs- K 5.0 today  - Mg 2.0 03/1915, will check today. 11. Urinary retention - Bladder scan 03/30/15 showed 273 cc. Pt was then able to pass 300 cc with 0 cc residual. 12. H/o VT in setting of hypokalemia 4/16  Cont amio to 400 BID yesterday. Metolazone added as needed. If renal function continues to deteriorate may benefit RHC. Will likely get RHC this admission. Given size and comorbidities she is poor candidate for other advanced therapies. Home inotropes unlikely with recent VF and ICD shock  Length of Stay: 4   [name] PA-C 03/31/2015, 7:32 AM  Advanced Heart Failure Team Pager [phone] (M-F; 7a - 4p)  Please contact [facility] for  night-coverage after hours (4p -7a ) and weekends on [email or web address]  Patient seen and examined with [name], PA-C. We discussed all aspects of the encounter. I agree with the assessment and plan as stated above.   Suspect low output HF with persistent volume overload and worsening renal function. Plan RHC today. Amio dose increased. No further VT/VF. PVCs less frequent. Watch electrolytes carefully.   Jessup Ogas,MD

## 2015-03-31 NOTE — Progress Notes (Signed)
[facility] PROGRESS NOTE Interim History: 49 year old female with morbid obesity, obstructive sleep apnea on CPAP, nonischemic cardiomyopathy, chronic systolic and diastolic CHF, EF 02-63% with grade 3 diastolic dysfunction per echo in March 2016, GERD, diabetes mellitus on insulin presented to ED with syncopal episode and progressive worsening shortness of breath. History was obtained from the patient and her spouse in the room. Patient reported that she had noticed about 12 pounds weight gain in the last few weeks with abdominal distention and worsening shortness of breath, using 2-3 pillows to sleep. Patient reports that she had to reschedule her prior appointment with Dr. [name], and her next appointment was next week. Patient had called [facility] hotline nurse today regarding her weight gain when while talking to the nurse she experienced a syncopal episode and fell on the floor striking the left side of her head. Patient's husband came to the house in the few minutes and found her on the floor however she was alert and awake at that time. Patient reported chest soreness/pressure for last few days, constant, with no radiation, no nausea or vomiting or diaphoresis. She did have a feeling of dizziness and lightheadedness prior to the syncopal episode Filed Weights   03/29/15 0300 03/30/15 0548 03/31/15 0500  Weight: 125 kg (275 lb 9.2 oz) 126.1 kg (278 lb) 126.735 kg (279 lb 6.4 oz)        Intake/Output Summary (Last 24 hours) at 03/31/15 0853 Last data filed at 03/31/15 0700  Gross per 24 hour  Intake    480 ml  Output   2150 ml  Net  -1670 ml     Assessment/Plan: Syncope likely due to V. tach per AICD: She was resuscitated with an ICD shock, cardiology was consulted and recommended to increase amiodarone to 400 mg twice a day. No further events on telemetry.  Acute on chronic combined systolic and diastolic heart failure: She has been on high-dose diuretics since admission with  sluggish diuresis,  metolazone was added and diuresis has improve. Advanced heart failure teams continues to follow. Might need right heart cath.  Insulin-dependent diabetes mellitus: With several episodes of hypoglycemia in the hospital Lantus was reduced to half. Her A1c was 7.5. - Her sugars are trending up will continue sliding scale insulin and increase Lantus.  GERD: Continue Protonix.  Essential hypertension: Continue Coreg, losartan, metolazone and Lasix.  Her blood pressure is close to goal.  Atypical chest pain: Narrowing CT imaging of the chest negative for PE, she relates is slightly improved.  Morbid obesity: Counseling.  Code Status: full Family Communication: none  Disposition Plan: home in 3-5 days   Consultants:  cardiology  Procedures: ECHO: none   Antibiotics:  None  HPI/Subjective: No complains  Objective: Filed Vitals:   03/30/15 1635 03/30/15 2006 03/30/15 2012 03/31/15 0500  BP: 121/71 122/69 133/86 126/67  Pulse: 81  63 80  Temp: 98.4 F (36.9 C)  98 F (36.7 C) 97.5 F (36.4 C)  TempSrc: Oral  Oral Oral  Resp: 16 19 18 18   Height:      Weight:    126.735 kg (279 lb 6.4 oz)  SpO2: 92% 92% 95% 96%     Exam:  General: Alert, awake, oriented x3, in no acute distress.  HEENT: No bruits, no goiter.  Heart: Regular rate and rhythm. Woody edema +2. Lungs: Good air movement, clear Abdomen: Soft, nontender, nondistended, positive bowel sounds.  Neuro: Grossly intact, nonfocal.   Data Reviewed: Basic Metabolic Panel:  Recent  Labs Lab 03/26/15 1633  03/27/15 2223 03/28/15 0415 03/29/15 0322 03/30/15 0316 03/31/15 0301  NA 138  < > 136 135 135 132* 135  K 3.7  < > 4.4 3.7 3.8 4.0 5.0  CL 98*  < > 91* 90* 90* 88* 91*  CO2 29  < > 34* 35* 34* 36* 31  GLUCOSE 180*  < > 154* 126* 159* 150* 158*  BUN 14  < > 19 19 24* 24* 31*  CREATININE 1.20*  < > 1.34* 1.20* 1.38* 1.18* 1.43*  CALCIUM 9.0  < > 9.1 8.7* 8.4* 8.5* 9.1  MG  1.8  --  2.0  --   --  2.0  --   < > = values in this interval not displayed. Liver Function Tests:  Recent Labs Lab 03/26/15 1633  AST 32  ALT 23  ALKPHOS 102  BILITOT 0.9  PROT 7.9  ALBUMIN 3.4*   No results for input(s): LIPASE, AMYLASE in the last 168 hours. No results for input(s): AMMONIA in the last 168 hours. CBC:  Recent Labs Lab 03/26/15 1633  WBC 5.7  HGB 14.4  HCT 44.3  MCV 93.9  PLT 231   Cardiac Enzymes:  Recent Labs Lab 03/26/15 2125 03/27/15 0245 03/27/15 0855  TROPONINI <0.03 <0.03 0.05*   BNP (last 3 results)  Recent Labs  01/20/15 0040 01/26/15 1100 03/26/15 1633  BNP 166.0* 223.8* 549.5*    ProBNP (last 3 results)  Recent Labs  08/04/14 2339 08/21/14 1117  PROBNP 1727.0* 171.9*    CBG:  Recent Labs Lab 03/30/15 0730 03/30/15 1130 03/30/15 1629 03/30/15 2100 03/31/15 0732  GLUCAP 135* 130* 217* 184* 216*    No results found for this or any previous visit (from the past 240 hour(s)).   Studies: No results found.  Scheduled Meds: . allopurinol  300 mg Oral Daily  . amiodarone  400 mg Oral BID  . aspirin EC  81 mg Oral Daily  . carvedilol  3.125 mg Oral BID WC  . digoxin  0.0625 mg Oral Daily  . docusate sodium  200 mg Oral BID  . DULoxetine  30 mg Oral Daily  . ferrous sulfate  325 mg Oral TID  . furosemide  80 mg Intravenous Q8H  . gabapentin  600 mg Oral QID  . heparin  5,000 Units Subcutaneous 3 times per day  . insulin aspart  0-5 Units Subcutaneous QHS  . insulin aspart  0-9 Units Subcutaneous TID WC  . insulin detemir  60 Units Subcutaneous QHS  . ivabradine  2.5 mg Oral BID WC  . Linaclotide  290 mcg Oral Daily  . losartan  12.5 mg Oral QPM  . magnesium oxide  400 mg Oral Daily  . metolazone  5 mg Oral Daily  . neomycin-bacitracin-polymyxin   Topical BID  . pantoprazole  40 mg Oral Daily  . potassium chloride SA  40 mEq Oral BID  . sodium chloride  3 mL Intravenous Q12H  . spironolactone  25 mg  Oral BID  . temazepam  30 mg Oral QHS  . vitamin E  400 Units Oral Daily   Continuous Infusions:    Marinda Elk  [facility] Pager [phone] If 7PM-7AM, please contact night-coverage at [email or web address], password [facility] 03/31/2015, 8:53 AM  LOS: 4 days

## 2015-03-31 NOTE — Interval H&P Note (Signed)
History and Physical Interval Note:  03/31/2015 3:13 PM  [name]  has presented today for surgery, with the diagnosis of hf  The various methods of treatment have been discussed with the patient and family. After consideration of risks, benefits and other options for treatment, the patient has consented to  Procedure(s): Right Heart Cath ([name]) as a surgical intervention .  The patient's history has been reviewed, patient examined, no change in status, stable for surgery.  I have reviewed the patient's chart and labs.  Questions were answered to the patient's satisfaction.     Bensimhon, Reuel Boom

## 2015-03-31 NOTE — Progress Notes (Signed)
CARDIAC REHAB PHASE I   PRE:  Rate/Rhythm: vent paced 82  BP:  Supine:   Sitting: 149/106 large cuff upper arm, 154/85 small cuff lower arm  Standing:    SaO2: 95%RA  MODE:  Ambulation: 140 ft   POST:  Rate/Rhythm: 84 paced  BP:  Supine:   Sitting: 131/81  Standing:    SaO2: 92%RA hall and room [id number] Pt walked 140 ft on RA with gait belt use, rolling walker and asst x 1. One asst to follow with wheelchair. Pt sat once to rest. Pt c/o slight dizziness and SOB. To recliner after walk with call bell. Would benefit from PT consult.    Luetta Nutting, RN BSN  03/31/2015 10:20 AM

## 2015-04-01 ENCOUNTER — Encounter (HOSPITAL_COMMUNITY): Payer: Self-pay | Admitting: Internal Medicine

## 2015-04-01 DIAGNOSIS — I5021 Acute systolic (congestive) heart failure: Secondary | ICD-10-CM

## 2015-04-01 DIAGNOSIS — R570 Cardiogenic shock: Secondary | ICD-10-CM | POA: Insufficient documentation

## 2015-04-01 DIAGNOSIS — I509 Heart failure, unspecified: Secondary | ICD-10-CM

## 2015-04-01 LAB — CARBOXYHEMOGLOBIN
CARBOXYHEMOGLOBIN: 1.7 % — AB (ref 0.5–1.5)
Methemoglobin: 1.3 % (ref 0.0–1.5)
O2 SAT: 67.3 %
Total hemoglobin: 14 g/dL (ref 12.0–16.0)

## 2015-04-01 LAB — BASIC METABOLIC PANEL
ANION GAP: 11 (ref 5–15)
Anion gap: 10 (ref 5–15)
BUN: 31 mg/dL — ABNORMAL HIGH (ref 6–20)
BUN: 31 mg/dL — ABNORMAL HIGH (ref 6–20)
CHLORIDE: 81 mmol/L — AB (ref 101–111)
CO2: 41 mmol/L — ABNORMAL HIGH (ref 22–32)
CO2: 41 mmol/L — AB (ref 22–32)
CREATININE: 1.36 mg/dL — AB (ref 0.44–1.00)
Calcium: 9 mg/dL (ref 8.9–10.3)
Calcium: 8.7 mg/dL — ABNORMAL LOW (ref 8.9–10.3)
Chloride: 85 mmol/L — ABNORMAL LOW (ref 101–111)
Creatinine, Ser: 1.37 mg/dL — ABNORMAL HIGH (ref 0.44–1.00)
GFR calc Af Amer: 52 mL/min — ABNORMAL LOW (ref >60)
GFR, EST AFRICAN AMERICAN: 52 mL/min — AB (ref >60)
GFR, EST NON AFRICAN AMERICAN: 45 mL/min — AB (ref >60)
GFR, EST NON AFRICAN AMERICAN: 44 mL/min — AB (ref >60)
GLUCOSE: 171 mg/dL — AB (ref 65–99)
Glucose, Bld: 125 mg/dL — ABNORMAL HIGH (ref 65–99)
Potassium: 3.7 mmol/L (ref 3.5–5.1)
Potassium: 4 mmol/L (ref 3.5–5.1)
SODIUM: 137 mmol/L (ref 135–145)
SODIUM: 132 mmol/L — AB (ref 135–145)

## 2015-04-01 LAB — GLUCOSE, CAPILLARY
GLUCOSE-CAPILLARY: 127 mg/dL — AB (ref 65–99)
GLUCOSE-CAPILLARY: 156 mg/dL — AB (ref 65–99)
Glucose-Capillary: 150 mg/dL — ABNORMAL HIGH (ref 65–99)
Glucose-Capillary: 177 mg/dL — ABNORMAL HIGH (ref 65–99)

## 2015-04-01 MED ORDER — POTASSIUM CHLORIDE CRYS ER 20 MEQ PO TBCR: 20.0000 meq | EXTENDED_RELEASE_TABLET | Freq: Two times a day (BID) | ORAL | Status: DC

## 2015-04-01 MED ORDER — POTASSIUM CHLORIDE CRYS ER 20 MEQ PO TBCR
40.0000 meq | EXTENDED_RELEASE_TABLET | Freq: Three times a day (TID) | ORAL | Status: DC
  Administered 2015-04-01 – 2015-04-08 (×22): 40 meq via ORAL
  Filled 2015-04-01 (×7): qty 2
  Filled 2015-04-01: qty 4
  Filled 2015-04-01 (×19): qty 2

## 2015-04-01 MED ORDER — CETYLPYRIDINIUM CHLORIDE 0.05 % MT LIQD
7.0000 mL | Freq: Two times a day (BID) | OROMUCOSAL | Status: DC
  Administered 2015-04-01 – 2015-04-07 (×14): 7 mL via OROMUCOSAL

## 2015-04-01 MED ORDER — FUROSEMIDE 10 MG/ML IJ SOLN
80.0000 mg | Freq: Two times a day (BID) | INTRAMUSCULAR | Status: DC
  Administered 2015-04-01 – 2015-04-03 (×5): 80 mg via INTRAVENOUS
  Filled 2015-04-01 (×6): qty 8

## 2015-04-01 MED ORDER — POTASSIUM CHLORIDE CRYS ER 20 MEQ PO TBCR: 40.0000 meq | EXTENDED_RELEASE_TABLET | Freq: Once | ORAL | Status: DC

## 2015-04-01 MED FILL — Heparin Sodium (Porcine) 2 Unit/ML in Sodium Chloride 0.9%: INTRAMUSCULAR | Qty: 500 | Status: AC

## 2015-04-01 MED FILL — Lidocaine HCl Local Preservative Free (PF) Inj 1%: INTRAMUSCULAR | Qty: 30 | Status: AC

## 2015-04-01 NOTE — Progress Notes (Signed)
[facility] PROGRESS NOTE Interim History: 49 year old female with morbid obesity, obstructive sleep apnea on CPAP, nonischemic cardiomyopathy, chronic systolic and diastolic CHF, EF 16-10% with grade 3 diastolic dysfunction per echo in March 2016, GERD, diabetes mellitus on insulin presented to ED with syncopal episode and progressive worsening shortness of breath. Syncopal episode due to V-tach catch by AICD. With ADHF on IV milrinoe and diuretics.  Filed Weights   03/30/15 0548 03/31/15 0500 04/01/15 0500  Weight: 126.1 kg (278 lb) 126.735 kg (279 lb 6.4 oz) 122.7 kg (270 lb 8.1 oz)        Intake/Output Summary (Last 24 hours) at 04/01/15 0755 Last data filed at 04/01/15 0700  Gross per 24 hour  Intake  499.5 ml  Output   3330 ml  Net -2830.5 ml     Assessment/Plan: Syncope likely due to V. tach per AICD: She was resuscitated with an ICD shock, cardiology was consulted and recommended to increase amiodarone to 400 mg twice a day. No further events on telemetry.  Acute on chronic combined systolic and diastolic heart failure: She has been on high-dose diuretics since admission with sluggish diuresis,  metolazone was added and diuresis has improve. RHC performed now on milrinone CVP cont to be high.  Pt relates dizziness upon standing. Diuresis has improved with inotrope's. Cont lytes replacement. Cr has plateau.  Insulin-dependent diabetes mellitus: With several episodes of hypoglycemia on admission, ? Due to worsening renal function. Her A1c was 7.5. Improved BG.  GERD: Continue Protonix.  Essential hypertension: Continue Coreg, losartan, metolazone and Lasix.  Her blood pressure is close to goal.  Atypical chest pain: CT imaging of the chest negative for PE, she relates is slightly improved.  Morbid obesity: Counseling.  Code Status: full Family Communication: none  Disposition Plan: home in 3-5  days   Consultants:  cardiology  Procedures: ECHO: none   Antibiotics:  None  HPI/Subjective: No complains, d/w pt the need for diet compliance.  Objective: Filed Vitals:   04/01/15 0400 04/01/15 0500 04/01/15 0600 04/01/15 0700  BP: 120/60 116/63 104/62 119/59  Pulse: 41 41 41 41  Temp: 97.7 F (36.5 C) 97.9 F (36.6 C) 97.9 F (36.6 C) 97.9 F (36.6 C)  TempSrc: Core (Comment)     Resp: Height:      Weight:  122.7 kg (270 lb 8.1 oz)    SpO2: 99% 98% 99% 99%     Exam:  General: Alert, awake, oriented x3, in no acute distress.  HEENT: No bruits, no goiter.  Heart: Regular rate and rhythm. Woody edema +2. Lungs: Good air movement, clear Abdomen: Soft, nontender, nondistended, positive bowel sounds.  Neuro: Grossly intact, nonfocal.   Data Reviewed: Basic Metabolic Panel:  Recent Labs Lab 03/26/15 1633  03/27/15 2223  03/29/15 0322 03/30/15 0316 03/31/15 0301 03/31/15 1114 03/31/15 1207 04/01/15 0356  NA 138  < > 136  < > 135 132* 135  --  134* 137  K 3.7  < > 4.4  < > 3.8 4.0 5.0  --  4.1 3.7  CL 98*  < > 91*  < > 90* 88* 91*  --  90* 85*  CO2 29  < > 34*  < > 34* 36* 31  --  32 41*  GLUCOSE 180*  < > 154*  < > 159* 150* 158*  --  156* 125*  BUN 14  < > 19  < > 24* 24* 31*  --  34* 31*  CREATININE 1.20*  < > 1.34*  < > 1.38* 1.18* 1.43*  --  1.43* 1.36*  CALCIUM 9.0  < > 9.1  < > 8.4* 8.5* 9.1  --  9.1 9.0  MG 1.8  --  2.0  --   --  2.0  --  2.2  --   --   < > = values in this interval not displayed. Liver Function Tests:  Recent Labs Lab 03/26/15 1633  AST 32  ALT 23  ALKPHOS 102  BILITOT 0.9  PROT 7.9  ALBUMIN 3.4*   No results for input(s): LIPASE, AMYLASE in the last 168 hours. No results for input(s): AMMONIA in the last 168 hours. CBC:  Recent Labs Lab 03/26/15 1633 03/31/15 1207  WBC 5.7 5.2  HGB 14.4 15.0  HCT 44.3 46.0  MCV 93.9 93.7  PLT 231 239   Cardiac Enzymes:  Recent Labs Lab 03/26/15 2125  03/27/15 0245 03/27/15 0855  TROPONINI <0.03 <0.03 0.05*   BNP (last 3 results)  Recent Labs  01/20/15 0040 01/26/15 1100 03/26/15 1633  BNP 166.0* 223.8* 549.5*    ProBNP (last 3 results)  Recent Labs  08/04/14 2339 08/21/14 1117  PROBNP 1727.0* 171.9*    CBG:  Recent Labs Lab 03/30/15 1629 03/30/15 2100 03/31/15 0732 03/31/15 1131 03/31/15 1824  GLUCAP 217* 184* 216* 166*  166*  166* 113*    No results found for this or any previous visit (from the past 240 hour(s)).   Studies: No results found.  Scheduled Meds: . allopurinol  300 mg Oral Daily  . amiodarone  400 mg Oral BID  . antiseptic oral rinse  7 mL Mouth Rinse BID  . aspirin EC  81 mg Oral Daily  . carvedilol  3.125 mg Oral BID WC  . digoxin  0.0625 mg Oral Daily  . docusate sodium  200 mg Oral BID  . DULoxetine  30 mg Oral Daily  . ferrous sulfate  325 mg Oral TID  . gabapentin  600 mg Oral QID  . heparin  5,000 Units Subcutaneous 3 times per day  . insulin aspart  0-5 Units Subcutaneous QHS  . insulin aspart  0-9 Units Subcutaneous TID WC  . insulin detemir  40 Units Subcutaneous BID  . ivabradine  2.5 mg Oral BID WC  . Linaclotide  290 mcg Oral Daily  . losartan  12.5 mg Oral QPM  . magnesium oxide  400 mg Oral Daily  . metolazone  5 mg Oral Daily  . neomycin-bacitracin-polymyxin   Topical BID  . pantoprazole  40 mg Oral Daily  . potassium chloride  20 mEq Oral BID  . sodium chloride  3 mL Intravenous Q12H  . sodium chloride  3 mL Intravenous Q12H  . sodium chloride  3 mL Intravenous Q12H  . spironolactone  25 mg Oral BID  . temazepam  30 mg Oral QHS  . vitamin E  400 Units Oral Daily   Continuous Infusions: . milrinone 0.25 mcg/kg/min (04/01/15 0200)     Marinda Elk  [facility] Pager [phone] If 7PM-7AM, please contact night-coverage at [email or web address], password [facility] 04/01/2015, 7:55 AM  LOS: 5 days

## 2015-04-01 NOTE — Progress Notes (Signed)
PT Cancellation Note  Patient Details Name: [name] MRN: [id number] DOB: [birth year]   Cancelled Treatment:    Reason Eval/Treat Not Completed: Medical issues which prohibited therapy (Pt s/p cath last night with swan in place. Per RN pt dizzy with sitting and PA Til[place] of heart failure team requested hold eval and ambulation until next date)   Delorse Lek 04/01/2015, 7:46 AM Delaney Meigs, PT [phone]

## 2015-04-01 NOTE — Care Management Important Message (Signed)
Important Message  Patient Details  Name: [name] MRN: [id number] Date of Birth: 03-18-1966   Medicare Important Message Given:  Yes-third notification given    [name] 04/01/2015, 1:41 PM

## 2015-04-01 NOTE — Progress Notes (Signed)
IR called spoke with nurse of pt, they're unable to take pt to insert picc today unless emergently needed for treatment or d/c. Since there isn't a PICC nurse today, they have been only taking those emergent cases. PICC nurse to be back on schedule tmrw (Friday 22nd) and will be on schedule to insert [name]'s picc then.

## 2015-04-01 NOTE — Care Management Note (Signed)
Case Management Note  Patient Details  Name: [name] MRN: [id number] Date of Birth: [birth year]  Subjective/Objective:     Adm w heart failure               Action/Plan: lives w fam [place] pcp dr Katharine Look   Expected Discharge Date:               Expected Discharge Plan:  Home w Home Health Services  In-House Referral:  NA  Discharge planning Services  CM Consult  Post Acute Care Choice:  Home Health, Durable Medical Equipment Choice offered to:  Patient  DME Arranged:  IV pump/equipment DME Agency:  [facility]  HH Arranged:  RN, Disease Management HH Agency:  [facility], [facility]  Status of Service:  Completed, signed off  Medicare Important Message Given:  Yes-second notification given Date Medicare IM Given:    Medicare IM give by:    Date Additional Medicare IM Given:    Additional Medicare Important Message give by:     If discussed at Long Length of Stay Meetings, dates discussed:  04/01/15   Additional Comments: went over [facility] agency list. Pt chose [facility] for home iv milrinone. Ref to pam chandler w [facility] for home milrinone. [facility] will also follow pt for community rn case mgt.  Hanley Hays, RN 04/01/2015, 1:30 PM

## 2015-04-01 NOTE — Progress Notes (Signed)
Advanced Heart Failure Rounding Note  PCP: Lorenza Evangelist Primary Cardiologist: [name]  Subjective:    RHC yesterday as below, now on milrinone. Says her breathing feels a lot better but she is still feeling dizzy occasionally, especially with sitting up.     Out 2.8 L yesterday on IV lasix 80 mg q8hrs. Weight down 9 lbs. On Milrinone after RHC. Coox 67.2% this morning, improved from 45 yesterday. CVP 6. Volume status improved  RHC 03/31/15 RA = 28 RV = 83/19/30 PA = 86/38 (65) PCW = 36 Fick cardiac output/index = 2.9/1.3 Thermo cardiac output/index = 3.1/1.3 PVR = 10 Arterial sat = 99% PA sat = 45%, 48%  1. Markedly elevated filling pressure with severely depressed cardiac output c/w cardiogenic shock.  Objective:   Weight Range: 270 lb 8.1 oz (122.7 kg) Body mass index is 45.01 kg/(m^2).   Vital Signs:   Temp:  [97 F (36.1 C)-97.9 F (36.6 C)] 97.9 F (36.6 C) (07/21 0700) Pulse Rate:  [33-268] 41 (07/21 0700) Resp:  [0-30] 17 (07/21 0700) BP: (104-152)/(58-108) 119/59 mmHg (07/21 0700) SpO2:  [0 %-100 %] 99 % (07/21 0700) Weight:  [270 lb 8.1 oz (122.7 kg)] 270 lb 8.1 oz (122.7 kg) (07/21 0500) Last BM Date: 03/30/15  Weight change: Filed Weights   03/30/15 0548 03/31/15 0500 04/01/15 0500  Weight: 278 lb (126.1 kg) 279 lb 6.4 oz (126.735 kg) 270 lb 8.1 oz (122.7 kg)    Intake/Output:   Intake/Output Summary (Last 24 hours) at 04/01/15 0716 Last data filed at 04/01/15 0700  Gross per 24 hour  Intake  499.5 ml  Output   3330 ml  Net -2830.5 ml     Physical Exam: General: Obese. No resp difficulty. NAD HEENT: Abrasion below left eye. Neck: supple. JVP but difficult to assess 2/2 girth. Carotids 2+ bilat; no bruits. No lymphadenopathy or thryomegaly noted. Cor: PMI nondisplaced. Regular rate & rhythm with frequent ectopy. No rubs. 2/6 HSM LLSB, ?S3 Lungs: Diminished bilaterally. No wheezes or crackles noted. Abdomen: marked central obesity,  nontender, + distention, softer today. No hepatosplenomegaly. No bruits or masses. Good bowel sounds. Extremities: no cyanosis, clubbing, rash. 1+ edema to mid calf bilaterally. Neuro: alert & orientedx3, cranial nerves grossly intact. moves all 4 extremities w/o difficulty. Affect flat   Telemetry: A sensed V paced in 80s. PVC frequency seems decreased.  Labs: CBC  Recent Labs  03/31/15 1207  WBC 5.2  HGB 15.0  HCT 46.0  MCV 93.7  PLT 239   Basic Metabolic Panel  Recent Labs  03/30/15 0316  03/31/15 1114 03/31/15 1207 04/01/15 0356  NA 132*  < >  --  134* 137  K 4.0  < >  --  4.1 3.7  CL 88*  < >  --  90* 85*  CO2 36*  < >  --  32 41*  GLUCOSE 150*  < >  --  156* 125*  BUN 24*  < >  --  34* 31*  CALCIUM 8.5*  < >  --  9.1 9.0  MG 2.0  --  2.2  --   --   < > = values in this interval not displayed. Liver Function Tests No results for input(s): AST, ALT, ALKPHOS, BILITOT, PROT, ALBUMIN in the last 72 hours. No results for input(s): LIPASE, AMYLASE in the last 72 hours. Cardiac Enzymes No results for input(s): CKTOTAL, CKMB, CKMBINDEX, TROPONINI in the last 72 hours.  BNP: BNP (last 3 results)  Recent Labs  01/20/15 0040 01/26/15 1100 03/26/15 1633  BNP 166.0* 223.8* 549.5*    ProBNP (last 3 results)  Recent Labs  08/04/14 2339 08/21/14 1117  PROBNP 1727.0* 171.9*     D-Dimer No results for input(s): DDIMER in the last 72 hours. Hemoglobin A1C No results for input(s): HGBA1C in the last 72 hours. Fasting Lipid Panel No results for input(s): CHOL, HDL, LDLCALC, TRIG, CHOLHDL, LDLDIRECT in the last 72 hours. Thyroid Function Tests No results for input(s): TSH, T4TOTAL, T3FREE, THYROIDAB in the last 72 hours.  Invalid input(s): FREET3  Other results:     Imaging/Studies:  No results found.  Latest Echo  Latest Cath   Medications:     Scheduled Medications: . allopurinol  300 mg Oral Daily  . amiodarone  400 mg Oral BID  .  antiseptic oral rinse  7 mL Mouth Rinse BID  . aspirin EC  81 mg Oral Daily  . carvedilol  3.125 mg Oral BID WC  . digoxin  0.0625 mg Oral Daily  . docusate sodium  200 mg Oral BID  . DULoxetine  30 mg Oral Daily  . ferrous sulfate  325 mg Oral TID  . furosemide  80 mg Intravenous Q8H  . gabapentin  600 mg Oral QID  . heparin  5,000 Units Subcutaneous 3 times per day  . insulin aspart  0-5 Units Subcutaneous QHS  . insulin aspart  0-9 Units Subcutaneous TID WC  . insulin detemir  40 Units Subcutaneous BID  . ivabradine  2.5 mg Oral BID WC  . Linaclotide  290 mcg Oral Daily  . losartan  12.5 mg Oral QPM  . magnesium oxide  400 mg Oral Daily  . metolazone  5 mg Oral Daily  . neomycin-bacitracin-polymyxin   Topical BID  . pantoprazole  40 mg Oral Daily  . sodium chloride  3 mL Intravenous Q12H  . sodium chloride  3 mL Intravenous Q12H  . sodium chloride  3 mL Intravenous Q12H  . spironolactone  25 mg Oral BID  . temazepam  30 mg Oral QHS  . vitamin E  400 Units Oral Daily    Infusions: . milrinone 0.25 mcg/kg/min (04/01/15 0200)    PRN Medications: sodium chloride, sodium chloride, sodium chloride, acetaminophen, albuterol, cyclobenzaprine, ondansetron (ZOFRAN) IV, oxyCODONE-acetaminophen, sodium chloride, sodium chloride, sodium chloride   Assessment/Plan   1. 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 - On BB, ARB, ivabradine, spiro, and dig - Continues to be overloaded 2. Atypical CP - Trop negative. Normal cors on cath 4/15 3. VF with ICD resuscitation - by ICD interrogation  - Initially thought to be syncope, determined to be VF with ICD shock resuscitation. 4. CKD Stage 3- bumped back up. Cr 1.38 -> 1.18 -> 1.43. Will follow closely. 5. GERD 6. DM2 7. OSA with CPAP 8. Morbid obesity 9. AKI 10. Frequent PVCs- K 3.7 today  - Mg 2.2 03/31/15 11. Urinary retention - Bladder scan 03/30/15 showed 273 cc. Pt was then able to pass 300 cc with 0 cc residual. 12.  H/o VT in setting of hypokalemia 4/16 - Cont amio 400 BID. K 3.7 today, will consult pharm for best supplementation with holding of diuresis for now.  Now on milrinone. Coox 67.2 and CVP 16.  Continue diuresis. Given size and comorbidities she is poor candidate for other advanced therapies. Will supplement electrolytes with goal of K > 4.0 and Mg > 2.0, Will supp as needed.  Length of Stay: 5   [name] PA-C 04/01/2015, 7:16 AM  Advanced Heart Failure Team Pager [phone] (M-F; 7a - 4p)  Please contact [facility] for night-coverage after hours (4p -7a ) and weekends on [email or web address]   Patient seen and examined with [name], PA-C. We discussed all aspects of the encounter. I agree with the assessment and plan as stated above.  Ernestine Conrad numbers done personally. Much improved with milrinone. Still with volume on board. Continue diuresis. Will need PICC for home inotropes. Unfortunately not candidate for other advanced therapies at this time due to size. VT quiescent. Continue amio. Recheck electrolytes this afternoon. Had VT/VF in setting of hypokalemia recently.   The patient is critically ill with multiple organ systems failure and requires high complexity decision making for assessment and support, frequent evaluation and titration of therapies, application of advanced monitoring technologies and extensive interpretation of multiple databases.   Critical Care Time personally devoted to patient care services described in this note is 35 Minutes.    Bensimhon, Daniel,MD 5:20 PM

## 2015-04-02 ENCOUNTER — Encounter (HOSPITAL_COMMUNITY): Payer: Self-pay | Admitting: Internal Medicine

## 2015-04-02 ENCOUNTER — Inpatient Hospital Stay (HOSPITAL_COMMUNITY): Payer: Medicare Other

## 2015-04-02 DIAGNOSIS — R55 Syncope and collapse: Secondary | ICD-10-CM

## 2015-04-02 LAB — BASIC METABOLIC PANEL
ANION GAP: 9 (ref 5–15)
BUN: 32 mg/dL — AB (ref 6–20)
CALCIUM: 9 mg/dL (ref 8.9–10.3)
CO2: 42 mmol/L — AB (ref 22–32)
Chloride: 84 mmol/L — ABNORMAL LOW (ref 101–111)
Creatinine, Ser: 1.51 mg/dL — ABNORMAL HIGH (ref 0.44–1.00)
GFR calc Af Amer: 46 mL/min — ABNORMAL LOW (ref >60)
GFR, EST NON AFRICAN AMERICAN: 40 mL/min — AB (ref >60)
Glucose, Bld: 135 mg/dL — ABNORMAL HIGH (ref 65–99)
Potassium: 4.5 mmol/L (ref 3.5–5.1)
Sodium: 135 mmol/L (ref 135–145)

## 2015-04-02 LAB — GLUCOSE, CAPILLARY
GLUCOSE-CAPILLARY: 161 mg/dL — AB (ref 65–99)
GLUCOSE-CAPILLARY: 163 mg/dL — AB (ref 65–99)
Glucose-Capillary: 128 mg/dL — ABNORMAL HIGH (ref 65–99)
Glucose-Capillary: 226 mg/dL — ABNORMAL HIGH (ref 65–99)

## 2015-04-02 LAB — CARBOXYHEMOGLOBIN
Carboxyhemoglobin: 1.4 % (ref 0.5–1.5)
Methemoglobin: 0.9 % (ref 0.0–1.5)
O2 Saturation: 63.5 %
TOTAL HEMOGLOBIN: 14.3 g/dL (ref 12.0–16.0)

## 2015-04-02 LAB — MAGNESIUM: MAGNESIUM: 2 mg/dL (ref 1.7–2.4)

## 2015-04-02 LAB — MRSA PCR SCREENING: MRSA by PCR: NEGATIVE

## 2015-04-02 MED ORDER — HEPARIN (PORCINE) IN NACL 2-0.9 UNIT/ML-% IJ SOLN
INTRAMUSCULAR | Status: DC | PRN
  Administered 2015-03-31: 15:00:00

## 2015-04-02 MED ORDER — SODIUM CHLORIDE 0.9 % IJ SOLN
10.0000 mL | Freq: Two times a day (BID) | INTRAMUSCULAR | Status: DC
  Administered 2015-04-02 – 2015-04-03 (×3): 10 mL

## 2015-04-02 MED ORDER — SODIUM CHLORIDE 0.9 % IJ SOLN: 10.0000 mL | INTRAMUSCULAR | Status: DC | PRN

## 2015-04-02 NOTE — Progress Notes (Addendum)
[facility] PROGRESS NOTE Interim History: 49 year old female with morbid obesity, obstructive sleep apnea on CPAP, nonischemic cardiomyopathy, chronic systolic and diastolic CHF, EF 16-10% with grade 3 diastolic dysfunction per echo in March 2016, GERD, diabetes mellitus on insulin presented to ED with syncopal episode and progressive worsening shortness of breath.  She did have a feeling of dizziness and lightheadedness prior to the syncopal episode. Was found to be hypokalemic and AICD interrogation determine she was shocked.  Filed Weights   03/31/15 0500 04/01/15 0500 04/02/15 0500  Weight: 126.735 kg (279 lb 6.4 oz) 122.7 kg (270 lb 8.1 oz) 122.2 kg (269 lb 6.4 oz)        Intake/Output Summary (Last 24 hours) at 04/02/15 9604 Last data filed at 04/02/15 0700  Gross per 24 hour  Intake 1439.5 ml  Output   4236 ml  Net -2796.5 ml     Assessment/Plan: Syncope likely due to V. tach per AICD: She was shock after syncopal episode, cardiology was consulted and recommended to increase amiodarone to 400 mg twice a day. No further events on telemetry. In the setting of hypokalemia.  Acute on chronic combined systolic and diastolic heart failure: On high-dose diuretics with improved diuresis after RHC with the addition of metolazone. Advanced heart failure teams continues to follow. RHC on 7.21.2016. Has been laying in bed several day probably due to dizziness PT to work with her. PICC line 7.22.2016. i am hesitant to increase her medication due to her recent dizziness upon standing, check orthostatics.  Insulin-dependent diabetes mellitus: With several episodes of hypoglycemia in the hospital Lantus was reduced to half. Her A1c was 7.5. Her BG has improved and cont to hold steady.  GERD: Continue Protonix.  Essential hypertension: Continue Coreg, losartan, metolazone and Lasix.  Her blood pressure cont to improve with diuresis.  Atypical chest pain: Narrowing CT  imaging of the chest negative for PE. No further CP.  Morbid obesity: Counseling.  Code Status: full Family Communication: none  Disposition Plan: home in 2-4 days   Consultants:  Cardiology  Procedures: ECHO: none   Antibiotics:  None  HPI/Subjective: Feels tired.  Objective: Filed Vitals:   04/02/15 0500 04/02/15 0547 04/02/15 0600 04/02/15 0700  BP: 123/62  130/68 119/54  Pulse: 40 40 80 40  Temp: 97.7 F (36.5 C) 97.5 F (36.4 C) 97.7 F (36.5 C) 97.3 F (36.3 C)  TempSrc:      Resp: 20 19 20 17   Height:      Weight: 122.2 kg (269 lb 6.4 oz)     SpO2: 99% 99% 100% 97%     Exam:  General: Alert, awake, oriented x3, in no acute distress.  HEENT: No bruits, no goiter.  Heart: Regular rate and rhythm.  Lungs: Good air movement, clear Abdomen: Soft, nontender, nondistended, positive bowel sounds.  Neuro: Grossly intact, nonfocal.   Data Reviewed: Basic Metabolic Panel:  Recent Labs Lab 03/26/15 1633  03/27/15 2223  03/30/15 0316 03/31/15 0301 03/31/15 1114 03/31/15 1207 04/01/15 0356 04/01/15 1650 04/02/15 0510  NA 138  < > 136  < > 132* 135  --  134* 137 132* 135  K 3.7  < > 4.4  < > 4.0 5.0  --  4.1 3.7 4.0 4.5  CL 98*  < > 91*  < > 88* 91*  --  90* 85* 81* 84*  CO2 29  < > 34*  < > 36* 31  --  32 41* 41* 42*  GLUCOSE  180*  < > 154*  < > 150* 158*  --  156* 125* 171* 135*  BUN 14  < > 19  < > 24* 31*  --  34* 31* 31* 32*  CREATININE 1.20*  < > 1.34*  < > 1.18* 1.43*  --  1.43* 1.36* 1.37* 1.51*  CALCIUM 9.0  < > 9.1  < > 8.5* 9.1  --  9.1 9.0 8.7* 9.0  MG 1.8  --  2.0  --  2.0  --  2.2  --   --   --  2.0  < > = values in this interval not displayed. Liver Function Tests:  Recent Labs Lab 03/26/15 1633  AST 32  ALT 23  ALKPHOS 102  BILITOT 0.9  PROT 7.9  ALBUMIN 3.4*   No results for input(s): LIPASE, AMYLASE in the last 168 hours. No results for input(s): AMMONIA in the last 168 hours. CBC:  Recent Labs Lab 03/26/15 1633  03/31/15 1207  WBC 5.7 5.2  HGB 14.4 15.0  HCT 44.3 46.0  MCV 93.9 93.7  PLT 231 239   Cardiac Enzymes:  Recent Labs Lab 03/26/15 2125 03/27/15 0245 03/27/15 0855  TROPONINI <0.03 <0.03 0.05*   BNP (last 3 results)  Recent Labs  01/20/15 0040 01/26/15 1100 03/26/15 1633  BNP 166.0* 223.8* 549.5*    ProBNP (last 3 results)  Recent Labs  08/04/14 2339 08/21/14 1117  PROBNP 1727.0* 171.9*    CBG:  Recent Labs Lab 03/31/15 1824 04/01/15 0755 04/01/15 1144 04/01/15 1730 04/01/15 2129  GLUCAP 113* 127* 150* 177* 156*    No results found for this or any previous visit (from the past 240 hour(s)).   Studies: No results found.  Scheduled Meds: . allopurinol  300 mg Oral Daily  . amiodarone  400 mg Oral BID  . antiseptic oral rinse  7 mL Mouth Rinse BID  . aspirin EC  81 mg Oral Daily  . digoxin  0.0625 mg Oral Daily  . docusate sodium  200 mg Oral BID  . DULoxetine  30 mg Oral Daily  . ferrous sulfate  325 mg Oral TID  . furosemide  80 mg Intravenous BID  . gabapentin  600 mg Oral QID  . heparin  5,000 Units Subcutaneous 3 times per day  . insulin aspart  0-5 Units Subcutaneous QHS  . insulin aspart  0-9 Units Subcutaneous TID WC  . insulin detemir  40 Units Subcutaneous BID  . ivabradine  2.5 mg Oral BID WC  . Linaclotide  290 mcg Oral Daily  . losartan  12.5 mg Oral QPM  . magnesium oxide  400 mg Oral Daily  . metolazone  5 mg Oral Daily  . neomycin-bacitracin-polymyxin   Topical BID  . pantoprazole  40 mg Oral Daily  . potassium chloride  40 mEq Oral TID  . sodium chloride  3 mL Intravenous Q12H  . sodium chloride  3 mL Intravenous Q12H  . sodium chloride  3 mL Intravenous Q12H  . spironolactone  25 mg Oral BID  . temazepam  30 mg Oral QHS  . vitamin E  400 Units Oral Daily   Continuous Infusions: . milrinone 0.25 mcg/kg/min (04/01/15 2300)     Marinda Elk  [facility] Pager [phone] If 7PM-7AM, please contact  night-coverage at [email or web address], password [facility] 04/02/2015, 7:29 AM  LOS: 6 days

## 2015-04-02 NOTE — Progress Notes (Signed)
Advanced Heart Failure Rounding Note  PCP: Lorenza Evangelist Primary Cardiologist: [name]  Subjective:    RHC 03/31/15, Doing better on milrinone. No SOB at rest. Has not been moving around enough to comment on DOE but PT to see today. She is still getting dizzy occasionally, but states it is coming less often and is less severe.  Out 2.8 L past 24 hrs on IV lasix 80 mg q8hrs. Weight down 1 lbs (10 lbs over past two days).  On Milrinone at 0.25 after RHC. Coox 63.5% this morning. CVP 11-12.   04/02/15  CVP 12 PAP 81/29 (46) PCWP 26 CO 4.1 CI 1.9 SVR 1394  PVR 4.8 WU  RHC 03/31/15 RA = 28 RV = 83/19/30 PA = 86/38 (65) PCW = 36 Fick cardiac output/index = 2.9/1.3 Thermo cardiac output/index = 3.1/1.3 PVR = 10 Arterial sat = 99% PA sat = 45%, 48%   Objective:   Weight Range: 269 lb 6.4 oz (122.2 kg) Body mass index is 44.83 kg/(m^2).   Vital Signs:   Temp:  [97.2 F (36.2 C)-98.1 F (36.7 C)] 97.3 F (36.3 C) (07/22 0700) Pulse Rate:  [40-80] 40 (07/22 0700) Resp:  [12-22] 17 (07/22 0700) BP: (104-142)/(54-92) 119/54 mmHg (07/22 0700) SpO2:  [92 %-100 %] 97 % (07/22 0700) Weight:  [269 lb 6.4 oz (122.2 kg)] 269 lb 6.4 oz (122.2 kg) (07/22 0500) Last BM Date: 04/01/15  Weight change: Filed Weights   03/31/15 0500 04/01/15 0500 04/02/15 0500  Weight: 279 lb 6.4 oz (126.735 kg) 270 lb 8.1 oz (122.7 kg) 269 lb 6.4 oz (122.2 kg)    Intake/Output:   Intake/Output Summary (Last 24 hours) at 04/02/15 0730 Last data filed at 04/02/15 0700  Gross per 24 hour  Intake 1439.5 ml  Output   4236 ml  Net -2796.5 ml     Physical Exam: General: Obese. No resp difficulty. NAD HEENT: Abrasion below left eye from fall Neck: supple. RIJ swan JVP difficult to assess 2/2 girth. Carotids 2+ bilat; no bruits. No lymphadenopathy or thryomegaly noted. Cor: PMI nondisplaced. Regular rate & rhythm with frequent ectopy. No rubs. 2/6 HSM LLSB, ?S3 Lungs: Diminished bilaterally.  No wheezes or crackles noted. Abdomen: marked central obesity, nontender, mild distention. No hepatosplenomegaly. No bruits or masses. Good bowel sounds. Extremities: no cyanosis, clubbing, rash. 1+ edema to mid calf bilaterally. Neuro: alert & orientedx3, cranial nerves grossly intact. moves all 4 extremities w/o difficulty. Affect flat   Telemetry: A sensed V paced in 80s. Occasional PVCs.  Labs: CBC  Recent Labs  03/31/15 1207  WBC 5.2  HGB 15.0  HCT 46.0  MCV 93.7  PLT 239   Basic Metabolic Panel  Recent Labs  03/31/15 1114  04/01/15 1650 04/02/15 0510  NA  --   < > 132* 135  K  --   < > 4.0 4.5  CL  --   < > 81* 84*  CO2  --   < > 41* 42*  GLUCOSE  --   < > 171* 135*  BUN  --   < > 31* 32*  CALCIUM  --   < > 8.7* 9.0  MG 2.2  --   --  2.0  < > = values in this interval not displayed. Liver Function Tests No results for input(s): AST, ALT, ALKPHOS, BILITOT, PROT, ALBUMIN in the last 72 hours. No results for input(s): LIPASE, AMYLASE in the last 72 hours. Cardiac Enzymes No results for input(s): CKTOTAL, CKMB,  CKMBINDEX, TROPONINI in the last 72 hours.  BNP: BNP (last 3 results)  Recent Labs  01/20/15 0040 01/26/15 1100 03/26/15 1633  BNP 166.0* 223.8* 549.5*    ProBNP (last 3 results)  Recent Labs  08/04/14 2339 08/21/14 1117  PROBNP 1727.0* 171.9*     D-Dimer No results for input(s): DDIMER in the last 72 hours. Hemoglobin A1C No results for input(s): HGBA1C in the last 72 hours. Fasting Lipid Panel No results for input(s): CHOL, HDL, LDLCALC, TRIG, CHOLHDL, LDLDIRECT in the last 72 hours. Thyroid Function Tests No results for input(s): TSH, T4TOTAL, T3FREE, THYROIDAB in the last 72 hours.  Invalid input(s): FREET3  Other results:     Imaging/Studies:  No results found.  Latest Echo  Latest Cath   Medications:     Scheduled Medications: . allopurinol  300 mg Oral Daily  . amiodarone  400 mg Oral BID  . antiseptic oral  rinse  7 mL Mouth Rinse BID  . aspirin EC  81 mg Oral Daily  . digoxin  0.0625 mg Oral Daily  . docusate sodium  200 mg Oral BID  . DULoxetine  30 mg Oral Daily  . ferrous sulfate  325 mg Oral TID  . furosemide  80 mg Intravenous BID  . gabapentin  600 mg Oral QID  . heparin  5,000 Units Subcutaneous 3 times per day  . insulin aspart  0-5 Units Subcutaneous QHS  . insulin aspart  0-9 Units Subcutaneous TID WC  . insulin detemir  40 Units Subcutaneous BID  . ivabradine  2.5 mg Oral BID WC  . Linaclotide  290 mcg Oral Daily  . losartan  12.5 mg Oral QPM  . magnesium oxide  400 mg Oral Daily  . metolazone  5 mg Oral Daily  . neomycin-bacitracin-polymyxin   Topical BID  . pantoprazole  40 mg Oral Daily  . potassium chloride  40 mEq Oral TID  . sodium chloride  3 mL Intravenous Q12H  . sodium chloride  3 mL Intravenous Q12H  . sodium chloride  3 mL Intravenous Q12H  . spironolactone  25 mg Oral BID  . temazepam  30 mg Oral QHS  . vitamin E  400 Units Oral Daily    Infusions: . milrinone 0.25 mcg/kg/min (04/01/15 2300)    PRN Medications: sodium chloride, sodium chloride, sodium chloride, acetaminophen, albuterol, cyclobenzaprine, ondansetron (ZOFRAN) IV, oxyCODONE-acetaminophen, sodium chloride, sodium chloride, sodium chloride   Assessment/Plan   1. 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 - ARB, ivabradine, spiro, and dig - Continues to be overloaded - BB held in setting of cardiogenic shock 2. Atypical CP - Trop negative. Normal cors on cath 4/15 3. VF with ICD resuscitation - by ICD interrogation  - Initially thought to be syncope, determined to be VF with ICD shock resuscitation. 4. CKD Stage 3- slight bump Cr 1.38 -> 1.18 -> 1.43 -> 1.37 -> 1.51. Will follow closely. 5. GERD 6. DM2 7. OSA with CPAP 8. Morbid obesity 9. AKI 10. Frequent PVCs- K 4.5, Mg 2.0, Will follow. 11. Urinary retention - Bladder scan 03/30/15 showed 273 cc. Pt was then able to pass  300 cc with 0 cc residual. 12. H/o VT in setting of hypokalemia 4/16 - Cont amio 400 BID. K 4.5. No further VT.  To have PICC placed today for home milrinone.Coox 63.6 and CVP 11-12.  Continue IV diuresis. Given size and comorbidities she is poor candidate for other advanced therapies. Continue to supplement  electrolytes as needed with goal of K > 4.0 and Mg > 2.0.   Length of Stay: 6   [name] PA-C 04/02/2015, 7:30 AM  Advanced Heart Failure Team Pager [phone] (M-F; 7a - 4p)  Please contact [facility] for night-coverage after hours (4p -7a ) and weekends on [email or web address]  Patient seen and examined with [name], PA-C. We discussed all aspects of the encounter. I agree with the assessment and plan as stated above.   Hemodynamics still marginal on milrinone 0.25. Will increase to 0.375. Continue to diurese. May benefit from sildenafil. Home milrinone being arranged. Options very limited. Has h/o VT with low K+. Supp electrolytes aggressively.   The patient is critically ill with multiple organ systems failure and requires high complexity decision making for assessment and support, frequent evaluation and titration of therapies, application of advanced monitoring technologies and extensive interpretation of multiple databases.   Critical Care Time devoted to patient care services described in this note is 35 Minutes.   Bensimhon, Daniel,MD 6:00 PM

## 2015-04-02 NOTE — Progress Notes (Signed)
Peripherally Inserted Central Catheter/Midline Placement  The IV Nurse has discussed with the patient and/or persons authorized to consent for the patient, the purpose of this procedure and the potential benefits and risks involved with this procedure.  The benefits include less needle sticks, lab draws from the catheter and patient may be discharged home with the catheter.  Risks include, but not limited to, infection, bleeding, blood clot (thrombus formation), and puncture of an artery; nerve damage and irregular heat beat.  Alternatives to this procedure were also discussed.  PICC/Midline Placement Documentation  PICC / Midline Double Lumen 04/02/15 PICC Right Basilic 42 cm 0 cm (Active)  Indication for Insertion or Continuance of Line Home intravenous therapies (PICC only) 04/02/2015  6:00 PM  Exposed Catheter (cm) 0 cm 04/02/2015  6:00 PM  Dressing Change Due 04/09/15 04/02/2015  6:00 PM       Stacie Glaze Horton 04/02/2015, 6:16 PM

## 2015-04-02 NOTE — Evaluation (Signed)
Physical Therapy Evaluation Patient Details Name: [name] MRN: [id number] DOB: [birth year] Today's Date: 04/02/2015   History of Present Illness  49 year old female with morbid obesity, obstructive sleep apnea on CPAP, nonischemic cardiomyopathy, chronic systolic and diastolic CHF, EF 34-19% with grade 3 diastolic dysfunction per echo in March 2016, GERD, diabetes mellitus on insulin presented to ED with syncopal episode and progressive worsening shortness of breath. Syncopal episode due to V-tach catch by AICD. With ADHF on IV milrinoe and diuretics  Clinical Impression  Pt very pleasant and eager to mobilize. No dizziness or orthostatic hypotension today and able to maintain sats >90% on RA throughout. Pt will benefit from acute therapy to maximize mobility, gait and activity to return to mod I functional level for safe D/C. Recommend daily ambulation with nursing staff.     Follow Up Recommendations No PT follow up    Equipment Recommendations  None recommended by PT    Recommendations for Other Services       Precautions / Restrictions Precautions Precautions: Fall      Mobility  Bed Mobility Overal bed mobility: Modified Independent             General bed mobility comments: increased time  Transfers Overall transfer level: Needs assistance   Transfers: Sit to/from Stand Sit to Stand: Supervision         General transfer comment: cues for safety and hand placement  Ambulation/Gait Ambulation/Gait assistance: Min guard Ambulation Distance (Feet): 170 Feet Assistive device: Rolling walker (2 wheeled) Gait Pattern/deviations: Step-through pattern;Decreased stride length   Gait velocity interpretation: Below normal speed for age/gender General Gait Details: cues for position in RW and directional cues  Stairs            Wheelchair Mobility    Modified Rankin (Stroke Patients Only)       Balance                                             Pertinent Vitals/Pain Pain Assessment: No/denies pain  Orthostatic BPs  Supine 124/66  Sitting 130/82     Standing 140/95     sats 90-99% on RA HR 83-85     Home Living Family/patient expects to be discharged to:: Private residence Living Arrangements: Children;Spouse/significant other Available Help at Discharge: Family;Available 24 hours/day Type of Home: Apartment Home Access: Stairs to enter   Entrance Stairs-Number of Steps: 1 Home Layout: One level Home Equipment: Walker - 2 wheels;Cane - single point;Tub bench Additional Comments: Pt has ramp to apt, then one step up into doorway    Prior Function Level of Independence: Independent with assistive device(s)         Comments: Lives with 68 daughter and fiance'. Marchia Meiers' is self employed and able to assist as needed.      Hand Dominance        Extremity/Trunk Assessment   Upper Extremity Assessment: Generalized weakness           Lower Extremity Assessment: Generalized weakness      Cervical / Trunk Assessment: Normal  Communication   Communication: No difficulties  Cognition Arousal/Alertness: Awake/alert Behavior During Therapy: WFL for tasks assessed/performed Overall Cognitive Status: Within Functional Limits for tasks assessed                      General Comments  Exercises        Assessment/Plan    PT Assessment Patient needs continued PT services  PT Diagnosis Difficulty walking;Generalized weakness   PT Problem List Decreased activity tolerance;Decreased mobility;Decreased knowledge of use of DME  PT Treatment Interventions Gait training;DME instruction;Functional mobility training;Therapeutic activities;Therapeutic exercise;Patient/family education   PT Goals (Current goals can be found in the Care Plan section) Acute Rehab PT Goals Patient Stated Goal: return home, sing at church PT Goal Formulation: With patient Time For Goal Achievement:  04/16/15 Potential to Achieve Goals: Good    Frequency Min 3X/week   Barriers to discharge        Co-evaluation               End of Session Equipment Utilized During Treatment: Gait belt Activity Tolerance: Patient tolerated treatment well Patient left: in chair;with call bell/phone within reach;with chair alarm set;with nursing/sitter in room Nurse Communication: Mobility status         Time: 1610-9604 PT Time Calculation (min) (ACUTE ONLY): 33 min   Charges:   PT Evaluation $Initial PT Evaluation Tier I: 1 Procedure PT Treatments $Therapeutic Activity: 8-22 mins   PT G CodesDelorse [name] 04/02/2015, 8:17 AM Delaney Meigs, PT [phone]

## 2015-04-02 NOTE — Progress Notes (Signed)
CARDIAC REHAB PHASE I   PRE:  Rate/Rhythm: 84 AV pacing  BP:  Supine:   Sitting: 122/50  Standing:    SaO2: 99 2L  MODE:  Ambulation: 350 ft   POST:  Rate/Rhythm: 88 AV pacing  BP:  Supine:   Sitting: 141/92  Standing:    SaO2: 100 2L 1415-1550 Assisted X 2 and used walker to ambulate. Gait steady with walker. Pt able to walk 350 feet without c/o of painor SOB. BP after walk 141/92. Pt back to bed after walk with call light in reach. Pt is much improved today and states that she is feeling better.  Melina Copa RN 04/02/2015 2:46 PM

## 2015-04-03 DIAGNOSIS — I4901 Ventricular fibrillation: Secondary | ICD-10-CM

## 2015-04-03 LAB — BASIC METABOLIC PANEL
Anion gap: 11 (ref 5–15)
BUN: 34 mg/dL — ABNORMAL HIGH (ref 6–20)
CO2: 40 mmol/L — ABNORMAL HIGH (ref 22–32)
Calcium: 8.9 mg/dL (ref 8.9–10.3)
Chloride: 80 mmol/L — ABNORMAL LOW (ref 101–111)
Creatinine, Ser: 1.23 mg/dL — ABNORMAL HIGH (ref 0.44–1.00)
GFR calc Af Amer: 59 mL/min — ABNORMAL LOW (ref >60)
GFR calc non Af Amer: 51 mL/min — ABNORMAL LOW (ref >60)
Glucose, Bld: 232 mg/dL — ABNORMAL HIGH (ref 65–99)
Potassium: 3.9 mmol/L (ref 3.5–5.1)
Sodium: 131 mmol/L — ABNORMAL LOW (ref 135–145)

## 2015-04-03 LAB — CARBOXYHEMOGLOBIN
Carboxyhemoglobin: 1.6 % — ABNORMAL HIGH (ref 0.5–1.5)
Methemoglobin: 1.4 % (ref 0.0–1.5)
O2 SAT: 65.7 %
TOTAL HEMOGLOBIN: 14.8 g/dL (ref 12.0–16.0)

## 2015-04-03 LAB — DIGOXIN LEVEL: DIGOXIN LVL: 0.4 ng/mL — AB (ref 0.8–2.0)

## 2015-04-03 LAB — GLUCOSE, CAPILLARY
GLUCOSE-CAPILLARY: 206 mg/dL — AB (ref 65–99)
GLUCOSE-CAPILLARY: 203 mg/dL — AB (ref 65–99)
Glucose-Capillary: 189 mg/dL — ABNORMAL HIGH (ref 65–99)
Glucose-Capillary: 153 mg/dL — ABNORMAL HIGH (ref 65–99)

## 2015-04-03 LAB — MAGNESIUM: Magnesium: 2.1 mg/dL (ref 1.7–2.4)

## 2015-04-03 MED ORDER — POTASSIUM CHLORIDE CRYS ER 20 MEQ PO TBCR: 20.0000 meq | EXTENDED_RELEASE_TABLET | Freq: Once | ORAL | Status: DC

## 2015-04-03 MED ORDER — INSULIN DETEMIR 100 UNIT/ML ~~LOC~~ SOLN
50.0000 [IU] | Freq: Two times a day (BID) | SUBCUTANEOUS | Status: DC
  Administered 2015-04-03 (×2): 50 [IU] via SUBCUTANEOUS
  Filled 2015-04-03 (×4): qty 0.5

## 2015-04-03 MED ORDER — POTASSIUM CHLORIDE CRYS ER 20 MEQ PO TBCR
40.0000 meq | EXTENDED_RELEASE_TABLET | Freq: Once | ORAL | Status: AC
  Administered 2015-04-03: 40 meq via ORAL

## 2015-04-03 MED ORDER — TORSEMIDE 20 MG PO TABS
80.0000 mg | ORAL_TABLET | Freq: Two times a day (BID) | ORAL | Status: DC
  Administered 2015-04-03 – 2015-04-04 (×3): 80 mg via ORAL
  Filled 2015-04-03 (×6): qty 4

## 2015-04-03 NOTE — Progress Notes (Addendum)
[facility] PROGRESS NOTE Interim History: 49 year old female with morbid obesity, obstructive sleep apnea on CPAP, nonischemic cardiomyopathy, chronic systolic and diastolic CHF, EF 40-98% with grade 3 diastolic dysfunction per echo in March 2016, GERD, diabetes mellitus on insulin presented to ED with syncopal episode and progressive worsening shortness of breath. Syncopal episode due to V-tach catch by AICD. With ADHF on IV milrinoe and diuretics.  Filed Weights   04/01/15 0500 04/02/15 0500 04/03/15 0500  Weight: 122.7 kg (270 lb 8.1 oz) 122.2 kg (269 lb 6.4 oz) 119.432 kg (263 lb 4.8 oz)        Intake/Output Summary (Last 24 hours) at 04/03/15 1191 Last data filed at 04/03/15 0600  Gross per 24 hour  Intake 1084.3 ml  Output   5275 ml  Net -4190.7 ml     Assessment/Plan: Syncope likely due to V. tach per AICD: She was resuscitated with an ICD shock, cardiology was consulted and recommended to increase amiodarone to 400 mg twice a day. No further events on telemetry. In the setting of hypokalemia.  Acute on chronic combined systolic and diastolic heart failure/Cardiogenic shock: On lasix and metolazone with good diuresis 4.0L negative in the last 24 hrs. Weight cont to improve now with 117.4 kg. RHC performed now on milrinone CVP 16->7. Orthostatics negative. Cont lytes replacement. Cr improving. Advance HF team recommended Sindenafil. Will need home milrinone.  Insulin-dependent diabetes mellitus: With several episodes of hypoglycemia on admission, ? Due to worsening renal function. Her A1c was 7.5. BG is trending up cont titrate long acting.  GERD: Continue Protonix.  Essential hypertension: Continue Coreg, losartan, metolazone and Lasix.  Her blood pressure is close to goal.  Atypical chest pain: CT imaging of the chest negative for PE, ? Due to ADHF Morbid obesity: Counseling.  Code Status: full Family Communication: none  Disposition Plan: home  in 3-5 days   Consultants:  cardiology  Procedures: ECHO: none   Antibiotics:  None  HPI/Subjective: No complains, d/w pt the need for medication complaince.  Objective: Filed Vitals:   04/03/15 0300 04/03/15 0400 04/03/15 0500 04/03/15 0600  BP: 129/65 132/73 116/63 124/66  Pulse: 41 41 40 40  Temp:  97.7 F (36.5 C)    TempSrc:  Oral    Resp: Height:      Weight:   119.432 kg (263 lb 4.8 oz)   SpO2: 98% 98% 97% 96%     Exam:  General: Alert, awake, oriented x3, in no acute distress.  HEENT: No bruits, no goiter.  Heart: Regular rate and rhythm.  Lungs: Good air movement, clear Abdomen: Soft, nontender, nondistended, positive bowel sounds.  Neuro: Grossly intact, nonfocal.   Data Reviewed: Basic Metabolic Panel:  Recent Labs Lab 03/27/15 2223  03/30/15 0316  03/31/15 1114 03/31/15 1207 04/01/15 0356 04/01/15 1650 04/02/15 0510 04/03/15 0523  NA 136  < > 132*  < >  --  134* 137 132* 135 131*  K 4.4  < > 4.0  < >  --  4.1 3.7 4.0 4.5 3.9  CL 91*  < > 88*  < >  --  90* 85* 81* 84* 80*  CO2 34*  < > 36*  < >  --  32 41* 41* 42* 40*  GLUCOSE 154*  < > 150*  < >  --  156* 125* 171* 135* 232*  BUN 19  < > 24*  < >  --  34* 31* 31* 32* 34*  CREATININE 1.34*  < > 1.18*  < >  --  1.43* 1.36* 1.37* 1.51* 1.23*  CALCIUM 9.1  < > 8.5*  < >  --  9.1 9.0 8.7* 9.0 8.9  MG 2.0  --  2.0  --  2.2  --   --   --  2.0 2.1  < > = values in this interval not displayed. Liver Function Tests: No results for input(s): AST, ALT, ALKPHOS, BILITOT, PROT, ALBUMIN in the last 168 hours. No results for input(s): LIPASE, AMYLASE in the last 168 hours. No results for input(s): AMMONIA in the last 168 hours. CBC:  Recent Labs Lab 03/31/15 1207  WBC 5.2  HGB 15.0  HCT 46.0  MCV 93.7  PLT 239   Cardiac Enzymes:  Recent Labs Lab 03/27/15 0855  TROPONINI 0.05*   BNP (last 3 results)  Recent Labs  01/20/15 0040 01/26/15 1100 03/26/15 1633  BNP  166.0* 223.8* 549.5*    ProBNP (last 3 results)  Recent Labs  08/04/14 2339 08/21/14 1117  PROBNP 1727.0* 171.9*    CBG:  Recent Labs Lab 04/01/15 2129 04/02/15 0815 04/02/15 1126 04/02/15 1854 04/02/15 2107  GLUCAP 156* 128* 161* 226* 163*    Recent Results (from the past 240 hour(s))  MRSA PCR Screening     Status: None   Collection Time: 04/02/15 11:45 AM  Result Value Ref Range Status   MRSA by PCR NEGATIVE NEGATIVE Final    Comment:        The GeneXpert MRSA Assay (FDA approved for NASAL specimens only), is one component of a comprehensive MRSA colonization surveillance program. It is not intended to diagnose MRSA infection nor to guide or monitor treatment for MRSA infections.      Studies: Dg Chest Port 1 View  04/02/2015   CLINICAL DATA:  49 year old female status post central line placement. Check position.  EXAM: PORTABLE CHEST - 1 VIEW  COMPARISON:  Chest radiograph dated 03/26/2015 and CT dated 03/27/2015.  FINDINGS: Right-sided PICC is noted with tip over central SVC. There is moderate cardiomegaly. No consolidation, pleural effusion, or pneumothorax. Left pectoral AICD device.  IMPRESSION: Right-sided PICC with tip over central SVC.   Electronically Signed   By: Elgie Collard M.D.   On: 04/02/2015 18:41    Scheduled Meds: . allopurinol  300 mg Oral Daily  . amiodarone  400 mg Oral BID  . antiseptic oral rinse  7 mL Mouth Rinse BID  . aspirin EC  81 mg Oral Daily  . docusate sodium  200 mg Oral BID  . DULoxetine  30 mg Oral Daily  . ferrous sulfate  325 mg Oral TID  . furosemide  80 mg Intravenous BID  . gabapentin  600 mg Oral QID  . heparin  5,000 Units Subcutaneous 3 times per day  . insulin aspart  0-5 Units Subcutaneous QHS  . insulin aspart  0-9 Units Subcutaneous TID WC  . insulin detemir  40 Units Subcutaneous BID  . ivabradine  2.5 mg Oral BID WC  . Linaclotide  290 mcg Oral Daily  . losartan  12.5 mg Oral QPM  . magnesium oxide   400 mg Oral Daily  . metolazone  5 mg Oral Daily  . neomycin-bacitracin-polymyxin   Topical BID  . pantoprazole  40 mg Oral Daily  . potassium chloride  40 mEq Oral TID  . sodium chloride  10-40 mL Intracatheter Q12H  . sodium chloride  3 mL Intravenous Q12H  . sodium  chloride  3 mL Intravenous Q12H  . sodium chloride  3 mL Intravenous Q12H  . spironolactone  25 mg Oral BID  . temazepam  30 mg Oral QHS  . vitamin E  400 Units Oral Daily   Continuous Infusions: . milrinone 0.375 mcg/kg/min (04/03/15 0255)     Marinda Elk  [facility] Pager [phone] If 7PM-7AM, please contact night-coverage at [email or web address], password [facility] 04/03/2015, 7:12 AM  LOS: 7 days

## 2015-04-03 NOTE — Progress Notes (Signed)
Advanced Heart Failure Rounding Note  PCP: Lorenza Evangelist Primary Cardiologist: [name]  Subjective:    RHC 03/31/15, Doing better on milrinone. No SOB at rest. Has not been moving around enough to comment on DOE but PT to see today. She is still getting dizzy occasionally, but states it is coming less often and is less severe. .  Milrinone increased to 0.375 yesterday . Coox 66% this morning. Creatinine improved. CVP 7. Feels good. Weight down another 6 pounds. (16 pounds total)   RHC 03/31/15 RA = 28 RV = 83/19/30 PA = 86/38 (65) PCW = 36 Fick cardiac output/index = 2.9/1.3 Thermo cardiac output/index = 3.1/1.3 PVR = 10 Arterial sat = 99% PA sat = 45%, 48%   Objective:   Weight Range: 119.432 kg (263 lb 4.8 oz) Body mass index is 43.82 kg/(m^2).   Vital Signs:   Temp:  [97.3 F (36.3 C)-98.1 F (36.7 C)] 97.9 F (36.6 C) (07/23 1100) Pulse Rate:  [40-42] 41 (07/23 1100) Resp:  [16-40] 18 (07/23 1100) BP: (111-139)/(52-83) 116/82 mmHg (07/23 1100) SpO2:  [95 %-100 %] 95 % (07/23 1100) Weight:  [119.432 kg (263 lb 4.8 oz)] 119.432 kg (263 lb 4.8 oz) (07/23 0500) Last BM Date: 04/02/15  Weight change: Filed Weights   04/01/15 0500 04/02/15 0500 04/03/15 0500  Weight: 122.7 kg (270 lb 8.1 oz) 122.2 kg (269 lb 6.4 oz) 119.432 kg (263 lb 4.8 oz)    Intake/Output:   Intake/Output Summary (Last 24 hours) at 04/03/15 1405 Last data filed at 04/03/15 0600  Gross per 24 hour  Intake  497.8 ml  Output   3675 ml  Net -3177.2 ml     Physical Exam: General: Obese. No resp difficulty. NAD HEENT: Abrasion below left eye from fall Neck: supple. RIJ swan JVP difficult to assess 2/2 girth. Carotids 2+ bilat; no bruits. No lymphadenopathy or thryomegaly noted. Cor: PMI nondisplaced. Regular rate & rhythm with frequent ectopy. No rubs. 2/6 HSM LLSB, ?S3 Lungs: Diminished bilaterally.clear Abdomen: marked central obesity, nontender, mild distention. No  hepatosplenomegaly. No bruits or masses. Good bowel sounds. Extremities: no cyanosis, clubbing, rash. Trace  edema to mid calf bilaterally. Neuro: alert & orientedx3, cranial nerves grossly intact. moves all 4 extremities w/o difficulty. Affect flat   Telemetry: A sensed V paced in 80s. Frequent PVCs  Labs: CBC No results for input(s): WBC, NEUTROABS, HGB, HCT, MCV, PLT in the last 72 hours. Basic Metabolic Panel  Recent Labs  04/02/15 0510 04/03/15 0523  NA 135 131*  K 4.5 3.9  CL 84* 80*  CO2 42* 40*  GLUCOSE 135* 232*  BUN 32* 34*  CALCIUM 9.0 8.9  MG 2.0 2.1   Liver Function Tests No results for input(s): AST, ALT, ALKPHOS, BILITOT, PROT, ALBUMIN in the last 72 hours. No results for input(s): LIPASE, AMYLASE in the last 72 hours. Cardiac Enzymes No results for input(s): CKTOTAL, CKMB, CKMBINDEX, TROPONINI in the last 72 hours.  BNP: BNP (last 3 results)  Recent Labs  01/20/15 0040 01/26/15 1100 03/26/15 1633  BNP 166.0* 223.8* 549.5*    ProBNP (last 3 results)  Recent Labs  08/04/14 2339 08/21/14 1117  PROBNP 1727.0* 171.9*     D-Dimer No results for input(s): DDIMER in the last 72 hours. Hemoglobin A1C No results for input(s): HGBA1C in the last 72 hours. Fasting Lipid Panel No results for input(s): CHOL, HDL, LDLCALC, TRIG, CHOLHDL, LDLDIRECT in the last 72 hours. Thyroid Function Tests No results for input(s): TSH,  T4TOTAL, T3FREE, THYROIDAB in the last 72 hours.  Invalid input(s): FREET3  Other results:     Imaging/Studies:  Dg Chest Port 1 View  04/02/2015   CLINICAL DATA:  49 year old female status post central line placement. Check position.  EXAM: PORTABLE CHEST - 1 VIEW  COMPARISON:  Chest radiograph dated 03/26/2015 and CT dated 03/27/2015.  FINDINGS: Right-sided PICC is noted with tip over central SVC. There is moderate cardiomegaly. No consolidation, pleural effusion, or pneumothorax. Left pectoral AICD device.  IMPRESSION:  Right-sided PICC with tip over central SVC.   Electronically Signed   By: Elgie Collard M.D.   On: 04/02/2015 18:41    Latest Echo  Latest Cath   Medications:     Scheduled Medications: . allopurinol  300 mg Oral Daily  . amiodarone  400 mg Oral BID  . antiseptic oral rinse  7 mL Mouth Rinse BID  . aspirin EC  81 mg Oral Daily  . docusate sodium  200 mg Oral BID  . DULoxetine  30 mg Oral Daily  . ferrous sulfate  325 mg Oral TID  . furosemide  80 mg Intravenous BID  . gabapentin  600 mg Oral QID  . heparin  5,000 Units Subcutaneous 3 times per day  . insulin aspart  0-5 Units Subcutaneous QHS  . insulin aspart  0-9 Units Subcutaneous TID WC  . insulin detemir  50 Units Subcutaneous BID  . ivabradine  2.5 mg Oral BID WC  . Linaclotide  290 mcg Oral Daily  . losartan  12.5 mg Oral QPM  . magnesium oxide  400 mg Oral Daily  . metolazone  5 mg Oral Daily  . neomycin-bacitracin-polymyxin   Topical BID  . pantoprazole  40 mg Oral Daily  . potassium chloride  40 mEq Oral TID  . sodium chloride  10-40 mL Intracatheter Q12H  . sodium chloride  3 mL Intravenous Q12H  . sodium chloride  3 mL Intravenous Q12H  . sodium chloride  3 mL Intravenous Q12H  . spironolactone  25 mg Oral BID  . temazepam  30 mg Oral QHS  . vitamin E  400 Units Oral Daily    Infusions: . milrinone 0.375 mcg/kg/min (04/03/15 0809)    PRN Medications: sodium chloride, sodium chloride, sodium chloride, acetaminophen, albuterol, cyclobenzaprine, ondansetron (ZOFRAN) IV, oxyCODONE-acetaminophen, sodium chloride, sodium chloride, sodium chloride, sodium chloride   Assessment/Plan   1. 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 - ARB, ivabradine, spiro, and dig - Continues to be overloaded - BB held in setting of cardiogenic shock 2. Atypical CP - Trop negative. Normal cors on cath 4/15 3. VF with ICD resuscitation - by ICD interrogation  - Initially thought to be syncope, determined to be VF  with ICD shock resuscitation. 4. CKD Stage 3- slight bump Cr 1.38 -> 1.18 -> 1.43 -> 1.37 -> 1.51. Will follow closely. 5. GERD 6. DM2 7. OSA with CPAP 8. Morbid obesity 9. AKI 10. Frequent PVCs- K 4.5, Mg 2.0, Will follow. 11. Urinary retention - Bladder scan 03/30/15 showed 273 cc. Pt was then able to pass 300 cc with 0 cc residual. 12. H/o VT in setting of hypokalemia 4/16 - Cont amio 400 BID. K 4.5. No further VT.  Much improved on increased dose of milrinone and with extra diuresis. Will switch lasix to po torsemide. Continue amio for VT/VF and frequent PVCs. Likely home Monday with milrinone. Continue to supplement electrolytes as needed with goal of K >  4.0 and Mg > 2.0. Can got to SDU. Remove foley.   Length of Stay: 7   Onyinyechi Huante MD  04/03/2015, 2:05 PM Advanced Heart Failure Team Pager [phone] (M-F; 7a - 4p)  Please contact [facility] for night-coverage after hours (4p -7a ) and weekends on [email or web address]

## 2015-04-04 LAB — BASIC METABOLIC PANEL
ANION GAP: 9 (ref 5–15)
BUN: 37 mg/dL — ABNORMAL HIGH (ref 6–20)
CHLORIDE: 82 mmol/L — AB (ref 101–111)
CO2: 40 mmol/L — AB (ref 22–32)
Calcium: 8.9 mg/dL (ref 8.9–10.3)
Creatinine, Ser: 1.33 mg/dL — ABNORMAL HIGH (ref 0.44–1.00)
GFR calc non Af Amer: 46 mL/min — ABNORMAL LOW (ref >60)
GFR, EST AFRICAN AMERICAN: 53 mL/min — AB (ref >60)
GLUCOSE: 166 mg/dL — AB (ref 65–99)
POTASSIUM: 4.6 mmol/L (ref 3.5–5.1)
SODIUM: 131 mmol/L — AB (ref 135–145)

## 2015-04-04 LAB — CARBOXYHEMOGLOBIN
Carboxyhemoglobin: 1.7 % — ABNORMAL HIGH (ref 0.5–1.5)
Methemoglobin: 1.3 % (ref 0.0–1.5)
O2 Saturation: 71.3 %
TOTAL HEMOGLOBIN: 15.8 g/dL (ref 12.0–16.0)

## 2015-04-04 LAB — GLUCOSE, CAPILLARY
GLUCOSE-CAPILLARY: 176 mg/dL — AB (ref 65–99)
GLUCOSE-CAPILLARY: 191 mg/dL — AB (ref 65–99)
GLUCOSE-CAPILLARY: 162 mg/dL — AB (ref 65–99)
GLUCOSE-CAPILLARY: 195 mg/dL — AB (ref 65–99)

## 2015-04-04 MED ORDER — INSULIN DETEMIR 100 UNIT/ML ~~LOC~~ SOLN
53.0000 [IU] | Freq: Two times a day (BID) | SUBCUTANEOUS | Status: DC
  Administered 2015-04-04 (×2): 53 [IU] via SUBCUTANEOUS
  Filled 2015-04-04 (×3): qty 0.53

## 2015-04-04 MED ORDER — AMIODARONE LOAD VIA INFUSION
150.0000 mg | Freq: Once | INTRAVENOUS | Status: AC
  Administered 2015-04-04: 150 mg via INTRAVENOUS
  Filled 2015-04-04: qty 83.34

## 2015-04-04 MED ORDER — AMIODARONE HCL IN DEXTROSE 360-4.14 MG/200ML-% IV SOLN
30.0000 mg/h | INTRAVENOUS | Status: DC
  Administered 2015-04-04 – 2015-04-06 (×3): 30 mg/h via INTRAVENOUS
  Filled 2015-04-04 (×8): qty 200

## 2015-04-04 MED ORDER — AMIODARONE HCL IN DEXTROSE 360-4.14 MG/200ML-% IV SOLN
60.0000 mg/h | INTRAVENOUS | Status: AC
  Administered 2015-04-04 (×2): 60 mg/h via INTRAVENOUS
  Filled 2015-04-04 (×2): qty 200

## 2015-04-04 NOTE — Progress Notes (Signed)
[facility] PROGRESS NOTE Interim History: 49 year old female with morbid obesity, obstructive sleep apnea on CPAP, nonischemic cardiomyopathy, chronic systolic and diastolic CHF, EF 17-61% with grade 3 diastolic dysfunction per echo in March 2016, GERD, diabetes mellitus on insulin presented to ED with syncopal episode and progressive worsening shortness of breath. Syncopal episode due to V-tach catch by AICD. With ADHF on IV milrinoe and diuretics.  Filed Weights   04/02/15 0500 04/03/15 0500 04/04/15 0417  Weight: 122.2 kg (269 lb 6.4 oz) 119.432 kg (263 lb 4.8 oz) 119.9 kg (264 lb 5.3 oz)        Intake/Output Summary (Last 24 hours) at 04/04/15 6073 Last data filed at 04/04/15 0500  Gross per 24 hour  Intake 1078.9 ml  Output   1500 ml  Net -421.1 ml     Assessment/Plan: Syncope likely due to V. Tach per AICD: She was resuscitated with an ICD shock, cardiology was consulted and recommended to increase amiodarone to 400 mg twice a day. Had episodes of syncope overnight, with Vtach and shock possible twice, check a mag.  Acute on chronic combined systolic and diastolic heart failure/Cardiogenic shock: Cont to be negative transition to oral torsemide. Weight 119 kg. Will consult PMT. RHC performed now on milrinone CVP 16->6. Cont lytes replacement. Cr has plateau. Advance HF team recommended Sindenafil. Will need home milrinone. If ok with cards transfer to telemetry.  Insulin-dependent diabetes mellitus: With several episodes of hypoglycemia on admission, ? Due to worsening renal function. Her A1c was 7.5. BG is improving, cont titrate long acting.  GERD: Continue Protonix.  Essential hypertension: Continue Coreg, losartan, metolazone and Lasix.  Her blood pressure is close to goal.  Atypical chest pain: CT imaging of the chest negative for PE, ? Due to ADHF.  Morbid obesity: Counseling.  Code Status: full Family Communication: none  Disposition Plan:  home in 3-5 days   Consultants:  cardiology  Procedures: ECHO: none   Antibiotics:  None  HPI/Subjective: No complains, pt emotional. We discuss her condition and limitation on treatment.  Objective: Filed Vitals:   04/04/15 0000 04/04/15 0200 04/04/15 0417 04/04/15 0425  BP: 133/64 130/72  134/110  Pulse: 82 37  39  Temp:   97.7 F (36.5 C)   TempSrc:   Oral   Resp:      Height:      Weight:   119.9 kg (264 lb 5.3 oz)   SpO2: 96% 97%  98%     Exam:  General: Alert, awake, oriented x3, in no acute distress.  HEENT: No bruits, no goiter.  Heart: Regular rate and rhythm.  Lungs: Good air movement, clear Abdomen: Soft, nontender, nondistended, positive bowel sounds.  Neuro: Grossly intact, nonfocal.   Data Reviewed: Basic Metabolic Panel:  Recent Labs Lab 03/30/15 0316  03/31/15 1114  04/01/15 0356 04/01/15 1650 04/02/15 0510 04/03/15 0523 04/04/15 0431  NA 132*  < >  --   < > 137 132* 135 131* 131*  K 4.0  < >  --   < > 3.7 4.0 4.5 3.9 4.6  CL 88*  < >  --   < > 85* 81* 84* 80* 82*  CO2 36*  < >  --   < > 41* 41* 42* 40* 40*  GLUCOSE 150*  < >  --   < > 125* 171* 135* 232* 166*  BUN 24*  < >  --   < > 31* 31* 32* 34* 37*  CREATININE 1.18*  < >  --   < >  1.36* 1.37* 1.51* 1.23* 1.33*  CALCIUM 8.5*  < >  --   < > 9.0 8.7* 9.0 8.9 8.9  MG 2.0  --  2.2  --   --   --  2.0 2.1  --   < > = values in this interval not displayed. Liver Function Tests: No results for input(s): AST, ALT, ALKPHOS, BILITOT, PROT, ALBUMIN in the last 168 hours. No results for input(s): LIPASE, AMYLASE in the last 168 hours. No results for input(s): AMMONIA in the last 168 hours. CBC:  Recent Labs Lab 03/31/15 1207  WBC 5.2  HGB 15.0  HCT 46.0  MCV 93.7  PLT 239   Cardiac Enzymes: No results for input(s): CKTOTAL, CKMB, CKMBINDEX, TROPONINI in the last 168 hours. BNP (last 3 results)  Recent Labs  01/20/15 0040 01/26/15 1100 03/26/15 1633  BNP 166.0* 223.8*  549.5*    ProBNP (last 3 results)  Recent Labs  08/04/14 2339 08/21/14 1117  PROBNP 1727.0* 171.9*    CBG:  Recent Labs Lab 04/02/15 2107 04/03/15 0739 04/03/15 1125 04/03/15 1633 04/03/15 2127  GLUCAP 163* 189* 206* 203* 153*    Recent Results (from the past 240 hour(s))  MRSA PCR Screening     Status: None   Collection Time: 04/02/15 11:45 AM  Result Value Ref Range Status   MRSA by PCR NEGATIVE NEGATIVE Final    Comment:        The GeneXpert MRSA Assay (FDA approved for NASAL specimens only), is one component of a comprehensive MRSA colonization surveillance program. It is not intended to diagnose MRSA infection nor to guide or monitor treatment for MRSA infections.      Studies: Dg Chest Port 1 View  04/02/2015   CLINICAL DATA:  49 year old female status post central line placement. Check position.  EXAM: PORTABLE CHEST - 1 VIEW  COMPARISON:  Chest radiograph dated 03/26/2015 and CT dated 03/27/2015.  FINDINGS: Right-sided PICC is noted with tip over central SVC. There is moderate cardiomegaly. No consolidation, pleural effusion, or pneumothorax. Left pectoral AICD device.  IMPRESSION: Right-sided PICC with tip over central SVC.   Electronically Signed   By: Elgie Collard M.D.   On: 04/02/2015 18:41    Scheduled Meds: . allopurinol  300 mg Oral Daily  . amiodarone  400 mg Oral BID  . antiseptic oral rinse  7 mL Mouth Rinse BID  . aspirin EC  81 mg Oral Daily  . docusate sodium  200 mg Oral BID  . DULoxetine  30 mg Oral Daily  . ferrous sulfate  325 mg Oral TID  . gabapentin  600 mg Oral QID  . heparin  5,000 Units Subcutaneous 3 times per day  . insulin aspart  0-5 Units Subcutaneous QHS  . insulin aspart  0-9 Units Subcutaneous TID WC  . insulin detemir  50 Units Subcutaneous BID  . ivabradine  2.5 mg Oral BID WC  . Linaclotide  290 mcg Oral Daily  . losartan  12.5 mg Oral QPM  . magnesium oxide  400 mg Oral Daily  .  neomycin-bacitracin-polymyxin   Topical BID  . pantoprazole  40 mg Oral Daily  . potassium chloride  40 mEq Oral TID  . sodium chloride  10-40 mL Intracatheter Q12H  . sodium chloride  3 mL Intravenous Q12H  . sodium chloride  3 mL Intravenous Q12H  . sodium chloride  3 mL Intravenous Q12H  . spironolactone  25 mg Oral BID  . temazepam  30  mg Oral QHS  . torsemide  80 mg Oral BID  . vitamin E  400 Units Oral Daily   Continuous Infusions: . milrinone 0.375 mcg/kg/min (04/03/15 2340)     Radonna Ricker Rosine Beat  [facility] Pager [phone] If 7PM-7AM, please contact night-coverage at [email or web address], password [facility] 04/04/2015, 7:13 AM  LOS: 8 days

## 2015-04-04 NOTE — Progress Notes (Signed)
Patient's 49 year old daughter currently staying with patient's niece Orlando Penner [phone]) & sister-in-law Oda Kilts [phone]).

## 2015-04-04 NOTE — Progress Notes (Signed)
Advanced Heart Failure Rounding Note  PCP: Lorenza Evangelist Primary Cardiologist: [name]  Subjective:    Admitted with   RHC 03/31/15, Doing better on milrinone. No SOB at rest. Has not been moving around enough to comment on DOE but PT to see today. She is still getting dizzy occasionally, but states it is coming less often and is less severe. .  Milrinone increased to 0.375 on 7/22. Yesterday had recurrent VT/VF and was shocked by ICD. Co-ox 71%  K 4.6   Feels fine this am.   RHC 03/31/15 RA = 28 RV = 83/19/30 PA = 86/38 (65) PCW = 36 Fick cardiac output/index = 2.9/1.3 Thermo cardiac output/index = 3.1/1.3 PVR = 10 Arterial sat = 99% PA sat = 45%, 48%   Objective:   Weight Range: 119.9 kg (264 lb 5.3 oz) Body mass index is 43.99 kg/(m^2).   Vital Signs:   Temp:  [97.2 F (36.2 C)-98.6 F (37 C)] 97.2 F (36.2 C) (07/24 0800) Pulse Rate:  [37-84] 40 (07/24 1000) Resp:  [12-18] 12 (07/23 1556) BP: (96-142)/(51-110) 114/51 mmHg (07/24 1000) SpO2:  [90 %-99 %] 90 % (07/24 1000) Weight:  [119.9 kg (264 lb 5.3 oz)] 119.9 kg (264 lb 5.3 oz) (07/24 0417) Last BM Date: 04/02/15  Weight change: Filed Weights   04/02/15 0500 04/03/15 0500 04/04/15 0417  Weight: 122.2 kg (269 lb 6.4 oz) 119.432 kg (263 lb 4.8 oz) 119.9 kg (264 lb 5.3 oz)    Intake/Output:   Intake/Output Summary (Last 24 hours) at 04/04/15 1006 Last data filed at 04/04/15 1000  Gross per 24 hour  Intake 1058.9 ml  Output   2000 ml  Net -941.1 ml     Physical Exam: General: Obese. No resp difficulty. NAD HEENT: Abrasion below left eye from fall Neck: supple. RIJ swan JVP difficult to assess 2/2 girth. Carotids 2+ bilat; no bruits. No lymphadenopathy or thryomegaly noted. Cor: PMI nondisplaced. Regular rate & rhythm with frequent ectopy. No rubs. 2/6 HSM LLSB, ?S3 Lungs: Diminished bilaterally.clear Abdomen: marked central obesity, nontender, mild distention. No hepatosplenomegaly. No bruits  or masses. Good bowel sounds. Extremities: no cyanosis, clubbing, rash. Trace  edema to mid calf bilaterally. Neuro: alert & orientedx3, cranial nerves grossly intact. moves all 4 extremities w/o difficulty. Affect flat   Telemetry: A sensed V paced in 80s. Frequent PVCs Episode of VT  Labs: CBC No results for input(s): WBC, NEUTROABS, HGB, HCT, MCV, PLT in the last 72 hours. Basic Metabolic Panel  Recent Labs  04/02/15 0510 04/03/15 0523 04/04/15 0431  NA 135 131* 131*  K 4.5 3.9 4.6  CL 84* 80* 82*  CO2 42* 40* 40*  GLUCOSE 135* 232* 166*  BUN 32* 34* 37*  CALCIUM 9.0 8.9 8.9  MG 2.0 2.1  --    Liver Function Tests No results for input(s): AST, ALT, ALKPHOS, BILITOT, PROT, ALBUMIN in the last 72 hours. No results for input(s): LIPASE, AMYLASE in the last 72 hours. Cardiac Enzymes No results for input(s): CKTOTAL, CKMB, CKMBINDEX, TROPONINI in the last 72 hours.  BNP: BNP (last 3 results)  Recent Labs  01/20/15 0040 01/26/15 1100 03/26/15 1633  BNP 166.0* 223.8* 549.5*    ProBNP (last 3 results)  Recent Labs  08/04/14 2339 08/21/14 1117  PROBNP 1727.0* 171.9*     D-Dimer No results for input(s): DDIMER in the last 72 hours. Hemoglobin A1C No results for input(s): HGBA1C in the last 72 hours. Fasting Lipid Panel No results for  input(s): CHOL, HDL, LDLCALC, TRIG, CHOLHDL, LDLDIRECT in the last 72 hours. Thyroid Function Tests No results for input(s): TSH, T4TOTAL, T3FREE, THYROIDAB in the last 72 hours.  Invalid input(s): FREET3  Other results:     Imaging/Studies:  Dg Chest Port 1 View  04/02/2015   CLINICAL DATA:  49 year old female status post central line placement. Check position.  EXAM: PORTABLE CHEST - 1 VIEW  COMPARISON:  Chest radiograph dated 03/26/2015 and CT dated 03/27/2015.  FINDINGS: Right-sided PICC is noted with tip over central SVC. There is moderate cardiomegaly. No consolidation, pleural effusion, or pneumothorax. Left  pectoral AICD device.  IMPRESSION: Right-sided PICC with tip over central SVC.   Electronically Signed   By: Elgie Collard M.D.   On: 04/02/2015 18:41    Latest Echo  Latest Cath   Medications:     Scheduled Medications: . allopurinol  300 mg Oral Daily  . amiodarone  400 mg Oral BID  . antiseptic oral rinse  7 mL Mouth Rinse BID  . aspirin EC  81 mg Oral Daily  . docusate sodium  200 mg Oral BID  . DULoxetine  30 mg Oral Daily  . ferrous sulfate  325 mg Oral TID  . gabapentin  600 mg Oral QID  . heparin  5,000 Units Subcutaneous 3 times per day  . insulin aspart  0-5 Units Subcutaneous QHS  . insulin aspart  0-9 Units Subcutaneous TID WC  . insulin detemir  53 Units Subcutaneous BID  . ivabradine  2.5 mg Oral BID WC  . Linaclotide  290 mcg Oral Daily  . losartan  12.5 mg Oral QPM  . magnesium oxide  400 mg Oral Daily  . neomycin-bacitracin-polymyxin   Topical BID  . pantoprazole  40 mg Oral Daily  . potassium chloride  40 mEq Oral TID  . spironolactone  25 mg Oral BID  . temazepam  30 mg Oral QHS  . torsemide  80 mg Oral BID  . vitamin E  400 Units Oral Daily    Infusions: . milrinone 0.375 mcg/kg/min (04/04/15 0800)    PRN Medications: acetaminophen, albuterol, cyclobenzaprine, ondansetron (ZOFRAN) IV, oxyCODONE-acetaminophen   Assessment/Plan   1. 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 - ARB, ivabradine, spiro, and dig - Continues to be overloaded - BB held in setting of cardiogenic shock 2. Atypical CP - Trop negative. Normal cors on cath 4/15 3. VT/VF with ICD resuscitation - by ICD interrogation  4. CKD Stage 3 5. GERD 6. DM2 7. OSA with CPAP 8. Morbid obesity 9. AKI 10. Frequent PVCs- K 4.5, Mg 2.0, Will follow. 11. Urinary retention - Bladder scan 03/30/15 showed 273 cc. Pt was then able to pass 300 cc with 0 cc residual.  She had recurrent VT/VF yesterday. She will need home milrinone and I am concerned that she will have continued  VT/VF. I spoke with Dr. [name] this am who recommended continuing load with IV amio. Can add Ranexa as needed. Volume status improved.   Length of Stay: 8 Analiyah Lechuga MD  04/04/2015, 10:06 AM Advanced Heart Failure Team Pager [phone] (M-F; 7a - 4p)  Please contact [facility] for night-coverage after hours (4p -7a ) and weekends on [email or web address]

## 2015-04-05 ENCOUNTER — Inpatient Hospital Stay (HOSPITAL_COMMUNITY): Payer: Medicare Other

## 2015-04-05 DIAGNOSIS — I472 Ventricular tachycardia, unspecified: Secondary | ICD-10-CM | POA: Insufficient documentation

## 2015-04-05 LAB — MAGNESIUM: Magnesium: 2.1 mg/dL (ref 1.7–2.4)

## 2015-04-05 LAB — CARBOXYHEMOGLOBIN
CARBOXYHEMOGLOBIN: 0.9 % (ref 0.5–1.5)
Carboxyhemoglobin: 1.5 % (ref 0.5–1.5)
METHEMOGLOBIN: 1.4 % (ref 0.0–1.5)
Methemoglobin: 0.8 % (ref 0.0–1.5)
O2 Saturation: 52.6 %
O2 Saturation: 65.8 %
Total hemoglobin: 16 g/dL (ref 12.0–16.0)
Total hemoglobin: 15.7 g/dL (ref 12.0–16.0)

## 2015-04-05 LAB — GLUCOSE, CAPILLARY
GLUCOSE-CAPILLARY: 146 mg/dL — AB (ref 65–99)
GLUCOSE-CAPILLARY: 186 mg/dL — AB (ref 65–99)
Glucose-Capillary: 170 mg/dL — ABNORMAL HIGH (ref 65–99)
Glucose-Capillary: 221 mg/dL — ABNORMAL HIGH (ref 65–99)

## 2015-04-05 LAB — BASIC METABOLIC PANEL
Anion gap: 10 (ref 5–15)
Anion gap: 13 (ref 5–15)
BUN: 40 mg/dL — ABNORMAL HIGH (ref 6–20)
BUN: 42 mg/dL — AB (ref 6–20)
CALCIUM: 8.9 mg/dL (ref 8.9–10.3)
CALCIUM: 9 mg/dL (ref 8.9–10.3)
CO2: 38 mmol/L — ABNORMAL HIGH (ref 22–32)
CO2: 35 mmol/L — ABNORMAL HIGH (ref 22–32)
CREATININE: 1.42 mg/dL — AB (ref 0.44–1.00)
Chloride: 81 mmol/L — ABNORMAL LOW (ref 101–111)
Chloride: 80 mmol/L — ABNORMAL LOW (ref 101–111)
Creatinine, Ser: 1.47 mg/dL — ABNORMAL HIGH (ref 0.44–1.00)
GFR calc Af Amer: 49 mL/min — ABNORMAL LOW (ref >60)
GFR calc Af Amer: 47 mL/min — ABNORMAL LOW (ref >60)
GFR, EST NON AFRICAN AMERICAN: 43 mL/min — AB (ref >60)
GFR, EST NON AFRICAN AMERICAN: 41 mL/min — AB (ref >60)
GLUCOSE: 249 mg/dL — AB (ref 65–99)
GLUCOSE: 274 mg/dL — AB (ref 65–99)
POTASSIUM: 4.4 mmol/L (ref 3.5–5.1)
Potassium: 4 mmol/L (ref 3.5–5.1)
SODIUM: 129 mmol/L — AB (ref 135–145)
Sodium: 128 mmol/L — ABNORMAL LOW (ref 135–145)

## 2015-04-05 LAB — CBC
HCT: 45.9 % (ref 36.0–46.0)
Hemoglobin: 15 g/dL (ref 12.0–15.0)
MCH: 30.7 pg (ref 26.0–34.0)
MCHC: 32.7 g/dL (ref 30.0–36.0)
MCV: 94.1 fL (ref 78.0–100.0)
PLATELETS: 279 10*3/uL (ref 150–400)
RBC: 4.88 MIL/uL (ref 3.87–5.11)
RDW: 15.2 % (ref 11.5–15.5)
WBC: 7.1 10*3/uL (ref 4.0–10.5)

## 2015-04-05 MED ORDER — FUROSEMIDE 10 MG/ML IJ SOLN
80.0000 mg | Freq: Two times a day (BID) | INTRAMUSCULAR | Status: DC
  Administered 2015-04-05 – 2015-04-06 (×4): 80 mg via INTRAVENOUS
  Filled 2015-04-05 (×6): qty 8

## 2015-04-05 MED ORDER — RANOLAZINE ER 500 MG PO TB12
500.0000 mg | ORAL_TABLET | Freq: Two times a day (BID) | ORAL | Status: DC
  Administered 2015-04-05 – 2015-04-08 (×7): 500 mg via ORAL
  Filled 2015-04-05 (×8): qty 1

## 2015-04-05 MED ORDER — DIGOXIN 0.0625 MG HALF TABLET
0.0625 mg | ORAL_TABLET | Freq: Every day | ORAL | Status: DC
  Administered 2015-04-06 – 2015-04-08 (×3): 0.0625 mg via ORAL
  Filled 2015-04-05 (×3): qty 1

## 2015-04-05 MED ORDER — INSULIN DETEMIR 100 UNIT/ML ~~LOC~~ SOLN
56.0000 [IU] | Freq: Two times a day (BID) | SUBCUTANEOUS | Status: DC
  Administered 2015-04-05 – 2015-04-06 (×4): 56 [IU] via SUBCUTANEOUS
  Filled 2015-04-05 (×5): qty 0.56

## 2015-04-05 MED ORDER — METOLAZONE 2.5 MG PO TABS
2.5000 mg | ORAL_TABLET | Freq: Once | ORAL | Status: AC
  Administered 2015-04-05: 2.5 mg via ORAL
  Filled 2015-04-05: qty 1

## 2015-04-05 NOTE — Progress Notes (Signed)
CARDIAC REHAB PHASE I   PRE:  Rate/Rhythm: 82 paced c/ PVCs  BP:  Sitting: 122/53        SaO2: 90 RA  MODE:  Ambulation: 350 ft   POST:  Rate/Rhythm: 90 paced c/ PVCs  BP:  Sitting: 135/73         SaO2: 95 RA  Pt up in chair, states she "got some bad news" and is trying to process things. Pt is states she is concerned about her daughter and "getting her affairs in order in case anything should happen to her." Pt verbalized interest in palliative care consult although she states she wants "all heroic measures taken" to save her but would like to discuss her situation/prognosis with someone.  Pt states she is followed by a case manager named "Helmut Muster" as an outpatient and was working on a living will and possibly a Product manager. Pt ambulated 350 ft on RA, rolling walker, IV, steady gait, assist x 2 (followed with recliner), tolerated well. Pt denies CP, dizziness, DOE, declined rest stop. Pt did c/o some fatigue upon return to room and back pain. Pt requesting flexeril, RN notified. Pt to recliner after walk, feet elevated, call bell within reach. Will follow-up tomorrow.   Joylene Grapes, RN, BSN 04/05/2015 2:24 PM

## 2015-04-05 NOTE — Progress Notes (Signed)
[facility] PROGRESS NOTE Interim History: 49 year old female with morbid obesity, obstructive sleep apnea on CPAP, nonischemic cardiomyopathy, chronic systolic and diastolic CHF, EF 94-50% with grade 3 diastolic dysfunction per echo in March 2016, GERD, diabetes mellitus on insulin presented to ED with syncopal episode and progressive worsening shortness of breath. Syncopal episode due to V-tach catch by AICD. With ADHF on IV milrinoe and diuretics. Has diurese well, now on oral torsemide. Had few episode of VT/VF on 7.23.2016. Loaded with amio.  Filed Weights   04/03/15 0500 04/04/15 0417 04/05/15 0300  Weight: 119.432 kg (263 lb 4.8 oz) 119.9 kg (264 lb 5.3 oz) 125.3 kg (276 lb 3.8 oz)        Intake/Output Summary (Last 24 hours) at 04/05/15 3888 Last data filed at 04/05/15 0500  Gross per 24 hour  Intake 1554.8 ml  Output   1875 ml  Net -320.2 ml     Assessment/Plan: Syncope/ VT/VF in the setting of hypokalemia on admisison: She was resuscitated with an ICD shock, recurrent VF/VT on 7.23.2016 and shock. Appreciate cards assistance. She was loaded with amio on 7.24.2016. With no events overnight.  Acute on chronic combined systolic and diastolic heart failure/Cardiogenic shock: On oral torsemide. Weight 119 kg. Will consult PMT. RHC performed on 7.21.2016,now on milrinone. PICC line place on 7.22.2016 Cont lytes replacement. Mild rise in Cr. CVP pending. Advance HF team recommended Sindenafil. Will need home milrinone. Ambulate and check sats.  Insulin-dependent diabetes mellitus: With several episodes of hypoglycemia on admission, ? Due to worsening renal function. Her A1c was 7.5. BG raising slowly, cont titrate long acting.  GERD: Continue Protonix.  Essential hypertension: Continue Coreg, losartan, metolazone and Lasix.  Her blood pressure is close to goal.  Atypical chest pain: CT imaging of the chest negative for PE, ? Due to ADHF.  Morbid  obesity: Counseling.  Code Status: full Family Communication: none  Disposition Plan: home in 3-5 days   Consultants:  cardiology  Procedures: ECHO: none   Antibiotics:  None  HPI/Subjective: No complains, feels tires specially with walking.  Objective: Filed Vitals:   04/04/15 1600 04/04/15 1900 04/04/15 2300 04/05/15 0300  BP: 116/60 112/66 136/70 133/96  Pulse:      Temp:  98.2 F (36.8 C) 97.6 F (36.4 C) 97.5 F (36.4 C)  TempSrc:  Oral Oral Oral  Resp:  16 18 15   Height:      Weight:    125.3 kg (276 lb 3.8 oz)  SpO2: 96% 97% 91% 96%     Exam:  General: Alert, awake, oriented x3, in no acute distress.  HEENT: No bruits, no goiter.  Heart: Regular rate and rhythm.  Lungs: Good air movement, clear Abdomen: Soft, nontender, nondistended, positive bowel sounds.  Neuro: Grossly intact, nonfocal.   Data Reviewed: Basic Metabolic Panel:  Recent Labs Lab 03/30/15 0316  03/31/15 1114  04/01/15 1650 04/02/15 0510 04/03/15 0523 04/04/15 0431 04/05/15 0515  NA 132*  < >  --   < > 132* 135 131* 131* 129*  K 4.0  < >  --   < > 4.0 4.5 3.9 4.6 4.4  CL 88*  < >  --   < > 81* 84* 80* 82* 81*  CO2 36*  < >  --   < > 41* 42* 40* 40* 38*  GLUCOSE 150*  < >  --   < > 171* 135* 232* 166* 249*  BUN 24*  < >  --   < >  31* 32* 34* 37* 40*  CREATININE 1.18*  < >  --   < > 1.37* 1.51* 1.23* 1.33* 1.42*  CALCIUM 8.5*  < >  --   < > 8.7* 9.0 8.9 8.9 8.9  MG 2.0  --  2.2  --   --  2.0 2.1  --  2.1  < > = values in this interval not displayed. Liver Function Tests: No results for input(s): AST, ALT, ALKPHOS, BILITOT, PROT, ALBUMIN in the last 168 hours. No results for input(s): LIPASE, AMYLASE in the last 168 hours. No results for input(s): AMMONIA in the last 168 hours. CBC:  Recent Labs Lab 03/31/15 1207 04/05/15 0515  WBC 5.2 7.1  HGB 15.0 15.0  HCT 46.0 45.9  MCV 93.7 94.1  PLT 239 279   Cardiac Enzymes: No results for input(s): CKTOTAL, CKMB,  CKMBINDEX, TROPONINI in the last 168 hours. BNP (last 3 results)  Recent Labs  01/20/15 0040 01/26/15 1100 03/26/15 1633  BNP 166.0* 223.8* 549.5*    ProBNP (last 3 results)  Recent Labs  08/04/14 2339 08/21/14 1117  PROBNP 1727.0* 171.9*    CBG:  Recent Labs Lab 04/03/15 2127 04/04/15 0814 04/04/15 1127 04/04/15 1638 04/04/15 2147  GLUCAP 153* 176* 191* 162* 195*    Recent Results (from the past 240 hour(s))  MRSA PCR Screening     Status: None   Collection Time: 04/02/15 11:45 AM  Result Value Ref Range Status   MRSA by PCR NEGATIVE NEGATIVE Final    Comment:        The GeneXpert MRSA Assay (FDA approved for NASAL specimens only), is one component of a comprehensive MRSA colonization surveillance program. It is not intended to diagnose MRSA infection nor to guide or monitor treatment for MRSA infections.      Studies: No results found.  Scheduled Meds: . allopurinol  300 mg Oral Daily  . antiseptic oral rinse  7 mL Mouth Rinse BID  . aspirin EC  81 mg Oral Daily  . docusate sodium  200 mg Oral BID  . DULoxetine  30 mg Oral Daily  . ferrous sulfate  325 mg Oral TID  . gabapentin  600 mg Oral QID  . heparin  5,000 Units Subcutaneous 3 times per day  . insulin aspart  0-5 Units Subcutaneous QHS  . insulin aspart  0-9 Units Subcutaneous TID WC  . insulin detemir  53 Units Subcutaneous BID  . ivabradine  2.5 mg Oral BID WC  . Linaclotide  290 mcg Oral Daily  . losartan  12.5 mg Oral QPM  . magnesium oxide  400 mg Oral Daily  . neomycin-bacitracin-polymyxin   Topical BID  . pantoprazole  40 mg Oral Daily  . potassium chloride  40 mEq Oral TID  . spironolactone  25 mg Oral BID  . temazepam  30 mg Oral QHS  . torsemide  80 mg Oral BID  . vitamin E  400 Units Oral Daily   Continuous Infusions: . amiodarone 30 mg/hr (04/04/15 2345)  . milrinone 0.375 mcg/kg/min (04/05/15 0435)     Marinda Elk  [facility] Pager [phone] If  7PM-7AM, please contact night-coverage at [email or web address], password [facility] 04/05/2015, 7:24 AM  LOS: 9 days

## 2015-04-05 NOTE — Progress Notes (Signed)
Utilization Review Completed.[name] T7/25/2016  

## 2015-04-05 NOTE — Care Management Important Message (Signed)
Important Message  Patient Details  Name: [name] MRN: [id number] Date of Birth: 1965-10-28   Medicare Important Message Given:  Yes-fourth notification given    [name] 04/05/2015, 2:16 PM

## 2015-04-05 NOTE — Progress Notes (Addendum)
Patient ID: [name], female   DOB: [birth year], 49 y.o.   MRN: [id number]  Advanced Heart Failure Rounding Note  PCP: Lorenza Evangelist Primary Cardiologist: [name]  Subjective:    Admitted with acute on chronic systolic CHF.   RHC 03/31/15 with low output, milrinone started.  Not moving much, denies dyspnea at rest.   Milrinone increased to 0.375 on 7/22. 7/23 had recurrent VT/VF and was shocked by ICD. Now with PVCs but no recurrent VT.  Co-ox lower today at 53%, CVP 16.    RHC 03/31/15 RA = 28 RV = 83/19/30 PA = 86/38 (65) PCW = 36 Fick cardiac output/index = 2.9/1.3 Thermo cardiac output/index = 3.1/1.3 PVR = 10 Arterial sat = 99% PA sat = 45%, 48%   Objective:   Weight Range: 276 lb 3.8 oz (125.3 kg) Body mass index is 45.97 kg/(m^2).   Vital Signs:   Temp:  [97.2 F (36.2 C)-98.5 F (36.9 C)] 97.5 F (36.4 C) (07/25 0300) Pulse Rate:  [39-43] 39 (07/24 1516) Resp:  [15-18] 15 (07/25 0300) BP: (96-136)/(51-96) 133/96 mmHg (07/25 0300) SpO2:  [90 %-99 %] 96 % (07/25 0300) Weight:  [276 lb 3.8 oz (125.3 kg)] 276 lb 3.8 oz (125.3 kg) (07/25 0300) Last BM Date: 04/02/15  Weight change: Filed Weights   04/03/15 0500 04/04/15 0417 04/05/15 0300  Weight: 263 lb 4.8 oz (119.432 kg) 264 lb 5.3 oz (119.9 kg) 276 lb 3.8 oz (125.3 kg)    Intake/Output:   Intake/Output Summary (Last 24 hours) at 04/05/15 0746 Last data filed at 04/05/15 0500  Gross per 24 hour  Intake 1554.8 ml  Output   1875 ml  Net -320.2 ml     Physical Exam: General: Obese. No resp difficulty. NAD HEENT: Abrasion below left eye from fall Neck: supple. RIJ swan JVP difficult to assess 2/2 girth but appears elevated. Carotids 2+ bilat; no bruits. No lymphadenopathy or thryomegaly noted. Cor: PMI nondisplaced. Regular rate & rhythm with frequent ectopy. No rubs. 2/6 HSM LLSB, no S3 Lungs: Diminished bilaterally.clear Abdomen: marked central obesity, nontender, mild distention. No  hepatosplenomegaly. No bruits or masses. Good bowel sounds. Extremities: no cyanosis, clubbing, rash. Trace  edema to mid calf bilaterally. Neuro: alert & orientedx3, cranial nerves grossly intact. moves all 4 extremities w/o difficulty. Affect flat  Telemetry: A sensed V paced in 80s. Frequent PVCs   Labs: CBC  Recent Labs  04/05/15 0515  WBC 7.1  HGB 15.0  HCT 45.9  MCV 94.1  PLT 279   Basic Metabolic Panel  Recent Labs  04/03/15 0523 04/04/15 0431 04/05/15 0515  NA 131* 131* 129*  K 3.9 4.6 4.4  CL 80* 82* 81*  CO2 40* 40* 38*  GLUCOSE 232* 166* 249*  BUN 34* 37* 40*  CALCIUM 8.9 8.9 8.9  MG 2.1  --  2.1   Liver Function Tests No results for input(s): AST, ALT, ALKPHOS, BILITOT, PROT, ALBUMIN in the last 72 hours. No results for input(s): LIPASE, AMYLASE in the last 72 hours. Cardiac Enzymes No results for input(s): CKTOTAL, CKMB, CKMBINDEX, TROPONINI in the last 72 hours.  BNP: BNP (last 3 results)  Recent Labs  01/20/15 0040 01/26/15 1100 03/26/15 1633  BNP 166.0* 223.8* 549.5*    ProBNP (last 3 results)  Recent Labs  08/04/14 2339 08/21/14 1117  PROBNP 1727.0* 171.9*     D-Dimer No results for input(s): DDIMER in the last 72 hours. Hemoglobin A1C No results for input(s): HGBA1C in the  last 72 hours. Fasting Lipid Panel No results for input(s): CHOL, HDL, LDLCALC, TRIG, CHOLHDL, LDLDIRECT in the last 72 hours. Thyroid Function Tests No results for input(s): TSH, T4TOTAL, T3FREE, THYROIDAB in the last 72 hours.  Invalid input(s): FREET3  Other results:     Imaging/Studies:  No results found.  Latest Echo  Latest Cath   Medications:     Scheduled Medications: . allopurinol  300 mg Oral Daily  . antiseptic oral rinse  7 mL Mouth Rinse BID  . aspirin EC  81 mg Oral Daily  . docusate sodium  200 mg Oral BID  . DULoxetine  30 mg Oral Daily  . ferrous sulfate  325 mg Oral TID  . furosemide  80 mg Intravenous BID  .  gabapentin  600 mg Oral QID  . heparin  5,000 Units Subcutaneous 3 times per day  . insulin aspart  0-5 Units Subcutaneous QHS  . insulin aspart  0-9 Units Subcutaneous TID WC  . insulin detemir  56 Units Subcutaneous BID  . ivabradine  2.5 mg Oral BID WC  . Linaclotide  290 mcg Oral Daily  . losartan  12.5 mg Oral QPM  . magnesium oxide  400 mg Oral Daily  . metolazone  2.5 mg Oral Once  . neomycin-bacitracin-polymyxin   Topical BID  . pantoprazole  40 mg Oral Daily  . potassium chloride  40 mEq Oral TID  . ranolazine  500 mg Oral BID  . spironolactone  25 mg Oral BID  . temazepam  30 mg Oral QHS  . vitamin E  400 Units Oral Daily    Infusions: . amiodarone 30 mg/hr (04/04/15 2345)  . milrinone 0.375 mcg/kg/min (04/05/15 0435)    PRN Medications: acetaminophen, albuterol, cyclobenzaprine, ondansetron (ZOFRAN) IV, oxyCODONE-acetaminophen   Assessment/Plan   1. Acute on chronic systolic CHF: EF 16-10% Grade 3 DD.  Nonischemic cardiomyopathy.  Remains volume overloaded, CVP 16.  Co-ox lower today at 53%.  - Do not want to increase milrinone with ectopy/recent VT, will resend co-ox. - Will use Lasix 80 mg IV bid + metolazone 2.5 mg x 1 today.  Replete K aggressively, repeat BMET this afternoon.  - ARB, ivabradine, spiro, and dig => send digoxin level in am. - BB held in setting of cardiogenic shock - Poor candidate for transplant given obesity, ?LVAD in future but would like her to lose some weight.  2. Atypical CP: Trop negative. Normal cors on cath 4/15 3. VT/VF with ICD resuscitation:  Frequent ectopy now.  Continue amiodarone, will add ranolazine 500 mg bid with ECG in am.  4. CKD Stage 3: Follow creatinine closely.  5. GERD 6. DM2 7. OSA with CPAP 8. Morbid obesity 9. Hyponatremia: Fluid restrict.   35 minutes critical care time.   Length of Stay: 9 [name] MD  04/05/2015, 7:46 AM Advanced Heart Failure Team Pager [phone] (M-F; 7a - 4p)  Please contact [facility] for night-coverage after hours (4p -7a ) and weekends on [email or web address]

## 2015-04-05 NOTE — Consult Note (Signed)
Bedside consultation with [name].  She is sitting up in the recliner chair, appears on no acute physical distress, on room air, reports she was able tolerate at rest without supplemental O2 today.    Our discussion began with [name] able to articulate the seriousness of her medical condition and complications.  She is tearful in understanding that her treatment options are becoming more limited as her disease process worsens.  Her most pressing needs are currently social and emotional.  We discussed her 53 year old daughter and the decisions that need to be made with respect to her well being in the future.  [name] and [name] live about 3 hours away in [facility] and will be in [place] on Aug.5th.  I assured [name] that the PMT would be available for a family meeting at any time and if need be we could conference over the phone, although not ideal it would facilitate an immediate need for information clarification.  [name] lives with her [name] along with her daughter.  Her plans are for her daughter to be legally raised by an Aunt, (sister of her daughters biological [name]) See reports that she has discussed the guardianship request with the aunt although no official paper work has been completed.  I discussed discussed PMT role in not only emotinal support but also physical symptom management support.  [name] denies and acute pain symptoms currently, her respiratory status appears to be under control today.  I asked her about feelings with anxiety and depression, she is taking cymbalta and feels that those symptoms have been better lately as well.  Her current coping strategy is to pray when she is feeling overwhelmed, she said sometimes she just needs to have a good cry about her situation and the losses she will endure.  She does not want sympathy rather empathy from those who engage in conversation.  I made a commitment to visit and continue to engage in the conversation related to goals of care and her whishes as  she becomes sicker.  I'm going to assist with paper work and also atempt to have these completed before she d/c home this time.  She requested some spiritual books that help her have pease when she if feeling anxious.  I will provide those for her.  Plan to come with information for ACP, will try to completeand offer continued support.  Cindra Presume, MSN, RN-BC, [facility]

## 2015-04-06 DIAGNOSIS — I5023 Acute on chronic systolic (congestive) heart failure: Secondary | ICD-10-CM

## 2015-04-06 DIAGNOSIS — I509 Heart failure, unspecified: Secondary | ICD-10-CM

## 2015-04-06 LAB — GLUCOSE, CAPILLARY
GLUCOSE-CAPILLARY: 118 mg/dL — AB (ref 65–99)
Glucose-Capillary: 172 mg/dL — ABNORMAL HIGH (ref 65–99)
Glucose-Capillary: 173 mg/dL — ABNORMAL HIGH (ref 65–99)
Glucose-Capillary: 155 mg/dL — ABNORMAL HIGH (ref 65–99)

## 2015-04-06 LAB — BLOOD GAS, ARTERIAL
Acid-Base Excess: 10.6 mmol/L — ABNORMAL HIGH (ref 0.0–2.0)
Bicarbonate: 35.4 mEq/L — ABNORMAL HIGH (ref 20.0–24.0)
Drawn by: 280981
FIO2: 0.21
O2 Saturation: 91.7 %
Patient temperature: 98.6
TCO2: 37 mmol/L (ref 0–100)
pCO2 arterial: 54.3 mmHg — ABNORMAL HIGH (ref 35.0–45.0)
pH, Arterial: 7.429 (ref 7.350–7.450)
pO2, Arterial: 66.5 mmHg — ABNORMAL LOW (ref 80.0–100.0)

## 2015-04-06 LAB — CARBOXYHEMOGLOBIN
Carboxyhemoglobin: 1 % (ref 0.5–1.5)
Methemoglobin: 0.9 % (ref 0.0–1.5)
O2 Saturation: 56.9 %
Total hemoglobin: 15.3 g/dL (ref 12.0–16.0)

## 2015-04-06 LAB — BASIC METABOLIC PANEL
Anion gap: 11 (ref 5–15)
BUN: 43 mg/dL — AB (ref 6–20)
CO2: 38 mmol/L — AB (ref 22–32)
Calcium: 9 mg/dL (ref 8.9–10.3)
Chloride: 80 mmol/L — ABNORMAL LOW (ref 101–111)
Creatinine, Ser: 1.66 mg/dL — ABNORMAL HIGH (ref 0.44–1.00)
GFR calc Af Amer: 41 mL/min — ABNORMAL LOW (ref >60)
GFR calc non Af Amer: 35 mL/min — ABNORMAL LOW (ref >60)
Glucose, Bld: 186 mg/dL — ABNORMAL HIGH (ref 65–99)
POTASSIUM: 4.2 mmol/L (ref 3.5–5.1)
Sodium: 129 mmol/L — ABNORMAL LOW (ref 135–145)

## 2015-04-06 LAB — CBC
HEMATOCRIT: 45.3 % (ref 36.0–46.0)
HEMOGLOBIN: 15 g/dL (ref 12.0–15.0)
MCH: 31 pg (ref 26.0–34.0)
MCHC: 33.1 g/dL (ref 30.0–36.0)
MCV: 93.6 fL (ref 78.0–100.0)
PLATELETS: 280 10*3/uL (ref 150–400)
RBC: 4.84 MIL/uL (ref 3.87–5.11)
RDW: 15.2 % (ref 11.5–15.5)
WBC: 6.3 10*3/uL (ref 4.0–10.5)

## 2015-04-06 LAB — PLATELET INHIBITION P2Y12: Platelet Function  P2Y12: 121 [PRU] — ABNORMAL LOW (ref 194–418)

## 2015-04-06 LAB — ANTITHROMBIN III: AntiThromb III Func: 80 % (ref 75–120)

## 2015-04-06 LAB — URIC ACID: Uric Acid, Serum: 6 mg/dL (ref 2.3–6.6)

## 2015-04-06 LAB — LACTATE DEHYDROGENASE: LDH: 246 U/L — ABNORMAL HIGH (ref 98–192)

## 2015-04-06 MED ORDER — ISOSORB DINITRATE-HYDRALAZINE 20-37.5 MG PO TABS
0.5000 | ORAL_TABLET | Freq: Three times a day (TID) | ORAL | Status: DC
  Administered 2015-04-06 (×3): 0.5 via ORAL
  Filled 2015-04-06 (×6): qty 0.5

## 2015-04-06 MED ORDER — AMIODARONE HCL 200 MG PO TABS
200.0000 mg | ORAL_TABLET | Freq: Two times a day (BID) | ORAL | Status: DC
  Administered 2015-04-06 – 2015-04-08 (×5): 200 mg via ORAL
  Filled 2015-04-06 (×6): qty 1

## 2015-04-06 MED ORDER — METOLAZONE 5 MG PO TABS
5.0000 mg | ORAL_TABLET | Freq: Once | ORAL | Status: AC
  Administered 2015-04-06: 5 mg via ORAL
  Filled 2015-04-06: qty 1

## 2015-04-06 NOTE — Progress Notes (Signed)
Patient ID: [name], female   DOB: 1966/04/30, 49 y.o.   MRN: [id number]  Advanced Heart Failure Rounding Note  PCP: Lorenza Evangelist Primary Cardiologist: [name]  Subjective:    Admitted with acute on chronic systolic CHF.   RHC 03/31/15 with low output, milrinone started.  Not moving much, denies dyspnea at rest.   Milrinone increased to 0.375 on 7/22. 7/23 had recurrent VT/VF and was shocked by ICD. Now with PVCs but no recurrent VT.  Co-ox 66%=>57%.  CVP 14 today.  Walked with cardiac rehab yesterday, noted dyspnea but able to walk farther.   RHC 03/31/15 RA = 28 RV = 83/19/30 PA = 86/38 (65) PCW = 36 Fick cardiac output/index = 2.9/1.3 Thermo cardiac output/index = 3.1/1.3 PVR = 10 Arterial sat = 99% PA sat = 45%, 48%   Objective:   Weight Range: 280 lb 10.3 oz (127.3 kg) Body mass index is 46.7 kg/(m^2).   Vital Signs:   Temp:  [97.9 F (36.6 C)-98.6 F (37 C)] 98 F (36.7 C) (07/26 0324) Resp:  [16-18] 18 (07/26 0324) BP: (98-141)/(45-105) 126/77 mmHg (07/26 0324) SpO2:  [92 %-99 %] 97 % (07/26 0324) Weight:  [280 lb 10.3 oz (127.3 kg)] 280 lb 10.3 oz (127.3 kg) (07/26 0324) Last BM Date: 04/02/15  Weight change: Filed Weights   04/04/15 0417 04/05/15 0300 04/06/15 0324  Weight: 264 lb 5.3 oz (119.9 kg) 276 lb 3.8 oz (125.3 kg) 280 lb 10.3 oz (127.3 kg)    Intake/Output:   Intake/Output Summary (Last 24 hours) at 04/06/15 0759 Last data filed at 04/06/15 0700  Gross per 24 hour  Intake   2064 ml  Output   2800 ml  Net   -736 ml     Physical Exam: General: Obese. No resp difficulty. NAD Neck: supple. JVP difficult to assess 2/2 girth but suspect 9-10 cm. Carotids 2+ bilat; no bruits. No lymphadenopathy or thryomegaly noted. Cor: PMI nondisplaced. Regular rate & rhythm. No rubs. 2/6 HSM LLSB, no S3 Lungs: Diminished bilaterally.clear Abdomen: marked central obesity, nontender, mild distention. No hepatosplenomegaly. No bruits or masses. Good  bowel sounds. Extremities: no cyanosis, clubbing, rash. Trace ankle edema.  Neuro: alert & orientedx3, cranial nerves grossly intact. moves all 4 extremities w/o difficulty. Affect flat  Telemetry: A sensed V paced in 80s. Less frequent PVCs.    Labs: CBC  Recent Labs  04/05/15 0515 04/06/15 0515  WBC 7.1 6.3  HGB 15.0 15.0  HCT 45.9 45.3  MCV 94.1 93.6  PLT 279 280   Basic Metabolic Panel  Recent Labs  04/05/15 0515 04/05/15 1515 04/06/15 0515  NA 129* 128* 129*  K 4.4 4.0 4.2  CL 81* 80* 80*  CO2 38* 35* 38*  GLUCOSE 249* 274* 186*  BUN 40* 42* 43*  CALCIUM 8.9 9.0 9.0  MG 2.1  --   --    Liver Function Tests No results for input(s): AST, ALT, ALKPHOS, BILITOT, PROT, ALBUMIN in the last 72 hours. No results for input(s): LIPASE, AMYLASE in the last 72 hours. Cardiac Enzymes No results for input(s): CKTOTAL, CKMB, CKMBINDEX, TROPONINI in the last 72 hours.  BNP: BNP (last 3 results)  Recent Labs  01/20/15 0040 01/26/15 1100 03/26/15 1633  BNP 166.0* 223.8* 549.5*    ProBNP (last 3 results)  Recent Labs  08/04/14 2339 08/21/14 1117  PROBNP 1727.0* 171.9*     D-Dimer No results for input(s): DDIMER in the last 72 hours. Hemoglobin A1C No results  for input(s): HGBA1C in the last 72 hours. Fasting Lipid Panel No results for input(s): CHOL, HDL, LDLCALC, TRIG, CHOLHDL, LDLDIRECT in the last 72 hours. Thyroid Function Tests No results for input(s): TSH, T4TOTAL, T3FREE, THYROIDAB in the last 72 hours.  Invalid input(s): FREET3  Other results:     Imaging/Studies:  No results found.  Latest Echo  Latest Cath   Medications:     Scheduled Medications: . allopurinol  300 mg Oral Daily  . antiseptic oral rinse  7 mL Mouth Rinse BID  . aspirin EC  81 mg Oral Daily  . digoxin  0.0625 mg Oral Daily  . docusate sodium  200 mg Oral BID  . DULoxetine  30 mg Oral Daily  . ferrous sulfate  325 mg Oral TID  . furosemide  80 mg Intravenous  BID  . gabapentin  600 mg Oral QID  . heparin  5,000 Units Subcutaneous 3 times per day  . insulin aspart  0-5 Units Subcutaneous QHS  . insulin aspart  0-9 Units Subcutaneous TID WC  . insulin detemir  56 Units Subcutaneous BID  . isosorbide-hydrALAZINE  0.5 tablet Oral TID  . ivabradine  2.5 mg Oral BID WC  . Linaclotide  290 mcg Oral Daily  . magnesium oxide  400 mg Oral Daily  . metolazone  5 mg Oral Once  . neomycin-bacitracin-polymyxin   Topical BID  . pantoprazole  40 mg Oral Daily  . potassium chloride  40 mEq Oral TID  . ranolazine  500 mg Oral BID  . spironolactone  25 mg Oral BID  . temazepam  30 mg Oral QHS  . vitamin E  400 Units Oral Daily    Infusions: . amiodarone 30 mg/hr (04/06/15 0023)  . milrinone 0.375 mcg/kg/min (04/06/15 0205)    PRN Medications: acetaminophen, albuterol, cyclobenzaprine, ondansetron (ZOFRAN) IV, oxyCODONE-acetaminophen   Assessment/Plan   1. Acute on chronic systolic CHF: Nonischemic cardiomyopathy.  EF 30-35% in the past, difficult to assess on echo this admission due to poor windows. Nonischemic cardiomyopathy.  Remains volume overloaded, CVP trending down (14 this morning).  Co-ox lower today at 57% but was 66% yesterday afternoon.   - Do not want to increase milrinone with ectopy/recent VT, continue current dose 0.375.  It appears that she will need to go home on milrinone.   - Will use Lasix 80 mg IV bid + metolazone 5 mg x 1 today.  Would like to see one more day of good diuresis. - Stop losartan with rise in creatinine, will use Bidil 1/2 tab tid.  - Continue spironolactone, low dose digoxin.  - Beta blocker held in setting of cardiogenic shock - Poor candidate for transplant given obesity.  Will evaluate for LVAD => long-term milrinone is poor option due to ventricular ectopy/history of VT and ICD shocks. Dr Donata Clay to see.  Will need repeat limited echo for EF using contrast (echo this admission was poor quality, historically EF  has been somewhat out of LVAD range at 30-35%).   2. Atypical CP: Trop negative. Normal coronaries on cath 4/15 3. VT/VF with ICD resuscitation:  Now on amiodarone + ranolazine, ectopy appears to have calmed. Change over to po amiodarone.  4. CKD Stage 3: Follow creatinine closely, rose to 1.6 with diuresis.  5. GERD 6. DM2 7. OSA with CPAP 8. Morbid obesity 9. Hyponatremia: Fluid restrict.   35 minutes critical care time.   Length of Stay: 10 [name] MD  04/06/2015, 7:59 AM Advanced  Heart Failure Team Pager [phone] (M-F; 7a - 4p)  Please contact [facility] for night-coverage after hours (4p -7a ) and weekends on [email or web address]

## 2015-04-06 NOTE — Progress Notes (Signed)
[facility]  Patient Status:  New pt for [facility] this admission  [facility] is providing the following services:   HHRN and Home infusion pharmacy teams for home Milrinone as ordered. [facility] hospital infusion coordinator has provided and will continue with in hospital teaching regarding home inotrope POC. [facility] hospital team will follow pt and support DC home when deemed ready.   If patient discharges after hours, please call [phone].   [name] 04/06/2015, 12:59 PM

## 2015-04-06 NOTE — Consult Note (Signed)
[address]       [place] [address]             [phone]        [name] [facility] Medical Record #[id number] Date of Birth: 07-Mar-1966  Referring: No ref. provider found Primary Care: [name], MD  Chief Complaint:    Chief Complaint  Patient presents with  . Loss of Consciousness   patient examined, most recent cardiac echocardiogram independently reviewed   History of Present Illness:     49 year old morbidly obese AA female diabetic nonsmoker with idiopathic cardiomyopathy and advanced heart failure. She was admitted to the hospital for the fourth time in the last 18 months for advanced heart failure. She had a AICD placed 8 years ago which discharged several times in mid and late 2015. This year she is admitted for advanced symptoms of heart failure with dyspnea on exertion, swelling, orthopnea, and decreased exercise tolerance. Her last right heart catheterization demonstrated PA pressures of 65/35 with CVP 18 and cardiac index 1.9. She currently has a PA catheter in place and has been started on milrinone and her CVP now is 14 with cardiac index 2.1. Right she is scheduled to undergo left heart cath. Most recent cardiac echo shows biventricular dysfunction with severe LV dilatation end-diastolic diameter 9 cm,  Moderate mitral regurgitation, trivial tricuspid regurgitation, moderate-severe RV dysfunction. The patient is clinically improved on milrinone. Her creatinine is 1.6. She is in sinus rhythm. Her exercise physiology test done last year showed max oxygen consumption 10.2   The patient has significant pulmonary issues from restrictive issues related to her obesity. She has sleep apnea on CPAP at home. Her last resting PCO2 was 66. She is a nonsmoker.  The patient has significant history of GI bleeding. She has not been on anticoagulation for several years. 5 years ago she had upper GI bleeding from small bowel AVMs and was treated at  [facility]. No significant subsequent bleeding however the patient has not been on anticoagulation.   Current Activity/ Functional Status: The patient is disabled from working as a Lawyer. She has  a 36 year old daughter. Her family lives in [place] and [place].   Zubrod Score: At the time of surgery this patient's most appropriate activity status/level should be described as: []     0    Normal activity, no symptoms []     1    Restricted in physical strenuous activity but ambulatory, able to do out light work []     2    Ambulatory and capable of self care, unable to do work activities, up and about                 more than 50%  Of the time                            []     3    Only limited self care, in bed greater than 50% of waking hours [x]     4    Completely disabled, no self care, confined to bed or chair []     5    Moribund  Past Medical History  Diagnosis Date  . CHF (congestive heart failure)     a. EF 30-35%, RV mildly dilated (difficult study 11/2013) b. RHC (12/2013): RA 38/37 (35), RV 90/28, PA 95/51 (71), PCWP 54, PA 40%, AO 96 %, CO/CI Fick 3.2/1.4,  PVR 5.3   . Nonischemic cardiomyopathy 12/2013    a. LHC (12/2013) normal coronary anatomy   . Mitral regurgitation     moderate to severe  . LBBB (left bundle branch block)   . HTN (hypertension)   . Asthma   . IBS (irritable bowel syndrome)     with primarily constipation  . Morbid obesity   . GERD (gastroesophageal reflux disease)   . IDA (iron deficiency anemia)     2o TO SB AVMS, Hx parenteral iron Dr Mariel Sleet  . AVM (arteriovenous malformation)     Small bowel, s/p duble balloon enteroscopy/APC jejunum Dr Gwinda Passe [facility]) 01/23/2011  . Peripheral neuropathy   . OSA (obstructive sleep apnea)     on CPAP qhs  . Diabetes mellitus without complication   . Torsades de pointes     a. appropriate ICD therapy 12/2014 in setting of hypokalemia  . Pneumonia     Past Surgical History  Procedure Laterality Date    . Appendectomy    . Cholecystectomy      biliary dyskinesia  . Mastectomy  2003    left, partial  . Knee arthroscopy  2005    left  . Small bowel enteroscopy  MAY 2012 [facility] DR. GILLIAM    SB AVMS s/p APC  . Colonoscopy  OCT 2010/SEP 2011    tortuous colon, 3 polyps-benign(2010),   . Upper gastrointestinal endoscopy  '08, '10, SEP 2011    mild antral gastritis (2010), negative SB bx (2010), incomlpete Schatzki's ring (2011)  . Small bowel enteroscopy  SEP 2011 [facility]    Fields-NO AVMS  . Givens capsule study  NOV 2010     NO AVMS, normal  . Irrigation and debridement abscess  06/25/2012    Procedure: MINOR INCISION AND DRAINAGE OF ABSCESS;  Surgeon: [name], MD;  Location: [facility] ORS;  Service: General;  Laterality: [name];  Incision & Drainage of Infected Sebaceous Cyst on Chest  . Breast surgery    . Implantable cardioverter defibrillator generator change Left 02/15/2012    BSX CRTD   . Left and right heart catheterization with coronary/graft angiogram  12/12/2013    Procedure: LEFT AND RIGHT HEART CATHETERIZATION WITH Isabel Caprice;  Surgeon: Peter M Swaziland, MD;  Location: [facility] CATH LAB;  Service: Cardiovascular;;  . Right heart catheterization [name] 01/06/2015    Procedure: RIGHT HEART CATH;  Surgeon: Laurey Morale, MD;  Location: [facility] CATH LAB;  Service: Cardiovascular;  Laterality: [name];  . Esophagogastroduodenoscopy  Sept 2013    [facility]: diminutive duodenal polyp, polyp in gastric cardia, path with benign duodenal mucosa with focally prominent Brunner's glands. Fundic gland polyps.   . Cardiac catheterization [name] 03/31/2015    Procedure: Right Heart Cath;  Surgeon: Dolores Patty, MD;  Location: [facility] INVASIVE CV LAB;  Service: Cardiovascular;  Laterality: [name];    History  Smoking status  . Former Smoker -- 0.50 packs/day for 20 years  . Types: Cigarettes  . Quit date: 11/19/2009  Smokeless tobacco  . Former Neurosurgeon    Comment: Quit x 4 years    History   Alcohol Use No    History   Social History  . Marital Status: Single    Spouse Name: [name]  . Number of Children: 1  . Years of Education: [name]   Occupational History  . disabled    Social History Main Topics  . Smoking status: Former Smoker -- 0.50 packs/day for 20 years    Types:  Cigarettes    Quit date: 11/19/2009  . Smokeless tobacco: Former Neurosurgeon     Comment: Quit x 4 years  . Alcohol Use: No  . Drug Use: No  . Sexual Activity: Yes    Birth Control/ Protection: None   Other Topics Concern  . 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   Single- 1 daughter age 74.    Does not get regular exercise.      Allergies  Allergen Reactions  . Cymbalta [Duloxetine Hcl] Other (See Comments)    Only in a mild dose pt can tolerate  . Trazodone And Nefazodone Other (See Comments)    Nightmares   . Lyrica [Pregabalin] Swelling    Current Facility-Administered Medications  Medication Dose Route Frequency Provider Last Rate Last Dose  . acetaminophen (TYLENOL) tablet 650 mg  650 mg Oral Q4H PRN Dolores Patty, MD      . albuterol (PROVENTIL) (2.5 MG/3ML) 0.083% nebulizer solution 2 mL  2 mL Inhalation Q6H PRN Ripudeep K Rai, MD      . allopurinol (ZYLOPRIM) tablet 300 mg  300 mg Oral Daily [name], MD   300 mg at 04/05/15 0941  . amiodarone (PACERONE) tablet 200 mg  200 mg Oral BID Laurey Morale, MD      . antiseptic oral rinse (CPC / CETYLPYRIDINIUM CHLORIDE 0.05%) solution 7 mL  7 mL Mouth Rinse BID Dolores Patty, MD   7 mL at 04/05/15 2201  . aspirin EC tablet 81 mg  81 mg Oral Daily [name], MD   81 mg at 04/05/15 0941  . cyclobenzaprine (FLEXERIL) tablet 10 mg  10 mg Oral TID PRN [name], MD      . digoxin (LANOXIN) tablet 0.0625 mg  0.0625 mg Oral Daily Laurey Morale, MD      . docusate sodium (COLACE) capsule 200 mg  200 mg Oral BID Roma Kayser Schorr, NP   200 mg at 04/05/15 2157  . DULoxetine  (CYMBALTA) DR capsule 30 mg  30 mg Oral Daily [name], MD   30 mg at 04/05/15 0941  . ferrous sulfate tablet 325 mg  325 mg Oral TID [name], MD   325 mg at 04/05/15 2157  . furosemide (LASIX) injection 80 mg  80 mg Intravenous BID Laurey Morale, MD   80 mg at 04/05/15 1725  . gabapentin (NEURONTIN) capsule 600 mg  600 mg Oral QID Ripudeep K Rai, MD   600 mg at 04/05/15 2157  . heparin injection 5,000 Units  5,000 Units Subcutaneous 3 times per day [name], MD   5,000 Units at 04/06/15 0508  . insulin aspart (novoLOG) injection 0-5 Units  0-5 Units Subcutaneous QHS [name], MD   2 Units at 03/28/15 2238  . insulin aspart (novoLOG) injection 0-9 Units  0-9 Units Subcutaneous TID WC [name], MD   1 Units at 04/05/15 1725  . insulin detemir (LEVEMIR) injection 56 Units  56 Units Subcutaneous BID Marinda Elk, MD   56 Units at 04/05/15 2210  . isosorbide-hydrALAZINE (BIDIL) 20-37.5 MG per tablet 0.5 tablet  0.5 tablet Oral TID Laurey Morale, MD      . ivabradine [facility]) tablet 2.5 mg  2.5 mg Oral BID WC Ripudeep K Rai, MD   2.5 mg at 04/05/15 1725  . Linaclotide (LINZESS) capsule 290 mcg  290  mcg Oral Daily [name], MD   290 mcg at 04/05/15 0940  . magnesium oxide (MAG-OX) tablet 400 mg  400 mg Oral Daily [name], MD   400 mg at 04/05/15 0940  . metolazone (ZAROXOLYN) tablet 5 mg  5 mg Oral Once Laurey Morale, MD      . milrinone [facility]) 20 MG/100ML (0.2 mg/mL) infusion  0.375 mcg/kg/min Intravenous Continuous Dolores Patty, MD 14.3 mL/hr at 04/06/15 0934 0.375 mcg/kg/min at 04/06/15 0934  . neomycin-bacitracin-polymyxin (NEOSPORIN) ointment   Topical BID Rolan Lipa, NP   14.2 application at 04/05/15 2158  . ondansetron (ZOFRAN) injection 4 mg  4 mg Intravenous Q6H PRN Dolores Patty, MD      . oxyCODONE-acetaminophen (PERCOCET/ROXICET) 5-325 MG per tablet 1-2 tablet  1-2 tablet Oral Q4H PRN [name], MD   1  tablet at 04/05/15 2233  . pantoprazole (PROTONIX) EC tablet 40 mg  40 mg Oral Daily [name], MD   40 mg at 04/05/15 0940  . potassium chloride SA (K-DUR,KLOR-CON) CR tablet 40 mEq  40 mEq Oral TID Dolores Patty, MD   40 mEq at 04/05/15 2158  . ranolazine (RANEXA) 12 hr tablet 500 mg  500 mg Oral BID Laurey Morale, MD   500 mg at 04/05/15 2158  . spironolactone (ALDACTONE) tablet 25 mg  25 mg Oral BID [name], MD   25 mg at 04/05/15 1724  . temazepam (RESTORIL) capsule 30 mg  30 mg Oral QHS Ripudeep K Rai, MD   30 mg at 04/05/15 2201  . vitamin E capsule 400 Units  400 Units Oral Daily [name], MD   400 Units at 04/05/15 0941    Prescriptions prior to admission  Medication Sig Dispense Refill Last Dose  . albuterol (PROVENTIL HFA;VENTOLIN HFA) 108 (90 BASE) MCG/ACT inhaler Inhale 2 puffs into the lungs every 6 (six) hours as needed for wheezing or shortness of breath.   1 week ago  . allopurinol (ZYLOPRIM) 300 MG tablet Take 1 tablet (300 mg total) by mouth daily. 30 tablet 6 03/26/2015 at Unknown time  . amiodarone (PACERONE) 200 MG tablet Take 0.5 tablets (100 mg total) by mouth daily. 60 tablet 3 03/26/2015 at Unknown time  . aspirin EC 81 MG EC tablet Take 1 tablet (81 mg total) by mouth daily. 30 tablet 3 03/26/2015 at Unknown time  . carvedilol (COREG) 6.25 MG tablet Take 0.5 tablets (3.125 mg total) by mouth 2 (two) times daily with a meal. 60 tablet 3 03/26/2015 at 700  . CORLANOR 5 MG TABS tablet TAKE (1/2) TABLET BY MOUTH TWICE DAILY WITH A MEAL. 60 tablet 2 03/26/2015 at Unknown time  . cyclobenzaprine (FLEXERIL) 10 MG tablet Take 10 mg by mouth 3 (three) times daily as needed for muscle spasms.    1 week ago  . digoxin (LANOXIN) 0.125 MG tablet Take 0.5 tablets (0.0625 mg total) by mouth daily. 90 tablet 3 03/26/2015 at Unknown time  . DULoxetine (CYMBALTA) 30 MG capsule Take 30 mg by mouth daily.   03/26/2015 at Unknown time  . ferrous sulfate 325 (65 FE) MG  tablet Take 325 mg by mouth 3 (three) times daily.   03/26/2015 at Unknown time  . gabapentin (NEURONTIN) 300 MG capsule Take 600 mg by mouth 4 (four) times daily.   03/26/2015 at Unknown time  . insulin aspart (NOVOLOG) 100 UNIT/ML injection Inject 0-20 Units into the skin 3 (  three) times daily with meals. (Patient taking differently: Inject 0-20 Units into the skin 3 (three) times daily with meals. Per sliding scale: 120 - 150 = 2 units, 151 - 200 = 4 units, 201 - 250 = 7 units, 251 - 300 = 11 units, 301 - 350 = 15 units, 351 and above = 20 units) 1 vial 5 1 week ago  . insulin detemir (LEVEMIR) 100 UNIT/ML injection Inject 100 Units into the skin at bedtime.    03/25/2015 at Unknown time  . Linaclotide (LINZESS) 290 MCG CAPS capsule Take 1 capsule (290 mcg total) by mouth daily. 30 minutes before breakfast 30 capsule 5 03/26/2015 at Unknown time  . losartan (COZAAR) 25 MG tablet Take 0.5 tablets (12.5 mg total) by mouth every evening. 15 tablet 3 03/25/2015 at Unknown time  . magnesium oxide (MAG-OX) 400 (241.3 MG) MG tablet Take 1 tablet (400 mg total) by mouth daily. 30 tablet 6 03/26/2015 at Unknown time  . omeprazole (PRILOSEC) 40 MG capsule Take 1 capsule (40 mg total) by mouth daily. 30 capsule 6 03/26/2015 at Unknown time  . oxyCODONE-acetaminophen (PERCOCET) 10-325 MG per tablet Take 0.5-1 tablets by mouth every 4 (four) hours as needed for pain.    Past Week at Unknown time  . potassium chloride SA (K-DUR,KLOR-CON) 20 MEQ tablet Take 80 MEQ (4 tablets) am and 60 MEQ (3 tablets) pm 210 tablet 3 03/26/2015 at Unknown time  . spironolactone (ALDACTONE) 25 MG tablet Take 1 tablet (25 mg total) by mouth 2 (two) times daily. 60 tablet 3 03/26/2015 at Unknown time  . temazepam (RESTORIL) 30 MG capsule Take 30 mg by mouth at bedtime.   03/25/2015 at Unknown time  . torsemide (DEMADEX) 100 MG tablet Take 1 tablet (100 mg total) by mouth 2 (two) times daily. 90 tablet 0 03/26/2015 at Unknown time  . vitamin E  400 UNIT capsule Take 400 Units by mouth daily.   03/26/2015 at Unknown time  . allopurinol (ZYLOPRIM) 300 MG tablet TAKE 1 TABLET BY MOUTH ONCE DAILY. (Patient not taking: Reported on 03/26/2015) 30 tablet 0 Not Taking at Unknown time  . metolazone (ZAROXOLYN) 2.5 MG tablet Take 1 tablet (2.5 mg total) by mouth once a week. Take one tablet by mouth 30 minutes before taking morning dose of Torsemide on Tuesdays (Patient not taking: Reported on 03/01/2015) 10 tablet 3 Not Taking  . phenazopyridine (PYRIDIUM) 200 MG tablet Take 1 tablet (200 mg total) by mouth 3 (three) times daily. (Patient not taking: Reported on 03/01/2015) 6 tablet 0 Completed Course at Unknown time    Family History  Problem Relation Age of Onset  . Colon cancer Paternal Uncle   . Cervical cancer Mother   . Hypertension Mother   . Cancer Mother   . Heart disease Father   . Hypertension Father   . GER disease Father   . Heart attack Father   . Bleeding Disorder Father   . Diabetes Sister   . Heart disease Sister   . Hypertension Sister      Review of Systems:       Cardiac Review of Systems: Y or N  Chest Pain [  n  ]  Resting SOB [  y ] Exertional SOB  Cove.Etienne  ]  Orthopnea Cove.Etienne  ]   Pedal Edema [ y  ]    Palpitations Cove.Etienne  ] Syncope  Milo.Brash  ]   Presyncope Cove.Etienne   ]  General Review of Systems: [  Y] = yes [  ]=no Constitional: recent weight change [  ]; anorexia [  ]; fatigue Cove.Etienne  ]; nausea [  ]; night sweats [  ]; fever [  ]; or chills [  ]                                                               Dental: poor dentition[  ]; Last Dentist visit: 1 yr  Eye : blurred vision [  ]; diplopia [   ]; vision changes [  ];  Amaurosis fugax[  ]; Resp: cough [  ];  wheezing[  ];  hemoptysis[  ]; shortness of breath[  ]; paroxysmal nocturnal dyspnea[  ]; dyspnea on exertion[  ]; or orthopnea[  ];  GI:  gallstones[  ], vomiting[  ];  dysphagia[  ]; melena[  ];  hematochezia [  ]; heartburn[  ];   Hx of  Colonoscopy[y-2013  ];Status post  cholecystectomy 20 years ago  GU: kidney stones [  ]; hematuria[  ];   dysuria [  ];  nocturia[  ];  history of     obstruction [  ]; urinary frequency [  ]             Skin: rash, swelling[  ];, hair loss[  ];  peripheral edema[y  ];  or itching[  ]; Musculosketetal: myalgias[  ];  joint swelling[  ];  joint erythema[  ];  joint pain[  ];  back pain[  ];  Heme/Lymph: bruising[  ];  bleeding[y  ];  anemia[y  ];  Neuro: TIA[  ];  headaches[  ];  stroke[  ];  vertigo[  ];  seizures[  ];   paresthesias[  ];  difficulty walking[ y ];  Psych:depression[  ]; anxiety[  ];  Endocrine: diabetes[y  ];  thyroid dysfunction[  ];  Immunizations: Flu [  ]; Pneumococcal[  ];  Other:  Physical Exam: BP 109/75 mmHg  Pulse 76  Temp(Src) 97.7 F (36.5 C) (Oral)  Resp 18  Ht  (1.651 m)  Wt 280 lb 10.3 oz (127.3 kg)  BMI 46.70 kg/m2  SpO2 90%  LMP 01/19/2014       Physical Exam  General:  AA middle aged female sitting in chair in no distress HEENT: Normocephalic pupils equal , dentition adequate Neck: Supple without JVD, adenopathy, or bruit Chest: Clear to auscultation, symmetrical breath sounds, no rhonchi, no tenderness             or deformity Cardiovascular: Regular rate and rhythm, no murmur, no gallop, peripheral pulses            Diminished  , mild in all extremities Abdomen:  Soft, nontender, no palpable mass or organomegaly Extremities: Warm, well-perfused, no clubbing cyanosis, mild edema without tenderness,              no venous stasis changes of the legs Rectal/GU: Deferred Neuro: Grossly non--focal and symmetrical throughout Skin: Clean and dry without rash or ulceration   Diagnostic Studies & Laboratory data:     Recent Radiology Findings:   No results found.   I have independently reviewed the above radiologic studies.  Recent Lab Findings: Lab Results  Component Value Date   WBC 6.3 04/06/2015  HGB 15.0 04/06/2015   HCT 45.3 04/06/2015   PLT 280 04/06/2015     GLUCOSE 186* 04/06/2015   CHOL 195 06/21/2012   TRIG 233* 06/21/2012   HDL 45 06/21/2012   LDLCALC 103* 06/21/2012   ALT 23 03/26/2015   AST 32 03/26/2015   NA 129* 04/06/2015   K 4.2 04/06/2015   CL 80* 04/06/2015   CREATININE 1.66* 04/06/2015   BUN 43* 04/06/2015   CO2 38* 04/06/2015   TSH 4.204 11/23/2014   INR 1.25 03/31/2015   HGBA1C 7.5* 03/26/2015      Assessment / Plan:     Patient with progressive class IV CHF despite optimal medical therapy. Currently clinically improved on intravenous milrinone. She appears to be a potential candidate for implantable LVAD however her previous issues of upper GI bleeding from AVM is a potential contraindication. The patient will need repeat upper endoscopy and probably should be started on Coumadin to see how the medication is tolerated as intolerance of Coumadin is a contraindication to implantable LVAD  Her pulmonary status is marginal but probably acceptable for sternotomy Her obesity and diabetes are issues for increased risk of surgical infection but not absolute contraindications.  I would recommend proceeding with formal LVAD evaluation.     @ME1 @ 04/06/2015 9:37 AM

## 2015-04-06 NOTE — Progress Notes (Signed)
Physical Therapy Treatment Patient Details Name: [name] MRN: [id number] DOB: [birth year] Today's Date: 04/06/2015    History of Present Illness 49 year old female with morbid obesity, obstructive sleep apnea on CPAP, nonischemic cardiomyopathy, chronic systolic and diastolic CHF, EF 78-29% with grade 3 diastolic dysfunction per echo in March 2016, GERD, diabetes mellitus on insulin presented to ED with syncopal episode and progressive worsening shortness of breath. Syncopal episode due to V-tach catch by AICD. With ADHF on IV milrinoe and diuretics    PT Comments    Pt continues to be very pleasant and motivated to move. Able to tolerate increased distance of gait on RA with continued need for support for balance with ambulation. Pt educated for HEP as well as encouraged continued mobility with nursing staff.   Follow Up Recommendations  No PT follow up     Equipment Recommendations       Recommendations for Other Services       Precautions / Restrictions Precautions Precautions: Fall    Mobility  Bed Mobility               General bed mobility comments: not observed  Transfers Overall transfer level: Needs assistance   Transfers: Sit to/from Stand;Stand Pivot Transfers Sit to Stand: Supervision Stand pivot transfers: Supervision       General transfer comment: cues for controlled descent to chair with trials x 3 with cues pt able to safely descent  Ambulation/Gait Ambulation/Gait assistance: Supervision Ambulation Distance (Feet): 400 Feet Assistive device: None (pt utilizing IV pole for support) Gait Pattern/deviations: Step-through pattern;Decreased stride length   Gait velocity interpretation: Below normal speed for age/gender General Gait Details: pt denied use of RW today but continued to require assist of IV pole for stability and recommend continued use of RW for balance and stamina   Stairs            Wheelchair Mobility    Modified  Rankin (Stroke Patients Only)       Balance Overall balance assessment: Needs assistance   Sitting balance-[name]: Good       Standing balance-[name]: Fair                      Cognition Arousal/Alertness: Awake/alert Behavior During Therapy: WFL for tasks assessed/performed Overall Cognitive Status: Within Functional Limits for tasks assessed                      Exercises General Exercises - Lower Extremity Long Arc Quad: AROM;Seated;Both;20 reps Hip Flexion/Marching: AROM;Seated;Both;20 reps    General Comments        Pertinent Vitals/Pain Pain Assessment: No/denies pain  HR 78-86 with mobility sats 91-96% on RA 103/60 sitting     Home Living                      Prior Function            PT Goals (current goals can now be found in the care plan section) Progress towards PT goals: Progressing toward goals    Frequency       PT Plan Current plan remains appropriate    Co-evaluation             End of Session Equipment Utilized During Treatment: Gait belt Activity Tolerance: Patient tolerated treatment well Patient left: in chair;with call bell/phone within reach;with chair alarm set;with nursing/sitter in room     Time: 5621-3086 PT Time Calculation (min) (  ACUTE ONLY): 28 min  Charges:  $Gait Training: 8-22 mins $Therapeutic Exercise: 8-22 mins                    G Codes:      Delorse Lek April 23, 2015, 12:06 PM Delaney Meigs, PT [phone]

## 2015-04-06 NOTE — Progress Notes (Signed)
LVAD Coordinator Note:  Met with patient again tonight after her discussion with Dr. Prescott Gum. She would also like to proceed with LVAD evaluation for candidacy. Reviewed and signed her evaluation consent.   Discussed in depth how important the caregiver role is to be successful with this surgery. Spoke with her fiance Konrad Dolores over the phone and scheduled a meeting with Zada Girt tomorrow 04/07/15 @ 1600 to review and sign caregiver consent.   Orders placed to proceed with complete surgical evaluation. Will continue to obtain outpatient records from [facility] for previous AVM repairs; patient also reports having had previous mammograms at [facility] for fibrocystic breast tissue.  She has already met with palliative care medicine team per her request and is working on [facility] and will meet with Raquel Sarna tomorrow.   She will be going home on Milrinone therapy and followed in CHF clinic where [facility] and myself will continue to follow outpatient.   Janene Madeira, RN VAD Coordinator   Office: [phone] 24/7 VAD Pager: [phone]

## 2015-04-06 NOTE — Progress Notes (Signed)
[facility] PROGRESS NOTE Interim History: 49 year old female with morbid obesity, obstructive sleep apnea on CPAP, nonischemic cardiomyopathy, chronic systolic and diastolic CHF, EF 16-10% with grade 3 diastolic dysfunction per echo in March 2016, GERD, diabetes mellitus on insulin presented to ED with syncopal episode and progressive worsening shortness of breath. Syncopal episode due to V-tach catch by AICD. With ADHF on IV milrinoe and diuretics. Has diurese well, now on oral torsemide. Had few episode of VT/VF on 7.23.2016. Loaded with amio.  Filed Weights   04/04/15 0417 04/05/15 0300 04/06/15 0324  Weight: 119.9 kg (264 lb 5.3 oz) 125.3 kg (276 lb 3.8 oz) 127.3 kg (280 lb 10.3 oz)        Intake/Output Summary (Last 24 hours) at 04/06/15 9604 Last data filed at 04/06/15 0700  Gross per 24 hour  Intake   2064 ml  Output   2800 ml  Net   -736 ml     Assessment/Plan: Syncope/ VT/VF in the setting of hypokalemia on admisison: She was resuscitated with an ICD shock, recurrent VF/VT on 7.23.2016 and shock. Appreciate cards assistance. She was loaded with amio on 7.24.2016. With no events overnight.  Acute on chronic combined systolic and diastolic heart failure/Cardiogenic shock/nonischemic cardiomyopathy: Changed to IV lasix. Weight has increased. With poor UOP. Cr. Is raising, CVP is high, further management per cards. Advance HF team recommended Sindenafil. RHC performed on 7.21.2016,now on milrinone. PICC line place on 7.22.2016 Cont lytes replacement.   Insulin-dependent diabetes mellitus: With several episodes of hypoglycemia on admission, ? Due to worsening renal function. Her A1c was 7.5. BG raising slowly, cont titrate long acting.  GERD: Continue Protonix.  Essential hypertension: Continue Coreg, losartan, metolazone and Lasix.  Her blood pressure is close to goal.  Atypical chest pain: CT imaging of the chest negative for PE, ? Due to ADHF.  Morbid  obesity: Counseling.  Code Status: full Family Communication: none  Disposition Plan: home in 3-5 days   Consultants:  cardiology  Procedures: ECHO: none   Antibiotics:  None  HPI/Subjective: No complains, in a better mood today.  Objective: Filed Vitals:   04/05/15 2040 04/05/15 2335 04/06/15 0000 04/06/15 0324  BP: 117/60 102/45 98/59 126/77  Pulse:      Temp: 97.9 F (36.6 C) 98.1 F (36.7 C)  98 F (36.7 C)  TempSrc: Oral Oral  Oral  Resp: Height:      Weight:    127.3 kg (280 lb 10.3 oz)  SpO2: 96% 92% 97% 97%     Exam:  General: Alert, awake, oriented x3, in no acute distress.  HEENT: No bruits, no goiter.  Heart: Regular rate and rhythm.  Lungs: Good air movement, clear Neuro: Grossly intact, nonfocal.   Data Reviewed: Basic Metabolic Panel:  Recent Labs Lab 03/31/15 1114  04/02/15 0510 04/03/15 0523 04/04/15 0431 04/05/15 0515 04/05/15 1515 04/06/15 0515  NA  --   < > 135 131* 131* 129* 128* 129*  K  --   < > 4.5 3.9 4.6 4.4 4.0 4.2  CL  --   < > 84* 80* 82* 81* 80* 80*  CO2  --   < > 42* 40* 40* 38* 35* 38*  GLUCOSE  --   < > 135* 232* 166* 249* 274* 186*  BUN  --   < > 32* 34* 37* 40* 42* 43*  CREATININE  --   < > 1.51* 1.23* 1.33* 1.42* 1.47* 1.66*  CALCIUM  --   < >  9.0 8.9 8.9 8.9 9.0 9.0  MG 2.2  --  2.0 2.1  --  2.1  --   --   < > = values in this interval not displayed. Liver Function Tests: No results for input(s): AST, ALT, ALKPHOS, BILITOT, PROT, ALBUMIN in the last 168 hours. No results for input(s): LIPASE, AMYLASE in the last 168 hours. No results for input(s): AMMONIA in the last 168 hours. CBC:  Recent Labs Lab 03/31/15 1207 04/05/15 0515 04/06/15 0515  WBC 5.2 7.1 6.3  HGB 15.0 15.0 15.0  HCT 46.0 45.9 45.3  MCV 93.7 94.1 93.6  PLT 239 279 280   Cardiac Enzymes: No results for input(s): CKTOTAL, CKMB, CKMBINDEX, TROPONINI in the last 168 hours. BNP (last 3 results)  Recent Labs   01/20/15 0040 01/26/15 1100 03/26/15 1633  BNP 166.0* 223.8* 549.5*    ProBNP (last 3 results)  Recent Labs  08/04/14 2339 08/21/14 1117  PROBNP 1727.0* 171.9*    CBG:  Recent Labs Lab 04/04/15 2147 04/05/15 0821 04/05/15 1137 04/05/15 1701 04/05/15 2209  GLUCAP 195* 170* 221* 146* 186*    Recent Results (from the past 240 hour(s))  MRSA PCR Screening     Status: None   Collection Time: 04/02/15 11:45 AM  Result Value Ref Range Status   MRSA by PCR NEGATIVE NEGATIVE Final    Comment:        The GeneXpert MRSA Assay (FDA approved for NASAL specimens only), is one component of a comprehensive MRSA colonization surveillance program. It is not intended to diagnose MRSA infection nor to guide or monitor treatment for MRSA infections.      Studies: No results found.  Scheduled Meds: . allopurinol  300 mg Oral Daily  . antiseptic oral rinse  7 mL Mouth Rinse BID  . aspirin EC  81 mg Oral Daily  . digoxin  0.0625 mg Oral Daily  . docusate sodium  200 mg Oral BID  . DULoxetine  30 mg Oral Daily  . ferrous sulfate  325 mg Oral TID  . furosemide  80 mg Intravenous BID  . gabapentin  600 mg Oral QID  . heparin  5,000 Units Subcutaneous 3 times per day  . insulin aspart  0-5 Units Subcutaneous QHS  . insulin aspart  0-9 Units Subcutaneous TID WC  . insulin detemir  56 Units Subcutaneous BID  . ivabradine  2.5 mg Oral BID WC  . Linaclotide  290 mcg Oral Daily  . losartan  12.5 mg Oral QPM  . magnesium oxide  400 mg Oral Daily  . neomycin-bacitracin-polymyxin   Topical BID  . pantoprazole  40 mg Oral Daily  . potassium chloride  40 mEq Oral TID  . ranolazine  500 mg Oral BID  . spironolactone  25 mg Oral BID  . temazepam  30 mg Oral QHS  . vitamin E  400 Units Oral Daily   Continuous Infusions: . amiodarone 30 mg/hr (04/06/15 0023)  . milrinone 0.375 mcg/kg/min (04/06/15 0205)     Marinda Elk  [facility] Pager [phone] If 7PM-7AM,  please contact night-coverage at [email or web address], password [facility] 04/06/2015, 7:16 AM  LOS: 10 days

## 2015-04-06 NOTE — Progress Notes (Signed)
CARDIAC REHAB PHASE I   PRE:  Rate/Rhythm: 76 paced  BP:  Sitting: 105/72        SaO2: 95 RA  MODE:  Ambulation: 270 ft   POST:  Rate/Rhythm:79 paced  BP:  Sitting: 97/73         SaO2: 94 RA  Pt eager to ambulate. Pt ambulated approximately 50 ft and had to return to room and sit. Pt states she felt "funny in her head" and felt like she was going to pass out. Pt sat on side of bed briefly, VSS (HR 82 paced, BP 113/82, O2 92 on RA), states she is ready to try to walk again.  Pt ambulated 270 ft on RA, gait belt, IV, rolling walker, assist x2 (followed with recliner), tolerated well. Pt denies CP, dizziness, DOE, declined rest stop, did c/o mild fatigue upon returning to room. Pt sats 88-92% on RA while ambulating. Pt to recliner after walk, call bell within reach. Will continue to follow.   [id number]  Joylene Grapes, RN, BSN 04/06/2015 3:33 PM

## 2015-04-06 NOTE — Consult Note (Signed)
   [facility] CM Inpatient Consult   04/06/2015  [name] 01/22/1966 [id number] Patient is currently active [prior to [place] with [facility] for chronic disease management services.  Patient has been engaged by a [employer]. Care Coordinator has  focused on disease management and community resource support. Met with the patient who had moved from 2H14 to 2H17.  Patient was sitting in recliner.  She states that there has been some talk about a possible transplant.  She also states she will be on new medications.  She endorses to ongoing follow up with [facility] for post hospital monitoring.  Will update inpatient RNCM of [facility] involvement.   Of note, [facility] Care Management services does not replace or interfere with any services that are arranged by inpatient case management or social work.  For additional questions or referrals please contact: Natividad Brood, RN BSN [facility] Liaison  [phone] business mobile phone

## 2015-04-06 NOTE — Progress Notes (Signed)
[facility] EDUCATION NOTE:   Met with Ashok Pall to discuss LVAD support per Dr. Claris Gladden request. VAD educational packet including "HM II Patient Handbook", "HM II Left Ventricular Assist System" packet, and "[facility] HM II Patient Education" reviewed in detail with me and left at bedside for continued reference. Preliminarily went over what the therapy is indicated for, showed equipment, discussed long-term maintenance of therapy and lifestyle limitations including long term coumadin. Explained that LVAD can be implanted for two indications in the setting of advanced left ventricular heart failure treatment:  1. Bridge to transplant - used for patients who cannot safely wait for heart transplant without this device.  Or   2. Destination therapy - used for patients until end of life or recovery of heart function.  After briefly reviewing her chart for candidacy, EF 30 - 35% based on recent echo, BMI 46, and history of AVM's with double balloon enteroscopy and intervention are possible limiting factors. Will discuss her case with VAD MRB team tonight and have Dr. Prescott Gum see her in the morning.    All questions have been answered at this time and contact information was provided should they encounter any further questions. Emphasized that the consideration for evaluation does not mean that we will implant, only that we will consider her for potential candidacy with consideration of her comprehensive medical story.   Will come back and visit with patient tomorrow and discuss evaluation in depth if the team wishes to proceed.    Total Session Time: 30 minutes  Janene Madeira, RN VAD Coordinator   Office: [phone] 24/7 VAD Pager: [phone]

## 2015-04-07 ENCOUNTER — Encounter: Payer: Self-pay | Admitting: *Deleted

## 2015-04-07 ENCOUNTER — Inpatient Hospital Stay (HOSPITAL_COMMUNITY): Payer: Medicare Other

## 2015-04-07 ENCOUNTER — Inpatient Hospital Stay (HOSPITAL_COMMUNITY): Admission: RE | Admit: 2015-04-07 | Payer: Medicare Other | Source: Ambulatory Visit

## 2015-04-07 DIAGNOSIS — Z01810 Encounter for preprocedural cardiovascular examination: Secondary | ICD-10-CM

## 2015-04-07 DIAGNOSIS — K5900 Constipation, unspecified: Secondary | ICD-10-CM

## 2015-04-07 DIAGNOSIS — I509 Heart failure, unspecified: Secondary | ICD-10-CM

## 2015-04-07 DIAGNOSIS — Z8679 Personal history of other diseases of the circulatory system: Secondary | ICD-10-CM

## 2015-04-07 DIAGNOSIS — I5022 Chronic systolic (congestive) heart failure: Secondary | ICD-10-CM

## 2015-04-07 LAB — GLUCOSE, CAPILLARY
GLUCOSE-CAPILLARY: 120 mg/dL — AB (ref 65–99)
GLUCOSE-CAPILLARY: 101 mg/dL — AB (ref 65–99)
GLUCOSE-CAPILLARY: 134 mg/dL — AB (ref 65–99)
Glucose-Capillary: 129 mg/dL — ABNORMAL HIGH (ref 65–99)

## 2015-04-07 LAB — BASIC METABOLIC PANEL
Anion gap: 9 (ref 5–15)
BUN: 44 mg/dL — ABNORMAL HIGH (ref 6–20)
CO2: 34 mmol/L — AB (ref 22–32)
Calcium: 8.7 mg/dL — ABNORMAL LOW (ref 8.9–10.3)
Chloride: 84 mmol/L — ABNORMAL LOW (ref 101–111)
Creatinine, Ser: 1.39 mg/dL — ABNORMAL HIGH (ref 0.44–1.00)
GFR, EST AFRICAN AMERICAN: 51 mL/min — AB (ref >60)
GFR, EST NON AFRICAN AMERICAN: 44 mL/min — AB (ref >60)
Glucose, Bld: 194 mg/dL — ABNORMAL HIGH (ref 65–99)
Potassium: 4.3 mmol/L (ref 3.5–5.1)
SODIUM: 127 mmol/L — AB (ref 135–145)

## 2015-04-07 LAB — CBC
HCT: 43.2 % (ref 36.0–46.0)
Hemoglobin: 14.4 g/dL (ref 12.0–15.0)
MCH: 30.9 pg (ref 26.0–34.0)
MCHC: 33.3 g/dL (ref 30.0–36.0)
MCV: 92.7 fL (ref 78.0–100.0)
PLATELETS: 264 10*3/uL (ref 150–400)
RBC: 4.66 MIL/uL (ref 3.87–5.11)
RDW: 15.3 % (ref 11.5–15.5)
WBC: 5.6 10*3/uL (ref 4.0–10.5)

## 2015-04-07 LAB — LIPID PANEL
CHOLESTEROL: 238 mg/dL — AB (ref 0–200)
HDL: 65 mg/dL (ref >40)
LDL Cholesterol: 141 mg/dL — ABNORMAL HIGH (ref 0–99)
Total CHOL/HDL Ratio: 3.7 RATIO
Triglycerides: 162 mg/dL — ABNORMAL HIGH (ref <150)
VLDL: 32 mg/dL (ref 0–40)

## 2015-04-07 LAB — HIV ANTIBODY (ROUTINE TESTING W REFLEX): HIV Screen 4th Generation wRfx: NONREACTIVE

## 2015-04-07 LAB — OCCULT BLOOD X 1 CARD TO LAB, STOOL: Fecal Occult Bld: NEGATIVE

## 2015-04-07 LAB — HEPATITIS B SURFACE ANTIBODY,QUALITATIVE: Hep B S Ab: REACTIVE

## 2015-04-07 LAB — PREALBUMIN: Prealbumin: 31.5 mg/dL (ref 18–38)

## 2015-04-07 LAB — CARBOXYHEMOGLOBIN
CARBOXYHEMOGLOBIN: 1.5 % (ref 0.5–1.5)
METHEMOGLOBIN: 1.2 % (ref 0.0–1.5)
O2 Saturation: 56.5 %
TOTAL HEMOGLOBIN: 14.3 g/dL (ref 12.0–16.0)

## 2015-04-07 LAB — T4, FREE: Free T4: 1.42 ng/dL — ABNORMAL HIGH (ref 0.61–1.12)

## 2015-04-07 LAB — HEPATITIS B CORE ANTIBODY, TOTAL: Hep B Core Total Ab: NEGATIVE

## 2015-04-07 LAB — HEPATITIS C ANTIBODY: HCV Ab: <0.1 s/co ratio (ref 0.0–0.9)

## 2015-04-07 LAB — TSH: TSH: 4.432 u[IU]/mL (ref 0.350–4.500)

## 2015-04-07 LAB — MAGNESIUM: Magnesium: 2.3 mg/dL (ref 1.7–2.4)

## 2015-04-07 MED ORDER — GLYCERIN (LAXATIVE) 2.1 G RE SUPP
1.0000 | Freq: Once | RECTAL | Status: AC
  Administered 2015-04-07: 1 via RECTAL
  Filled 2015-04-07: qty 1

## 2015-04-07 MED ORDER — TORSEMIDE 100 MG PO TABS
100.0000 mg | ORAL_TABLET | Freq: Two times a day (BID) | ORAL | Status: DC
  Administered 2015-04-07 – 2015-04-08 (×3): 100 mg via ORAL
  Filled 2015-04-07 (×5): qty 1

## 2015-04-07 MED ORDER — INSULIN DETEMIR 100 UNIT/ML ~~LOC~~ SOLN
58.0000 [IU] | Freq: Two times a day (BID) | SUBCUTANEOUS | Status: DC
  Administered 2015-04-07 – 2015-04-08 (×3): 58 [IU] via SUBCUTANEOUS
  Filled 2015-04-07 (×4): qty 0.58

## 2015-04-07 MED ORDER — PERFLUTREN LIPID MICROSPHERE
INTRAVENOUS | Status: AC
  Administered 2015-04-07: 4 mL
  Filled 2015-04-07: qty 10

## 2015-04-07 MED ORDER — SILDENAFIL CITRATE 20 MG PO TABS
20.0000 mg | ORAL_TABLET | Freq: Three times a day (TID) | ORAL | Status: DC
  Administered 2015-04-07 – 2015-04-08 (×4): 20 mg via ORAL
  Filled 2015-04-07 (×6): qty 1

## 2015-04-07 MED ORDER — HYDRALAZINE HCL 25 MG PO TABS
25.0000 mg | ORAL_TABLET | Freq: Three times a day (TID) | ORAL | Status: DC
  Administered 2015-04-07 – 2015-04-08 (×5): 25 mg via ORAL
  Filled 2015-04-07 (×7): qty 1

## 2015-04-07 NOTE — Consult Note (Signed)
Referring Provider: Dr. 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 GI eval before LVAD.      HPI: [name] is a 49 y.o. female with a history of congestive heart failure and nonischemic cardiomyopathy with a defibrillator. She has a history of diabetes mellitus on insulin, GERD, obstructive sleep apnea on Cipro, morbid obesity, chronic systolic and diastolic CHF, ejection fraction 30-35% with grade 3 diastolic dysfunction per echo in March 2016. She was admitted on July 15. She had noticed that she was gaining weight an worsening shortness of breath. She had called the nursing line and will talk to the nurse had a syncopal episode. Patient was taken to the ER where she was noted to have a troponin 0, BNP 549, creatinine 1.2. Chest x-ray revealed cardiomegaly and mild pulmonary vascular congestion. She is currently on milrinone and diuretics. She has been evaluated by cardiology and is felt to be a poor candidate for transplant given her morbid obesity. She has been evaluated for LVAD as it was felt long-term milrinone was not a good option in light of her ventricular ectopy, history of DVT and ICD shocks. GI evaluation has been requested prior to LVAD.  [name] has a history of recurrent iron deficiency anemia. She has been followed by [name] and [name] at [facility] and was referred to [name] at [name]. Her workup at [facility] consisted of capsule endoscopy, colonoscopy, and EGD which did not reveal a source for the patient's recurrent transfusion dependent iron deficiency anemia. She had a CT enterography performed at [name] that did not reveal a source. Repeat capsule study indicated a bleeding jejunal source. 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  Patient had iron transfusions for a while and did well. She states she had a colonoscopy in 2000 or 2002 by [name] in [place] at which time a small polyp was removed. Over the next few years she had 3 colonoscopies by [name] as part of a workup for iron deficiency anemia. She states her last colonoscopy was at [name] approximately 5 years ago. She states she was told she had no polyps and that she should repeat her colonoscopy in 10 years. She was seen at [name] earlier this year for constipation and was started on Linzess, which has provided good relief. Since her hospitalization, she has been constipated and she feels this is due to decreased activity and pain medication. Otherwise, she denies heartburn, epigastric pain, nausea, vomiting, melena or bright red blood per rectum. She has continued taking oral iron.   Past Medical History  Diagnosis Date  . CHF (congestive heart failure)     a. EF 30-35%, RV mildly dilated (difficult study 11/2013) b. RHC (12/2013): RA 38/37 (35), RV 90/28, PA 95/51 (71), PCWP 54, PA 40%, AO 96 %, CO/CI Fick 3.2/1.4, PVR 5.3   . Nonischemic cardiomyopathy 12/2013    a. LHC (12/2013) normal coronary anatomy   . Mitral regurgitation     moderate to severe  . LBBB (left bundle branch block)   . HTN (hypertension)   . Asthma   . IBS (irritable bowel syndrome)     with primarily constipation  . Morbid obesity   . GERD (gastroesophageal reflux disease)   . IDA (iron deficiency anemia)  2o TO SB AVMS, Hx parenteral iron Barbara Mariel [name]  . AVM (arteriovenous malformation)     Small bowel, s/p duble balloon enteroscopy/APC jejunum Barbara Gwinda [name]) 01/23/2011  . Peripheral neuropathy   . OSA (obstructive sleep apnea)     on CPAP qhs  . Diabetes mellitus without complication   . Torsades de pointes     a. appropriate ICD therapy 12/2014 in setting of hypokalemia  . Pneumonia     Past Surgical History  Procedure Laterality Date  .  Appendectomy    . Cholecystectomy      biliary dyskinesia  . Mastectomy  2003    left, partial  . Knee arthroscopy  2005    left  . Small bowel enteroscopy  MAY 2012 [facility] [name]    SB AVMS s/p APC  . Colonoscopy  OCT 2010/SEP 2011    tortuous colon, 3 polyps-benign(2010),   . Upper gastrointestinal endoscopy  '08, '10, SEP 2011    mild antral gastritis (2010), negative SB bx (2010), incomlpete Schatzki's ring (2011)  . Small bowel enteroscopy  SEP 2011 [facility]    Fields-NO AVMS  . Givens capsule study  NOV 2010     NO AVMS, normal  . Irrigation and debridement abscess  06/25/2012    Procedure: MINOR INCISION AND DRAINAGE OF ABSCESS;  Surgeon: Barbara Hatcher, [name];  Location: [facility];  Service: General;  Laterality: [name];  Incision & Drainage of Infected Sebaceous Cyst on Chest  . Breast surgery    . Implantable cardioverter defibrillator generator change Left 02/15/2012    BSX CRTD   . Left and right heart catheterization with coronary/graft angiogram  12/12/2013    Procedure: LEFT AND RIGHT HEART CATHETERIZATION WITH Isabel Caprice;  Surgeon: Barbara M [place], [name];  Location: [facility] CATH LAB;  Service: Cardiovascular;;  . Right heart catheterization [name] 01/06/2015    Procedure: RIGHT HEART CATH;  Surgeon: [name];  Location: [name] CATH LAB;  Service: Cardiovascular;  Laterality: [name];  . Esophagogastroduodenoscopy  Sept 2013    [facility]: diminutive duodenal polyp, polyp in gastric cardia, path with benign duodenal mucosa with focally prominent Brunner's glands. Fundic gland polyps.   . Cardiac catheterization [name] 03/31/2015    Procedure: Right Heart Cath;  Surgeon: [name];  Location: [facility] INVASIVE CV LAB;  Service: Cardiovascular;  Laterality: [name];    Prior to Admission medications   Medication Sig Start Date End Date Taking? Authorizing Provider  albuterol (PROVENTIL HFA;VENTOLIN HFA) 108 (90 BASE) MCG/ACT inhaler Inhale 2 puffs into the lungs every 6  (six) hours as needed for wheezing or shortness of breath.   Yes Historical Provider, [name]  allopurinol (ZYLOPRIM) 300 MG tablet Take 1 tablet (300 mg total) by mouth daily. 08/14/14  Yes [name] D Clegg, NP  amiodarone (PACERONE) 200 MG tablet Take 0.5 tablets (100 mg total) by mouth daily. 01/11/15  Yes Abelino Derrick, PA-C  aspirin EC 81 MG EC tablet Take 1 tablet (81 mg total) by mouth daily. 12/16/13  Yes Aundria Rud, NP  carvedilol (COREG) 6.25 MG tablet Take 0.5 tablets (3.125 mg total) by mouth 2 (two) times daily with a meal. 01/26/15  Yes [name] D Clegg, NP  CORLANOR 5 MG TABS tablet TAKE (1/2) TABLET BY MOUTH TWICE DAILY WITH A MEAL. 03/19/15  Yes [name]  cyclobenzaprine (FLEXERIL) 10 MG tablet Take 10 mg by mouth 3 (three) times daily as needed for muscle spasms.  Yes Historical Provider, [name]  digoxin (LANOXIN) 0.125 MG tablet Take 0.5 tablets (0.0625 mg total) by mouth daily. 01/01/15  Yes [name] D Clegg, NP  DULoxetine (CYMBALTA) 30 MG capsule Take 30 mg by mouth daily. 02/24/15  Yes Historical Provider, [name]  ferrous sulfate 325 (65 FE) MG tablet Take 325 mg by mouth 3 (three) times daily.   Yes Historical Provider, [name]  gabapentin (NEURONTIN) 300 MG capsule Take 600 mg by mouth 4 (four) times daily.   Yes Historical Provider, [name]  insulin aspart (NOVOLOG) 100 UNIT/ML injection Inject 0-20 Units into the skin 3 (three) times daily with meals. Patient taking differently: Inject 0-20 Units into the skin 3 (three) times daily with meals. Per sliding scale: 120 - 150 = 2 units, 151 - 200 = 4 units, 201 - 250 = 7 units, 251 - 300 = 11 units, 301 - 350 = 15 units, 351 and above = 20 units 06/28/12  Yes Gareth [facility], [name]  insulin detemir (LEVEMIR) 100 UNIT/ML injection Inject 100 Units into the skin at bedtime.    Yes Historical Provider, [name]  Linaclotide (LINZESS) 290 MCG CAPS capsule Take 1 capsule (290 mcg total) by mouth daily. 30 minutes before breakfast 02/12/15  Yes Nira Retort, NP  losartan  (COZAAR) 25 MG tablet Take 0.5 tablets (12.5 mg total) by mouth every evening. 02/02/15  Yes [name]  magnesium oxide (MAG-OX) 400 (241.3 MG) MG tablet Take 1 tablet (400 mg total) by mouth daily. 01/01/15  Yes [name] D Clegg, NP  omeprazole (PRILOSEC) 40 MG capsule Take 1 capsule (40 mg total) by mouth daily. 03/27/13  Yes Marinus Maw, [name]  oxyCODONE-acetaminophen (PERCOCET) 10-325 MG per tablet Take 0.5-1 tablets by mouth every 4 (four) hours as needed for pain.    Yes Historical Provider, [name]  potassium chloride SA (K-DUR,KLOR-CON) 20 MEQ tablet Take 80 MEQ (4 tablets) am and 60 MEQ (3 tablets) pm 02/02/15  Yes [name]  spironolactone (ALDACTONE) 25 MG tablet Take 1 tablet (25 mg total) by mouth 2 (two) times daily. 01/01/15  Yes [name] D Clegg, NP  temazepam (RESTORIL) 30 MG capsule Take 30 mg by mouth at bedtime.   Yes Historical Provider, [name]  torsemide (DEMADEX) 100 MG tablet Take 1 tablet (100 mg total) by mouth 2 (two) times daily. 02/02/15  Yes [name]  vitamin E 400 UNIT capsule Take 400 Units by mouth daily.   Yes Historical Provider, [name]  allopurinol (ZYLOPRIM) 300 MG tablet TAKE 1 TABLET BY MOUTH ONCE DAILY. Patient not taking: Reported on 03/26/2015 03/05/15   [name] D Clegg, NP  metolazone (ZAROXOLYN) 2.5 MG tablet Take 1 tablet (2.5 mg total) by mouth once a week. Take one tablet by mouth 30 minutes before taking morning dose of Torsemide on Tuesdays Patient not taking: Reported on 03/01/2015 01/11/15   Abelino Derrick, PA-C  phenazopyridine (PYRIDIUM) 200 MG tablet Take 1 tablet (200 mg total) by mouth 3 (three) times daily. Patient not taking: Reported on 03/01/2015 01/20/15   Azalia Bilis, [name]    Current Facility-Administered Medications  Medication Dose Route Frequency Provider Last Rate Last Dose  . acetaminophen (TYLENOL) tablet 650 mg  650 mg Oral Q4H PRN [name]      . albuterol (PROVENTIL) (2.5 MG/3ML) 0.083% nebulizer solution 2 mL  2 mL  Inhalation Q6H PRN Ripudeep K Rai, [name]      . allopurinol (ZYLOPRIM) tablet 300 mg  300 mg Oral Daily [name], [name]   300 mg at 04/07/15 0907  . amiodarone (PACERONE) tablet 200 mg  200 mg Oral BID [name]   200 mg at 04/07/15 1610  . antiseptic oral rinse (CPC / CETYLPYRIDINIUM CHLORIDE 0.05%) solution 7 mL  7 mL Mouth Rinse BID [name]   7 mL at 04/07/15 1000  . aspirin EC tablet 81 mg  81 mg Oral Daily [name], [name]   81 mg at 04/07/15 0906  . cyclobenzaprine (FLEXERIL) tablet 10 mg  10 mg Oral TID PRN [name], [name]      . digoxin (LANOXIN) tablet 0.0625 mg  0.0625 mg Oral Daily [name]   0.0625 mg at 04/07/15 0908  . docusate sodium (COLACE) capsule 200 mg  200 mg Oral BID Roma Kayser Schorr, NP   200 mg at 04/07/15 0907  . DULoxetine (CYMBALTA) Barbara capsule 30 mg  30 mg Oral Daily [name], [name]   30 mg at 04/07/15 0908  . ferrous sulfate tablet 325 mg  325 mg Oral TID [name], [name]   325 mg at 04/07/15 0906  . gabapentin (NEURONTIN) capsule 600 mg  600 mg Oral QID Ripudeep K Rai, [name]   600 mg at 04/07/15 0908  . heparin injection 5,000 Units  5,000 Units Subcutaneous 3 times per day [name], [name]   5,000 Units at 04/07/15 0501  . hydrALAZINE (APRESOLINE) tablet 25 mg  25 mg Oral 3 times per day [name]   25 mg at 04/07/15 0908  . insulin aspart (novoLOG) injection 0-5 Units  0-5 Units Subcutaneous QHS [name], [name]   2 Units at 03/28/15 2238  . insulin aspart (novoLOG) injection 0-9 Units  0-9 Units Subcutaneous TID WC [name], [name]   1 Units at 04/07/15 0900  . insulin detemir (LEVEMIR) injection 58 Units  58 Units Subcutaneous BID Marinda Elk, [name]   58 Units at 04/07/15 1038  . ivabradine (CORLANOR) tablet 2.5 mg  2.5 mg Oral BID WC Ripudeep K Rai, [name]   2.5 mg at 04/07/15 0900  . Linaclotide (LINZESS) capsule 290 mcg  290 mcg Oral Daily [name], [name]   290 mcg at 04/07/15 0908  . magnesium  oxide (MAG-OX) tablet 400 mg  400 mg Oral Daily [name], [name]   400 mg at 04/07/15 0909  . milrinone (PRIMACOR) 20 MG/100ML (0.2 mg/mL) infusion  0.375 mcg/kg/min Intravenous Continuous Bevelyn Buckles Bensimhon, [name] 14.3 mL/hr at 04/07/15 1042 0.375 mcg/kg/min at 04/07/15 1042  . neomycin-bacitracin-polymyxin (NEOSPORIN) ointment   Topical BID Rolan Lipa, NP   14.2 application at 04/06/15 2108  . ondansetron (ZOFRAN) injection 4 mg  4 mg Intravenous Q6H PRN [name]      . oxyCODONE-acetaminophen (PERCOCET/ROXICET) 5-325 MG per tablet 1-2 tablet  1-2 tablet Oral Q4H PRN [name], [name]   1 tablet at 04/06/15 1000  . pantoprazole (PROTONIX) EC tablet 40 mg  40 mg Oral Daily [name], [name]   40 mg at 04/07/15 0908  . potassium chloride SA (K-DUR,KLOR-CON) CR tablet 40 mEq  40 mEq Oral TID [name]   40 mEq at 04/07/15 0906  . ranolazine (RANEXA) 12 hr tablet 500 mg  500 mg Oral BID [name]   500 mg at 04/07/15 0908  . sildenafil (REVATIO)  tablet 20 mg  20 mg Oral TID [name]   20 mg at 04/07/15 1036  . spironolactone (ALDACTONE) tablet 25 mg  25 mg Oral BID Ripudeep K Rai, [name]   25 mg at 04/07/15 0900  . temazepam (RESTORIL) capsule 30 mg  30 mg Oral QHS Ripudeep K Rai, [name]   30 mg at 04/06/15 2112  . torsemide (DEMADEX) tablet 100 mg  100 mg Oral BID [name]   100 mg at 04/07/15 1036  . vitamin E capsule 400 Units  400 Units Oral Daily [name], [name]   400 Units at 04/07/15 3009    Allergies as of 03/26/2015 - Review Complete 03/26/2015  Allergen Reaction Noted  . Cymbalta [duloxetine hcl] Other (See Comments) 12/04/2013  . Trazodone and nefazodone Other (See Comments) 12/04/2013  . Lyrica [pregabalin] Swelling 12/04/2013    Family History  Problem Relation Age of Onset  . Colon cancer Paternal Uncle   . Cervical cancer Mother   . Hypertension Mother   . Cancer Mother   . Heart disease Father   .  Hypertension Father   . GER disease Father   . Heart attack Father   . Bleeding Disorder Father   . Diabetes Sister   . Heart disease Sister   . Hypertension Sister     History   Social History  . Marital Status: Single    Spouse Name: [name]  . Number of Children: 1  . Years of Education: [name]   Occupational History  . disabled    Social History Main Topics  . Smoking status: Former Smoker -- 0.50 packs/day for 20 years    Types: Cigarettes    Quit date: 11/19/2009  . Smokeless tobacco: Former Neurosurgeon     Comment: Quit x 4 years  . Alcohol Use: No  . Drug Use: No  . Sexual Activity: Yes    Birth Control/ Protection: None   Other Topics Concern  . 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   Single- 1 daughter age 47.    Does not get regular exercise.      Review of Systems: Gen: Denies any fever, chills, sweats, anorexia, fatigue, weakness, malaise, weight loss, and sleep disorder CV; Has CHF, orthopnea.  Resp: Denies cough, sputum, wheezing, coughing up blood, and pleurisy. GI: Denies vomiting blood, jaundice, and fecal incontinence.   Denies dysphagia or odynophagia. GU : Denies urinary burning, blood in urine, urinary frequency, urinary hesitancy, nocturnal urination, and urinary incontinence. MS: Has chronic back pain. pain. Denies muscle weakness, cramps, atrophy.  Derm: Denies rash, itching, dry skin, hives, moles, warts, or unhealing ulcers.  Psych: Denies depression, anxiety, memory loss, suicidal ideation, hallucinations, paranoia, and confusion. Heme: Denies bruising, bleeding, and enlarged lymph nodes. Neuro:  Denies any headaches, dizziness, paresthesias.   Physical Exam: Vital signs in last 24 hours: Temp:  [96.8 F (36 C)-98.2 F (36.8 C)] 98.2 F (36.8 C) (07/27 0802) Pulse Rate:  [74-82] 82 (07/27 0802) Resp:  [16-18] 17 (07/27 0802) BP: (104-140)/(52-78) 108/78 mmHg (07/27 0802) SpO2:  [92 %-96 %] 96 %  (07/27 0802) Weight:  [265 lb (120.203 kg)] 265 lb (120.203 kg) (07/27 0500) Last BM Date: 04/02/15 General:   Alert,  Well-developed, well-nourished, pleasant and cooperative in NAD Head:  Normocephalic and atraumatic. Eyes:  Sclera clear, no icterus. Conjunctiva pink. Ears:  Normal auditory acuity. Nose:  No deformity, discharge,  or lesions. Mouth:  No deformity or lesions.   Neck:  Supple; no masses or thyromegaly. Lungs:  Clear throughout to auscultation.    Heart:  Regular rate and rhythm, 2/6 murmur Abdomen:  Obese, soft, nontender, BS active, nonpalp mass or hsm.   Rectal:  Deferred  Msk:  Symmetrical without gross deformities. . Pulses:  Normal pulses noted. Extremities: 1+ LE edema Neurologic:  Alert and  oriented x4;  grossly normal neurologically. Skin:  Intact without significant lesions or rashes.. Psych:  Alert and cooperative. Normal mood and affect.  Intake/Output from previous day: 07/26 0701 - 07/27 0700 In: 862.3 [P.O.:500; I.V.:362.3] Out: 2450 [Urine:2450] Intake/Output this shift: Total I/O In: 240 [P.O.:240] Out: -   Lab Results:  Recent Labs  04/05/15 0515 04/06/15 0515 04/07/15 0519  WBC 7.1 6.3 5.6  HGB 15.0 15.0 14.4  HCT 45.9 45.3 43.2  PLT 279 280 264   BMET  Recent Labs  04/05/15 1515 04/06/15 0515 04/07/15 0519  NA 128* 129* 127*  K 4.0 4.2 4.3  CL 80* 80* 84*  CO2 35* 38* 34*  GLUCOSE 274* 186* 194*  BUN 42* 43* 44*  CREATININE 1.47* 1.66* 1.39*  CALCIUM 9.0 9.0 8.7*   Hepatitis Panel  Recent Labs  04/06/15 1730  HCVAB <0.1   Procedures: Pt has had: 2010: TCS/EGD/Givens-no source identified, and in 2011: TCS/Push enteroscopy-no source identified.  on 08/11/10. PATH: MILD GASTRITIS, Duodenal Bx: nl, hyperplastic polyps.  [facility] showed scant blood in the duodenum, distal jejunal AVM.  [facility] [place] on 05/28/2012  diminutive duodenal polyp, diminutive polyp in the gastric cardia. No duodenal AVMs noted. Pathology of duodenal polyp was benign duodenal mucosa with focally prominent Brunner's gland. Gastric polyp was a fundic gland polyp.  IMPRESSION/PLAN: 49 year old female admitted after syncopal episode/Vtach/Vfib resuscitated with an ICD shock, with a history of acute on chronic combined systolic and diastolic heart failure evaluated for possible LVAD. Patient has a history of iron deficiency anemia from recurrent bleeding of AVMs. Hemoglobin has been stable this admission. Will check stool for occult blood. Will review with attending as to possible intraluminal evaluation.   Hvozdovic, Moise Boring 04/07/2015,  Pager [phone]     Attending physician's note   I have taken a history, examined the patient and reviewed the chart. I agree with the Advanced Practitioner's note, impression and recommendations. Hospitalized with syncope from VT/VF. Undergoing a evaluation for possible LVAD. She has had multiple endoscopic procedures at [name] and at [facility] over the past several years as summarized above. She has a history of IDA and jejunal AVMs were found on CE treated with deep enteroscopy at [name] in 2012. Current Hb=14.4 and she has not had anemia for 2 years. Colonoscopy is up to date with her last colonoscopy in 2011 and she is due for her next screening colonoscopy in 2021. Last EGD was in 2013. She has no GI symptoms at this time except constipation. Even if she occult blood is found in her stool I do feel it is necessary to repeat any endoscopic studies at this time. Given her significant cardiac problems it would be prudent to avoid the risk of sedation and GI procedures unless they were clearly necessary. GI signing off. Outpatient GI follow with Drs. Fields and Rourk.   Meryl Dare, [name] Clementeen Graham

## 2015-04-07 NOTE — Progress Notes (Signed)
[facility] PROGRESS NOTE Interim History: 49 year old female with morbid obesity, obstructive sleep apnea on CPAP, nonischemic cardiomyopathy, chronic systolic and diastolic CHF, EF 96-04% with grade 3 diastolic dysfunction per echo in March 2016, GERD, diabetes mellitus on insulin presented to ED with syncopal episode and progressive worsening shortness of breath. Syncopal episode due to V-tach catch by AICD. With ADHF on IV milrinoe and diuretics. Has diurese well, now on oral torsemide. Had few episode of VT/VF on 7.23.2016. Loaded with amio.  Filed Weights   04/05/15 0300 04/06/15 0324 04/07/15 0500  Weight: 125.3 kg (276 lb 3.8 oz) 127.3 kg (280 lb 10.3 oz) 120.203 kg (265 lb)        Intake/Output Summary (Last 24 hours) at 04/07/15 0720 Last data filed at 04/07/15 0600  Gross per 24 hour  Intake  862.3 ml  Output   2450 ml  Net -1587.7 ml     Assessment/Plan: Syncope/ VT/VF in the setting of hypokalemia on admisison: She was resuscitated with an ICD shock, recurrent VF/VT on 7.23.2016 and shock. Appreciate cards assistance. She was cont on a higher dose of amiodarone.  Acute on chronic combined systolic and diastolic heart failure/Cardiogenic shock/nonischemic cardiomyopathy: Cont on IV lasix. With improved UOP. Kidney function is improving. CVP cont to be high, further management per cards. [facility] team recommended Sindenafil. RHC performed on 7.21.2016,now on milrinone. PICC line place on 7.22.2016. CT surgery evaluated for LVAD. Will consult GI. ? Can cardiology take over care of Mrs. Lalla Cont lytes replacement.    Insulin-dependent diabetes mellitus: With several episodes of hypoglycemia on admission, ? Due to worsening renal function. Her A1c was 7.5. BG improving slowly, cont titrate long acting insulin as tolerate it.  GERD: Continue Protonix.  Essential hypertension: Continue Coreg, losartan, metolazone and Lasix.  Her blood pressure is  close to goal.  Atypical chest pain: CT imaging of the chest negative for PE, ? Due to ADHF.  Morbid obesity: Counseling.  Code Status: full Family Communication: none  Disposition Plan: home in 3-5 days   Consultants:  cardiology  Procedures: ECHO: none   Antibiotics:  None  HPI/Subjective: SHe has no new complains in a joyfull mood.  Objective: Filed Vitals:   04/06/15 1900 04/06/15 2318 04/07/15 0400 04/07/15 0500  BP: 140/78 104/66 110/66   Pulse:   78   Temp: 97.5 F (36.4 C) 96.8 F (36 C) 97.6 F (36.4 C)   TempSrc: Oral Axillary Axillary   Resp: 18  16   Height:      Weight:    120.203 kg (265 lb)  SpO2: 95% 93% 95%      Exam:  General: Alert, awake, oriented x3, in no acute distress.  HEENT: No bruits, no goiter.  Heart: Regular rate and rhythm.  Lungs: Good air movement, clear Neuro: Grossly intact, nonfocal.   Data Reviewed: Basic Metabolic Panel:  Recent Labs Lab 03/31/15 1114  04/02/15 0510 04/03/15 0523 04/04/15 0431 04/05/15 0515 04/05/15 1515 04/06/15 0515 04/07/15 0519  NA  --   < > 135 131* 131* 129* 128* 129* 127*  K  --   < > 4.5 3.9 4.6 4.4 4.0 4.2 4.3  CL  --   < > 84* 80* 82* 81* 80* 80* 84*  CO2  --   < > 42* 40* 40* 38* 35* 38* 34*  GLUCOSE  --   < > 135* 232* 166* 249* 274* 186* 194*  BUN  --   < > 32*  34* 37* 40* 42* 43* 44*  CREATININE  --   < > 1.51* 1.23* 1.33* 1.42* 1.47* 1.66* 1.39*  CALCIUM  --   < > 9.0 8.9 8.9 8.9 9.0 9.0 8.7*  MG 2.2  --  2.0 2.1  --  2.1  --   --  2.3  < > = values in this interval not displayed. Liver Function Tests: No results for input(s): AST, ALT, ALKPHOS, BILITOT, PROT, ALBUMIN in the last 168 hours. No results for input(s): LIPASE, AMYLASE in the last 168 hours. No results for input(s): AMMONIA in the last 168 hours. CBC:  Recent Labs Lab 03/31/15 1207 04/05/15 0515 04/06/15 0515 04/07/15 0519  WBC 5.2 7.1 6.3 5.6  HGB 15.0 15.0 15.0 14.4  HCT 46.0 45.9 45.3 43.2    MCV 93.7 94.1 93.6 92.7  PLT 239 279 280 264   Cardiac Enzymes: No results for input(s): CKTOTAL, CKMB, CKMBINDEX, TROPONINI in the last 168 hours. BNP (last 3 results)  Recent Labs  01/20/15 0040 01/26/15 1100 03/26/15 1633  BNP 166.0* 223.8* 549.5*    ProBNP (last 3 results)  Recent Labs  08/04/14 2339 08/21/14 1117  PROBNP 1727.0* 171.9*    CBG:  Recent Labs Lab 04/05/15 2209 04/06/15 0811 04/06/15 1227 04/06/15 1746 04/06/15 2151  GLUCAP 186* 172* 173* 118* 155*    Recent Results (from the past 240 hour(s))  MRSA PCR Screening     Status: None   Collection Time: 04/02/15 11:45 AM  Result Value Ref Range Status   MRSA by PCR NEGATIVE NEGATIVE Final    Comment:        The GeneXpert MRSA Assay (FDA approved for NASAL specimens only), is one component of a comprehensive MRSA colonization surveillance program. It is not intended to diagnose MRSA infection nor to guide or monitor treatment for MRSA infections.      Studies: No results found.  Scheduled Meds: . allopurinol  300 mg Oral Daily  . amiodarone  200 mg Oral BID  . antiseptic oral rinse  7 mL Mouth Rinse BID  . aspirin EC  81 mg Oral Daily  . digoxin  0.0625 mg Oral Daily  . docusate sodium  200 mg Oral BID  . DULoxetine  30 mg Oral Daily  . ferrous sulfate  325 mg Oral TID  . furosemide  80 mg Intravenous BID  . gabapentin  600 mg Oral QID  . heparin  5,000 Units Subcutaneous 3 times per day  . insulin aspart  0-5 Units Subcutaneous QHS  . insulin aspart  0-9 Units Subcutaneous TID WC  . insulin detemir  56 Units Subcutaneous BID  . isosorbide-hydrALAZINE  0.5 tablet Oral TID  . ivabradine  2.5 mg Oral BID WC  . Linaclotide  290 mcg Oral Daily  . magnesium oxide  400 mg Oral Daily  . neomycin-bacitracin-polymyxin   Topical BID  . pantoprazole  40 mg Oral Daily  . potassium chloride  40 mEq Oral TID  . ranolazine  500 mg Oral BID  . spironolactone  25 mg Oral BID  . temazepam   30 mg Oral QHS  . vitamin E  400 Units Oral Daily   Continuous Infusions: . milrinone 0.375 mcg/kg/min (04/07/15 0443)     Marinda Elk  [facility] Pager [phone] If 7PM-7AM, please contact night-coverage at [email or web address], password [facility] 04/07/2015, 7:20 AM  LOS: 11 days

## 2015-04-07 NOTE — Progress Notes (Signed)
PT Cancellation Note  Patient Details Name: CARAH ARNWINE MRN: [id number] DOB: 1966-01-22   Cancelled Treatment:    Reason Eval/Treat Not Completed: Patient at procedure or test/unavailable. Checked back on pt and radiology performing procedure in room. Will follow tomorrow.     Zea Kostka, [place] 04/07/2015, 3:16 PM

## 2015-04-07 NOTE — Progress Notes (Signed)
*  PRELIMINARY RESULTS* Vascular Ultrasound Lower extremity venous duplex has been completed.  Preliminary findings: negative for DVT.  Farrel Demark, RDMS, RVT  04/07/2015, 10:36 AM

## 2015-04-07 NOTE — Progress Notes (Signed)
Initial Nutrition Assessment  DOCUMENTATION CODES:   Morbid obesity  INTERVENTION:   -Reviewed DM and Heart Healthy diet guidelines with pt. RD provided "Heart Healthy Nutrition therapy" and "Type 2 Diabetes Nutrition Therapy" handouts from [facility] Nutrition Care Manual -Pt may benefit from outpatient nutrition referral to [facility] after discharge for continues support for weight loss and diabetes management  NUTRITION DIAGNOSIS:   Food and nutrition related knowledge deficit related to other (see comment) (uncontrolled DM) as evidenced by  (Hgb A1c: 7.5).  GOAL:   Patient will meet greater than or equal to 90% of their needs  MONITOR:   PO intake, Labs, Weight trends, Skin, I & O's  REASON FOR ASSESSMENT:   Consult  (LVAD evaluation)  ASSESSMENT:   49 year old female with morbid obesity, obstructive sleep apnea on CPAP, nonischemic cardiomyopathy, chronic systolic and diastolic CHF, EF 57-84% with grade 3 diastolic dysfunction per echo in March 2016, GERD, diabetes mellitus on insulin presented to ED with syncopal episode and progressive worsening shortness of breath. Syncopal episode due to V-tach catch by AICD. With ADHF on IV milrinoe and diuretics. Has diurese well, now on oral torsemide. Had few episode of VT/VF on 7.23.2016. Loaded with amio.  Pt admitted with acute on chronic systolic CHF. Pt is being considered for LVAD and undergoing work-up. Per MD, pt may potentially undergo open heart operation.  Pt reports good appetite currently and PTA. Meal completion 50-100%, per doc flowsheet records. Pt reveals that she consumed 50% of her breakfast, due to being tired of menu selections.   She reports UBW of around 265#. She denies any weight loss.   Pt expressed desire to lose weight. She shares with this RD that she has made alterations to her diet, including grilling proteins instead of frying and eating more salads. Noted  latest Hgb A1c: 7.5. Pt reports that Hgb A1c has historically ben in the 10 range, but has been working hard to decrease this by lifestyle modifications. Additionally, she walks around the mall 1-2 times per week for exercise.   She reports that she was using Glucerna shakes PTA to help her lose weight and asks this RD if she should continue them. This RD explained that nutritional supplements are usually utilized to assist individuals help meet nutritional needs due to poor appetite and wound healing. Discussed importance of good PO intake to promote healing. Reviewed principles of DM and Heart Healthy diet with pt and encouraged continued lifestyle changes to assist with weight loss and optimal control of DM and heart disease. Pt requested additional educational handouts to review, which this RD provided.   Nutrition-Focused physical exam completed. Findings are no fat depletion, no muscle depletion, and mild edema.   Labs reviewed: Na: 127 (on IV replacement).   Diet Order:  Diet heart healthy/carb modified Room service appropriate?: Yes; Fluid consistency:: Thin; Fluid restriction:: 1800 mL Fluid  Skin:  Reviewed, no issues (lt face abrasion)  Last BM:  04/02/15  Height:   Ht Readings from Last 1 Encounters:  03/26/15  (1.651 m)    Weight:   Wt Readings from Last 1 Encounters:  04/07/15 265 lb (120.203 kg)    Ideal Body Weight:  56.8 kg  Wt Readings from Last 10 Encounters:  04/07/15 265 lb (120.203 kg)  03/26/15 280 lb 6 oz (127.177 kg)  03/12/15 275 lb (124.739 kg)  03/01/15 265 lb (120.203 kg)  02/12/15 273 lb 3.2 oz (123.923 kg)  02/02/15 268  lb (121.564 kg)  01/29/15 269 lb 4 oz (122.131 kg)  01/26/15 273 lb 2 oz (123.889 kg)  01/19/15 271 lb (122.925 kg)  01/18/15 272 lb (123.378 kg)    BMI:  Body mass index is 44.1 kg/(m^2).  Estimated Nutritional Needs:   Kcal:  1700-1900  Protein:  100-115 grams  Fluid:  1.7-1.9 L  EDUCATION NEEDS:   Education  needs addressed  Grigor Lipschutz A. Mayford Knife, RD, LDN, CDE Pager: [phone] After hours Pager: [phone]

## 2015-04-07 NOTE — Progress Notes (Signed)
PT Cancellation Note  Patient Details Name: [name] MRN: [id number] DOB: [birth year]   Cancelled Treatment:    Reason Eval/Treat Not Completed: Patient declined, no reason specified. Correction: declined due to visitors in room. Will check back later as time allows.   Teandre Hamre, [place] 04/07/2015, 1:53 PM

## 2015-04-07 NOTE — Progress Notes (Signed)
VASCULAR LAB PRELIMINARY  PRELIMINARY  PRELIMINARY  PRELIMINARY  Pre-op Cardiac Surgery - VAD    Upper Extremity Right Left  Brachial Pressures Restricted limb- Triphasic 108 Triphasic   Findings:      Lower  Extremity Right Left  Dorsalis Pedis 154 Triphasic 106 Triphasic  Posterior Tibial 113 Triphasic 109 Triphasic  Ankle/Brachial Indices 1.54 1.01    Findings:  ABIs and Doppler waveforms indicate normal arterial flow at rest   Warwick Nick, RVS 04/07/2015, 10:16 AM

## 2015-04-07 NOTE — Progress Notes (Signed)
Palliative care support consultation continued today with [name].  She is sitting up in the chair, looks well today, in good spirits.  Her daughter spent the day with her here at the hospital, she attributes spending time with her daughter to her sense of well-being today.  Prayer and spiritual insight are also an important part of her emotional self care.    We continued to discuss ACP and I provided her with a copy of the paperwork.  We discussed that she will look over the paperwork this evening, make some notes and list any questions and we will complete together and have notarized in the next day or so.    [name] discussed that she wants to proceed with a family meeting to include her father and stepmother, who will be coming from [facility] on Aug 5th, also to be included, her daughter, an uncle and perhaps her sister from [place].    Of note she was seen today by Dr. Donata Clay and there is the possibility of and LVAD work up for eligibility.    Palliative Care will continue to support [name] with information and decision making during this hospitalization.  Cindra Presume, RN-BC, MSN, [facility] Palliative [facility]

## 2015-04-07 NOTE — Progress Notes (Signed)
Patient ID: [name], female   DOB: 24-Feb-1966, 49 y.o.   MRN: [id number]  Advanced Heart Failure Rounding Note  PCP: Lorenza Evangelist Primary Cardiologist: [name]  Subjective:    Admitted with acute on chronic systolic CHF.   RHC 03/31/15 with low output, milrinone started.  Not moving much, denies dyspnea at rest.   Milrinone increased to 0.375 on 7/22. 7/23 had recurrent VT/VF and was shocked by ICD.  High burden of PVCs, seemed to decrease with addition of ranolazine.  Co-ox 66%=>57% today.  CVP 11 today with good diuresis yesterday.  Walked with cardiac rehab yesterday, doing better.   RHC 03/31/15 RA = 28 RV = 83/19/30 PA = 86/38 (65) PCW = 36 Fick cardiac output/index = 2.9/1.3 Thermo cardiac output/index = 3.1/1.3 PVR = 10 Arterial sat = 99% PA sat = 45%, 48%   Objective:   Weight Range: 265 lb (120.203 kg) Body mass index is 44.1 kg/(m^2).   Vital Signs:   Temp:  [96.8 F (36 C)-97.6 F (36.4 C)] 97.6 F (36.4 C) (07/27 0400) Pulse Rate:  [74-78] 78 (07/27 0400) Resp:  [16-18] 16 (07/27 0400) BP: (103-140)/(52-78) 110/66 mmHg (07/27 0400) SpO2:  [92 %-97 %] 95 % (07/27 0400) Weight:  [265 lb (120.203 kg)] 265 lb (120.203 kg) (07/27 0500) Last BM Date: 04/02/15  Weight change: Filed Weights   04/05/15 0300 04/06/15 0324 04/07/15 0500  Weight: 276 lb 3.8 oz (125.3 kg) 280 lb 10.3 oz (127.3 kg) 265 lb (120.203 kg)    Intake/Output:   Intake/Output Summary (Last 24 hours) at 04/07/15 0802 Last data filed at 04/07/15 0600  Gross per 24 hour  Intake  831.3 ml  Output   2450 ml  Net -1618.7 ml     Physical Exam: General: Obese. No resp difficulty. NAD Neck: supple. JVP difficult to assess 2/2 girth but suspect 8-9 cm. Carotids 2+ bilat; no bruits. No lymphadenopathy or thryomegaly noted. Cor: PMI nondisplaced. Regular rate & rhythm. No rubs. 2/6 HSM LLSB, no S3 Lungs: Diminished bilaterally.clear Abdomen: marked central obesity, nontender, mild  distention. No hepatosplenomegaly. No bruits or masses. Good bowel sounds. Extremities: no cyanosis, clubbing, rash. Trace ankle edema.  Neuro: alert & orientedx3, cranial nerves grossly intact. moves all 4 extremities w/o difficulty. Affect flat  Telemetry: A sensed V paced in 80s. Less frequent PVCs.    Labs: CBC  Recent Labs  04/06/15 0515 04/07/15 0519  WBC 6.3 5.6  HGB 15.0 14.4  HCT 45.3 43.2  MCV 93.6 92.7  PLT 280 264   Basic Metabolic Panel  Recent Labs  04/05/15 0515  04/06/15 0515 04/07/15 0519  NA 129*  < > 129* 127*  K 4.4  < > 4.2 4.3  CL 81*  < > 80* 84*  CO2 38*  < > 38* 34*  GLUCOSE 249*  < > 186* 194*  BUN 40*  < > 43* 44*  CALCIUM 8.9  < > 9.0 8.7*  MG 2.1  --   --  2.3  < > = values in this interval not displayed. Liver Function Tests No results for input(s): AST, ALT, ALKPHOS, BILITOT, PROT, ALBUMIN in the last 72 hours. No results for input(s): LIPASE, AMYLASE in the last 72 hours. Cardiac Enzymes No results for input(s): CKTOTAL, CKMB, CKMBINDEX, TROPONINI in the last 72 hours.  BNP: BNP (last 3 results)  Recent Labs  01/20/15 0040 01/26/15 1100 03/26/15 1633  BNP 166.0* 223.8* 549.5*    ProBNP (last 3  results)  Recent Labs  08/04/14 2339 08/21/14 1117  PROBNP 1727.0* 171.9*     D-Dimer No results for input(s): DDIMER in the last 72 hours. Hemoglobin A1C No results for input(s): HGBA1C in the last 72 hours. Fasting Lipid Panel  Recent Labs  04/07/15 0519  CHOL 238*  HDL 65  LDLCALC 141*  TRIG 162*  CHOLHDL 3.7   Thyroid Function Tests  Recent Labs  04/07/15 0519  TSH 4.432    Other results:     Imaging/Studies:  No results found.  Latest Echo  Latest Cath   Medications:     Scheduled Medications: . allopurinol  300 mg Oral Daily  . amiodarone  200 mg Oral BID  . antiseptic oral rinse  7 mL Mouth Rinse BID  . aspirin EC  81 mg Oral Daily  . digoxin  0.0625 mg Oral Daily  . docusate sodium   200 mg Oral BID  . DULoxetine  30 mg Oral Daily  . ferrous sulfate  325 mg Oral TID  . gabapentin  600 mg Oral QID  . heparin  5,000 Units Subcutaneous 3 times per day  . insulin aspart  0-5 Units Subcutaneous QHS  . insulin aspart  0-9 Units Subcutaneous TID WC  . insulin detemir  58 Units Subcutaneous BID  . isosorbide-hydrALAZINE  0.5 tablet Oral TID  . ivabradine  2.5 mg Oral BID WC  . Linaclotide  290 mcg Oral Daily  . magnesium oxide  400 mg Oral Daily  . neomycin-bacitracin-polymyxin   Topical BID  . pantoprazole  40 mg Oral Daily  . potassium chloride  40 mEq Oral TID  . ranolazine  500 mg Oral BID  . spironolactone  25 mg Oral BID  . temazepam  30 mg Oral QHS  . torsemide  100 mg Oral BID  . vitamin E  400 Units Oral Daily    Infusions: . milrinone 0.375 mcg/kg/min (04/07/15 0443)    PRN Medications: acetaminophen, albuterol, cyclobenzaprine, ondansetron (ZOFRAN) IV, oxyCODONE-acetaminophen   Assessment/Plan   1. Acute on chronic systolic CHF: Nonischemic cardiomyopathy.  EF 30-35% in the past, difficult to assess on echo this admission due to poor windows. Nonischemic cardiomyopathy.  Volume status improved, co-ox stable at 57% and CVP 11. Good diuresis yesterday.   - Do not want to increase milrinone with ectopy/recent VT, continue current dose 0.375.  It appears that she will need to go home on milrinone.   - Transition to home torsemide 100 mg bid (may need less on milrinone, will see). - Continue hydralazine 25 mg tid with no Imdur as she will be on sildenafil.  - Continue spironolactone, low dose digoxin. Will need digoxin level, send tomorrow.  - Beta blocker held in setting of cardiogenic shock - Poor candidate for transplant given obesity.  Will evaluate for LVAD => long-term milrinone is poor option due to ventricular ectopy/history of VT and ICD shocks. Dr Donata Clay has seen.  Will need repeat limited echo for EF using contrast (echo this admission was poor  quality, historically EF has been somewhat out of LVAD range at 30-35%).  Given history of GI bleeding from AVMs, will also need GI evaluation and probably EGD prior to LVAD.  2. Atypical CP: Trop negative. Normal coronaries on cath 4/15 3. VT/VF with ICD resuscitation:  Now on amiodarone + ranolazine, ectopy appears to have calmed. Now on po amiodarone.  4. CKD Stage 3: Follow creatinine closely, stable at 1.39. 5. Pulmonary hypertension: Mixed pulmonary  venous/pulmonary arterial HTN.  Will start Revatio 20 mg tid.   6. GERD 7. DM2 8. OSA with CPAP 9. Morbid obesity 10. Hyponatremia: Fluid restrict.   35 minutes critical care time.   Length of Stay: 11 [name] MD  04/07/2015, 8:02 AM Advanced Heart Failure Team Pager [phone] (M-F; 7a - 4p)  Please contact [facility] for night-coverage after hours (4p -7a ) and weekends on [email or web address]

## 2015-04-07 NOTE — Care Management Note (Signed)
Case Management Note  Patient Details  Name: FARDOSA STAUTER MRN: [id number] Date of Birth: 10/28/1965  Subjective/Objective:     Adm w heart failure               Action/Plan: lives at home w fam.  Expected Discharge Date:  03/29/15               Expected Discharge Plan:  Home w Home Health Services  In-House Referral:  NA  Discharge planning Services  CM Consult  Post Acute Care Choice:  Home Health, Durable Medical Equipment Choice offered to:  Patient  DME Arranged:  IV pump/equipment DME Agency:  [facility]  HH Arranged:  RN, Disease Management HH Agency:  [facility], [facility]  Status of Service:  Completed, signed off  Medicare Important Message Given:  Yes-fourth notification given Date Medicare IM Given:    Medicare IM give by:    Date Additional Medicare IM Given:    Additional Medicare Important Message give by:     If discussed at Long Length of Stay Meetings, dates discussed:    Additional Comments: poss dc on 7/28. Have alerted donna w [facility] that maybe dc on 7/28. [facility] preparing for home milrinone. Pt has medicaid for meds and her copay will be low for revatio.  Hanley Hays, RN 04/07/2015, 9:58 AM

## 2015-04-07 NOTE — Progress Notes (Signed)
  Echocardiogram 2D Echocardiogram has been performed.  [name] 04/07/2015, 12:17 PM

## 2015-04-07 NOTE — Progress Notes (Signed)
Pre-op Cardiac Surgery- VAD  Carotid Findings:  Bilateral:  1-39% ICA stenosis.  Right vertebral not visualized. Left vertebral patent with antegrade flow.      Farrel Demark, RDMS, RVT 04/07/2015

## 2015-04-08 ENCOUNTER — Encounter (HOSPITAL_COMMUNITY): Payer: Self-pay | Admitting: Infectious Diseases

## 2015-04-08 DIAGNOSIS — R791 Abnormal coagulation profile: Secondary | ICD-10-CM

## 2015-04-08 LAB — CARBOXYHEMOGLOBIN
CARBOXYHEMOGLOBIN: 1.7 % — AB (ref 0.5–1.5)
Methemoglobin: 1.4 % (ref 0.0–1.5)
O2 Saturation: 62.2 %
Total hemoglobin: 14.9 g/dL (ref 12.0–16.0)

## 2015-04-08 LAB — CBC
HEMATOCRIT: 43.8 % (ref 36.0–46.0)
Hemoglobin: 14.5 g/dL (ref 12.0–15.0)
MCH: 30.8 pg (ref 26.0–34.0)
MCHC: 33.1 g/dL (ref 30.0–36.0)
MCV: 93 fL (ref 78.0–100.0)
PLATELETS: 259 10*3/uL (ref 150–400)
RBC: 4.71 MIL/uL (ref 3.87–5.11)
RDW: 15.5 % (ref 11.5–15.5)
WBC: 5.8 10*3/uL (ref 4.0–10.5)

## 2015-04-08 LAB — COMPREHENSIVE METABOLIC PANEL
ALBUMIN: 3.5 g/dL (ref 3.5–5.0)
ALT: 29 U/L (ref 14–54)
AST: 52 U/L — ABNORMAL HIGH (ref 15–41)
Alkaline Phosphatase: 103 U/L (ref 38–126)
Anion gap: 10 (ref 5–15)
BUN: 46 mg/dL — ABNORMAL HIGH (ref 6–20)
CO2: 31 mmol/L (ref 22–32)
CREATININE: 1.56 mg/dL — AB (ref 0.44–1.00)
Calcium: 8.9 mg/dL (ref 8.9–10.3)
Chloride: 91 mmol/L — ABNORMAL LOW (ref 101–111)
GFR, EST AFRICAN AMERICAN: 44 mL/min — AB (ref >60)
GFR, EST NON AFRICAN AMERICAN: 38 mL/min — AB (ref >60)
GLUCOSE: 139 mg/dL — AB (ref 65–99)
Potassium: 4.2 mmol/L (ref 3.5–5.1)
Sodium: 132 mmol/L — ABNORMAL LOW (ref 135–145)
Total Bilirubin: 1.1 mg/dL (ref 0.3–1.2)
Total Protein: 8.5 g/dL — ABNORMAL HIGH (ref 6.5–8.1)

## 2015-04-08 LAB — GLUCOSE, CAPILLARY
GLUCOSE-CAPILLARY: 161 mg/dL — AB (ref 65–99)
Glucose-Capillary: 175 mg/dL — ABNORMAL HIGH (ref 65–99)

## 2015-04-08 LAB — LUPUS ANTICOAGULANT PANEL
DRVVT: 40.9 s (ref 0.0–55.1)
PTT Lupus Anticoagulant: 43.5 s (ref 0.0–50.0)

## 2015-04-08 LAB — HEMOGLOBIN A1C
Hgb A1c MFr Bld: 7.2 % — ABNORMAL HIGH (ref 4.8–5.6)
Mean Plasma Glucose: 160 mg/dL

## 2015-04-08 LAB — DIGOXIN LEVEL: Digoxin Level: 0.2 ng/mL — ABNORMAL LOW (ref 0.8–2.0)

## 2015-04-08 MED ORDER — SILDENAFIL CITRATE 20 MG PO TABS: 20.0000 mg | ORAL_TABLET | Freq: Three times a day (TID) | ORAL | Status: DC

## 2015-04-08 MED ORDER — MILRINONE IN DEXTROSE 20 MG/100ML IV SOLN: 0.3750 ug/kg/min | INTRAVENOUS | Status: DC

## 2015-04-08 MED ORDER — RANOLAZINE ER 500 MG PO TB12: 500.0000 mg | ORAL_TABLET | Freq: Two times a day (BID) | ORAL | Status: AC

## 2015-04-08 MED ORDER — AMIODARONE HCL 200 MG PO TABS: 200.0000 mg | ORAL_TABLET | Freq: Two times a day (BID) | ORAL | Status: DC

## 2015-04-08 MED ORDER — METOLAZONE 2.5 MG PO TABS: 2.5000 mg | ORAL_TABLET | ORAL | Status: DC | PRN

## 2015-04-08 MED ORDER — INSULIN DETEMIR 100 UNIT/ML ~~LOC~~ SOLN: 58.0000 [IU] | Freq: Two times a day (BID) | SUBCUTANEOUS | Status: AC

## 2015-04-08 MED ORDER — HYDRALAZINE HCL 25 MG PO TABS: 25.0000 mg | ORAL_TABLET | Freq: Three times a day (TID) | ORAL | Status: DC

## 2015-04-08 MED ORDER — POTASSIUM CHLORIDE CRYS ER 20 MEQ PO TBCR: 40.0000 meq | EXTENDED_RELEASE_TABLET | Freq: Three times a day (TID) | ORAL | Status: DC

## 2015-04-08 NOTE — Progress Notes (Signed)
Alerted pam and donna w [facility] of dc planned for today. [facility] will provide home milrinone.

## 2015-04-08 NOTE — Progress Notes (Signed)
[facility] in and already set up milrinone for home for pt. Pt ready for discharge home, d/c instructions reviewed with pt and her fiance. Copy of instructions given to pt. All scripts had been sent in electronically to pt's pharmacy by MD. Reviewed CHF education with pt again, pt able to teach back and knows when to call CHF MD. Pt d/c'd via wheelchair with belongings and needed equipment/materials [facility] gave to pt today to take home. Pt escorted by unit NT.

## 2015-04-08 NOTE — Progress Notes (Signed)
LVAD Coordinator Note: Unable to complete PFTs during inpatient stay. Arrangements made for her to have them done outpatient on Tuesday August 9th at 0900.   She will need to arrive at 0845 to check in with admissions. No caffeine, smoking or breathing medicines 4 hours prior to study.   Rexene Alberts, RN VAD Coordinator   Office: [phone] 24/7 VAD Pager: [phone]

## 2015-04-08 NOTE — Progress Notes (Signed)
Physical Therapy Treatment Patient Details Name: [name] MRN: [id number] DOB: 1966/06/30 Today's Date: 04/08/2015    History of Present Illness 49 year old female with morbid obesity, obstructive sleep apnea on CPAP, nonischemic cardiomyopathy, chronic systolic and diastolic CHF, EF 40-98% with grade 3 diastolic dysfunction per echo in March 2016, GERD, diabetes mellitus on insulin presented to ED with syncopal episode and progressive worsening shortness of breath. Syncopal episode due to V-tach catch by AICD. With ADHF on IV milrinoe and diuretics    PT Comments    Pt with great increase in ambulation distance and balance today. Pt without need for support with gait but got OOB to [facility] without assist without socks on and almost pulled PICC line out. Pt educated for safety and need for assist with mobility for lines and safety. Encouraged walking program and HEP to prepare for hopeful LVAD. Will continue to follow acutely  Follow Up Recommendations  No PT follow up     Equipment Recommendations       Recommendations for Other Services       Precautions / Restrictions Precautions Precautions: Fall Restrictions Weight Bearing Restrictions: No    Mobility  Bed Mobility               General bed mobility comments: on BSC on arrival with lines pulling  Transfers Overall transfer level: Needs assistance     Sit to Stand: Supervision         General transfer comment: cues for hand placement, pt able to control descent to bed and chair very well today.   Ambulation/Gait Ambulation/Gait assistance: Supervision Ambulation Distance (Feet): 650 Feet Assistive device: None Gait Pattern/deviations: Step-through pattern;Decreased stride length   Gait velocity interpretation: at or above normal speed for age/gender General Gait Details: pt denied use of RW, no need for supoort of IV pole with generally steady gait with 2 partial LOB with pt able to recover without  physical assist   Stairs            Wheelchair Mobility    Modified Rankin (Stroke Patients Only)       Balance     Sitting balance-[name]: Good       Standing balance-[name]: Good                      Cognition Arousal/Alertness: Awake/alert Behavior During Therapy: WFL for tasks assessed/performed Overall Cognitive Status: Within Functional Limits for tasks assessed                      Exercises General Exercises - Lower Extremity Long Arc Quad: AROM;Seated;Both;20 reps Hip Flexion/Marching: AROM;Seated;Both;20 reps    General Comments        Pertinent Vitals/Pain Pain Assessment: No/denies pain  HR 82-93    Home Living                      Prior Function            PT Goals (current goals can now be found in the care plan section) Progress towards PT goals: Progressing toward goals    Frequency       PT Plan Current plan remains appropriate    Co-evaluation             End of Session Equipment Utilized During Treatment: Gait belt Activity Tolerance: Patient tolerated treatment well Patient left: in chair;with call bell/phone within reach;with chair alarm set  Time: 5284-1324 PT Time Calculation (min) (ACUTE ONLY): 28 min  Charges:  $Gait Training: 8-22 mins $Therapeutic Exercise: 8-22 mins                    G Codes:      Delorse Lek 04/16/15, 10:34 AM Delaney Meigs, PT [phone]

## 2015-04-08 NOTE — Progress Notes (Signed)
Patient ID: [name], female   DOB: [birth year], 49 y.o.   MRN: [id number]  Advanced Heart Failure Rounding Note  PCP: Lorenza Evangelist Primary Cardiologist: [name]  Subjective:    Admitted with acute on chronic systolic CHF.   RHC 03/31/15 with low output, milrinone started.  Not moving much, denies dyspnea at rest.   Milrinone increased to 0.375 on 7/22. 7/23 had recurrent VT/VF and was shocked by ICD.  High burden of PVCs, seemed to decrease with addition of ranolazine.  Co-ox 62% today.  CVP 10 today, now on po torsemide.  Weight down 2 lbs.   Limited echo 7/28: Very difficult windows, severe LV dilation with EF 30%.  RHC 03/31/15 RA = 28 RV = 83/19/30 PA = 86/38 (65) PCW = 36 Fick cardiac output/index = 2.9/1.3 Thermo cardiac output/index = 3.1/1.3 PVR = 10 Arterial sat = 99% PA sat = 45%, 48%   Objective:   Weight Range: 263 lb 14.3 oz (119.7 kg) Body mass index is 43.91 kg/(m^2).   Vital Signs:   Temp:  [98 F (36.7 C)-98.2 F (36.8 C)] 98 F (36.7 C) (07/28 0400) Pulse Rate:  [77-84] 80 (07/28 0400) Resp:  [17-18] 18 (07/28 0400) BP: (99-150)/(75-100) 126/78 mmHg (07/28 0400) SpO2:  [95 %-98 %] 95 % (07/28 0400) Weight:  [263 lb 14.3 oz (119.7 kg)] 263 lb 14.3 oz (119.7 kg) (07/28 0500) Last BM Date: 04/02/15  Weight change: Filed Weights   04/06/15 0324 04/07/15 0500 04/08/15 0500  Weight: 280 lb 10.3 oz (127.3 kg) 265 lb (120.203 kg) 263 lb 14.3 oz (119.7 kg)    Intake/Output:   Intake/Output Summary (Last 24 hours) at 04/08/15 0727 Last data filed at 04/07/15 1500  Gross per 24 hour  Intake    240 ml  Output    800 ml  Net   -560 ml     Physical Exam: General: Obese. No resp difficulty. NAD Neck: supple. JVP difficult to assess 2/2 girth but suspect 8-9 cm. Carotids 2+ bilat; no bruits. No lymphadenopathy or thryomegaly noted. Cor: PMI nondisplaced. Regular rate & rhythm. No rubs. 2/6 HSM LLSB, no S3 Lungs: Diminished  bilaterally.clear Abdomen: marked central obesity, nontender, mild distention. No hepatosplenomegaly. No bruits or masses. Good bowel sounds. Extremities: no cyanosis, clubbing, rash. Trace ankle edema.  Neuro: alert & orientedx3, cranial nerves grossly intact. moves all 4 extremities w/o difficulty. Affect flat  Telemetry: A sensed V paced in 80s. Occasional PVCs.    Labs: CBC  Recent Labs  04/07/15 0519 04/08/15 0602  WBC 5.6 5.8  HGB 14.4 14.5  HCT 43.2 43.8  MCV 92.7 93.0  PLT 264 259   Basic Metabolic Panel  Recent Labs  04/07/15 0519 04/08/15 0602  NA 127* 132*  K 4.3 4.2  CL 84* 91*  CO2 34* 31  GLUCOSE 194* 139*  BUN 44* 46*  CALCIUM 8.7* 8.9  MG 2.3  --    Liver Function Tests  Recent Labs  04/08/15 0602  AST 52*  ALT 29  ALKPHOS 103  BILITOT 1.1  PROT 8.5*  ALBUMIN 3.5   No results for input(s): LIPASE, AMYLASE in the last 72 hours. Cardiac Enzymes No results for input(s): CKTOTAL, CKMB, CKMBINDEX, TROPONINI in the last 72 hours.  BNP: BNP (last 3 results)  Recent Labs  01/20/15 0040 01/26/15 1100 03/26/15 1633  BNP 166.0* 223.8* 549.5*    ProBNP (last 3 results)  Recent Labs  08/04/14 2339 08/21/14 1117  PROBNP 1727.0*  171.9*     D-Dimer No results for input(s): DDIMER in the last 72 hours. Hemoglobin A1C No results for input(s): HGBA1C in the last 72 hours. Fasting Lipid Panel  Recent Labs  04/07/15 0519  CHOL 238*  HDL 65  LDLCALC 141*  TRIG 162*  CHOLHDL 3.7   Thyroid Function Tests  Recent Labs  04/07/15 0519  TSH 4.432    Other results:     Imaging/Studies:  Dg Orthopantogram  04/07/2015   CLINICAL DATA:  Preop LVAD surgery  EXAM: ORTHOPANTOGRAM/PANORAMIC  COMPARISON:  None.  FINDINGS: Multiple missing teeth and restorations. There is lucency around the tooth #4 possibly early periapical abscess. There is lucency overlying the root of tooth #24, possibly artifactual versus abscess.  IMPRESSION: 1.  Periapical lucencies involving teeth numbers 4 and 24. Consider dental films for further evaluation.   Electronically Signed   By: Corlis Leak M.D.   On: 04/07/2015 08:54    Latest Echo  Latest Cath   Medications:     Scheduled Medications: . allopurinol  300 mg Oral Daily  . amiodarone  200 mg Oral BID  . antiseptic oral rinse  7 mL Mouth Rinse BID  . aspirin EC  81 mg Oral Daily  . digoxin  0.0625 mg Oral Daily  . docusate sodium  200 mg Oral BID  . DULoxetine  30 mg Oral Daily  . ferrous sulfate  325 mg Oral TID  . gabapentin  600 mg Oral QID  . heparin  5,000 Units Subcutaneous 3 times per day  . hydrALAZINE  25 mg Oral 3 times per day  . insulin aspart  0-5 Units Subcutaneous QHS  . insulin aspart  0-9 Units Subcutaneous TID WC  . insulin detemir  58 Units Subcutaneous BID  . ivabradine  2.5 mg Oral BID WC  . Linaclotide  290 mcg Oral Daily  . magnesium oxide  400 mg Oral Daily  . neomycin-bacitracin-polymyxin   Topical BID  . pantoprazole  40 mg Oral Daily  . potassium chloride  40 mEq Oral TID  . ranolazine  500 mg Oral BID  . sildenafil  20 mg Oral TID  . spironolactone  25 mg Oral BID  . temazepam  30 mg Oral QHS  . torsemide  100 mg Oral BID  . vitamin E  400 Units Oral Daily    Infusions: . milrinone 0.375 mcg/kg/min (04/08/15 0141)    PRN Medications: acetaminophen, albuterol, cyclobenzaprine, ondansetron (ZOFRAN) IV, oxyCODONE-acetaminophen   Assessment/Plan   1. Acute on chronic systolic CHF: Nonischemic cardiomyopathy.  EF 30-35% in the past, difficult to assess on echo this admission due to poor windows. Nonischemic cardiomyopathy.  Volume status improved, co-ox stable at 62% and CVP 10. Now on po diuretics.   - Continue current dose of milrinone, 0.375 mcg/kg/min, for home.   - Continue torsemide 100 mg bid. - Continue hydralazine 25 mg tid with no Imdur as she will be on sildenafil.  - Continue spironolactone, low dose digoxin. Digoxin level ok.   - Beta blocker held in setting of cardiogenic shock - Poor candidate for transplant given obesity.  Evaluating for LVAD => long-term milrinone is poor option due to ventricular ectopy/history of VT and ICD shocks. Dr Donata Clay has seen.  Given history of GI bleeding from AVMs, she had GI evaluation => given negative FOBT and normal hemoglobin they recommend against repeat EGD.  2. Atypical CP: Trop negative. Normal coronaries on cath 4/15 3. VT/VF with ICD resuscitation:  Now on amiodarone + ranolazine, ectopy appears to have calmed.  Continue at home.  4. CKD Stage 3: Follow creatinine closely, relatively stable. 5. Pulmonary hypertension: Mixed pulmonary venous/pulmonary arterial HTN.  Started Revatio 20 mg tid.   6. GERD 7. DM2 8. OSA with CPAP 9. Morbid obesity 10. Hyponatremia: Fluid restrict, improved.  11. Disposition: Home with home services for milrinone.  Will need to make sure also that she can get Revatio.  Needs CHF clinic followup in 7-10 days.  Meds: Milrinone 0.375 mcg/kg/min, Revatio 20 tid, torsemide 100 bid, KCl 40 tid, ivabradine 2.5 bid, ranolazine 500 bid, amiodarone 200 bid, hydralazine 25 tid, spironolactone 25 bid, ASA 81, digoxin 0.0625 daily, noncardiac meds as prior to admission ([facility] to determine diabetes regimen).   Length of Stay: 12 [name] MD  04/08/2015, 7:27 AM Advanced Heart Failure Team Pager [phone] (M-F; 7a - 4p)  Please contact [facility] for night-coverage after hours (4p -7a ) and weekends on [email or web address]

## 2015-04-08 NOTE — Discharge Summary (Signed)
Advanced Heart Failure Team  Discharge Summary   Patient ID: [name] MRN: [id number], DOB/AGE: December 31, 1965 49 y.o. Admit date: 03/26/2015 D/C date:     04/08/2015   Primary Discharge Diagnoses:  1. Acute on chronic systolic CHF: Nonischemic cardiomyopathy. ECHO 03/2015 EF 30-35% . Cardiogenic shock.  Discharged on milrinone 0.375 mcg.  No BB  2. -. Atypical CP: Trop negative. Normal coronaries on cath 4/15 3. VT/VF with ICD resuscitation:  Device Interrogation with VF. amiodarone + ranolazine 4. CKD Stage 3: 5. Pulmonary hypertension: Mixed pulmonary venous/pulmonary arterial HTN. Started Revatio 20 mg tid.  6. GERD 7. DM2 8. OSA with CPAP 9. Morbid obesity 10. Hyponatremia: 127  11. H/O AVMs - GI consulted   Hospital Course:  [name] is a 49 year old with a history of morbid obesity, obstructive sleep apnea on CPAP, nonischemic cardiomyopathy, chronic systolic and diastolic CHF, EF 78-29% with grade 3 diastolic dysfunction per echo in March 2016, GERD, diabetes mellitus on insulin presented to ED with syncopal episode and progressive worsening shortness of breath.   1. Acute on chronic systolic CHF: Nonischemic cardiomyopathy. EF 30-35% in the past, difficult to assess on echo this admission due to poor windows. Nonischemic cardiomyopathy.  Had RHC on 7/20 with low output physiology. Started on milrinone and unable to wean. In fact milrinone increased to 0.375 mcg at discharged. CO-OX on discharge  62% and CVP 10 after being diuresed with IV lasix and transitioned to torsemide 100 mg bid. She will continue on hydralazine 25 mg tid with no Imdur as she will be on sildenafil. Cotinue spironolactone, low dose digoxin. Digoxin level ok. Beta blocker held in setting of cardiogenic shock.  Referred to cardiac surgery, social work, palliative care, VAD coordinators for LVAD consideration. Evaluating for LVAD => long-term milrinone is poor option due to ventricular ectopy/history of  VT and ICD shocks. Dr Donata Clay has seen. Given history of GI bleeding from AVMs, she had GI evaluation => given negative FOBT and normal hemoglobin they recommend against repeat EGD.  2. Atypical CP: Trop negative. Normal coronaries on cath 4/15 3. VT/VF with ICD resuscitation: Now on amiodarone + ranolazine 4. CKD Stage 3: Follow creatinine closely, relatively stable. 5. Pulmonary hypertension: Mixed pulmonary venous/pulmonary arterial HTN. Started Revatio 20 mg tid.  6. GERD 7. DM2 8. OSA with CPAP 9. Morbid obesity 10. Hyponatremia: Fluid restrict, improved.   She was evaluated by Dr [name] and deemed stable for discharge. [facility] will follow for home milrinone. She will continue to be followed closely in the HF clinic.    RHC 03/31/15 RA = 28 RV = 83/19/30 PA = 86/38 (65) PCW = 36 Fick cardiac output/index = 2.9/1.3 Thermo cardiac output/index = 3.1/1.3 PVR = 10 Arterial sat = 99% PA sat = 45%, 48%   Discharge Weight Range: 263 pounds  Discharge Vitals: Blood pressure 116/75, pulse 80, temperature 98.1 F (36.7 C), temperature source Oral, resp. rate 16, height 5\' 5"  (1.651 m), weight 263 lb 14.3 oz (119.7 kg), last menstrual period 01/19/2014, SpO2 94 %.  Labs: Lab Results  Component Value Date   WBC 5.8 04/08/2015   HGB 14.5 04/08/2015   HCT 43.8 04/08/2015   MCV 93.0 04/08/2015   PLT 259 04/08/2015     Recent Labs Lab 04/08/15 0602  NA 132*  K 4.2  CL 91*  CO2 31  BUN 46*  CREATININE 1.56*  CALCIUM 8.9  PROT 8.5*  BILITOT 1.1  ALKPHOS 103  ALT  29  AST 52*  GLUCOSE 139*   Lab Results  Component Value Date   CHOL 238* 04/07/2015   HDL 65 04/07/2015   LDLCALC 141* 04/07/2015   TRIG 162* 04/07/2015   BNP (last 3 results)  Recent Labs  01/20/15 0040 01/26/15 1100 03/26/15 1633  BNP 166.0* 223.8* 549.5*    ProBNP (last 3 results)  Recent Labs  08/04/14 2339 08/21/14 1117  PROBNP 1727.0* 171.9*     Diagnostic Studies/Procedures    Dg Orthopantogram  04/07/2015   CLINICAL DATA:  Preop LVAD surgery  EXAM: ORTHOPANTOGRAM/PANORAMIC  COMPARISON:  None.  FINDINGS: Multiple missing teeth and restorations. There is lucency around the tooth #4 possibly early periapical abscess. There is lucency overlying the root of tooth #24, possibly artifactual versus abscess.  IMPRESSION: 1. Periapical lucencies involving teeth numbers 4 and 24. Consider dental films for further evaluation.   Electronically Signed   By: Corlis Leak M.D.   On: 04/07/2015 08:54    Discharge Medications     Medication List    STOP taking these medications        carvedilol 6.25 MG tablet  Commonly known as:  COREG     losartan 25 MG tablet  Commonly known as:  COZAAR     phenazopyridine 200 MG tablet  Commonly known as:  PYRIDIUM      TAKE these medications        albuterol 108 (90 BASE) MCG/ACT inhaler  Commonly known as:  PROVENTIL HFA;VENTOLIN HFA  Inhale 2 puffs into the lungs every 6 (six) hours as needed for wheezing or shortness of breath.     allopurinol 300 MG tablet  Commonly known as:  ZYLOPRIM  Take 1 tablet (300 mg total) by mouth daily.     amiodarone 200 MG tablet  Commonly known as:  PACERONE  Take 1 tablet (200 mg total) by mouth 2 (two) times daily.     aspirin 81 MG EC tablet  Take 1 tablet (81 mg total) by mouth daily.     CORLANOR 5 MG Tabs tablet  Generic drug:  ivabradine  TAKE (1/2) TABLET BY MOUTH TWICE DAILY WITH A MEAL.     cyclobenzaprine 10 MG tablet  Commonly known as:  FLEXERIL  Take 10 mg by mouth 3 (three) times daily as needed for muscle spasms.     digoxin 0.125 MG tablet  Commonly known as:  LANOXIN  Take 0.5 tablets (0.0625 mg total) by mouth daily.     DULoxetine 30 MG capsule  Commonly known as:  CYMBALTA  Take 30 mg by mouth daily.     ferrous sulfate 325 (65 FE) MG tablet  Take 325 mg by mouth 3 (three) times daily.     gabapentin 300 MG capsule  Commonly known as:  NEURONTIN  Take  600 mg by mouth 4 (four) times daily.     hydrALAZINE 25 MG tablet  Commonly known as:  APRESOLINE  Take 1 tablet (25 mg total) by mouth every 8 (eight) hours.     insulin aspart 100 UNIT/ML injection  Commonly known as:  novoLOG  Inject 0-20 Units into the skin 3 (three) times daily with meals.     insulin detemir 100 UNIT/ML injection  Commonly known as:  LEVEMIR  Inject 0.58 mLs (58 Units total) into the skin 2 (two) times daily.     Linaclotide 290 MCG Caps capsule  Commonly known as:  LINZESS  Take 1 capsule (290 mcg total) by  mouth daily. 30 minutes before breakfast     magnesium oxide 400 (241.3 MG) MG tablet  Commonly known as:  MAG-OX  Take 1 tablet (400 mg total) by mouth daily.     metolazone 2.5 MG tablet  Commonly known as:  ZAROXOLYN  Take 1 tablet (2.5 mg total) by mouth as needed. Take one tablet by mouth 30 minutes before taking morning dose of Torsemide on Tuesdays     milrinone 20 MG/100ML Soln infusion  Commonly known as:  PRIMACOR  Inject 47.5125 mcg/min into the vein continuous.     omeprazole 40 MG capsule  Commonly known as:  PRILOSEC  Take 1 capsule (40 mg total) by mouth daily.     oxyCODONE-acetaminophen 10-325 MG per tablet  Commonly known as:  PERCOCET  Take 0.5-1 tablets by mouth every 4 (four) hours as needed for pain.     potassium chloride SA 20 MEQ tablet  Commonly known as:  K-DUR,KLOR-CON  Take 2 tablets (40 mEq total) by mouth 3 (three) times daily.     ranolazine 500 MG 12 hr tablet  Commonly known as:  RANEXA  Take 1 tablet (500 mg total) by mouth 2 (two) times daily.     sildenafil 20 MG tablet  Commonly known as:  REVATIO  Take 1 tablet (20 mg total) by mouth 3 (three) times daily.     spironolactone 25 MG tablet  Commonly known as:  ALDACTONE  Take 1 tablet (25 mg total) by mouth 2 (two) times daily.     temazepam 30 MG capsule  Commonly known as:  RESTORIL  Take 30 mg by mouth at bedtime.     torsemide 100 MG tablet   Commonly known as:  DEMADEX  Take 1 tablet (100 mg total) by mouth 2 (two) times daily.     vitamin E 400 UNIT capsule  Take 400 Units by mouth daily.        Disposition   The patient will be discharged in stable condition to home. Discharge Instructions    Contraindication to ARB at discharge    Complete by:  As directed      Diet - low sodium heart healthy    Complete by:  As directed      Heart Failure patients record your daily weight using the same scale at the same time of day    Complete by:  As directed      Increase activity slowly    Complete by:  As directed           Follow-up Information    Follow up with [facility].   Why:  Home Health RN    Contact information:   [address] [place] [address] [phone]       Follow up with [name], MD. Go on 04/15/2015.   Specialty:  Cardiology   Why:  at 1120 am in the [facility] Clinic--gate code [id number]--please bring all medications to appt   Contact information:   [address] [place] [address] [phone]         Duration of Discharge Encounter: Greater than 35 minutes   Signed, Yaritza Leist NP-C  04/08/2015, 11:29 AM

## 2015-04-08 NOTE — Progress Notes (Signed)
VAD education for evaluation and possible LVAD continued with pt and fiance per VAD coordinator. Explained that LVAD can be implanted for two indications: 1. Bridge to transplant - used for patients who cannot safely wait for heart transplant.  2. Destination therapy - used for patients until end of life or recovery of heart function.  Pt has received VAD educational packet along with DVD's and has been reviewing. Equipment demonstration with HM II training loop included the following:  a) power module  b) system controller  c) universal Magazine features editor  d) battery clips  e) Batteries  f)  Perc lock  g) Percutaneous lead   Demonstrated:  a) changing power source on system controller from tethered (power module) to untethered (battery) mode  b) changing power source on system controller from untethered (battery) to tethered (power module) mode  c) how to monitor battery life both on the system controller and on each individual battery  d) changing batteries   Stressed importance of never disconnecting power from both controller power leads at the same time, and never disconnecting both batteries at the same time, or the pump will stop.   Identified the following changes in activities of daily living with pump:  1. No driving for at least three months and then only if doctor gives permission to do so.  2. No tub baths while pump implanted, and shower only if doctor gives permission.  3. No swimming or submersion in water while implanted with pump.  4. No contact sports or engaging in jumping activities.  5. Always have a backup controller, charged spare batteries, and battery clips nearby at all times in case of emergency.   6. Plan to sleep only when connected to the power module.  7. Keep a backup system controller, charged batteries, battery clips, and flashlight near you during sleep in case of electrical power outage.  8. Exit site care including dressing changes, monitoring for  infection, and importance of keeping percutaneous lead stabilized at all times.   Also discussed need for 24 hour/7 day week caregivers; pt designated her finace.  Discussed with pt and family that they will be required to purchase dressing supplies as long as patient has the VAD in place.    Patient and caregiver asked appropriate questions, had good interaction with VAD coordinator, and verbalized understanding of above.

## 2015-04-08 NOTE — Progress Notes (Signed)
CARDIAC REHAB PHASE I   PRE:  Rate/Rhythm: 77 pacing    BP: sitting 103/66    SaO2:   MODE:  Ambulation: 540 ft   POST:  Rate/Rhythm: 90 pacing    BP: sitting 116/72     SaO2:   Pt looks great. Did walk test with slow pace, no rest, 500 ft. Denied problems, talked entire walk. Discussed low sodium, 2000 mg with pt. Gave her diet sheets to review with sodium restrictions and high sodium foods. Pt thankful. Encouraged her to weigh daily and walk x3 qd. Voiced understanding.  [id number]  [name] CES, ACSM 04/08/2015 2:51 PM

## 2015-04-08 NOTE — Progress Notes (Addendum)
[facility] PROGRESS NOTE Interim History: 49 year old female with morbid obesity, obstructive sleep apnea on CPAP, nonischemic cardiomyopathy, chronic systolic and diastolic CHF, EF 16-10% with grade 3 diastolic dysfunction per echo in March 2016, GERD, diabetes mellitus on insulin presented to ED with syncopal episode and progressive worsening shortness of breath. Syncopal episode due to V-tach catch by AICD. With ADHF on IV milrinoe and diuretics. Has diurese well, now on oral torsemide. Had few episode of VT/VF on 7.23.2016. Loaded with amio.  Filed Weights   04/06/15 0324 04/07/15 0500 04/08/15 0500  Weight: 127.3 kg (280 lb 10.3 oz) 120.203 kg (265 lb) 119.7 kg (263 lb 14.3 oz)        Intake/Output Summary (Last 24 hours) at 04/08/15 0730 Last data filed at 04/07/15 1500  Gross per 24 hour  Intake    240 ml  Output    800 ml  Net   -560 ml     Assessment/Plan: Syncope/ VT/VF in the setting of hypokalemia on admisison: Per advance HF team. She was resuscitated with an ICD shock, recurrent VF/VT on 7.23.2016 and shock. Appreciate cards assistance. She was loaded with amiodarone. And will cont Amio as an outpatient.  Acute on chronic combined systolic and diastolic heart failure/Cardiogenic shock/nonischemic cardiomyopathy: Per Heart failure team. Advance HF team recommended Sindenafil. RHC performed on 7.21.2016,now on milrinone. PICC line place on 7.22.2016. CT surgery evaluated for LVAD. GI was consulted who recommended FOBT. Home on torsemide, aldactone, milrinone gtt, Ivabradine and digoxin.  Insulin-dependent diabetes mellitus: Her A1c was 7.5. Her Lantus was titrated to 58 units BID. Would recommend she goes home on a BID dosing to the large quantity of insulin. Will cont home SSI: - Inject 0-20 Units into the skin 3 (three) times daily with meals. Per sliding scale: 120 - 150 = 2 units, 151 - 200 = 4 units, 201 - 250 = 7 units, 251 - 300 = 11 units, 301 -  350 = 15 units, 351 and above = 20 units  GERD: Continue Protonix.  Essential hypertension: Further management per HF team.  Atypical chest pain: CT imaging of the chest negative for PE, ? Due to ADHF.  Morbid obesity: Counseling.  Code Status: full Family Communication: none  Disposition Plan: home in 3-5 days    Antibiotics:  None  HPI/Subjective: No complains feels great  Objective: Filed Vitals:   04/07/15 2018 04/07/15 2300 04/08/15 0400 04/08/15 0500  BP: 99/75 150/96 126/78   Pulse: 79 84 80   Temp: 98.2 F (36.8 C) 98.1 F (36.7 C) 98 F (36.7 C)   TempSrc: Oral Oral Oral   Resp: Height:      Weight:    119.7 kg (263 lb 14.3 oz)  SpO2: 96% 95% 95%      Exam:  General: Alert, awake, oriented x3, in no acute distress.  HEENT: No bruits, no goiter.  Heart: Regular rate and rhythm.  Lungs: Good air movement, clear Neuro: Grossly intact, nonfocal.   Data Reviewed: Basic Metabolic Panel:  Recent Labs Lab 04/02/15 0510 04/03/15 0523  04/05/15 0515 04/05/15 1515 04/06/15 0515 04/07/15 0519 04/08/15 0602  NA 135 131*  < > 129* 128* 129* 127* 132*  K 4.5 3.9  < > 4.4 4.0 4.2 4.3 4.2  CL 84* 80*  < > 81* 80* 80* 84* 91*  CO2 42* 40*  < > 38* 35* 38* 34* 31  GLUCOSE 135* 232*  < > 249* 274*  186* 194* 139*  BUN 32* 34*  < > 40* 42* 43* 44* 46*  CREATININE 1.51* 1.23*  < > 1.42* 1.47* 1.66* 1.39* 1.56*  CALCIUM 9.0 8.9  < > 8.9 9.0 9.0 8.7* 8.9  MG 2.0 2.1  --  2.1  --   --  2.3  --   < > = values in this interval not displayed. Liver Function Tests:  Recent Labs Lab 04/08/15 0602  AST 52*  ALT 29  ALKPHOS 103  BILITOT 1.1  PROT 8.5*  ALBUMIN 3.5   No results for input(s): LIPASE, AMYLASE in the last 168 hours. No results for input(s): AMMONIA in the last 168 hours. CBC:  Recent Labs Lab 04/05/15 0515 04/06/15 0515 04/07/15 0519 04/08/15 0602  WBC 7.1 6.3 5.6 5.8  HGB 15.0 15.0 14.4 14.5  HCT 45.9 45.3 43.2 43.8    MCV 94.1 93.6 92.7 93.0  PLT 279 280 264 259   Cardiac Enzymes: No results for input(s): CKTOTAL, CKMB, CKMBINDEX, TROPONINI in the last 168 hours. BNP (last 3 results)  Recent Labs  01/20/15 0040 01/26/15 1100 03/26/15 1633  BNP 166.0* 223.8* 549.5*    ProBNP (last 3 results)  Recent Labs  08/04/14 2339 08/21/14 1117  PROBNP 1727.0* 171.9*    CBG:  Recent Labs Lab 04/06/15 2151 04/07/15 0805 04/07/15 1206 04/07/15 1541 04/07/15 2139  GLUCAP 155* 129* 120* 101* 134*    Recent Results (from the past 240 hour(s))  MRSA PCR Screening     Status: None   Collection Time: 04/02/15 11:45 AM  Result Value Ref Range Status   MRSA by PCR NEGATIVE NEGATIVE Final    Comment:        The GeneXpert MRSA Assay (FDA approved for NASAL specimens only), is one component of a comprehensive MRSA colonization surveillance program. It is not intended to diagnose MRSA infection nor to guide or monitor treatment for MRSA infections.      Studies: Dg Orthopantogram  04/07/2015   CLINICAL DATA:  Preop LVAD surgery  EXAM: ORTHOPANTOGRAM/PANORAMIC  COMPARISON:  None.  FINDINGS: Multiple missing teeth and restorations. There is lucency around the tooth #4 possibly early periapical abscess. There is lucency overlying the root of tooth #24, possibly artifactual versus abscess.  IMPRESSION: 1. Periapical lucencies involving teeth numbers 4 and 24. Consider dental films for further evaluation.   Electronically Signed   By: Corlis Leak M.D.   On: 04/07/2015 08:54    Scheduled Meds: . allopurinol  300 mg Oral Daily  . amiodarone  200 mg Oral BID  . antiseptic oral rinse  7 mL Mouth Rinse BID  . aspirin EC  81 mg Oral Daily  . digoxin  0.0625 mg Oral Daily  . docusate sodium  200 mg Oral BID  . DULoxetine  30 mg Oral Daily  . ferrous sulfate  325 mg Oral TID  . gabapentin  600 mg Oral QID  . heparin  5,000 Units Subcutaneous 3 times per day  . hydrALAZINE  25 mg Oral 3 times per day   . insulin aspart  0-5 Units Subcutaneous QHS  . insulin aspart  0-9 Units Subcutaneous TID WC  . insulin detemir  58 Units Subcutaneous BID  . ivabradine  2.5 mg Oral BID WC  . Linaclotide  290 mcg Oral Daily  . magnesium oxide  400 mg Oral Daily  . neomycin-bacitracin-polymyxin   Topical BID  . pantoprazole  40 mg Oral Daily  . potassium chloride  40  mEq Oral TID  . ranolazine  500 mg Oral BID  . sildenafil  20 mg Oral TID  . spironolactone  25 mg Oral BID  . temazepam  30 mg Oral QHS  . torsemide  100 mg Oral BID  . vitamin E  400 Units Oral Daily   Continuous Infusions: . milrinone 0.375 mcg/kg/min (04/08/15 0141)     Marinda Elk  [facility] Pager [phone] If 7PM-7AM, please contact night-coverage at [email or web address], password [facility] 04/08/2015, 7:30 AM  LOS: 12 days

## 2015-04-08 NOTE — Care Management Important Message (Signed)
Important Message  Patient Details  Name: [name] MRN: [id number] Date of Birth: 03/07/1966   Medicare Important Message Given:  Yes-second notification given    [name] 04/08/2015, 12:02 PM

## 2015-04-09 ENCOUNTER — Other Ambulatory Visit: Payer: Self-pay | Admitting: *Deleted

## 2015-04-09 NOTE — Patient Outreach (Signed)
TOC call week #1 This RNCM called patient as this RNCM is covering for Altamese Dilling, RN, patient assigned RNCM  Subjective:  Patient discharged form hospital yesterday. She is now on milrinone IV. She states HH services have started and they will be out when she has to change the bag weekly until she is comfortable with it.  Patient weighing daily and keeping  A log. Patient taking meds according to her discharge summary. Patient has appointment with Cardiologist/HF clinic on 8/4, she has transportation. Patient states she has her daughter and fiance to help her at home and they are good support.   Assessment: TOC assessment completed See plan of care. Gave patient RNCM contact information and advised to call [facility] services or HF clinic for any questions over weekend.  Plan:  Give report to Marja Kays, RN Continue week TOC calls or visits. Alben Spittle. Albertha Ghee, RN, BSN, CCM  [facility] [employer] [phone]

## 2015-04-12 ENCOUNTER — Encounter: Payer: Self-pay | Admitting: Internal Medicine

## 2015-04-12 ENCOUNTER — Other Ambulatory Visit: Payer: Self-pay | Admitting: *Deleted

## 2015-04-12 DIAGNOSIS — R55 Syncope and collapse: Secondary | ICD-10-CM | POA: Insufficient documentation

## 2015-04-12 DIAGNOSIS — R7989 Other specified abnormal findings of blood chemistry: Secondary | ICD-10-CM | POA: Insufficient documentation

## 2015-04-12 NOTE — Patient Outreach (Signed)
[facility]) Care Management  04/12/2015  KAMARIAH KIRSH 1966-04-15 [id number]  I spoke with Ms. Scheib by phone today. She continues to do well at home on IV Milrinone. She is followed closely by [facility]. Ms. Koshiol is asymptomatic by her report today. She says she feels much better and is excited at the possibility of getting a LVAD (Left Ventricular Assist Device). She knew she was to have a PFT (pulmonary function test) but was unsure about the date/time. I confirmed that she has an appointment for PFT next Tuesday, 04/20/15 at 9am at [facility] in the respiratory therapy department. Ms. Ochoa is also returning to the Heart Failure Clinic this week on Thursday for heart failure follow up appointment.   I requested that Rexene Alberts, LVAD coordinator contact Ms. Ratterree to address her questions about PFT. I will call Ms. Leydig again next week and have confirmed that she has the phone contact for her home health nurse and for me should she have needs/questions/concerns.    Marja Kays MHA,BSN,RN,CCM [facility] Care Management  [phone]

## 2015-04-13 LAB — FACTOR 5 LEIDEN

## 2015-04-15 ENCOUNTER — Ambulatory Visit (HOSPITAL_BASED_OUTPATIENT_CLINIC_OR_DEPARTMENT_OTHER)
Admission: RE | Admit: 2015-04-15 | Discharge: 2015-04-15 | Disposition: A | Discharge status: Home / Self Care | Payer: Medicare Other | Attending: Cardiology | Admitting: Cardiology

## 2015-04-15 ENCOUNTER — Encounter (HOSPITAL_COMMUNITY): Payer: Self-pay | Admitting: Emergency Medicine

## 2015-04-15 ENCOUNTER — Telehealth: Payer: Self-pay | Admitting: Physician Assistant

## 2015-04-15 ENCOUNTER — Inpatient Hospital Stay (HOSPITAL_COMMUNITY)
Admission: EM | Admit: 2015-04-15 | Discharge: 2015-04-28 | DRG: 287 | Disposition: A | Discharge status: Home Health | Payer: Medicare Other | Attending: Internal Medicine | Admitting: Internal Medicine

## 2015-04-15 ENCOUNTER — Emergency Department (HOSPITAL_COMMUNITY): Payer: Medicare Other

## 2015-04-15 ENCOUNTER — Other Ambulatory Visit (HOSPITAL_COMMUNITY)
Admission: RE | Admit: 2015-04-15 | Discharge: 2015-04-15 | Disposition: A | Discharge status: Home / Self Care | Payer: Medicare Other | Attending: Internal Medicine | Admitting: Internal Medicine

## 2015-04-15 ENCOUNTER — Telehealth (HOSPITAL_COMMUNITY): Payer: Self-pay | Admitting: *Deleted

## 2015-04-15 ENCOUNTER — Other Ambulatory Visit: Payer: Self-pay

## 2015-04-15 ENCOUNTER — Encounter: Payer: Self-pay | Admitting: Licensed Clinical Social Worker

## 2015-04-15 VITALS — BP 110/66 | HR 80 | Wt 276.8 lb

## 2015-04-15 DIAGNOSIS — I270 Primary pulmonary hypertension: Secondary | ICD-10-CM

## 2015-04-15 DIAGNOSIS — Z7982 Long term (current) use of aspirin: Secondary | ICD-10-CM

## 2015-04-15 DIAGNOSIS — Z79899 Other long term (current) drug therapy: Secondary | ICD-10-CM

## 2015-04-15 DIAGNOSIS — K219 Gastro-esophageal reflux disease without esophagitis: Secondary | ICD-10-CM | POA: Diagnosis present

## 2015-04-15 DIAGNOSIS — I5023 Acute on chronic systolic (congestive) heart failure: Secondary | ICD-10-CM | POA: Diagnosis not present

## 2015-04-15 DIAGNOSIS — J9610 Chronic respiratory failure, unspecified whether with hypoxia or hypercapnia: Secondary | ICD-10-CM | POA: Diagnosis present

## 2015-04-15 DIAGNOSIS — G4733 Obstructive sleep apnea (adult) (pediatric): Secondary | ICD-10-CM | POA: Diagnosis not present

## 2015-04-15 DIAGNOSIS — I493 Ventricular premature depolarization: Secondary | ICD-10-CM | POA: Diagnosis present

## 2015-04-15 DIAGNOSIS — Z7189 Other specified counseling: Secondary | ICD-10-CM

## 2015-04-15 DIAGNOSIS — K589 Irritable bowel syndrome without diarrhea: Secondary | ICD-10-CM | POA: Diagnosis present

## 2015-04-15 DIAGNOSIS — I472 Ventricular tachycardia, unspecified: Secondary | ICD-10-CM

## 2015-04-15 DIAGNOSIS — Z9989 Dependence on other enabling machines and devices: Secondary | ICD-10-CM

## 2015-04-15 DIAGNOSIS — Z833 Family history of diabetes mellitus: Secondary | ICD-10-CM

## 2015-04-15 DIAGNOSIS — I272 Other secondary pulmonary hypertension: Secondary | ICD-10-CM | POA: Diagnosis present

## 2015-04-15 DIAGNOSIS — I447 Left bundle-branch block, unspecified: Secondary | ICD-10-CM | POA: Diagnosis present

## 2015-04-15 DIAGNOSIS — Z888 Allergy status to other drugs, medicaments and biological substances status: Secondary | ICD-10-CM

## 2015-04-15 DIAGNOSIS — Z87891 Personal history of nicotine dependence: Secondary | ICD-10-CM

## 2015-04-15 DIAGNOSIS — N183 Chronic kidney disease, stage 3 (moderate): Secondary | ICD-10-CM | POA: Diagnosis present

## 2015-04-15 DIAGNOSIS — E876 Hypokalemia: Secondary | ICD-10-CM | POA: Diagnosis present

## 2015-04-15 DIAGNOSIS — I129 Hypertensive chronic kidney disease with stage 1 through stage 4 chronic kidney disease, or unspecified chronic kidney disease: Secondary | ICD-10-CM | POA: Diagnosis present

## 2015-04-15 DIAGNOSIS — D509 Iron deficiency anemia, unspecified: Secondary | ICD-10-CM | POA: Diagnosis present

## 2015-04-15 DIAGNOSIS — I4901 Ventricular fibrillation: Secondary | ICD-10-CM | POA: Diagnosis not present

## 2015-04-15 DIAGNOSIS — Z95810 Presence of automatic (implantable) cardiac defibrillator: Secondary | ICD-10-CM

## 2015-04-15 DIAGNOSIS — Z9012 Acquired absence of left breast and nipple: Secondary | ICD-10-CM | POA: Diagnosis present

## 2015-04-15 DIAGNOSIS — E1122 Type 2 diabetes mellitus with diabetic chronic kidney disease: Secondary | ICD-10-CM | POA: Diagnosis present

## 2015-04-15 DIAGNOSIS — G629 Polyneuropathy, unspecified: Secondary | ICD-10-CM | POA: Diagnosis present

## 2015-04-15 DIAGNOSIS — Z79891 Long term (current) use of opiate analgesic: Secondary | ICD-10-CM

## 2015-04-15 DIAGNOSIS — Z6841 Body Mass Index (BMI) 40.0 and over, adult: Secondary | ICD-10-CM

## 2015-04-15 DIAGNOSIS — I340 Nonrheumatic mitral (valve) insufficiency: Secondary | ICD-10-CM | POA: Diagnosis present

## 2015-04-15 DIAGNOSIS — I429 Cardiomyopathy, unspecified: Secondary | ICD-10-CM | POA: Diagnosis present

## 2015-04-15 DIAGNOSIS — Z515 Encounter for palliative care: Secondary | ICD-10-CM

## 2015-04-15 DIAGNOSIS — I509 Heart failure, unspecified: Secondary | ICD-10-CM | POA: Diagnosis not present

## 2015-04-15 DIAGNOSIS — Z9119 Patient's noncompliance with other medical treatment and regimen: Secondary | ICD-10-CM | POA: Diagnosis present

## 2015-04-15 DIAGNOSIS — I5022 Chronic systolic (congestive) heart failure: Secondary | ICD-10-CM

## 2015-04-15 DIAGNOSIS — E1129 Type 2 diabetes mellitus with other diabetic kidney complication: Secondary | ICD-10-CM | POA: Diagnosis present

## 2015-04-15 DIAGNOSIS — Z794 Long term (current) use of insulin: Secondary | ICD-10-CM

## 2015-04-15 DIAGNOSIS — Z8249 Family history of ischemic heart disease and other diseases of the circulatory system: Secondary | ICD-10-CM

## 2015-04-15 LAB — BASIC METABOLIC PANEL
ANION GAP: 7 (ref 5–15)
Anion gap: 14 (ref 5–15)
BUN: 34 mg/dL — ABNORMAL HIGH (ref 6–20)
BUN: 29 mg/dL — ABNORMAL HIGH (ref 6–20)
CHLORIDE: 98 mmol/L — AB (ref 101–111)
CO2: 31 mmol/L (ref 22–32)
CO2: 25 mmol/L (ref 22–32)
Calcium: 8.6 mg/dL — ABNORMAL LOW (ref 8.9–10.3)
Calcium: 8.7 mg/dL — ABNORMAL LOW (ref 8.9–10.3)
Chloride: 97 mmol/L — ABNORMAL LOW (ref 101–111)
Creatinine, Ser: 1.35 mg/dL — ABNORMAL HIGH (ref 0.44–1.00)
Creatinine, Ser: 1.38 mg/dL — ABNORMAL HIGH (ref 0.44–1.00)
GFR calc Af Amer: 52 mL/min — ABNORMAL LOW (ref >60)
GFR calc Af Amer: 51 mL/min — ABNORMAL LOW (ref >60)
GFR calc non Af Amer: 45 mL/min — ABNORMAL LOW (ref >60)
GFR calc non Af Amer: 44 mL/min — ABNORMAL LOW (ref >60)
Glucose, Bld: 116 mg/dL — ABNORMAL HIGH (ref 65–99)
Glucose, Bld: 103 mg/dL — ABNORMAL HIGH (ref 65–99)
Potassium: 3.8 mmol/L (ref 3.5–5.1)
Potassium: 4 mmol/L (ref 3.5–5.1)
Sodium: 136 mmol/L (ref 135–145)
Sodium: 136 mmol/L (ref 135–145)

## 2015-04-15 LAB — CBC
HCT: 41.8 % (ref 36.0–46.0)
Hemoglobin: 13.4 g/dL (ref 12.0–15.0)
MCH: 30.5 pg (ref 26.0–34.0)
MCHC: 32.1 g/dL (ref 30.0–36.0)
MCV: 95 fL (ref 78.0–100.0)
Platelets: 280 10*3/uL (ref 150–400)
RBC: 4.4 MIL/uL (ref 3.87–5.11)
RDW: 15.9 % — ABNORMAL HIGH (ref 11.5–15.5)
WBC: 5.2 10*3/uL (ref 4.0–10.5)

## 2015-04-15 LAB — CBC WITH DIFFERENTIAL/PLATELET
Basophils Absolute: 0 10*3/uL (ref 0.0–0.1)
Basophils Relative: 1 % (ref 0–1)
Eosinophils Absolute: 0.2 10*3/uL (ref 0.0–0.7)
Eosinophils Relative: 4 % (ref 0–5)
HCT: 40.4 % (ref 36.0–46.0)
Hemoglobin: 12.7 g/dL (ref 12.0–15.0)
Lymphocytes Relative: 32 % (ref 12–46)
Lymphs Abs: 1.5 10*3/uL (ref 0.7–4.0)
MCH: 30.5 pg (ref 26.0–34.0)
MCHC: 31.4 g/dL (ref 30.0–36.0)
MCV: 96.9 fL (ref 78.0–100.0)
MONOS PCT: 8 % (ref 3–12)
Monocytes Absolute: 0.4 10*3/uL (ref 0.1–1.0)
NEUTROS ABS: 2.6 10*3/uL (ref 1.7–7.7)
Neutrophils Relative %: 55 % (ref 43–77)
PLATELETS: 339 10*3/uL (ref 150–400)
RBC: 4.17 MIL/uL (ref 3.87–5.11)
RDW: 15.9 % — AB (ref 11.5–15.5)
WBC: 4.7 10*3/uL (ref 4.0–10.5)

## 2015-04-15 LAB — I-STAT TROPONIN, ED: Troponin i, poc: 0.01 ng/mL (ref 0.00–0.08)

## 2015-04-15 LAB — BRAIN NATRIURETIC PEPTIDE
B NATRIURETIC PEPTIDE 5: 172 pg/mL — AB (ref 0.0–100.0)
B Natriuretic Peptide: 159.1 pg/mL — ABNORMAL HIGH (ref 0.0–100.0)

## 2015-04-15 LAB — MAGNESIUM: MAGNESIUM: 1.9 mg/dL (ref 1.7–2.4)

## 2015-04-15 MED ORDER — TORSEMIDE 100 MG PO TABS: 100.0000 mg | ORAL_TABLET | Freq: Two times a day (BID) | ORAL | Status: AC

## 2015-04-15 MED ORDER — MAGNESIUM OXIDE 400 (241.3 MG) MG PO TABS: 400.0000 mg | ORAL_TABLET | Freq: Two times a day (BID) | ORAL | Status: AC

## 2015-04-15 MED ORDER — POTASSIUM CHLORIDE CRYS ER 20 MEQ PO TBCR: 60.0000 meq | EXTENDED_RELEASE_TABLET | Freq: Two times a day (BID) | ORAL | Status: DC

## 2015-04-15 MED ORDER — METOLAZONE 2.5 MG PO TABS: 2.5000 mg | ORAL_TABLET | ORAL | Status: DC

## 2015-04-15 NOTE — ED Provider Notes (Signed)
CSN: [id number]     Arrival date & time 04/15/15  5284 History  This chart was scribed for Eber Hong, MD by Doreatha Martin, ED Scribe. This patient was seen in room D33C/D33C and the patient's care was started at 11:52 PM.     Chief Complaint  Patient presents with  . Shortness of Breath  . Leg Swelling   The history is provided by the patient. No language interpreter was used.    HPI Comments: [name] is a 49 y.o. female with Hx of CHF, LBBB, HTN, pacemaker, defibrillator who presents to the Emergency Department complaining of moderate SOB onset this evening. Pt states associated leg swelling, HA, dizziness, palpitations, difficulty walking. She reports her weight has gone up 3lbs every day for a week (electronic scale), totaling 11 lbs. She states she went to the heart failure clinic this morning and they found the same weight change. Pt was given Metolazone this morning. She notes that she was on the medication previously and it caused her pacemaker to go off 22 times, so the medication was discontinued a few months ago. Pt was seen in the ED on 03/26/15 for PVCs and was admitted for 2 weeks. Pt states she is working to qualify for an LVAD by going for a lung test in 5 days and losing weight. She denies fever, CP. She also denies her defibrillator firing today.    Past Medical History  Diagnosis Date  . CHF (congestive heart failure)     a. EF 30-35%, RV mildly dilated (difficult study 11/2013) b. RHC (12/2013): RA 38/37 (35), RV 90/28, PA 95/51 (71), PCWP 54, PA 40%, AO 96 %, CO/CI Fick 3.2/1.4, PVR 5.3   . Nonischemic cardiomyopathy 12/2013    a. LHC (12/2013) normal coronary anatomy   . Mitral regurgitation     moderate to severe  . LBBB (left bundle branch block)   . HTN (hypertension)   . Asthma   . IBS (irritable bowel syndrome)     with primarily constipation  . Morbid obesity   . GERD (gastroesophageal reflux disease)   . IDA (iron deficiency anemia)     2o TO SB AVMS, Hx  parenteral iron Dr Mariel Sleet  . AVM (arteriovenous malformation)     Small bowel, s/p duble balloon enteroscopy/APC jejunum Dr Gwinda Passe [facility]) 01/23/2011  . Peripheral neuropathy   . OSA (obstructive sleep apnea)     on CPAP qhs  . Diabetes mellitus without complication   . Torsades de pointes     a. appropriate ICD therapy 12/2014 in setting of hypokalemia  . Pneumonia    Past Surgical History  Procedure Laterality Date  . Appendectomy    . Cholecystectomy      biliary dyskinesia  . Mastectomy  2003    left, partial  . Knee arthroscopy  2005    left  . Small bowel enteroscopy  MAY 2012 [facility] DR. GILLIAM    SB AVMS s/p APC  . Colonoscopy  OCT 2010/SEP 2011    tortuous colon, 3 polyps-benign(2010),   . Upper gastrointestinal endoscopy  '08, '10, SEP 2011    mild antral gastritis (2010), negative SB bx (2010), incomlpete Schatzki's ring (2011)  . Small bowel enteroscopy  SEP 2011 [facility]    Fields-NO AVMS  . Givens capsule study  NOV 2010     NO AVMS, normal  . Irrigation and debridement abscess  06/25/2012    Procedure: MINOR INCISION AND DRAINAGE OF ABSCESS;  Surgeon:  [name], MD;  Location: [facility] ORS;  Service: General;  Laterality: [name];  Incision & Drainage of Infected Sebaceous Cyst on Chest  . Breast surgery    . Implantable cardioverter defibrillator generator change Left 02/15/2012    BSX CRTD   . Left and right heart catheterization with coronary/graft angiogram  12/12/2013    Procedure: LEFT AND RIGHT HEART CATHETERIZATION WITH Isabel Caprice;  Surgeon: Peter M Swaziland, MD;  Location: [facility] CATH LAB;  Service: Cardiovascular;;  . Right heart catheterization [name] 01/06/2015    Procedure: RIGHT HEART CATH;  Surgeon: Laurey Morale, MD;  Location: [facility] CATH LAB;  Service: Cardiovascular;  Laterality: [name];  . Esophagogastroduodenoscopy  Sept 2013    [facility]: diminutive duodenal polyp, polyp in gastric cardia, path with benign duodenal mucosa with focally  prominent Brunner's glands. Fundic gland polyps.   . Cardiac catheterization [name] 03/31/2015    Procedure: Right Heart Cath;  Surgeon: Dolores Patty, MD;  Location: [facility] INVASIVE CV LAB;  Service: Cardiovascular;  Laterality: [name];   Family History  Problem Relation Age of Onset  . Colon cancer Paternal Uncle   . Cervical cancer Mother   . Hypertension Mother   . Cancer Mother   . Heart disease Father   . Hypertension Father   . GER disease Father   . Heart attack Father   . Bleeding Disorder Father   . Diabetes Sister   . Heart disease Sister   . Hypertension Sister    History  Substance Use Topics  . Smoking status: Former Smoker -- 0.00 packs/day for 0 years    Types: Cigarettes  . Smokeless tobacco: Former Neurosurgeon     Comment: Quit x 4 years  . Alcohol Use: No   OB History    Gravida Para Term Preterm AB TAB SAB Ectopic Multiple Living   6 1 1  5  3 2        Review of Systems  Constitutional: Negative for fever.  Respiratory: Positive for shortness of breath.   Cardiovascular: Positive for palpitations and leg swelling. Negative for chest pain.  Musculoskeletal: Negative for arthralgias.  Neurological: Positive for dizziness and headaches.  All other systems reviewed and are negative.  Allergies  Cymbalta; Trazodone and nefazodone; and Lyrica  Home Medications   Prior to Admission medications   Medication Sig Start Date End Date Taking? Authorizing Provider  metolazone (ZAROXOLYN) 2.5 MG tablet Take 1 tablet (2.5 mg total) by mouth once a week. Take 30 minutes before taking morning dose of Torsemide on MONDAYS. 04/15/15  Yes [name] D Clegg, NP  albuterol (PROVENTIL HFA;VENTOLIN HFA) 108 (90 BASE) MCG/ACT inhaler Inhale 2 puffs into the lungs every 6 (six) hours as needed for wheezing or shortness of breath.    Historical Provider, MD  allopurinol (ZYLOPRIM) 300 MG tablet Take 1 tablet (300 mg total) by mouth daily. 08/14/14   [name] D Filbert Schilder, NP  amiodarone (PACERONE) 200 MG  tablet Take 1 tablet (200 mg total) by mouth 2 (two) times daily. 04/08/15   [name] D Filbert Schilder, NP  aspirin EC 81 MG EC tablet Take 1 tablet (81 mg total) by mouth daily. 12/16/13   Aundria Rud, NP  CORLANOR 5 MG TABS tablet TAKE (1/2) TABLET BY MOUTH TWICE DAILY WITH A MEAL. 03/19/15   Dolores Patty, MD  cyclobenzaprine (FLEXERIL) 10 MG tablet Take 10 mg by mouth 3 (three) times daily as needed for muscle spasms.     Historical Provider, MD  digoxin (LANOXIN) 0.125 MG tablet Take 0.5 tablets (0.0625 mg total) by mouth daily. 01/01/15   [name] D Clegg, NP  DULoxetine (CYMBALTA) 30 MG capsule Take 30 mg by mouth daily. 02/24/15   Historical Provider, MD  ferrous sulfate 325 (65 FE) MG tablet Take 325 mg by mouth 3 (three) times daily.    Historical Provider, MD  gabapentin (NEURONTIN) 300 MG capsule Take 600 mg by mouth 4 (four) times daily.    Historical Provider, MD  hydrALAZINE (APRESOLINE) 25 MG tablet Take 1 tablet (25 mg total) by mouth every 8 (eight) hours. 04/08/15   [name] D Clegg, NP  insulin aspart (NOVOLOG) 100 UNIT/ML injection Inject 0-20 Units into the skin 3 (three) times daily with meals. Patient taking differently: Inject 0-20 Units into the skin 3 (three) times daily with meals. Per sliding scale: 120 - 150 = 2 units, 151 - 200 = 4 units, 201 - 250 = 7 units, 251 - 300 = 11 units, 301 - 350 = 15 units, 351 and above = 20 units 06/28/12   Gareth Morgan, MD  insulin detemir (LEVEMIR) 100 UNIT/ML injection Inject 0.58 mLs (58 Units total) into the skin 2 (two) times daily. 04/08/15   [name] D Filbert Schilder, NP  Linaclotide (LINZESS) 290 MCG CAPS capsule Take 1 capsule (290 mcg total) by mouth daily. 30 minutes before breakfast 02/12/15   Nira Retort, NP  magnesium oxide (MAG-OX) 400 (241.3 MG) MG tablet Take 1 tablet (400 mg total) by mouth 2 (two) times daily. 04/15/15   [name] D Filbert Schilder, NP  milrinone (PRIMACOR) 20 MG/100ML SOLN infusion Inject 47.5125 mcg/min into the vein continuous. 04/08/15   [name] D Filbert Schilder, NP   omeprazole (PRILOSEC) 40 MG capsule Take 1 capsule (40 mg total) by mouth daily. 03/27/13   Marinus Maw, MD  oxyCODONE-acetaminophen (PERCOCET) 10-325 MG per tablet Take 0.5-1 tablets by mouth every 4 (four) hours as needed for pain.     Historical Provider, MD  potassium chloride SA (K-DUR,KLOR-CON) 20 MEQ tablet Take 3 tablets (60 mEq total) by mouth 2 (two) times daily. 04/15/15   [name] D Filbert Schilder, NP  ranolazine (RANEXA) 500 MG 12 hr tablet Take 1 tablet (500 mg total) by mouth 2 (two) times daily. 04/08/15   [name] D Filbert Schilder, NP  sildenafil (REVATIO) 20 MG tablet Take 1 tablet (20 mg total) by mouth 3 (three) times daily. 04/08/15   [name] D Filbert Schilder, NP  spironolactone (ALDACTONE) 25 MG tablet Take 1 tablet (25 mg total) by mouth 2 (two) times daily. 01/01/15   [name] D Clegg, NP  temazepam (RESTORIL) 30 MG capsule Take 30 mg by mouth at bedtime.    Historical Provider, MD  torsemide (DEMADEX) 100 MG tablet Take 1 tablet (100 mg total) by mouth 2 (two) times daily. 04/15/15   [name] D Filbert Schilder, NP  vitamin E 400 UNIT capsule Take 400 Units by mouth daily.    Historical Provider, MD   BP 107/63 mmHg  Pulse 73  Temp(Src) 98.4 F (36.9 C) (Oral)  Resp 16  Ht 5\' 5"  (1.651 m)  Wt 274 lb (124.286 kg)  BMI 45.60 kg/m2  SpO2 91%  LMP 01/19/2014 Physical Exam  Constitutional: She appears well-developed and well-nourished. No distress.  HENT:  Head: Normocephalic and atraumatic.  Mouth/Throat: Oropharynx is clear and moist. No oropharyngeal exudate.  Eyes: Conjunctivae and EOM are normal. Pupils are equal, round, and reactive to light. Right eye exhibits no discharge. Left eye exhibits no discharge. No  scleral icterus.  Neck: Normal range of motion. Neck supple. No JVD present. No thyromegaly present.  Cardiovascular: Normal rate, regular rhythm and intact distal pulses.  Exam reveals no gallop and no friction rub.   Murmur heard. Systolic murmur. Distant heart sounds. Occasional ectopy.    Pulmonary/Chest: Effort  normal and breath sounds normal. No respiratory distress. She has no wheezes. She has no rales.  Abdominal: Soft. Bowel sounds are normal. She exhibits no distension and no mass. There is no tenderness.  Musculoskeletal: Normal range of motion. She exhibits edema. She exhibits no tenderness.  Mild symmetrical edema below the knees.  Lymphadenopathy:    She has no cervical adenopathy.  Neurological: She is alert. Coordination normal.  Skin: Skin is warm and dry. No rash noted. No erythema.  Psychiatric: She has a normal mood and affect. Her behavior is normal.  Nursing note and vitals reviewed.   ED Course  Procedures (including critical care time) DIAGNOSTIC STUDIES: Oxygen Saturation is 91% on RA, low by my interpretation.    COORDINATION OF CARE: 12:01 AM Discussed treatment plan with pt at bedside and pt agreed to plan.   Labs Review Labs Reviewed  BASIC METABOLIC PANEL - Abnormal; Notable for the following:    Chloride 97 (*)    Glucose, Bld 103 (*)    BUN 29 (*)    Creatinine, Ser 1.38 (*)    Calcium 8.7 (*)    GFR calc non Af Amer 44 (*)    GFR calc Af Amer 51 (*)    All other components within normal limits  CBC - Abnormal; Notable for the following:    RDW 15.9 (*)    All other components within normal limits  BRAIN NATRIURETIC PEPTIDE - Abnormal; Notable for the following:    B Natriuretic Peptide 159.1 (*)    All other components within normal limits  I-STAT TROPOININ, ED    Imaging Review Dg Chest 2 View  04/15/2015   CLINICAL DATA:  Shortness of breath for 2 days.  EXAM: CHEST  2 VIEW  COMPARISON:  Single view of the chest 04/02/2015. CT chest 03/27/2015.  FINDINGS: There is marked cardiomegaly and vascular congestion. A pacing device and right PICC are in place. No consolidative process, pneumothorax or effusion is identified.  IMPRESSION: Cardiomegaly and pulmonary vascular congestion.  No acute finding.   Electronically Signed   By: Drusilla Kanner M.D.   On:  04/15/2015 20:18     EKG Interpretation   Date/Time:  Thursday April 15 2015 19:44:11 EDT Ventricular Rate:  83 PR Interval:  80 QRS Duration: 162 QT Interval:  480 QTC Calculation: 564 R Axis:   158 Text Interpretation:  baseline paced rhythm Right bundle branch block  Anterolateral infarct , age undetermined T wave abnormality, consider  inferior ischemia frequent pvc's Abnormal ekg Confirmed by Hyacinth Meeker  MD,  Ader Fritze ([id number]) on 04/15/2015 11:58:47 PM      MDM   Final diagnoses:  CHF exacerbation    Discussed care with the cardiologist, Dr. Tresa Endo who will admit the patient to the hospital for diuresis. The patient has increased fluid, increased weight, does not appear to be in distress but is mildly tachypneic. She will need to be admitted to the hospital for ongoing diuresis.  Labs show that the patient has chronic renal insufficiency, no change, no anemia, BNP is only 159, chest x-ray with vascular congestion.  I have personally viewed and interpreted the imaging and agree with radiologist interpretation.  I personally performed the services described in this documentation, which was scribed in my presence. The recorded information has been reviewed and is accurate.      Eber Hong, MD 04/16/15 [id number]

## 2015-04-15 NOTE — ED Notes (Addendum)
Pt states that she was seen at the Heart Failure Clinic this AM due to retaining extra fluid this week. Pt was told to take a Metolazone pill. Pt states that after taking this pill, she developed a headache, and felt like her heart was skipping a beat. Pt is on a milrinone drip.

## 2015-04-15 NOTE — Patient Instructions (Addendum)
INCREASE Magnesium to 400mg  twice daily.  INCREASE Potassium to 60 meq (3 tabs) twice daily.  Take 2.5 mg tablet of metolazone today, then take one tablet every Monday.  Refill sent for Torsemide 100mg  tabs.  All medications sent to [facility].  Do the following things EVERYDAY: 1) Weigh yourself in the morning before breakfast. Write it down and keep it in a log. 2) Take your medicines as prescribed 3) Eat low salt foods-Limit salt (sodium) to 2000 mg per day.  4) Stay as active as you can everyday 5) Limit all fluids for the day to less than 2 liters

## 2015-04-15 NOTE — ED Notes (Signed)
Husband requested that she have some oxygen.  Uses a C Pap at home Sats 89%)  Also requested ice chips - few given. Oxygen at 2 liter via [place] applied

## 2015-04-15 NOTE — Telephone Encounter (Signed)
Huntley Dec RN with [facility], called to let us know that [name] weight was up 9lbs since Monday. Asymptomatic. Some edema in lower extremity. Patient thinks the scales are messed up. HHRN said she was going to order pt the scales from [facility] so they could follow her weight more closely. She is there to get labs on pt. I told her to draw a BNP along with her other labs. She said that she would order them stat so we could hopefully have the labs before appt.

## 2015-04-15 NOTE — Telephone Encounter (Signed)
Patient is a 49 year old female with past medical history of nonischemic cardiomyopathy s/p BiV ICD [employer] officer, historic buildings, recurrent VT, chronic systolic heart failure, hypertension, diabetes, morbid obesity and the obstructive sleep apnea on CPAP. She was in the HF clinic today at which time she appears to be volume overloaded and was instructed to take an additional metolazone.  She contacted the cardiology afterhour's service as she has been having palpitations and feeling of passing out. She is currently on amiodarone for recurrent DVT. I am concerned given her history that she may have recurrent VT is causing her symptoms. I have advised her to seek medical attention and local ED and potentially have her ICD interrogated. She denies any shock from the ICD today.  Ramond Dial PA Pager: [phone]

## 2015-04-15 NOTE — Progress Notes (Signed)
CSW met with patient and SO Tommy. Patient requesting assistance with discussion with her parents about possible LVAD and patient's wishes. CSW discussed at length and provided patient with information/brochures to pass on to her family to assist with understanding of LVAD. CSW contacted Sue Ellen Grounds in Palliative Care about assisting with completion of Advanced Directives. Sue Ellen will complete with patient and will assist with family discussion regarding patient's wishes. CSW continues to follow for support and assessment process for LVAD. Jackie Brennan, LCSW [phone] 

## 2015-04-15 NOTE — ED Notes (Signed)
Miller, MD at bedside.  

## 2015-04-15 NOTE — ED Notes (Addendum)
This RN notified that the pt had a 5 beat run of PVCs. Upon review of the pt's EKG monitor, pt has been having frequent PVCs, and will go into bigeminy for a few beats, and then to sinus rhythm with PVCs. [place], MD notified.

## 2015-04-15 NOTE — Progress Notes (Signed)
Advanced Heart Failure Medication Review by a Pharmacist  Does the patient  feel that his/her medications are working for him/her?  yes  Has the patient been experiencing any side effects to the medications prescribed?  no  Does the patient measure his/her own blood pressure or blood glucose at home?  yes   Does the patient have any problems obtaining medications due to transportation or finances?   no  Understanding of regimen: good Understanding of indications: good Potential of compliance: good    Pharmacist comments:  Pleasant 49 yo F presenting with her husband and up-to-date medication list. She seems to have a good understanding of her regimen and states that she has not missed any doses of her medications. She does note that she uses Percocet usually twice daily for headaches. She also requested that we call in an Rx for the 100 mg tablets of her torsemide instead of the 20 mg tablets. Otherwise, no other medication issues noted.   Tyler Deis. Bonnye Fava, PharmD, BCPS Clinical Pharmacist Pager: [phone] Phone: [phone] 04/15/2015 11:47 AM

## 2015-04-15 NOTE — Progress Notes (Signed)
Patient ID: [name], female   DOB: 1965-10-02, 49 y.o.   MRN: [id number] EP: Dr Lovena Le PCP: Dr Karie Kirks  HPI: [name] is a 75o female with a history of NICM s/p BiV ICD 2007 ([place]), chronic systolic HF, DM2, HTN, recurrent VT, morbid obesity and OSA on CPAP.   Admitted to [facility] 12/08/13 with progressive DOE.and volume overload.  She was transferred from [facility] to [facility] due to acute decompensated HF. ECHO EF 35% by echo but difficult study.  Admitted 08/05/14 after ICD shock for V fib. Also diuresed with IV lasix and transitioned back to torsemide. Also with thyromegaly and had ultrasound with small cysts noted. Discharge weight was 249 pounds.  She had another ICD shock in 12/15 for VT.   She is unable to tolerate Corlanor 5 mg bid but does ok on 2.5 mg bid.  Admitted 7/15 through 7/28 after VF/VT with multiple shocks and cardiogenic shock requiring milrinone. Had [place] with high pulmonary pressures and she was started on sildenafil. Discharged on milrinone 0.375 mcg. Met with LVAD coordinator and cardiac surgery for possible advanced therapies. Discharge weight 263 pounds.   Today she returns for post hospital follow up. Discharged on milrinone 0.375 mcg  Milrinone. Complaining of fatigue.  Denies SOB/PND/Orthopnea. Last night had palpitations with nausea. Nausea persists today. Appetite good. Weight going up from discharge 263- 276 pounds. Taking all medications. Followed by [facility].    RHC 03/31/15 RA = 28 RV = 83/19/30 PA = 86/38 (65) PCW = 36 Fick cardiac output/index = 2.9/1.3 Thermo cardiac output/index = 3.1/1.3 PVR = 10 Arterial sat = 99% PA sat = 45%, 48%  RHC 01/06/15 RA mean 18 RV 66/22 PA 66/34, mean 48 PCWP mean 28 Oxygen saturations: PA 55% AO 93% Cardiac Output (Fick) 3.77  Cardiac Index (Fick) 1.8 PVR 5.3 WU  Cardiac Output (Thermo) 4.77 Cardiac Index (Thermo) 2.26 PVR 4.2 WU  [facility] 12/12/13  RA 38/37 (35)  RV 90/28  PA 95/51 (71)  PCWP 57/73 (54)   LV 127/50  AO 123/66 (100)  Oxygen saturations:  PA 40%  AO 96%  Cardiac Output/Index (Fick) 3.2/1.4, PVR 5.3  Normal coronary anatomy  CPX 6/15 FVC 1.34 (42%)  FEV1 1.11 (43%)  FEV1/FVC 83%  MVV 42 (40%) RPE: 19 Resting HR: 80 Peak HR: 115 (67% age predicted max HR) BP rest: 102/70 BP peak: 118/68 Peak VO2: 9.3 (52.9% predicted peak VO2) VE/VCO2 slope: 29.3 OUES: 1.57 Peak RER: 0.95 Ventilatory Threshold: 7.7 (43.8% predicted peak VO2) Peak RR 44 Peak Ventilation: 32 VE/MVV: 76.2% PETCO2 at peak: 41 O2pulse: 10 (77% predicted O2pulse)  - Echo 12/09/13 EF 35% echo but difficult study.  - Echo (3/16) with EF 30-35%, severe LV dilation with mild LVH, restrictive diastolic function, moderate MR, mildly decreased RV systolic function, PA systolic pressure 60 mmHg. ECHO 04/05/2015: EF 30-35%    ABIs (4/16) were normal.   Labs 11/28/13 Pro BNP 948  Labs 12/23/13 Dig level 0.6, K 4.6,  Creatinine 0.86 , Pro BNP 248 Labs 02/23/14 K 3.2 Cr 0.69 Labs 8/15 K 3.7, creatinine 0.78 Labs 12/15 K 4.5, creatinine 0.98, BNP 172, TSH normal Labs 3/16 TSH normal, LFTs normal, digoxin 0.9 Labs 4/16 K 3.7, creatinine 1.06, BNP 235, HCT 42.8 Laba 12/21/2014: K 4.8 Creatine 1.33 Labs 01/20/2015: K 4.8 Creatinine 1.33  Labs 01/26/15: K 4.2, creatinine 1.21, BNP 224, HCT 41.7 Labs 04/15/15: K 3.8 Creatinine 1.35 Hgb 12.7 Mag 1.9  FH: Father alive with HF  SH: No ETOH and does not smoke, lives in [place] with husband and 28 yr old daughter   ROS: All systems negative except as listed in HPI, PMH and Problem List.  Past Medical History  Diagnosis Date  . CHF (congestive heart failure)     a. EF 30-35%, RV mildly dilated (difficult study 11/2013) b. RHC (12/2013): RA 38/37 (35), RV 90/28, PA 95/51 (71), PCWP 54, PA 40%, AO 96 %, CO/CI Fick 3.2/1.4, PVR 5.3   . Nonischemic cardiomyopathy 12/2013    a. LHC (12/2013) normal coronary anatomy   . Mitral regurgitation     moderate to severe  . LBBB  (left bundle branch block)   . HTN (hypertension)   . Asthma   . IBS (irritable bowel syndrome)     with primarily constipation  . Morbid obesity   . GERD (gastroesophageal reflux disease)   . IDA (iron deficiency anemia)     2o TO SB AVMS, Hx parenteral iron Dr Tressie Stalker  . AVM (arteriovenous malformation)     Small bowel, s/p duble balloon enteroscopy/APC jejunum Dr Arsenio Loader [facility]) 01/23/2011  . Peripheral neuropathy   . OSA (obstructive sleep apnea)     on CPAP qhs  . Diabetes mellitus without complication   . Torsades de pointes     a. appropriate ICD therapy 12/2014 in setting of hypokalemia  . Pneumonia     Current Outpatient Prescriptions  Medication Sig Dispense Refill  . albuterol (PROVENTIL HFA;VENTOLIN HFA) 108 (90 BASE) MCG/ACT inhaler Inhale 2 puffs into the lungs every 6 (six) hours as needed for wheezing or shortness of breath.    . allopurinol (ZYLOPRIM) 300 MG tablet Take 1 tablet (300 mg total) by mouth daily. 30 tablet 6  . amiodarone (PACERONE) 200 MG tablet Take 1 tablet (200 mg total) by mouth 2 (two) times daily. 120 tablet 3  . aspirin EC 81 MG EC tablet Take 1 tablet (81 mg total) by mouth daily. 30 tablet 3  . CORLANOR 5 MG TABS tablet TAKE (1/2) TABLET BY MOUTH TWICE DAILY WITH A MEAL. 60 tablet 2  . cyclobenzaprine (FLEXERIL) 10 MG tablet Take 10 mg by mouth 3 (three) times daily as needed for muscle spasms.     . digoxin (LANOXIN) 0.125 MG tablet Take 0.5 tablets (0.0625 mg total) by mouth daily. 90 tablet 3  . DULoxetine (CYMBALTA) 30 MG capsule Take 30 mg by mouth daily.    . ferrous sulfate 325 (65 FE) MG tablet Take 325 mg by mouth 3 (three) times daily.    Marland Kitchen gabapentin (NEURONTIN) 300 MG capsule Take 600 mg by mouth 4 (four) times daily.    . hydrALAZINE (APRESOLINE) 25 MG tablet Take 1 tablet (25 mg total) by mouth every 8 (eight) hours. 90 tablet 6  . insulin aspart (NOVOLOG) 100 UNIT/ML injection Inject 0-20 Units into the skin 3 (three) times  daily with meals. (Patient taking differently: Inject 0-20 Units into the skin 3 (three) times daily with meals. Per sliding scale: 120 - 150 = 2 units, 151 - 200 = 4 units, 201 - 250 = 7 units, 251 - 300 = 11 units, 301 - 350 = 15 units, 351 and above = 20 units) 1 vial 5  . insulin detemir (LEVEMIR) 100 UNIT/ML injection Inject 0.58 mLs (58 Units total) into the skin 2 (two) times daily. 10 mL 11  . Linaclotide (LINZESS) 290 MCG CAPS capsule Take 1 capsule (290 mcg total) by mouth daily. 30 minutes before  breakfast 30 capsule 5  . magnesium oxide (MAG-OX) 400 (241.3 MG) MG tablet Take 1 tablet (400 mg total) by mouth daily. 30 tablet 6  . metolazone (ZAROXOLYN) 2.5 MG tablet Take 1 tablet (2.5 mg total) by mouth as needed. Take one tablet by mouth 30 minutes before taking morning dose of Torsemide on Tuesdays 10 tablet 3  . milrinone (PRIMACOR) 20 MG/100ML SOLN infusion Inject 47.5125 mcg/min into the vein continuous. 100 mL 6  . omeprazole (PRILOSEC) 40 MG capsule Take 1 capsule (40 mg total) by mouth daily. 30 capsule 6  . oxyCODONE-acetaminophen (PERCOCET) 10-325 MG per tablet Take 0.5-1 tablets by mouth every 4 (four) hours as needed for pain.     . potassium chloride SA (K-DUR,KLOR-CON) 20 MEQ tablet Take 2 tablets (40 mEq total) by mouth 3 (three) times daily. 210 tablet 3  . ranolazine (RANEXA) 500 MG 12 hr tablet Take 1 tablet (500 mg total) by mouth 2 (two) times daily. 60 tablet 6  . sildenafil (REVATIO) 20 MG tablet Take 1 tablet (20 mg total) by mouth 3 (three) times daily. 60 tablet 0  . spironolactone (ALDACTONE) 25 MG tablet Take 1 tablet (25 mg total) by mouth 2 (two) times daily. 60 tablet 3  . temazepam (RESTORIL) 30 MG capsule Take 30 mg by mouth at bedtime.    . torsemide (DEMADEX) 100 MG tablet Take 1 tablet (100 mg total) by mouth 2 (two) times daily. 90 tablet 0  . vitamin E 400 UNIT capsule Take 400 Units by mouth daily.     No current facility-administered medications for  this encounter.    Filed Vitals:   04/15/15 1125  BP: 110/66  Pulse: 80  Weight: 276 lb 12.8 oz (125.556 kg)  SpO2: 96%     PHYSICAL EXAM: General: Obese woman. No resp difficulty. husband present. Ambulated slowly into the clinic.   HEENT: normal Neck: Thick. JVP to jaw  Carotids 2+ bilaterally; no bruits. No lymphadenopathy or thryomegaly appreciated. Cor: PMI normal. Regular rate & rhythm. No rubs, 2/6 HSM LLSB.  Unable to palpate pedal pulses. +S3 Lungs: clear Abdomen: obese, soft, nontender, mildly distended. No hepatosplenomegaly. No bruits or masses. Good bowel sounds. Extremities: no cyanosis, clubbing, rash.  1+ ankle edema bilaterally. RUE PICC Neuro: alert & orientedx3, cranial nerves grossly intact. Moves all 4 extremities w/o difficulty. Affect pleasant.  ASSESSMENT & PLAN:  1. Chronic Systolic Heart Failure: Nonischemic cardiomyopathy, s/p [employer] CRT-D. EF 30-35% (03/2015). Now on Milrinone so CPX not applicable.   NYHA class IIIb symptoms on Milrinone 0.375 mcg.  Volume status elevated. At least 10 pounds from discharge. Continue torsemide 100 mg twice daily and add metolazone 2.5 mg every Thursday. Increase potassium to 60 meq to three times a day.   -  Continue current spironolactone and digoxin.  -No BB with low output.  - Continue ivabradine 2.5 mg twice a day. She had difficulty with 5 mg twice a day.  VAD coordinator met with her while hospitalized.  - Reinforced the need and importance of daily weights, a low sodium diet, and fluid restriction (less than 2 L a day). Instructed to call the HF clinic if weight increases more than 3 lbs overnight or 5 lbs in a week.  2. OSA: Using CPAP.  3. Morbid Obesity: She is going to consider bariatric surgery eventually.  Needs watch portions.  4. VT/VF 03/26/2015  with ICD resuscitation: Continue amiodarone 200 mg twice a day. K today  3.8 Mag  1.8. Need to increase magnesium to 400 mg twice a day. Increase potassium  to 60 meq tid. . Follow TSH Needs at least yearly eye exam.  5. Leg weakness: Difficult to palpate pedal pulses but ABIs normal in 4/16.  6. Dizziness: For now ok.  7. H/O GI bleed with AVM. - no evidence of bleeding.  8. Pulmonary HTN- RHC 03/31/15 PVR 10. Started sildenafil. Needs 6MW next visit. Will need to repeat RHC in 3 months. [place] sildenafil+ milrinone was lower pressures.   Follow up in 2 weeks with 6MW. [employer] to interrogate device at that time.   Saphire Barnhart  NP-C 04/15/2015

## 2015-04-15 NOTE — ED Notes (Signed)
Pt. reports worsening SOB with exertion , swelling/edema at extremities and abdomen with intermittent palpitations onset this week .

## 2015-04-16 DIAGNOSIS — Z7189 Other specified counseling: Secondary | ICD-10-CM | POA: Diagnosis not present

## 2015-04-16 DIAGNOSIS — R570 Cardiogenic shock: Secondary | ICD-10-CM | POA: Diagnosis not present

## 2015-04-16 DIAGNOSIS — Z833 Family history of diabetes mellitus: Secondary | ICD-10-CM | POA: Diagnosis not present

## 2015-04-16 DIAGNOSIS — Z9119 Patient's noncompliance with other medical treatment and regimen: Secondary | ICD-10-CM | POA: Diagnosis present

## 2015-04-16 DIAGNOSIS — I129 Hypertensive chronic kidney disease with stage 1 through stage 4 chronic kidney disease, or unspecified chronic kidney disease: Secondary | ICD-10-CM | POA: Diagnosis present

## 2015-04-16 DIAGNOSIS — I272 Other secondary pulmonary hypertension: Secondary | ICD-10-CM | POA: Diagnosis present

## 2015-04-16 DIAGNOSIS — I5023 Acute on chronic systolic (congestive) heart failure: Principal | ICD-10-CM

## 2015-04-16 DIAGNOSIS — Z6841 Body Mass Index (BMI) 40.0 and over, adult: Secondary | ICD-10-CM | POA: Diagnosis not present

## 2015-04-16 DIAGNOSIS — I472 Ventricular tachycardia: Secondary | ICD-10-CM | POA: Diagnosis not present

## 2015-04-16 DIAGNOSIS — J9610 Chronic respiratory failure, unspecified whether with hypoxia or hypercapnia: Secondary | ICD-10-CM | POA: Diagnosis present

## 2015-04-16 DIAGNOSIS — Z7982 Long term (current) use of aspirin: Secondary | ICD-10-CM | POA: Diagnosis not present

## 2015-04-16 DIAGNOSIS — Z888 Allergy status to other drugs, medicaments and biological substances status: Secondary | ICD-10-CM | POA: Diagnosis not present

## 2015-04-16 DIAGNOSIS — Z9012 Acquired absence of left breast and nipple: Secondary | ICD-10-CM | POA: Diagnosis present

## 2015-04-16 DIAGNOSIS — Z794 Long term (current) use of insulin: Secondary | ICD-10-CM | POA: Diagnosis not present

## 2015-04-16 DIAGNOSIS — I447 Left bundle-branch block, unspecified: Secondary | ICD-10-CM | POA: Diagnosis present

## 2015-04-16 DIAGNOSIS — I509 Heart failure, unspecified: Secondary | ICD-10-CM | POA: Diagnosis present

## 2015-04-16 DIAGNOSIS — Z8249 Family history of ischemic heart disease and other diseases of the circulatory system: Secondary | ICD-10-CM | POA: Diagnosis not present

## 2015-04-16 DIAGNOSIS — Z87891 Personal history of nicotine dependence: Secondary | ICD-10-CM | POA: Diagnosis not present

## 2015-04-16 DIAGNOSIS — N183 Chronic kidney disease, stage 3 (moderate): Secondary | ICD-10-CM

## 2015-04-16 DIAGNOSIS — I340 Nonrheumatic mitral (valve) insufficiency: Secondary | ICD-10-CM | POA: Diagnosis present

## 2015-04-16 DIAGNOSIS — Z79899 Other long term (current) drug therapy: Secondary | ICD-10-CM | POA: Diagnosis not present

## 2015-04-16 DIAGNOSIS — Z79891 Long term (current) use of opiate analgesic: Secondary | ICD-10-CM | POA: Diagnosis not present

## 2015-04-16 DIAGNOSIS — N179 Acute kidney failure, unspecified: Secondary | ICD-10-CM | POA: Diagnosis not present

## 2015-04-16 DIAGNOSIS — K589 Irritable bowel syndrome without diarrhea: Secondary | ICD-10-CM | POA: Diagnosis present

## 2015-04-16 DIAGNOSIS — D509 Iron deficiency anemia, unspecified: Secondary | ICD-10-CM | POA: Diagnosis present

## 2015-04-16 DIAGNOSIS — I493 Ventricular premature depolarization: Secondary | ICD-10-CM | POA: Diagnosis present

## 2015-04-16 DIAGNOSIS — K219 Gastro-esophageal reflux disease without esophagitis: Secondary | ICD-10-CM | POA: Diagnosis present

## 2015-04-16 DIAGNOSIS — Z515 Encounter for palliative care: Secondary | ICD-10-CM | POA: Diagnosis not present

## 2015-04-16 DIAGNOSIS — Z95810 Presence of automatic (implantable) cardiac defibrillator: Secondary | ICD-10-CM | POA: Diagnosis not present

## 2015-04-16 DIAGNOSIS — I270 Primary pulmonary hypertension: Secondary | ICD-10-CM

## 2015-04-16 DIAGNOSIS — E876 Hypokalemia: Secondary | ICD-10-CM | POA: Diagnosis present

## 2015-04-16 DIAGNOSIS — I429 Cardiomyopathy, unspecified: Secondary | ICD-10-CM | POA: Diagnosis present

## 2015-04-16 DIAGNOSIS — E1122 Type 2 diabetes mellitus with diabetic chronic kidney disease: Secondary | ICD-10-CM | POA: Diagnosis present

## 2015-04-16 DIAGNOSIS — G629 Polyneuropathy, unspecified: Secondary | ICD-10-CM | POA: Diagnosis present

## 2015-04-16 DIAGNOSIS — G4733 Obstructive sleep apnea (adult) (pediatric): Secondary | ICD-10-CM | POA: Diagnosis present

## 2015-04-16 LAB — COMPREHENSIVE METABOLIC PANEL
ALT: 35 U/L (ref 14–54)
AST: 37 U/L (ref 15–41)
Albumin: 3.7 g/dL (ref 3.5–5.0)
Alkaline Phosphatase: 77 U/L (ref 38–126)
Anion gap: 14 (ref 5–15)
BUN: 29 mg/dL — ABNORMAL HIGH (ref 6–20)
CALCIUM: 9 mg/dL (ref 8.9–10.3)
CHLORIDE: 96 mmol/L — AB (ref 101–111)
CO2: 29 mmol/L (ref 22–32)
Creatinine, Ser: 1.49 mg/dL — ABNORMAL HIGH (ref 0.44–1.00)
GFR calc Af Amer: 47 mL/min — ABNORMAL LOW (ref >60)
GFR calc non Af Amer: 40 mL/min — ABNORMAL LOW (ref >60)
GLUCOSE: 98 mg/dL (ref 65–99)
POTASSIUM: 3.6 mmol/L (ref 3.5–5.1)
Sodium: 139 mmol/L (ref 135–145)
Total Bilirubin: 0.8 mg/dL (ref 0.3–1.2)
Total Protein: 8 g/dL (ref 6.5–8.1)

## 2015-04-16 LAB — MAGNESIUM: MAGNESIUM: 2 mg/dL (ref 1.7–2.4)

## 2015-04-16 LAB — TROPONIN I
Troponin I: <0.03 ng/mL (ref <0.031)
Troponin I: <0.03 ng/mL (ref <0.031)

## 2015-04-16 LAB — GLUCOSE, CAPILLARY
GLUCOSE-CAPILLARY: 91 mg/dL (ref 65–99)
Glucose-Capillary: 151 mg/dL — ABNORMAL HIGH (ref 65–99)
Glucose-Capillary: 88 mg/dL (ref 65–99)

## 2015-04-16 LAB — CBC
HEMATOCRIT: 41.9 % (ref 36.0–46.0)
HEMOGLOBIN: 13.4 g/dL (ref 12.0–15.0)
MCH: 30.1 pg (ref 26.0–34.0)
MCHC: 32 g/dL (ref 30.0–36.0)
MCV: 94.2 fL (ref 78.0–100.0)
Platelets: 313 10*3/uL (ref 150–400)
RBC: 4.45 MIL/uL (ref 3.87–5.11)
RDW: 15.8 % — ABNORMAL HIGH (ref 11.5–15.5)
WBC: 4.7 10*3/uL (ref 4.0–10.5)

## 2015-04-16 LAB — PROTIME-INR
INR: 1.2 (ref 0.00–1.49)
PROTHROMBIN TIME: 15.4 s — AB (ref 11.6–15.2)

## 2015-04-16 LAB — TSH: TSH: 3.808 u[IU]/mL (ref 0.350–4.500)

## 2015-04-16 LAB — MRSA PCR SCREENING: MRSA BY PCR: NEGATIVE

## 2015-04-16 LAB — BRAIN NATRIURETIC PEPTIDE: B Natriuretic Peptide: 262.1 pg/mL — ABNORMAL HIGH (ref 0.0–100.0)

## 2015-04-16 MED ORDER — OXYCODONE HCL 5 MG PO TABS
5.0000 mg | ORAL_TABLET | ORAL | Status: DC | PRN
  Administered 2015-04-16 – 2015-04-27 (×10): 5 mg via ORAL
  Filled 2015-04-16 (×11): qty 1

## 2015-04-16 MED ORDER — INSULIN ASPART 100 UNIT/ML ~~LOC~~ SOLN
0.0000 [IU] | Freq: Three times a day (TID) | SUBCUTANEOUS | Status: DC
  Administered 2015-04-16: 4 [IU] via SUBCUTANEOUS
  Administered 2015-04-17 – 2015-04-20 (×4): 3 [IU] via SUBCUTANEOUS
  Administered 2015-04-22: 4 [IU] via SUBCUTANEOUS
  Administered 2015-04-22 – 2015-04-23 (×2): 3 [IU] via SUBCUTANEOUS
  Administered 2015-04-27: 4 [IU] via SUBCUTANEOUS
  Administered 2015-04-27 – 2015-04-28 (×2): 3 [IU] via SUBCUTANEOUS

## 2015-04-16 MED ORDER — SODIUM CHLORIDE 0.9 % IV SOLN
250.0000 mL | INTRAVENOUS | Status: DC | PRN
  Administered 2015-04-16 – 2015-04-20 (×2): 250 mL via INTRAVENOUS
  Administered 2015-04-24: 10 mL via INTRAVENOUS
  Administered 2015-04-26: 250 mL via INTRAVENOUS

## 2015-04-16 MED ORDER — ACETAMINOPHEN 500 MG PO TABS
1000.0000 mg | ORAL_TABLET | Freq: Once | ORAL | Status: AC
  Administered 2015-04-16: 1000 mg via ORAL
  Filled 2015-04-16: qty 2

## 2015-04-16 MED ORDER — ALBUTEROL SULFATE HFA 108 (90 BASE) MCG/ACT IN AERS: 2.0000 | INHALATION_SPRAY | Freq: Four times a day (QID) | RESPIRATORY_TRACT | Status: DC | PRN

## 2015-04-16 MED ORDER — LINACLOTIDE 290 MCG PO CAPS
290.0000 ug | ORAL_CAPSULE | Freq: Every day | ORAL | Status: DC
  Administered 2015-04-16 – 2015-04-20 (×5): 290 ug via ORAL
  Filled 2015-04-16 (×5): qty 1

## 2015-04-16 MED ORDER — OXYCODONE-ACETAMINOPHEN 5-325 MG PO TABS
1.0000 | ORAL_TABLET | ORAL | Status: DC | PRN
  Administered 2015-04-16 – 2015-04-27 (×11): 1 via ORAL
  Filled 2015-04-16 (×12): qty 1

## 2015-04-16 MED ORDER — HYDRALAZINE HCL 25 MG PO TABS
25.0000 mg | ORAL_TABLET | Freq: Three times a day (TID) | ORAL | Status: DC
  Administered 2015-04-16 – 2015-04-27 (×33): 25 mg via ORAL
  Filled 2015-04-16 (×39): qty 1

## 2015-04-16 MED ORDER — ALLOPURINOL 300 MG PO TABS
300.0000 mg | ORAL_TABLET | Freq: Every day | ORAL | Status: DC
  Administered 2015-04-16 – 2015-04-28 (×13): 300 mg via ORAL
  Filled 2015-04-16 (×5): qty 1
  Filled 2015-04-16: qty 3
  Filled 2015-04-16 (×8): qty 1

## 2015-04-16 MED ORDER — MAGNESIUM OXIDE 400 (241.3 MG) MG PO TABS
400.0000 mg | ORAL_TABLET | Freq: Two times a day (BID) | ORAL | Status: DC
  Administered 2015-04-16 – 2015-04-28 (×25): 400 mg via ORAL
  Filled 2015-04-16 (×26): qty 1

## 2015-04-16 MED ORDER — DULOXETINE HCL 30 MG PO CPEP
30.0000 mg | ORAL_CAPSULE | Freq: Every day | ORAL | Status: DC
  Administered 2015-04-16 – 2015-04-28 (×13): 30 mg via ORAL
  Filled 2015-04-16 (×13): qty 1

## 2015-04-16 MED ORDER — OXYCODONE-ACETAMINOPHEN 10-325 MG PO TABS: 1.0000 | ORAL_TABLET | ORAL | Status: DC | PRN

## 2015-04-16 MED ORDER — SODIUM CHLORIDE 0.9 % IJ SOLN
3.0000 mL | Freq: Two times a day (BID) | INTRAMUSCULAR | Status: DC
  Administered 2015-04-16 – 2015-04-28 (×17): 3 mL via INTRAVENOUS

## 2015-04-16 MED ORDER — TEMAZEPAM 15 MG PO CAPS
30.0000 mg | ORAL_CAPSULE | Freq: Every day | ORAL | Status: DC
  Administered 2015-04-16 – 2015-04-27 (×12): 30 mg via ORAL
  Filled 2015-04-16 (×4): qty 2
  Filled 2015-04-16: qty 4
  Filled 2015-04-16 (×7): qty 2

## 2015-04-16 MED ORDER — SODIUM CHLORIDE 0.9 % IJ SOLN: 3.0000 mL | INTRAMUSCULAR | Status: DC | PRN

## 2015-04-16 MED ORDER — ALBUTEROL SULFATE (2.5 MG/3ML) 0.083% IN NEBU: 2.5000 mg | INHALATION_SOLUTION | Freq: Four times a day (QID) | RESPIRATORY_TRACT | Status: DC | PRN

## 2015-04-16 MED ORDER — INSULIN ASPART 100 UNIT/ML ~~LOC~~ SOLN: 0.0000 [IU] | Freq: Three times a day (TID) | SUBCUTANEOUS | Status: DC

## 2015-04-16 MED ORDER — INSULIN DETEMIR 100 UNIT/ML ~~LOC~~ SOLN
58.0000 [IU] | Freq: Two times a day (BID) | SUBCUTANEOUS | Status: DC
  Administered 2015-04-16 – 2015-04-28 (×23): 58 [IU] via SUBCUTANEOUS
  Filled 2015-04-16 (×29): qty 0.58

## 2015-04-16 MED ORDER — DIGOXIN 0.0625 MG HALF TABLET
0.0625 mg | ORAL_TABLET | Freq: Every day | ORAL | Status: DC
  Administered 2015-04-16 – 2015-04-28 (×13): 0.0625 mg via ORAL
  Filled 2015-04-16 (×13): qty 1

## 2015-04-16 MED ORDER — POTASSIUM CHLORIDE CRYS ER 20 MEQ PO TBCR
60.0000 meq | EXTENDED_RELEASE_TABLET | Freq: Two times a day (BID) | ORAL | Status: DC
  Administered 2015-04-16 – 2015-04-28 (×26): 60 meq via ORAL
  Filled 2015-04-16 (×28): qty 3

## 2015-04-16 MED ORDER — FUROSEMIDE 10 MG/ML IJ SOLN
100.0000 mg | Freq: Two times a day (BID) | INTRAVENOUS | Status: DC
  Administered 2015-04-16 – 2015-04-20 (×9): 100 mg via INTRAVENOUS
  Filled 2015-04-16 (×13): qty 10

## 2015-04-16 MED ORDER — ONDANSETRON HCL 4 MG/2ML IJ SOLN
4.0000 mg | Freq: Four times a day (QID) | INTRAMUSCULAR | Status: DC | PRN
  Administered 2015-04-18 – 2015-04-27 (×3): 4 mg via INTRAVENOUS
  Filled 2015-04-16 (×3): qty 2

## 2015-04-16 MED ORDER — PANTOPRAZOLE SODIUM 40 MG PO TBEC
40.0000 mg | DELAYED_RELEASE_TABLET | Freq: Every day | ORAL | Status: DC
  Administered 2015-04-16 – 2015-04-20 (×5): 40 mg via ORAL
  Filled 2015-04-16 (×5): qty 1

## 2015-04-16 MED ORDER — ASPIRIN EC 81 MG PO TBEC
81.0000 mg | DELAYED_RELEASE_TABLET | Freq: Every day | ORAL | Status: DC
  Administered 2015-04-16 – 2015-04-26 (×11): 81 mg via ORAL
  Filled 2015-04-16 (×13): qty 1

## 2015-04-16 MED ORDER — FERROUS SULFATE 325 (65 FE) MG PO TABS
325.0000 mg | ORAL_TABLET | Freq: Three times a day (TID) | ORAL | Status: DC
  Administered 2015-04-16 – 2015-04-28 (×36): 325 mg via ORAL
  Filled 2015-04-16 (×44): qty 1

## 2015-04-16 MED ORDER — RANOLAZINE ER 500 MG PO TB12
500.0000 mg | ORAL_TABLET | Freq: Two times a day (BID) | ORAL | Status: DC
  Administered 2015-04-16 – 2015-04-28 (×25): 500 mg via ORAL
  Filled 2015-04-16 (×26): qty 1

## 2015-04-16 MED ORDER — GABAPENTIN 300 MG PO CAPS
600.0000 mg | ORAL_CAPSULE | Freq: Four times a day (QID) | ORAL | Status: DC
  Administered 2015-04-16 – 2015-04-22 (×24): 600 mg via ORAL
  Filled 2015-04-16 (×30): qty 2

## 2015-04-16 MED ORDER — ENOXAPARIN SODIUM 40 MG/0.4ML ~~LOC~~ SOLN
40.0000 mg | SUBCUTANEOUS | Status: DC
  Filled 2015-04-16 (×2): qty 0.4

## 2015-04-16 MED ORDER — ENOXAPARIN SODIUM 60 MG/0.6ML ~~LOC~~ SOLN
60.0000 mg | SUBCUTANEOUS | Status: DC
  Administered 2015-04-16 – 2015-04-28 (×13): 60 mg via SUBCUTANEOUS
  Filled 2015-04-16 (×13): qty 0.6

## 2015-04-16 MED ORDER — SILDENAFIL CITRATE 20 MG PO TABS
20.0000 mg | ORAL_TABLET | Freq: Three times a day (TID) | ORAL | Status: DC
  Administered 2015-04-16 – 2015-04-25 (×30): 20 mg via ORAL
  Filled 2015-04-16 (×34): qty 1

## 2015-04-16 MED ORDER — CYCLOBENZAPRINE HCL 10 MG PO TABS
10.0000 mg | ORAL_TABLET | Freq: Three times a day (TID) | ORAL | Status: DC | PRN
  Administered 2015-04-16 – 2015-04-21 (×3): 10 mg via ORAL
  Filled 2015-04-16 (×3): qty 1

## 2015-04-16 MED ORDER — ACETAMINOPHEN 325 MG PO TABS
650.0000 mg | ORAL_TABLET | ORAL | Status: DC | PRN
  Administered 2015-04-16 – 2015-04-24 (×3): 650 mg via ORAL
  Filled 2015-04-16 (×3): qty 2

## 2015-04-16 MED ORDER — SPIRONOLACTONE 25 MG PO TABS
25.0000 mg | ORAL_TABLET | Freq: Two times a day (BID) | ORAL | Status: DC
  Administered 2015-04-16 – 2015-04-28 (×24): 25 mg via ORAL
  Filled 2015-04-16 (×31): qty 1

## 2015-04-16 MED ORDER — MILRINONE IN DEXTROSE 20 MG/100ML IV SOLN
0.3750 ug/kg/min | INTRAVENOUS | Status: DC
  Administered 2015-04-16 – 2015-04-28 (×31): 0.375 ug/kg/min via INTRAVENOUS
  Filled 2015-04-16 (×36): qty 100

## 2015-04-16 MED ORDER — MAGNESIUM SULFATE 2 GM/50ML IV SOLN
2.0000 g | Freq: Once | INTRAVENOUS | Status: AC
  Administered 2015-04-16: 2 g via INTRAVENOUS
  Filled 2015-04-16 (×2): qty 50

## 2015-04-16 MED ORDER — AMIODARONE HCL 200 MG PO TABS
200.0000 mg | ORAL_TABLET | Freq: Two times a day (BID) | ORAL | Status: DC
  Administered 2015-04-16 – 2015-04-28 (×25): 200 mg via ORAL
  Filled 2015-04-16 (×27): qty 1

## 2015-04-16 MED ORDER — IVABRADINE HCL 5 MG PO TABS
2.5000 mg | ORAL_TABLET | Freq: Two times a day (BID) | ORAL | Status: DC
  Administered 2015-04-16 – 2015-04-28 (×25): 2.5 mg via ORAL
  Filled 2015-04-16 (×31): qty 1

## 2015-04-16 MED ORDER — POTASSIUM CHLORIDE CRYS ER 20 MEQ PO TBCR
40.0000 meq | EXTENDED_RELEASE_TABLET | Freq: Once | ORAL | Status: AC
  Administered 2015-04-16: 40 meq via ORAL
  Filled 2015-04-16: qty 2

## 2015-04-16 NOTE — ED Notes (Signed)
Attempted report 

## 2015-04-16 NOTE — Progress Notes (Signed)
Pt has orders for sliding scale insulin coverage but no parameters.  I have tried, in vain, numerous times to page a provider to rectify this situation.  I called Dr. Kathi Ludwig office and spoke to [employer] (nurse) who stated that Dr. Clarise Cruz was out of town but she would pass a message along.  She also gave me the pager number of the on-call provider:  [phone]  I paged this number to obtain suitable insulin orders.  Awaiting response.

## 2015-04-16 NOTE — Progress Notes (Signed)
[facility]  Patient Status: Active pt with [facility] prior to this readmission  [facility] is providing the following services: [facility], Home Infusion Pharmacy Team for home inotropes.  [facility] hospital team will follow [name] while here and support her DC when deemed appropriate.   If patient discharges after hours, please call [phone].   [name] 04/16/2015, 9:15 AM

## 2015-04-16 NOTE — H&P (Signed)
[name] is an 49 y.o. female.    Chief Complaint: breathlessness Primary Cardiologist: Dr. Daleen Squibb HPI: [name] is a 49 yo woman with morbidity obesity, T2DM, hypertension, NICM with CRT-D (BS), OSA on CPAP with multiple recent admissions most recently 7/15- 7/28 with VT/VF with multiple ICD shocks and cardiogenic shock requiring milrinone with discharge home on milrinone at dry weight 263 lbs. She was seen in clinic today and weight up to 276 lbs and she has increased dyspnea, nausea and weight gain. She was prescribed a metalazone but it didn't really start her urine output until after getting to the hospital. She also noted some palpitations.   RHC 03/31/15 RA = 28 RV = 83/19/30 PA = 86/38 (65) PCW = 36 Fick cardiac output/index = 2.9/1.3 Thermo cardiac output/index = 3.1/1.3 PVR = 10 Arterial sat = 99% PA sat = 45%, 48%  Past Medical History  Diagnosis Date  . CHF (congestive heart failure)     a. EF 30-35%, RV mildly dilated (difficult study 11/2013) b. RHC (12/2013): RA 38/37 (35), RV 90/28, PA 95/51 (71), PCWP 54, PA 40%, AO 96 %, CO/CI Fick 3.2/1.4, PVR 5.3   . Nonischemic cardiomyopathy 12/2013    a. LHC (12/2013) normal coronary anatomy   . Mitral regurgitation     moderate to severe  . LBBB (left bundle branch block)   . HTN (hypertension)   . Asthma   . IBS (irritable bowel syndrome)     with primarily constipation  . Morbid obesity   . GERD (gastroesophageal reflux disease)   . IDA (iron deficiency anemia)     2o TO SB AVMS, Hx parenteral iron Dr Tressie Stalker  . AVM (arteriovenous malformation)     Small bowel, s/p duble balloon enteroscopy/APC jejunum Dr Arsenio Loader [facility]) 01/23/2011  . Peripheral neuropathy   . OSA (obstructive sleep apnea)     on CPAP qhs  . Diabetes mellitus without complication   . Torsades de pointes     a. appropriate ICD therapy 12/2014 in setting of hypokalemia  . Pneumonia     Past Surgical History  Procedure Laterality  Date  . Appendectomy    . Cholecystectomy      biliary dyskinesia  . Mastectomy  2003    left, partial  . Knee arthroscopy  2005    left  . Small bowel enteroscopy  MAY 2012 [facility] DR. GILLIAM    SB AVMS s/p APC  . Colonoscopy  OCT 2010/SEP 2011    tortuous colon, 3 polyps-benign(2010),   . Upper gastrointestinal endoscopy  '08, '10, SEP 2011    mild antral gastritis (2010), negative SB bx (2010), incomlpete Schatzki's ring (2011)  . Small bowel enteroscopy  SEP 2011 [facility]    Fields-NO AVMS  . Givens capsule study  NOV 2010     NO AVMS, normal  . Irrigation and debridement abscess  06/25/2012    Procedure: MINOR INCISION AND DRAINAGE OF ABSCESS;  Surgeon: Scherry Ran, MD;  Location: [facility] ORS;  Service: General;  Laterality: [name];  Incision & Drainage of Infected Sebaceous Cyst on Chest  . Breast surgery    . Implantable cardioverter defibrillator generator change Left 02/15/2012    BSX CRTD   . Left and right heart catheterization with coronary/graft angiogram  12/12/2013    Procedure: LEFT AND RIGHT HEART CATHETERIZATION WITH Beatrix Fetters;  Surgeon: Peter M Martinique, MD;  Location: [facility] CATH LAB;  Service: Cardiovascular;;  . Right heart catheterization [name] 01/06/2015  Procedure: RIGHT HEART CATH;  Surgeon: Larey Dresser, MD;  Location: [facility] CATH LAB;  Service: Cardiovascular;  Laterality: [name];  . Esophagogastroduodenoscopy  Sept 2013    [facility]: diminutive duodenal polyp, polyp in gastric cardia, path with benign duodenal mucosa with focally prominent Brunner's glands. Fundic gland polyps.   . Cardiac catheterization [name] 03/31/2015    Procedure: Right Heart Cath;  Surgeon: Jolaine Artist, MD;  Location: [place] CV LAB;  Service: Cardiovascular;  Laterality: [name];    Family History  Problem Relation Age of Onset  . Colon cancer Paternal Uncle   . Cervical cancer Mother   . Hypertension Mother   . Cancer Mother   . Heart disease Father   . Hypertension  Father   . GER disease Father   . Heart attack Father   . Bleeding Disorder Father   . Diabetes Sister   . Heart disease Sister   . Hypertension Sister    Social History:  reports that she has quit smoking. Her smoking use included Cigarettes. She smoked 0.00 packs per day for 0 years. She has quit using smokeless tobacco. She reports that she does not drink alcohol or use illicit drugs.  Allergies:  Allergies  Allergen Reactions  . Cymbalta [Duloxetine Hcl] Other (See Comments)    Only in a mild dose pt can tolerate  . Trazodone And Nefazodone Other (See Comments)    Nightmares   . Lyrica [Pregabalin] Swelling     (Not in a hospital admission)  Results for orders placed or performed during the hospital encounter of 04/15/15 (from the past 48 hour(s))  Basic metabolic panel     Status: Abnormal   Collection Time: 04/15/15  7:53 PM  Result Value Ref Range   Sodium 136 135 - 145 mmol/L   Potassium 4.0 3.5 - 5.1 mmol/L   Chloride 97 (L) 101 - 111 mmol/L   CO2 25 22 - 32 mmol/L   Glucose, Bld 103 (H) 65 - 99 mg/dL   BUN 29 (H) 6 - 20 mg/dL   Creatinine, Ser 1.38 (H) 0.44 - 1.00 mg/dL   Calcium 8.7 (L) 8.9 - 10.3 mg/dL   GFR calc non Af Amer 44 (L) >60 mL/min   GFR calc Af Amer 51 (L) >60 mL/min    Comment: (NOTE) The eGFR has been calculated using the CKD EPI equation. This calculation has not been validated in all clinical situations. eGFR's persistently <60 mL/min signify possible Chronic Kidney Disease.    Anion gap 14 5 - 15  CBC     Status: Abnormal   Collection Time: 04/15/15  7:53 PM  Result Value Ref Range   WBC 5.2 4.0 - 10.5 K/uL   RBC 4.40 3.87 - 5.11 MIL/uL   Hemoglobin 13.4 12.0 - 15.0 g/dL   HCT 41.8 36.0 - 46.0 %   MCV 95.0 78.0 - 100.0 fL   MCH 30.5 26.0 - 34.0 pg   MCHC 32.1 30.0 - 36.0 g/dL   RDW 15.9 (H) 11.5 - 15.5 %   Platelets 280 150 - 400 K/uL  Brain natriuretic peptide     Status: Abnormal   Collection Time: 04/15/15  7:53 PM  Result  Value Ref Range   B Natriuretic Peptide 159.1 (H) 0.0 - 100.0 pg/mL  I-stat troponin, ED     Status: None   Collection Time: 04/15/15  8:05 PM  Result Value Ref Range   Troponin i, poc 0.01 0.00 - 0.08 ng/mL   Comment  3            Comment: Due to the release kinetics of cTnI, a negative result within the first hours of the onset of symptoms does not rule out myocardial infarction with certainty. If myocardial infarction is still suspected, repeat the test at appropriate intervals.    Dg Chest 2 View  04/15/2015   CLINICAL DATA:  Shortness of breath for 2 days.  EXAM: CHEST  2 VIEW  COMPARISON:  Single view of the chest 04/02/2015. CT chest 03/27/2015.  FINDINGS: There is marked cardiomegaly and vascular congestion. A pacing device and right PICC are in place. No consolidative process, pneumothorax or effusion is identified.  IMPRESSION: Cardiomegaly and pulmonary vascular congestion.  No acute finding.   Electronically Signed   By: Inge Rise M.D.   On: 04/15/2015 20:18    Review of Systems  Constitutional: Positive for malaise/fatigue. Negative for fever and diaphoresis.  HENT: Negative for ear discharge and ear pain.   Eyes: Negative for double vision and photophobia.  Respiratory: Positive for shortness of breath. Negative for cough.   Cardiovascular: Positive for palpitations and orthopnea. Negative for chest pain.  Gastrointestinal: Positive for heartburn and nausea. Negative for abdominal pain and diarrhea.  Genitourinary: Negative for dysuria and urgency.  Musculoskeletal: Negative for myalgias and neck pain.  Neurological: Positive for weakness. Negative for tingling, tremors, sensory change and headaches.  Endo/Heme/Allergies: Negative for polydipsia.  Psychiatric/Behavioral: Negative for suicidal ideas, hallucinations and substance abuse.    Blood pressure 98/55, pulse 70, temperature 98.4 F (36.9 C), temperature source Oral, resp. rate 34, height 5' 5"  (1.651 m),  weight 124.286 kg (274 lb), last menstrual period 01/19/2014, SpO2 99 %. Physical Exam  Nursing note and vitals reviewed. Constitutional: She is oriented to person, place, and time. She appears well-developed and well-nourished. She appears distressed.  HENT:  Head: Normocephalic and atraumatic.  Nose: Nose normal.  Mouth/Throat: Oropharynx is clear and moist. No oropharyngeal exudate.  Eyes: Conjunctivae and EOM are normal. Pupils are equal, round, and reactive to light. No scleral icterus.  Neck: Normal range of motion. Neck supple. JVD present.  JVP to 2 cm below mandible at 60 degrees  Cardiovascular: Normal rate, regular rhythm and intact distal pulses.   Murmur heard. Regular rhythm with frequent ectopy; systolic murmur at LSB  Respiratory: Effort normal and breath sounds normal. No respiratory distress. She has no wheezes. She has no rales.  GI: Soft. Bowel sounds are normal. She exhibits no distension. There is no tenderness.  Musculoskeletal: Normal range of motion. She exhibits edema.  Trace edema bilaterally  Neurological: She is alert and oriented to person, place, and time. No cranial nerve deficit. Coordination normal.  Skin: Skin is warm and dry. No rash noted. She is not diaphoretic. No erythema.  Psychiatric: She has a normal mood and affect. Thought content normal.    Labs reviewed; cr 1.4, BUN 29, glucose 103, na 136, K 4.0, h/h 13/42, plt 180 EKG: biventricular paced, intermittent PVCs  Assessment/Plan Ms. Brubacher is a 49 yo woman with acute on chronic systolic heart failure NYHA Class IIIB who has gained 13 lbs and feels worse. She needs diuresis as metolazone didn't quite jumpstart her. Will start IV diuresis now.  Problem List 1. Acute on chronic systolic heart failure 2. Pulmonary hypertension 3. OSA on CPAP 4. Chronic Kidney Disease stage III 5. Nausea 6. T2DM on insulin 7. Prior VT/VF  Plan 1. Admit to HF service 2. 100 mg IV  lasix bid first dose now 3.  Continue milrinone 0.375 mcg/kg/min 4. Continue digoxin 0.0625 mg, sildenafil 20 mg tid, spironolactone 25 mg daily, continue ivabradine 2.5 mg bid 5. No bb given low output 6. Continue CPAP for OSA 7. Continue home insulin 8. Wrote for 60 meq KCL bid - if UOP doesn't keep up careful with potassium 9. Continue amiodarone 200 mg bid   Mikaia Janvier 04/16/2015, 1:37 AM

## 2015-04-16 NOTE — Care Management Note (Signed)
Case Management Note  Patient Details  Name: [name] MRN: [id number] Date of Birth: 1966/01/05  Subjective/Objective:      Adm w heart failure              Action/Plan: lives w fam, pcp dr Katharine Look   Expected Discharge Date:                  Expected Discharge Plan:  Home w Home Health Services  In-House Referral:     Discharge planning Services  CM Consult  Post Acute Care Choice:  Resumption of Svcs/PTA Provider Choice offered to:     DME Arranged:  IV pump/equipment DME Agency:  [facility]  HH Arranged:  RN, Disease Management HH Agency:  [facility]  Status of Service:     Medicare Important Message Given:    Date Medicare IM Given:    Medicare IM give by:    Date Additional Medicare IM Given:    Additional Medicare Important Message give by:     If discussed at Long Length of Stay Meetings, dates discussed:    Additional Comments: ur review done  Hanley Hays, RN 04/16/2015, 8:07 AM

## 2015-04-16 NOTE — Progress Notes (Addendum)
Advanced Heart Failure Rounding Note   Subjective:    Readmitted with increased dyspnea and volume overload. Started IV lasix. Weight down 1 pound.   Creatinine 1.49   Objective:   Weight Range:  Vital Signs:   Temp:  [98.4 F (36.9 C)-98.5 F (36.9 C)] 98.5 F (36.9 C) (08/05 0500) Pulse Rate:  [39-79] 71 (08/05 0700) Resp:  [15-34] 17 (08/05 0700) BP: (95-142)/(40-79) 108/66 mmHg (08/05 0700) SpO2:  [91 %-100 %] 99 % (08/05 0700) Weight:  [273 lb 5.9 oz (124 kg)-274 lb (124.286 kg)] 273 lb 5.9 oz (124 kg) (08/05 0321)    Weight change: Filed Weights   04/15/15 1940 04/16/15 0321  Weight: 274 lb (124.286 kg) 273 lb 5.9 oz (124 kg)    Intake/Output:   Intake/Output Summary (Last 24 hours) at 04/16/15 0759 Last data filed at 04/16/15 0728  Gross per 24 hour  Intake      0 ml  Output    750 ml  Net   -750 ml     Physical Exam: General:  Well appearing. No resp difficulty HEENT: normal Neck: Thick, JVP 12. Carotids 2+ bilat; no bruits. No lymphadenopathy or thryomegaly appreciated. Cor: PMI nondisplaced. Regular rate & rhythm. No rubs, gallops or murmurs. Lungs: clear Abdomen: soft, nontender, nondistended. No hepatosplenomegaly. No bruits or masses. Good bowel sounds. Extremities: no cyanosis, clubbing, rash.  1+ edema 1/2 to knees bilaterally.  Neuro: alert & orientedx3, cranial nerves grossly intact. moves all 4 extremities w/o difficulty. Affect pleasant  Telemetry: sinus with BiV pacing.   Labs: Basic Metabolic Panel:  Recent Labs Lab 04/15/15 0840 04/15/15 1953 04/16/15 0355  NA 136 136 139  K 3.8 4.0 3.6  CL 98* 97* 96*  CO2 GLUCOSE 116* 103* 98  BUN 34* 29* 29*  CREATININE 1.35* 1.38* 1.49*  CALCIUM 8.6* 8.7* 9.0  MG 1.9  --  2.0    Liver Function Tests:  Recent Labs Lab 04/16/15 0355  AST 37  ALT 35  ALKPHOS 77  BILITOT 0.8  PROT 8.0  ALBUMIN 3.7   No results for input(s): LIPASE, AMYLASE in the last 168 hours. No  results for input(s): AMMONIA in the last 168 hours.  CBC:  Recent Labs Lab 04/15/15 0840 04/15/15 1953 04/16/15 0355  WBC 4.7 5.2 4.7  NEUTROABS 2.6  --   --   HGB 12.7 13.4 13.4  HCT 40.4 41.8 41.9  MCV 96.9 95.0 94.2  PLT 339 280 313    Cardiac Enzymes:  Recent Labs Lab 04/16/15 0355  TROPONINI <0.03    BNP: BNP (last 3 results)  Recent Labs  04/15/15 0840 04/15/15 1953 04/16/15 0355  BNP 172.0* 159.1* 262.1*    ProBNP (last 3 results)  Recent Labs  08/04/14 2339 08/21/14 1117  PROBNP 1727.0* 171.9*      Other results:  Imaging: Dg Chest 2 View  04/15/2015   CLINICAL DATA:  Shortness of breath for 2 days.  EXAM: CHEST  2 VIEW  COMPARISON:  Single view of the chest 04/02/2015. CT chest 03/27/2015.  FINDINGS: There is marked cardiomegaly and vascular congestion. A pacing device and right PICC are in place. No consolidative process, pneumothorax or effusion is identified.  IMPRESSION: Cardiomegaly and pulmonary vascular congestion.  No acute finding.   Electronically Signed   By: Drusilla Kanner M.D.   On: 04/15/2015 20:18      Medications:     Scheduled Medications: . allopurinol  300 mg Oral  Daily  . amiodarone  200 mg Oral BID  . aspirin EC  81 mg Oral Daily  . digoxin  0.0625 mg Oral Daily  . DULoxetine  30 mg Oral Daily  . enoxaparin (LOVENOX) injection  40 mg Subcutaneous Q24H  . ferrous sulfate  325 mg Oral TID WC  . furosemide  100 mg Intravenous BID  . gabapentin  600 mg Oral QID  . hydrALAZINE  25 mg Oral 3 times per day  . insulin aspart  0-20 Units Subcutaneous TID WC  . insulin detemir  58 Units Subcutaneous BID  . ivabradine  2.5 mg Oral BID WC  . Linaclotide  290 mcg Oral Daily  . magnesium oxide  400 mg Oral BID  . pantoprazole  40 mg Oral Daily  . potassium chloride SA  60 mEq Oral BID  . ranolazine  500 mg Oral BID  . sildenafil  20 mg Oral TID  . sodium chloride  3 mL Intravenous Q12H  . spironolactone  25 mg Oral BID   . temazepam  30 mg Oral QHS     Infusions: . milrinone       PRN Medications:  sodium chloride, acetaminophen, albuterol, cyclobenzaprine, ondansetron (ZOFRAN) IV, oxyCODONE-acetaminophen **AND** oxyCODONE, sodium chloride   Assessment/Plan   Readmitted with volume overload.  1. Acute/Chronic Systolic Heart Failure: Nonischemic cardiomyopathy, s/p [employer] CRT-D. EF 30-35% (03/2015). Have been considering LVAD in future.  Now on Milrinone so CPX not applicable. NYHA class IIIb symptoms on Milrinone 0.375 mcg. Remains volume overloaded.  - Follow CVP and co-ox.  - Continue IV lasix 100 mg twice daily. -Continue current spironolactone, hydralazine, and digoxin. Check digoxin level.  - No BB with low output.  - Continue ivabradine 2.5 mg twice a day. She had difficulty with 5 mg twice a day.  2. OSA: Using CPAP.  3. Morbid Obesity: She is going to consider bariatric surgery eventually. Needs watch portions.  4. 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 Mag 1.8 =>Mag increased to 400 mg bid and will give 2 grams mag now.  Replace K.  5. Leg weakness: Difficult to palpate pedal pulses but ABIs normal in 4/16.  6. Dizziness: For now ok.  7. H/O GI bleed with AVM.  No evidence of acute bleeding.  8. Pulmonary HTN: RHC 03/31/15 PVR 10. Started sildenafil. Needs next visit. Will need to repeat RHC in 3 months. Hopefull sildenafil+ milrinone will lower PA pressure.  9. CKD: Follow creatinine carefully with diuresis.   Length of Stay: 0  CLEGG,[name]  NP-C   04/16/2015, 7:59 AM  Advanced Heart Failure Team Pager [phone] (M-F; 7a - 4p)  Please contact [facility] for night-coverage after hours (4p -7a ) and weekends on [email or web address]  Patient seen with NP, agree with the above note.  Re-admitted with volume overload and dyspnea despite use of torsemide 100 mg bid at home.  She remains volume overloaded this morning, starting to  diurese with Lasix 100 mg IV bid.  - Continue IV Lasix and home milrinone - Follow CVP and co-ox.  - Replace K, has history of VT.   [name] 04/16/2015 8:30 AM

## 2015-04-17 LAB — BASIC METABOLIC PANEL
Anion gap: 8 (ref 5–15)
BUN: 32 mg/dL — ABNORMAL HIGH (ref 6–20)
CO2: 34 mmol/L — ABNORMAL HIGH (ref 22–32)
CREATININE: 1.58 mg/dL — AB (ref 0.44–1.00)
Calcium: 8.8 mg/dL — ABNORMAL LOW (ref 8.9–10.3)
Chloride: 97 mmol/L — ABNORMAL LOW (ref 101–111)
GFR calc Af Amer: 43 mL/min — ABNORMAL LOW (ref >60)
GFR calc non Af Amer: 37 mL/min — ABNORMAL LOW (ref >60)
GLUCOSE: 107 mg/dL — AB (ref 65–99)
Potassium: 4.4 mmol/L (ref 3.5–5.1)
Sodium: 139 mmol/L (ref 135–145)

## 2015-04-17 LAB — CARBOXYHEMOGLOBIN
Carboxyhemoglobin: 1.2 % (ref 0.5–1.5)
Methemoglobin: 0.8 % (ref 0.0–1.5)
O2 SAT: 83.5 %
Total hemoglobin: 13.4 g/dL (ref 12.0–16.0)

## 2015-04-17 LAB — GLUCOSE, CAPILLARY
GLUCOSE-CAPILLARY: 106 mg/dL — AB (ref 65–99)
GLUCOSE-CAPILLARY: 144 mg/dL — AB (ref 65–99)
GLUCOSE-CAPILLARY: 59 mg/dL — AB (ref 65–99)
GLUCOSE-CAPILLARY: 105 mg/dL — AB (ref 65–99)
Glucose-Capillary: 89 mg/dL (ref 65–99)
Glucose-Capillary: 89 mg/dL (ref 65–99)

## 2015-04-17 LAB — CBC
HEMATOCRIT: 39.6 % (ref 36.0–46.0)
Hemoglobin: 12.6 g/dL (ref 12.0–15.0)
MCH: 30.2 pg (ref 26.0–34.0)
MCHC: 31.8 g/dL (ref 30.0–36.0)
MCV: 95 fL (ref 78.0–100.0)
PLATELETS: 306 10*3/uL (ref 150–400)
RBC: 4.17 MIL/uL (ref 3.87–5.11)
RDW: 15.8 % — ABNORMAL HIGH (ref 11.5–15.5)
WBC: 4.1 10*3/uL (ref 4.0–10.5)

## 2015-04-17 LAB — MAGNESIUM: MAGNESIUM: 2 mg/dL (ref 1.7–2.4)

## 2015-04-17 LAB — DIGOXIN LEVEL: DIGOXIN LVL: 0.5 ng/mL — AB (ref 0.8–2.0)

## 2015-04-17 MED ORDER — DEXTROSE 50 % IV SOLN
INTRAVENOUS | Status: AC
  Administered 2015-04-17: 50 mL
  Filled 2015-04-17: qty 50

## 2015-04-17 NOTE — Progress Notes (Addendum)
Patient ID: [name], female   DOB: Nov 26, 1965, 49 y.o.   MRN: [id number] Advanced Heart Failure Rounding Note   Subjective:    Readmitted with increased dyspnea and volume overload. Started IV lasix.  She has been diuresing well. Weights probably not accurate, think done in bed. Co-ox 83%, CVP 14.  Creatinine 1.49 => 1.58  Objective:   Weight Range:  Vital Signs:   Temp:  [97.5 F (36.4 C)-98.3 F (36.8 C)] 97.7 F (36.5 C) (08/06 1200) Pulse Rate:  [40-80] 70 (08/06 1200) Resp:  [13-22] 16 (08/06 1200) BP: (91-129)/(30-67) 100/58 mmHg (08/06 1200) SpO2:  [92 %-100 %] 98 % (08/06 1200) Weight:  [275 lb 5.7 oz (124.9 kg)] 275 lb 5.7 oz (124.9 kg) (08/06 0500) Last BM Date: 04/14/15  Weight change: Filed Weights   04/15/15 1940 04/16/15 0321 04/17/15 0500  Weight: 274 lb (124.286 kg) 273 lb 5.9 oz (124 kg) 275 lb 5.7 oz (124.9 kg)    Intake/Output:   Intake/Output Summary (Last 24 hours) at 04/17/15 1341 Last data filed at 04/17/15 0942  Gross per 24 hour  Intake 295.33 ml  Output   1650 ml  Net -1354.67 ml     Physical Exam: General:  Well appearing. No resp difficulty HEENT: normal Neck: Thick, JVP 10-12. Carotids 2+ bilat; no bruits. No lymphadenopathy or thryomegaly appreciated. Cor: PMI nondisplaced. Regular rate & rhythm. No rubs, gallops or murmurs. Lungs: clear Abdomen: soft, nontender, nondistended. No hepatosplenomegaly. No bruits or masses. Good bowel sounds. Extremities: no cyanosis, clubbing, rash.  1+ ankle edema.  Neuro: alert & orientedx3, cranial nerves grossly intact. moves all 4 extremities w/o difficulty. Affect pleasant  Telemetry: sinus with BiV pacing.   Labs: Basic Metabolic Panel:  Recent Labs Lab 04/15/15 0840 04/15/15 1953 04/16/15 0355 04/17/15 0548  NA 136 136 139 139  K 3.8 4.0 3.6 4.4  CL 98* 97* 96* 97*  CO2 31 25 29  34*  GLUCOSE 116* 103* 98 107*  BUN 34* 29* 29* 32*  CREATININE 1.35* 1.38* 1.49* 1.58*  CALCIUM  8.6* 8.7* 9.0 8.8*  MG 1.9  --  2.0 2.0    Liver Function Tests:  Recent Labs Lab 04/16/15 0355  AST 37  ALT 35  ALKPHOS 77  BILITOT 0.8  PROT 8.0  ALBUMIN 3.7   No results for input(s): LIPASE, AMYLASE in the last 168 hours. No results for input(s): AMMONIA in the last 168 hours.  CBC:  Recent Labs Lab 04/15/15 0840 04/15/15 1953 04/16/15 0355 04/17/15 0548  WBC 4.7 5.2 4.7 4.1  NEUTROABS 2.6  --   --   --   HGB 12.7 13.4 13.4 12.6  HCT 40.4 41.8 41.9 39.6  MCV 96.9 95.0 94.2 95.0  PLT 339 280 313 306    Cardiac Enzymes:  Recent Labs Lab 04/16/15 0355 04/16/15 1030 04/16/15 1540  TROPONINI <0.03 <0.03 <0.03    BNP: BNP (last 3 results)  Recent Labs  04/15/15 0840 04/15/15 1953 04/16/15 0355  BNP 172.0* 159.1* 262.1*    ProBNP (last 3 results)  Recent Labs  08/04/14 2339 08/21/14 1117  PROBNP 1727.0* 171.9*      Other results:  Imaging: Dg Chest 2 View  04/15/2015   CLINICAL DATA:  Shortness of breath for 2 days.  EXAM: CHEST  2 VIEW  COMPARISON:  Single view of the chest 04/02/2015. CT chest 03/27/2015.  FINDINGS: There is marked cardiomegaly and vascular congestion. A pacing device and right PICC are in place.  No consolidative process, pneumothorax or effusion is identified.  IMPRESSION: Cardiomegaly and pulmonary vascular congestion.  No acute finding.   Electronically Signed   By: Drusilla Kanner M.D.   On: 04/15/2015 20:18     Medications:     Scheduled Medications: . allopurinol  300 mg Oral Daily  . amiodarone  200 mg Oral BID  . aspirin EC  81 mg Oral Daily  . digoxin  0.0625 mg Oral Daily  . DULoxetine  30 mg Oral Daily  . enoxaparin (LOVENOX) injection  60 mg Subcutaneous Q24H  . ferrous sulfate  325 mg Oral TID WC  . furosemide  100 mg Intravenous BID  . gabapentin  600 mg Oral QID  . hydrALAZINE  25 mg Oral 3 times per day  . insulin aspart  0-20 Units Subcutaneous TID WC  . insulin detemir  58 Units Subcutaneous  BID  . ivabradine  2.5 mg Oral BID WC  . Linaclotide  290 mcg Oral Daily  . magnesium oxide  400 mg Oral BID  . pantoprazole  40 mg Oral Daily  . potassium chloride SA  60 mEq Oral BID  . ranolazine  500 mg Oral BID  . sildenafil  20 mg Oral TID  . sodium chloride  3 mL Intravenous Q12H  . spironolactone  25 mg Oral BID  . temazepam  30 mg Oral QHS    Infusions: . milrinone 0.375 mcg/kg/min (04/16/15 1940)    PRN Medications: sodium chloride, acetaminophen, albuterol, cyclobenzaprine, ondansetron (ZOFRAN) IV, oxyCODONE-acetaminophen **AND** oxyCODONE, sodium chloride   Assessment/Plan   Readmitted with volume overload.  1. Acute/Chronic Systolic Heart Failure: Nonischemic cardiomyopathy, s/p [employer] CRT-D. EF 30-35% (03/2015). Have been considering LVAD in future.  Now on Milrinone so CPX not applicable. NYHA class IIIb symptoms on Milrinone 0.375 mcg, co-ox adequate. Remains volume overloaded, CVP 10-12.  - Continue IV lasix 100 mg twice daily. -Continue current spironolactone, hydralazine, and digoxin. Digoxin level ok.  - No BB with low output.  - Continue ivabradine 2.5 mg twice a day. She had difficulty with 5 mg twice a day.  2. OSA: Using CPAP.  3. Morbid Obesity: She is going to consider bariatric surgery eventually. Needs watch portions.  4. 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 Mag 1.8 =>Mag increased to 400 mg bid and will give 2 grams mag now.  Replace K.  5. Leg weakness: Difficult to palpate pedal pulses but ABIs normal in 4/16.  6. Dizziness: For now ok.  7. H/O GI bleed with AVM.  No evidence of acute bleeding.  8. Pulmonary HTN: RHC 03/31/15 PVR 10. Started sildenafil. Needs next visit. Will need to repeat RHC in 3 months. Hopefull sildenafil+ milrinone will lower PA pressure.  9. CKD: Follow creatinine carefully with diuresis, stable so far.   Length of Stay: 1  [name]    04/17/2015, 1:41  PM  Advanced Heart Failure Team Pager [phone] (M-F; 7a - 4p)  Please contact [facility] for night-coverage after hours (4p -7a ) and weekends on [email or web address]

## 2015-04-18 LAB — BASIC METABOLIC PANEL
Anion gap: 9 (ref 5–15)
BUN: 31 mg/dL — ABNORMAL HIGH (ref 6–20)
CO2: 35 mmol/L — AB (ref 22–32)
Calcium: 8.9 mg/dL (ref 8.9–10.3)
Chloride: 93 mmol/L — ABNORMAL LOW (ref 101–111)
Creatinine, Ser: 1.53 mg/dL — ABNORMAL HIGH (ref 0.44–1.00)
GFR calc Af Amer: 45 mL/min — ABNORMAL LOW (ref >60)
GFR, EST NON AFRICAN AMERICAN: 39 mL/min — AB (ref >60)
GLUCOSE: 113 mg/dL — AB (ref 65–99)
POTASSIUM: 4.3 mmol/L (ref 3.5–5.1)
SODIUM: 137 mmol/L (ref 135–145)

## 2015-04-18 LAB — GLUCOSE, CAPILLARY
GLUCOSE-CAPILLARY: 72 mg/dL (ref 65–99)
Glucose-Capillary: 86 mg/dL (ref 65–99)
Glucose-Capillary: 80 mg/dL (ref 65–99)
Glucose-Capillary: 98 mg/dL (ref 65–99)

## 2015-04-18 LAB — CARBOXYHEMOGLOBIN
Carboxyhemoglobin: 1.2 % (ref 0.5–1.5)
Methemoglobin: 0.9 % (ref 0.0–1.5)
O2 SAT: 84.5 %
Total hemoglobin: 13.4 g/dL (ref 12.0–16.0)

## 2015-04-18 NOTE — Progress Notes (Signed)
Patient ID: [name], female   DOB: 1965/11/12, 49 y.o.   MRN: [id number] Advanced Heart Failure Rounding Note   Subjective:    Remains on milrinone. Diuresing well. Weight down 4 pounds. Breathing improved. Co-ox consistently 84% (?). Creatinine stable at 1.5. CVP still 16-18    Objective:   Weight Range:  Vital Signs:   Temp:  [97.6 F (36.4 C)-98.7 F (37.1 C)] 97.6 F (36.4 C) (08/07 1154) Pulse Rate:  [40-75] 70 (08/07 1154) Resp:  [14-30] 14 (08/07 1154) BP: (90-134)/(45-83) 117/64 mmHg (08/07 1154) SpO2:  [97 %-100 %] 98 % (08/07 1154) Weight:  [123.2 kg (271 lb 9.7 oz)] 123.2 kg (271 lb 9.7 oz) (08/07 0342) Last BM Date: 04/16/15  Weight change: Filed Weights   04/16/15 0321 04/17/15 0500 04/18/15 0342  Weight: 124 kg (273 lb 5.9 oz) 124.9 kg (275 lb 5.7 oz) 123.2 kg (271 lb 9.7 oz)    Intake/Output:   Intake/Output Summary (Last 24 hours) at 04/18/15 1242 Last data filed at 04/18/15 1100  Gross per 24 hour  Intake 1429.1 ml  Output   2075 ml  Net -645.9 ml     Physical Exam: General:  Obese female lying in bed. No resp difficulty HEENT: normal Neck: Thick, JVP hard to see. Carotids 2+ bilat; no bruits. No lymphadenopathy or thryomegaly appreciated. Cor: PMI nondisplaced. Regular rate & rhythm. No rubs, gallops or murmurs. Lungs: clear Abdomen: soft, nontender, +distended. No hepatosplenomegaly. No bruits or masses. Good bowel sounds. Extremities: no cyanosis, clubbing, rash.  1+ ankle edema.  Neuro: alert & orientedx3, cranial nerves grossly intact. moves all 4 extremities w/o difficulty. Affect pleasant  Telemetry: sinus with BiV pacing.   Labs: Basic Metabolic Panel:  Recent Labs Lab 04/15/15 0840 04/15/15 1953 04/16/15 0355 04/17/15 0548 04/18/15 0340  NA 136 136 139 139 137  K 3.8 4.0 3.6 4.4 4.3  CL 98* 97* 96* 97* 93*  CO2 31 25 29  34* 35*  GLUCOSE 116* 103* 98 107* 113*  BUN 34* 29* 29* 32* 31*  CREATININE 1.35* 1.38* 1.49*  1.58* 1.53*  CALCIUM 8.6* 8.7* 9.0 8.8* 8.9  MG 1.9  --  2.0 2.0  --     Liver Function Tests:  Recent Labs Lab 04/16/15 0355  AST 37  ALT 35  ALKPHOS 77  BILITOT 0.8  PROT 8.0  ALBUMIN 3.7   No results for input(s): LIPASE, AMYLASE in the last 168 hours. No results for input(s): AMMONIA in the last 168 hours.  CBC:  Recent Labs Lab 04/15/15 0840 04/15/15 1953 04/16/15 0355 04/17/15 0548  WBC 4.7 5.2 4.7 4.1  NEUTROABS 2.6  --   --   --   HGB 12.7 13.4 13.4 12.6  HCT 40.4 41.8 41.9 39.6  MCV 96.9 95.0 94.2 95.0  PLT 339 280 313 306    Cardiac Enzymes:  Recent Labs Lab 04/16/15 0355 04/16/15 1030 04/16/15 1540  TROPONINI <0.03 <0.03 <0.03    BNP: BNP (last 3 results)  Recent Labs  04/15/15 0840 04/15/15 1953 04/16/15 0355  BNP 172.0* 159.1* 262.1*    ProBNP (last 3 results)  Recent Labs  08/04/14 2339 08/21/14 1117  PROBNP 1727.0* 171.9*      Other results:  Imaging: No results found.   Medications:     Scheduled Medications: . allopurinol  300 mg Oral Daily  . amiodarone  200 mg Oral BID  . aspirin EC  81 mg Oral Daily  . digoxin  0.0625 mg  Oral Daily  . DULoxetine  30 mg Oral Daily  . enoxaparin (LOVENOX) injection  60 mg Subcutaneous Q24H  . ferrous sulfate  325 mg Oral TID WC  . furosemide  100 mg Intravenous BID  . gabapentin  600 mg Oral QID  . hydrALAZINE  25 mg Oral 3 times per day  . insulin aspart  0-20 Units Subcutaneous TID WC  . insulin detemir  58 Units Subcutaneous BID  . ivabradine  2.5 mg Oral BID WC  . Linaclotide  290 mcg Oral Daily  . magnesium oxide  400 mg Oral BID  . pantoprazole  40 mg Oral Daily  . potassium chloride SA  60 mEq Oral BID  . ranolazine  500 mg Oral BID  . sildenafil  20 mg Oral TID  . sodium chloride  3 mL Intravenous Q12H  . spironolactone  25 mg Oral BID  . temazepam  30 mg Oral QHS    Infusions: . milrinone 0.375 mcg/kg/min (04/18/15 1100)    PRN Medications: sodium  chloride, acetaminophen, albuterol, cyclobenzaprine, ondansetron (ZOFRAN) IV, oxyCODONE-acetaminophen **AND** oxyCODONE, sodium chloride   Assessment/Plan    1. Acute/Chronic Systolic Heart Failure: Nonischemic cardiomyopathy, s/p [employer] CRT-D. EF 30-35% (03/2015). NYHA class IIIb symptoms on Milrinone 0.375 mcg, co-ox adequate. Remains volume overloaded, CVP 16-18  - Continue IV lasix 100 mg twice daily. -Continue current spironolactone, hydralazine, and digoxin. Digoxin level ok.  - No BB with low output.  - Continue ivabradine 2.5 mg twice a day. She had difficulty with 5 mg twice a day.  2. OSA: Using CPAP.  3. 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 Mag 1.8 =>Mag increased to 400 mg bid. Watch K and Mg closely.  4. H/O GI bleed with AVM.  No evidence of acute bleeding.  5. Pulmonary HTN: RHC 03/31/15 PVR 10. Started sildenafil. Needs next visit. Will need to repeat RHC in 3 months. Hopefull sildenafil+ milrinone will lower PA pressure.  6. CKD: Follow creatinine carefully with diuresis, stable so far.   Volume status improving with diuresis but still volume overloaded. Renal function stable. Watch electrolytes closely. Given size and comorbidities, I do not think she will be a good VAD candidate at this point.   Length of Stay: 2  [name]   MD  04/18/2015, 12:42 PM  Advanced Heart Failure Team Pager [phone] (M-F; 7a - 4p)  Please contact [facility] for night-coverage after hours (4p -7a ) and weekends on [email or web address]

## 2015-04-19 LAB — MAGNESIUM: Magnesium: 2.1 mg/dL (ref 1.7–2.4)

## 2015-04-19 LAB — BASIC METABOLIC PANEL
Anion gap: 9 (ref 5–15)
BUN: 29 mg/dL — AB (ref 6–20)
CALCIUM: 8.8 mg/dL — AB (ref 8.9–10.3)
CHLORIDE: 92 mmol/L — AB (ref 101–111)
CO2: 36 mmol/L — ABNORMAL HIGH (ref 22–32)
CREATININE: 1.37 mg/dL — AB (ref 0.44–1.00)
GFR calc Af Amer: 52 mL/min — ABNORMAL LOW (ref >60)
GFR calc non Af Amer: 44 mL/min — ABNORMAL LOW (ref >60)
GLUCOSE: 92 mg/dL (ref 65–99)
Potassium: 4.1 mmol/L (ref 3.5–5.1)
SODIUM: 137 mmol/L (ref 135–145)

## 2015-04-19 LAB — GLUCOSE, CAPILLARY
Glucose-Capillary: 119 mg/dL — ABNORMAL HIGH (ref 65–99)
Glucose-Capillary: 139 mg/dL — ABNORMAL HIGH (ref 65–99)
Glucose-Capillary: 136 mg/dL — ABNORMAL HIGH (ref 65–99)
Glucose-Capillary: 148 mg/dL — ABNORMAL HIGH (ref 65–99)

## 2015-04-19 MED ORDER — METOLAZONE 5 MG PO TABS
5.0000 mg | ORAL_TABLET | Freq: Every day | ORAL | Status: DC
  Administered 2015-04-19 – 2015-04-25 (×7): 5 mg via ORAL
  Filled 2015-04-19 (×9): qty 1

## 2015-04-19 NOTE — Consult Note (Signed)
[name] requested to meet with me regarding the advanced care planning discussions we had on her recent previous admission.  [name] (significant partner) was at bedside and participated in the discussion.  [name] recalled the events since her discharge home on 7/28.  She had been doing well and feeling somewhat better, able to walk a further distance without significant increase in SOB or weakness.  [name] noticed that her wait was starting to increase by 2-3 # per day, she was at the HF clinic on 8/4 and subsequently later that evening she  Began to feel increasingly weak and c/o palpitations and presented at [facility] ED.  [name] verbalized that she would like to complete and have notarized the AD/HCPOA that we started at her previous admission. We discussed the importance to move forward in making plans related to her wishes.  Although [name] is optimistic at this point, she agrees that verbalizing her faith as well as her fears is a productive part of caring for herself and her family during this time.  Advanced care planning paperwork will be completed and notarized tomorrow after [name] is able to meet with and discuss her wishes with her family.  Will f/up in AM with paperwork.  Cindra Presume, RN-BC, MSN, [facility] Palliative Care

## 2015-04-19 NOTE — Progress Notes (Signed)
Utilization review complete. Andreah Goheen RN CCM Case Mgmt phone [phone] 

## 2015-04-19 NOTE — Progress Notes (Signed)
Patient ID: [name], female   DOB: 05/14/1966, 49 y.o.   MRN: [id number] Advanced Heart Failure Rounding Note   Subjective:    Remains on milrinone. Continues to diurese with 0.4 L out yesterday. Weight down 1 pound. Breathing improved. Co-ox consistently 84% (?). Creatinine stable at 1.37. CVP 16-17.    Objective:   Weight Range:  Vital Signs:   Temp:  [97.6 F (36.4 C)-98.1 F (36.7 C)] 97.8 F (36.6 C) (08/08 0737) Pulse Rate:  [69-75] 70 (08/08 0737) Resp:  [14-24] 15 (08/08 0737) BP: (88-139)/(30-81) 114/61 mmHg (08/08 0737) SpO2:  [96 %-100 %] 99 % (08/08 0737) Weight:  [270 lb 8.1 oz (122.7 kg)] 270 lb 8.1 oz (122.7 kg) (08/08 0539) Last BM Date: 04/16/15  Weight change: Filed Weights   04/17/15 0500 04/18/15 0342 04/19/15 0539  Weight: 275 lb 5.7 oz (124.9 kg) 271 lb 9.7 oz (123.2 kg) 270 lb 8.1 oz (122.7 kg)    Intake/Output:   Intake/Output Summary (Last 24 hours) at 04/19/15 0740 Last data filed at 04/19/15 0600  Gross per 24 hour  Intake 1088.1 ml  Output   1475 ml  Net -386.9 ml     Physical Exam: General:  Obese female lying in bed. No resp difficulty HEENT: normal Neck: Thick, JVP hard to see. Carotids 2+ bilat; no bruits. No lymphadenopathy or thryomegaly appreciated. Cor: PMI nondisplaced. Regular rate & rhythm. No rubs, gallops or murmurs. Lungs: clear Abdomen: soft, nontender, +distended. No hepatosplenomegaly. No bruits or masses. Good bowel sounds. Extremities: no cyanosis, clubbing, rash.  1+ ankle edema.  Neuro: alert & orientedx3, cranial nerves grossly intact. moves all 4 extremities w/o difficulty. Affect pleasant  Telemetry: Sinus with BiV pacing 70s  Labs: Basic Metabolic Panel:  Recent Labs Lab 04/15/15 0840 04/15/15 1953 04/16/15 0355 04/17/15 0548 04/18/15 0340 04/19/15 0400  NA 136 136 139 139 137 137  K 3.8 4.0 3.6 4.4 4.3 4.1  CL 98* 97* 96* 97* 93* 92*  CO2 31 25 29  34* 35* 36*  GLUCOSE 116* 103* 98 107* 113*  92  BUN 34* 29* 29* 32* 31* 29*  CREATININE 1.35* 1.38* 1.49* 1.58* 1.53* 1.37*  CALCIUM 8.6* 8.7* 9.0 8.8* 8.9 8.8*  MG 1.9  --  2.0 2.0  --   --     Liver Function Tests:  Recent Labs Lab 04/16/15 0355  AST 37  ALT 35  ALKPHOS 77  BILITOT 0.8  PROT 8.0  ALBUMIN 3.7   No results for input(s): LIPASE, AMYLASE in the last 168 hours. No results for input(s): AMMONIA in the last 168 hours.  CBC:  Recent Labs Lab 04/15/15 0840 04/15/15 1953 04/16/15 0355 04/17/15 0548  WBC 4.7 5.2 4.7 4.1  NEUTROABS 2.6  --   --   --   HGB 12.7 13.4 13.4 12.6  HCT 40.4 41.8 41.9 39.6  MCV 96.9 95.0 94.2 95.0  PLT 339 280 313 306    Cardiac Enzymes:  Recent Labs Lab 04/16/15 0355 04/16/15 1030 04/16/15 1540  TROPONINI <0.03 <0.03 <0.03    BNP: BNP (last 3 results)  Recent Labs  04/15/15 0840 04/15/15 1953 04/16/15 0355  BNP 172.0* 159.1* 262.1*    ProBNP (last 3 results)  Recent Labs  08/04/14 2339 08/21/14 1117  PROBNP 1727.0* 171.9*      Other results:  Imaging: No results found.   Medications:     Scheduled Medications: . allopurinol  300 mg Oral Daily  . amiodarone  200  mg Oral BID  . aspirin EC  81 mg Oral Daily  . digoxin  0.0625 mg Oral Daily  . DULoxetine  30 mg Oral Daily  . enoxaparin (LOVENOX) injection  60 mg Subcutaneous Q24H  . ferrous sulfate  325 mg Oral TID WC  . furosemide  100 mg Intravenous BID  . gabapentin  600 mg Oral QID  . hydrALAZINE  25 mg Oral 3 times per day  . insulin aspart  0-20 Units Subcutaneous TID WC  . insulin detemir  58 Units Subcutaneous BID  . ivabradine  2.5 mg Oral BID WC  . Linaclotide  290 mcg Oral Daily  . magnesium oxide  400 mg Oral BID  . pantoprazole  40 mg Oral Daily  . potassium chloride SA  60 mEq Oral BID  . ranolazine  500 mg Oral BID  . sildenafil  20 mg Oral TID  . sodium chloride  3 mL Intravenous Q12H  . spironolactone  25 mg Oral BID  . temazepam  30 mg Oral QHS     Infusions: . milrinone 0.375 mcg/kg/min (04/19/15 0540)    PRN Medications: sodium chloride, acetaminophen, albuterol, cyclobenzaprine, ondansetron (ZOFRAN) IV, oxyCODONE-acetaminophen **AND** oxyCODONE, sodium chloride   Assessment/Plan    1. Acute/Chronic Systolic Heart Failure: Nonischemic cardiomyopathy, s/p [employer] CRT-D. EF 30-35% (03/2015). NYHA class IIIb symptoms on Milrinone 0.375 mcg, co-ox adequate. Remains volume overloaded, CVP 16-17 - Continue IV lasix 100 mg twice daily. -Continue current spironolactone, hydralazine, and digoxin. Digoxin level ok.  - No BB with low output.  - Continue ivabradine 2.5 mg twice a day. She had difficulty with 5 mg twice a day.  2. OSA: Using CPAP.  3. 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 Mag 1.8 =>Mag Continue to 400 mg bid. Watch K and Mg closely.  4. H/O GI bleed with AVM.  No evidence of acute bleeding.  5. Pulmonary HTN: RHC 03/31/15 PVR 10. Started sildenafil.  6. CKD: Follow creatinine carefully with diuresis, stable so far.   Volume status improving very slowly with diuresis but still volume overloaded. Renal function slightly improved. Watch electrolytes closely, will check Mg today. Given size and comorbidities, likely not a good VAD candidate.   Length of Stay: 3  [name]  PA-C 04/19/2015, 7:40 AM  Advanced Heart Failure Team Pager [phone] (M-F; 7a - 4p)  Please contact [facility] for night-coverage after hours (4p -7a ) and weekends on [email or web address]  Patient seen and examined with [name], PA-C. We discussed all aspects of the encounter. I agree with the assessment and plan as stated above.   She is diuresing sluggishly on high-dose lasix. CVP still elevated and about 7-8 pounds from previous baseline weight of 263 pounds. Will add metolazone. Baseline RHC last month with PVR 10 and RA/PCW ratio of 0.77 (pre-milrinone). This would preclude VAD.  Will continue to try and diurese. Will likely need RHC prior to discharge to assess whether VAD is a possibility or not.   Mailynn Everly,MD 8:55 AM

## 2015-04-19 NOTE — Care Management Important Message (Signed)
Important Message  Patient Details  Name: [name] MRN: [id number] Date of Birth: [birth year]   Medicare Important Message Given:       [name] 04/19/2015, 1:58 PM

## 2015-04-20 ENCOUNTER — Inpatient Hospital Stay (HOSPITAL_COMMUNITY): Payer: Medicare Other

## 2015-04-20 ENCOUNTER — Encounter (HOSPITAL_COMMUNITY): Payer: Medicare Other

## 2015-04-20 LAB — SPIROMETRY WITH GRAPH
FEF 25-75 Post: 2.19 L/sec
FEF 25-75 Pre: 1.15 L/sec
FEF2575-%Change-Post: 91 %
FEF2575-%Pred-Post: 86 %
FEF2575-%Pred-Pre: 45 %
FEV1-%CHANGE-POST: 18 %
FEV1-%PRED-POST: 52 %
FEV1-%PRED-PRE: 44 %
FEV1-POST: 1.28 L
FEV1-Pre: 1.08 L
FEV1FVC-%Change-Post: -3 %
FEV1FVC-%Pred-Pre: 105 %
FEV6-%CHANGE-POST: 23 %
FEV6-%PRED-PRE: 42 %
FEV6-%Pred-Post: 52 %
FEV6-PRE: 1.26 L
FEV6-Post: 1.54 L
FEV6FVC-%PRED-PRE: 103 %
FEV6FVC-%Pred-Post: 103 %
FVC-%Change-Post: 23 %
FVC-%Pred-Post: 51 %
FVC-%Pred-Pre: 41 %
FVC-Post: 1.55 L
FVC-Pre: 1.26 L
POST FEV6/FVC RATIO: 100 %
Post FEV1/FVC ratio: 83 %
Pre FEV1/FVC ratio: 86 %
Pre FEV6/FVC Ratio: 100 %

## 2015-04-20 LAB — GLUCOSE, CAPILLARY
GLUCOSE-CAPILLARY: 130 mg/dL — AB (ref 65–99)
Glucose-Capillary: 88 mg/dL (ref 65–99)
Glucose-Capillary: 112 mg/dL — ABNORMAL HIGH (ref 65–99)
Glucose-Capillary: 109 mg/dL — ABNORMAL HIGH (ref 65–99)
Glucose-Capillary: 100 mg/dL — ABNORMAL HIGH (ref 65–99)

## 2015-04-20 LAB — BASIC METABOLIC PANEL
Anion gap: 8 (ref 5–15)
BUN: 30 mg/dL — AB (ref 6–20)
CHLORIDE: 93 mmol/L — AB (ref 101–111)
CO2: 35 mmol/L — ABNORMAL HIGH (ref 22–32)
Calcium: 8.7 mg/dL — ABNORMAL LOW (ref 8.9–10.3)
Creatinine, Ser: 1.41 mg/dL — ABNORMAL HIGH (ref 0.44–1.00)
GFR calc Af Amer: 50 mL/min — ABNORMAL LOW (ref >60)
GFR, EST NON AFRICAN AMERICAN: 43 mL/min — AB (ref >60)
GLUCOSE: 123 mg/dL — AB (ref 65–99)
POTASSIUM: 4.2 mmol/L (ref 3.5–5.1)
Sodium: 136 mmol/L (ref 135–145)

## 2015-04-20 LAB — CARBOXYHEMOGLOBIN
CARBOXYHEMOGLOBIN: 1.6 % — AB (ref 0.5–1.5)
METHEMOGLOBIN: 1.3 % (ref 0.0–1.5)
O2 Saturation: 77.6 %
Total hemoglobin: 13 g/dL (ref 12.0–16.0)

## 2015-04-20 MED ORDER — LINACLOTIDE 290 MCG PO CAPS
290.0000 ug | ORAL_CAPSULE | Freq: Every day | ORAL | Status: DC
  Administered 2015-04-21 – 2015-04-28 (×8): 290 ug via ORAL
  Filled 2015-04-20 (×10): qty 1

## 2015-04-20 MED ORDER — FUROSEMIDE 10 MG/ML IJ SOLN
20.0000 mg | Freq: Once | INTRAMUSCULAR | Status: AC
  Administered 2015-04-20: 20 mg via INTRAVENOUS

## 2015-04-20 MED ORDER — BISACODYL 10 MG RE SUPP
10.0000 mg | Freq: Every day | RECTAL | Status: DC | PRN
  Administered 2015-04-22: 10 mg via RECTAL
  Filled 2015-04-20: qty 1

## 2015-04-20 MED ORDER — FUROSEMIDE 10 MG/ML IJ SOLN
INTRAMUSCULAR | Status: AC
Start: 2015-04-20 — End: 2015-04-20
  Filled 2015-04-20: qty 2

## 2015-04-20 MED ORDER — DOCUSATE SODIUM 100 MG PO CAPS
100.0000 mg | ORAL_CAPSULE | Freq: Two times a day (BID) | ORAL | Status: DC | PRN
  Administered 2015-04-22: 100 mg via ORAL
  Filled 2015-04-20: qty 1

## 2015-04-20 MED ORDER — FUROSEMIDE 10 MG/ML IJ SOLN
120.0000 mg | Freq: Three times a day (TID) | INTRAVENOUS | Status: DC
  Administered 2015-04-20 – 2015-04-22 (×6): 120 mg via INTRAVENOUS
  Filled 2015-04-20 (×8): qty 12

## 2015-04-20 MED ORDER — ALBUTEROL SULFATE (2.5 MG/3ML) 0.083% IN NEBU
2.5000 mg | INHALATION_SOLUTION | Freq: Once | RESPIRATORY_TRACT | Status: AC
  Administered 2015-04-20: 2.5 mg via RESPIRATORY_TRACT

## 2015-04-20 MED ORDER — PANTOPRAZOLE SODIUM 40 MG PO TBEC
40.0000 mg | DELAYED_RELEASE_TABLET | Freq: Every day | ORAL | Status: DC
  Administered 2015-04-21 – 2015-04-28 (×8): 40 mg via ORAL
  Filled 2015-04-20 (×8): qty 1

## 2015-04-20 NOTE — Progress Notes (Signed)
Patient ID: [name], female   DOB: 18-Aug-1966, 49 y.o.   MRN: [id number] Advanced Heart Failure Rounding Note   Subjective:    Remains on milrinone. Continues to diurese with 1.6 L out yesterday. Weight stable, still up from baseline of 263. Breathing improved.  CVP 11-12. Coox 77%  Cr Stable.   Objective:   Weight Range:  Vital Signs:   Temp:  [97.5 F (36.4 C)-98.2 F (36.8 C)] 98.2 F (36.8 C) (08/09 0320) Pulse Rate:  [70-95] 73 (08/09 0400) Resp:  [12-26] 15 (08/09 0400) BP: (100-127)/(42-82) 120/79 mmHg (08/09 0400) SpO2:  [96 %-100 %] 100 % (08/09 0400) Weight:  [270 lb 1 oz (122.5 kg)] 270 lb 1 oz (122.5 kg) (08/09 0320) Last BM Date: 04/16/15  Weight change: Filed Weights   04/18/15 0342 04/19/15 0539 04/20/15 0320  Weight: 271 lb 9.7 oz (123.2 kg) 270 lb 8.1 oz (122.7 kg) 270 lb 1 oz (122.5 kg)    Intake/Output:   Intake/Output Summary (Last 24 hours) at 04/20/15 0755 Last data filed at 04/19/15 2156  Gross per 24 hour  Intake    148 ml  Output   1800 ml  Net  -1652 ml     Physical Exam: General:  Obese female lying in bed. No resp difficulty HEENT: normal Neck: Thick, JVP hard to see. Carotids 2+ bilat; no bruits. No lymphadenopathy or thryomegaly appreciated. Cor: PMI nondisplaced. Regular rate & rhythm. No rubs, gallops or murmurs. Lungs: clear Abdomen: soft, nontender, +distended. No hepatosplenomegaly. No bruits or masses. Good bowel sounds. Extremities: no cyanosis, clubbing, rash.  1+ ankle edema.  Neuro: alert & orientedx3, cranial nerves grossly intact. moves all 4 extremities w/o difficulty. Affect pleasant  Telemetry: Sinus with BiV pacing 70-80s. Occasionl PVCs  Labs: Basic Metabolic Panel:  Recent Labs Lab 04/15/15 0840 04/15/15 1953 04/16/15 0355 04/17/15 0548 04/18/15 0340 04/19/15 0400  NA 136 136 139 139 137 137  K 3.8 4.0 3.6 4.4 4.3 4.1  CL 98* 97* 96* 97* 93* 92*  CO2 34* 35* 36*  GLUCOSE 116* 103* 98 107*  113* 92  BUN 34* 29* 29* 32* 31* 29*  CREATININE 1.35* 1.38* 1.49* 1.58* 1.53* 1.37*  CALCIUM 8.6* 8.7* 9.0 8.8* 8.9 8.8*  MG 1.9  --  2.0 2.0  --  2.1    Liver Function Tests:  Recent Labs Lab 04/16/15 0355  AST 37  ALT 35  ALKPHOS 77  BILITOT 0.8  PROT 8.0  ALBUMIN 3.7   No results for input(s): LIPASE, AMYLASE in the last 168 hours. No results for input(s): AMMONIA in the last 168 hours.  CBC:  Recent Labs Lab 04/15/15 0840 04/15/15 1953 04/16/15 0355 04/17/15 0548  WBC 4.7 5.2 4.7 4.1  NEUTROABS 2.6  --   --   --   HGB 12.7 13.4 13.4 12.6  HCT 40.4 41.8 41.9 39.6  MCV 96.9 95.0 94.2 95.0  PLT 339 280 313 306    Cardiac Enzymes:  Recent Labs Lab 04/16/15 0355 04/16/15 1030 04/16/15 1540  TROPONINI <0.03 <0.03 <0.03    BNP: BNP (last 3 results)  Recent Labs  04/15/15 0840 04/15/15 1953 04/16/15 0355  BNP 172.0* 159.1* 262.1*    ProBNP (last 3 results)  Recent Labs  08/04/14 2339 08/21/14 1117  PROBNP 1727.0* 171.9*      Other results:  Imaging: No results found.   Medications:     Scheduled Medications: . allopurinol  300 mg Oral Daily  .  amiodarone  200 mg Oral BID  . aspirin EC  81 mg Oral Daily  . digoxin  0.0625 mg Oral Daily  . DULoxetine  30 mg Oral Daily  . enoxaparin (LOVENOX) injection  60 mg Subcutaneous Q24H  . ferrous sulfate  325 mg Oral TID WC  . furosemide  100 mg Intravenous BID  . gabapentin  600 mg Oral QID  . hydrALAZINE  25 mg Oral 3 times per day  . insulin aspart  0-20 Units Subcutaneous TID WC  . insulin detemir  58 Units Subcutaneous BID  . ivabradine  2.5 mg Oral BID WC  . Linaclotide  290 mcg Oral Daily  . magnesium oxide  400 mg Oral BID  . metolazone  5 mg Oral Daily  . pantoprazole  40 mg Oral Daily  . potassium chloride SA  60 mEq Oral BID  . ranolazine  500 mg Oral BID  . sildenafil  20 mg Oral TID  . sodium chloride  3 mL Intravenous Q12H  . spironolactone  25 mg Oral BID  .  temazepam  30 mg Oral QHS    Infusions: . milrinone 0.375 mcg/kg/min (04/20/15 0600)    PRN Medications: sodium chloride, acetaminophen, albuterol, cyclobenzaprine, ondansetron (ZOFRAN) IV, oxyCODONE-acetaminophen **AND** oxyCODONE, sodium chloride   Assessment/Plan    1. Acute/Chronic Systolic Heart Failure: Nonischemic cardiomyopathy, s/p [employer] CRT-D. EF 30-35% (03/2015). NYHA class IIIb symptoms on Milrinone 0.375 mcg, co-ox adequate. Remains volume overloaded, CVP 11-12 - Continue IV lasix 100 mg twice daily. -Continue current spironolactone, hydralazine, and digoxin. Digoxin level ok.  - No BB with low output.  - Continue ivabradine 2.5 mg twice a day. She had difficulty with 5 mg twice a day.  2. OSA: Using CPAP.  3. 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 Mag 1.8 =>Mag Continue to 400 mg bid. Watch K and Mg closely.  4. H/O GI bleed with AVM.  No evidence of acute bleeding.  5. Pulmonary HTN: RHC 03/31/15 PVR 10. Started sildenafil.  6. CKD: Follow creatinine carefully with diuresis, stable so far.   Diuresed well yesterday with addition of metolazone but no change in weight. May need additional metolazone today. Will likely need repeat RHC prior to discharge to assess VAD candidacy. Watch electrolytes closely. K 4.2 today   Length of Stay: 4  [name]  PA-C 04/20/2015, 7:55 AM  Advanced Heart Failure Team Pager [phone] (M-F; 7a - 4p)  Please contact [facility] for night-coverage after hours (4p -7a ) and weekends on [email or web address]  Patient seen and examined with [name], PA-C. We discussed all aspects of the encounter. I agree with the assessment and plan as stated above.   Co-ox is good. She is diuresing but weight unchanged despit lasix and metolazone. Increase lasix 120 IV q8. Continue metolazone. Will need RHC once euvolemic to see if pulmonary pressures improved with milrinone.   Bensimhon,  Daniel,MD 9:48 AM

## 2015-04-20 NOTE — Consult Note (Signed)
   [facility] CM Inpatient Consult   04/20/2015  [name] [birth year] [phone] Patient is currently active (up to the time of hospitalization) with  [facility] Care Management for chronic disease management services.  Patient has been engaged by a [employer].  Our community based plan of care has focused on disease management and community resource support.  Of note, [name] Care Management services does not replace or interfere with any services that are arranged by inpatient case management or social work.  For additional questions or referrals please contact:  Charlesetta Shanks, RN BSN CCM [facility]  [phone] business mobile phone

## 2015-04-20 NOTE — Care Management Important Message (Signed)
Important Message  Patient Details  Name: [name] MRN: [id number] Date of Birth: [birth year]   Medicare Important Message Given:  Yes-second notification given    [name] 04/20/2015, 12:15 PM

## 2015-04-21 ENCOUNTER — Encounter (HOSPITAL_COMMUNITY): Payer: Medicare Other

## 2015-04-21 LAB — BASIC METABOLIC PANEL
Anion gap: 10 (ref 5–15)
BUN: 33 mg/dL — ABNORMAL HIGH (ref 6–20)
CO2: 36 mmol/L — ABNORMAL HIGH (ref 22–32)
Calcium: 8.7 mg/dL — ABNORMAL LOW (ref 8.9–10.3)
Chloride: 91 mmol/L — ABNORMAL LOW (ref 101–111)
Creatinine, Ser: 1.55 mg/dL — ABNORMAL HIGH (ref 0.44–1.00)
GFR calc Af Amer: 44 mL/min — ABNORMAL LOW (ref >60)
GFR calc non Af Amer: 38 mL/min — ABNORMAL LOW (ref >60)
GLUCOSE: 116 mg/dL — AB (ref 65–99)
POTASSIUM: 3.8 mmol/L (ref 3.5–5.1)
Sodium: 137 mmol/L (ref 135–145)

## 2015-04-21 LAB — CARBOXYHEMOGLOBIN
Carboxyhemoglobin: 1 % (ref 0.5–1.5)
Carboxyhemoglobin: 1.8 % — ABNORMAL HIGH (ref 0.5–1.5)
METHEMOGLOBIN: 1.5 % (ref 0.0–1.5)
Methemoglobin: 1.3 % (ref 0.0–1.5)
O2 SAT: 87.3 %
O2 SAT: 28.6 %
TOTAL HEMOGLOBIN: 12.8 g/dL (ref 12.0–16.0)
Total hemoglobin: 9.1 g/dL — ABNORMAL LOW (ref 12.0–16.0)

## 2015-04-21 LAB — GLUCOSE, CAPILLARY
GLUCOSE-CAPILLARY: 106 mg/dL — AB (ref 65–99)
GLUCOSE-CAPILLARY: 121 mg/dL — AB (ref 65–99)
Glucose-Capillary: 65 mg/dL (ref 65–99)
Glucose-Capillary: 97 mg/dL (ref 65–99)
Glucose-Capillary: 89 mg/dL (ref 65–99)

## 2015-04-21 MED ORDER — ALPRAZOLAM 0.25 MG PO TABS
0.2500 mg | ORAL_TABLET | Freq: Two times a day (BID) | ORAL | Status: DC | PRN
  Administered 2015-04-21 – 2015-04-27 (×4): 0.25 mg via ORAL
  Filled 2015-04-21 (×4): qty 1

## 2015-04-21 NOTE — Progress Notes (Signed)
Advanced Directives, Living Will and HCPOA completed and notarized today.  Copy to shadow chart, copies will be mailed to [facility], Saundra Shelling ([name]'s father).  Emotional support and discussion after completion.  [name] is tearful today, verbalizes frustration with condition and worries about her daughter, [name].    I will be looking into a referral to [facility] counseling for [facility] with the agreement of [name].  Cindra Presume, RN-BC, MSN, [facility] Palliative Care
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
## 2015-04-21 NOTE — Progress Notes (Signed)
RT set up CPAP and placed on patient with 10 cmH20 and a large mask. Per home settings. Patient tolerating well.

## 2015-04-21 NOTE — Progress Notes (Signed)
Please disregard co-ox from 0333, this result is inaccurate.

## 2015-04-21 NOTE — Progress Notes (Signed)
Patient ID: [name], female   DOB: 1966-05-02, 49 y.o.   MRN: [id number] Advanced Heart Failure Rounding Note   Subjective:    Remains on milrinone. Continues to diurese with 4.3 L out yesterday. Weight up 2 lbs, still up from baseline of 263. Was eating Big Mac in room yesterday. Breathing improved.  CVP 7-8. Coox 87.3%  Cr trending up slightly 1.37 -> 1.41 -> 1.55   Objective:   Weight Range:  Vital Signs:   Temp:  [97.9 F (36.6 C)-98.2 F (36.8 C)] 97.9 F (36.6 C) (08/10 0749) Pulse Rate:  [70-136] 71 (08/10 0749) Resp:  [14-25] 14 (08/10 0749) BP: (90-129)/(46-76) 95/46 mmHg (08/10 0749) SpO2:  [87 %-99 %] 97 % (08/10 0749) Weight:  [272 lb 4.3 oz (123.5 kg)] 272 lb 4.3 oz (123.5 kg) (08/10 0311) Last BM Date: 04/16/15  Weight change: Filed Weights   04/19/15 0539 04/20/15 0320 04/21/15 0311  Weight: 270 lb 8.1 oz (122.7 kg) 270 lb 1 oz (122.5 kg) 272 lb 4.3 oz (123.5 kg)    Intake/Output:   Intake/Output Summary (Last 24 hours) at 04/21/15 0756 Last data filed at 04/21/15 0700  Gross per 24 hour  Intake   1931 ml  Output   4300 ml  Net  -2369 ml     Physical Exam: General:  Obese female lying in bed. No resp difficulty HEENT: normal Neck: Thick, JVP hard to see. Carotids 2+ bilat; no bruits. No lymphadenopathy or thryomegaly appreciated. Cor: PMI nondisplaced. Regular rate & rhythm. No rubs, gallops or murmurs. Lungs: clear Abdomen: soft, nontender, +distended. No hepatosplenomegaly. No bruits or masses. Good bowel sounds. Extremities: no cyanosis, clubbing, rash.  1+ ankle edema.  Neuro: alert & orientedx3, cranial nerves grossly intact. moves all 4 extremities w/o difficulty. Affect pleasant  Telemetry: Sinus with BiV pacing 70s. Occasional PVCs  Labs: Basic Metabolic Panel:  Recent Labs Lab 04/15/15 0840  04/16/15 0355 04/17/15 0548 04/18/15 0340 04/19/15 0400 04/20/15 0844 04/21/15 0437  NA 136  < > 139 139 137 137 136 137  K 3.8  < >  3.6 4.4 4.3 4.1 4.2 3.8  CL 98*  < > 96* 97* 93* 92* 93* 91*  CO2 31  < > 29 34* 35* 36* 35* 36*  GLUCOSE 116*  < > 98 107* 113* 92 123* 116*  BUN 34*  < > 29* 32* 31* 29* 30* 33*  CREATININE 1.35*  < > 1.49* 1.58* 1.53* 1.37* 1.41* 1.55*  CALCIUM 8.6*  < > 9.0 8.8* 8.9 8.8* 8.7* 8.7*  MG 1.9  --  2.0 2.0  --  2.1  --   --   < > = values in this interval not displayed.  Liver Function Tests:  Recent Labs Lab 04/16/15 0355  AST 37  ALT 35  ALKPHOS 77  BILITOT 0.8  PROT 8.0  ALBUMIN 3.7   No results for input(s): LIPASE, AMYLASE in the last 168 hours. No results for input(s): AMMONIA in the last 168 hours.  CBC:  Recent Labs Lab 04/15/15 0840 04/15/15 1953 04/16/15 0355 04/17/15 0548  WBC 4.7 5.2 4.7 4.1  NEUTROABS 2.6  --   --   --   HGB 12.7 13.4 13.4 12.6  HCT 40.4 41.8 41.9 39.6  MCV 96.9 95.0 94.2 95.0  PLT 339 280 313 306    Cardiac Enzymes:  Recent Labs Lab 04/16/15 0355 04/16/15 1030 04/16/15 1540  TROPONINI <0.03 <0.03 <0.03    BNP: BNP (last  3 results)  Recent Labs  04/15/15 0840 04/15/15 1953 04/16/15 0355  BNP 172.0* 159.1* 262.1*    ProBNP (last 3 results)  Recent Labs  08/04/14 2339 08/21/14 1117  PROBNP 1727.0* 171.9*      Other results:  Imaging: No results found.   Medications:     Scheduled Medications: . allopurinol  300 mg Oral Daily  . amiodarone  200 mg Oral BID  . aspirin EC  81 mg Oral Daily  . digoxin  0.0625 mg Oral Daily  . DULoxetine  30 mg Oral Daily  . enoxaparin (LOVENOX) injection  60 mg Subcutaneous Q24H  . ferrous sulfate  325 mg Oral TID WC  . furosemide  120 mg Intravenous Q8H  . gabapentin  600 mg Oral QID  . hydrALAZINE  25 mg Oral 3 times per day  . insulin aspart  0-20 Units Subcutaneous TID WC  . insulin detemir  58 Units Subcutaneous BID  . ivabradine  2.5 mg Oral BID WC  . Linaclotide  290 mcg Oral Daily  . magnesium oxide  400 mg Oral BID  . metolazone  5 mg Oral Daily  .  pantoprazole  40 mg Oral Daily  . potassium chloride SA  60 mEq Oral BID  . ranolazine  500 mg Oral BID  . sildenafil  20 mg Oral TID  . sodium chloride  3 mL Intravenous Q12H  . spironolactone  25 mg Oral BID  . temazepam  30 mg Oral QHS    Infusions: . milrinone 0.375 mcg/kg/min (04/21/15 0700)    PRN Medications: sodium chloride, acetaminophen, albuterol, bisacodyl, cyclobenzaprine, docusate sodium, ondansetron (ZOFRAN) IV, oxyCODONE-acetaminophen **AND** oxyCODONE, sodium chloride   Assessment/Plan    1. Acute/Chronic Systolic Heart Failure: Nonischemic cardiomyopathy, s/p [employer] CRT-D. EF 30-35% (03/2015). NYHA class IIIb symptoms on Milrinone 0.375 mcg, co-ox adequate. Remains volume overloaded, CVP 16 - Continue IV lasix 120 mg TID with metolazone. -Continue current spironolactone, hydralazine, and digoxin. Digoxin level ok.  - No BB with low output.  - Continue ivabradine 2.5 mg twice a day. She had difficulty with 5 mg twice a day.  2. OSA: Using CPAP.  3. 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 Mag 1.8 =>Mag Continue to 400 mg bid. Watch K and Mg closely.  4. H/O GI bleed with AVM.  No evidence of acute bleeding.  5. Pulmonary HTN: RHC 03/31/15 PVR 10. Started sildenafil.  6. CKD: Follow creatinine carefully with diuresis, stable so far.   Snuck in a Big Mac last night.  Re-educated on the importance of fluid and sodium restriction in HF. Did the same thing during last admission.  Non compliance concerning for adherence to medical regimen and VAD candidacy.  Diuresed well yesterday on increased lasix with metolazone .Will likely need to back of diuresis as to not overshoot baseline. Will discuss with MD. Weight shows up 2 lbs. Will need repeat RHC once euvolemic to assess VAD candidacy. Watch electrolytes closely. K 4.2 today.   Length of Stay: 5  [name]  PA-C 04/21/2015, 7:56 AM  Advanced Heart  Failure Team Pager [phone] (M-F; 7a - 4p)  Please contact [facility] for night-coverage after hours (4p -7a ) and weekends on [email or web address]  Patient seen and examined with [name], PA-C. We discussed all aspects of the encounter. I agree with the assessment and plan as stated above.   She remains volume overloaded despite brisk diuresis. Long talk about the  need for dietary compliance and fluid restriction. Will continue to diurese. Plan RHC once CVP < 10.   Sylvain Hasten,MD 5:09 PM

## 2015-04-22 LAB — CARBOXYHEMOGLOBIN
Carboxyhemoglobin: 1.1 % (ref 0.5–1.5)
METHEMOGLOBIN: 1.1 % (ref 0.0–1.5)
O2 Saturation: 61.7 %
Total hemoglobin: 12.8 g/dL (ref 12.0–16.0)

## 2015-04-22 LAB — BASIC METABOLIC PANEL
ANION GAP: 12 (ref 5–15)
BUN: 38 mg/dL — ABNORMAL HIGH (ref 6–20)
CALCIUM: 9.2 mg/dL (ref 8.9–10.3)
CHLORIDE: 87 mmol/L — AB (ref 101–111)
CO2: 37 mmol/L — AB (ref 22–32)
Creatinine, Ser: 1.83 mg/dL — ABNORMAL HIGH (ref 0.44–1.00)
GFR calc Af Amer: 36 mL/min — ABNORMAL LOW (ref >60)
GFR calc non Af Amer: 31 mL/min — ABNORMAL LOW (ref >60)
Glucose, Bld: 81 mg/dL (ref 65–99)
POTASSIUM: 3.8 mmol/L (ref 3.5–5.1)
SODIUM: 136 mmol/L (ref 135–145)

## 2015-04-22 LAB — GLUCOSE, CAPILLARY
GLUCOSE-CAPILLARY: 130 mg/dL — AB (ref 65–99)
Glucose-Capillary: 127 mg/dL — ABNORMAL HIGH (ref 65–99)
Glucose-Capillary: 157 mg/dL — ABNORMAL HIGH (ref 65–99)
Glucose-Capillary: 85 mg/dL (ref 65–99)

## 2015-04-22 MED ORDER — SODIUM CHLORIDE 0.9 % IJ SOLN: 3.0000 mL | INTRAMUSCULAR | Status: DC | PRN

## 2015-04-22 MED ORDER — SODIUM CHLORIDE 0.9 % IV SOLN
INTRAVENOUS | Status: DC
  Administered 2015-04-23: 500 mL via INTRAVENOUS

## 2015-04-22 MED ORDER — POTASSIUM CHLORIDE CRYS ER 20 MEQ PO TBCR
20.0000 meq | EXTENDED_RELEASE_TABLET | Freq: Once | ORAL | Status: AC
  Administered 2015-04-22: 20 meq via ORAL
  Filled 2015-04-22: qty 1

## 2015-04-22 MED ORDER — ASPIRIN 81 MG PO CHEW
81.0000 mg | CHEWABLE_TABLET | Freq: Once | ORAL | Status: AC
  Administered 2015-04-23: 81 mg via ORAL
  Filled 2015-04-22: qty 1

## 2015-04-22 MED ORDER — FUROSEMIDE 10 MG/ML IJ SOLN
80.0000 mg | Freq: Two times a day (BID) | INTRAMUSCULAR | Status: DC
  Administered 2015-04-23 – 2015-04-24 (×2): 80 mg via INTRAVENOUS
  Filled 2015-04-22 (×4): qty 8

## 2015-04-22 MED ORDER — SODIUM CHLORIDE 0.9 % IJ SOLN
3.0000 mL | Freq: Two times a day (BID) | INTRAMUSCULAR | Status: DC
  Administered 2015-04-22: 3 mL via INTRAVENOUS

## 2015-04-22 MED ORDER — SODIUM CHLORIDE 0.9 % IV SOLN: 250.0000 mL | INTRAVENOUS | Status: DC | PRN

## 2015-04-22 MED ORDER — GABAPENTIN 300 MG PO CAPS
600.0000 mg | ORAL_CAPSULE | Freq: Three times a day (TID) | ORAL | Status: DC
  Administered 2015-04-22 – 2015-04-28 (×18): 600 mg via ORAL
  Filled 2015-04-22 (×19): qty 2

## 2015-04-22 NOTE — Progress Notes (Signed)
Pt complained of not having a bowel movement in a few days.  Pt states she deals with constipation at home all of the time, and uses laxatives for relief.  PRN colace and PRN ducolax suppository given to pt.

## 2015-04-22 NOTE — Progress Notes (Signed)
Patient ID: [name], female   DOB: [birth year], 49 y.o.   MRN: [id number] Advanced Heart Failure Rounding Note   Subjective:    Remains on milrinone. Out 1.6 L and down 3 lbs. Breathing improved.  CVP 11. Coox 61.7%  Cr up 1.41 -> 1.55 -> 1.83 Says shes taking things a day at a time.   Objective:   Weight Range:  Vital Signs:   Temp:  [97.3 F (36.3 C)-98.4 F (36.9 C)] 97.6 F (36.4 C) (08/11 0727) Pulse Rate:  [77-84] 84 (08/11 0910) Resp:  [16-18] 18 (08/11 0727) BP: (103-134)/(52-74) 134/74 mmHg (08/11 0727) SpO2:  [95 %-99 %] 95 % (08/11 0727) Weight:  [269 lb 12.8 oz (122.38 kg)] 269 lb 12.8 oz (122.38 kg) (08/11 0500) Last BM Date: 04/16/15  Weight change: Filed Weights   04/20/15 0320 04/21/15 0311 04/22/15 0500  Weight: 270 lb 1 oz (122.5 kg) 272 lb 4.3 oz (123.5 kg) 269 lb 12.8 oz (122.38 kg)    Intake/Output:   Intake/Output Summary (Last 24 hours) at 04/22/15 1020 Last data filed at 04/22/15 0600  Gross per 24 hour  Intake    877 ml  Output   2875 ml  Net  -1998 ml     Physical Exam: CVP: 11 General:  Obese female lying in bed. No resp difficulty HEENT: normal Neck: Thick, JVP hard to see. Carotids 2+ bilat; no bruits. No lymphadenopathy or thryomegaly appreciated. Cor: PMI nondisplaced. Regular rate & rhythm. No rubs, gallops or murmurs. Lungs: clear Abdomen: soft, nontender, +distended. No hepatosplenomegaly. No bruits or masses. Good bowel sounds. Extremities: no cyanosis, clubbing, rash.  1+ ankle edema.  Neuro: alert & orientedx3, cranial nerves grossly intact. moves all 4 extremities w/o difficulty. Affect depressed  Telemetry: Sinus with BiV pacing 70s. Occasional PVCs  Labs: Basic Metabolic Panel:  Recent Labs Lab 04/16/15 0355 04/17/15 0548 04/18/15 0340 04/19/15 0400 04/20/15 0844 04/21/15 0437 04/22/15 0230  NA 139 139 137 137 136 137 136  K 3.6 4.4 4.3 4.1 4.2 3.8 3.8  CL 96* 97* 93* 92* 93* 91* 87*  CO2 29 34* 35* 36*  35* 36* 37*  GLUCOSE 98 107* 113* 92 123* 116* 81  BUN 29* 32* 31* 29* 30* 33* 38*  CREATININE 1.49* 1.58* 1.53* 1.37* 1.41* 1.55* 1.83*  CALCIUM 9.0 8.8* 8.9 8.8* 8.7* 8.7* 9.2  MG 2.0 2.0  --  2.1  --   --   --     Liver Function Tests:  Recent Labs Lab 04/16/15 0355  AST 37  ALT 35  ALKPHOS 77  BILITOT 0.8  PROT 8.0  ALBUMIN 3.7   No results for input(s): LIPASE, AMYLASE in the last 168 hours. No results for input(s): AMMONIA in the last 168 hours.  CBC:  Recent Labs Lab 04/15/15 1953 04/16/15 0355 04/17/15 0548  WBC 5.2 4.7 4.1  HGB 13.4 13.4 12.6  HCT 41.8 41.9 39.6  MCV 95.0 94.2 95.0  PLT 280 313 306    Cardiac Enzymes:  Recent Labs Lab 04/16/15 0355 04/16/15 1030 04/16/15 1540  TROPONINI <0.03 <0.03 <0.03    BNP: BNP (last 3 results)  Recent Labs  04/15/15 0840 04/15/15 1953 04/16/15 0355  BNP 172.0* 159.1* 262.1*    ProBNP (last 3 results)  Recent Labs  08/04/14 2339 08/21/14 1117  PROBNP 1727.0* 171.9*      Other results:  Imaging: No results found.   Medications:     Scheduled Medications: . allopurinol  300  mg Oral Daily  . amiodarone  200 mg Oral BID  . aspirin EC  81 mg Oral Daily  . digoxin  0.0625 mg Oral Daily  . DULoxetine  30 mg Oral Daily  . enoxaparin (LOVENOX) injection  60 mg Subcutaneous Q24H  . ferrous sulfate  325 mg Oral TID WC  . furosemide  120 mg Intravenous Q8H  . gabapentin  600 mg Oral QID  . hydrALAZINE  25 mg Oral 3 times per day  . insulin aspart  0-20 Units Subcutaneous TID WC  . insulin detemir  58 Units Subcutaneous BID  . ivabradine  2.5 mg Oral BID WC  . Linaclotide  290 mcg Oral Daily  . magnesium oxide  400 mg Oral BID  . metolazone  5 mg Oral Daily  . pantoprazole  40 mg Oral Daily  . potassium chloride SA  60 mEq Oral BID  . ranolazine  500 mg Oral BID  . sildenafil  20 mg Oral TID  . sodium chloride  3 mL Intravenous Q12H  . spironolactone  25 mg Oral BID  . temazepam  30  mg Oral QHS    Infusions: . milrinone 0.375 mcg/kg/min (04/22/15 1004)    PRN Medications: sodium chloride, acetaminophen, albuterol, ALPRAZolam, bisacodyl, cyclobenzaprine, docusate sodium, ondansetron (ZOFRAN) IV, oxyCODONE-acetaminophen **AND** oxyCODONE, sodium chloride   Assessment/Plan    1. Acute/Chronic Systolic Heart Failure: Nonischemic cardiomyopathy, s/p [employer] CRT-D. EF 30-35% (03/2015). NYHA class IIIb symptoms on Milrinone 0.375 mcg, co-ox adequate. Remains volume overloaded, CVP 16 - Continue IV lasix 120 mg TID with metolazone. -Continue current spironolactone, hydralazine, and digoxin. Digoxin level ok.  - No BB with low output.  - Continue ivabradine 2.5 mg twice a day. She had difficulty with 5 mg twice a day.  2. OSA: Using CPAP.  3. 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 Mag 2.1 Continue to 400 mg bid. Watch K and Mg closely.  4. H/O GI bleed with AVM.  No evidence of acute bleeding.  5. Pulmonary HTN: RHC 03/31/15 PVR 10. Started sildenafil.  6. CKD: Follow creatinine carefully with diuresis, stable so far.   Ongoing reeducation on the importance of fluid and sodium restriction in HF.  Non compliance concerning for adherence to medical regimen, prognosis, and VAD candidacy.  Diuresed well yesterday on increased lasix with metolazone, but Cr worsening. Has received 2/3 doses of 120 mg IV lasix today.  Will discuss with MD on holding evening dose vs decreasing.  Will need repeat RHC once euvolemic to assess VAD candidacy. Watch electrolytes closely. K 3.8 today, will supp.  Length of Stay: 6  [name]  PA-C 04/22/2015, 10:20 AM  Advanced Heart Failure Team Pager [phone] (M-F; 7a - 4p)  Please contact [facility] for night-coverage after hours (4p -7a ) and weekends on [email or web address]  Patient seen and examined with [name], PA-C. We discussed all aspects of the encounter. I agree  with the assessment and plan as stated above.   CVP down to 10-12 range. Co-ox ok. Creatinine trending up. I think this as good as we may be able to get her. Will plan RHC tomorrow to see if she has improved with milrinone Doubt she will be candidate for advanced HF therapies.  Bensimhon, Daniel,MD 5:39 PM

## 2015-04-23 ENCOUNTER — Encounter (HOSPITAL_COMMUNITY)
Admission: EM | Disposition: A | Discharge status: Home Health | Payer: Medicare Other | Source: Home / Self Care | Attending: Internal Medicine

## 2015-04-23 ENCOUNTER — Encounter (HOSPITAL_COMMUNITY): Payer: Self-pay | Admitting: Internal Medicine

## 2015-04-23 DIAGNOSIS — I472 Ventricular tachycardia: Secondary | ICD-10-CM

## 2015-04-23 HISTORY — PX: CARDIAC CATHETERIZATION: SHX172

## 2015-04-23 LAB — POCT I-STAT 3, VENOUS BLOOD GAS (G3P V)
ACID-BASE EXCESS: 10 mmol/L — AB (ref 0.0–2.0)
ACID-BASE EXCESS: 8 mmol/L — AB (ref 0.0–2.0)
Acid-Base Excess: 9 mmol/L — ABNORMAL HIGH (ref 0.0–2.0)
BICARBONATE: 37.5 meq/L — AB (ref 20.0–24.0)
Bicarbonate: 35.1 mEq/L — ABNORMAL HIGH (ref 20.0–24.0)
Bicarbonate: 36.8 mEq/L — ABNORMAL HIGH (ref 20.0–24.0)
O2 SAT: 66 %
O2 Saturation: 70 %
O2 Saturation: 72 %
PCO2 VEN: 63.4 mmHg — AB (ref 45.0–50.0)
PH VEN: 7.372 — AB (ref 7.250–7.300)
PO2 VEN: 40 mmHg (ref 30.0–45.0)
TCO2: 37 mmol/L (ref 0–100)
TCO2: 39 mmol/L (ref 0–100)
TCO2: 39 mmol/L (ref 0–100)
pCO2, Ven: 60.2 mmHg — ABNORMAL HIGH (ref 45.0–50.0)
pCO2, Ven: 64.1 mmHg — ABNORMAL HIGH (ref 45.0–50.0)
pH, Ven: 7.374 — ABNORMAL HIGH (ref 7.250–7.300)
pH, Ven: 7.376 — ABNORMAL HIGH (ref 7.250–7.300)
pO2, Ven: 36 mmHg (ref 30.0–45.0)
pO2, Ven: 39 mmHg (ref 30.0–45.0)

## 2015-04-23 LAB — BASIC METABOLIC PANEL
Anion gap: 10 (ref 5–15)
BUN: 39 mg/dL — ABNORMAL HIGH (ref 6–20)
CHLORIDE: 86 mmol/L — AB (ref 101–111)
CO2: 36 mmol/L — AB (ref 22–32)
Calcium: 8.8 mg/dL — ABNORMAL LOW (ref 8.9–10.3)
Creatinine, Ser: 1.9 mg/dL — ABNORMAL HIGH (ref 0.44–1.00)
GFR, EST AFRICAN AMERICAN: 35 mL/min — AB (ref >60)
GFR, EST NON AFRICAN AMERICAN: 30 mL/min — AB (ref >60)
GLUCOSE: 189 mg/dL — AB (ref 65–99)
Potassium: 4 mmol/L (ref 3.5–5.1)
SODIUM: 132 mmol/L — AB (ref 135–145)

## 2015-04-23 LAB — GLUCOSE, CAPILLARY
GLUCOSE-CAPILLARY: 105 mg/dL — AB (ref 65–99)
Glucose-Capillary: 94 mg/dL (ref 65–99)
Glucose-Capillary: 145 mg/dL — ABNORMAL HIGH (ref 65–99)
Glucose-Capillary: 94 mg/dL (ref 65–99)

## 2015-04-23 LAB — CBC
HEMATOCRIT: 40.1 % (ref 36.0–46.0)
Hemoglobin: 12.9 g/dL (ref 12.0–15.0)
MCH: 30.8 pg (ref 26.0–34.0)
MCHC: 32.2 g/dL (ref 30.0–36.0)
MCV: 95.7 fL (ref 78.0–100.0)
PLATELETS: 210 10*3/uL (ref 150–400)
RBC: 4.19 MIL/uL (ref 3.87–5.11)
RDW: 15.4 % (ref 11.5–15.5)
WBC: 4.3 10*3/uL (ref 4.0–10.5)

## 2015-04-23 LAB — CARBOXYHEMOGLOBIN
CARBOXYHEMOGLOBIN: 1.5 % (ref 0.5–1.5)
Methemoglobin: 1.2 % (ref 0.0–1.5)
O2 SAT: 79.5 %
Total hemoglobin: 12.9 g/dL (ref 12.0–16.0)

## 2015-04-23 LAB — PROTIME-INR
INR: 1.13 (ref 0.00–1.49)
Prothrombin Time: 14.7 seconds (ref 11.6–15.2)

## 2015-04-23 LAB — CREATININE, SERUM
CREATININE: 1.67 mg/dL — AB (ref 0.44–1.00)
GFR calc Af Amer: 41 mL/min — ABNORMAL LOW (ref >60)
GFR calc non Af Amer: 35 mL/min — ABNORMAL LOW (ref >60)

## 2015-04-23 LAB — MAGNESIUM: Magnesium: 2.4 mg/dL (ref 1.7–2.4)

## 2015-04-23 SURGERY — RIGHT HEART CATH
Anesthesia: LOCAL

## 2015-04-23 MED ORDER — ACETAMINOPHEN 325 MG PO TABS: 650.0000 mg | ORAL_TABLET | ORAL | Status: DC | PRN

## 2015-04-23 MED ORDER — LIDOCAINE HCL (PF) 1 % IJ SOLN
INTRAMUSCULAR | Status: DC | PRN
  Administered 2015-04-23: 250 mL

## 2015-04-23 MED ORDER — FENTANYL CITRATE (PF) 100 MCG/2ML IJ SOLN
INTRAMUSCULAR | Status: DC | PRN
  Administered 2015-04-23: 25 ug via INTRAVENOUS

## 2015-04-23 MED ORDER — SODIUM CHLORIDE 0.9 % IJ SOLN: 3.0000 mL | INTRAMUSCULAR | Status: DC | PRN

## 2015-04-23 MED ORDER — ENOXAPARIN SODIUM 60 MG/0.6ML ~~LOC~~ SOLN: 60.0000 mg | SUBCUTANEOUS | Status: DC

## 2015-04-23 MED ORDER — FENTANYL CITRATE (PF) 100 MCG/2ML IJ SOLN
INTRAMUSCULAR | Status: AC
Start: 2015-04-23 — End: 2015-04-23
  Filled 2015-04-23: qty 4

## 2015-04-23 MED ORDER — SODIUM CHLORIDE 0.9 % IV SOLN: 250.0000 mL | INTRAVENOUS | Status: DC | PRN

## 2015-04-23 MED ORDER — WHITE PETROLATUM GEL
Status: AC
Start: 2015-04-23 — End: 2015-04-23
  Administered 2015-04-23: 17:00:00
  Filled 2015-04-23: qty 1

## 2015-04-23 MED ORDER — SODIUM CHLORIDE 0.9 % IJ SOLN
3.0000 mL | Freq: Two times a day (BID) | INTRAMUSCULAR | Status: DC
  Administered 2015-04-24 – 2015-04-26 (×6): 3 mL via INTRAVENOUS

## 2015-04-23 MED ORDER — HEPARIN (PORCINE) IN NACL 2-0.9 UNIT/ML-% IJ SOLN
INTRAMUSCULAR | Status: AC
  Filled 2015-04-23: qty 500

## 2015-04-23 MED ORDER — ONDANSETRON HCL 4 MG/2ML IJ SOLN: 4.0000 mg | Freq: Four times a day (QID) | INTRAMUSCULAR | Status: DC | PRN

## 2015-04-23 MED ORDER — GUAIFENESIN-DM 100-10 MG/5ML PO SYRP
15.0000 mL | ORAL_SOLUTION | ORAL | Status: DC | PRN
  Administered 2015-04-23: 15 mL via ORAL
  Filled 2015-04-23: qty 15

## 2015-04-23 MED ORDER — SODIUM CHLORIDE 0.9 % IJ SOLN
3.0000 mL | Freq: Two times a day (BID) | INTRAMUSCULAR | Status: DC
  Administered 2015-04-24 – 2015-04-28 (×6): 3 mL via INTRAVENOUS

## 2015-04-23 MED ORDER — LIDOCAINE HCL (PF) 1 % IJ SOLN
INTRAMUSCULAR | Status: AC
Start: 2015-04-23 — End: 2015-04-23
  Filled 2015-04-23: qty 30

## 2015-04-23 MED ORDER — LIDOCAINE HCL (PF) 1 % IJ SOLN
INTRAMUSCULAR | Status: DC | PRN
Start: 2015-04-23 — End: 2015-04-23
  Administered 2015-04-23: 10 mL

## 2015-04-23 MED ORDER — HEPARIN (PORCINE) IN NACL 2-0.9 UNIT/ML-% IJ SOLN
INTRAMUSCULAR | Status: AC
  Filled 2015-04-23: qty 1000

## 2015-04-23 SURGICAL SUPPLY — 9 items
CATH SWAN GANZ 7F STRAIGHT (CATHETERS) ×1 IMPLANT
HOVERMATT SINGLE USE (MISCELLANEOUS) ×1 IMPLANT
KIT HEART RIGHT NAMIC (KITS) ×2 IMPLANT
PACK CARDIAC CATHETERIZATION (CUSTOM PROCEDURE TRAY) ×2 IMPLANT
PROTECTION STATION PRESSURIZED (MISCELLANEOUS) ×2
SHEATH PINNACLE 7F 10CM (SHEATH) ×1 IMPLANT
SLEEVE REPOSITIONING LENGTH 30 (MISCELLANEOUS) ×1 IMPLANT
STATION PROTECTION PRESSURIZED (MISCELLANEOUS) IMPLANT
TRANSDUCER W/STOPCOCK (MISCELLANEOUS) ×3 IMPLANT

## 2015-04-23 NOTE — Progress Notes (Signed)
Not coming back to SDU, belongings moved to [facility], report given to RN.

## 2015-04-23 NOTE — Interval H&P Note (Signed)
History and Physical Interval Note:  04/23/2015 10:30 AM  [name]  has presented today for surgery, with the diagnosis of chf  The various methods of treatment have been discussed with the patient and family. After consideration of risks, benefits and other options for treatment, the patient has consented to  Procedure(s): Right Heart Cath ([name]) as a surgical intervention .  The patient's history has been reviewed, patient examined, no change in status, stable for surgery.  I have reviewed the patient's chart and labs.  Questions were answered to the patient's satisfaction.     [name], [name]

## 2015-04-23 NOTE — H&P (View-Only) (Signed)
Patient ID: [name], female   DOB: 01/05/1966, 49 y.o.   MRN: [id number] Advanced Heart Failure Rounding Note   Subjective:    Remains on milrinone. Out 1.6 L and down 3 lbs. Breathing improved.  CVP 11. Coox 61.7%  Cr up 1.41 -> 1.55 -> 1.83 Says shes taking things a day at a time.   Objective:   Weight Range:  Vital Signs:   Temp:  [97.3 F (36.3 C)-98.4 F (36.9 C)] 97.6 F (36.4 C) (08/11 0727) Pulse Rate:  [77-84] 84 (08/11 0910) Resp:  [16-18] 18 (08/11 0727) BP: (103-134)/(52-74) 134/74 mmHg (08/11 0727) SpO2:  [95 %-99 %] 95 % (08/11 0727) Weight:  [269 lb 12.8 oz (122.38 kg)] 269 lb 12.8 oz (122.38 kg) (08/11 0500) Last BM Date: 04/16/15  Weight change: Filed Weights   04/20/15 0320 04/21/15 0311 04/22/15 0500  Weight: 270 lb 1 oz (122.5 kg) 272 lb 4.3 oz (123.5 kg) 269 lb 12.8 oz (122.38 kg)    Intake/Output:   Intake/Output Summary (Last 24 hours) at 04/22/15 1020 Last data filed at 04/22/15 0600  Gross per 24 hour  Intake    877 ml  Output   2875 ml  Net  -1998 ml     Physical Exam: CVP: 11 General:  Obese female lying in bed. No resp difficulty HEENT: normal Neck: Thick, JVP hard to see. Carotids 2+ bilat; no bruits. No lymphadenopathy or thryomegaly appreciated. Cor: PMI nondisplaced. Regular rate & rhythm. No rubs, gallops or murmurs. Lungs: clear Abdomen: soft, nontender, +distended. No hepatosplenomegaly. No bruits or masses. Good bowel sounds. Extremities: no cyanosis, clubbing, rash.  1+ ankle edema.  Neuro: alert & orientedx3, cranial nerves grossly intact. moves all 4 extremities w/o difficulty. Affect depressed  Telemetry: Sinus with BiV pacing 70s. Occasional PVCs  Labs: Basic Metabolic Panel:  Recent Labs Lab 04/16/15 0355 04/17/15 0548 04/18/15 0340 04/19/15 0400 04/20/15 0844 04/21/15 0437 04/22/15 0230  NA 139 139 137 137 136 137 136  K 3.6 4.4 4.3 4.1 4.2 3.8 3.8  CL 96* 97* 93* 92* 93* 91* 87*  CO2 29 34* 35* 36*  35* 36* 37*  GLUCOSE 98 107* 113* 92 123* 116* 81  BUN 29* 32* 31* 29* 30* 33* 38*  CREATININE 1.49* 1.58* 1.53* 1.37* 1.41* 1.55* 1.83*  CALCIUM 9.0 8.8* 8.9 8.8* 8.7* 8.7* 9.2  MG 2.0 2.0  --  2.1  --   --   --     Liver Function Tests:  Recent Labs Lab 04/16/15 0355  AST 37  ALT 35  ALKPHOS 77  BILITOT 0.8  PROT 8.0  ALBUMIN 3.7   No results for input(s): LIPASE, AMYLASE in the last 168 hours. No results for input(s): AMMONIA in the last 168 hours.  CBC:  Recent Labs Lab 04/15/15 1953 04/16/15 0355 04/17/15 0548  WBC 5.2 4.7 4.1  HGB 13.4 13.4 12.6  HCT 41.8 41.9 39.6  MCV 95.0 94.2 95.0  PLT 280 313 306    Cardiac Enzymes:  Recent Labs Lab 04/16/15 0355 04/16/15 1030 04/16/15 1540  TROPONINI <0.03 <0.03 <0.03    BNP: BNP (last 3 results)  Recent Labs  04/15/15 0840 04/15/15 1953 04/16/15 0355  BNP 172.0* 159.1* 262.1*    ProBNP (last 3 results)  Recent Labs  08/04/14 2339 08/21/14 1117  PROBNP 1727.0* 171.9*      Other results:  Imaging: No results found.   Medications:     Scheduled Medications: . allopurinol  300   mg Oral Daily  . amiodarone  200 mg Oral BID  . aspirin EC  81 mg Oral Daily  . digoxin  0.0625 mg Oral Daily  . DULoxetine  30 mg Oral Daily  . enoxaparin (LOVENOX) injection  60 mg Subcutaneous Q24H  . ferrous sulfate  325 mg Oral TID WC  . furosemide  120 mg Intravenous Q8H  . gabapentin  600 mg Oral QID  . hydrALAZINE  25 mg Oral 3 times per day  . insulin aspart  0-20 Units Subcutaneous TID WC  . insulin detemir  58 Units Subcutaneous BID  . ivabradine  2.5 mg Oral BID WC  . Linaclotide  290 mcg Oral Daily  . magnesium oxide  400 mg Oral BID  . metolazone  5 mg Oral Daily  . pantoprazole  40 mg Oral Daily  . potassium chloride SA  60 mEq Oral BID  . ranolazine  500 mg Oral BID  . sildenafil  20 mg Oral TID  . sodium chloride  3 mL Intravenous Q12H  . spironolactone  25 mg Oral BID  . temazepam  30  mg Oral QHS    Infusions: . milrinone 0.375 mcg/kg/min (04/22/15 1004)    PRN Medications: sodium chloride, acetaminophen, albuterol, ALPRAZolam, bisacodyl, cyclobenzaprine, docusate sodium, ondansetron (ZOFRAN) IV, oxyCODONE-acetaminophen **AND** oxyCODONE, sodium chloride   Assessment/Plan    1. Acute/Chronic Systolic Heart Failure: Nonischemic cardiomyopathy, s/p [employer] CRT-D. EF 30-35% (03/2015). NYHA class IIIb symptoms on Milrinone 0.375 mcg, co-ox adequate. Remains volume overloaded, CVP 16 - Continue IV lasix 120 mg TID with metolazone. -Continue current spironolactone, hydralazine, and digoxin. Digoxin level ok.  - No BB with low output.  - Continue ivabradine 2.5 mg twice a day. She had difficulty with 5 mg twice a day.  2. OSA: Using CPAP.  3. 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 Mag 2.1 Continue to 400 mg bid. Watch K and Mg closely.  4. H/O GI bleed with AVM.  No evidence of acute bleeding.  5. Pulmonary HTN: RHC 03/31/15 PVR 10. Started sildenafil.  6. CKD: Follow creatinine carefully with diuresis, stable so far.   Ongoing reeducation on the importance of fluid and sodium restriction in HF.  Non compliance concerning for adherence to medical regimen, prognosis, and VAD candidacy.  Diuresed well yesterday on increased lasix with metolazone, but Cr worsening. Has received 2/3 doses of 120 mg IV lasix today.  Will discuss with MD on holding evening dose vs decreasing.  Will need repeat RHC once euvolemic to assess VAD candidacy. Watch electrolytes closely. K 3.8 today, will supp.  Length of Stay: 6  [name]  PA-C 04/22/2015, 10:20 AM  Advanced Heart Failure Team Pager [phone] (M-F; 7a - 4p)  Please contact [facility] for night-coverage after hours (4p -7a ) and weekends on [email or web address]  Patient seen and examined with Barbara Saber, PA-C. We discussed all aspects of the encounter. I agree  with the assessment and plan as stated above.   CVP down to 10-12 range. Co-ox ok. Creatinine trending up. I think this as good as we may be able to get her. Will plan RHC tomorrow to see if she has improved with milrinone Doubt she will be candidate for advanced HF therapies.  Barbara Furnish,MD 5:39 PM

## 2015-04-23 NOTE — Care Management Important Message (Signed)
Important Message  Patient Details  Name: [name] MRN: [id number] Date of Birth: 12-15-1965   Medicare Important Message Given:  Yes-third notification given    [name] 04/23/2015, 12:04 PM

## 2015-04-23 NOTE — Progress Notes (Signed)
Patient ID: [name], female   DOB: 03/23/1966, 49 y.o.   MRN: [id number] Advanced Heart Failure Rounding Note   Subjective:    Remains on milrinone.   Feeling ok today.  Denies CP, SOB, lightheadedness, or orthopnea.   Minimal I/O deficit yesterday with decreased lasix, lots of po intake.   CVP 18-19. Coox 79.5%   Cr up 1.41 -> 1.55 -> 1.83 -> 1.90.    Objective:   Weight Range:  Vital Signs:   Temp:  [97.6 F (36.4 C)-98 F (36.7 C)] 97.6 F (36.4 C) (08/12 0741) Pulse Rate:  [70-84] 70 (08/11 2029) Resp:  [16-20] 18 (08/12 0741) BP: (100-115)/(47-81) 110/81 mmHg (08/12 0741) SpO2:  [95 %-96 %] 95 % (08/12 0741) Weight:  [271 lb 8 oz (123.152 kg)] 271 lb 8 oz (123.152 kg) (08/12 0356) Last BM Date: 04/16/15  Weight change: Filed Weights   04/21/15 0311 04/22/15 0500 04/23/15 0356  Weight: 272 lb 4.3 oz (123.5 kg) 269 lb 12.8 oz (122.38 kg) 271 lb 8 oz (123.152 kg)    Intake/Output:   Intake/Output Summary (Last 24 hours) at 04/23/15 0814 Last data filed at 04/23/15 0800  Gross per 24 hour  Intake   1856 ml  Output   2000 ml  Net   -144 ml     Physical Exam: CVP: 18-19 General:  Obese female lying in bed. No resp difficulty HEENT: normal Neck: Thick, JVP hard to see. Carotids 2+ bilat; no bruits. No lymphadenopathy or thryomegaly appreciated. Cor: PMI nondisplaced. Regular rate & rhythm. No rubs, gallops or murmurs. Lungs: clear Abdomen: soft, nontender, +distended. No hepatosplenomegaly. No bruits or masses. Good bowel sounds. Extremities: no cyanosis, clubbing, rash.  1+ ankle edema.  Neuro: alert & orientedx3, cranial nerves grossly intact. moves all 4 extremities w/o difficulty. Affect depressed  Telemetry: Sinus with BiV pacing 70s and occasional PVCs  Labs: Basic Metabolic Panel:  Recent Labs Lab 04/17/15 0548  04/19/15 0400 04/20/15 0844 04/21/15 0437 04/22/15 0230 04/23/15 0359  NA 139  < > 137 136 137 136 132*  K 4.4  < > 4.1 4.2  3.8 3.8 4.0  CL 97*  < > 92* 93* 91* 87* 86*  CO2 34*  < > 36* 35* 36* 37* 36*  GLUCOSE 107*  < > 92 123* 116* 81 189*  BUN 32*  < > 29* 30* 33* 38* 39*  CREATININE 1.58*  < > 1.37* 1.41* 1.55* 1.83* 1.90*  CALCIUM 8.8*  < > 8.8* 8.7* 8.7* 9.2 8.8*  MG 2.0  --  2.1  --   --   --   --   < > = values in this interval not displayed.  Liver Function Tests: No results for input(s): AST, ALT, ALKPHOS, BILITOT, PROT, ALBUMIN in the last 168 hours. No results for input(s): LIPASE, AMYLASE in the last 168 hours. No results for input(s): AMMONIA in the last 168 hours.  CBC:  Recent Labs Lab 04/17/15 0548  WBC 4.1  HGB 12.6  HCT 39.6  MCV 95.0  PLT 306    Cardiac Enzymes:  Recent Labs Lab 04/16/15 1030 04/16/15 1540  TROPONINI <0.03 <0.03    BNP: BNP (last 3 results)  Recent Labs  04/15/15 0840 04/15/15 1953 04/16/15 0355  BNP 172.0* 159.1* 262.1*    ProBNP (last 3 results)  Recent Labs  08/04/14 2339 08/21/14 1117  PROBNP 1727.0* 171.9*      Other results:  Imaging: No results found.   Medications:  Scheduled Medications: . allopurinol  300 mg Oral Daily  . amiodarone  200 mg Oral BID  . aspirin EC  81 mg Oral Daily  . digoxin  0.0625 mg Oral Daily  . DULoxetine  30 mg Oral Daily  . enoxaparin (LOVENOX) injection  60 mg Subcutaneous Q24H  . ferrous sulfate  325 mg Oral TID WC  . furosemide  80 mg Intravenous BID  . gabapentin  600 mg Oral TID  . hydrALAZINE  25 mg Oral 3 times per day  . insulin aspart  0-20 Units Subcutaneous TID WC  . insulin detemir  58 Units Subcutaneous BID  . ivabradine  2.5 mg Oral BID WC  . Linaclotide  290 mcg Oral Daily  . magnesium oxide  400 mg Oral BID  . metolazone  5 mg Oral Daily  . pantoprazole  40 mg Oral Daily  . potassium chloride SA  60 mEq Oral BID  . ranolazine  500 mg Oral BID  . sildenafil  20 mg Oral TID  . sodium chloride  3 mL Intravenous Q12H  . sodium chloride  3 mL Intravenous Q12H  .  spironolactone  25 mg Oral BID  . temazepam  30 mg Oral QHS    Infusions: . sodium chloride 500 mL (04/23/15 0535)  . milrinone 0.375 mcg/kg/min (04/23/15 0800)    PRN Medications: sodium chloride, sodium chloride, acetaminophen, albuterol, ALPRAZolam, bisacodyl, cyclobenzaprine, docusate sodium, guaiFENesin-dextromethorphan, ondansetron (ZOFRAN) IV, oxyCODONE-acetaminophen **AND** oxyCODONE, sodium chloride, sodium chloride   Assessment/Plan    1. Acute/Chronic Systolic Heart Failure: Nonischemic cardiomyopathy, s/p [employer] CRT-D. EF 30-35% (03/2015). NYHA class IIIb symptoms on Milrinone 0.375 mcg, co-ox adequate. Remains volume overloaded, CVP 16 - Will address diuretic status with RHC today. -Continue current spironolactone, hydralazine, and digoxin. Digoxin level ok.  - No BB with low output.  - Continue ivabradine 2.5 mg twice a day. She had difficulty with 5 mg twice a day.  2. OSA: Using CPAP.  3. 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 Mag 2.1 Continue to 400 mg bid. Watch K and Mg closely.  4. H/O GI bleed with AVM.  No evidence of acute bleeding.  5. Pulmonary HTN: RHC 03/31/15 PVR 10. Started sildenafil.  6. CKD: Follow creatinine carefully with diuresis, stable so far.   RHC this am. Doubt she will be candidate for HF advanced therapies with comorbidities and non compliance issues.   Talked about fluid restriction.  Weight up 2 lbs from yesterday with decrease of diuresis to 80 BID.  Will reassess diuretics after RHC today.  Watch electrolytes closely.   Length of Stay: 7  [name]  PA-C 04/23/2015, 8:14 AM  Advanced Heart Failure Team Pager [phone] (M-F; 7a - 4p)  Please contact [facility] for night-coverage after hours (4p -7a ) and weekends on [email or web address]  Patient seen and examined with [name], PA-C. We discussed all aspects of the encounter. I agree with the assessment and plan as  stated above.   Volume status remains elevated however renal function getting worse. Will need RHC today. Will likely leave [place] in. VT quiescent. Continue to watch electrolytes closely.    [name],MD 11:07 AM

## 2015-04-24 ENCOUNTER — Encounter (HOSPITAL_COMMUNITY): Payer: Self-pay | Admitting: Anesthesiology

## 2015-04-24 DIAGNOSIS — R570 Cardiogenic shock: Secondary | ICD-10-CM

## 2015-04-24 LAB — CARBOXYHEMOGLOBIN
Carboxyhemoglobin: 1.4 % (ref 0.5–1.5)
Methemoglobin: 0.8 % (ref 0.0–1.5)
O2 Saturation: 58.4 %
TOTAL HEMOGLOBIN: 12.4 g/dL (ref 12.0–16.0)

## 2015-04-24 LAB — GLUCOSE, CAPILLARY
GLUCOSE-CAPILLARY: 112 mg/dL — AB (ref 65–99)
GLUCOSE-CAPILLARY: 117 mg/dL — AB (ref 65–99)
GLUCOSE-CAPILLARY: 106 mg/dL — AB (ref 65–99)
Glucose-Capillary: 114 mg/dL — ABNORMAL HIGH (ref 65–99)

## 2015-04-24 LAB — BASIC METABOLIC PANEL
Anion gap: 8 (ref 5–15)
BUN: 33 mg/dL — ABNORMAL HIGH (ref 6–20)
CALCIUM: 8.7 mg/dL — AB (ref 8.9–10.3)
CO2: 36 mmol/L — ABNORMAL HIGH (ref 22–32)
Chloride: 92 mmol/L — ABNORMAL LOW (ref 101–111)
Creatinine, Ser: 1.5 mg/dL — ABNORMAL HIGH (ref 0.44–1.00)
GFR calc Af Amer: 46 mL/min — ABNORMAL LOW (ref >60)
GFR calc non Af Amer: 40 mL/min — ABNORMAL LOW (ref >60)
GLUCOSE: 98 mg/dL (ref 65–99)
POTASSIUM: 4.4 mmol/L (ref 3.5–5.1)
SODIUM: 136 mmol/L (ref 135–145)

## 2015-04-24 MED ORDER — FUROSEMIDE 10 MG/ML IJ SOLN
20.0000 mg/h | INTRAMUSCULAR | Status: DC
  Administered 2015-04-24 – 2015-04-26 (×4): 20 mg/h via INTRAVENOUS
  Filled 2015-04-24 (×8): qty 25

## 2015-04-24 NOTE — Progress Notes (Signed)
Patient ID: [name], female   DOB: February 10, 1966, 49 y.o.   MRN: [id number] Advanced Heart Failure Rounding Note   Subjective:    Underwent RHC on 8/12 RA = 19 RV = 68/14/19 PA = 73/34 (53) PCW = 33 (v = 50) Fick cardiac output/index = 5.8/2.6 Thermo cardiac output/index = 5.8/2.6 PVR =4.0 WU FA sat = 98% PA sat = 66%, 70% RA/PCWP = 0.58  I/O -1.8L but weight unchanged. Remains on milrinone. Feels a bit better.   Denies CP, SOB, lightheadedness, or orthopnea.   Swan numbers done personally with RN at bedside CVP 16 PAP 66/18 (34) PCWP 32 (v to 50) - hard to measure mean due to size of v-waves Thermo 4.5/2.0 Co-ox 59%   Objective:   Weight Range:  Vital Signs:   Temp:  [96.8 F (36 C)-97.9 F (36.6 C)] 97.5 F (36.4 C) (08/13 0837) Pulse Rate:  [65-73] 70 (08/13 0800) Resp:  [0-21] 17 (08/13 0800) BP: (86-115)/(42-76) 86/64 mmHg (08/13 0800) SpO2:  [95 %-100 %] 99 % (08/13 0800) Weight:  [123.3 kg (271 lb 13.2 oz)] 123.3 kg (271 lb 13.2 oz) (08/13 0704) Last BM Date: 04/16/15  Weight change: Filed Weights   04/22/15 0500 04/23/15 0356 04/24/15 0704  Weight: 122.38 kg (269 lb 12.8 oz) 123.152 kg (271 lb 8 oz) 123.3 kg (271 lb 13.2 oz)    Intake/Output:   Intake/Output Summary (Last 24 hours) at 04/24/15 1236 Last data filed at 04/24/15 0700  Gross per 24 hour  Intake    806 ml  Output   2725 ml  Net  -1919 ml     Physical Exam: CVP: 16 General:  Obese female lying in bed. No resp difficulty HEENT: normal Neck: Thick, JVP hard to see. RIJ swan Carotids 2+ bilat; no bruits. No lymphadenopathy or thryomegaly appreciated. Cor: PMI nondisplaced. Regular rate & rhythm. No rubs, gallops or murmurs. Lungs: clear Abdomen: soft, nontender, +distended. No hepatosplenomegaly. No bruits or masses. Good bowel sounds. Extremities: no cyanosis, clubbing, rash.  1+ ankle edema.  Neuro: alert & orientedx3, cranial nerves grossly intact. moves all 4 extremities w/o  difficulty. Affect depressed  Telemetry: Sinus with BiV pacing 70s and occasional PVCs  Labs: Basic Metabolic Panel:  Recent Labs Lab 04/19/15 0400 04/20/15 0844 04/21/15 0437 04/22/15 0230 04/23/15 0359 04/23/15 1200 04/24/15 0510  NA 137 136 137 136 132*  --  136  K 4.1 4.2 3.8 3.8 4.0  --  4.4  CL 92* 93* 91* 87* 86*  --  92*  CO2 36* 35* 36* 37* 36*  --  36*  GLUCOSE 92 123* 116* 81 189*  --  98  BUN 29* 30* 33* 38* 39*  --  33*  CREATININE 1.37* 1.41* 1.55* 1.83* 1.90* 1.67* 1.50*  CALCIUM 8.8* 8.7* 8.7* 9.2 8.8*  --  8.7*  MG 2.1  --   --   --   --  2.4  --     Liver Function Tests: No results for input(s): AST, ALT, ALKPHOS, BILITOT, PROT, ALBUMIN in the last 168 hours. No results for input(s): LIPASE, AMYLASE in the last 168 hours. No results for input(s): AMMONIA in the last 168 hours.  CBC:  Recent Labs Lab 04/23/15 1200  WBC 4.3  HGB 12.9  HCT 40.1  MCV 95.7  PLT 210    Cardiac Enzymes: No results for input(s): CKTOTAL, CKMB, CKMBINDEX, TROPONINI in the last 168 hours.  BNP: BNP (last 3 results)  Recent Labs  04/15/15 0840 04/15/15 1953 04/16/15 0355  BNP 172.0* 159.1* 262.1*    ProBNP (last 3 results)  Recent Labs  08/04/14 2339 08/21/14 1117  PROBNP 1727.0* 171.9*      Other results:  Imaging: No results found.   Medications:     Scheduled Medications: . allopurinol  300 mg Oral Daily  . amiodarone  200 mg Oral BID  . aspirin EC  81 mg Oral Daily  . digoxin  0.0625 mg Oral Daily  . DULoxetine  30 mg Oral Daily  . enoxaparin (LOVENOX) injection  60 mg Subcutaneous Q24H  . ferrous sulfate  325 mg Oral TID WC  . furosemide  80 mg Intravenous BID  . gabapentin  600 mg Oral TID  . hydrALAZINE  25 mg Oral 3 times per day  . insulin aspart  0-20 Units Subcutaneous TID WC  . insulin detemir  58 Units Subcutaneous BID  . ivabradine  2.5 mg Oral BID WC  . Linaclotide  290 mcg Oral Daily  . magnesium oxide  400 mg Oral BID   . metolazone  5 mg Oral Daily  . pantoprazole  40 mg Oral Daily  . potassium chloride SA  60 mEq Oral BID  . ranolazine  500 mg Oral BID  . sildenafil  20 mg Oral TID  . sodium chloride  3 mL Intravenous Q12H  . sodium chloride  3 mL Intravenous Q12H  . sodium chloride  3 mL Intravenous Q12H  . spironolactone  25 mg Oral BID  . temazepam  30 mg Oral QHS    Infusions: . milrinone 0.375 mcg/kg/min (04/24/15 0641)    PRN Medications: sodium chloride, sodium chloride, sodium chloride, acetaminophen, albuterol, ALPRAZolam, bisacodyl, cyclobenzaprine, docusate sodium, guaiFENesin-dextromethorphan, ondansetron (ZOFRAN) IV, oxyCODONE-acetaminophen **AND** oxyCODONE, sodium chloride, sodium chloride, sodium chloride   Assessment/Plan    1. Acute/Chronic Systolic Heart Failure: Nonischemic cardiomyopathy, s/p [employer] CRT-D. EF 30-35% (03/2015). NYHA class IV symptoms on Milrinone  2. OSA: Using CPAP.  3. 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 Mag 2.1 Continue to 400 mg bid. Watch K and Mg closely.  4. H/O GI bleed with AVM.  No evidence of acute bleeding.  5. Pulmonary HTN: RHC 03/31/15 PVR 10. Improved on milrinone and sildenafil.  6. CKD: Follow creatinine carefully with diuresis, stable so far.   She is making very little progress despite inotropic support. Wither her obesity, noncompliance (was eating a Big Mac in the CCU a few days ago), RV dysfunction, h/o GI AVMs, I do not think she is VAD candidate. I considered switching over to dobutamine but PA pressures have responded well to milrinone so this is likely the better agent. I am going to start lasix gtt and see how she responds. May need Foley.   The patient is critically ill with multiple organ systems failure and requires high complexity decision making for assessment and support, frequent evaluation and titration of therapies, application of advanced monitoring technologies and  extensive interpretation of multiple databases.   Critical Care Time devoted to patient care services described in this note is 35 Minutes.   Length of Stay: 8  [name]  MD  04/24/2015, 12:36 PM  Advanced Heart Failure Team Pager [phone] (M-F; 7a - 4p)  Please contact [facility] for night-coverage after hours (4p -7a ) and weekends on [email or web address]

## 2015-04-25 LAB — BASIC METABOLIC PANEL
Anion gap: 10 (ref 5–15)
Anion gap: 11 (ref 5–15)
BUN: 32 mg/dL — ABNORMAL HIGH (ref 6–20)
BUN: 35 mg/dL — AB (ref 6–20)
CALCIUM: 9.1 mg/dL (ref 8.9–10.3)
CHLORIDE: 83 mmol/L — AB (ref 101–111)
CO2: 41 mmol/L — AB (ref 22–32)
CO2: 40 mmol/L — ABNORMAL HIGH (ref 22–32)
CREATININE: 1.6 mg/dL — AB (ref 0.44–1.00)
Calcium: 9 mg/dL (ref 8.9–10.3)
Chloride: 86 mmol/L — ABNORMAL LOW (ref 101–111)
Creatinine, Ser: 1.47 mg/dL — ABNORMAL HIGH (ref 0.44–1.00)
GFR calc Af Amer: 47 mL/min — ABNORMAL LOW (ref >60)
GFR calc non Af Amer: 37 mL/min — ABNORMAL LOW (ref >60)
GFR, EST AFRICAN AMERICAN: 43 mL/min — AB (ref >60)
GFR, EST NON AFRICAN AMERICAN: 41 mL/min — AB (ref >60)
GLUCOSE: 87 mg/dL (ref 65–99)
GLUCOSE: 106 mg/dL — AB (ref 65–99)
Potassium: 3.2 mmol/L — ABNORMAL LOW (ref 3.5–5.1)
Potassium: 4 mmol/L (ref 3.5–5.1)
SODIUM: 134 mmol/L — AB (ref 135–145)
Sodium: 137 mmol/L (ref 135–145)

## 2015-04-25 LAB — CARBOXYHEMOGLOBIN
Carboxyhemoglobin: 1.7 % — ABNORMAL HIGH (ref 0.5–1.5)
Methemoglobin: 1.3 % (ref 0.0–1.5)
O2 Saturation: 68 %
TOTAL HEMOGLOBIN: 12.7 g/dL (ref 12.0–16.0)

## 2015-04-25 LAB — GLUCOSE, CAPILLARY
GLUCOSE-CAPILLARY: 93 mg/dL (ref 65–99)
GLUCOSE-CAPILLARY: 95 mg/dL (ref 65–99)
GLUCOSE-CAPILLARY: 95 mg/dL (ref 65–99)
GLUCOSE-CAPILLARY: 101 mg/dL — AB (ref 65–99)

## 2015-04-25 MED ORDER — POTASSIUM CHLORIDE CRYS ER 20 MEQ PO TBCR
40.0000 meq | EXTENDED_RELEASE_TABLET | Freq: Once | ORAL | Status: AC
  Administered 2015-04-25: 40 meq via ORAL

## 2015-04-25 NOTE — Progress Notes (Signed)
Patient ID: [name], female   DOB: [birth year], 49 y.o.   MRN: [id number] Advanced Heart Failure Rounding Note   Subjective:    Remains on milrinone. Lasix drip started yesterday with good results. -4.5L Weight down 8 pounds. Co-ox improved to 68%. Renal function stable  Swan numbers done personally  CVP 13 PAP 56/24 (37) PCWP 24 Thermo 4.4/1.9 Co-ox 68% PVR 2.5 WU SVR 1137  Underwent RHC on 8/12 RA = 19 RV = 68/14/19 PA = 73/34 (53) PCW = 33 (v = 50) Fick cardiac output/index = 5.8/2.6 Thermo cardiac output/index = 5.8/2.6 PVR =4.0 WU FA sat = 98% PA sat = 66%, 70% RA/PCWP = 0.58  Objective:   Weight Range:  Vital Signs:   Temp:  [97 F (36.1 C)-99 F (37.2 C)] 97.5 F (36.4 C) (08/14 0900) Pulse Rate:  [38-95] 79 (08/14 0920) Resp:  [12-19] 13 (08/14 0900) BP: (47-122)/(33-82) 110/57 mmHg (08/14 0900) SpO2:  [93 %-100 %] 100 % (08/14 0900) Weight:  [119.1 kg (262 lb 9.1 oz)] 119.1 kg (262 lb 9.1 oz) (08/14 0500) Last BM Date: 04/16/15  Weight change: Filed Weights   04/23/15 0356 04/24/15 0704 04/25/15 0500  Weight: 123.152 kg (271 lb 8 oz) 123.3 kg (271 lb 13.2 oz) 119.1 kg (262 lb 9.1 oz)    Intake/Output:   Intake/Output Summary (Last 24 hours) at 04/25/15 0944 Last data filed at 04/25/15 0900  Gross per 24 hour  Intake 13061.86 ml  Output   4465 ml  Net 8596.86 ml     Physical Exam: CVP: 13 General:  Obese female lying in bed. No resp difficulty HEENT: normal Neck: Thick, JVP hard to see. RIJ swan Carotids 2+ bilat; no bruits. No lymphadenopathy or thryomegaly appreciated. Cor: PMI nondisplaced. Regular rate & rhythm. No rubs, gallops or murmurs. Lungs: clear Abdomen: soft, nontender, +distended. No hepatosplenomegaly. No bruits or masses. Good bowel sounds. Extremities: no cyanosis, clubbing, rash.  tr ankle edema.  Neuro: alert & orientedx3, cranial nerves grossly intact. moves all 4 extremities w/o difficulty. Affect  depressed  Telemetry: Sinus with BiV pacing 70s and frequent multifocal PVCs  Labs: Basic Metabolic Panel:  Recent Labs Lab 04/19/15 0400  04/21/15 0437 04/22/15 0230 04/23/15 0359 04/23/15 1200 04/24/15 0510 04/25/15 0615  NA 137  < > 137 136 132*  --  136 137  K 4.1  < > 3.8 3.8 4.0  --  4.4 3.2*  CL 92*  < > 91* 87* 86*  --  92* 86*  CO2 36*  < > 36* 37* 36*  --  36* 41*  GLUCOSE 92  < > 116* 81 189*  --  98 87  BUN 29*  < > 33* 38* 39*  --  33* 32*  CREATININE 1.37*  < > 1.55* 1.83* 1.90* 1.67* 1.50* 1.47*  CALCIUM 8.8*  < > 8.7* 9.2 8.8*  --  8.7* 9.0  MG 2.1  --   --   --   --  2.4  --   --   < > = values in this interval not displayed.  Liver Function Tests: No results for input(s): AST, ALT, ALKPHOS, BILITOT, PROT, ALBUMIN in the last 168 hours. No results for input(s): LIPASE, AMYLASE in the last 168 hours. No results for input(s): AMMONIA in the last 168 hours.  CBC:  Recent Labs Lab 04/23/15 1200  WBC 4.3  HGB 12.9  HCT 40.1  MCV 95.7  PLT 210    Cardiac Enzymes:  No results for input(s): CKTOTAL, CKMB, CKMBINDEX, TROPONINI in the last 168 hours.  BNP: BNP (last 3 results)  Recent Labs  04/15/15 0840 04/15/15 1953 04/16/15 0355  BNP 172.0* 159.1* 262.1*    ProBNP (last 3 results)  Recent Labs  08/04/14 2339 08/21/14 1117  PROBNP 1727.0* 171.9*      Other results:  Imaging: No results found.   Medications:     Scheduled Medications: . allopurinol  300 mg Oral Daily  . amiodarone  200 mg Oral BID  . aspirin EC  81 mg Oral Daily  . digoxin  0.0625 mg Oral Daily  . DULoxetine  30 mg Oral Daily  . enoxaparin (LOVENOX) injection  60 mg Subcutaneous Q24H  . ferrous sulfate  325 mg Oral TID WC  . gabapentin  600 mg Oral TID  . hydrALAZINE  25 mg Oral 3 times per day  . insulin aspart  0-20 Units Subcutaneous TID WC  . insulin detemir  58 Units Subcutaneous BID  . ivabradine  2.5 mg Oral BID WC  . Linaclotide  290 mcg Oral  Daily  . magnesium oxide  400 mg Oral BID  . metolazone  5 mg Oral Daily  . pantoprazole  40 mg Oral Daily  . potassium chloride SA  60 mEq Oral BID  . ranolazine  500 mg Oral BID  . sildenafil  20 mg Oral TID  . sodium chloride  3 mL Intravenous Q12H  . sodium chloride  3 mL Intravenous Q12H  . sodium chloride  3 mL Intravenous Q12H  . spironolactone  25 mg Oral BID  . temazepam  30 mg Oral QHS    Infusions: . furosemide (LASIX) infusion 20 mg (04/25/15 0800)  . milrinone 0.375 mcg/kg/min (04/25/15 0703)    PRN Medications: sodium chloride, sodium chloride, sodium chloride, acetaminophen, albuterol, ALPRAZolam, bisacodyl, cyclobenzaprine, docusate sodium, guaiFENesin-dextromethorphan, ondansetron (ZOFRAN) IV, oxyCODONE-acetaminophen **AND** oxyCODONE, sodium chloride, sodium chloride, sodium chloride   Assessment/Plan    1. Acute/Chronic Systolic Heart Failure: Nonischemic cardiomyopathy, s/p [employer] CRT-D. EF 30-35% (03/2015). NYHA class IV symptoms on Milrinone  2. OSA: Using CPAP.  3. 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 Mag 2.1 Continue to 400 mg bid. Watch K and Mg closely.  4. H/O GI bleed with AVM.  No evidence of acute bleeding.  5. Pulmonary HTN: RHC 03/31/15 PVR 10. Improved on milrinone and sildenafil.  6. CKD: Follow creatinine carefully with diuresis, stable so far.  7. Frequent PVCs:   Hemodynamics much improved with lasix gtt and milrinone - this is the best I have seen them in a long time. That said, with her obesity, noncompliance (was eating a Big Mac in the CCU a few days ago), RV dysfunction, h/o GI AVMs, I still think she may not be a  VAD candidate.   Frequent PVCs: Continue amio. supp K aggressively as she has VT with hypokalemia. Check mag.   Will continue lasix gtt and milrinone. Discuss at [facility] meeting tomorrow.  The patient is critically ill with multiple organ systems failure and requires high  complexity decision making for assessment and support, frequent evaluation and titration of therapies, application of advanced monitoring technologies and extensive interpretation of multiple databases.   Critical Care Time devoted to patient care services described in this note is 35 Minutes.   Length of Stay: 9  Barbara Meres  MD  04/25/2015, 9:44 AM  Advanced Heart Failure Team Pager [phone] (M-F; 7a -  4p)  Please contact [facility] for night-coverage after hours (4p -7a ) and weekends on [email or web address]

## 2015-04-26 LAB — GLUCOSE, CAPILLARY
GLUCOSE-CAPILLARY: 99 mg/dL (ref 65–99)
GLUCOSE-CAPILLARY: 85 mg/dL (ref 65–99)
Glucose-Capillary: 104 mg/dL — ABNORMAL HIGH (ref 65–99)
Glucose-Capillary: 100 mg/dL — ABNORMAL HIGH (ref 65–99)

## 2015-04-26 LAB — BASIC METABOLIC PANEL
Anion gap: 13 (ref 5–15)
BUN: 36 mg/dL — AB (ref 6–20)
CALCIUM: 8.8 mg/dL — AB (ref 8.9–10.3)
CO2: 37 mmol/L — ABNORMAL HIGH (ref 22–32)
CREATININE: 1.75 mg/dL — AB (ref 0.44–1.00)
Chloride: 82 mmol/L — ABNORMAL LOW (ref 101–111)
GFR calc Af Amer: 38 mL/min — ABNORMAL LOW (ref >60)
GFR, EST NON AFRICAN AMERICAN: 33 mL/min — AB (ref >60)
GLUCOSE: 181 mg/dL — AB (ref 65–99)
Potassium: 3.4 mmol/L — ABNORMAL LOW (ref 3.5–5.1)
Sodium: 132 mmol/L — ABNORMAL LOW (ref 135–145)

## 2015-04-26 LAB — CARBOXYHEMOGLOBIN
CARBOXYHEMOGLOBIN: 1.7 % — AB (ref 0.5–1.5)
METHEMOGLOBIN: 1.4 % (ref 0.0–1.5)
O2 Saturation: 73.2 %
TOTAL HEMOGLOBIN: 16 g/dL (ref 12.0–16.0)

## 2015-04-26 LAB — MAGNESIUM: Magnesium: 2 mg/dL (ref 1.7–2.4)

## 2015-04-26 MED ORDER — SILDENAFIL CITRATE 20 MG PO TABS
40.0000 mg | ORAL_TABLET | Freq: Three times a day (TID) | ORAL | Status: DC
  Administered 2015-04-26 – 2015-04-28 (×7): 40 mg via ORAL
  Filled 2015-04-26 (×8): qty 2

## 2015-04-26 MED ORDER — TORSEMIDE 100 MG PO TABS
100.0000 mg | ORAL_TABLET | Freq: Two times a day (BID) | ORAL | Status: DC
  Administered 2015-04-26 – 2015-04-28 (×5): 100 mg via ORAL
  Filled 2015-04-26 (×8): qty 1

## 2015-04-26 MED ORDER — POTASSIUM CHLORIDE CRYS ER 20 MEQ PO TBCR
40.0000 meq | EXTENDED_RELEASE_TABLET | Freq: Once | ORAL | Status: AC
  Administered 2015-04-26: 40 meq via ORAL

## 2015-04-26 MED ORDER — METOLAZONE 2.5 MG PO TABS: 2.5000 mg | ORAL_TABLET | Freq: Once | ORAL | Status: DC

## 2015-04-26 MED FILL — Heparin Sodium (Porcine) 2 Unit/ML in Sodium Chloride 0.9%: INTRAMUSCULAR | Qty: 1000 | Status: AC

## 2015-04-26 NOTE — Progress Notes (Signed)
Patient ID: [name], female   DOB: 22-Nov-1965, 49 y.o.   MRN: [id number] Advanced Heart Failure Rounding Note   Subjective:    Remains on milrinone at 0.375 and Lasix gtt at 20.  Good diuresis yesterday.  Feels good.   Ernestine Conrad numbers done personally  CVP 4 PAP 56/19 PCWP 20 Thermo 2.1 Co-ox 73%   Underwent RHC on 8/12 RA = 19 RV = 68/14/19 PA = 73/34 (53) PCW = 33 (v = 50) Fick cardiac output/index = 5.8/2.6 Thermo cardiac output/index = 5.8/2.6 PVR =4.0 WU FA sat = 98% PA sat = 66%, 70% RA/PCWP = 0.58  Objective:   Weight Range:  Vital Signs:   Temp:  [97.2 F (36.2 C)-98.4 F (36.9 C)] 97.7 F (36.5 C) (08/15 0600) Pulse Rate:  [38-95] 38 (08/15 0600) Resp:  [13-19] 19 (08/15 0600) BP: (69-123)/(41-73) 101/50 mmHg (08/15 0612) SpO2:  [91 %-100 %] 98 % (08/15 0600) Weight:  [262 lb 12.6 oz (119.2 kg)] 262 lb 12.6 oz (119.2 kg) (08/15 0500) Last BM Date: 04/16/15  Weight change: Filed Weights   04/24/15 0704 04/25/15 0500 04/26/15 0500  Weight: 271 lb 13.2 oz (123.3 kg) 262 lb 9.1 oz (119.1 kg) 262 lb 12.6 oz (119.2 kg)    Intake/Output:   Intake/Output Summary (Last 24 hours) at 04/26/15 0865 Last data filed at 04/26/15 7846  Gross per 24 hour  Intake   1932 ml  Output   3600 ml  Net  -1668 ml     Physical Exam: CVP: 4 General:  Obese female lying in bed. No resp difficulty HEENT: normal Neck: Thick, JVP hard to see. RIJ swan Carotids 2+ bilat; no bruits. No lymphadenopathy or thryomegaly appreciated. Cor: PMI nondisplaced. Regular rate & rhythm. No rubs, gallops or murmurs. Lungs: clear Abdomen: soft, nontender, +distended. No hepatosplenomegaly. No bruits or masses. Good bowel sounds. Extremities: no cyanosis, clubbing, rash. No edema.  Neuro: alert & orientedx3, cranial nerves grossly intact. moves all 4 extremities w/o difficulty. Affect depressed  Telemetry: Sinus with BiV pacing 70s and frequent multifocal PVCs  Labs: Basic Metabolic  Panel:  Recent Labs Lab 04/23/15 0359 04/23/15 1200 04/24/15 0510 04/25/15 0615 04/25/15 1548 04/26/15 0440  NA 132*  --  136 137 134* 132*  K 4.0  --  4.4 3.2* 4.0 3.4*  CL 86*  --  92* 86* 83* 82*  CO2 36*  --  36* 41* 40* 37*  GLUCOSE 189*  --  98 87 106* 181*  BUN 39*  --  33* 32* 35* 36*  CREATININE 1.90* 1.67* 1.50* 1.47* 1.60* 1.75*  CALCIUM 8.8*  --  8.7* 9.0 9.1 8.8*  MG  --  2.4  --   --   --  2.0    Liver Function Tests: No results for input(s): AST, ALT, ALKPHOS, BILITOT, PROT, ALBUMIN in the last 168 hours. No results for input(s): LIPASE, AMYLASE in the last 168 hours. No results for input(s): AMMONIA in the last 168 hours.  CBC:  Recent Labs Lab 04/23/15 1200  WBC 4.3  HGB 12.9  HCT 40.1  MCV 95.7  PLT 210    Cardiac Enzymes: No results for input(s): CKTOTAL, CKMB, CKMBINDEX, TROPONINI in the last 168 hours.  BNP: BNP (last 3 results)  Recent Labs  04/15/15 0840 04/15/15 1953 04/16/15 0355  BNP 172.0* 159.1* 262.1*    ProBNP (last 3 results)  Recent Labs  08/04/14 2339 08/21/14 1117  PROBNP 1727.0* 171.9*  Other results:  Imaging: No results found.   Medications:     Scheduled Medications: . allopurinol  300 mg Oral Daily  . amiodarone  200 mg Oral BID  . aspirin EC  81 mg Oral Daily  . digoxin  0.0625 mg Oral Daily  . DULoxetine  30 mg Oral Daily  . enoxaparin (LOVENOX) injection  60 mg Subcutaneous Q24H  . ferrous sulfate  325 mg Oral TID WC  . gabapentin  600 mg Oral TID  . hydrALAZINE  25 mg Oral 3 times per day  . insulin aspart  0-20 Units Subcutaneous TID WC  . insulin detemir  58 Units Subcutaneous BID  . ivabradine  2.5 mg Oral BID WC  . Linaclotide  290 mcg Oral Daily  . magnesium oxide  400 mg Oral BID  . metolazone  2.5 mg Oral Once  . pantoprazole  40 mg Oral Daily  . potassium chloride  40 mEq Oral Once  . potassium chloride SA  60 mEq Oral BID  . ranolazine  500 mg Oral BID  . sildenafil  40  mg Oral TID  . sodium chloride  3 mL Intravenous Q12H  . sodium chloride  3 mL Intravenous Q12H  . sodium chloride  3 mL Intravenous Q12H  . spironolactone  25 mg Oral BID  . temazepam  30 mg Oral QHS  . torsemide  100 mg Oral BID    Infusions: . milrinone 0.375 mcg/kg/min (04/26/15 0621)    PRN Medications: sodium chloride, sodium chloride, sodium chloride, acetaminophen, albuterol, ALPRAZolam, bisacodyl, cyclobenzaprine, docusate sodium, guaiFENesin-dextromethorphan, ondansetron (ZOFRAN) IV, oxyCODONE-acetaminophen **AND** oxyCODONE, sodium chloride, sodium chloride, sodium chloride   Assessment/Plan    1. Acute/Chronic Systolic Heart Failure: Nonischemic cardiomyopathy, s/p [employer] CRT-D. EF 30-35% (03/2015). NYHA class IV symptoms on Milrinone gtt at 0.375.  Hemodynamics much better overall.  That said, with her obesity, noncompliance (was eating a Big Mac in the CCU a few days ago), and h/o GI AVMs, she is a difficult candidate for LVAD.  I do not think that her RV would be a limiting factor, however.   - Can remove Swan today, continue CVP monitoring via CVL.  - Stop Lasix gtt, will start torsemide 100 mg bid.  Will likely need more frequent metolazone dosing at home but will have to follow K closely.  - Continue current milrinone, hydralazine, spironolactone, digoxin.  2. OSA: Using CPAP.  3. 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  Still with frequent PVCs.  Replete K today.  4. H/O GI bleed with AVM.  No evidence of acute bleeding.  5. Pulmonary HTN: RHC 03/31/15 PVR 10. Improved on milrinone and sildenafil, PVR 4 on 8/12 RHC.  Can increase Revatio to 40 mg tid.  6. CKD: Follow creatinine carefully with diuresis, mild elevation today.  7. Frequent PVCs   Length of Stay: 10  [name]  MD  04/26/2015, 7:22 AM  Advanced Heart Failure Team Pager [phone] (M-F; 7a - 4p)  Please contact [facility] for  night-coverage after hours (4p -7a ) and weekends on [email or web address]

## 2015-04-26 NOTE — Care Management Important Message (Signed)
Important Message  Patient Details  Name: [name] MRN: [id number] Date of Birth: 08-May-1966   Medicare Important Message Given:  Yes-fourth notification given    [name] 04/26/2015, 2:51 PM

## 2015-04-27 ENCOUNTER — Other Ambulatory Visit (HOSPITAL_COMMUNITY): Payer: Self-pay | Admitting: *Deleted

## 2015-04-27 DIAGNOSIS — N179 Acute kidney failure, unspecified: Secondary | ICD-10-CM

## 2015-04-27 DIAGNOSIS — E119 Type 2 diabetes mellitus without complications: Secondary | ICD-10-CM

## 2015-04-27 LAB — BASIC METABOLIC PANEL
Anion gap: 10 (ref 5–15)
BUN: 44 mg/dL — ABNORMAL HIGH (ref 6–20)
CALCIUM: 8.7 mg/dL — AB (ref 8.9–10.3)
CO2: 37 mmol/L — AB (ref 22–32)
CREATININE: 2.02 mg/dL — AB (ref 0.44–1.00)
Chloride: 82 mmol/L — ABNORMAL LOW (ref 101–111)
GFR calc non Af Amer: 28 mL/min — ABNORMAL LOW (ref >60)
GFR, EST AFRICAN AMERICAN: 32 mL/min — AB (ref >60)
Glucose, Bld: 179 mg/dL — ABNORMAL HIGH (ref 65–99)
Potassium: 3.8 mmol/L (ref 3.5–5.1)
SODIUM: 129 mmol/L — AB (ref 135–145)

## 2015-04-27 LAB — CBC
HCT: 40.2 % (ref 36.0–46.0)
Hemoglobin: 13.1 g/dL (ref 12.0–15.0)
MCH: 30.8 pg (ref 26.0–34.0)
MCHC: 32.6 g/dL (ref 30.0–36.0)
MCV: 94.6 fL (ref 78.0–100.0)
PLATELETS: 196 10*3/uL (ref 150–400)
RBC: 4.25 MIL/uL (ref 3.87–5.11)
RDW: 15.1 % (ref 11.5–15.5)
WBC: 4.3 10*3/uL (ref 4.0–10.5)

## 2015-04-27 LAB — GLUCOSE, CAPILLARY
GLUCOSE-CAPILLARY: 127 mg/dL — AB (ref 65–99)
GLUCOSE-CAPILLARY: 157 mg/dL — AB (ref 65–99)
GLUCOSE-CAPILLARY: 90 mg/dL (ref 65–99)
GLUCOSE-CAPILLARY: 96 mg/dL (ref 65–99)

## 2015-04-27 LAB — CARBOXYHEMOGLOBIN
CARBOXYHEMOGLOBIN: 1.5 % (ref 0.5–1.5)
Methemoglobin: 1.5 % (ref 0.0–1.5)
O2 SAT: 65.7 %
Total hemoglobin: 13.4 g/dL (ref 12.0–16.0)

## 2015-04-27 MED ORDER — HYDRALAZINE HCL 25 MG PO TABS
12.5000 mg | ORAL_TABLET | Freq: Three times a day (TID) | ORAL | Status: DC
  Administered 2015-04-27 – 2015-04-28 (×3): 12.5 mg via ORAL
  Filled 2015-04-27: qty 0.5
  Filled 2015-04-27: qty 1
  Filled 2015-04-27 (×2): qty 0.5
  Filled 2015-04-27 (×2): qty 1

## 2015-04-27 MED ORDER — WARFARIN SODIUM 5 MG PO TABS
5.0000 mg | ORAL_TABLET | Freq: Once | ORAL | Status: AC
Start: 2015-04-27 — End: 2015-04-27
  Administered 2015-04-27: 5 mg via ORAL
  Filled 2015-04-27: qty 1

## 2015-04-27 MED ORDER — WARFARIN - PHARMACIST DOSING INPATIENT
Freq: Every day | Status: DC
  Administered 2015-04-27: 18:00:00

## 2015-04-27 NOTE — Progress Notes (Signed)
Patient ID: [name], female   DOB: 03/05/1966, 49 y.o.   MRN: [id number] Advanced Heart Failure Rounding Note   Subjective:    Remains on milrinone at 0.375, transition to torsemide yesterday.  Not walking much.  SBP 90s.  CVP 10 this morning, creatinine up to 2.    Underwent RHC on 8/12 RA = 19 RV = 68/14/19 PA = 73/34 (53) PCW = 33 (v = 50) Fick cardiac output/index = 5.8/2.6 Thermo cardiac output/index = 5.8/2.6 PVR =4.0 WU FA sat = 98% PA sat = 66%, 70% RA/PCWP = 0.58  Objective:   Weight Range:  Vital Signs:   Temp:  [97.5 F (36.4 C)-98.3 F (36.8 C)] 97.7 F (36.5 C) (08/16 0400) Pulse Rate:  [41-74] 69 (08/16 0600) Resp:  [17-20] 17 (08/15 2200) BP: (90-152)/(35-66) 93/52 mmHg (08/16 0600) SpO2:  [81 %-100 %] 96 % (08/16 0600) Weight:  [264 lb 8 oz (119.976 kg)] 264 lb 8 oz (119.976 kg) (08/16 0500) Last BM Date: 04/16/15  Weight change: Filed Weights   04/25/15 0500 04/26/15 0500 04/27/15 0500  Weight: 262 lb 9.1 oz (119.1 kg) 262 lb 12.6 oz (119.2 kg) 264 lb 8 oz (119.976 kg)    Intake/Output:   Intake/Output Summary (Last 24 hours) at 04/27/15 0754 Last data filed at 04/27/15 0600  Gross per 24 hour  Intake   1872 ml  Output   3325 ml  Net  -1453 ml     Physical Exam: CVP: 10 (sitting in chair) General:  Obese female. No resp difficulty HEENT: normal Neck: Thick, JVP hard to see, estimate 8 cm. RIJ CVL.  Cor: PMI nondisplaced. Regular rate & rhythm. No rubs, gallops or murmurs. Lungs: clear Abdomen: soft, nontender, +distended. No hepatosplenomegaly. No bruits or masses. Good bowel sounds. Extremities: no cyanosis, clubbing, rash. No edema.  Neuro: alert & orientedx3, cranial nerves grossly intact. moves all 4 extremities w/o difficulty. Affect depressed  Telemetry: Sinus with BiV pacing 70s and frequent PVCs  Labs: Basic Metabolic Panel:  Recent Labs Lab 04/23/15 1200 04/24/15 0510 04/25/15 0615 04/25/15 1548 04/26/15 0440  04/27/15 0533  NA  --  136 137 134* 132* 129*  K  --  4.4 3.2* 4.0 3.4* 3.8  CL  --  92* 86* 83* 82* 82*  CO2  --  36* 41* 40* 37* 37*  GLUCOSE  --  98 87 106* 181* 179*  BUN  --  33* 32* 35* 36* 44*  CREATININE 1.67* 1.50* 1.47* 1.60* 1.75* 2.02*  CALCIUM  --  8.7* 9.0 9.1 8.8* 8.7*  MG 2.4  --   --   --  2.0  --     Liver Function Tests: No results for input(s): AST, ALT, ALKPHOS, BILITOT, PROT, ALBUMIN in the last 168 hours. No results for input(s): LIPASE, AMYLASE in the last 168 hours. No results for input(s): AMMONIA in the last 168 hours.  CBC:  Recent Labs Lab 04/23/15 1200 04/27/15 0533  WBC 4.3 4.3  HGB 12.9 13.1  HCT 40.1 40.2  MCV 95.7 94.6  PLT 210 196    Cardiac Enzymes: No results for input(s): CKTOTAL, CKMB, CKMBINDEX, TROPONINI in the last 168 hours.  BNP: BNP (last 3 results)  Recent Labs  04/15/15 0840 04/15/15 1953 04/16/15 0355  BNP 172.0* 159.1* 262.1*    ProBNP (last 3 results)  Recent Labs  08/04/14 2339 08/21/14 1117  PROBNP 1727.0* 171.9*      Other results:  Imaging: No results found.  Medications:     Scheduled Medications: . allopurinol  300 mg Oral Daily  . amiodarone  200 mg Oral BID  . digoxin  0.0625 mg Oral Daily  . DULoxetine  30 mg Oral Daily  . enoxaparin (LOVENOX) injection  60 mg Subcutaneous Q24H  . ferrous sulfate  325 mg Oral TID WC  . gabapentin  600 mg Oral TID  . hydrALAZINE  12.5 mg Oral 3 times per day  . insulin aspart  0-20 Units Subcutaneous TID WC  . insulin detemir  58 Units Subcutaneous BID  . ivabradine  2.5 mg Oral BID WC  . Linaclotide  290 mcg Oral Daily  . magnesium oxide  400 mg Oral BID  . pantoprazole  40 mg Oral Daily  . potassium chloride SA  60 mEq Oral BID  . ranolazine  500 mg Oral BID  . sildenafil  40 mg Oral TID  . sodium chloride  3 mL Intravenous Q12H  . sodium chloride  3 mL Intravenous Q12H  . sodium chloride  3 mL Intravenous Q12H  . spironolactone  25 mg  Oral BID  . temazepam  30 mg Oral QHS  . torsemide  100 mg Oral BID    Infusions: . milrinone 0.375 mcg/kg/min (04/27/15 0529)    PRN Medications: sodium chloride, sodium chloride, sodium chloride, acetaminophen, albuterol, ALPRAZolam, bisacodyl, cyclobenzaprine, docusate sodium, guaiFENesin-dextromethorphan, ondansetron (ZOFRAN) IV, oxyCODONE-acetaminophen **AND** oxyCODONE, sodium chloride, sodium chloride, sodium chloride   Assessment/Plan    1. Acute/Chronic Systolic Heart Failure: Nonischemic cardiomyopathy, s/p [employer] CRT-D. EF 30-35% (03/2015). NYHA class IV symptoms on Milrinone gtt at 0.375.  Hemodynamics much better overall.  That said, with her obesity, noncompliance (was eating a Big Mac in the CCU a few days ago), and h/o GI AVMs, she is a difficult candidate for LVAD.  I do not think that her RV would be a limiting factor, however.  CVP 10 today with good diuresis yesterday.  Creatinine up to 2.   - Creatinine up today, she was transitioned to po torsemide yesterday so will watch on current dose of diuretic.  - Continue current milrinone, spironolactone, digoxin.  - With soft BP, decrease hydralazine to 12.5 mg tid.  - Long talk with patient today regarding LVAD.  I do not think she will do well long-term with milrinone with history of VT/VF.  We talked about the need for working on weight loss/diet.  Another major concern is her ability to tolerate coumadin if an LVAD were to be placed.  Discussed at [facility] with Dr Donata Clay yesterday => favor trial of coumadin with INR 2-2.5 to make sure that she does not develop GI bleeding.  Will stop ASA 81 for now.  2. OSA: Using CPAP.  3. 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  Still with frequent PVCs.  Replete K today.  4. H/O GI bleed with AVM.  No evidence of acute bleeding.  5. Pulmonary HTN: RHC 03/31/15 PVR 10. Improved on milrinone and sildenafil, PVR 4 on 8/12 RHC.   Continue Revatio 40 mg tid.  6. CKD: Follow creatinine carefully with diuresis, mild elevation today. Now on po diuretics.  7. Deconditioning: Needs to get out of bed and ambulate today, will have cardiac rehab see her.   Can go to telemetry, possible discharge tomorrow if doing well.   Length of Stay: 11  [name]  MD  04/27/2015, 7:54 AM  Advanced Heart Failure Team Pager  [phone] (M-F; 7a - 4p)  Please contact [facility] for night-coverage after hours (4p -7a ) and weekends on [email or web address]

## 2015-04-27 NOTE — Progress Notes (Signed)
R IJ Cordis d/c'd per MD order. Pressure held for with no signs of bleeding noted and patient on 15 min of bedrest s/p procedure. Site C/D/I.

## 2015-04-27 NOTE — Progress Notes (Signed)
VAD coordinator met with patient to discuss completion of VAD evaluation and MRB recommendations re: LVAD.  We discussed warfarin and INR follow up locally; VAD coordinator will locate nearby lab for patient and fax orders for standing INR. VAD team will manage INR.  Talked at length about weight loss and dietary compliance for heart failure. Referral made to [place].  Pt identified potential issues with her 24/7 caregiver with no alternative caregiver available. Will ask Raquel Sarna, LSW to discuss with patient.   She will need to complete Palliative care consult for VAD evaluation; VAD coordinator contacted Sharolyn Douglas, NP.

## 2015-04-27 NOTE — Progress Notes (Addendum)
Pt transferred from [facility]. Pt brought to the floor in stable condition. Vitals taken. All immediate pertinent needs to patient addressed. Patient Guide given to patient. Important safety instructions relating to hospitalization reviewed with patient. Patient verbalized understanding. Will continue to monitor pt.  Pt AOx4. Pt instructed of high risk fall potential while hospitalized. Pt refused bed alarm precaution and wishes to "get up ad lib" to restroom with risk. Will continue to utilize other fall risk precautions with pt.    Jilda Panda RN

## 2015-04-27 NOTE — Progress Notes (Signed)
CARDIAC REHAB PHASE I   PRE:  Rate/Rhythm: 79 paced  BP:  Supine:   Sitting: 113/49  Standing:    SaO2: 96% 4L   91%RA  MODE:  Ambulation: 450 ft in six minutes and did not stop to rest .   550 ft total walk  POST:  Rate/Rhythm: 90 paced  BP:  Supine:   Sitting: 139/53  Standing:    SaO2: 86%RA   92% 2L to 94%   SATURATION QUALIFICATIONS: (This note is used to comply with regulatory documentation for home oxygen)  Patient Saturations on Room Air at Rest = 90-91%  Patient Saturations on Room Air while Ambulating = 86%  Patient Saturations on 2 Liters of oxygen while Ambulating = 92%  Please briefly explain why patient needs home oxygen: desat walking  1140-1220 Pt walked 450 ft in with gait belt use and rolling walker on RA with steady gait. Did not need to rest. Followed with recliner and pt did not get weak or have to sit. Did state she felt a little SOB during walk. Desat to 86 % on RA and 2L kept sats above 90%. Pt stated she had back pain that was not uncommon for her. Tolerated well and had no buckling of knees. To recliner after walk. Pt walked another 100 ft after 6 mins to go to room so total of 550 ft.   Luetta Nutting, RN BSN  04/27/2015 12:12 PM

## 2015-04-27 NOTE — Progress Notes (Signed)
ANTICOAGULATION CONSULT NOTE - Initial Consult  Pharmacy Consult for Warfarin Indication: Cardiomyopathy / trial preVAD  Allergies  Allergen Reactions  . Cymbalta [Duloxetine Hcl] Other (See Comments)    Only in a mild dose pt can tolerate  . Trazodone And Nefazodone Other (See Comments)    Nightmares   . Lyrica [Pregabalin] Swelling    Patient Measurements: Height: 5\' 5"  (165.1 cm) Weight: 264 lb 8 oz (119.976 kg) IBW/kg (Calculated) : 57   Vital Signs: Temp: 97.7 F (36.5 C) (08/16 0400) Temp Source: Oral (08/16 0400) BP: 93/52 mmHg (08/16 0600) Pulse Rate: 69 (08/16 0600)  Labs:  Recent Labs  04/25/15 1548 04/26/15 0440 04/27/15 0533  HGB  --   --  13.1  HCT  --   --  40.2  PLT  --   --  196  CREATININE 1.60* 1.75* 2.02*    Estimated Creatinine Clearance: 43.7 mL/min (by C-G formula based on Cr of 2.02).   Medical History: Past Medical History  Diagnosis Date  . CHF (congestive heart failure)     a. EF 30-35%, RV mildly dilated (difficult study 11/2013) b. RHC (12/2013): RA 38/37 (35), RV 90/28, PA 95/51 (71), PCWP 54, PA 40%, AO 96 %, CO/CI Fick 3.2/1.4, PVR 5.3   . Nonischemic cardiomyopathy 12/2013    a. LHC (12/2013) normal coronary anatomy   . Mitral regurgitation     moderate to severe  . LBBB (left bundle branch block)   . HTN (hypertension)   . Asthma   . IBS (irritable bowel syndrome)     with primarily constipation  . Morbid obesity   . GERD (gastroesophageal reflux disease)   . IDA (iron deficiency anemia)     2o TO SB AVMS, Hx parenteral iron Dr Mariel Sleet  . AVM (arteriovenous malformation)     Small bowel, s/p duble balloon enteroscopy/APC jejunum Dr Gwinda Passe [facility]) 01/23/2011  . Peripheral neuropathy   . OSA (obstructive sleep apnea)     on CPAP qhs  . Diabetes mellitus without complication   . Torsades de pointes     a. appropriate ICD therapy 12/2014 in setting of hypokalemia  . Pneumonia       Assessment: 49yof HX NICM EF 35%  and on home milrinone  admitted with HF exacerbation. With low EF and work up for LVAD - trial to see if she tolerates warfarin d/t Hx GIB.   BL INR 1.13 CBC stable  Goal of Therapy:  INR 2-2.5 Monitor platelets by anticoagulation protocol: Yes   Plan:  Warfarin 5mg  x1 today Daily Protime  Leota Sauers Pharm.D. CPP, BCPS Clinical Pharmacist [phone] 04/27/2015 8:27 AM

## 2015-04-27 NOTE — Progress Notes (Signed)
Report called to RN [id number]. Patient with no complaints at the current time. Will Transfer via WC.

## 2015-04-28 ENCOUNTER — Encounter: Payer: Self-pay | Admitting: *Deleted

## 2015-04-28 DIAGNOSIS — Z515 Encounter for palliative care: Secondary | ICD-10-CM

## 2015-04-28 DIAGNOSIS — Z7189 Other specified counseling: Secondary | ICD-10-CM

## 2015-04-28 LAB — CBC
HEMATOCRIT: 40.9 % (ref 36.0–46.0)
HEMOGLOBIN: 13.4 g/dL (ref 12.0–15.0)
MCH: 30.6 pg (ref 26.0–34.0)
MCHC: 32.8 g/dL (ref 30.0–36.0)
MCV: 93.4 fL (ref 78.0–100.0)
Platelets: 211 10*3/uL (ref 150–400)
RBC: 4.38 MIL/uL (ref 3.87–5.11)
RDW: 14.9 % (ref 11.5–15.5)
WBC: 4 10*3/uL (ref 4.0–10.5)

## 2015-04-28 LAB — GLUCOSE, CAPILLARY
GLUCOSE-CAPILLARY: 123 mg/dL — AB (ref 65–99)
GLUCOSE-CAPILLARY: 106 mg/dL — AB (ref 65–99)

## 2015-04-28 LAB — BASIC METABOLIC PANEL
Anion gap: 14 (ref 5–15)
BUN: 51 mg/dL — AB (ref 6–20)
CO2: 34 mmol/L — ABNORMAL HIGH (ref 22–32)
Calcium: 8.8 mg/dL — ABNORMAL LOW (ref 8.9–10.3)
Chloride: 84 mmol/L — ABNORMAL LOW (ref 101–111)
Creatinine, Ser: 2.07 mg/dL — ABNORMAL HIGH (ref 0.44–1.00)
GFR, EST AFRICAN AMERICAN: 31 mL/min — AB (ref >60)
GFR, EST NON AFRICAN AMERICAN: 27 mL/min — AB (ref >60)
Glucose, Bld: 107 mg/dL — ABNORMAL HIGH (ref 65–99)
Potassium: 3.4 mmol/L — ABNORMAL LOW (ref 3.5–5.1)
Sodium: 132 mmol/L — ABNORMAL LOW (ref 135–145)

## 2015-04-28 LAB — PROTIME-INR
INR: 1.14 (ref 0.00–1.49)
Prothrombin Time: 14.8 seconds (ref 11.6–15.2)

## 2015-04-28 MED ORDER — POTASSIUM CHLORIDE CRYS ER 20 MEQ PO TBCR
40.0000 meq | EXTENDED_RELEASE_TABLET | Freq: Once | ORAL | Status: AC
  Administered 2015-04-28: 40 meq via ORAL

## 2015-04-28 MED ORDER — METOLAZONE 2.5 MG PO TABS: ORAL_TABLET | ORAL | Status: DC

## 2015-04-28 MED ORDER — WARFARIN SODIUM 5 MG PO TABS: 5.0000 mg | ORAL_TABLET | Freq: Every day | ORAL | Status: DC

## 2015-04-28 MED ORDER — POTASSIUM CHLORIDE CRYS ER 20 MEQ PO TBCR: EXTENDED_RELEASE_TABLET | ORAL | Status: DC

## 2015-04-28 MED ORDER — MILRINONE IN DEXTROSE 20 MG/100ML IV SOLN: 0.3750 ug/kg/min | INTRAVENOUS | Status: AC

## 2015-04-28 MED ORDER — HYDRALAZINE HCL 25 MG PO TABS: 12.5000 mg | ORAL_TABLET | Freq: Three times a day (TID) | ORAL | Status: AC

## 2015-04-28 MED ORDER — SILDENAFIL CITRATE 20 MG PO TABS: 40.0000 mg | ORAL_TABLET | Freq: Three times a day (TID) | ORAL | Status: DC

## 2015-04-28 MED ORDER — DOCUSATE SODIUM 100 MG PO CAPS: 100.0000 mg | ORAL_CAPSULE | Freq: Two times a day (BID) | ORAL | Status: AC | PRN

## 2015-04-28 NOTE — Progress Notes (Signed)
Patient is arranged with [facility] by previous CM for [facility]; Jeri Modena RN with [facility] is aware of discharge for today. Abelino Derrick [facility] [phone]

## 2015-04-28 NOTE — Discharge Summary (Signed)
Advanced Heart Failure Team  Discharge Summary   Patient ID: [name] MRN: [id number], DOB/AGE: 09/11/1966 49 y.o. Admit date: 04/15/2015 D/C date:     04/28/2015   Primary Discharge Diagnoses:  1. A/C Systolic Heart Failure: NICM , [employer] CRT-D, on chronic milrinone 0.375 mcg 2. OSA: CPAP 3. Chronic Respiratory Failure: Requiring 2 liters oxygen 4. H/O VT/VF with ICD resuscitation (03/2015) : On Amio + Ranexa 5. H/O GI bleed with AVM 6. Pulmonary HTN: RHC 03/31/2015 PVR 10. On milrinone + revatio 7. CKD 8. Deconditioning  9. DMII   Hospital Course:   Ms. Klutts is a 49 yo woman with morbidity obesity, T2DM, hypertension, NICM with CRT-D (BS), OSA on CPAP with multiple recent admissions most recently 7/15- 7/28 with VT/VF with multiple ICD shocks and cardiogenic shock requiring milrinone admitted with volume overload.   Diuresed with IV lasix and later taken to cath lab for to further assess hemodynamics. As noted below RHC showed elevated filling pressures and reduced PVR compared to previous RHC. She continued to diurese with IV lasix. Once optimized she transitioned to torsemide 100 mg twice a day + metolazone twice weekly.  She continued on milrinone 0.375 mcg.  Overall she diuresed 8 pounds.   While hospsitalized she was evaluated by palliative care, social work, and VAD coordinator for possible LVAD.  She was discussed at LVAD MRB with recommendations to start coumadin to see if she can tolerate with history of GI bleed. [facility] will continue to follow for home milrinone, weekly BMET and INR.   Prior to discharge, [facility] notified of plan and need for close follow up.   1. Acute/Chronic Systolic Heart Failure: Nonischemic cardiomyopathy, s/p [employer] CRT-D. EF 30-35% (03/2015). NYHA class IV symptoms on Milrinone gtt at 0.375. and h/o GI AVMs, she is a difficult candidate for LVAD. RV not thought to be a limiting factor.   Creatinine unchanged in the last 24 hours  . Creatinine today 2.0  Volume appear stable.  - Continue current milrinone, spironolactone, digoxin.  - Continue hydralazine 12.5 mg tid.  - Concern for long term milrinone with history of VT/VF. She needs to lose weight. Another major concern is her ability to tolerate coumadin if an LVAD were to be placed. Discussed at [facility] with Dr Donata Clay => favor trial of coumadin with INR 2-2.5 to make sure that she does not develop GI bleeding. Will stop ASA 81 for now.  2. OSA: Using CPAP.  3. 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 Still with frequent PVCs. 4. H/O GI bleed with AVM. No evidence of acute bleeding.Starting coumadin to see if she can tolerate.  5. Pulmonary HTN: RHC 04/23/2015  PVR 4.  Improved on milrinone and sildenafil. Revatio increase while hospitalized.   6. CKD: Follow creatinine carefully with diuresis, mild elevation today. Now on po diuretics.   Procedures RHC on 04/23/2015  RA = 19 RV = 68/14/19 PA = 73/34 (53) PCW = 33 (v = 50) Fick cardiac output/index = 5.8/2.6 Thermo cardiac output/index = 5.8/2.6 PVR =4.0 WU FA sat = 98% PA sat = 66%, 70% RA/PCWP = 0.58  Consults: Palliative Care, Social Work, Programmer, systems as part of LVAD work up.   Discharge Weight Range: 262 pounds  Discharge Vitals: Blood pressure 100/53, pulse 73, temperature 98 F (36.7 C), temperature source Oral, resp. rate 18, height  (1.676 m), weight 262 lb 14.4 oz (119.251 kg), last menstrual  period 01/19/2014, SpO2 94 %.  Labs: Lab Results  Component Value Date   WBC 4.0 04/28/2015   HGB 13.4 04/28/2015   HCT 40.9 04/28/2015   MCV 93.4 04/28/2015   PLT 211 04/28/2015    Recent Labs Lab 04/28/15 0400  NA 132*  K 3.4*  CL 84*  CO2 34*  BUN 51*  CREATININE 2.07*  CALCIUM 8.8*  GLUCOSE 107*   Lab Results  Component Value Date   CHOL 238* 04/07/2015   HDL 65 04/07/2015   LDLCALC 141* 04/07/2015   TRIG 162*  04/07/2015   BNP (last 3 results)  Recent Labs  04/15/15 0840 04/15/15 1953 04/16/15 0355  BNP 172.0* 159.1* 262.1*    ProBNP (last 3 results)  Recent Labs  08/04/14 2339 08/21/14 1117  PROBNP 1727.0* 171.9*     Diagnostic Studies/Procedures   No results found.  Discharge Medications     Medication List    STOP taking these medications        aspirin 81 MG EC tablet      TAKE these medications        albuterol 108 (90 BASE) MCG/ACT inhaler  Commonly known as:  PROVENTIL HFA;VENTOLIN HFA  Inhale 2 puffs into the lungs every 6 (six) hours as needed for wheezing or shortness of breath.     allopurinol 300 MG tablet  Commonly known as:  ZYLOPRIM  Take 1 tablet (300 mg total) by mouth daily.     amiodarone 200 MG tablet  Commonly known as:  PACERONE  Take 1 tablet (200 mg total) by mouth 2 (two) times daily.     CORLANOR 5 MG Tabs tablet  Generic drug:  ivabradine  TAKE (1/2) TABLET BY MOUTH TWICE DAILY WITH A MEAL.     cyclobenzaprine 10 MG tablet  Commonly known as:  FLEXERIL  Take 10 mg by mouth 3 (three) times daily as needed for muscle spasms.     digoxin 0.125 MG tablet  Commonly known as:  LANOXIN  Take 0.5 tablets (0.0625 mg total) by mouth daily.     docusate sodium 100 MG capsule  Commonly known as:  COLACE  Take 1 capsule (100 mg total) by mouth 2 (two) times daily as needed for mild constipation.     DULoxetine 30 MG capsule  Commonly known as:  CYMBALTA  Take 30 mg by mouth daily.     ferrous sulfate 325 (65 FE) MG tablet  Take 325 mg by mouth 3 (three) times daily.     gabapentin 300 MG capsule  Commonly known as:  NEURONTIN  Take 600 mg by mouth 4 (four) times daily.     hydrALAZINE 25 MG tablet  Commonly known as:  APRESOLINE  Take 0.5 tablets (12.5 mg total) by mouth every 8 (eight) hours.     insulin aspart 100 UNIT/ML injection  Commonly known as:  novoLOG  Inject 0-20 Units into the skin 3 (three) times daily with  meals.     insulin detemir 100 UNIT/ML injection  Commonly known as:  LEVEMIR  Inject 0.58 mLs (58 Units total) into the skin 2 (two) times daily.     Linaclotide 290 MCG Caps capsule  Commonly known as:  LINZESS  Take 1 capsule (290 mcg total) by mouth daily. 30 minutes before breakfast     magnesium oxide 400 (241.3 MG) MG tablet  Commonly known as:  MAG-OX  Take 1 tablet (400 mg total) by mouth 2 (two) times daily.  metolazone 2.5 MG tablet  Commonly known as:  ZAROXOLYN  Take 30 minutes before taking morning dose of Torsemide on MONDAYS and THURSDAY.     milrinone 20 MG/100ML Soln infusion  Commonly known as:  PRIMACOR  Inject 47.5125 mcg/min into the vein continuous.     omeprazole 40 MG capsule  Commonly known as:  PRILOSEC  Take 1 capsule (40 mg total) by mouth daily.     oxyCODONE-acetaminophen 10-325 MG per tablet  Commonly known as:  PERCOCET  Take 0.5-1 tablets by mouth every 4 (four) hours as needed for pain.     potassium chloride SA 20 MEQ tablet  Commonly known as:  K-DUR,KLOR-CON  Take 60 meq am, 60 meq lunch, and 40 meq pm. On metolazone take and extra 20 meq in pm     ranolazine 500 MG 12 hr tablet  Commonly known as:  RANEXA  Take 1 tablet (500 mg total) by mouth 2 (two) times daily.     sildenafil 20 MG tablet  Commonly known as:  REVATIO  Take 2 tablets (40 mg total) by mouth 3 (three) times daily.     spironolactone 25 MG tablet  Commonly known as:  ALDACTONE  Take 1 tablet (25 mg total) by mouth 2 (two) times daily.     temazepam 30 MG capsule  Commonly known as:  RESTORIL  Take 30 mg by mouth at bedtime.     torsemide 100 MG tablet  Commonly known as:  DEMADEX  Take 1 tablet (100 mg total) by mouth 2 (two) times daily.     vitamin E 400 UNIT capsule  Take 400 Units by mouth daily.     warfarin 5 MG tablet  Commonly known as:  COUMADIN  Take 1 tablet (5 mg total) by mouth daily. Take as directed by LVAD clinic         Disposition   The patient will be discharged in stable condition to home.     Discharge Instructions    Contraindication to ACEI at discharge    Complete by:  As directed      Diet - low sodium heart healthy    Complete by:  As directed      Heart Failure patients record your daily weight using the same scale at the same time of day    Complete by:  As directed      Increase activity slowly    Complete by:  As directed           Follow-up Information    Follow up with [name], MD. Go on 06/01/2015.   Specialty:  Cardiology   Why:  at 1120 in  the Advanced Heart Failure Clinic--gate code [id number]--please bring all medications to appt   Contact information:   [address] [place] [address] [phone]         Duration of Discharge Encounter: Greater than 35 minutes   Signed, CLEGG,[name] NP-C  04/28/2015, 11:11 AM

## 2015-04-28 NOTE — Consult Note (Signed)
   [facility] CM Inpatient Consult   04/28/2015  [name] 27-Jun-1966 [id number] Follow up:  Previously active with [facility] Care Management Patient is on HF unit, sitting on side of bed.  She states that she has improved and feels better.  Patient states she would like to continue services with [facility] Care Management when discharged. Her plan is to go home with daughter and fiance at discharge.  This Clinical research associate will follow up with [facility] Care Management staff. Of note, [facility] Care Management does not interfere with or replace any services needed for this patient.  For questions, please contact: Charlesetta Shanks, RN BSN CCM [facility]  [phone] business mobile phone

## 2015-04-28 NOTE — Progress Notes (Signed)
Patient ID: [name], female   DOB: Sep 17, 1965, 49 y.o.   MRN: [id number] Advanced Heart Failure Rounding Note   Subjective:    Remains on milrinone at 0.375 mcg. Creatinine unchanged last 24 hours. Weight down 3 pounds.   Denies SOB/Orthopnea. Wants to go home   Underwent RHC on 8/12 RA = 19 RV = 68/14/19 PA = 73/34 (53) PCW = 33 (v = 50) Fick cardiac output/index = 5.8/2.6 Thermo cardiac output/index = 5.8/2.6 PVR =4.0 WU FA sat = 98% PA sat = 66%, 70% RA/PCWP = 0.58  Objective:   Weight Range:  Vital Signs:   Temp:  [97 F (36.1 C)-98 F (36.7 C)] 98 F (36.7 C) (08/17 0500) Pulse Rate:  [39-73] 73 (08/17 0500) Resp:  [18] 18 (08/17 0500) BP: (83-139)/(49-90) 100/53 mmHg (08/17 0500) SpO2:  [94 %-100 %] 94 % (08/17 0500) Weight:  [262 lb 14.4 oz (119.251 kg)-265 lb 9.6 oz (120.475 kg)] 262 lb 14.4 oz (119.251 kg) (08/17 0500) Last BM Date: 04/27/15  Weight change: Filed Weights   04/27/15 0500 04/27/15 1814 04/28/15 0500  Weight: 264 lb 8 oz (119.976 kg) 265 lb 9.6 oz (120.475 kg) 262 lb 14.4 oz (119.251 kg)    Intake/Output:   Intake/Output Summary (Last 24 hours) at 04/28/15 0734 Last data filed at 04/28/15 0200  Gross per 24 hour  Intake   1244 ml  Output   1000 ml  Net    244 ml     Physical Exam: General:  Obese female. No resp difficulty.In bed.  HEENT: normal Neck: Thick, JVP hard to see, estimate 6-7 cm. RIJ CVL.  Cor: PMI nondisplaced. Regular rate & rhythm. No rubs, gallops or murmurs. Lungs: clear Abdomen: soft, nontender, +distended. No hepatosplenomegaly. No bruits or masses. Good bowel sounds. Extremities: no cyanosis, clubbing, rash. No edema.  Neuro: alert & orientedx3, cranial nerves grossly intact. moves all 4 extremities w/o difficulty. Affect depressed  Telemetry: Sinus with BiV pacing 70s and frequent PVCs  Labs: Basic Metabolic Panel:  Recent Labs Lab 04/23/15 1200  04/25/15 0615 04/25/15 1548 04/26/15 0440  04/27/15 0533 04/28/15 0400  NA  --   < > 137 134* 132* 129* 132*  K  --   < > 3.2* 4.0 3.4* 3.8 3.4*  CL  --   < > 86* 83* 82* 82* 84*  CO2  --   < > 41* 40* 37* 37* 34*  GLUCOSE  --   < > 87 106* 181* 179* 107*  BUN  --   < > 32* 35* 36* 44* 51*  CREATININE 1.67*  < > 1.47* 1.60* 1.75* 2.02* 2.07*  CALCIUM  --   < > 9.0 9.1 8.8* 8.7* 8.8*  MG 2.4  --   --   --  2.0  --   --   < > = values in this interval not displayed.  Liver Function Tests: No results for input(s): AST, ALT, ALKPHOS, BILITOT, PROT, ALBUMIN in the last 168 hours. No results for input(s): LIPASE, AMYLASE in the last 168 hours. No results for input(s): AMMONIA in the last 168 hours.  CBC:  Recent Labs Lab 04/23/15 1200 04/27/15 0533 04/28/15 0400  WBC 4.3 4.3 4.0  HGB 12.9 13.1 13.4  HCT 40.1 40.2 40.9  MCV 95.7 94.6 93.4  PLT 210 196 211    Cardiac Enzymes: No results for input(s): CKTOTAL, CKMB, CKMBINDEX, TROPONINI in the last 168 hours.  BNP: BNP (last 3 results)  Recent  Labs  04/15/15 0840 04/15/15 1953 04/16/15 0355  BNP 172.0* 159.1* 262.1*    ProBNP (last 3 results)  Recent Labs  08/04/14 2339 08/21/14 1117  PROBNP 1727.0* 171.9*      Other results:  Imaging: No results found.   Medications:     Scheduled Medications: . allopurinol  300 mg Oral Daily  . amiodarone  200 mg Oral BID  . digoxin  0.0625 mg Oral Daily  . DULoxetine  30 mg Oral Daily  . enoxaparin (LOVENOX) injection  60 mg Subcutaneous Q24H  . ferrous sulfate  325 mg Oral TID WC  . gabapentin  600 mg Oral TID  . hydrALAZINE  12.5 mg Oral 3 times per day  . insulin aspart  0-20 Units Subcutaneous TID WC  . insulin detemir  58 Units Subcutaneous BID  . ivabradine  2.5 mg Oral BID WC  . Linaclotide  290 mcg Oral Daily  . magnesium oxide  400 mg Oral BID  . pantoprazole  40 mg Oral Daily  . potassium chloride SA  60 mEq Oral BID  . ranolazine  500 mg Oral BID  . sildenafil  40 mg Oral TID  . sodium  chloride  3 mL Intravenous Q12H  . sodium chloride  3 mL Intravenous Q12H  . sodium chloride  3 mL Intravenous Q12H  . spironolactone  25 mg Oral BID  . temazepam  30 mg Oral QHS  . torsemide  100 mg Oral BID  . Warfarin - Pharmacist Dosing Inpatient   Does not apply q1800    Infusions: . milrinone 0.375 mcg/kg/min (04/28/15 0421)    PRN Medications: sodium chloride, sodium chloride, sodium chloride, acetaminophen, albuterol, ALPRAZolam, bisacodyl, cyclobenzaprine, docusate sodium, guaiFENesin-dextromethorphan, ondansetron (ZOFRAN) IV, oxyCODONE-acetaminophen **AND** oxyCODONE, sodium chloride, sodium chloride, sodium chloride   Assessment/Plan    1. Acute/Chronic Systolic Heart Failure: Nonischemic cardiomyopathy, s/p [employer] CRT-D. EF 30-35% (03/2015). NYHA class IV symptoms on Milrinone gtt at 0.375.  Hemodynamics much better overall.  That said, with her obesity, noncompliance (was eating a Big Mac in the CCU a few days ago), and h/o GI AVMs, she is a difficult candidate for LVAD.  RV not thought to be a limiting factor.    Creatinine unchanged in the last 24 hours but BUN trending up. Volume appear stable.     - Continue current milrinone, spironolactone, digoxin.  - Continue hydralazine 12.5 mg tid.  - Concern for long term milrinone with history of VT/VF.  We talked about the need for working on weight loss/diet.  Another major concern is her ability to tolerate coumadin if an LVAD were to be placed.  Discussed at [facility] with Dr Donata Clay => favor trial of coumadin with INR 2-2.5 to make sure that she does not develop GI bleeding.  Will stop ASA 81 for now.  2. OSA: Using CPAP.  3. 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  Still with frequent PVCs.  Replete K today.  4. H/O GI bleed with AVM.  No evidence of acute bleeding.  5. Pulmonary HTN: RHC 03/31/15 PVR 10. Improved on milrinone and sildenafil, PVR 4 on 8/12 RHC.   Continue Revatio 40 mg tid.  6. CKD: Follow creatinine carefully with diuresis, mild elevation today. Now on po diuretics.  7. Deconditioning: Needs to get out of bed and ambulate today, will have cardiac rehab see her.     [facility] to follow for home milrinone and weekly  BMET/INR. Ask [facility] to use diuretic protocol.   Length of Stay: 12  CLEGG,[name]  NP-C  04/28/2015, 7:34 AM  Advanced Heart Failure Team Pager [phone] (M-F; 7a - 4p)  Please contact [facility] for night-coverage after hours (4p -7a ) and weekends on [email or web address]  Patient seen with NP, agree with the above note.  Stable creatinine today.  Volume looks ok.  Generally less short of breath walking.  I think that she can go home today. She has started coumadin with goal INR 2-2.5 to see if she can tolerate it prior to possible LVAD.  We also discussed efforts at weight loss.   She will need home oxygen with exertion.  She will need INR followup (drawn by [facility], followed by LVAD nurse).  She needs followup at CHF clinic in 1 week.  Home meds: coumadin (INR 2-2.5), torsemide 100 bid, metolazone 2.5 twice weekly (monday/thursday), KCl 60/60/40 on non-metolazone days, KCl 60 tid on metolazone days, hydralazine 12.5 tid, Revatio 40 tid, milrinone 0.375, ivabradine 2.5 bid, spironolactone 25 bid, digoxin 0.0625 daily, Ranexa 500 bid.   [name] 04/28/2015 9:11 AM

## 2015-04-28 NOTE — Progress Notes (Signed)
Heart Failure Navigator Consult Note  Presentation: [name] is a 49 yo woman with morbidity obesity, T2DM, hypertension, NICM with CRT-D (BS), OSA on CPAP with multiple recent admissions most recently 7/15- 7/28 with VT/VF with multiple ICD shocks and cardiogenic shock requiring milrinone with discharge home on milrinone at dry weight 263 lbs. She was seen in clinic today and weight up to 276 lbs and she has increased dyspnea, nausea and weight gain. She was prescribed a metalazone but it didn't really start her urine output until after getting to the hospital. She also noted some palpitations.  Past Medical History  Diagnosis Date  . CHF (congestive heart failure)     a. EF 30-35%, RV mildly dilated (difficult study 11/2013) b. RHC (12/2013): RA 38/37 (35), RV 90/28, PA 95/51 (71), PCWP 54, PA 40%, AO 96 %, CO/CI Fick 3.2/1.4, PVR 5.3   . Nonischemic cardiomyopathy 12/2013    a. LHC (12/2013) normal coronary anatomy   . Mitral regurgitation     moderate to severe  . LBBB (left bundle branch block)   . HTN (hypertension)   . Asthma   . IBS (irritable bowel syndrome)     with primarily constipation  . Morbid obesity   . GERD (gastroesophageal reflux disease)   . IDA (iron deficiency anemia)     2o TO SB AVMS, Hx parenteral iron Dr Mariel Sleet  . AVM (arteriovenous malformation)     Small bowel, s/p duble balloon enteroscopy/APC jejunum Dr Gwinda Passe [facility]) 01/23/2011  . Peripheral neuropathy   . OSA (obstructive sleep apnea)     on CPAP qhs  . Diabetes mellitus without complication   . Torsades de pointes     a. appropriate ICD therapy 12/2014 in setting of hypokalemia  . Pneumonia     Social History   Social History  . Marital Status: Single    Spouse Name: [name]  . Number of Children: 1  . Years of Education: [name]   Occupational History  . disabled    Social History Main Topics  . Smoking status: Former Smoker -- 0.00 packs/day for 0 years    Types: Cigarettes  . Smokeless  tobacco: Former Neurosurgeon     Comment: Quit x 4 years  . Alcohol Use: No  . Drug Use: No  . Sexual Activity: Not Asked   Other Topics Concern  . None   Social History Narrative   Disability for heart disease. Was a CNA.   Single- 1 daughter age 16.    Does not get regular exercise.      ECHO:Study Conclusions--04/07/15  - Procedure narrative: Transthoracic echocardiography. Image quality was poor. The study was technically difficult, as a result of poor acoustic windows and body habitus. Limted echo. Contrast was not administered. - Left ventricle: The cavity size was severely dilated. Wall thickness was increased in a pattern of moderate LVH. Systolic function was moderately to severely reduced. The estimated ejection fraction was in the range of 30% to 35%. Diffuse hypokinesis. - Mitral valve: There was mild regurgitation. - Left atrium: Severely dilated at 36 cm2. - Right ventricle: Pacer wire or catheter noted in right ventricle. - Right atrium: dilated. Pacer wire or catheter noted in right atrium. - Tricuspid valve: There was moderate regurgitation.  Impressions:  - Limited echo for LV function - poor echo windows, contrast not administered. LVEF is estimated to be around 30% with a severely dilated LV. Right heart defibrillator wires are noted with moderate TR.  Transthoracic echocardiography.  M-mode, limited 2D, limited spectral Doppler, and color Doppler. Birthdate: Patient birthdate: [birth year]. Age: Patient is 49 yr old. Sex: Gender: female.  BMI: 45.8 kg/m^2. Blood pressure:   114/85 Patient status: Inpatient. Study date: Study date: 04/07/2015. Study time: 11:29 AM. Location: ICU/CCU  BNP    Component Value Date/Time   BNP 262.1* 04/16/2015 0355    ProBNP    Component Value Date/Time   PROBNP 171.9* 08/21/2014 1117     Education Assessment and Provision:  Detailed education and instructions provided on heart  failure disease management including the following:  Signs and symptoms of Heart Failure When to call the physician Importance of daily weights Low sodium diet Fluid restriction Medication management Anticipated future follow-up appointments  Patient education given on each of the above topics.  Patient acknowledges understanding and acceptance of all instructions.  I spoke at length with [name] regarding her HF and HF recommendations.  She is able to tell me that she is being evaluated for a "heart pump".  She tells me that she has a scale and has been weighing daily--"that's one of the reasons I came back in".  We discussed at length a low sodium diet and high sodium foods to avoid.  She asked about specific foods including pickled banana peppers, salsa, pinto beans etc.  I reviewed with her healthy ways to enjoy some foods that she likes-- by making them fresh (such as salsa) versus buying store brands which contain more sodium.  She is able to teach back the importance of reading labels and that all foods have some sodium content.  She denies any issues with getting or taking her prescribed medications.  She lives with her daughter and fiance in [place].  Education Materials:  "Living Better With Heart Failure" Booklet and Daily Weight Tracker Tool    High Risk Criteria for Readmission and/or Poor Patient Outcomes:   EF <30%- 30-35%  2 or more admissions in 6 months-  Yes  Difficult social situation- No  Demonstrates medication noncompliance- Denies   Barriers of Care:  Knowledge and compliance--? social support  Discharge Planning:   Plans to return to home with daughter and family

## 2015-04-28 NOTE — Consult Note (Signed)
Patient HT:[id number] PAISLI SILFIES      DOB: [birth year]      OTL:[id number]     Consult Note from the Palliative Medicine Team at [facility] Requested by: Dr Sung Amabile     PCP: Robert Bellow, MD Reason for Consultation:  LVAD evaluation    Phone [phone]  Assessment of patients Current state:   This NP [name] reviewed medical records, received report from team, assessed the patient and then meet at the bedside with the patient, no family present  to discuss a preparedness plan in light of possible  LVAD implantation in the future.  Impressed the importance of having this discussion again when her support persons can be present   A detailed discussion was had today regarding the concept of a preparedness plan as it relates to LVAD theapy.   Patient was comfortable talking about the "what ifs" and understood  the importance of today's conversation, gathering all  the information and offered  education in order to be a full participant in her own healthcare.  Concepts specific to future possibilities of -long term ventilation -artificial feeding and hydration -long term antibiotic use -dialysis -psychological adjustments -need to terminate the pump- Other chronic or terminal disease unrelated to cardiac LVAD  understands the risks and benefits of the procedure as it relates to his future.   Patient was able to verbalize  the importance of quality of life, and is hopeful that the  LVAD procedure will increase her quality and quantity of life.    At this time patient is open to all available medical interventions to prolong life and the success of the LVAD therapy.   Patient is encouraged to continue conversation into the future  With her family and support persons as it is vital for patient centered care.    Brief HPI:  [name] is a 49 year old female with a history of NICM s/p BiV ICD 2007 ([place]), chronic systolic HF, DM2, HTN, recurrent  VT, morbid obesity and OSA on CPAP.   Admitted to [facility] 12/08[facility]13 with progressive DOE.and volume overload. She was transferred from [facility] to [facility] due to acute decompensated HF. ECHO EF 35% by echo but difficult study.  Admitted 08/05/14 after ICD shock for V fib. Also diuresed with IV lasix and transitioned back to torsemide. Also with thyromegaly and had ultrasound with small cysts noted. Discharge weight was 249 pounds. She had another ICD shock in 12/15 for VT.   She is unable to tolerate Corlanor 5 mg bid but does ok on 2.5 mg bid.  Admitted 7/15 through 7/28 after VF/VT with multiple shocks and cardiogenic shock requiring milrinone. Had [place] with high pulmonary pressures and she was started on sildenafil. Discharged on milrinone 0.375 mcg. Met with LVAD coordinator and cardiac surgery for possible advanced therapies. Discharge weight 263 pounds.   Admitted 8/4 through 8/17 with volume overload. Diuresed with IV lasix and transitioned to torsemide 100 mg twice a day. She continued on milrinone 0.375 mcg. Evaluated by LVAD team. Discharge weight 262 pounds.    ROS:  Weakness, fatigue on minimal exertion   PMH:  Past Medical History  Diagnosis Date  . CHF (congestive heart failure)     a. EF 30-35%, RV mildly dilated (difficult study 11/2013) b. RHC (12/2013): RA 38/37 (35), RV 90/28, PA 95/51 (71), PCWP 54, PA 40%, AO 96 %, CO/CI Fick 3.2/1.4, PVR 5.3   . Nonischemic cardiomyopathy 12/2013    a. LHC (  12/2013) normal coronary anatomy   . Mitral regurgitation     moderate to severe  . LBBB (left bundle branch block)   . HTN (hypertension)   . Asthma   . IBS (irritable bowel syndrome)     with primarily constipation  . Morbid obesity   . GERD (gastroesophageal reflux disease)   . IDA (iron deficiency anemia)     2o TO SB AVMS, Hx parenteral iron Dr Tressie Stalker  . AVM (arteriovenous malformation)     Small bowel, s/p duble balloon enteroscopy/APC jejunum Dr Arsenio Loader [facility]) 01/23/2011  .  Peripheral neuropathy   . OSA (obstructive sleep apnea)     on CPAP qhs  . Diabetes mellitus without complication   . Torsades de pointes     a. appropriate ICD therapy 12/2014 in setting of hypokalemia  . Pneumonia      PSH: Past Surgical History  Procedure Laterality Date  . Appendectomy    . Cholecystectomy      biliary dyskinesia  . Mastectomy  2003    left, partial  . Knee arthroscopy  2005    left  . Small bowel enteroscopy  MAY 2012 [facility] DR. GILLIAM    SB AVMS s/p APC  . Colonoscopy  OCT 2010/SEP 2011    tortuous colon, 3 polyps-benign(2010),   . Upper gastrointestinal endoscopy  '08, '10, SEP 2011    mild antral gastritis (2010), negative SB bx (2010), incomlpete Schatzki's ring (2011)  . Small bowel enteroscopy  SEP 2011 [facility]    Fields-NO AVMS  . Givens capsule study  NOV 2010     NO AVMS, normal  . Irrigation and debridement abscess  06/25/2012    Procedure: MINOR INCISION AND DRAINAGE OF ABSCESS;  Surgeon: Scherry Ran, MD;  Location: [facility] ORS;  Service: General;  Laterality: [name];  Incision & Drainage of Infected Sebaceous Cyst on Chest  . Breast surgery    . Implantable cardioverter defibrillator generator change Left 02/15/2012    BSX CRTD   . Left and right heart catheterization with coronary/graft angiogram  12/12/2013    Procedure: LEFT AND RIGHT HEART CATHETERIZATION WITH Beatrix Fetters;  Surgeon: Peter M Martinique, MD;  Location: [facility] CATH LAB;  Service: Cardiovascular;;  . Right heart catheterization [name] 01/06/2015    Procedure: RIGHT HEART CATH;  Surgeon: Larey Dresser, MD;  Location: [facility] CATH LAB;  Service: Cardiovascular;  Laterality: [name];  . Esophagogastroduodenoscopy  Sept 2013    [facility]: diminutive duodenal polyp, polyp in gastric cardia, path with benign duodenal mucosa with focally prominent Brunner's glands. Fundic gland polyps.   . Cardiac catheterization [name] 03/31/2015    Procedure: Right Heart Cath;  Surgeon: Jolaine Artist, MD;   Location: [place] CV LAB;  Service: Cardiovascular;  Laterality: [name];  . Cardiac catheterization [name] 04/23/2015    Procedure: Right Heart Cath;  Surgeon: Jolaine Artist, MD;  Location: [place] CV LAB;  Service: Cardiovascular;  Laterality: [name];   I have reviewed the FH and SH and  If appropriate update it with new information. Allergies  Allergen Reactions  . Cymbalta [Duloxetine Hcl] Other (See Comments)    Only in a mild dose pt can tolerate  . Trazodone And Nefazodone Other (See Comments)    Nightmares   . Lyrica [Pregabalin] Swelling   Scheduled Meds: . allopurinol  300 mg Oral Daily  . amiodarone  200 mg Oral BID  . digoxin  0.0625 mg Oral Daily  . DULoxetine  30  mg Oral Daily  . enoxaparin (LOVENOX) injection  60 mg Subcutaneous Q24H  . ferrous sulfate  325 mg Oral TID WC  . gabapentin  600 mg Oral TID  . hydrALAZINE  12.5 mg Oral 3 times per day  . insulin aspart  0-20 Units Subcutaneous TID WC  . insulin detemir  58 Units Subcutaneous BID  . ivabradine  2.5 mg Oral BID WC  . Linaclotide  290 mcg Oral Daily  . magnesium oxide  400 mg Oral BID  . pantoprazole  40 mg Oral Daily  . potassium chloride  40 mEq Oral Once  . potassium chloride SA  60 mEq Oral BID  . ranolazine  500 mg Oral BID  . sildenafil  40 mg Oral TID  . sodium chloride  3 mL Intravenous Q12H  . sodium chloride  3 mL Intravenous Q12H  . sodium chloride  3 mL Intravenous Q12H  . spironolactone  25 mg Oral BID  . temazepam  30 mg Oral QHS  . torsemide  100 mg Oral BID  . Warfarin - Pharmacist Dosing Inpatient   Does not apply q1800   Continuous Infusions: . milrinone 0.375 mcg/kg/min (04/28/15 0421)   PRN Meds:.sodium chloride, sodium chloride, sodium chloride, acetaminophen, albuterol, ALPRAZolam, bisacodyl, cyclobenzaprine, docusate sodium, guaiFENesin-dextromethorphan, ondansetron (ZOFRAN) IV, oxyCODONE-acetaminophen **AND** oxyCODONE, sodium chloride, sodium chloride, sodium  chloride    BP 100/53 mmHg  Pulse 73  Temp(Src) 98 F (36.7 C) (Oral)  Resp 18  Ht 5' 6"  (1.676 m)  Wt 119.251 kg (262 lb 14.4 oz)  BMI 42.45 kg/m2  SpO2 94%  LMP 01/19/2014      Intake/Output Summary (Last 24 hours) at 04/28/15 1014 Last data filed at 04/28/15 0600  Gross per 24 hour  Intake   1002 ml  Output    600 ml  Net    402 ml     Physical Exam:  General: obese female, NAD HEENT:  Moist buccal membranes, no exudate noted Chest:   CTA CVS: RRR  Labs: CBC    Component Value Date/Time   WBC 4.0 04/28/2015 0400   RBC 4.38 04/28/2015 0400   RBC 4.48 04/26/2011 1213   HGB 13.4 04/28/2015 0400   HCT 40.9 04/28/2015 0400   PLT 211 04/28/2015 0400   MCV 93.4 04/28/2015 0400   MCH 30.6 04/28/2015 0400   MCHC 32.8 04/28/2015 0400   RDW 14.9 04/28/2015 0400   LYMPHSABS 1.5 04/15/2015 0840   MONOABS 0.4 04/15/2015 0840   EOSABS 0.2 04/15/2015 0840   BASOSABS 0.0 04/15/2015 0840    BMET    Component Value Date/Time   NA 132* 04/28/2015 0400   K 3.4* 04/28/2015 0400   CL 84* 04/28/2015 0400   CO2 34* 04/28/2015 0400   GLUCOSE 107* 04/28/2015 0400   BUN 51* 04/28/2015 0400   CREATININE 2.07* 04/28/2015 0400   CREATININE 0.83 02/06/2012 1520   CALCIUM 8.8* 04/28/2015 0400   GFRNONAA 27* 04/28/2015 0400   GFRAA 31* 04/28/2015 0400    CMP     Component Value Date/Time   NA 132* 04/28/2015 0400   K 3.4* 04/28/2015 0400   CL 84* 04/28/2015 0400   CO2 34* 04/28/2015 0400   GLUCOSE 107* 04/28/2015 0400   BUN 51* 04/28/2015 0400   CREATININE 2.07* 04/28/2015 0400   CREATININE 0.83 02/06/2012 1520   CALCIUM 8.8* 04/28/2015 0400   PROT 8.0 04/16/2015 0355   ALBUMIN 3.7 04/16/2015 0355   AST 37 04/16/2015 0355  ALT 35 04/16/2015 0355   ALKPHOS 77 04/16/2015 0355   BILITOT 0.8 04/16/2015 0355   GFRNONAA 27* 04/28/2015 0400   GFRAA 31* 04/28/2015 0400      Time In Time Out Total Time Spent with Patient Total Overall Time  0800 0900 60 min 60  min    Greater than 50%  of this time was spent counseling and coordinating care related to the above assessment and plan.   Barbara Lessen NP  Palliative Medicine Team Team Phone # [phone] Pager [phone]

## 2015-04-28 NOTE — Progress Notes (Signed)
CARDIAC REHAB PHASE I   PRE:  Rate/Rhythm: 70 paced  BP:  Sitting: 109/69        SaO2: 87 RA, 98 on 2L  MODE:  Ambulation: 500 ft   POST:  Rate/Rhythm: 80 paced  BP:  Sitting: 124/79         SaO2: 99 2L  Pt sats 87 on RA at rest, 2L O2 applied, sats 98% on 2L. Pt ambulated 500 ft on RA, IV, rolling walker, gait belt, assist x1, steady gait, moderate pace, tolerated well. Pt denies CP, dizziness, DOE, declined rest stop. Completed CHF education. Pt sates she spoke with HF RN but would like to review again. Reviewed activity progression, daily weights, HF book and zone tool, heart healthy diet, carb counting, sodium and fluid restrictions, and exercise guidelines. Pt verbalized understanding. Pt is not a candidate for phase 2 cardiac rehab at this time due to home milrinone. Pt does have questions regarding coumadin and diet, pt states she has not spoke with a pharmacist about this medication. RN notified. Pt to edge of bed after walk, call bell within reach.   [phone]  Joylene Grapes, RN, BSN 04/28/2015 11:10 AM

## 2015-04-28 NOTE — Patient Outreach (Signed)
[facility]) Care Management  04/28/2015  [name] [birth year] [id number]   Notified by [name] BSN, [facility] Liaison of [name]'s anticipated discharge. I have reviewed discharge notes made thus far by hospital and heart failure teams. [name] wishes to continue [facility] Care Management services. I confirmed with [name] office that [name] has a follow up appointment with him on 04/30/15. She also has an appointment scheduled with [name] on 06/01/15.   I will contact [name] at home by phone tomorrow for transition of care assessment and will plan to see her face to face next week at her home.    [name] MHA,BSN,RN,CCM [facility] Care Management  [phone]

## 2015-04-28 NOTE — Discharge Instructions (Signed)

## 2015-04-29 ENCOUNTER — Encounter (HOSPITAL_COMMUNITY): Payer: Medicare Other

## 2015-04-29 NOTE — Progress Notes (Signed)
CSW met at bedside with patient to follow up on LVAD assessment. CSW discussed caregiver role and support from family. Patient identified limited support systems and shared at length concerns about her SO Tommy. Patient stated that he is not reliable and she has trust issues with him. Patient has 49yo daughter who she is concerned about future care should "something happen". Patient was very tearful and spoke about her life and concerns. CSW shared concerns about Konrad Dolores as primary caregiver if he "is not reliable". Caregiver role discussed at length and CSW stated that role will be indefinite/life of the LVAD implant. Patient verbalized understanding and will give further thought to caregiver role and concerns shared by CSW. Patient appears to have trust issues with SO and lacks confidence in his role as a caregiver.CSW will follow up with patient in outpatient HF clinic. Raquel Sarna, [place]

## 2015-04-30 ENCOUNTER — Other Ambulatory Visit: Payer: Self-pay | Admitting: *Deleted

## 2015-04-30 ENCOUNTER — Ambulatory Visit: Payer: Self-pay | Admitting: Infectious Diseases

## 2015-04-30 DIAGNOSIS — I4891 Unspecified atrial fibrillation: Secondary | ICD-10-CM | POA: Insufficient documentation

## 2015-04-30 LAB — POCT INR: INR: 1.2

## 2015-04-30 NOTE — Progress Notes (Signed)
Late entry:  D/c'd home on 04/28/15 at 1511.  All d/c  Instructions explained and given to pt and family at the bed side.  Verbalized understanding.  Amanda Pea, Char[employer] fundraiser.

## 2015-04-30 NOTE — Patient Outreach (Signed)
[facility]) Care Management  04/30/2015  [name] May 11, 1966 [id number]  Called to follow up with [name] today post discharge from [facility] after a stay for CHF exacerbation/respiratory failure. I have been unable to reach [name] by phone since her discharge on Wednesday.  I was unable to reach her by phone today and could not leave a voice message (message stated voice mailbox was full).  [name] was scheduled for an office visit with her primary care provider, Dr. John Giovanni today. I spoke with Maralyn Sago at Dr. Michelle Nasuti office who said that [name] was called on Wednesday for an appointment reminder but they were unable to reach her or leave a voice message. Maralyn Sago states that today [name] called about 15 minutes before her appointment to say that she didn't know she was supposed to be there today but wanted to cancel her appointment and reschedule. She now has an appointment with Dr. [name] on 05/07/15 at 10am. Fortunately, [name] also is to see Tonye Becket NP in the Heart Failure Clinic on Tuesday.   I will continue to try to reach [name] today. I will be out of the office on Monday and Tuesday but will ask my colleague to reach out to [name] on Monday if I do not reach her today.    Marja Kays MHA,BSN,RN,CCM [facility] Care Management  [phone]

## 2015-05-03 ENCOUNTER — Ambulatory Visit: Payer: Medicare Other | Admitting: Infectious Diseases

## 2015-05-03 LAB — POCT INR: INR: 1.3

## 2015-05-04 ENCOUNTER — Encounter: Payer: Self-pay | Admitting: Internal Medicine

## 2015-05-04 ENCOUNTER — Encounter (HOSPITAL_COMMUNITY): Payer: Self-pay

## 2015-05-04 ENCOUNTER — Ambulatory Visit (HOSPITAL_COMMUNITY)
Admit: 2015-05-04 | Discharge: 2015-05-04 | Disposition: A | Discharge status: Home / Self Care | Payer: Medicare Other | Source: Ambulatory Visit | Attending: Cardiology | Admitting: Cardiology

## 2015-05-04 ENCOUNTER — Ambulatory Visit (INDEPENDENT_AMBULATORY_CARE_PROVIDER_SITE_OTHER): Payer: Medicare Other | Admitting: Internal Medicine

## 2015-05-04 VITALS — BP 108/68 | HR 74 | Wt 262.2 lb

## 2015-05-04 VITALS — BP 122/74 | HR 81 | Ht 65.0 in | Wt 263.0 lb

## 2015-05-04 DIAGNOSIS — I272 Other secondary pulmonary hypertension: Secondary | ICD-10-CM | POA: Insufficient documentation

## 2015-05-04 DIAGNOSIS — I5023 Acute on chronic systolic (congestive) heart failure: Secondary | ICD-10-CM

## 2015-05-04 DIAGNOSIS — I5022 Chronic systolic (congestive) heart failure: Secondary | ICD-10-CM | POA: Insufficient documentation

## 2015-05-04 DIAGNOSIS — K219 Gastro-esophageal reflux disease without esophagitis: Secondary | ICD-10-CM | POA: Diagnosis not present

## 2015-05-04 DIAGNOSIS — I428 Other cardiomyopathies: Secondary | ICD-10-CM

## 2015-05-04 DIAGNOSIS — Z95810 Presence of automatic (implantable) cardiac defibrillator: Secondary | ICD-10-CM

## 2015-05-04 DIAGNOSIS — Z9989 Dependence on other enabling machines and devices: Secondary | ICD-10-CM

## 2015-05-04 DIAGNOSIS — I270 Primary pulmonary hypertension: Secondary | ICD-10-CM | POA: Diagnosis not present

## 2015-05-04 DIAGNOSIS — Z7901 Long term (current) use of anticoagulants: Secondary | ICD-10-CM | POA: Insufficient documentation

## 2015-05-04 DIAGNOSIS — I472 Ventricular tachycardia, unspecified: Secondary | ICD-10-CM

## 2015-05-04 DIAGNOSIS — I429 Cardiomyopathy, unspecified: Secondary | ICD-10-CM | POA: Diagnosis not present

## 2015-05-04 DIAGNOSIS — G4733 Obstructive sleep apnea (adult) (pediatric): Secondary | ICD-10-CM | POA: Diagnosis not present

## 2015-05-04 DIAGNOSIS — I4729 Other ventricular tachycardia: Secondary | ICD-10-CM

## 2015-05-04 DIAGNOSIS — J45909 Unspecified asthma, uncomplicated: Secondary | ICD-10-CM | POA: Diagnosis not present

## 2015-05-04 DIAGNOSIS — I10 Essential (primary) hypertension: Secondary | ICD-10-CM | POA: Diagnosis not present

## 2015-05-04 DIAGNOSIS — Z79899 Other long term (current) drug therapy: Secondary | ICD-10-CM | POA: Insufficient documentation

## 2015-05-04 DIAGNOSIS — Z794 Long term (current) use of insulin: Secondary | ICD-10-CM | POA: Diagnosis not present

## 2015-05-04 DIAGNOSIS — E1142 Type 2 diabetes mellitus with diabetic polyneuropathy: Secondary | ICD-10-CM | POA: Diagnosis not present

## 2015-05-04 DIAGNOSIS — I4721 Torsades de pointes: Secondary | ICD-10-CM

## 2015-05-04 LAB — CUP PACEART INCLINIC DEVICE CHECK
Brady Statistic RA Percent Paced: 28 %
Brady Statistic RV Percent Paced: 87 %
Date Time Interrogation Session: 20160823040000
HighPow Impedance: 49 Ohm
HighPow Impedance: 56 Ohm
Lead Channel Impedance Value: 496 Ohm
Lead Channel Pacing Threshold Amplitude: 1 V
Lead Channel Pacing Threshold Amplitude: 1.5 V
Lead Channel Pacing Threshold Pulse Width: 0.8 ms
Lead Channel Sensing Intrinsic Amplitude: 2.3 mV
Lead Channel Setting Pacing Amplitude: 2 V
Lead Channel Setting Pacing Amplitude: 2.5 V
Lead Channel Setting Pacing Pulse Width: 0.3 ms
Lead Channel Setting Sensing Sensitivity: 1 mV
MDC IDC MSMT LEADCHNL LV IMPEDANCE VALUE: 1168 Ohm
MDC IDC MSMT LEADCHNL LV SENSING INTR AMPL: 3.2 mV
MDC IDC MSMT LEADCHNL RA IMPEDANCE VALUE: 479 Ohm
MDC IDC MSMT LEADCHNL RA PACING THRESHOLD PULSEWIDTH: 0.5 ms
MDC IDC MSMT LEADCHNL RV PACING THRESHOLD AMPLITUDE: 0.7 V
MDC IDC MSMT LEADCHNL RV PACING THRESHOLD PULSEWIDTH: 0.3 ms
MDC IDC MSMT LEADCHNL RV SENSING INTR AMPL: 17.8 mV
MDC IDC SET LEADCHNL LV PACING PULSEWIDTH: 0.8 ms
MDC IDC SET LEADCHNL RV PACING AMPLITUDE: 2.2 V
MDC IDC SET LEADCHNL RV SENSING SENSITIVITY: 0.5 mV
MDC IDC SET ZONE DETECTION INTERVAL: 300 ms
Pulse Gen Serial Number: 108094
Zone Setting Detection Interval: 250 ms
Zone Setting Detection Interval: 343 ms

## 2015-05-04 NOTE — Patient Instructions (Addendum)
Continue current regimen.  Follow up appointment in 2 weeks.

## 2015-05-04 NOTE — Progress Notes (Signed)
Patient ID: [name], female   DOB: 06-18-1966, 49 y.o.   MRN: [id number] EP: Dr Lovena Le PCP: Dr Karie Kirks PRIMARY HF MD: Dr Aundra Dubin   HPI: [name] is a 49 year old female with a history of NICM s/p BiV ICD 2007 ([place]), chronic systolic HF, DM2, HTN, recurrent VT, morbid obesity and OSA on CPAP.   Admitted to [facility] 12/08/13 with progressive DOE.and volume overload.  She was transferred from [facility] to [facility] due to acute decompensated HF. ECHO EF 35% by echo but difficult study.  Admitted 08/05/14 after ICD shock for V fib. Also diuresed with IV lasix and transitioned back to torsemide. Also with thyromegaly and had ultrasound with small cysts noted. Discharge weight was 249 pounds.  She had another ICD shock in 12/15 for VT.   She is unable to tolerate Corlanor 5 mg bid but does ok on 2.5 mg bid.  Admitted 7/15 through 7/28 after VF/VT with multiple shocks and cardiogenic shock requiring milrinone. Had [place] with high pulmonary pressures and she was started on sildenafil. Discharged on milrinone 0.375 mcg. Met with LVAD coordinator and cardiac surgery for possible advanced therapies. Discharge weight 263 pounds.   Admitted 8/4 through 8/17 with volume overload. Diuresed with IV lasix and transitioned to torsemide 100 mg twice a day. She continued on milrinone 0.375 mcg. Evaluated by LVAD team. Discharge weight  262 pounds.   Today she returns for post hospital follow up. Saw Dr Lovena Le earlier today and will consider lowering amio in 3 months. Overall feeling pretty good. Mild dyspnea with exertion. Denies PND/Orthopnea. Weight at home 261 pounds. Taking all medications. Trying to follow low salt diet. No bleeding problems.  Followed by [facility].   [place] 04/23/2015  RA = 19 RV = 68/14/19 PA = 73/34 (53) PCW = 33 (v = 50) Fick cardiac output/index = 5.8/2.6 Thermo cardiac output/index = 5.8/2.6 PVR =4.0 WU FA sat = 98% PA sat = 66%, 70% RA/PCWP = 0.58   RHC 03/31/15 RA = 28 RV =  83/19/30 PA = 86/38 (65) PCW = 36 Fick cardiac output/index = 2.9/1.3 Thermo cardiac output/index = 3.1/1.3 PVR = 10 Arterial sat = 99% PA sat = 45%, 48%  RHC 01/06/15 RA mean 18 RV 66/22 PA 66/34, mean 48 PCWP mean 28 Oxygen saturations: PA 55% AO 93% Cardiac Output (Fick) 3.77  Cardiac Index (Fick) 1.8 PVR 5.3 WU  Cardiac Output (Thermo) 4.77 Cardiac Index (Thermo) 2.26 PVR 4.2 WU  [facility] 12/12/13  RA 38/37 (35)  RV 90/28  PA 95/51 (71)  PCWP 57/73 (54)  LV 127/50  AO 123/66 (100)  Oxygen saturations:  PA 40%  AO 96%  Cardiac Output/Index (Fick) 3.2/1.4, PVR 5.3  Normal coronary anatomy  CPX 6/15 FVC 1.34 (42%)  FEV1 1.11 (43%)  FEV1/FVC 83%  MVV 42 (40%) RPE: 19 Resting HR: 80 Peak HR: 115 (67% age predicted max HR) BP rest: 102/70 BP peak: 118/68 Peak VO2: 9.3 (52.9% predicted peak VO2) VE/VCO2 slope: 29.3 OUES: 1.57 Peak RER: 0.95 Ventilatory Threshold: 7.7 (43.8% predicted peak VO2) Peak RR 44 Peak Ventilation: 32 VE/MVV: 76.2% PETCO2 at peak: 41 O2pulse: 10 (77% predicted O2pulse)  - Echo 12/09/13 EF 35% echo but difficult study.  - Echo (3/16) with EF 30-35%, severe LV dilation with mild LVH, restrictive diastolic function, moderate MR, mildly decreased RV systolic function, PA systolic pressure 60 mmHg. ECHO 04/05/2015: EF 30-35%    ABIs (4/16) were normal.   Labs 11/28/13 Pro BNP  948  Labs 12/23/13 Dig level 0.6, K 4.6,  Creatinine 0.86 , Pro BNP 248 Labs 02/23/14 K 3.2 Cr 0.69 Labs 8/15 K 3.7, creatinine 0.78 Labs 12/15 K 4.5, creatinine 0.98, BNP 172, TSH normal Labs 3/16 TSH normal, LFTs normal, digoxin 0.9 Labs 4/16 K 3.7, creatinine 1.06, BNP 235, HCT 42.8 Laba 12/21/2014: K 4.8 Creatine 1.33 Labs 01/20/2015: K 4.8 Creatinine 1.33  Labs 01/26/15: K 4.2, creatinine 1.21, BNP 224, HCT 41.7 Labs 04/15/15: K 3.8 Creatinine 1.35 Hgb 12.7 Mag 1.9 Labs 04/28/2015 K 3.4 Creatinine 2.07  Labs 05/03/2015: INR 1.3   FH: Father alive with HF   SH: No  ETOH and does not smoke, lives in [place] with husband and 50 yr old daughter   ROS: All systems negative except as listed in HPI, PMH and Problem List.  Past Medical History  Diagnosis Date  . CHF (congestive heart failure)     a. EF 30-35%, RV mildly dilated (difficult study 11/2013) b. RHC (12/2013): RA 38/37 (35), RV 90/28, PA 95/51 (71), PCWP 54, PA 40%, AO 96 %, CO/CI Fick 3.2/1.4, PVR 5.3   . Nonischemic cardiomyopathy 12/2013    a. LHC (12/2013) normal coronary anatomy   . Mitral regurgitation     moderate to severe  . LBBB (left bundle branch block)   . HTN (hypertension)   . Asthma   . IBS (irritable bowel syndrome)     with primarily constipation  . Morbid obesity   . GERD (gastroesophageal reflux disease)   . IDA (iron deficiency anemia)     2o TO SB AVMS, Hx parenteral iron Dr Tressie Stalker  . AVM (arteriovenous malformation)     Small bowel, s/p duble balloon enteroscopy/APC jejunum Dr Arsenio Loader [facility]) 01/23/2011  . Peripheral neuropathy   . OSA (obstructive sleep apnea)     on CPAP qhs  . Diabetes mellitus without complication   . Torsades de pointes     a. appropriate ICD therapy 12/2014 in setting of hypokalemia  . Pneumonia     Current Outpatient Prescriptions  Medication Sig Dispense Refill  . albuterol (PROVENTIL HFA;VENTOLIN HFA) 108 (90 BASE) MCG/ACT inhaler Inhale 2 puffs into the lungs every 6 (six) hours as needed for wheezing or shortness of breath.    . allopurinol (ZYLOPRIM) 300 MG tablet Take 1 tablet (300 mg total) by mouth daily. 30 tablet 6  . amiodarone (PACERONE) 200 MG tablet Take 1 tablet (200 mg total) by mouth 2 (two) times daily. 120 tablet 3  . CORLANOR 5 MG TABS tablet TAKE (1/2) TABLET BY MOUTH TWICE DAILY WITH A MEAL. 60 tablet 2  . cyclobenzaprine (FLEXERIL) 10 MG tablet Take 10 mg by mouth 3 (three) times daily as needed for muscle spasms.     . digoxin (LANOXIN) 0.125 MG tablet Take 0.5 tablets (0.0625 mg total) by mouth daily. 90 tablet 3   . docusate sodium (COLACE) 100 MG capsule Take 1 capsule (100 mg total) by mouth 2 (two) times daily as needed for mild constipation. 60 capsule 6  . DULoxetine (CYMBALTA) 30 MG capsule Take 30 mg by mouth daily.    . ferrous sulfate 325 (65 FE) MG tablet Take 325 mg by mouth 3 (three) times daily.    Marland Kitchen gabapentin (NEURONTIN) 300 MG capsule Take 600 mg by mouth 4 (four) times daily.    . hydrALAZINE (APRESOLINE) 25 MG tablet Take 0.5 tablets (12.5 mg total) by mouth every 8 (eight) hours. 90 tablet 6  . insulin aspart (  NOVOLOG) 100 UNIT/ML injection Inject 0-20 Units into the skin 3 (three) times daily with meals. (Patient taking differently: Inject 0-20 Units into the skin 3 (three) times daily with meals. Per sliding scale: 120 - 150 = 2 units, 151 - 200 = 4 units, 201 - 250 = 7 units, 251 - 300 = 11 units, 301 - 350 = 15 units, 351 and above = 20 units) 1 vial 5  . insulin detemir (LEVEMIR) 100 UNIT/ML injection Inject 0.58 mLs (58 Units total) into the skin 2 (two) times daily. 10 mL 11  . Linaclotide (LINZESS) 290 MCG CAPS capsule Take 1 capsule (290 mcg total) by mouth daily. 30 minutes before breakfast 30 capsule 5  . magnesium oxide (MAG-OX) 400 (241.3 MG) MG tablet Take 1 tablet (400 mg total) by mouth 2 (two) times daily. 60 tablet 6  . metolazone (ZAROXOLYN) 2.5 MG tablet Take 30 minutes before taking morning dose of Torsemide on MONDAYS and THURSDAY. 8 tablet 6  . milrinone (PRIMACOR) 20 MG/100ML SOLN infusion Inject 47.5125 mcg/min into the vein continuous. 100 mL 6  . omeprazole (PRILOSEC) 40 MG capsule Take 1 capsule (40 mg total) by mouth daily. 30 capsule 6  . oxyCODONE-acetaminophen (PERCOCET) 10-325 MG per tablet Take 0.5-1 tablets by mouth every 4 (four) hours as needed for pain.     . potassium chloride SA (K-DUR,KLOR-CON) 20 MEQ tablet Take 60 meq am, 60 meq lunch, and 40 meq pm. On metolazone take and extra 20 meq in pm 270 tablet 6  . ranolazine (RANEXA) 500 MG 12 hr tablet  Take 1 tablet (500 mg total) by mouth 2 (two) times daily. 60 tablet 6  . sildenafil (REVATIO) 20 MG tablet Take 2 tablets (40 mg total) by mouth 3 (three) times daily. 120 tablet 0  . spironolactone (ALDACTONE) 25 MG tablet Take 1 tablet (25 mg total) by mouth 2 (two) times daily. 60 tablet 3  . temazepam (RESTORIL) 30 MG capsule Take 30 mg by mouth at bedtime.    . torsemide (DEMADEX) 100 MG tablet Take 1 tablet (100 mg total) by mouth 2 (two) times daily. 60 tablet 6  . vitamin E 400 UNIT capsule Take 400 Units by mouth daily.    Marland Kitchen warfarin (COUMADIN) 5 MG tablet Take 1 tablet (5 mg total) by mouth daily. Take as directed by LVAD clinic 45 tablet 6   No current facility-administered medications for this encounter.    There were no vitals filed for this visit.   PHYSICAL EXAM: General: Obese woman. No resp difficulty. Boyfriend and daughter present.    HEENT: normal Neck: Thick. JVP7-8  Carotids 2+ bilaterally; no bruits. No lymphadenopathy or thryomegaly appreciated. Cor: PMI normal. Regular rate & rhythm. No rubs, 2/6 HSM LLSB.  Unable to palpate pedal pulses. +S3 Lungs: clear Abdomen: obese, soft, nontender, mildly distended. No hepatosplenomegaly. No bruits or masses. Good bowel sounds. Extremities: no cyanosis, clubbing, rash.  No edema.  RUE PICC Neuro: alert & orientedx3, cranial nerves grossly intact. Moves all 4 extremities w/o difficulty. Affect pleasant.  ASSESSMENT & PLAN:  1. Chronic Systolic Heart Failure: Nonischemic cardiomyopathy, s/p [employer] CRT-D. EF 30-35% (03/2015). Now on Milrinone so CPX not applicable.   NYHA class IIIb symptoms on Milrinone 0.375 mcg.  Volume status stable. Continue current dose of torsemide and metolazone. -Continue current potassium dose.    -  Continue current spironolactone and digoxin.  -No BB with low output.  - Continue ivabradine 2.5 mg twice a  day. She had difficulty with 5 mg twice a day.  VAD coordinator met with her while  hospitalized.  Not sure she is candidate for LVAD due multiple social issues.  - Reinforced the need and importance of daily weights, a low sodium diet, and fluid restriction (less than 2 L a day). Instructed to call the HF clinic if weight increases more than 3 lbs overnight or 5 lbs in a week.  2. OSA: Using CPAP.  3. Morbid Obesity: Needs to lose weight. Discussed portions control.   4. VT/VF 03/26/2015  with ICD resuscitation: Amiodarone will only be adjusted per Dr Forde Dandy recommendations. Continue amiodarone 200 mg twice a day. Continue  magnesium to 400 mg twice a day. Continue current dose of potassium  5. Leg weakness: Difficult to palpate pedal pulses but ABIs normal in 4/16.  6. Dizziness: Ok for now.   7. H/O GI bleed with AVM. - no evidence of bleeding.  8. Pulmonary HTN- RHC 8//20/16 PVR 4.0 improved. Continue current dose of sildenafil.    Follow up in 2 weeks with 6MW. [employer] to interrogate device at that time.   Morning Halberg  NP-C 05/04/2015

## 2015-05-04 NOTE — Patient Instructions (Signed)
Medication Instructions:  Your physician recommends that you continue on your current medications as directed. Please refer to the Current Medication list given to you today.   Labwork: None ordered  Testing/Procedures: None ordered  Follow-Up: Your physician wants you to follow-up in: 3 MONTHS WITH DR. [name].   Any Other Special Instructions Will Be Listed Below (If Applicable).

## 2015-05-05 NOTE — Assessment & Plan Note (Signed)
She is currently on IV milrinone by PICC line. She is encouraged to maintain a low sodium diet.

## 2015-05-05 NOTE — Progress Notes (Signed)
HPI [name] returns today for followup of recurrent VT. She is over a year out from her BiV ICD implant. She has had multiple ICD shocks in the past several months. Her dose of amiodarone has been uptitrated.  She denies anginal symptoms.   Her heart failure symptoms have worsened. She has had no ICD shock in several weeks. She appears to bto be tolerating her increased dose of amio.  She notes that her blood pressure runs low usually and she will often have to stop what she is doing to rest. Denies peripheral edema.  Allergies  Allergen Reactions  . Cymbalta [Duloxetine Hcl] Other (See Comments)    Only in a mild dose pt can tolerate  . Trazodone And Nefazodone Other (See Comments)    Nightmares   . Lyrica [Pregabalin] Swelling     Current Outpatient Prescriptions  Medication Sig Dispense Refill  . albuterol (PROVENTIL HFA;VENTOLIN HFA) 108 (90 BASE) MCG/ACT inhaler Inhale 2 puffs into the lungs every 6 (six) hours as needed for wheezing or shortness of breath.    . allopurinol (ZYLOPRIM) 300 MG tablet Take 1 tablet (300 mg total) by mouth daily. 30 tablet 6  . amiodarone (PACERONE) 200 MG tablet Take 1 tablet (200 mg total) by mouth 2 (two) times daily. 120 tablet 3  . CORLANOR 5 MG TABS tablet TAKE (1/2) TABLET BY MOUTH TWICE DAILY WITH A MEAL. 60 tablet 2  . cyclobenzaprine (FLEXERIL) 10 MG tablet Take 10 mg by mouth 3 (three) times daily as needed for muscle spasms.     . digoxin (LANOXIN) 0.125 MG tablet Take 0.5 tablets (0.0625 mg total) by mouth daily. 90 tablet 3  . docusate sodium (COLACE) 100 MG capsule Take 1 capsule (100 mg total) by mouth 2 (two) times daily as needed for mild constipation. 60 capsule 6  . DULoxetine (CYMBALTA) 30 MG capsule Take 30 mg by mouth daily.    . ferrous sulfate 325 (65 FE) MG tablet Take 325 mg by mouth 3 (three) times daily.    Marland Kitchen gabapentin (NEURONTIN) 300 MG capsule Take 600 mg by mouth 4 (four) times daily.    . hydrALAZINE (APRESOLINE) 25 MG  tablet Take 0.5 tablets (12.5 mg total) by mouth every 8 (eight) hours. 90 tablet 6  . insulin aspart (NOVOLOG) 100 UNIT/ML injection Inject 0-20 Units into the skin 3 (three) times daily with meals. (Patient taking differently: Inject 0-20 Units into the skin 3 (three) times daily with meals. Per sliding scale: 120 - 150 = 2 units, 151 - 200 = 4 units, 201 - 250 = 7 units, 251 - 300 = 11 units, 301 - 350 = 15 units, 351 and above = 20 units) 1 vial 5  . insulin detemir (LEVEMIR) 100 UNIT/ML injection Inject 0.58 mLs (58 Units total) into the skin 2 (two) times daily. 10 mL 11  . Linaclotide (LINZESS) 290 MCG CAPS capsule Take 1 capsule (290 mcg total) by mouth daily. 30 minutes before breakfast 30 capsule 5  . magnesium oxide (MAG-OX) 400 (241.3 MG) MG tablet Take 1 tablet (400 mg total) by mouth 2 (two) times daily. 60 tablet 6  . metolazone (ZAROXOLYN) 2.5 MG tablet Take 30 minutes before taking morning dose of Torsemide on MONDAYS and THURSDAY. 8 tablet 6  . milrinone (PRIMACOR) 20 MG/100ML SOLN infusion Inject 47.5125 mcg/min into the vein continuous. 100 mL 6  . omeprazole (PRILOSEC) 40 MG capsule Take 1 capsule (40 mg total) by mouth daily. 30 capsule  6  . oxyCODONE-acetaminophen (PERCOCET) 10-325 MG per tablet Take 0.5-1 tablets by mouth every 4 (four) hours as needed for pain.     . potassium chloride SA (K-DUR,KLOR-CON) 20 MEQ tablet Take 60 meq am, 60 meq lunch, and 40 meq pm. On metolazone take and extra 20 meq in pm 270 tablet 6  . ranolazine (RANEXA) 500 MG 12 hr tablet Take 1 tablet (500 mg total) by mouth 2 (two) times daily. 60 tablet 6  . sildenafil (REVATIO) 20 MG tablet Take 2 tablets (40 mg total) by mouth 3 (three) times daily. 120 tablet 0  . spironolactone (ALDACTONE) 25 MG tablet Take 1 tablet (25 mg total) by mouth 2 (two) times daily. 60 tablet 3  . temazepam (RESTORIL) 30 MG capsule Take 30 mg by mouth at bedtime.    . torsemide (DEMADEX) 100 MG tablet Take 1 tablet (100 mg  total) by mouth 2 (two) times daily. 60 tablet 6  . vitamin E 400 UNIT capsule Take 400 Units by mouth daily.    Marland Kitchen warfarin (COUMADIN) 5 MG tablet Take 1 tablet (5 mg total) by mouth daily. Take as directed by LV[facility] clinic 45 tablet 6   No current facility-administered medications for this visit.     Past Medical History  Diagnosis Date  . CHF (congestive heart failure)     a. EF 30-35%, RV mildly dilated (difficult study 11/2013) b. RHC (12/2013): RA 38/37 (35), RV 90/28, PA 95/51 (71), PCWP 54, PA 40%, AO 96 %, CO/CI Fick 3.2/1.4, PVR 5.3   . Nonischemic cardiomyopathy 12/2013    a. LHC (12/2013) normal coronary anatomy   . Mitral regurgitation     moderate to severe  . LBBB (left bundle branch block)   . HTN (hypertension)   . Asthma   . IBS (irritable bowel syndrome)     with primarily constipation  . Morbid obesity   . GERD (gastroesophageal reflux disease)   . IDA (iron deficiency anemia)     2o TO SB AVMS, Hx parenteral iron Dr Mariel Sleet  . AVM (arteriovenous malformation)     Small bowel, s/p duble balloon enteroscopy/APC jejunum Dr Gwinda Passe [facility]) 01/23/2011  . Peripheral neuropathy   . OSA (obstructive sleep apnea)     on CPAP qhs  . Diabetes mellitus without complication   . Torsades de pointes     a. appropriate ICD therapy 12/2014 in setting of hypokalemia  . Pneumonia     ROS:   All systems reviewed and negative except as noted in the HPI.   Past Surgical History  Procedure Laterality Date  . Appendectomy    . Cholecystectomy      biliary dyskinesia  . Mastectomy  2003    left, partial  . Knee arthroscopy  2005    left  . Small bowel enteroscopy  MAY 2012 D[facility] DR. GILLIAM    SB AVMS s/p APC  . Colonoscopy  OCT 2010/SEP 2011    tortuous colon, 3 polyps-benign(2010),   . Upper gastrointestinal endoscopy  '08, '10, SEP 2011    mild antral gastritis (2010), negative SB bx (2010), incomlpete Schatzki's ring (2011)  . Small bowel enteroscopy  SEP 2011  PUSH [facility]    Fields-NO AVMS  . Givens capsule study  NOV 2010     NO AVMS, normal  . Irrigation and debridement abscess  06/25/2012    Procedure: MINOR INCISION AND DRAINAGE OF ABSCESS;  Surgeon: [name], MD;  Location: [facility] ORS;  Service: General;  Laterality: [name];  Incision & Drainage of Infected Sebaceous Cyst on Chest  . Breast surgery    . Implantable cardioverter defibrillator generator change Left 02/15/2012    BSX CRTD   . Left and right heart catheterization with coronary/graft angiogram  12/12/2013    Procedure: LEFT AND RIGHT HEART CATHETERIZATION WITH Isabel Caprice;  Surgeon: Peter M Swaziland, MD;  Location: [facility] CATH LAB;  Service: Cardiovascular;;  . Right heart catheterization [name] 01/06/2015    Procedure: RIGHT HEART CATH;  Surgeon: Laurey Morale, MD;  Location: [facility] CATH LAB;  Service: Cardiovascular;  Laterality: [name];  . Esophagogastroduodenoscopy  Sept 2013    [facility]: diminutive duodenal polyp, polyp in gastric cardia, path with benign duodenal mucosa with focally prominent Brunner's glands. Fundic gland polyps.   . Cardiac catheterization [name] 03/31/2015    Procedure: Right Heart Cath;  Surgeon: Dolores Patty, MD;  Location: [facility] INVASIVE CV LAB;  Service: Cardiovascular;  Laterality: [name];  . Cardiac catheterization [name] 04/23/2015    Procedure: Right Heart Cath;  Surgeon: Dolores Patty, MD;  Location: [facility] INVASIVE CV LAB;  Service: Cardiovascular;  Laterality: [name];     Family History  Problem Relation Age of Onset  . Colon cancer Paternal Uncle   . Cervical cancer Mother   . Hypertension Mother   . Cancer Mother   . Heart disease Father   . Hypertension Father   . GER disease Father   . Heart attack Father   . Bleeding Disorder Father   . Diabetes Sister   . Heart disease Sister   . Hypertension Sister      Social History   Social History  . Marital Status: Single    Spouse Name: [name]  . Number of Children: 1  . Years of Education: [name]    Occupational History  . disabled    Social History Main Topics  . Smoking status: Former Smoker -- 0.00 packs/day for 0 years    Types: Cigarettes  . Smokeless tobacco: Former Neurosurgeon     Comment: Quit x 4 years  . Alcohol Use: No  . Drug Use: No  . Sexual Activity: Not on file   Other Topics Concern  . 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   Single- 1 daughter age 19.    Does not get regular exercise.       BP 122/74 mmHg  Pulse 81  Ht 5\' 5"  (1.651 m)  Wt 263 lb (119.296 kg)  BMI 43.77 kg/m2  LMP 01/19/2014  Physical Exam:  obese appearing 49 year old woman, NAD HEENT: Unremarkable Neck:  6 cm JVD, no thyromegally Back:  No CVA tenderness Lungs:  Clear with no wheezes, rales, or rhonchi. HEART:  Regular rate rhythm, distant, no murmurs, no rubs, no clicks, heart sounds are distant Abd:  soft, positive bowel sounds, no organomegally, no rebound, no guarding Ext:  2 plus pulses, no edema, no cyanosis, no clubbing Skin:  No rashes no nodules Neuro:  CN II through XII intact, motor grossly intact   DEVICE  Normal device function.  See PaceArt for details.   Assess/Plan:

## 2015-05-05 NOTE — Assessment & Plan Note (Signed)
Her [place] device is working normally. Will recheck in several months. 

## 2015-05-05 NOTE — Assessment & Plan Note (Signed)
She has had no VT with uptitration of her amiodarone. I plan to keep her on 400 mg a day for 3 more months. If her VT remains quiet will plan to reduce her dose to 300 mg daily.

## 2015-05-06 ENCOUNTER — Telehealth (HOSPITAL_COMMUNITY): Payer: Self-pay | Admitting: Cardiology

## 2015-05-06 MED ORDER — POTASSIUM CHLORIDE CRYS ER 20 MEQ PO TBCR: 60.0000 meq | EXTENDED_RELEASE_TABLET | Freq: Two times a day (BID) | ORAL | Status: DC

## 2015-05-06 NOTE — Telephone Encounter (Signed)
Pt aware and agreeable, she verbalizes understanding

## 2015-05-06 NOTE — Telephone Encounter (Signed)
Called with FYI on patients labs K 5.4 Cr 1.88 BUN 51

## 2015-05-06 NOTE — Telephone Encounter (Signed)
Cr improved from discharge.  Change K to 60 BID with extra 20 meq on metolazone days per Dr [name].   Casimiro Needle [address]" [place], [place] 05/06/2015 3:38 PM

## 2015-05-07 ENCOUNTER — Other Ambulatory Visit: Payer: Self-pay | Admitting: *Deleted

## 2015-05-07 NOTE — Patient Outreach (Signed)
[facility]) Care Management  05/07/2015  SAMYIA TARAN 1966-07-11 [id number]  Unsuccessful phone outreach attempt. Will attempt contact again on Monday.    Marja Kays MHA,BSN,RN,CCM [facility] Care Management  [phone]

## 2015-05-09 DIAGNOSIS — Z7189 Other specified counseling: Secondary | ICD-10-CM

## 2015-05-09 DIAGNOSIS — Z515 Encounter for palliative care: Secondary | ICD-10-CM

## 2015-05-10 ENCOUNTER — Telehealth (HOSPITAL_COMMUNITY): Payer: Self-pay | Admitting: *Deleted

## 2015-05-10 ENCOUNTER — Ambulatory Visit (HOSPITAL_COMMUNITY): Payer: Self-pay | Admitting: Infectious Diseases

## 2015-05-10 LAB — POCT INR: INR: 3.7

## 2015-05-10 NOTE — Telephone Encounter (Signed)
[facility] RN called to report pt's HR is in the low 40s today, she states she got 40s on her pulse ox so she checked apical and got 41 and repeat was 42, she states other VS stable, wt stable, pt does c/o feeling more fatigue but denies dizziness or lightheadedness.  Discussed w/Dr [name] and he would like pt to stop Corlanor.  Orders given to RN she is going to fix pt's pill box and will leave that out, she is sch to see pt again on Wed and will let us know if HR still low

## 2015-05-12 ENCOUNTER — Ambulatory Visit (INDEPENDENT_AMBULATORY_CARE_PROVIDER_SITE_OTHER): Payer: Medicare Other | Admitting: *Deleted

## 2015-05-12 ENCOUNTER — Other Ambulatory Visit (HOSPITAL_COMMUNITY)
Admission: RE | Admit: 2015-05-12 | Discharge: 2015-05-12 | Disposition: A | Discharge status: Home / Self Care | Payer: Medicare Other | Source: Skilled Nursing Facility | Attending: Internal Medicine | Admitting: Internal Medicine

## 2015-05-12 ENCOUNTER — Other Ambulatory Visit: Payer: Self-pay | Admitting: *Deleted

## 2015-05-12 ENCOUNTER — Telehealth (HOSPITAL_COMMUNITY): Payer: Self-pay | Admitting: *Deleted

## 2015-05-12 VITALS — Wt 261.0 lb

## 2015-05-12 DIAGNOSIS — R001 Bradycardia, unspecified: Secondary | ICD-10-CM | POA: Insufficient documentation

## 2015-05-12 DIAGNOSIS — Z5181 Encounter for therapeutic drug level monitoring: Secondary | ICD-10-CM | POA: Diagnosis present

## 2015-05-12 LAB — BASIC METABOLIC PANEL
Anion gap: 9 (ref 5–15)
BUN: 47 mg/dL — ABNORMAL HIGH (ref 6–20)
CALCIUM: 8.7 mg/dL — AB (ref 8.9–10.3)
CO2: 33 mmol/L — AB (ref 22–32)
CREATININE: 1.7 mg/dL — AB (ref 0.44–1.00)
Chloride: 93 mmol/L — ABNORMAL LOW (ref 101–111)
GFR calc non Af Amer: 34 mL/min — ABNORMAL LOW (ref >60)
GFR, EST AFRICAN AMERICAN: 40 mL/min — AB (ref >60)
GLUCOSE: 119 mg/dL — AB (ref 65–99)
Potassium: 3.4 mmol/L — ABNORMAL LOW (ref 3.5–5.1)
Sodium: 135 mmol/L (ref 135–145)

## 2015-05-12 LAB — CBC WITH DIFFERENTIAL/PLATELET
BASOS PCT: 1 % (ref 0–1)
Basophils Absolute: 0 10*3/uL (ref 0.0–0.1)
EOS ABS: 0.2 10*3/uL (ref 0.0–0.7)
Eosinophils Relative: 4 % (ref 0–5)
HCT: 41.3 % (ref 36.0–46.0)
Hemoglobin: 13.5 g/dL (ref 12.0–15.0)
Lymphocytes Relative: 27 % (ref 12–46)
Lymphs Abs: 1.4 10*3/uL (ref 0.7–4.0)
MCH: 31 pg (ref 26.0–34.0)
MCHC: 32.7 g/dL (ref 30.0–36.0)
MCV: 94.9 fL (ref 78.0–100.0)
MONO ABS: 0.4 10*3/uL (ref 0.1–1.0)
MONOS PCT: 7 % (ref 3–12)
Neutro Abs: 3.3 10*3/uL (ref 1.7–7.7)
Neutrophils Relative %: 61 % (ref 43–77)
Platelets: 293 10*3/uL (ref 150–400)
RBC: 4.35 MIL/uL (ref 3.87–5.11)
RDW: 14.8 % (ref 11.5–15.5)
WBC: 5.3 10*3/uL (ref 4.0–10.5)

## 2015-05-12 LAB — MAGNESIUM: MAGNESIUM: 1.9 mg/dL (ref 1.7–2.4)

## 2015-05-12 LAB — DIGOXIN LEVEL: DIGOXIN LVL: 0.8 ng/mL (ref 0.8–2.0)

## 2015-05-12 NOTE — Telephone Encounter (Signed)
Left vm for pt to call back.  Need to make sure patient is taking her K. (per andy)

## 2015-05-12 NOTE — Telephone Encounter (Signed)
Received labs done this AM: Dig level 0.8 K 3.4 Bun 47 CR 1.70 Mag 1.9  Will send to Dr [name] to review

## 2015-05-12 NOTE — Telephone Encounter (Signed)
Dr [name] aware of lab results, he recommends pt stop digoxin, keep amio at current dose and get EKG.  Pt is aware and agreeable, she has a hard time getting to [facility] so have arranged for pt to have EKG in [place] office at 3:30, will have Dr [name] review results in Epic and call pt with results and any further recommendations

## 2015-05-12 NOTE — Patient Outreach (Signed)
[facility]) Care Management  05/11/2015  [name] 07/25/1966 [id number]   I spoke with [name] by phone today to follow up on her progess with CHF disease management. She reports that she is still receiving Milrinone via PICC line with [name] nurse visiting 3 times weekly. She tells me that she is being seen regularly in the heart failure clinic. She last saw Dr. Arne Cleveland NP on 05/04/15 and is scheduled to be seen in the clinic again on Tuesday, 05/18/15. I offered to make a home visit/schedule an appointment with her but she declined saying that her schedule was quite busy with MD appointments and nurse visits.   I will contact her again next week to review her progress.    Marja Kays MHA,BSN,RN,CCM [facility] Care Management  [phone]

## 2015-05-12 NOTE — Telephone Encounter (Signed)
Dr [name] reviewed pt's EKG which revealed ventricular bigeminy HR 81 he recommends stopping digoxin for now.    Discussed results with pt, she is aware and agreeable, will stop Dig.  Also discussed KCL she states she is taking 60 meq bid as directed to do last week, advised will let MD know if changes need to be made we will call her tomorrow

## 2015-05-12 NOTE — Telephone Encounter (Signed)
Stop digoxin.  Needs to get ECG also.  Decrease amiodarone to once daily with HR so low as well.

## 2015-05-12 NOTE — Progress Notes (Signed)
Performed EKG HR 81 PVC's and bigeminy . Pt says she is tired and SOB, denies chest pain. Had DOD review EKG before releasing pt and will forward to Dr. Jearld Pies

## 2015-05-12 NOTE — Telephone Encounter (Signed)
Huntley Dec, RN called to report pt's HR is still low at 40, she states pt had an episode of presyncope yesterday at [employer] but came home and rested and then felt fine.  She states pts wt is stable at 261 lb, BP 116/90 98% on RA, pt is asymptomatic now and only c/o fatigue.  Pt did stop Corlanor and has not had a dose since Monday AM.  She is drawing pt's weekly labs and will run them stat.  Reviewed pt's med list w/Erika pharm D she recommends pt also have Dig level.  Huntley Dec is aware and will draw that with other labs.  Will await lab results and will send to Dr [name] for review

## 2015-05-13 MED ORDER — POTASSIUM CHLORIDE CRYS ER 20 MEQ PO TBCR: EXTENDED_RELEASE_TABLET | ORAL | Status: DC

## 2015-05-13 NOTE — Telephone Encounter (Signed)
Per [name], PA have pt increase KCL to 80 meq in AM and 60 meq in PM, pt aware and agreeable

## 2015-05-14 ENCOUNTER — Other Ambulatory Visit (HOSPITAL_COMMUNITY): Payer: Self-pay | Admitting: Internal Medicine

## 2015-05-14 ENCOUNTER — Other Ambulatory Visit (HOSPITAL_COMMUNITY): Payer: Self-pay | Admitting: Adult Health

## 2015-05-15 ENCOUNTER — Telehealth: Payer: Self-pay | Admitting: Cardiology

## 2015-05-15 NOTE — Telephone Encounter (Signed)
Felt weak and SOB. Home health RN presented to evaluate. I was paged by [facility] RN to discuss possibility of giving an additional dose of diuretic.   Per RN: Weight stable. Lungs clear. Oxygen 98% on 2L, which is chronic.  RR 20. Edema stable. Has been eating and drinking normally. No missed medication doses. Did recently have 2 days of loose stools, 5x/d x2 days. No melena or hematochezia. She cannot lay flat but this is chronic.   She has symptoms of volume overload. It is reasonable to try an additional dose of 100mg  PO torsemide tonight.  I am hesitant to give metolazone given recent diarrhea and history of VT in setting of hypokalemia.   Plan: 1. 100mg  PO torsemide tonight 2. If she is not feeling better by tomorrow, she will need evaluation. Since it is the holiday weekend, she will need to come to the ER for labs, CXR, etc. 3. She has a follow-up appt already schedule for Tues AM.

## 2015-05-16 ENCOUNTER — Encounter (HOSPITAL_COMMUNITY): Payer: Self-pay | Admitting: *Deleted

## 2015-05-16 ENCOUNTER — Emergency Department (HOSPITAL_COMMUNITY): Payer: Medicare Other

## 2015-05-16 ENCOUNTER — Emergency Department (HOSPITAL_COMMUNITY)
Admission: EM | Admit: 2015-05-16 | Discharge: 2015-05-16 | Disposition: A | Discharge status: Home / Self Care | Payer: Medicare Other | Attending: Emergency Medicine | Admitting: Emergency Medicine

## 2015-05-16 DIAGNOSIS — Z7901 Long term (current) use of anticoagulants: Secondary | ICD-10-CM | POA: Diagnosis not present

## 2015-05-16 DIAGNOSIS — I5022 Chronic systolic (congestive) heart failure: Secondary | ICD-10-CM | POA: Insufficient documentation

## 2015-05-16 DIAGNOSIS — Z8701 Personal history of pneumonia (recurrent): Secondary | ICD-10-CM | POA: Insufficient documentation

## 2015-05-16 DIAGNOSIS — Z79899 Other long term (current) drug therapy: Secondary | ICD-10-CM | POA: Diagnosis not present

## 2015-05-16 DIAGNOSIS — Z9981 Dependence on supplemental oxygen: Secondary | ICD-10-CM | POA: Diagnosis not present

## 2015-05-16 DIAGNOSIS — Z794 Long term (current) use of insulin: Secondary | ICD-10-CM | POA: Insufficient documentation

## 2015-05-16 DIAGNOSIS — Z862 Personal history of diseases of the blood and blood-forming organs and certain disorders involving the immune mechanism: Secondary | ICD-10-CM | POA: Diagnosis not present

## 2015-05-16 DIAGNOSIS — J45909 Unspecified asthma, uncomplicated: Secondary | ICD-10-CM | POA: Diagnosis not present

## 2015-05-16 DIAGNOSIS — G4733 Obstructive sleep apnea (adult) (pediatric): Secondary | ICD-10-CM | POA: Diagnosis not present

## 2015-05-16 DIAGNOSIS — E119 Type 2 diabetes mellitus without complications: Secondary | ICD-10-CM | POA: Diagnosis not present

## 2015-05-16 DIAGNOSIS — K219 Gastro-esophageal reflux disease without esophagitis: Secondary | ICD-10-CM | POA: Diagnosis not present

## 2015-05-16 DIAGNOSIS — Z87891 Personal history of nicotine dependence: Secondary | ICD-10-CM | POA: Diagnosis not present

## 2015-05-16 DIAGNOSIS — I10 Essential (primary) hypertension: Secondary | ICD-10-CM | POA: Diagnosis not present

## 2015-05-16 DIAGNOSIS — R0602 Shortness of breath: Secondary | ICD-10-CM | POA: Diagnosis present

## 2015-05-16 LAB — BASIC METABOLIC PANEL
ANION GAP: 12 (ref 5–15)
BUN: 48 mg/dL — ABNORMAL HIGH (ref 6–20)
CHLORIDE: 96 mmol/L — AB (ref 101–111)
CO2: 30 mmol/L (ref 22–32)
Calcium: 9.1 mg/dL (ref 8.9–10.3)
Creatinine, Ser: 1.9 mg/dL — ABNORMAL HIGH (ref 0.44–1.00)
GFR calc Af Amer: 35 mL/min — ABNORMAL LOW (ref >60)
GFR calc non Af Amer: 30 mL/min — ABNORMAL LOW (ref >60)
GLUCOSE: 112 mg/dL — AB (ref 65–99)
POTASSIUM: 4.1 mmol/L (ref 3.5–5.1)
Sodium: 138 mmol/L (ref 135–145)

## 2015-05-16 LAB — CBC
HEMATOCRIT: 40.8 % (ref 36.0–46.0)
HEMOGLOBIN: 13.1 g/dL (ref 12.0–15.0)
MCH: 30.3 pg (ref 26.0–34.0)
MCHC: 32.1 g/dL (ref 30.0–36.0)
MCV: 94.2 fL (ref 78.0–100.0)
Platelets: 277 10*3/uL (ref 150–400)
RBC: 4.33 MIL/uL (ref 3.87–5.11)
RDW: 15.1 % (ref 11.5–15.5)
WBC: 5.6 10*3/uL (ref 4.0–10.5)

## 2015-05-16 LAB — TROPONIN I: Troponin I: <0.03 ng/mL (ref <0.031)

## 2015-05-16 MED ORDER — SODIUM CHLORIDE 0.9 % IV SOLN
Freq: Once | INTRAVENOUS | Status: AC
  Administered 2015-05-16: 13:00:00 via INTRAVENOUS

## 2015-05-16 MED ORDER — FUROSEMIDE 10 MG/ML IJ SOLN
80.0000 mg | Freq: Once | INTRAMUSCULAR | Status: AC
  Administered 2015-05-16: 80 mg via INTRAVENOUS
  Filled 2015-05-16: qty 8

## 2015-05-16 MED ORDER — HEPARIN SOD (PORK) LOCK FLUSH 100 UNIT/ML IV SOLN
250.0000 [IU] | INTRAVENOUS | Status: AC | PRN
  Administered 2015-05-16: 250 [IU]

## 2015-05-16 MED ORDER — ACETAMINOPHEN 325 MG PO TABS
650.0000 mg | ORAL_TABLET | Freq: Once | ORAL | Status: AC
  Administered 2015-05-16: 650 mg via ORAL
  Filled 2015-05-16: qty 2

## 2015-05-16 NOTE — ED Notes (Signed)
Called pt twice for triage  with no answer

## 2015-05-16 NOTE — Discharge Instructions (Signed)
Keep your scheduled appointment with the heart failure clinic in 2 days. Return if concern for any reason

## 2015-05-16 NOTE — ED Notes (Signed)
Per Dr. Ethelda Chick pt can take her home medications that she has with her. Pt will take gabapentin, iron and hydralazine.

## 2015-05-16 NOTE — ED Notes (Signed)
Verified with IV team that it is ok to draw labs and use second port for medications.

## 2015-05-16 NOTE — ED Notes (Signed)
Pulse manually check and MD aware

## 2015-05-16 NOTE — ED Notes (Signed)
Pt discharged and waiting for her husband to bring her clothing and oxygen.

## 2015-05-16 NOTE — ED Provider Notes (Signed)
CSN: [id number]     Arrival date & time 05/16/15  1157 History   First MD Initiated Contact with Patient 05/16/15 1256     Chief Complaint  Patient presents with  . Shortness of Breath     (Consider location/radiation/quality/duration/timing/severity/associated sxs/prior Treatment) HPI Plan shortness of breath onset 2 days ago. Associated symptoms include 6 pound weight gain over the past 2 days. Her medications including milrinone drip were delivered to her home this past week however Lasix was left out of the package, which she takes intravenously on an as-needed basis. She was treated with torsemide 100 mg by mouth last night, without relief. No other associated symptoms. She will tired sitting upright to sleep last night. She normally sleeps on 2 pillows. He denies other symptoms. No chest pain. No cough. No fever. Symptoms worse with lying flat improved with sitting up Past Medical History  Diagnosis Date  . CHF (congestive heart failure)     a. EF 30-35%, RV mildly dilated (difficult study 11/2013) b. RHC (12/2013): RA 38/37 (35), RV 90/28, PA 95/51 (71), PCWP 54, PA 40%, AO 96 %, CO/CI Fick 3.2/1.4, PVR 5.3   . Nonischemic cardiomyopathy 12/2013    a. LHC (12/2013) normal coronary anatomy   . Mitral regurgitation     moderate to severe  . LBBB (left bundle branch block)   . HTN (hypertension)   . Asthma   . IBS (irritable bowel syndrome)     with primarily constipation  . Morbid obesity   . GERD (gastroesophageal reflux disease)   . IDA (iron deficiency anemia)     2o TO SB AVMS, Hx parenteral iron Dr Mariel Sleet  . AVM (arteriovenous malformation)     Small bowel, s/p duble balloon enteroscopy/APC jejunum Dr Gwinda Passe [facility]) 01/23/2011  . Peripheral neuropathy   . OSA (obstructive sleep apnea)     on CPAP qhs  . Diabetes mellitus without complication   . Torsades de pointes     a. appropriate ICD therapy 12/2014 in setting of hypokalemia  . Pneumonia    Past Surgical History   Procedure Laterality Date  . Appendectomy    . Cholecystectomy      biliary dyskinesia  . Mastectomy  2003    left, partial  . Knee arthroscopy  2005    left  . Small bowel enteroscopy  MAY 2012 [facility] DR. GILLIAM    SB AVMS s/p APC  . Colonoscopy  OCT 2010/SEP 2011    tortuous colon, 3 polyps-benign(2010),   . Upper gastrointestinal endoscopy  '08, '10, SEP 2011    mild antral gastritis (2010), negative SB bx (2010), incomlpete Schatzki's ring (2011)  . Small bowel enteroscopy  SEP 2011 [facility]    Fields-NO AVMS  . Givens capsule study  NOV 2010     NO AVMS, normal  . Irrigation and debridement abscess  06/25/2012    Procedure: MINOR INCISION AND DRAINAGE OF ABSCESS;  Surgeon: [name], MD;  Location: [facility] ORS;  Service: General;  Laterality: [name];  Incision & Drainage of Infected Sebaceous Cyst on Chest  . Breast surgery    . Implantable cardioverter defibrillator generator change Left 02/15/2012    BSX CRTD   . Left and right heart catheterization with coronary/graft angiogram  12/12/2013    Procedure: LEFT AND RIGHT HEART CATHETERIZATION WITH Isabel Caprice;  Surgeon: Peter M Swaziland, MD;  Location: [facility] CATH LAB;  Service: Cardiovascular;;  . Right heart catheterization [name] 01/06/2015    Procedure:  RIGHT HEART CATH;  Surgeon: Laurey Morale, MD;  Location: [facility] CATH LAB;  Service: Cardiovascular;  Laterality: [name];  . Esophagogastroduodenoscopy  Sept 2013    [facility]: diminutive duodenal polyp, polyp in gastric cardia, path with benign duodenal mucosa with focally prominent Brunner's glands. Fundic gland polyps.   . Cardiac catheterization [name] 03/31/2015    Procedure: Right Heart Cath;  Surgeon: Dolores Patty, MD;  Location: [facility] INVASIVE CV LAB;  Service: Cardiovascular;  Laterality: [name];  . Cardiac catheterization [name] 04/23/2015    Procedure: Right Heart Cath;  Surgeon: Dolores Patty, MD;  Location: [facility] INVASIVE CV LAB;  Service: Cardiovascular;  Laterality:  [name];   Family History  Problem Relation Age of Onset  . Colon cancer Paternal Uncle   . Cervical cancer Mother   . Hypertension Mother   . Cancer Mother   . Heart disease Father   . Hypertension Father   . GER disease Father   . Heart attack Father   . Bleeding Disorder Father   . Diabetes Sister   . Heart disease Sister   . Hypertension Sister    Social History  Substance Use Topics  . Smoking status: Former Smoker -- 0.00 packs/day for 0 years    Types: Cigarettes  . Smokeless tobacco: Former Neurosurgeon     Comment: Quit x 4 years  . Alcohol Use: No   OB History    Gravida Para Term Preterm AB TAB SAB Ectopic Multiple Living   6 1 1  5  3 2        Review of Systems  Constitutional: Positive for unexpected weight change.  Respiratory: Positive for shortness of breath.   Neurological: Positive for headaches.       Intermittent diffuse headaches over the past month. Presently complaining of headache  All other systems reviewed and are negative.     Allergies  Cymbalta; Trazodone and nefazodone; and Lyrica  Home Medications   Prior to Admission medications   Medication Sig Start Date End Date Taking? Authorizing Provider  albuterol (PROVENTIL HFA;VENTOLIN HFA) 108 (90 BASE) MCG/ACT inhaler Inhale 2 puffs into the lungs every 6 (six) hours as needed for wheezing or shortness of breath.    Historical Provider, MD  allopurinol (ZYLOPRIM) 300 MG tablet Take 1 tablet (300 mg total) by mouth daily. 08/14/14   [name] D Filbert Schilder, NP  amiodarone (PACERONE) 200 MG tablet Take 1 tablet (200 mg total) by mouth 2 (two) times daily. 04/08/15   [name] D Filbert Schilder, NP  cyclobenzaprine (FLEXERIL) 10 MG tablet Take 10 mg by mouth 3 (three) times daily as needed for muscle spasms.     Historical Provider, MD  digoxin (LANOXIN) 0.125 MG tablet Take 0.5 tablets (0.0625 mg total) by mouth daily. 01/01/15   [name] D Filbert Schilder, NP  docusate sodium (COLACE) 100 MG capsule Take 1 capsule (100 mg total) by mouth 2 (two)  times daily as needed for mild constipation. 04/28/15   [name] D Clegg, NP  DULoxetine (CYMBALTA) 30 MG capsule Take 30 mg by mouth daily. 02/24/15   Historical Provider, MD  ferrous sulfate 325 (65 FE) MG tablet Take 325 mg by mouth 3 (three) times daily.    Historical Provider, MD  gabapentin (NEURONTIN) 300 MG capsule Take 600 mg by mouth 4 (four) times daily.    Historical Provider, MD  hydrALAZINE (APRESOLINE) 25 MG tablet Take 0.5 tablets (12.5 mg total) by mouth every 8 (eight) hours. 04/28/15   Sherald Hess, NP  insulin aspart (NOVOLOG) 100 UNIT/ML injection Inject 0-20 Units into the skin 3 (three) times daily with meals. Patient taking differently: Inject 0-20 Units into the skin 3 (three) times daily with meals. Per sliding scale: 120 - 150 = 2 units, 151 - 200 = 4 units, 201 - 250 = 7 units, 251 - 300 = 11 units, 301 - 350 = 15 units, 351 and above = 20 units 06/28/12   Gareth Morgan, MD  insulin detemir (LEVEMIR) 100 UNIT/ML injection Inject 0.58 mLs (58 Units total) into the skin 2 (two) times daily. 04/08/15   [name] D Filbert Schilder, NP  Linaclotide (LINZESS) 290 MCG CAPS capsule Take 1 capsule (290 mcg total) by mouth daily. 30 minutes before breakfast 02/12/15   Nira Retort, NP  magnesium oxide (MAG-OX) 400 (241.3 MG) MG tablet Take 1 tablet (400 mg total) by mouth 2 (two) times daily. 04/15/15   [name] D Clegg, NP  metolazone (ZAROXOLYN) 2.5 MG tablet Take 30 minutes before taking morning dose of Torsemide on MONDAYS and THURSDAY. 04/28/15   [name] D Clegg, NP  milrinone (PRIMACOR) 20 MG/100ML SOLN infusion Inject 47.5125 mcg/min into the vein continuous. 04/28/15   [name] D Filbert Schilder, NP  omeprazole (PRILOSEC) 40 MG capsule Take 1 capsule (40 mg total) by mouth daily. 03/27/13   Marinus Maw, MD  oxyCODONE-acetaminophen (PERCOCET) 10-325 MG per tablet Take 0.5-1 tablets by mouth every 4 (four) hours as needed for pain.     Historical Provider, MD  potassium chloride SA (K-DUR,KLOR-CON) 20 MEQ tablet Take 4 tabs in AM  and 3 tabs in PM, Take an extra tab when you take metolazone 05/13/15   Dolores Patty, MD  ranolazine (RANEXA) 500 MG 12 hr tablet Take 1 tablet (500 mg total) by mouth 2 (two) times daily. 04/08/15   [name] D Filbert Schilder, NP  sildenafil (REVATIO) 20 MG tablet Take 2 tablets (40 mg total) by mouth 3 (three) times daily. 04/28/15   [name] D Clegg, NP  spironolactone (ALDACTONE) 25 MG tablet TAKE 1 TABLET BY MOUTH TWICE DAILY. 05/14/15   [name] D Clegg, NP  temazepam (RESTORIL) 30 MG capsule Take 30 mg by mouth at bedtime.    Historical Provider, MD  torsemide (DEMADEX) 100 MG tablet Take 1 tablet (100 mg total) by mouth 2 (two) times daily. 04/15/15   [name] D Filbert Schilder, NP  vitamin E 400 UNIT capsule Take 400 Units by mouth daily.    Historical Provider, MD  warfarin (COUMADIN) 5 MG tablet Take 1 tablet (5 mg total) by mouth daily. Take as directed by LVAD clinic 04/28/15   [name] D Clegg, NP   BP 93/60 mmHg  Pulse 40  Temp(Src) 98.1 F (36.7 C) (Oral)  Resp 24  Ht  (1.651 m)  Wt 268 lb (121.564 kg)  BMI 44.60 kg/m2  SpO2 98%  LMP 01/19/2014 Physical Exam  Constitutional: She is oriented to person, place, and time. She appears well-developed and well-nourished. No distress.  HENT:  Head: Normocephalic and atraumatic.  Eyes: Conjunctivae are normal. Pupils are equal, round, and reactive to light.  Neck: Neck supple. No tracheal deviation present. No thyromegaly present.  Cardiovascular: Normal rate and regular rhythm.   No murmur heard. Pulmonary/Chest: Effort normal and breath sounds normal.  Abdominal: Soft. Bowel sounds are normal. She exhibits no distension. There is no tenderness.  Morbidly obese  Musculoskeletal: Normal range of motion. She exhibits edema. She exhibits no tenderness.  Trace pretibial pitting edema bilaterally  Neurological:  She is alert and oriented to person, place, and time. No cranial nerve deficit. She exhibits normal muscle tone. Coordination normal.  Moves all extremities well   Skin: Skin is warm and dry. No rash noted.  Psychiatric: She has a normal mood and affect.  Nursing note and vitals reviewed.   ED Course  Procedures (including critical care time) Labs Review Labs Reviewed  BASIC METABOLIC PANEL  TROPONIN I  CBC    Imaging Review No results found. I have personally reviewed and evaluated these images and lab results as part of my medical decision-making.   EKG Interpretation   Date/Time:  Sunday May 16 2015 12:19:07 EDT Ventricular Rate:  65 PR Interval:  125 QRS Duration: 157 QT Interval:  476 QTC Calculation: 495 R Axis:   -167 Text Interpretation:  Atrial-ventricular dual-paced complexes No further  analysis attempted due to paced rhythm Baseline wander in lead(s) V1 V2 No  significant change since last tracing Confirmed by Rayman Petrosian  MD, Vetra Shinall  ([id number]) on 05/16/2015 12:23:24 PM     3:25 PM breathing normal after treatment with intravenous Lasix. Headache is improved after treatment with Tylenol. Patient in no distress Chest x-ray viewed by me Results for orders placed or performed during the hospital encounter of 05/16/15  Basic metabolic panel  Result Value Ref Range   Sodium 138 135 - 145 mmol/L   Potassium 4.1 3.5 - 5.1 mmol/L   Chloride 96 (L) 101 - 111 mmol/L   CO2 30 22 - 32 mmol/L   Glucose, Bld 112 (H) 65 - 99 mg/dL   BUN 48 (H) 6 - 20 mg/dL   Creatinine, Ser 1.61 (H) 0.44 - 1.00 mg/dL   Calcium 9.1 8.9 - 09.6 mg/dL   GFR calc non Af Amer 30 (L) >60 mL/min   GFR calc Af Amer 35 (L) >60 mL/min   Anion gap 12 5 - 15  Troponin I  Result Value Ref Range   Troponin I <0.03 <0.031 ng/mL  CBC  Result Value Ref Range   WBC 5.6 4.0 - 10.5 K/uL   RBC 4.33 3.87 - 5.11 MIL/uL   Hemoglobin 13.1 12.0 - 15.0 g/dL   HCT 04.5 40.9 - 81.1 %   MCV 94.2 78.0 - 100.0 fL   MCH 30.3 26.0 - 34.0 pg   MCHC 32.1 30.0 - 36.0 g/dL   RDW 91.4 78.2 - 95.6 %   Platelets 277 150 - 400 K/uL   Dg Chest 2 View  05/16/2015   CLINICAL  DATA:  Shortness of breath.  EXAM: CHEST  2 VIEW  COMPARISON:  04/15/2015.  FINDINGS: Stable enlarged cardiac silhouette and left subclavian pacer and AICD leads. Stable prominence of the pulmonary vasculature and interstitial markings with mild diffuse peribronchial thickening. Right PICC tip in the proximal superior vena cava. Thoracic spine degenerative changes. Cholecystectomy clips.  IMPRESSION: Stable cardiomegaly, pulmonary vascular congestion, chronic bronchitic changes and probable chronic interstitial lung disease without definite pulmonary edema.   Electronically Signed   By: Beckie Salts M.D.   On: 05/16/2015 13:05    MDM  Renal insufficiency is chronic  Final diagnoses:  None    Plan : keep scheduled appt with heart failure clinic in 2 days Dx #1 chf #2 chronic renal insuffieincy #3 non specific headache  Doug Sou, MD 05/16/15 1540

## 2015-05-16 NOTE — ED Notes (Addendum)
MD at bedside.pt reports decreased SOB and states that she is now at her baseline for breathing.

## 2015-05-16 NOTE — ED Notes (Signed)
Pt sent here by [facility] RN for increased sob.  States pt is on a milrinone drip and has had a 6 lb weight gain since last night, muffled heart sounds.  States was told by MD to increase toursemide and potassium last night.  Pt has had decreased urine output.  RN stated that o2 stats had been 98%,  But rr were increasing.

## 2015-05-16 NOTE — ED Notes (Signed)
MD at bedside. 

## 2015-05-18 ENCOUNTER — Telehealth (HOSPITAL_COMMUNITY): Payer: Self-pay | Admitting: Vascular Surgery

## 2015-05-18 ENCOUNTER — Inpatient Hospital Stay (HOSPITAL_COMMUNITY): Admission: RE | Admit: 2015-05-18 | Payer: Medicare Other | Source: Ambulatory Visit

## 2015-05-18 ENCOUNTER — Encounter (HOSPITAL_COMMUNITY): Payer: Medicare Other

## 2015-05-18 NOTE — Telephone Encounter (Signed)
appt resch for 9/9

## 2015-05-18 NOTE — Telephone Encounter (Signed)
Pt car broke down she would like to reschedule her appt but does not want to push it too far out. We dont have anything can you please call pt and resch her.. Please advise

## 2015-05-19 ENCOUNTER — Other Ambulatory Visit (HOSPITAL_COMMUNITY)
Admission: RE | Admit: 2015-05-19 | Discharge: 2015-05-19 | Disposition: A | Discharge status: Home / Self Care | Payer: Medicare Other | Source: Other Acute Inpatient Hospital | Attending: Cardiology | Admitting: Cardiology

## 2015-05-19 ENCOUNTER — Ambulatory Visit (HOSPITAL_COMMUNITY): Payer: Self-pay | Admitting: *Deleted

## 2015-05-19 ENCOUNTER — Telehealth (HOSPITAL_COMMUNITY): Payer: Self-pay | Admitting: *Deleted

## 2015-05-19 DIAGNOSIS — Z5181 Encounter for therapeutic drug level monitoring: Secondary | ICD-10-CM | POA: Diagnosis present

## 2015-05-19 DIAGNOSIS — Z7901 Long term (current) use of anticoagulants: Secondary | ICD-10-CM

## 2015-05-19 LAB — CBC WITH DIFFERENTIAL/PLATELET
BASOS ABS: 0 10*3/uL (ref 0.0–0.1)
BASOS PCT: 0 % (ref 0–1)
EOS ABS: 0.2 10*3/uL (ref 0.0–0.7)
Eosinophils Relative: 5 % (ref 0–5)
HEMATOCRIT: 39.5 % (ref 36.0–46.0)
HEMOGLOBIN: 13.1 g/dL (ref 12.0–15.0)
Lymphocytes Relative: 20 % (ref 12–46)
Lymphs Abs: 1.1 10*3/uL (ref 0.7–4.0)
MCH: 31.7 pg (ref 26.0–34.0)
MCHC: 33.2 g/dL (ref 30.0–36.0)
MCV: 95.6 fL (ref 78.0–100.0)
MONOS PCT: 5 % (ref 3–12)
Monocytes Absolute: 0.3 10*3/uL (ref 0.1–1.0)
NEUTROS ABS: 3.7 10*3/uL (ref 1.7–7.7)
NEUTROS PCT: 70 % (ref 43–77)
Platelets: 280 10*3/uL (ref 150–400)
RBC: 4.13 MIL/uL (ref 3.87–5.11)
RDW: 15.6 % — ABNORMAL HIGH (ref 11.5–15.5)
WBC: 5.4 10*3/uL (ref 4.0–10.5)

## 2015-05-19 LAB — BASIC METABOLIC PANEL
ANION GAP: 9 (ref 5–15)
BUN: 57 mg/dL — ABNORMAL HIGH (ref 6–20)
CALCIUM: 8.9 mg/dL (ref 8.9–10.3)
CO2: 34 mmol/L — ABNORMAL HIGH (ref 22–32)
CREATININE: 1.9 mg/dL — AB (ref 0.44–1.00)
Chloride: 93 mmol/L — ABNORMAL LOW (ref 101–111)
GFR, EST AFRICAN AMERICAN: 35 mL/min — AB (ref >60)
GFR, EST NON AFRICAN AMERICAN: 30 mL/min — AB (ref >60)
Glucose, Bld: 136 mg/dL — ABNORMAL HIGH (ref 65–99)
Potassium: 3.6 mmol/L (ref 3.5–5.1)
SODIUM: 136 mmol/L (ref 135–145)

## 2015-05-19 LAB — PROTIME-INR
INR: 5.05 — AB (ref 0.00–1.49)
PROTHROMBIN TIME: 45.3 s — AB (ref 11.6–15.2)

## 2015-05-19 LAB — MAGNESIUM: MAGNESIUM: 2 mg/dL (ref 1.7–2.4)

## 2015-05-19 MED ORDER — METOLAZONE 2.5 MG PO TABS: 2.5000 mg | ORAL_TABLET | ORAL | Status: DC

## 2015-05-19 MED ORDER — POTASSIUM CHLORIDE CRYS ER 20 MEQ PO TBCR: EXTENDED_RELEASE_TABLET | ORAL | Status: DC

## 2015-05-19 NOTE — Telephone Encounter (Signed)
Clarise Cruz, RN called concerned about pt's wt gain, pt's wt was up some and pt was more SOB on Sat and called the on-call MD, she was advised to take an extra 100 mg of Torsemide which she did.  Current meds include: Torsemide 100 mg Twice daily, Met 2.5 on Mon and Thur and Spiro 25 mg Twice daily, pt has been out of Arlyce Harman since [place] evening.  On Sunday pt still did not feel any better and went to ER where she was given 80 mg IV Lasix however since that time her weight has only continued to rise 5 lbs.  Weights: Sun 265 Mon 267 Tue 269.5 Today 270   Clarise Cruz reports pt is more SOB with exertion, she was unable to lay flat last night and had to sleep sitting up, her abd girth has increased as well as ankle measurements as pt has edema in ankles and feet.  Pt was schedule to see [place] in clinic yesterday but had to cx appt b/c her car broke down and she had no transportation.  Will send to Dr Aundra Dubin for review and call Clarise Cruz back.

## 2015-05-19 NOTE — Telephone Encounter (Signed)
Get spironolactone.  Take metolazone 3 days in a row starting today then go on a Monday/Thursday/Saturday schedule for metolazone.  Make sure she is taking K as ordered.  Continue torsemide 100 mg bid.  Work her in to see me on Friday morning (or today if she wants to come today).

## 2015-05-19 NOTE — Telephone Encounter (Signed)
Spoke w/Sara she is aware of instructions and will fill pt's pill box accordingly and explain all to pt, pt is already sch to see [place] on Fri, she can not come today due to transportation, Huntley Dec will see her again tomorrow and call us back if needed.  Huntley Dec also did pt's labs today

## 2015-05-20 ENCOUNTER — Telehealth (HOSPITAL_COMMUNITY): Payer: Self-pay

## 2015-05-20 ENCOUNTER — Telehealth (HOSPITAL_COMMUNITY): Payer: Self-pay | Admitting: *Deleted

## 2015-05-20 NOTE — Telephone Encounter (Signed)
Error

## 2015-05-20 NOTE — Telephone Encounter (Signed)
Huntley Dec, RN called to report pt is doing better today, her wt is down to 267 lb and pt states she is in the green zone, SOB is improved as well as the edema, she states pt will be able to make appt tomorrow

## 2015-05-20 NOTE — Telephone Encounter (Signed)
INR 5.05, per [name] Clegg NP-C, instructed patient to stop coumadin completely.  Patient aware and agreeable.  Ave Filter

## 2015-05-21 ENCOUNTER — Ambulatory Visit (HOSPITAL_COMMUNITY)
Admission: RE | Admit: 2015-05-21 | Discharge: 2015-05-21 | Disposition: A | Discharge status: Home / Self Care | Payer: Medicare Other | Source: Ambulatory Visit | Attending: Cardiology | Admitting: Cardiology

## 2015-05-21 ENCOUNTER — Encounter (HOSPITAL_COMMUNITY): Payer: Self-pay

## 2015-05-21 VITALS — BP 98/62 | HR 80 | Wt 273.2 lb

## 2015-05-21 DIAGNOSIS — I5022 Chronic systolic (congestive) heart failure: Secondary | ICD-10-CM | POA: Insufficient documentation

## 2015-05-21 DIAGNOSIS — Z79899 Other long term (current) drug therapy: Secondary | ICD-10-CM | POA: Diagnosis not present

## 2015-05-21 DIAGNOSIS — I272 Other secondary pulmonary hypertension: Secondary | ICD-10-CM | POA: Insufficient documentation

## 2015-05-21 DIAGNOSIS — I472 Ventricular tachycardia, unspecified: Secondary | ICD-10-CM

## 2015-05-21 DIAGNOSIS — R531 Weakness: Secondary | ICD-10-CM | POA: Diagnosis not present

## 2015-05-21 DIAGNOSIS — K219 Gastro-esophageal reflux disease without esophagitis: Secondary | ICD-10-CM | POA: Diagnosis not present

## 2015-05-21 DIAGNOSIS — I428 Other cardiomyopathies: Secondary | ICD-10-CM | POA: Diagnosis not present

## 2015-05-21 DIAGNOSIS — E1142 Type 2 diabetes mellitus with diabetic polyneuropathy: Secondary | ICD-10-CM | POA: Insufficient documentation

## 2015-05-21 DIAGNOSIS — I10 Essential (primary) hypertension: Secondary | ICD-10-CM | POA: Diagnosis not present

## 2015-05-21 DIAGNOSIS — G4733 Obstructive sleep apnea (adult) (pediatric): Secondary | ICD-10-CM | POA: Diagnosis not present

## 2015-05-21 DIAGNOSIS — D509 Iron deficiency anemia, unspecified: Secondary | ICD-10-CM | POA: Insufficient documentation

## 2015-05-21 DIAGNOSIS — Z95810 Presence of automatic (implantable) cardiac defibrillator: Secondary | ICD-10-CM | POA: Insufficient documentation

## 2015-05-21 DIAGNOSIS — J45909 Unspecified asthma, uncomplicated: Secondary | ICD-10-CM | POA: Insufficient documentation

## 2015-05-21 DIAGNOSIS — Z794 Long term (current) use of insulin: Secondary | ICD-10-CM | POA: Diagnosis not present

## 2015-05-21 LAB — HEPATIC FUNCTION PANEL
ALBUMIN: 3 g/dL — AB (ref 3.5–5.0)
ALT: 19 U/L (ref 14–54)
AST: 25 U/L (ref 15–41)
Alkaline Phosphatase: 74 U/L (ref 38–126)
BILIRUBIN TOTAL: 1.4 mg/dL — AB (ref 0.3–1.2)
Bilirubin, Direct: 0.3 mg/dL (ref 0.1–0.5)
Indirect Bilirubin: 1.1 mg/dL — ABNORMAL HIGH (ref 0.3–0.9)
TOTAL PROTEIN: 7 g/dL (ref 6.5–8.1)

## 2015-05-21 LAB — TSH: TSH: 4.814 u[IU]/mL — AB (ref 0.350–4.500)

## 2015-05-21 MED ORDER — METOLAZONE 2.5 MG PO TABS: 2.5000 mg | ORAL_TABLET | ORAL | Status: DC

## 2015-05-21 NOTE — Progress Notes (Signed)
Advanced Heart Failure Medication Review by a Pharmacist  Does the patient  feel that his/her medications are working for him/her?  yes  Has the patient been experiencing any side effects to the medications prescribed?  no  Does the patient measure his/her own blood pressure or blood glucose at home?  yes   Does the patient have any problems obtaining medications due to transportation or finances?   no  Understanding of regimen: good Understanding of indications: good Potential of compliance: good    Pharmacist comments:  Ms. Heinzmann is a pleasant 49 yo F presenting with a current medication list. She seems to have a good understanding of her regimen and reports good compliance. She states that her Corlanor and digoxin were discontinued about 3 weeks ago for a low heart rate and her warfarin was held on Wednesday for an elevated INR. She did not have any specific medication-related questions or concerns for me today.  Tyler Deis. Bonnye Fava, PharmD, BCPS, CPP Clinical Pharmacist Pager: [phone] Phone: [phone] 05/21/2015 10:38 AM

## 2015-05-21 NOTE — Patient Instructions (Signed)
Increase metolazone to one tab EVERY OTHER DAY. Take 1 potassium tablet with this.  Routine lab work today. Will notify you of abnormal results, otherwise no news is good news!  Follow up 2 weeks.  Do the following things EVERYDAY: 1) Weigh yourself in the morning before breakfast. Write it down and keep it in a log. 2) Take your medicines as prescribed 3) Eat low salt foods-Limit salt (sodium) to 2000 mg per day.  4) Stay as active as you can everyday 5) Limit all fluids for the day to less than 2 liters

## 2015-05-23 ENCOUNTER — Telehealth: Payer: Self-pay | Admitting: Physician Assistant

## 2015-05-23 NOTE — Progress Notes (Signed)
Patient ID: [name], female   DOB: 12-06-1965, 49 y.o.   MRN: [id number] EP: Dr Lovena Le PCP: Dr Karie Kirks PRIMARY HF MD: Dr Aundra Dubin   HPI: Ms. Kinkade is a 49 year old female with a history of NICM s/p BiV ICD 2007 ([place]), chronic systolic HF, DM2, HTN, recurrent VT, morbid obesity and OSA on CPAP.   Admitted to [facility] 12/08/13 with progressive DOE.and volume overload.  She was transferred from [facility] to [facility] due to acute decompensated HF. ECHO EF 35% by echo but difficult study.  Admitted 08/05/14 after ICD shock for V fib. Also diuresed with IV lasix and transitioned back to torsemide. Also with thyromegaly and had ultrasound with small cysts noted. Discharge weight was 249 pounds.  She had another ICD shock in 12/15 for VT.  Admitted 7/15 through 04/08/15 after VF/VT with multiple shocks and cardiogenic shock requiring milrinone. Had [place] with high pulmonary pressures and she was started on sildenafil. Discharged on milrinone 0.375 mcg. Met with LVAD coordinator and cardiac surgery for possible advanced therapies. Discharge weight 263 pounds.   Admitted 8/4 through 04/28/15 with volume overload. Diuresed with IV lasix and transitioned to torsemide 100 mg twice a day. She continued on milrinone 0.375 mcg. Evaluated by LVAD team. Discharge weight 262 pounds.   She returns for followup.  Weight is up again.  She is using home oxygen all the time now.  She has chronic 2 pillow orthopnea.  She has mild dyspnea walking around her house.  Weight has been up and down at home.  Since restarting on spironolactone (had been out) and increase metolazone to three times a week, weight has been coming down.  She has been on a trial of coumadin given history of GI bleeding to see if she could tolerate this medication in case we moved forward with LVAD. Most recent INR was 5, coumadin was held.  She has had no melena or BRBPR.  HR was lower recently and Corlanor and digoxin were stopped.   [place] 04/23/2015  RA =  19 RV = 68/14/19 PA = 73/34 (53) PCW = 33 (v = 50) Fick cardiac output/index = 5.8/2.6 Thermo cardiac output/index = 5.8/2.6 PVR =4.0 WU FA sat = 98% PA sat = 66%, 70% RA/PCWP = 0.58  RHC 03/31/15 RA = 28 RV = 83/19/30 PA = 86/38 (65) PCW = 36 Fick cardiac output/index = 2.9/1.3 Thermo cardiac output/index = 3.1/1.3 PVR = 10 Arterial sat = 99% PA sat = 45%, 48%  RHC 01/06/15 RA mean 18 RV 66/22 PA 66/34, mean 48 PCWP mean 28 Oxygen saturations: PA 55% AO 93% Cardiac Output (Fick) 3.77  Cardiac Index (Fick) 1.8 PVR 5.3 WU Cardiac Output (Thermo) 4.77 Cardiac Index (Thermo) 2.26 PVR 4.2 WU  [facility] 12/12/13  RA 38/37 (35)  RV 90/28  PA 95/51 (71)  PCWP 57/73 (54)  LV 127/50  AO 123/66 (100)  Oxygen saturations:  PA 40%  AO 96%  Cardiac Output/Index (Fick) 3.2/1.4, PVR 5.3  Normal coronary anatomy  CPX 6/15 FVC 1.34 (42%)  FEV1 1.11 (43%)  FEV1/FVC 83%  MVV 42 (40%) RPE: 19 Resting HR: 80 Peak HR: 115 (67% age predicted max HR) BP rest: 102/70 BP peak: 118/68 Peak VO2: 9.3 (52.9% predicted peak VO2) VE/VCO2 slope: 29.3 OUES: 1.57 Peak RER: 0.95 Ventilatory Threshold: 7.7 (43.8% predicted peak VO2) Peak RR 44 Peak Ventilation: 32 VE/MVV: 76.2% PETCO2 at peak: 41 O2pulse: 10 (77% predicted O2pulse)  - Echo 12/09/13 EF 35% echo  but difficult study.  - Echo (3/16) with EF 30-35%, severe LV dilation with mild LVH, restrictive diastolic function, moderate MR, mildly decreased RV systolic function, PA systolic pressure 60 mmHg. - ECHO 04/05/2015: EF 30-35%    ABIs (4/16) were normal.   Labs 11/28/13 Pro BNP 948  Labs 12/23/13 Dig level 0.6, K 4.6,  Creatinine 0.86 , Pro BNP 248 Labs 02/23/14 K 3.2 Cr 0.69 Labs 8/15 K 3.7, creatinine 0.78 Labs 12/15 K 4.5, creatinine 0.98, BNP 172, TSH normal Labs 3/16 TSH normal, LFTs normal, digoxin 0.9 Labs 4/16 K 3.7, creatinine 1.06, BNP 235, HCT 42.8 Laba 12/21/2014: K 4.8 Creatine 1.33 Labs 01/20/2015: K 4.8  Creatinine 1.33  Labs 01/26/15: K 4.2, creatinine 1.21, BNP 224, HCT 41.7 Labs 04/15/15: K 3.8 Creatinine 1.35 Hgb 12.7 Mag 1.9 Labs 04/28/2015 K 3.4 Creatinine 2.07  Labs 05/03/2015: INR 1.3  Labs 9/16: K 3.6, creatinine 1.9, HCT 39.5  ECG: NSR, BiV pacing  FH: Father alive with HF   SH: No ETOH and does not smoke, lives in [place] with significant other and 32 yr old daughter   ROS: All systems negative except as listed in HPI, PMH and Problem List.  Past Medical History  Diagnosis Date  . CHF (congestive heart failure)     a. EF 30-35%, RV mildly dilated (difficult study 11/2013) b. RHC (12/2013): RA 38/37 (35), RV 90/28, PA 95/51 (71), PCWP 54, PA 40%, AO 96 %, CO/CI Fick 3.2/1.4, PVR 5.3   . Nonischemic cardiomyopathy 12/2013    a. LHC (12/2013) normal coronary anatomy   . Mitral regurgitation     moderate to severe  . LBBB (left bundle branch block)   . HTN (hypertension)   . Asthma   . IBS (irritable bowel syndrome)     with primarily constipation  . Morbid obesity   . GERD (gastroesophageal reflux disease)   . IDA (iron deficiency anemia)     2o TO SB AVMS, Hx parenteral iron Dr Tressie Stalker  . AVM (arteriovenous malformation)     Small bowel, s/p duble balloon enteroscopy/APC jejunum Dr Arsenio Loader [facility]) 01/23/2011  . Peripheral neuropathy   . OSA (obstructive sleep apnea)     on CPAP qhs  . Diabetes mellitus without complication   . Torsades de pointes     a. appropriate ICD therapy 12/2014 in setting of hypokalemia  . Pneumonia     Current Outpatient Prescriptions  Medication Sig Dispense Refill  . albuterol (PROVENTIL HFA;VENTOLIN HFA) 108 (90 BASE) MCG/ACT inhaler Inhale 2 puffs into the lungs every 6 (six) hours as needed for wheezing or shortness of breath.    . allopurinol (ZYLOPRIM) 300 MG tablet Take 1 tablet (300 mg total) by mouth daily. 30 tablet 6  . amiodarone (PACERONE) 400 MG tablet Take 400 mg by mouth 2 (two) times daily.    . cyclobenzaprine (FLEXERIL)  10 MG tablet Take 10 mg by mouth 3 (three) times daily as needed for muscle spasms.     Marland Kitchen docusate sodium (COLACE) 100 MG capsule Take 1 capsule (100 mg total) by mouth 2 (two) times daily as needed for mild constipation. 60 capsule 6  . DULoxetine (CYMBALTA) 30 MG capsule Take 30 mg by mouth daily.    . ferrous sulfate 325 (65 FE) MG tablet Take 325 mg by mouth 3 (three) times daily.    Marland Kitchen gabapentin (NEURONTIN) 300 MG capsule Take 600 mg by mouth 4 (four) times daily.    . hydrALAZINE (APRESOLINE) 25 MG  tablet Take 0.5 tablets (12.5 mg total) by mouth every 8 (eight) hours. 90 tablet 6  . insulin aspart (NOVOLOG) 100 UNIT/ML injection Inject 0-20 Units into the skin 3 (three) times daily with meals. (Patient taking differently: Inject 0-20 Units into the skin 3 (three) times daily with meals. Per sliding scale: 120 - 150 = 2 units, 151 - 200 = 4 units, 201 - 250 = 7 units, 251 - 300 = 11 units, 301 - 350 = 15 units, 351 and above = 20 units) 1 vial 5  . insulin detemir (LEVEMIR) 100 UNIT/ML injection Inject 0.58 mLs (58 Units total) into the skin 2 (two) times daily. 10 mL 11  . Linaclotide (LINZESS) 290 MCG CAPS capsule Take 1 capsule (290 mcg total) by mouth daily. 30 minutes before breakfast 30 capsule 5  . magnesium oxide (MAG-OX) 400 (241.3 MG) MG tablet Take 1 tablet (400 mg total) by mouth 2 (two) times daily. 60 tablet 6  . metolazone (ZAROXOLYN) 2.5 MG tablet Take 1 tablet (2.5 mg total) by mouth every other day. 8 tablet 6  . milrinone (PRIMACOR) 20 MG/100ML SOLN infusion Inject 47.5125 mcg/min into the vein continuous. 100 mL 6  . omeprazole (PRILOSEC) 40 MG capsule Take 1 capsule (40 mg total) by mouth daily. 30 capsule 6  . oxyCODONE-acetaminophen (PERCOCET) 10-325 MG per tablet Take 0.5-1 tablets by mouth every 4 (four) hours as needed for pain.     . potassium chloride SA (K-DUR,KLOR-CON) 20 MEQ tablet Take 4 tabs in AM and 3 tabs in PM, Take an extra tab when you take metolazone 270  tablet 6  . ranolazine (RANEXA) 500 MG 12 hr tablet Take 1 tablet (500 mg total) by mouth 2 (two) times daily. 60 tablet 6  . sildenafil (REVATIO) 20 MG tablet Take 2 tablets (40 mg total) by mouth 3 (three) times daily. 120 tablet 0  . spironolactone (ALDACTONE) 25 MG tablet TAKE 1 TABLET BY MOUTH TWICE DAILY. 60 tablet 3  . temazepam (RESTORIL) 30 MG capsule Take 30 mg by mouth at bedtime.    . torsemide (DEMADEX) 100 MG tablet Take 1 tablet (100 mg total) by mouth 2 (two) times daily. 60 tablet 6  . vitamin E 400 UNIT capsule Take 400 Units by mouth daily.     No current facility-administered medications for this encounter.    Filed Vitals:   05/21/15 1014  BP: 98/62  Pulse: 80  Weight: 273 lb 4 oz (123.945 kg)  SpO2: 97%     PHYSICAL EXAM: General: Obese woman. No resp difficulty. Boyfriend present.    HEENT: normal Neck: Thick. JVP 12.  Carotids 2+ bilaterally; no bruits. No lymphadenopathy or thryomegaly appreciated. Cor: PMI normal. Regular rate & rhythm. No rubs, 2/6 HSM LLSB.  Unable to palpate pedal pulses. Lungs: clear Abdomen: obese, soft, nontender, mildly distended. No hepatosplenomegaly. No bruits or masses. Good bowel sounds. Extremities: no cyanosis, clubbing, rash.  1+ edema 1/2 to knees bilaterally.  RUE PICC Neuro: alert & orientedx3, cranial nerves grossly intact. Moves all 4 extremities w/o difficulty. Affect pleasant.  ASSESSMENT & PLAN:  1. Chronic Systolic Heart Failure: Nonischemic cardiomyopathy, s/p [employer] CRT-D. EF 30-35% (03/2015). Now on Milrinone so CPX not applicable.  NYHA class IIIb symptoms on Milrinone 0.375 mcg.  Weight is up, and she appears volume overloaded.  She says weight at home has been falling since she restarted spironolactone and increased metolazone to three times a week.  - Continue milrinone 0.375 -  Continue torsemide 100 mg bid and spironolactone 25 mg bid.  - Increase metolazone to every other day.  - BMET/BNP in 2  wks.  - She will stay off digoxin and corlanor for now (low HR). - She has been on coumadin to see if she can tolerate it in case we decided on LVAD (h/o GI bleeding).  Most recent INR was 5.  She has been on coumadin for about a month with no bleeding sequelae.  She can stop it and start ASA 81 daily.  - I suspect that she is not going to be a good VAD candidate.  Lack of social support is a major issue.  Additionally, obesity plays a major role in her symptoms.  2. OSA, suspected OHS: Using CPAP at night and oxygen during the day.   3. Morbid Obesity: Needs to lose weight. Discussed portions control.   4. VT/VF 03/26/2015  with ICD resuscitation: Amiodarone to continue at 400 mg bid x 3 months per Dr Lovena Le.  Needs TSH/LFTs.  She will need regular eye exams while on anticoagulation.  5. Leg weakness: Difficult to palpate pedal pulses but ABIs normal in 4/16.  6. H/O GI bleed with AVM. - no evidence of bleeding.  7. Pulmonary HTN: RHC 05/01/15 PVR 4.0, improved. Continue current dose of sildenafil.    Follow up in 2 weeks  Loralie Champagne   05/23/2015

## 2015-05-23 NOTE — Telephone Encounter (Signed)
    I received a phone call from [name] with [facility]. She states that [name] is having weight gain, worsening SOB and fatigue despite increased metolazone 2.5mg  to every other day by Dr. [name] on Friday (05/23/15). She also takes 100mg  torsemide BID and is on home milrinone. I spoke with Dr. [name] over the phone who advised 80mg  IV Lasix x1 push today and tomorrow. Home care RN will get labs tomorrow.   Cline Crock PA-C  [facility]

## 2015-05-24 ENCOUNTER — Other Ambulatory Visit (HOSPITAL_COMMUNITY)
Admission: RE | Admit: 2015-05-24 | Discharge: 2015-05-24 | Disposition: A | Discharge status: Home / Self Care | Payer: Medicare Other | Source: Other Acute Inpatient Hospital | Attending: Cardiology | Admitting: Cardiology

## 2015-05-24 ENCOUNTER — Telehealth (HOSPITAL_COMMUNITY): Payer: Self-pay | Admitting: Cardiology

## 2015-05-24 ENCOUNTER — Other Ambulatory Visit: Payer: Self-pay | Admitting: *Deleted

## 2015-05-24 DIAGNOSIS — J982 Interstitial emphysema: Secondary | ICD-10-CM | POA: Insufficient documentation

## 2015-05-24 LAB — BASIC METABOLIC PANEL
ANION GAP: 10 (ref 5–15)
BUN: 71 mg/dL — ABNORMAL HIGH (ref 6–20)
CHLORIDE: 91 mmol/L — AB (ref 101–111)
CO2: 32 mmol/L (ref 22–32)
CREATININE: 2.15 mg/dL — AB (ref 0.44–1.00)
Calcium: 8.6 mg/dL — ABNORMAL LOW (ref 8.9–10.3)
GFR calc non Af Amer: 26 mL/min — ABNORMAL LOW (ref >60)
GFR, EST AFRICAN AMERICAN: 30 mL/min — AB (ref >60)
Glucose, Bld: 150 mg/dL — ABNORMAL HIGH (ref 65–99)
Potassium: 3.7 mmol/L (ref 3.5–5.1)
SODIUM: 133 mmol/L — AB (ref 135–145)

## 2015-05-24 NOTE — Telephone Encounter (Signed)
Maralyn Sago, Rn with [facility] called during patients [facility] visit Patients weight has increased despite IV lasix x 1 that was ordered over the weekend. pts weight Friday 269, weight today 273 Pt does c/o increased SOB, dizziness, fine crackles in R lower lobe 102/69-automated cuff 96% on 2L o2 ~2000CC UOP Pt was having difficult time with pump overnight, alarms and possible periods were pump was not infusing  As ordered by Dr.McLean Pt should received second dose of 80 mg IV Lasix today Labs (Bmet,BNP) Ok for pt to receive Metolazone as this was increased (every other day) at last office visit 05/21/15 RN to return to patients home for recheck on 05/25/15 Infusion pump was restarted and second lumen flushed/ perfect blood return. milrinone infusion just fine while Maralyn Sago was in home

## 2015-05-25 ENCOUNTER — Other Ambulatory Visit (HOSPITAL_COMMUNITY)
Admission: RE | Admit: 2015-05-25 | Discharge: 2015-05-25 | Disposition: A | Discharge status: Home / Self Care | Payer: Medicare Other | Source: Other Acute Inpatient Hospital | Attending: Cardiology | Admitting: Cardiology

## 2015-05-25 DIAGNOSIS — Z5181 Encounter for therapeutic drug level monitoring: Secondary | ICD-10-CM | POA: Insufficient documentation

## 2015-05-25 LAB — BRAIN NATRIURETIC PEPTIDE: B NATRIURETIC PEPTIDE 5: 432 pg/mL — AB (ref 0.0–100.0)

## 2015-05-26 ENCOUNTER — Other Ambulatory Visit: Payer: Self-pay

## 2015-05-26 ENCOUNTER — Emergency Department (HOSPITAL_COMMUNITY)
Admission: EM | Admit: 2015-05-26 | Discharge: 2015-05-26 | Disposition: A | Discharge status: Home / Self Care | Payer: Medicare Other | Attending: Emergency Medicine | Admitting: Emergency Medicine

## 2015-05-26 ENCOUNTER — Other Ambulatory Visit (HOSPITAL_COMMUNITY)
Admission: RE | Admit: 2015-05-26 | Discharge: 2015-05-26 | Disposition: A | Discharge status: Home / Self Care | Payer: Medicare Other | Source: Other Acute Inpatient Hospital | Attending: Cardiology | Admitting: Cardiology

## 2015-05-26 ENCOUNTER — Encounter (HOSPITAL_COMMUNITY): Payer: Self-pay | Admitting: Emergency Medicine

## 2015-05-26 ENCOUNTER — Telehealth (HOSPITAL_COMMUNITY): Payer: Self-pay | Admitting: Cardiology

## 2015-05-26 ENCOUNTER — Emergency Department (HOSPITAL_COMMUNITY): Payer: Medicare Other

## 2015-05-26 ENCOUNTER — Telehealth (HOSPITAL_COMMUNITY): Payer: Self-pay | Admitting: *Deleted

## 2015-05-26 DIAGNOSIS — Z8701 Personal history of pneumonia (recurrent): Secondary | ICD-10-CM | POA: Diagnosis not present

## 2015-05-26 DIAGNOSIS — Z9981 Dependence on supplemental oxygen: Secondary | ICD-10-CM | POA: Diagnosis not present

## 2015-05-26 DIAGNOSIS — G4733 Obstructive sleep apnea (adult) (pediatric): Secondary | ICD-10-CM | POA: Insufficient documentation

## 2015-05-26 DIAGNOSIS — J45901 Unspecified asthma with (acute) exacerbation: Secondary | ICD-10-CM | POA: Insufficient documentation

## 2015-05-26 DIAGNOSIS — I509 Heart failure, unspecified: Secondary | ICD-10-CM | POA: Diagnosis not present

## 2015-05-26 DIAGNOSIS — Z794 Long term (current) use of insulin: Secondary | ICD-10-CM | POA: Diagnosis not present

## 2015-05-26 DIAGNOSIS — K219 Gastro-esophageal reflux disease without esophagitis: Secondary | ICD-10-CM | POA: Insufficient documentation

## 2015-05-26 DIAGNOSIS — R0600 Dyspnea, unspecified: Secondary | ICD-10-CM

## 2015-05-26 DIAGNOSIS — Z79899 Other long term (current) drug therapy: Secondary | ICD-10-CM | POA: Diagnosis not present

## 2015-05-26 DIAGNOSIS — Z862 Personal history of diseases of the blood and blood-forming organs and certain disorders involving the immune mechanism: Secondary | ICD-10-CM | POA: Insufficient documentation

## 2015-05-26 DIAGNOSIS — I10 Essential (primary) hypertension: Secondary | ICD-10-CM | POA: Diagnosis not present

## 2015-05-26 DIAGNOSIS — E119 Type 2 diabetes mellitus without complications: Secondary | ICD-10-CM | POA: Insufficient documentation

## 2015-05-26 DIAGNOSIS — Z87891 Personal history of nicotine dependence: Secondary | ICD-10-CM | POA: Diagnosis not present

## 2015-05-26 DIAGNOSIS — R0602 Shortness of breath: Secondary | ICD-10-CM | POA: Diagnosis present

## 2015-05-26 LAB — BASIC METABOLIC PANEL
Anion gap: 11 (ref 5–15)
BUN: 71 mg/dL — AB (ref 6–20)
CO2: 34 mmol/L — ABNORMAL HIGH (ref 22–32)
Calcium: 8.5 mg/dL — ABNORMAL LOW (ref 8.9–10.3)
Chloride: 86 mmol/L — ABNORMAL LOW (ref 101–111)
Creatinine, Ser: 2.4 mg/dL — ABNORMAL HIGH (ref 0.44–1.00)
GFR calc Af Amer: 26 mL/min — ABNORMAL LOW (ref >60)
GFR, EST NON AFRICAN AMERICAN: 23 mL/min — AB (ref >60)
Glucose, Bld: 147 mg/dL — ABNORMAL HIGH (ref 65–99)
POTASSIUM: 3.4 mmol/L — AB (ref 3.5–5.1)
SODIUM: 131 mmol/L — AB (ref 135–145)

## 2015-05-26 LAB — CBC WITH DIFFERENTIAL/PLATELET
BASOS ABS: 0 10*3/uL (ref 0.0–0.1)
BASOS ABS: 0 10*3/uL (ref 0.0–0.1)
BASOS PCT: 0 %
BASOS PCT: 0 %
EOS ABS: 0.1 10*3/uL (ref 0.0–0.7)
EOS ABS: 0.1 10*3/uL (ref 0.0–0.7)
EOS PCT: 1 %
EOS PCT: 2 %
HCT: 37 % (ref 36.0–46.0)
HCT: 37.6 % (ref 36.0–46.0)
HEMOGLOBIN: 12.2 g/dL (ref 12.0–15.0)
Hemoglobin: 12 g/dL (ref 12.0–15.0)
LYMPHS ABS: 0.8 10*3/uL (ref 0.7–4.0)
LYMPHS PCT: 19 %
Lymphocytes Relative: 15 %
Lymphs Abs: 1.2 10*3/uL (ref 0.7–4.0)
MCH: 31.1 pg (ref 26.0–34.0)
MCH: 30.5 pg (ref 26.0–34.0)
MCHC: 32.4 g/dL (ref 30.0–36.0)
MCHC: 32.4 g/dL (ref 30.0–36.0)
MCV: 95.9 fL (ref 78.0–100.0)
MCV: 94 fL (ref 78.0–100.0)
Monocytes Absolute: 0.4 10*3/uL (ref 0.1–1.0)
Monocytes Absolute: 0.4 10*3/uL (ref 0.1–1.0)
Monocytes Relative: 5 %
Monocytes Relative: 7 %
NEUTROS PCT: 76 %
Neutro Abs: 4.9 10*3/uL (ref 1.7–7.7)
Neutro Abs: 4.4 10*3/uL (ref 1.7–7.7)
Neutrophils Relative %: 75 %
PLATELETS: 274 10*3/uL (ref 150–400)
PLATELETS: 256 10*3/uL (ref 150–400)
RBC: 3.86 MIL/uL — AB (ref 3.87–5.11)
RBC: 4 MIL/uL (ref 3.87–5.11)
RDW: 16.3 % — ABNORMAL HIGH (ref 11.5–15.5)
RDW: 16.4 % — ABNORMAL HIGH (ref 11.5–15.5)
WBC: 6.6 10*3/uL (ref 4.0–10.5)
WBC: 5.7 10*3/uL (ref 4.0–10.5)

## 2015-05-26 LAB — I-STAT TROPONIN, ED: TROPONIN I, POC: 0.02 ng/mL (ref 0.00–0.08)

## 2015-05-26 LAB — COMPREHENSIVE METABOLIC PANEL
ALBUMIN: 3.5 g/dL (ref 3.5–5.0)
ALT: 34 U/L (ref 14–54)
ANION GAP: 14 (ref 5–15)
AST: 48 U/L — AB (ref 15–41)
Alkaline Phosphatase: 101 U/L (ref 38–126)
BILIRUBIN TOTAL: 1.2 mg/dL (ref 0.3–1.2)
BUN: 66 mg/dL — AB (ref 6–20)
CHLORIDE: 89 mmol/L — AB (ref 101–111)
CO2: 30 mmol/L (ref 22–32)
Calcium: 8.7 mg/dL — ABNORMAL LOW (ref 8.9–10.3)
Creatinine, Ser: 2.36 mg/dL — ABNORMAL HIGH (ref 0.44–1.00)
GFR calc Af Amer: 27 mL/min — ABNORMAL LOW (ref >60)
GFR calc non Af Amer: 23 mL/min — ABNORMAL LOW (ref >60)
GLUCOSE: 105 mg/dL — AB (ref 65–99)
POTASSIUM: 3.6 mmol/L (ref 3.5–5.1)
SODIUM: 133 mmol/L — AB (ref 135–145)
TOTAL PROTEIN: 8 g/dL (ref 6.5–8.1)

## 2015-05-26 LAB — MAGNESIUM: MAGNESIUM: 2 mg/dL (ref 1.7–2.4)

## 2015-05-26 LAB — BRAIN NATRIURETIC PEPTIDE: B Natriuretic Peptide: 401.3 pg/mL — ABNORMAL HIGH (ref 0.0–100.0)

## 2015-05-26 NOTE — Telephone Encounter (Signed)
[facility] Dec called to report pt's weights:  Mon 272  Pt was given IV Lasix Tue 270 lb Wed 271.5, pt is taking Metolazone today as scheduled  LAST WED 9/7, weight was 267 lb and 8/17 at hosp d/c wt was 262 lb  Pt is currently taking all meds as prescribed including: Metolazone 2.5 mg every other day  Torsemide 100 mg Twice daily  Spiro 25 mg Twice daily   [facility]ley Dec reports pt's breathing is stable, no increased SOB and pt only has slight edema in her feet and ankles.  She is drawing weekly labs today and will see pt tomorrow to continue to monitor weight  Will send to Dr [name] for [employer]

## 2015-05-26 NOTE — ED Provider Notes (Signed)
CSN: [id number]     Arrival date & time 05/26/15  1943 History   First MD Initiated Contact with Patient 05/26/15 1954     Chief Complaint  Patient presents with  . Shortness of Breath    The patient was at a friend's house and walked outside and began to feel SOB.  She also said she started feeling dizzy but denies any chest painl.   The patient does have Pulmonary HTN and has milranone on a continuous drip,.     (Consider location/radiation/quality/duration/timing/severity/associated sxs/prior Treatment) HPI..... Complex patient with multiple health problems including congestive heart failure, cardiomyopathy, obesity, defibrillator, pulmonary hypertension presents with dyspnea and dizziness after walking outside today. No chest pain.  She is on a milranone drip at home on a perpetual basis. She feels better in the emergency department. Severity is moderate. Nothing makes symptoms better or worse.  Past Medical History  Diagnosis Date  . CHF (congestive heart failure)     a. EF 30-35%, RV mildly dilated (difficult study 11/2013) b. RHC (12/2013): RA 38/37 (35), RV 90/28, PA 95/51 (71), PCWP 54, PA 40%, AO 96 %, CO/CI Fick 3.2/1.4, PVR 5.3   . Nonischemic cardiomyopathy 12/2013    a. LHC (12/2013) normal coronary anatomy   . Mitral regurgitation     moderate to severe  . LBBB (left bundle branch block)   . HTN (hypertension)   . Asthma   . IBS (irritable bowel syndrome)     with primarily constipation  . Morbid obesity   . GERD (gastroesophageal reflux disease)   . IDA (iron deficiency anemia)     2o TO SB AVMS, Hx parenteral iron Dr Mariel Sleet  . AVM (arteriovenous malformation)     Small bowel, s/p duble balloon enteroscopy/APC jejunum Dr Gwinda Passe [facility]) 01/23/2011  . Peripheral neuropathy   . OSA (obstructive sleep apnea)     on CPAP qhs  . Diabetes mellitus without complication   . Torsades de pointes     a. appropriate ICD therapy 12/2014 in setting of hypokalemia  . Pneumonia     Past Surgical History  Procedure Laterality Date  . Appendectomy    . Cholecystectomy      biliary dyskinesia  . Mastectomy  2003    left, partial  . Knee arthroscopy  2005    left  . Small bowel enteroscopy  MAY 2012 [facility] DR. GILLIAM    SB AVMS s/p APC  . Colonoscopy  OCT 2010/SEP 2011    tortuous colon, 3 polyps-benign(2010),   . Upper gastrointestinal endoscopy  '08, '10, SEP 2011    mild antral gastritis (2010), negative SB bx (2010), incomlpete Schatzki's ring (2011)  . Small bowel enteroscopy  SEP 2011 [facility]    Fields-NO AVMS  . Givens capsule study  NOV 2010     NO AVMS, normal  . Irrigation and debridement abscess  06/25/2012    Procedure: MINOR INCISION AND DRAINAGE OF ABSCESS;  Surgeon: [name], MD;  Location: [facility] ORS;  Service: General;  Laterality: [name];  Incision & Drainage of Infected Sebaceous Cyst on Chest  . Breast surgery    . Implantable cardioverter defibrillator generator change Left 02/15/2012    BSX CRTD   . Left and right heart catheterization with coronary/graft angiogram  12/12/2013    Procedure: LEFT AND RIGHT HEART CATHETERIZATION WITH Isabel Caprice;  Surgeon: Peter M Swaziland, MD;  Location: [facility] CATH LAB;  Service: Cardiovascular;;  . Right heart catheterization [name] 01/06/2015  Procedure: RIGHT HEART CATH;  Surgeon: Laurey Morale, MD;  Location: [facility] CATH LAB;  Service: Cardiovascular;  Laterality: [name];  . Esophagogastroduodenoscopy  Sept 2013    [facility]: diminutive duodenal polyp, polyp in gastric cardia, path with benign duodenal mucosa with focally prominent Brunner's glands. Fundic gland polyps.   . Cardiac catheterization [name] 03/31/2015    Procedure: Right Heart Cath;  Surgeon: Dolores Patty, MD;  Location: [facility] INVASIVE CV LAB;  Service: Cardiovascular;  Laterality: [name];  . Cardiac catheterization [name] 04/23/2015    Procedure: Right Heart Cath;  Surgeon: Dolores Patty, MD;  Location: [facility] INVASIVE CV LAB;  Service:  Cardiovascular;  Laterality: [name];   Family History  Problem Relation Age of Onset  . Colon cancer Paternal Uncle   . Cervical cancer Mother   . Hypertension Mother   . Cancer Mother   . Heart disease Father   . Hypertension Father   . GER disease Father   . Heart attack Father   . Bleeding Disorder Father   . Diabetes Sister   . Heart disease Sister   . Hypertension Sister    Social History  Substance Use Topics  . Smoking status: Former Smoker -- 0.00 packs/day for 0 years    Types: Cigarettes  . Smokeless tobacco: Former Neurosurgeon     Comment: Quit x 4 years  . Alcohol Use: No   OB History    Gravida Para Term Preterm AB TAB SAB Ectopic Multiple Living   6 1 1  5  3 2        Review of Systems  All other systems reviewed and are negative.     Allergies  Cymbalta; Trazodone and nefazodone; and Lyrica  Home Medications   Prior to Admission medications   Medication Sig Start Date End Date Taking? Authorizing Provider  albuterol (PROVENTIL HFA;VENTOLIN HFA) 108 (90 BASE) MCG/ACT inhaler Inhale 2 puffs into the lungs every 6 (six) hours as needed for wheezing or shortness of breath.   Yes Historical Provider, MD  allopurinol (ZYLOPRIM) 300 MG tablet Take 1 tablet (300 mg total) by mouth daily. 08/14/14  Yes [name] D Clegg, NP  amiodarone (PACERONE) 200 MG tablet Take 200 mg by mouth 2 (two) times daily. 04/08/15  Yes Historical Provider, MD  cyclobenzaprine (FLEXERIL) 10 MG tablet Take 10 mg by mouth 3 (three) times daily as needed for muscle spasms.    Yes Historical Provider, MD  docusate sodium (COLACE) 100 MG capsule Take 1 capsule (100 mg total) by mouth 2 (two) times daily as needed for mild constipation. Patient taking differently: Take 100 mg by mouth 2 (two) times daily.  04/28/15  Yes [name] D Clegg, NP  DULoxetine (CYMBALTA) 30 MG capsule Take 30 mg by mouth daily. 02/24/15  Yes Historical Provider, MD  ferrous sulfate 325 (65 FE) MG tablet Take 325 mg by mouth 3 (three)  times daily.   Yes Historical Provider, MD  gabapentin (NEURONTIN) 300 MG capsule Take 600 mg by mouth 4 (four) times daily.   Yes Historical Provider, MD  hydrALAZINE (APRESOLINE) 25 MG tablet Take 0.5 tablets (12.5 mg total) by mouth every 8 (eight) hours. 04/28/15  Yes [name] D Clegg, NP  insulin aspart (NOVOLOG) 100 UNIT/ML injection Inject 0-20 Units into the skin 3 (three) times daily with meals. Patient taking differently: Inject 0-20 Units into the skin 3 (three) times daily with meals. Per sliding scale: 120 - 150 = 2 units, 151 - 200 = 4 units, 201 -  250 = 7 units, 251 - 300 = 11 units, 301 - 350 = 15 units, 351 and above = 20 units 06/28/12  Yes Gareth Morgan, MD  insulin detemir (LEVEMIR) 100 UNIT/ML injection Inject 0.58 mLs (58 Units total) into the skin 2 (two) times daily. 04/08/15  Yes [name] D Clegg, NP  Linaclotide (LINZESS) 290 MCG CAPS capsule Take 1 capsule (290 mcg total) by mouth daily. 30 minutes before breakfast 02/12/15  Yes Nira Retort, NP  magnesium oxide (MAG-OX) 400 (241.3 MG) MG tablet Take 1 tablet (400 mg total) by mouth 2 (two) times daily. 04/15/15  Yes [name] D Clegg, NP  metolazone (ZAROXOLYN) 2.5 MG tablet Take 1 tablet (2.5 mg total) by mouth every other day. 05/21/15  Yes Laurey Morale, MD  milrinone [facility]) 20 MG/100ML SOLN infusion Inject 47.5125 mcg/min into the vein continuous. 04/28/15  Yes [name] D Clegg, NP  omeprazole (PRILOSEC) 40 MG capsule Take 1 capsule (40 mg total) by mouth daily. 03/27/13  Yes Marinus Maw, MD  oxyCODONE-acetaminophen (PERCOCET) 10-325 MG per tablet Take 0.5-1 tablets by mouth every 4 (four) hours as needed for pain.    Yes Historical Provider, MD  potassium chloride SA (K-DUR,KLOR-CON) 20 MEQ tablet Take 4 tabs in AM and 3 tabs in PM, Take an extra tab when you take metolazone Patient taking differently: Take 60-80 mEq by mouth 2 (two) times daily. Take 4 tabs in AM and 3 tabs in PM, Take an extra tab when you take metolazone 05/19/15  Yes Laurey Morale, MD  ranolazine (RANEXA) 500 MG 12 hr tablet Take 1 tablet (500 mg total) by mouth 2 (two) times daily. 04/08/15  Yes [name] D Clegg, NP  sildenafil (REVATIO) 20 MG tablet Take 2 tablets (40 mg total) by mouth 3 (three) times daily. 04/28/15  Yes [name] D Clegg, NP  spironolactone (ALDACTONE) 25 MG tablet TAKE 1 TABLET BY MOUTH TWICE DAILY. 05/14/15  Yes [name] D Clegg, NP  temazepam (RESTORIL) 30 MG capsule Take 30 mg by mouth at bedtime.   Yes Historical Provider, MD  torsemide (DEMADEX) 100 MG tablet Take 1 tablet (100 mg total) by mouth 2 (two) times daily. 04/15/15  Yes [name] D Clegg, NP  vitamin E 400 UNIT capsule Take 400 Units by mouth daily.   Yes Historical Provider, MD   BP 112/74 mmHg  Pulse 71  Temp(Src) 98.1 F (36.7 C) (Oral)  Resp 18  Ht 5\' 5"  (1.651 m)  Wt 270 lb (122.471 kg)  BMI 44.93 kg/m2  SpO2 97%  LMP 01/19/2014 Physical Exam  Constitutional: She is oriented to person, place, and time.  Obese, no respiratory distress  HENT:  Head: Normocephalic and atraumatic.  Eyes: Conjunctivae and EOM are normal. Pupils are equal, round, and reactive to light.  Neck: Normal range of motion. Neck supple.  Cardiovascular: Normal rate and regular rhythm.   Pulmonary/Chest: Effort normal and breath sounds normal.  Abdominal: Soft. Bowel sounds are normal.  Musculoskeletal: Normal range of motion.  Neurological: She is alert and oriented to person, place, and time.  Skin: Skin is warm and dry.  Psychiatric: She has a normal mood and affect. Her behavior is normal.  Nursing note and vitals reviewed.   ED Course  Procedures (including critical care time) Labs Review Labs Reviewed  CBC WITH DIFFERENTIAL/PLATELET - Abnormal; Notable for the following:    RDW 16.4 (*)    All other components within normal limits  COMPREHENSIVE METABOLIC PANEL -  Abnormal; Notable for the following:    Sodium 133 (*)    Chloride 89 (*)    Glucose, Bld 105 (*)    BUN 66 (*)    Creatinine, Ser 2.36  (*)    Calcium 8.7 (*)    AST 48 (*)    GFR calc non Af Amer 23 (*)    GFR calc Af Amer 27 (*)    All other components within normal limits  BRAIN NATRIURETIC PEPTIDE - Abnormal; Notable for the following:    B Natriuretic Peptide 401.3 (*)    All other components within normal limits  Rosezena Sensor, ED    Imaging Review Dg Chest Port 1 View  05/26/2015   CLINICAL DATA:  Shortness of breath and syncope 1 day.  EXAM: PORTABLE CHEST - 1 VIEW  COMPARISON:  05/16/2015  FINDINGS: Right-sided PICC line has been pulled back slightly with tip overlying the region of the SVC near the junction of the subclavian vein. Left-sided pacemaker unchanged.  Lungs are hypoinflated with mild prominence of the perihilar markings suggesting mild vascular congestion. There is mild stable cardiomegaly. Remainder of the exam is unchanged.  IMPRESSION: Stable mild cardiomegaly with evidence of mild vascular congestion.  Right-sided PICC line has been pulled back slightly with tip over the region of the SVC near the junction with the subclavian vein.   Electronically Signed   By: Elberta Fortis M.D.   On: 05/26/2015 21:02   I have personally reviewed and evaluated these images and lab results as part of my medical decision-making.   EKG Interpretation None      Date: 05/26/2015  Rate: 78  Rhythm:  Paced rhythm  QRS Axis: wide  Intervals: normal  ST/T Wave abnormalities: normal  Conduction Disutrbances: none  Narrative Interpretation: unremarkable     MDM   Final diagnoses:  Dyspnea    Patient is hemodynamically stable. Vital signs are within normal limits. She feels much better in the ED.   Discharge home.    Donnetta Hutching, MD 05/26/15 [phone]

## 2015-05-26 NOTE — Discharge Instructions (Signed)
Rest. Follow-up your cardiologist or primary care doctor this week.

## 2015-05-26 NOTE — Telephone Encounter (Signed)
LATE ENTRY Heather,RN with [facility] called with pts update s/p IV Lasix UOP~5555mL HR 41 118/86  Weight today 270 (down 3 lbs) Decreased abdominal girth and LE edema No changes in SOB, mild orthopnea through the night- this is not new for the pt

## 2015-05-26 NOTE — ED Notes (Signed)
The patient was at a friend's house and walked outside and began to feel SOB.  She also said she started feeling dizzy but denies any chest painl.   The patient does have Pulmonary HTN and has milranone on a continuous drip.  The patient denies any other symptoms other than the SOB and dizziness.  She is on 2L/[place] at home.  EMS advised her Oxygen level on the 2L was 92%.  EMS did increase her oxygen to 4L and she increased to 97%.  She was transported her here to be evaluated.

## 2015-05-27 ENCOUNTER — Other Ambulatory Visit (HOSPITAL_COMMUNITY): Payer: Self-pay | Admitting: *Deleted

## 2015-05-27 MED ORDER — SILDENAFIL CITRATE 20 MG PO TABS: 40.0000 mg | ORAL_TABLET | Freq: Three times a day (TID) | ORAL | Status: AC

## 2015-05-27 MED ORDER — METOLAZONE 2.5 MG PO TABS: 2.5000 mg | ORAL_TABLET | ORAL | Status: AC

## 2015-05-28 ENCOUNTER — Other Ambulatory Visit (HOSPITAL_COMMUNITY): Payer: Self-pay | Admitting: Cardiology

## 2015-05-28 ENCOUNTER — Encounter: Payer: Self-pay | Admitting: *Deleted

## 2015-05-28 ENCOUNTER — Telehealth (HOSPITAL_COMMUNITY): Payer: Self-pay | Admitting: *Deleted

## 2015-05-28 ENCOUNTER — Encounter: Payer: Medicare Other | Attending: Internal Medicine | Admitting: *Deleted

## 2015-05-28 VITALS — Ht 65.0 in | Wt 273.1 lb

## 2015-05-28 DIAGNOSIS — Z713 Dietary counseling and surveillance: Secondary | ICD-10-CM | POA: Diagnosis not present

## 2015-05-28 DIAGNOSIS — Z794 Long term (current) use of insulin: Secondary | ICD-10-CM | POA: Diagnosis not present

## 2015-05-28 DIAGNOSIS — Z6841 Body Mass Index (BMI) 40.0 and over, adult: Secondary | ICD-10-CM | POA: Diagnosis not present

## 2015-05-28 DIAGNOSIS — E0822 Diabetes mellitus due to underlying condition with diabetic chronic kidney disease: Secondary | ICD-10-CM | POA: Diagnosis present

## 2015-05-28 MED ORDER — POTASSIUM CHLORIDE CRYS ER 20 MEQ PO TBCR
80.0000 meq | EXTENDED_RELEASE_TABLET | Freq: Two times a day (BID) | ORAL | Status: DC
Start: 2015-05-28 — End: 2015-06-10

## 2015-05-28 NOTE — Patient Instructions (Signed)
Plan:  Aim for 2 Carb Choices per meal (30 grams) +/- 1 either way  Aim for 0-1 Carbs per snack if hungry  Include lean protein in moderation with your meals and snacks Aim for 2000 mg Sodium per day (500 mg/meal, 100 per snack) Continue with your activity level by chair exercise daily as tolerated Continue checking BG at alternate times per day as directed by MD  Continue taking medication insulin as directed by MD

## 2015-05-28 NOTE — Telephone Encounter (Signed)
-----   Message from Laurey Morale, MD sent at 05/27/2015 11:35 AM EDT ----- Creatinine higher but volume overloaded.  Increase total daily KCl by 20 mEq and repeat BMET next week.

## 2015-05-28 NOTE — Telephone Encounter (Signed)
Notes Recorded by Noralee Space, RN on 05/28/2015 at 1:03 PM Pt aware and agreeable, she will increase to 4 tabs Twice daily, pt gets weekly labs through [facility]

## 2015-05-28 NOTE — Progress Notes (Signed)
  Medical Nutrition Therapy:  Appt start time: 0900 end time:  1030.  Assessment:  Primary concerns today: nutrition for CHF, Diabetes and weight loss. History of Diabetes for past 3 years and difficulty breathing most of her life. Her eating habits are irregular due to her schedule. States history of low BG about twice a month, usually at night. She SMBG 4 times a day, with reported range in AM of 101-150 mg/dl. States that is typical throughout the day. She states she does Chair exercises daily at home twice a day for about 5-10 minutes each time.   Preferred Learning Style:   No preference indicated   Learning Readiness:   Ready  Change in progress  MEDICATIONS: see list   DIETARY INTAKE:  Usual eating pattern includes 2-3 meals and 1-2 snacks per day.  Everyday foods include fair variety of all food groups.  Avoided foods include animal fats and high sodium foods.    24-hr recall:  B ( AM): 1 pkg grits, 1 egg, applesauce Snk ( AM): occasionally fresh or canned fruit  L ( PM): skips about once a week OR sandwich with [place], lettuce and tomato OR or rotisserie chicken salad Snk ( PM): not usually D ( PM): friend prepares her evening meal separately from other family members. Her meals are baked, no salt. Lean meat, vegetable, occasionally mashed potato Snk ( PM): 1 peanut butter cookie or 1 Nekot cracker Beverages: milk, OJ, water, unsweetened tea or diet soda  Usual physical activity: arm chair exercises daily for occupational and physical therapy twice a day increasing time and intensity as able  Estimated energy needs: 1200 calories 135 g carbohydrates 90 g protein 33 g fat  Progress Towards Goal(s):  In progress.   Nutritional Diagnosis:  NB-1.1 Food and nutrition-related knowledge deficit As related to sodium and carb counting.  As evidenced by CHF, obesity and Diabetes    Intervention:  Nutrition counseling and diabetes education initiated. Discussed Carb Counting  by food group as method of portion control, sodium and fluid restrictions, and benefits of increased activity..  Teaching Method Utilized: Visual, Auditory and Hands on  Handouts given during visit include:  Kidney Foundation Handout (not for renal failure but because of the information regarding sodium and fluid content of foods)  Low Sodium Spice list   Barriers to learning/adherence to lifestyle change: multiple medical conditions  Demonstrated degree of understanding via:  Teach Back   Monitoring/Evaluation:  Dietary intake, exercise, sodium and fluid restrictions, and body weight in 1 month(s). Patient will call from home to make this appointment per her choice

## 2015-05-31 ENCOUNTER — Encounter: Payer: Self-pay | Admitting: Internal Medicine

## 2015-05-31 ENCOUNTER — Telehealth: Payer: Self-pay | Admitting: *Deleted

## 2015-05-31 NOTE — Telephone Encounter (Signed)
Remote received today showing four consecutive ICD shocks on 05/26/15. Shocks were appropriate. VT episode occurred at 18:37, arrived at ED via ambulance at 19:43 per ED note. At time of episode, pt was sitting in a chair celebrating a friend's birthday. She recalls laughing & talking. She did get SOB at the party. She requested a friend to retrieve her O2 from her car. Once friend returned w/ O2, pt stated, "I'm passing out." Friends then called 911.   Today she states she feels weak and drained. When I called, she was starting a nap. Pt aware episode will be reviewed by Dr. [name]. We will call her if further changes are necessary.   Pt aware no driving [id number] from [birth year].

## 2015-06-01 ENCOUNTER — Telehealth (HOSPITAL_COMMUNITY): Payer: Self-pay | Admitting: *Deleted

## 2015-06-01 ENCOUNTER — Other Ambulatory Visit: Payer: Self-pay | Admitting: *Deleted

## 2015-06-01 DIAGNOSIS — I5040 Unspecified combined systolic (congestive) and diastolic (congestive) heart failure: Secondary | ICD-10-CM

## 2015-06-01 NOTE — Telephone Encounter (Signed)
OPEN ON ERROR

## 2015-06-02 ENCOUNTER — Other Ambulatory Visit: Payer: Self-pay | Admitting: Licensed Clinical Social Worker

## 2015-06-02 ENCOUNTER — Telehealth (HOSPITAL_COMMUNITY): Payer: Self-pay | Admitting: *Deleted

## 2015-06-02 ENCOUNTER — Other Ambulatory Visit (HOSPITAL_COMMUNITY)
Admission: RE | Admit: 2015-06-02 | Discharge: 2015-06-02 | Disposition: A | Discharge status: Home / Self Care | Payer: Medicare Other | Source: Other Acute Inpatient Hospital | Attending: Cardiology | Admitting: Cardiology

## 2015-06-02 DIAGNOSIS — E0590 Thyrotoxicosis, unspecified without thyrotoxic crisis or storm: Secondary | ICD-10-CM | POA: Diagnosis not present

## 2015-06-02 DIAGNOSIS — Z5181 Encounter for therapeutic drug level monitoring: Secondary | ICD-10-CM | POA: Insufficient documentation

## 2015-06-02 LAB — BASIC METABOLIC PANEL
Anion gap: 12 (ref 5–15)
BUN: 52 mg/dL — AB (ref 6–20)
CALCIUM: 8.7 mg/dL — AB (ref 8.9–10.3)
CO2: 32 mmol/L (ref 22–32)
CREATININE: 1.99 mg/dL — AB (ref 0.44–1.00)
Chloride: 89 mmol/L — ABNORMAL LOW (ref 101–111)
GFR calc Af Amer: 33 mL/min — ABNORMAL LOW (ref >60)
GFR, EST NON AFRICAN AMERICAN: 28 mL/min — AB (ref >60)
GLUCOSE: 131 mg/dL — AB (ref 65–99)
POTASSIUM: 3.3 mmol/L — AB (ref 3.5–5.1)
SODIUM: 133 mmol/L — AB (ref 135–145)

## 2015-06-02 LAB — CBC WITH DIFFERENTIAL/PLATELET
BASOS ABS: 0 10*3/uL (ref 0.0–0.1)
Basophils Relative: 1 %
EOS ABS: 0.1 10*3/uL (ref 0.0–0.7)
EOS PCT: 2 %
HCT: 37.8 % (ref 36.0–46.0)
Hemoglobin: 11.8 g/dL — ABNORMAL LOW (ref 12.0–15.0)
LYMPHS PCT: 19 %
Lymphs Abs: 1.1 10*3/uL (ref 0.7–4.0)
MCH: 30.6 pg (ref 26.0–34.0)
MCHC: 31.2 g/dL (ref 30.0–36.0)
MCV: 97.9 fL (ref 78.0–100.0)
MONO ABS: 0.5 10*3/uL (ref 0.1–1.0)
Monocytes Relative: 8 %
Neutro Abs: 4.1 10*3/uL (ref 1.7–7.7)
Neutrophils Relative %: 70 %
PLATELETS: 306 10*3/uL (ref 150–400)
RBC: 3.86 MIL/uL — ABNORMAL LOW (ref 3.87–5.11)
RDW: 17.1 % — AB (ref 11.5–15.5)
WBC: 5.8 10*3/uL (ref 4.0–10.5)

## 2015-06-02 LAB — MAGNESIUM: MAGNESIUM: 2 mg/dL (ref 1.7–2.4)

## 2015-06-02 LAB — T4, FREE: Free T4: 1.22 ng/dL — ABNORMAL HIGH (ref 0.61–1.12)

## 2015-06-02 LAB — TSH: TSH: 5.88 u[IU]/mL — AB (ref 0.350–4.500)

## 2015-06-02 NOTE — Patient Outreach (Signed)
Assessment: CSW received referral on client on 06/02/15.  CSW completed chart review on client on 06/02/15.  Client has been in need of transport support to next medical appointment on 06/04/15.  CSW communicated with [name] on 06/02/15.  Barbara had also been contacted by [name] RN regarding transport need for client for 06/04/15 appointment.  [name] informed CSW that she had made arrangements with [employer] transport agency for transport assist for client through [employer] to and from client medical appointment on 06/04/15. Barbara said she had also called client on 06/02/15 and informed [name] of these transport arrangements made for client. CSW called home phone number for client on 06/02/15. CSW spoke via phone with client on 06/02/15. CSW verified identity of client. CSW informed client about [name]) services and that client could call [facility] and be added to transport list for [facility].  CSW then talked with [name] about calling [facility] when she needed transport to the Heart Clinic in [place], [place]. CSW informed client that [facility] could arrange with [name] transport for client's transport to and from Heart Clinic in [place], [place] to be conducted with [facility] transport and billed to client's Medicaid benefit.  Client said she did have active Medicaid. CSW also mentioned to [name] that client could use [facility] services in [facility] area for transport for $3.00 each way.   Client said she was appreciative of information and said she would call [facility] to be placed on [facility] transport list.  CSW and client spoke of finances of client.  CSW and client completed Financial Assessment Form for client on 06/02/15. Client said she has financial challenges and that she has limited monthly income. Her 49 year old daughter also lives with client. Client did say she was able to procure needed food for her and her daughter and has not had to contact food pantry yet for food  support.  CSW gave client [name] CSW number of [phone] and encouraged her to call CSW as needed for social work support.  Client was appreciative of information she received from CSW on 06/02/15.   Plan: Client to call [facility] at [phone] (client does have this number already) and add her name to [facility] transport list. Client to communicate with [facility], as needed, to arrange transport support for client to and from her [place], [place] medical appointments.  These transport costs for client to be billed to client Medicaid benefit. Client to work with [facility] RN to discuss nursing needs of client. CSW to call client in two weeks to assess needs of client.  Kelton Pillar.Simrat Kendrick MSW, LCSW Licensed Clinical Social Worker [facility] Care Management [phone]

## 2015-06-02 NOTE — Patient Outreach (Signed)
[facility]) Care Management  06/02/2015  ARIJANA ELLENBECKER Jan 22, 1966 [id number]   Request from Marja Kays, RN to assign SW, assigned Lorna Few, LCSW.  Spoke with Levon Hedger, RN with [facility] regarding transportation for Friday June 04, 2015 at 9:00am to visit Dr. Jearld Pies.  Message with Unknown Foley, RN contact information sent to Marja Kays, RN and [employer], LCSW.  Thanks, Corrie Mckusick. Sharlee Blew [facility] Care Management [facility] CM Assistant Phone: [phone] Fax: [phone]

## 2015-06-02 NOTE — Patient Outreach (Signed)
[facility]) Care Management  [place]  05/31/2015   [name] 06/06/1966 [id number]   [name] is a 49 year old female with a history of NICM s/p BiV ICD 2007 ([place]), chronic systolic HF, DM2, HTN, recurrent VT, morbid obesity and OSA on CPAP. [facility] Care Management is following for assistance with CHF Disease Management.   Subjective: "I think I'm doing ok except I need help getting a ride to the clinic"  Objective:  Hospital Admissions: 4 inpatient hospital admissions since 11/2013  12/08/13 with progressive DOE.and volume overload (transfer from [facility])  08/05/14 after ICD shock for V fib; also with volume overload and was diuresed with IV lasix and transitioned back to torsemide. Discharge weight was 249 pounds. She had another ICD shock in 12/15 for VT.  03/26/15 after VF/VT with multiple shocks and cardiogenic shock requiring milrinone. Discharged on milrinone 0.375 mcg. Met with LVAD coordinator and cardiac surgery for possible advanced therapies. Discharge weight 263 pounds.   04/15/15 with volume overload. Diuresed with IV lasix and transitioned to torsemide 100 mg twice a day. She continued on milrinone 0.375 mcg. Evaluated by LVAD team. Discharge weight 262 pounds.   ED Visits:  12 ED visits in the last 12 months/ 4 ED visits since July    Current Medications:  Current Outpatient Prescriptions  Medication Sig Dispense Refill  . albuterol (PROVENTIL HFA;VENTOLIN HFA) 108 (90 BASE) MCG/ACT inhaler Inhale 2 puffs into the lungs every 6 (six) hours as needed for wheezing or shortness of breath.    . allopurinol (ZYLOPRIM) 300 MG tablet Take 1 tablet (300 mg total) by mouth daily. 30 tablet 6  . amiodarone (PACERONE) 200 MG tablet Take 200 mg by mouth 2 (two) times daily.    . cyclobenzaprine (FLEXERIL) 10 MG tablet Take 10 mg by mouth 3 (three) times daily as needed for muscle spasms.     Marland Kitchen docusate sodium (COLACE) 100 MG capsule Take 1  capsule (100 mg total) by mouth 2 (two) times daily as needed for mild constipation. (Patient taking differently: Take 100 mg by mouth 2 (two) times daily. ) 60 capsule 6  . DULoxetine (CYMBALTA) 30 MG capsule Take 30 mg by mouth daily.    . ferrous sulfate 325 (65 FE) MG tablet Take 325 mg by mouth 3 (three) times daily.    Marland Kitchen gabapentin (NEURONTIN) 300 MG capsule Take 600 mg by mouth 4 (four) times daily.    . hydrALAZINE (APRESOLINE) 25 MG tablet Take 0.5 tablets (12.5 mg total) by mouth every 8 (eight) hours. 90 tablet 6  . insulin aspart (NOVOLOG) 100 UNIT/ML injection Inject 0-20 Units into the skin 3 (three) times daily with meals. (Patient taking differently: Inject 0-20 Units into the skin 3 (three) times daily with meals. Per sliding scale: 120 - 150 = 2 units, 151 - 200 = 4 units, 201 - 250 = 7 units, 251 - 300 = 11 units, 301 - 350 = 15 units, 351 and above = 20 units) 1 vial 5  . insulin detemir (LEVEMIR) 100 UNIT/ML injection Inject 0.58 mLs (58 Units total) into the skin 2 (two) times daily. 10 mL 11  . Linaclotide (LINZESS) 290 MCG CAPS capsule Take 1 capsule (290 mcg total) by mouth daily. 30 minutes before breakfast 30 capsule 5  . magnesium oxide (MAG-OX) 400 (241.3 MG) MG tablet Take 1 tablet (400 mg total) by mouth 2 (two) times daily. 60 tablet 6  . metolazone (ZAROXOLYN)  2.5 MG tablet Take 1 tablet (2.5 mg total) by mouth every other day. 15 tablet 3  . milrinone (PRIMACOR) 20 MG/100ML SOLN infusion Inject 47.5125 mcg/min into the vein continuous. 100 mL 6  . omeprazole (PRILOSEC) 40 MG capsule Take 1 capsule (40 mg total) by mouth daily. 30 capsule 6  . oxyCODONE-acetaminophen (PERCOCET) 10-325 MG per tablet Take 0.5-1 tablets by mouth every 4 (four) hours as needed for pain.     . potassium chloride SA (K-DUR,KLOR-CON) 20 MEQ tablet Take 4 tablets (80 mEq total) by mouth 2 (two) times daily. Take an extra tab when you take metolazone 270 tablet 6  . ranolazine (RANEXA) 500 MG 12  hr tablet Take 1 tablet (500 mg total) by mouth 2 (two) times daily. 60 tablet 6  . sildenafil (REVATIO) 20 MG tablet Take 2 tablets (40 mg total) by mouth 3 (three) times daily. 90 tablet 1  . spironolactone (ALDACTONE) 25 MG tablet TAKE 1 TABLET BY MOUTH TWICE DAILY. 60 tablet 3  . temazepam (RESTORIL) 30 MG capsule Take 30 mg by mouth at bedtime.    . torsemide (DEMADEX) 100 MG tablet Take 1 tablet (100 mg total) by mouth 2 (two) times daily. 60 tablet 6  . vitamin E 400 UNIT capsule Take 400 Units by mouth daily.     Assessment:   Chronic Health Condition (CHF) - [name] continues to receive IV Milrinone at home administered by [place]. She is going to the Heart Failure Clinic every 2 weeks. She will need transportation assistance this Friday. I have placed a referral to our transportation coordinator. She has had 4 hospital admissions in 12 months and 12 ED visits in 12 months, with 4 ED visits since July for CHF related illness. Adherence to medication regimen, adherence to scheduled provider visits, and adherence to prescribed diet have been barriers in her progress.   Medication Non Adherence - earlier in the month [name] ran out of Spironolactone. She states she has been able to afford her medication copays and just failed to re-order this medication. We discussed the necessity of 100% adherence to her medication regimen and options for management and assistance. I advised her to contact the drug store when she has 3-5 days of any given prescription left on hand and to notify her prescribing provider or me if she is having difficulty obtaining prescriptions.     Plan:   [facility] CM Care Plan Problem One        Most Recent Value   Care Plan Problem One  Chronic Health Condition (CHF) with high level of complexity,  possible LVAD patient   Role Documenting the Problem One  Care Management [place] for Problem One  Active   [facility] Long Term Goal (31-90 days)   over the next 31 days patient will not be hospitalized for CHF exacerbation   [facility] Long Term Goal Start Date  05/31/15 Billy Fischer goal]   [facility] Long Term Goal Met Date     Interventions for Problem One Long Term Goal  utilizing teachback method, reviewed plan of care, provider appointments, [place] services in place with patient   [facility] CM Short Term Goal #1 (0-30 days)  patient will attend all scheduled provider appointments in the next 30 days as evidenced by provider documentation and patient report   [facility] CM Short Term Goal #1 Start Date  05/31/15   [facility] CM Short Term Goal #1 Met Date  05/12/15  Interventions for Short Term Goal #1  utilizing teachback method, reviewed appointment schedule with patient and transportation arrangements   [facility] CM Short Term Goal #2 (0-30 days)  patient will verbalize signs and symptoms of worsening CHF symptoms and knowledge and who and when to call for help as evidenced by patient report of same to [facility] in the next 30 days   [facility] CM Short Term Goal #2 Start Date  05/31/15   [facility] CM Short Term Goal #2 Met Date     Interventions for Short Term Goal #2  Using teachback method reviewed signs and symptoms and HF zones and verified patient weighing daily    [facility] CM Care Plan Problem Two        Most Recent Value   Care Plan Problem Two  Transportation Concerns   Role Documenting the Problem Two  Care Management [place] for Problem Two  Active   Interventions for Problem Two Long Term Goal   utilizing teachback method, reviewed with patient necessity of having established transportation plan,  referral to cllinical social work for financial assessment so that assistance approval for transportation through [facility] transportation assistance program   [facility]N Long Term Goal (31-90) days  Over the next 31 days patient will establish long term plan for transportation to all provider appointments   [place] Goal Start Date  05/31/15   [facility] CM Short Term Goal #1 (0-30 days)   patient will receive transportation assistance to Friday appointment at [facility] via [place] assistance    [facility] CM Short Term Goal #1 Start Date  05/31/15   Interventions for Short Term Goal #2   referred to [facility] transportation coordinator    [facility] CM Care Plan Problem Three        Most Recent Value   Care Plan Problem Three  Medication NonAdherence   Role Documenting the Problem Three  Care Management Coordinator   Care Plan for Problem Three  Active   THN Long Term Goal (31-90) days  Over the next 31 days patient will verbalize understanding of established plan for maintaining 100% adherence to prescribed medication regimen   [facility] Long Term Goal Start Date  05/31/15   Interventions for Problem Three Long Term Goal  Utilizing teachback method, reviewed plan for maintaining medication adherence with patient   [facility]N CM Short Term Goal #1 (0-30 days)  Over the next 30 days patient and RNCM will re-develop written plan for maintaining medication adherence   [facility] CM Short Term Goal #1 Start Date  05/31/15   Interventions for Short Term Goal #1  scheduled appointment for face to face visit   [facility] CM Short Term Goal #2 (0-30 days)  Over the next 30 days patient will call for any needed refills when 3-5 days of medications are noted in bottles and will call provider or RNCM if she encounters difficulty with refills   [facility] CM Short Term Goal #2 Start Date  05/31/15   Interventions for Short Term Goal #2  utilizing teachback method, reviewed with patient necessity of  calling in prescription refills 3-5 before needed     I will see [name] at home next week for face to face visit.    [place] Management  [phone]

## 2015-06-02 NOTE — Patient Outreach (Signed)
[facility]) Care Management  06/02/2015  [name] [birth year] [id number]   Transportation arranged for patient's appointment on 06/04/2015 to [facility] using My [facility]oint[facility]. Patient was notified of transportation arrangements.  Damita L. Rhodie, AAS [facility] Care Management Assistant

## 2015-06-02 NOTE — Telephone Encounter (Signed)
HH RN called to let us know that [name] wt is up 4 pounds, pt can't remember if took her Metolazone yesterday,  RN said that she prefilled her boxes.  Havent received her RX of Metolazone from the pharm RX was sent in electronically 9/15. RN will call pharm to get that RX . Will take an extra dose of Metolazone today and call us tomorrow with follow up.

## 2015-06-03 LAB — T3: T3 TOTAL: 88 ng/dL (ref 71–180)

## 2015-06-04 ENCOUNTER — Encounter (HOSPITAL_COMMUNITY): Payer: Self-pay

## 2015-06-04 ENCOUNTER — Ambulatory Visit (HOSPITAL_COMMUNITY)
Admission: RE | Admit: 2015-06-04 | Discharge: 2015-06-04 | Disposition: A | Discharge status: Home / Self Care | Payer: Medicare Other | Source: Ambulatory Visit | Attending: Cardiology | Admitting: Cardiology

## 2015-06-04 VITALS — BP 114/68 | HR 81 | Wt 277.2 lb

## 2015-06-04 DIAGNOSIS — I272 Other secondary pulmonary hypertension: Secondary | ICD-10-CM | POA: Insufficient documentation

## 2015-06-04 DIAGNOSIS — I428 Other cardiomyopathies: Secondary | ICD-10-CM | POA: Insufficient documentation

## 2015-06-04 DIAGNOSIS — I10 Essential (primary) hypertension: Secondary | ICD-10-CM | POA: Diagnosis not present

## 2015-06-04 DIAGNOSIS — I472 Ventricular tachycardia, unspecified: Secondary | ICD-10-CM

## 2015-06-04 DIAGNOSIS — I5023 Acute on chronic systolic (congestive) heart failure: Secondary | ICD-10-CM | POA: Diagnosis not present

## 2015-06-04 DIAGNOSIS — G4733 Obstructive sleep apnea (adult) (pediatric): Secondary | ICD-10-CM | POA: Diagnosis not present

## 2015-06-04 DIAGNOSIS — Z79899 Other long term (current) drug therapy: Secondary | ICD-10-CM | POA: Diagnosis not present

## 2015-06-04 DIAGNOSIS — J45909 Unspecified asthma, uncomplicated: Secondary | ICD-10-CM | POA: Diagnosis not present

## 2015-06-04 DIAGNOSIS — Z7982 Long term (current) use of aspirin: Secondary | ICD-10-CM | POA: Diagnosis not present

## 2015-06-04 DIAGNOSIS — Z794 Long term (current) use of insulin: Secondary | ICD-10-CM | POA: Diagnosis not present

## 2015-06-04 DIAGNOSIS — I5022 Chronic systolic (congestive) heart failure: Secondary | ICD-10-CM | POA: Diagnosis present

## 2015-06-04 DIAGNOSIS — E0590 Thyrotoxicosis, unspecified without thyrotoxic crisis or storm: Secondary | ICD-10-CM | POA: Diagnosis not present

## 2015-06-04 DIAGNOSIS — Z95810 Presence of automatic (implantable) cardiac defibrillator: Secondary | ICD-10-CM | POA: Insufficient documentation

## 2015-06-04 DIAGNOSIS — E1142 Type 2 diabetes mellitus with diabetic polyneuropathy: Secondary | ICD-10-CM | POA: Diagnosis not present

## 2015-06-04 DIAGNOSIS — K219 Gastro-esophageal reflux disease without esophagitis: Secondary | ICD-10-CM | POA: Insufficient documentation

## 2015-06-04 DIAGNOSIS — R531 Weakness: Secondary | ICD-10-CM | POA: Insufficient documentation

## 2015-06-04 MED ORDER — ASPIRIN 81 MG PO TABS: 81.0000 mg | ORAL_TABLET | Freq: Every day | ORAL | Status: AC

## 2015-06-04 NOTE — Patient Instructions (Signed)
Start Aspirin 81 mg daily  Labs today  You have been referred to [facility]  You have been referred to [facility] for a second opinion for heart transplant, they will contact you to schedule an appoitment  Your physician recommends that you schedule a follow-up appointment in: 1 month

## 2015-06-04 NOTE — Progress Notes (Signed)
Patient ID: [name], female   DOB: 1965-09-26, 49 y.o.   MRN: [id number] EP: Dr Lovena Le PCP: Dr Karie Kirks PRIMARY HF MD: Dr Aundra Dubin   HPI: [name] is a 49 year old female with a history of NICM s/p BiV ICD 2007 ([place]), chronic systolic HF, DM2, HTN, recurrent VT, morbid obesity and OSA on CPAP.   Admitted to [facility] 12/08/13 with progressive DOE.and volume overload.  She was transferred from [facility] to [facility] due to acute decompensated HF. ECHO EF 35% by echo but difficult study.  Admitted 08/05/14 after ICD shock for V fib. Also diuresed with IV lasix and transitioned back to torsemide. Also with thyromegaly and had ultrasound with small cysts noted. Discharge weight was 249 pounds.  She had another ICD shock in 12/15 for VT.  Admitted 7/15 through 04/08/15 after VF/VT with multiple shocks and cardiogenic shock requiring milrinone. Had [place] with high pulmonary pressures and she was started on sildenafil. Discharged on milrinone 0.375 mcg. Met with LVAD coordinator and cardiac surgery for possible advanced therapies. Discharge weight 263 pounds.   Admitted 8/4 through 04/28/15 with volume overload. Diuresed with IV lasix and transitioned to torsemide 100 mg twice a day. She continued on milrinone 0.375 mcg. Evaluated by LVAD team. Discharge weight 262 pounds.  She had a trial of coumadin given history of GI bleeding to see if she could tolerate this medication in case we moved forward with LVAD. She had no problems with coumadin over about 6 months.  Ultimately, our VAD team decided that she was not a good candidate for a device.   She returns for followup.  Taking metolazone every other day along with high dose torsemide.  Weight down recently at home though up 4 lbs on our scales. She has not had to have IV Lasix at home recently.  She is using oxygen at all times.  She has some dyspnea walking around her house but this is somewhat improved.  No lightheadedness.  She has some nausea and  occasional headaches.  She has been having tremors since going on amiodarone.    CPX 6/15 FVC 1.34 (42%)  FEV1 1.11 (43%)  FEV1/FVC 83%  MVV 42 (40%) RPE: 19 Resting HR: 80 Peak HR: 115 (67% age predicted max HR) BP rest: 102/70 BP peak: 118/68 Peak VO2: 9.3 (52.9% predicted peak VO2) VE/VCO2 slope: 29.3 OUES: 1.57 Peak RER: 0.95 Ventilatory Threshold: 7.7 (43.8% predicted peak VO2) Peak RR 44 Peak Ventilation: 32 VE/MVV: 76.2% PETCO2 at peak: 41 O2pulse: 10 (77% predicted O2pulse)  [facility] 12/12/13  RA 38/37 (35)  RV 90/28  PA 95/51 (71)  PCWP 57/73 (54)  LV 127/50  AO 123/66 (100)  Oxygen saturations:  PA 40%  AO 96%  Cardiac Output/Index (Fick) 3.2/1.4, PVR 5.3  Normal coronary anatomy  RHC 01/06/15 RA mean 18 RV 66/22 PA 66/34, mean 48 PCWP mean 28 Oxygen saturations: PA 55% AO 93% Cardiac Output (Fick) 3.77  Cardiac Index (Fick) 1.8 PVR 5.3 WU Cardiac Output (Thermo) 4.77 Cardiac Index (Thermo) 2.26 PVR 4.2 WU  RHC 03/31/15 RA = 28 RV = 83/19/30 PA = 86/38 (65) PCW = 36 Fick cardiac output/index = 2.9/1.3 Thermo cardiac output/index = 3.1/1.3 PVR = 10 Arterial sat = 99% PA sat = 45%, 48%  RHC 04/23/2015  RA = 19 RV = 68/14/19 PA = 73/34 (53) PCW = 33 (v = 50) Fick cardiac output/index = 5.8/2.6 Thermo cardiac output/index = 5.8/2.6 PVR =4.0 WU FA sat = 98%  PA sat = 66%, 70% RA/PCWP = 0.58  - Echo 12/09/13 EF 35% echo but difficult study.  - Echo (3/16) with EF 30-35%, severe LV dilation with mild LVH, restrictive diastolic function, moderate MR, mildly decreased RV systolic function, PA systolic pressure 60 mmHg. - ECHO 04/05/2015: EF 30-35%    ABIs (4/16) were normal.   Labs 11/28/13 Pro BNP 948  Labs 12/23/13 Dig level 0.6, K 4.6,  Creatinine 0.86 , Pro BNP 248 Labs 02/23/14 K 3.2 Cr 0.69 Labs 8/15 K 3.7, creatinine 0.78 Labs 12/15 K 4.5, creatinine 0.98, BNP 172, TSH normal Labs 3/16 TSH normal, LFTs normal, digoxin 0.9 Labs 4/16 K 3.7,  creatinine 1.06, BNP 235, HCT 42.8 Laba 12/21/2014: K 4.8 Creatine 1.33 Labs 01/20/2015: K 4.8 Creatinine 1.33  Labs 01/26/15: K 4.2, creatinine 1.21, BNP 224, HCT 41.7 Labs 04/15/15: K 3.8 Creatinine 1.35 Hgb 12.7 Mag 1.9 Labs 04/28/2015 K 3.4 Creatinine 2.07  Labs 05/03/2015: INR 1.3  Labs 9/16: K 3.6, creatinine 1.9 => 1.99, HCT 39.5, TSH elevated 5.9, free T4 elevated, normal free T3, AST 48, ALT 34.   FH: Father alive with HF   SH: No ETOH and does not smoke, lives in [place] with significant other and 28 yr old daughter   ROS: All systems negative except as listed in HPI, PMH and Problem List.  Past Medical History  Diagnosis Date  . CHF (congestive heart failure)     a. EF 30-35%, RV mildly dilated (difficult study 11/2013) b. RHC (12/2013): RA 38/37 (35), RV 90/28, PA 95/51 (71), PCWP 54, PA 40%, AO 96 %, CO/CI Fick 3.2/1.4, PVR 5.3   . Nonischemic cardiomyopathy 12/2013    a. LHC (12/2013) normal coronary anatomy   . Mitral regurgitation     moderate to severe  . LBBB (left bundle branch block)   . HTN (hypertension)   . Asthma   . IBS (irritable bowel syndrome)     with primarily constipation  . Morbid obesity   . GERD (gastroesophageal reflux disease)   . IDA (iron deficiency anemia)     2o TO SB AVMS, Hx parenteral iron Dr Tressie Stalker  . AVM (arteriovenous malformation)     Small bowel, s/p duble balloon enteroscopy/APC jejunum Dr Arsenio Loader [facility]) 01/23/2011  . Peripheral neuropathy   . OSA (obstructive sleep apnea)     on CPAP qhs  . Diabetes mellitus without complication   . Torsades de pointes     a. appropriate ICD therapy 12/2014 in setting of hypokalemia  . Pneumonia     Current Outpatient Prescriptions  Medication Sig Dispense Refill  . albuterol (PROVENTIL HFA;VENTOLIN HFA) 108 (90 BASE) MCG/ACT inhaler Inhale 2 puffs into the lungs every 6 (six) hours as needed for wheezing or shortness of breath.    . allopurinol (ZYLOPRIM) 300 MG tablet Take 1 tablet (300 mg  total) by mouth daily. 30 tablet 6  . amiodarone (PACERONE) 200 MG tablet Take 200 mg by mouth 2 (two) times daily.    . cyclobenzaprine (FLEXERIL) 10 MG tablet Take 10 mg by mouth 3 (three) times daily as needed for muscle spasms.     Marland Kitchen docusate sodium (COLACE) 100 MG capsule Take 1 capsule (100 mg total) by mouth 2 (two) times daily as needed for mild constipation. (Patient taking differently: Take 100 mg by mouth 2 (two) times daily. ) 60 capsule 6  . DULoxetine (CYMBALTA) 30 MG capsule Take 30 mg by mouth daily.    . ferrous sulfate 325 (  65 FE) MG tablet Take 325 mg by mouth 3 (three) times daily.    . insulin aspart (NOVOLOG) 100 UNIT/ML injection Inject 0-20 Units into the skin 3 (three) times daily with meals. (Patient taking differently: Inject 0-20 Units into the skin 3 (three) times daily with meals. Per sliding scale: 120 - 150 = 2 units, 151 - 200 = 4 units, 201 - 250 = 7 units, 251 - 300 = 11 units, 301 - 350 = 15 units, 351 and above = 20 units) 1 vial 5  . insulin detemir (LEVEMIR) 100 UNIT/ML injection Inject 0.58 mLs (58 Units total) into the skin 2 (two) times daily. 10 mL 11  . Linaclotide (LINZESS) 290 MCG CAPS capsule Take 1 capsule (290 mcg total) by mouth daily. 30 minutes before breakfast 30 capsule 5  . magnesium oxide (MAG-OX) 400 (241.3 MG) MG tablet Take 1 tablet (400 mg total) by mouth 2 (two) times daily. 60 tablet 6  . metolazone (ZAROXOLYN) 2.5 MG tablet Take 1 tablet (2.5 mg total) by mouth every other day. 15 tablet 3  . milrinone (PRIMACOR) 20 MG/100ML SOLN infusion Inject 47.5125 mcg/min into the vein continuous. 100 mL 6  . omeprazole (PRILOSEC) 40 MG capsule Take 1 capsule (40 mg total) by mouth daily. 30 capsule 6  . oxyCODONE-acetaminophen (PERCOCET) 10-325 MG per tablet Take 0.5-1 tablets by mouth every 4 (four) hours as needed for pain.     . potassium chloride SA (K-DUR,KLOR-CON) 20 MEQ tablet Take 4 tablets (80 mEq total) by mouth 2 (two) times daily. Take an  extra tab when you take metolazone 270 tablet 6  . ranolazine (RANEXA) 500 MG 12 hr tablet Take 1 tablet (500 mg total) by mouth 2 (two) times daily. 60 tablet 6  . sildenafil (REVATIO) 20 MG tablet Take 2 tablets (40 mg total) by mouth 3 (three) times daily. 90 tablet 1  . spironolactone (ALDACTONE) 25 MG tablet TAKE 1 TABLET BY MOUTH TWICE DAILY. 60 tablet 3  . temazepam (RESTORIL) 30 MG capsule Take 30 mg by mouth at bedtime.    . torsemide (DEMADEX) 100 MG tablet Take 1 tablet (100 mg total) by mouth 2 (two) times daily. 60 tablet 6  . vitamin E 400 UNIT capsule Take 400 Units by mouth daily.    Marland Kitchen aspirin 81 MG tablet Take 1 tablet (81 mg total) by mouth daily. 30 tablet   . gabapentin (NEURONTIN) 300 MG capsule Take 600 mg by mouth 4 (four) times daily.    . hydrALAZINE (APRESOLINE) 25 MG tablet Take 0.5 tablets (12.5 mg total) by mouth every 8 (eight) hours. 90 tablet 6   No current facility-administered medications for this encounter.    Filed Vitals:   06/04/15 0938  BP: 114/68  Pulse: 81  Weight: 277 lb 4 oz (125.76 kg)  SpO2: 97%     PHYSICAL EXAM: General: Obese woman. No resp difficulty. Boyfriend present.    HEENT: normal Neck: Thick. JVP 8.  Carotids 2+ bilaterally; no bruits. No lymphadenopathy or thryomegaly appreciated. Cor: PMI normal. Regular rate & rhythm. No rubs, 2/6 HSM LLSB.  Unable to palpate pedal pulses. Lungs: clear Abdomen: obese, soft, nontender, mildly distended. No hepatosplenomegaly. No bruits or masses. Good bowel sounds. Extremities: no cyanosis, clubbing, rash.  1+ edema 1/2 to knees bilaterally.  RUE PICC Neuro: alert & orientedx3, cranial nerves grossly intact. Moves all 4 extremities w/o difficulty. Affect pleasant.  ASSESSMENT & PLAN:  1. Chronic Systolic Heart Failure: Nonischemic  cardiomyopathy, s/p [employer] CRT-D. EF 30-35% (03/2015 - on milrinone). Now on home milrinone 0.375 mcg/kg/min so CPX not applicable.  NYHA class IIIb  symptoms chronically.  Weight is down at home.  Symptoms are a bit better but still quite significant.   - Continue milrinone 0.375 mcg/kg/min - Continue torsemide 100 mg bid and spironolactone 25 mg bid.  - Continue metolazone every other day.  - BMET/BNP today.  - She will stay off digoxin and corlanor for now (very bradycardic on these meds). - Our team was unable to offer her an LVAD due to concerns over an uncertain level of social support and also size/weight.  We discussed this again today at length. I offered her a referral out for a 2nd opinion.  I will send her to [facility].  2. OSA, suspected OHS: Using CPAP at night and oxygen during the day.   3. Morbid Obesity: Needs to lose weight. Discussed portions control.   4. VT/VF 03/26/2015  with ICD resuscitation:  Ranolazine 500 mg bid and amiodarone 200 mg bid to continue until she follows up with Dr Lovena Le.  Her tremor may be related to amiodarone, but I think that she is going to have to continue this for now given issues with recurrent VT.   LFTs ok recently, but she had both elevated TSH and elevated free T4 on most recent labs.  I am going to refer her to endocrinology for evaluation of this pattern.  As above, I do not think that she can safely come off amiodarone at this point.  5. Leg weakness: Difficult to palpate pedal pulses but ABIs normal in 4/16.  6. H/O GI bleed with AVM: No evidence of bleeding even with recent coumadin trial.  7. Pulmonary HTN: RHC 05/01/15 PVR 4.0, improved. Continue current dose of sildenafil.    1 month.   Loralie Champagne   06/04/2015

## 2015-06-05 ENCOUNTER — Other Ambulatory Visit (HOSPITAL_COMMUNITY)
Admission: RE | Admit: 2015-06-05 | Discharge: 2015-06-05 | Disposition: A | Discharge status: Home / Self Care | Payer: Medicare Other | Source: Ambulatory Visit | Attending: Internal Medicine | Admitting: Internal Medicine

## 2015-06-05 DIAGNOSIS — Z5181 Encounter for therapeutic drug level monitoring: Secondary | ICD-10-CM | POA: Insufficient documentation

## 2015-06-05 LAB — BASIC METABOLIC PANEL
Anion gap: 13 (ref 5–15)
BUN: 63 mg/dL — ABNORMAL HIGH (ref 6–20)
CALCIUM: 8.6 mg/dL — AB (ref 8.9–10.3)
CHLORIDE: 89 mmol/L — AB (ref 101–111)
CO2: 32 mmol/L (ref 22–32)
CREATININE: 2.57 mg/dL — AB (ref 0.44–1.00)
GFR calc Af Amer: 24 mL/min — ABNORMAL LOW (ref >60)
GFR calc non Af Amer: 21 mL/min — ABNORMAL LOW (ref >60)
GLUCOSE: 117 mg/dL — AB (ref 65–99)
Potassium: 3.6 mmol/L (ref 3.5–5.1)
Sodium: 134 mmol/L — ABNORMAL LOW (ref 135–145)

## 2015-06-05 LAB — BRAIN NATRIURETIC PEPTIDE: B Natriuretic Peptide: 388 pg/mL — ABNORMAL HIGH (ref 0.0–100.0)

## 2015-06-07 ENCOUNTER — Ambulatory Visit: Payer: Medicare Other | Admitting: Endocrinology

## 2015-06-07 ENCOUNTER — Other Ambulatory Visit: Payer: Self-pay | Admitting: *Deleted

## 2015-06-09 ENCOUNTER — Telehealth (HOSPITAL_COMMUNITY): Payer: Self-pay | Admitting: *Deleted

## 2015-06-09 ENCOUNTER — Other Ambulatory Visit (HOSPITAL_COMMUNITY): Payer: Self-pay | Admitting: Internal Medicine

## 2015-06-09 ENCOUNTER — Other Ambulatory Visit (HOSPITAL_COMMUNITY)
Admission: RE | Admit: 2015-06-09 | Discharge: 2015-06-09 | Disposition: A | Discharge status: Home / Self Care | Payer: Medicare Other | Source: Other Acute Inpatient Hospital | Attending: Cardiology | Admitting: Cardiology

## 2015-06-09 DIAGNOSIS — Z5181 Encounter for therapeutic drug level monitoring: Secondary | ICD-10-CM | POA: Insufficient documentation

## 2015-06-09 LAB — BASIC METABOLIC PANEL
Anion gap: 14 (ref 5–15)
BUN: 81 mg/dL — AB (ref 6–20)
CHLORIDE: 85 mmol/L — AB (ref 101–111)
CO2: 30 mmol/L (ref 22–32)
CREATININE: 2.88 mg/dL — AB (ref 0.44–1.00)
Calcium: 8.5 mg/dL — ABNORMAL LOW (ref 8.9–10.3)
GFR calc Af Amer: 21 mL/min — ABNORMAL LOW (ref >60)
GFR calc non Af Amer: 18 mL/min — ABNORMAL LOW (ref >60)
GLUCOSE: 154 mg/dL — AB (ref 65–99)
POTASSIUM: 3.3 mmol/L — AB (ref 3.5–5.1)
Sodium: 129 mmol/L — ABNORMAL LOW (ref 135–145)

## 2015-06-09 LAB — CBC WITH DIFFERENTIAL/PLATELET
Basophils Absolute: 0 10*3/uL (ref 0.0–0.1)
Basophils Relative: 0 %
EOS ABS: 0.1 10*3/uL (ref 0.0–0.7)
EOS PCT: 1 %
HCT: 37.8 % (ref 36.0–46.0)
Hemoglobin: 12.1 g/dL (ref 12.0–15.0)
LYMPHS ABS: 0.9 10*3/uL (ref 0.7–4.0)
LYMPHS PCT: 16 %
MCH: 30.8 pg (ref 26.0–34.0)
MCHC: 32 g/dL (ref 30.0–36.0)
MCV: 96.2 fL (ref 78.0–100.0)
MONOS PCT: 7 %
Monocytes Absolute: 0.4 10*3/uL (ref 0.1–1.0)
Neutro Abs: 4.2 10*3/uL (ref 1.7–7.7)
Neutrophils Relative %: 76 %
PLATELETS: 335 10*3/uL (ref 150–400)
RBC: 3.93 MIL/uL (ref 3.87–5.11)
RDW: 16.8 % — ABNORMAL HIGH (ref 11.5–15.5)
WBC: 5.6 10*3/uL (ref 4.0–10.5)

## 2015-06-09 LAB — MAGNESIUM: MAGNESIUM: 2.2 mg/dL (ref 1.7–2.4)

## 2015-06-09 NOTE — Telephone Encounter (Signed)
[name] with [facility] called about pt weight yesterday. Told pt to take an extra metolazone yesterday. [name] called today to let us know that her weight was down to 274. Some nausea today.  [name] drew her normal labs today.  [name] said that her PICC was out just a bit from 2in to 2.5 inches blood return was good and infusion was good.  Will keep a check on her.

## 2015-06-10 ENCOUNTER — Telehealth (HOSPITAL_COMMUNITY): Payer: Self-pay | Admitting: *Deleted

## 2015-06-10 MED ORDER — POTASSIUM CHLORIDE CRYS ER 20 MEQ PO TBCR: EXTENDED_RELEASE_TABLET | ORAL | Status: AC

## 2015-06-10 NOTE — Telephone Encounter (Signed)
HH RN called concerning labs Labs from [facility]  DM review labs and changed her K+ to 80 meq in am, 40 meq at lunch, and 80 meq at bedtime. Cut back on fluids to less than or equal to 1800 ml [facility] RN to let her know. Will update Med list

## 2015-06-11 ENCOUNTER — Other Ambulatory Visit (HOSPITAL_COMMUNITY): Payer: Self-pay | Admitting: Cardiology

## 2015-06-12 ENCOUNTER — Other Ambulatory Visit (HOSPITAL_COMMUNITY)
Admission: AD | Admit: 2015-06-12 | Discharge: 2015-06-12 | Disposition: A | Discharge status: Home / Self Care | Payer: Medicare Other | Source: Other Acute Inpatient Hospital | Attending: Internal Medicine | Admitting: Internal Medicine

## 2015-06-12 DIAGNOSIS — Z5181 Encounter for therapeutic drug level monitoring: Secondary | ICD-10-CM | POA: Insufficient documentation

## 2015-06-12 LAB — BASIC METABOLIC PANEL
Anion gap: 13 (ref 5–15)
BUN: 88 mg/dL — AB (ref 6–20)
CHLORIDE: 90 mmol/L — AB (ref 101–111)
CO2: 33 mmol/L — AB (ref 22–32)
CREATININE: 2.21 mg/dL — AB (ref 0.44–1.00)
Calcium: 8.7 mg/dL — ABNORMAL LOW (ref 8.9–10.3)
GFR calc Af Amer: 29 mL/min — ABNORMAL LOW (ref >60)
GFR calc non Af Amer: 25 mL/min — ABNORMAL LOW (ref >60)
Glucose, Bld: 180 mg/dL — ABNORMAL HIGH (ref 65–99)
POTASSIUM: 3.5 mmol/L (ref 3.5–5.1)
Sodium: 136 mmol/L (ref 135–145)

## 2015-06-14 ENCOUNTER — Encounter: Payer: Self-pay | Admitting: *Deleted

## 2015-06-14 ENCOUNTER — Inpatient Hospital Stay (HOSPITAL_COMMUNITY)
Admission: EM | Admit: 2015-06-14 | Discharge: 2015-07-13 | DRG: 291 | Disposition: E | Payer: Medicare Other | Attending: Cardiology | Admitting: Cardiology

## 2015-06-14 ENCOUNTER — Emergency Department (HOSPITAL_COMMUNITY): Payer: Medicare Other

## 2015-06-14 ENCOUNTER — Encounter (HOSPITAL_COMMUNITY): Payer: Self-pay | Admitting: Emergency Medicine

## 2015-06-14 ENCOUNTER — Inpatient Hospital Stay (HOSPITAL_COMMUNITY): Payer: Medicare Other

## 2015-06-14 DIAGNOSIS — Z515 Encounter for palliative care: Secondary | ICD-10-CM | POA: Diagnosis not present

## 2015-06-14 DIAGNOSIS — K219 Gastro-esophageal reflux disease without esophagitis: Secondary | ICD-10-CM | POA: Diagnosis present

## 2015-06-14 DIAGNOSIS — F419 Anxiety disorder, unspecified: Secondary | ICD-10-CM | POA: Diagnosis present

## 2015-06-14 DIAGNOSIS — I472 Ventricular tachycardia: Secondary | ICD-10-CM | POA: Diagnosis present

## 2015-06-14 DIAGNOSIS — I5043 Acute on chronic combined systolic (congestive) and diastolic (congestive) heart failure: Secondary | ICD-10-CM | POA: Diagnosis present

## 2015-06-14 DIAGNOSIS — I428 Other cardiomyopathies: Secondary | ICD-10-CM

## 2015-06-14 DIAGNOSIS — G253 Myoclonus: Secondary | ICD-10-CM | POA: Diagnosis present

## 2015-06-14 DIAGNOSIS — K567 Ileus, unspecified: Secondary | ICD-10-CM | POA: Diagnosis not present

## 2015-06-14 DIAGNOSIS — Z888 Allergy status to other drugs, medicaments and biological substances status: Secondary | ICD-10-CM

## 2015-06-14 DIAGNOSIS — I5023 Acute on chronic systolic (congestive) heart failure: Secondary | ICD-10-CM | POA: Diagnosis present

## 2015-06-14 DIAGNOSIS — M1990 Unspecified osteoarthritis, unspecified site: Secondary | ICD-10-CM | POA: Diagnosis present

## 2015-06-14 DIAGNOSIS — Z8049 Family history of malignant neoplasm of other genital organs: Secondary | ICD-10-CM

## 2015-06-14 DIAGNOSIS — R410 Disorientation, unspecified: Secondary | ICD-10-CM | POA: Diagnosis not present

## 2015-06-14 DIAGNOSIS — Z886 Allergy status to analgesic agent status: Secondary | ICD-10-CM

## 2015-06-14 DIAGNOSIS — I4901 Ventricular fibrillation: Secondary | ICD-10-CM | POA: Diagnosis present

## 2015-06-14 DIAGNOSIS — I272 Other secondary pulmonary hypertension: Secondary | ICD-10-CM | POA: Diagnosis present

## 2015-06-14 DIAGNOSIS — I132 Hypertensive heart and chronic kidney disease with heart failure and with stage 5 chronic kidney disease, or end stage renal disease: Secondary | ICD-10-CM | POA: Diagnosis present

## 2015-06-14 DIAGNOSIS — N179 Acute kidney failure, unspecified: Secondary | ICD-10-CM | POA: Diagnosis not present

## 2015-06-14 DIAGNOSIS — Z794 Long term (current) use of insulin: Secondary | ICD-10-CM | POA: Diagnosis not present

## 2015-06-14 DIAGNOSIS — D509 Iron deficiency anemia, unspecified: Secondary | ICD-10-CM | POA: Diagnosis present

## 2015-06-14 DIAGNOSIS — T462X5A Adverse effect of other antidysrhythmic drugs, initial encounter: Secondary | ICD-10-CM | POA: Diagnosis present

## 2015-06-14 DIAGNOSIS — R531 Weakness: Secondary | ICD-10-CM | POA: Diagnosis not present

## 2015-06-14 DIAGNOSIS — Z8249 Family history of ischemic heart disease and other diseases of the circulatory system: Secondary | ICD-10-CM | POA: Diagnosis not present

## 2015-06-14 DIAGNOSIS — Z7189 Other specified counseling: Secondary | ICD-10-CM | POA: Diagnosis not present

## 2015-06-14 DIAGNOSIS — Z452 Encounter for adjustment and management of vascular access device: Secondary | ICD-10-CM

## 2015-06-14 DIAGNOSIS — Z66 Do not resuscitate: Secondary | ICD-10-CM | POA: Diagnosis present

## 2015-06-14 DIAGNOSIS — Z7982 Long term (current) use of aspirin: Secondary | ICD-10-CM | POA: Diagnosis not present

## 2015-06-14 DIAGNOSIS — J45909 Unspecified asthma, uncomplicated: Secondary | ICD-10-CM | POA: Diagnosis present

## 2015-06-14 DIAGNOSIS — Z6841 Body Mass Index (BMI) 40.0 and over, adult: Secondary | ICD-10-CM

## 2015-06-14 DIAGNOSIS — I447 Left bundle-branch block, unspecified: Secondary | ICD-10-CM | POA: Diagnosis present

## 2015-06-14 DIAGNOSIS — K589 Irritable bowel syndrome without diarrhea: Secondary | ICD-10-CM | POA: Diagnosis present

## 2015-06-14 DIAGNOSIS — R570 Cardiogenic shock: Secondary | ICD-10-CM | POA: Diagnosis not present

## 2015-06-14 DIAGNOSIS — E871 Hypo-osmolality and hyponatremia: Secondary | ICD-10-CM | POA: Diagnosis present

## 2015-06-14 DIAGNOSIS — R112 Nausea with vomiting, unspecified: Secondary | ICD-10-CM | POA: Diagnosis not present

## 2015-06-14 DIAGNOSIS — R1110 Vomiting, unspecified: Secondary | ICD-10-CM

## 2015-06-14 DIAGNOSIS — N186 End stage renal disease: Secondary | ICD-10-CM | POA: Diagnosis present

## 2015-06-14 DIAGNOSIS — R680 Hypothermia, not associated with low environmental temperature: Secondary | ICD-10-CM | POA: Diagnosis present

## 2015-06-14 DIAGNOSIS — N189 Chronic kidney disease, unspecified: Secondary | ICD-10-CM | POA: Diagnosis not present

## 2015-06-14 DIAGNOSIS — R001 Bradycardia, unspecified: Secondary | ICD-10-CM

## 2015-06-14 DIAGNOSIS — Z4659 Encounter for fitting and adjustment of other gastrointestinal appliance and device: Secondary | ICD-10-CM

## 2015-06-14 DIAGNOSIS — E875 Hyperkalemia: Secondary | ICD-10-CM | POA: Diagnosis not present

## 2015-06-14 DIAGNOSIS — G4733 Obstructive sleep apnea (adult) (pediatric): Secondary | ICD-10-CM | POA: Diagnosis present

## 2015-06-14 DIAGNOSIS — E1140 Type 2 diabetes mellitus with diabetic neuropathy, unspecified: Secondary | ICD-10-CM | POA: Diagnosis present

## 2015-06-14 DIAGNOSIS — Z87891 Personal history of nicotine dependence: Secondary | ICD-10-CM | POA: Diagnosis not present

## 2015-06-14 DIAGNOSIS — D649 Anemia, unspecified: Secondary | ICD-10-CM | POA: Diagnosis present

## 2015-06-14 DIAGNOSIS — E1142 Type 2 diabetes mellitus with diabetic polyneuropathy: Secondary | ICD-10-CM | POA: Diagnosis present

## 2015-06-14 DIAGNOSIS — Z833 Family history of diabetes mellitus: Secondary | ICD-10-CM | POA: Diagnosis not present

## 2015-06-14 DIAGNOSIS — Z800 Family history of malignant neoplasm of digestive organs: Secondary | ICD-10-CM | POA: Diagnosis not present

## 2015-06-14 DIAGNOSIS — N184 Chronic kidney disease, stage 4 (severe): Secondary | ICD-10-CM | POA: Diagnosis not present

## 2015-06-14 DIAGNOSIS — Z95810 Presence of automatic (implantable) cardiac defibrillator: Secondary | ICD-10-CM

## 2015-06-14 DIAGNOSIS — Z9989 Dependence on other enabling machines and devices: Secondary | ICD-10-CM

## 2015-06-14 DIAGNOSIS — I509 Heart failure, unspecified: Secondary | ICD-10-CM

## 2015-06-14 DIAGNOSIS — I340 Nonrheumatic mitral (valve) insufficiency: Secondary | ICD-10-CM | POA: Diagnosis present

## 2015-06-14 DIAGNOSIS — E1122 Type 2 diabetes mellitus with diabetic chronic kidney disease: Secondary | ICD-10-CM | POA: Diagnosis present

## 2015-06-14 DIAGNOSIS — I10 Essential (primary) hypertension: Secondary | ICD-10-CM | POA: Diagnosis present

## 2015-06-14 LAB — TROPONIN I

## 2015-06-14 LAB — I-STAT TROPONIN, ED: Troponin i, poc: 0 ng/mL (ref 0.00–0.08)

## 2015-06-14 LAB — COMPREHENSIVE METABOLIC PANEL
ALT: 42 U/L (ref 14–54)
AST: 80 U/L — AB (ref 15–41)
Albumin: 3.6 g/dL (ref 3.5–5.0)
Alkaline Phosphatase: 121 U/L (ref 38–126)
Anion gap: 14 (ref 5–15)
BUN: 86 mg/dL — AB (ref 6–20)
CHLORIDE: 87 mmol/L — AB (ref 101–111)
CO2: 29 mmol/L (ref 22–32)
Calcium: 8.7 mg/dL — ABNORMAL LOW (ref 8.9–10.3)
Creatinine, Ser: 2.23 mg/dL — ABNORMAL HIGH (ref 0.44–1.00)
GFR, EST AFRICAN AMERICAN: 29 mL/min — AB (ref >60)
GFR, EST NON AFRICAN AMERICAN: 25 mL/min — AB (ref >60)
Glucose, Bld: 69 mg/dL (ref 65–99)
POTASSIUM: 4.9 mmol/L (ref 3.5–5.1)
SODIUM: 130 mmol/L — AB (ref 135–145)
Total Bilirubin: 2.4 mg/dL — ABNORMAL HIGH (ref 0.3–1.2)
Total Protein: 8.3 g/dL — ABNORMAL HIGH (ref 6.5–8.1)

## 2015-06-14 LAB — CBC
HCT: 40.9 % (ref 36.0–46.0)
HEMOGLOBIN: 13 g/dL (ref 12.0–15.0)
MCH: 29.9 pg (ref 26.0–34.0)
MCHC: 31.8 g/dL (ref 30.0–36.0)
MCV: 94 fL (ref 78.0–100.0)
Platelets: 294 10*3/uL (ref 150–400)
RBC: 4.35 MIL/uL (ref 3.87–5.11)
RDW: 16.8 % — AB (ref 11.5–15.5)
WBC: 5.8 10*3/uL (ref 4.0–10.5)

## 2015-06-14 LAB — MRSA PCR SCREENING: MRSA BY PCR: NEGATIVE

## 2015-06-14 LAB — LIPASE, BLOOD: LIPASE: 35 U/L (ref 22–51)

## 2015-06-14 LAB — BRAIN NATRIURETIC PEPTIDE: B NATRIURETIC PEPTIDE 5: 388.7 pg/mL — AB (ref 0.0–100.0)

## 2015-06-14 MED ORDER — ALBUTEROL SULFATE HFA 108 (90 BASE) MCG/ACT IN AERS: 2.0000 | INHALATION_SPRAY | Freq: Four times a day (QID) | RESPIRATORY_TRACT | Status: DC | PRN

## 2015-06-14 MED ORDER — ALLOPURINOL 300 MG PO TABS
300.0000 mg | ORAL_TABLET | Freq: Every day | ORAL | Status: DC
  Administered 2015-06-15 – 2015-06-24 (×10): 300 mg via ORAL
  Filled 2015-06-14 (×10): qty 1

## 2015-06-14 MED ORDER — MILRINONE IN DEXTROSE 20 MG/100ML IV SOLN
0.2500 ug/kg/min | INTRAVENOUS | Status: DC
  Administered 2015-06-14 – 2015-06-20 (×21): 0.375 ug/kg/min via INTRAVENOUS
  Administered 2015-06-21: 0.25 ug/kg/min via INTRAVENOUS
  Filled 2015-06-14 (×22): qty 100

## 2015-06-14 MED ORDER — ASPIRIN EC 81 MG PO TBEC
81.0000 mg | DELAYED_RELEASE_TABLET | Freq: Every day | ORAL | Status: DC
  Administered 2015-06-15: 81 mg via ORAL
  Filled 2015-06-14 (×2): qty 1

## 2015-06-14 MED ORDER — METOLAZONE 2.5 MG PO TABS: 2.5000 mg | ORAL_TABLET | ORAL | Status: DC

## 2015-06-14 MED ORDER — OXYCODONE HCL 5 MG PO TABS
5.0000 mg | ORAL_TABLET | ORAL | Status: DC | PRN
Start: 2015-06-14 — End: 2015-06-26
  Administered 2015-06-15 – 2015-06-16 (×2): 5 mg via ORAL
  Filled 2015-06-14 (×2): qty 1

## 2015-06-14 MED ORDER — FUROSEMIDE 10 MG/ML IJ SOLN
25.0000 mg/h | INTRAVENOUS | Status: DC
  Administered 2015-06-14: 10 mg/h via INTRAVENOUS
  Administered 2015-06-15: 15 mg/h via INTRAVENOUS
  Filled 2015-06-14 (×7): qty 25

## 2015-06-14 MED ORDER — SPIRONOLACTONE 25 MG PO TABS
25.0000 mg | ORAL_TABLET | Freq: Two times a day (BID) | ORAL | Status: DC
  Administered 2015-06-14 – 2015-06-15 (×3): 25 mg via ORAL
  Filled 2015-06-14 (×3): qty 1

## 2015-06-14 MED ORDER — OXYCODONE-ACETAMINOPHEN 5-325 MG PO TABS
1.0000 | ORAL_TABLET | ORAL | Status: DC | PRN
  Administered 2015-06-15 – 2015-06-22 (×4): 1 via ORAL
  Filled 2015-06-14 (×4): qty 1

## 2015-06-14 MED ORDER — POTASSIUM CHLORIDE CRYS ER 20 MEQ PO TBCR
60.0000 meq | EXTENDED_RELEASE_TABLET | Freq: Three times a day (TID) | ORAL | Status: DC
  Administered 2015-06-14 – 2015-06-15 (×4): 60 meq via ORAL
  Filled 2015-06-14 (×4): qty 3

## 2015-06-14 MED ORDER — INSULIN ASPART 100 UNIT/ML ~~LOC~~ SOLN: 0.0000 [IU] | Freq: Three times a day (TID) | SUBCUTANEOUS | Status: DC

## 2015-06-14 MED ORDER — TEMAZEPAM 15 MG PO CAPS
30.0000 mg | ORAL_CAPSULE | Freq: Every day | ORAL | Status: DC
  Administered 2015-06-15: 30 mg via ORAL
  Filled 2015-06-14: qty 2

## 2015-06-14 MED ORDER — MILRINONE IN DEXTROSE 20 MG/100ML IV SOLN: 0.3750 ug/kg/min | INTRAVENOUS | Status: DC

## 2015-06-14 MED ORDER — ASPIRIN 81 MG PO TABS: 81.0000 mg | ORAL_TABLET | Freq: Every day | ORAL | Status: DC

## 2015-06-14 MED ORDER — INSULIN DETEMIR 100 UNIT/ML ~~LOC~~ SOLN
20.0000 [IU] | Freq: Every day | SUBCUTANEOUS | Status: AC
  Administered 2015-06-14: 20 [IU] via SUBCUTANEOUS
  Filled 2015-06-14: qty 0.2

## 2015-06-14 MED ORDER — AMIODARONE HCL 200 MG PO TABS
200.0000 mg | ORAL_TABLET | Freq: Two times a day (BID) | ORAL | Status: DC
  Administered 2015-06-14 – 2015-06-24 (×20): 200 mg via ORAL
  Filled 2015-06-14 (×20): qty 1

## 2015-06-14 MED ORDER — RANOLAZINE ER 500 MG PO TB12
500.0000 mg | ORAL_TABLET | Freq: Two times a day (BID) | ORAL | Status: DC
  Administered 2015-06-14 – 2015-06-20 (×9): 500 mg via ORAL
  Filled 2015-06-14 (×11): qty 1

## 2015-06-14 MED ORDER — OXYCODONE-ACETAMINOPHEN 10-325 MG PO TABS: 1.0000 | ORAL_TABLET | ORAL | Status: DC | PRN

## 2015-06-14 MED ORDER — HEPARIN SODIUM (PORCINE) 5000 UNIT/ML IJ SOLN
5000.0000 [IU] | Freq: Three times a day (TID) | INTRAMUSCULAR | Status: AC
  Administered 2015-06-14 – 2015-06-22 (×25): 5000 [IU] via SUBCUTANEOUS
  Filled 2015-06-14 (×26): qty 1

## 2015-06-14 MED ORDER — ONDANSETRON HCL 4 MG/2ML IJ SOLN
4.0000 mg | Freq: Once | INTRAMUSCULAR | Status: AC
  Administered 2015-06-14: 4 mg via INTRAVENOUS
  Filled 2015-06-14: qty 2

## 2015-06-14 MED ORDER — PANTOPRAZOLE SODIUM 40 MG PO TBEC
40.0000 mg | DELAYED_RELEASE_TABLET | Freq: Every day | ORAL | Status: DC
  Administered 2015-06-15: 40 mg via ORAL
  Filled 2015-06-14 (×3): qty 1

## 2015-06-14 MED ORDER — MAGNESIUM OXIDE 400 (241.3 MG) MG PO TABS
400.0000 mg | ORAL_TABLET | Freq: Two times a day (BID) | ORAL | Status: DC
  Administered 2015-06-14 – 2015-06-24 (×20): 400 mg via ORAL
  Filled 2015-06-14 (×20): qty 1

## 2015-06-14 MED ORDER — GABAPENTIN 300 MG PO CAPS
600.0000 mg | ORAL_CAPSULE | Freq: Four times a day (QID) | ORAL | Status: DC
  Administered 2015-06-14 – 2015-06-16 (×6): 600 mg via ORAL
  Filled 2015-06-14 (×6): qty 2

## 2015-06-14 MED ORDER — FERROUS SULFATE 325 (65 FE) MG PO TABS
325.0000 mg | ORAL_TABLET | Freq: Three times a day (TID) | ORAL | Status: DC
  Administered 2015-06-14 – 2015-06-16 (×5): 325 mg via ORAL
  Filled 2015-06-14 (×5): qty 1

## 2015-06-14 MED ORDER — SILDENAFIL CITRATE 20 MG PO TABS
40.0000 mg | ORAL_TABLET | Freq: Three times a day (TID) | ORAL | Status: DC
  Administered 2015-06-14 – 2015-06-24 (×29): 40 mg via ORAL
  Filled 2015-06-14 (×29): qty 2

## 2015-06-14 MED ORDER — CYCLOBENZAPRINE HCL 10 MG PO TABS: 10.0000 mg | ORAL_TABLET | Freq: Three times a day (TID) | ORAL | Status: DC | PRN

## 2015-06-14 MED ORDER — HYDRALAZINE HCL 25 MG PO TABS
12.5000 mg | ORAL_TABLET | Freq: Three times a day (TID) | ORAL | Status: DC
  Administered 2015-06-14 – 2015-06-16 (×5): 12.5 mg via ORAL
  Filled 2015-06-14 (×6): qty 1

## 2015-06-14 MED ORDER — INSULIN DETEMIR 100 UNIT/ML ~~LOC~~ SOLN
40.0000 [IU] | Freq: Two times a day (BID) | SUBCUTANEOUS | Status: DC
  Filled 2015-06-14 (×4): qty 0.4

## 2015-06-14 NOTE — Patient Outreach (Signed)
[facility]) Care Management  07/05/15  [name] 1966/06/10 [id number]   Noted [name]'s presentation to [facility] ED via EMS with complaint of N/V/D x 1 week. She was bradycardic on presentation.   [name] is followed closely by Dr. [name] and Tonye Becket NP in the Advanced Heart Failure Clinic. She is followed by [facility] Care Management but has missed her last 2 appointments with [place]. I will continue to follow [name]'s progress and will try to make a follow up appointment with her.    Marja Kays MHA,BSN,RN,CCM [facility] Care Management  [phone]

## 2015-06-14 NOTE — ED Notes (Signed)
Attempted report to 2H 

## 2015-06-14 NOTE — ED Provider Notes (Signed)
CSN: [id number]     Arrival date & time 2015/06/24  1551 History   First MD Initiated Contact with Patient 06/24/15 1603     Chief Complaint  Patient presents with  . Bradycardia  . Vomiting     (Consider location/radiation/quality/duration/timing/severity/associated sxs/prior Treatment) HPI Comments: Bradycardic with EMS in the 40s.  Patient is a 49 y.o. female presenting with vomiting. The history is provided by the patient.  Emesis Severity:  Moderate Duration:  1 week Timing:  Intermittent Quality:  Stomach contents Progression:  Unchanged Chronicity:  New Recent urination:  Normal Relieved by:  Nothing Worsened by:  Nothing tried Associated symptoms: diarrhea   Associated symptoms: no abdominal pain and no chills     Past Medical History  Diagnosis Date  . CHF (congestive heart failure) (HCC)     a. EF 30-35%, RV mildly dilated (difficult study 11/2013) b. RHC (12/2013): RA 38/37 (35), RV 90/28, PA 95/51 (71), PCWP 54, PA 40%, AO 96 %, CO/CI Fick 3.2/1.4, PVR 5.3   . Nonischemic cardiomyopathy (HCC) 12/2013    a. LHC (12/2013) normal coronary anatomy   . Mitral regurgitation     moderate to severe  . LBBB (left bundle branch block)   . HTN (hypertension)   . Asthma   . IBS (irritable bowel syndrome)     with primarily constipation  . Morbid obesity (HCC)   . GERD (gastroesophageal reflux disease)   . IDA (iron deficiency anemia)     2o TO SB AVMS, Hx parenteral iron Dr Mariel Sleet  . AVM (arteriovenous malformation)     Small bowel, s/p duble balloon enteroscopy/APC jejunum Dr Gwinda Passe [facility]) 01/23/2011  . Peripheral neuropathy (HCC)   . OSA (obstructive sleep apnea)     on CPAP qhs  . Diabetes mellitus without complication (HCC)   . Torsades de pointes (HCC)     a. appropriate ICD therapy 12/2014 in setting of hypokalemia  . Pneumonia    Past Surgical History  Procedure Laterality Date  . Appendectomy    . Cholecystectomy      biliary dyskinesia  . Mastectomy   2003    left, partial  . Knee arthroscopy  2005    left  . Small bowel enteroscopy  MAY 2012 [facility] DR. GILLIAM    SB AVMS s/p APC  . Colonoscopy  OCT 2010/SEP 2011    tortuous colon, 3 polyps-benign(2010),   . Upper gastrointestinal endoscopy  '08, '10, SEP 2011    mild antral gastritis (2010), negative SB bx (2010), incomlpete Schatzki's ring (2011)  . Small bowel enteroscopy  SEP 2011 [facility]    Fields-NO AVMS  . Givens capsule study  NOV 2010     NO AVMS, normal  . Irrigation and debridement abscess  06/25/2012    Procedure: MINOR INCISION AND DRAINAGE OF ABSCESS;  Surgeon: [name], MD;  Location: [facility] ORS;  Service: General;  Laterality: [name];  Incision & Drainage of Infected Sebaceous Cyst on Chest  . Breast surgery    . Implantable cardioverter defibrillator generator change Left 02/15/2012    BSX CRTD   . Left and right heart catheterization with coronary/graft angiogram  12/12/2013    Procedure: LEFT AND RIGHT HEART CATHETERIZATION WITH Isabel Caprice;  Surgeon: Peter M Swaziland, MD;  Location: [facility] CATH LAB;  Service: Cardiovascular;;  . Right heart catheterization [name] 01/06/2015    Procedure: RIGHT HEART CATH;  Surgeon: Laurey Morale, MD;  Location: [facility] CATH LAB;  Service: Cardiovascular;  Laterality: [name];  . Esophagogastroduodenoscopy  Sept 2013    [facility]: diminutive duodenal polyp, polyp in gastric cardia, path with benign duodenal mucosa with focally prominent Brunner's glands. Fundic gland polyps.   . Cardiac catheterization [name] 03/31/2015    Procedure: Right Heart Cath;  Surgeon: Dolores Patty, MD;  Location: [facility] INVASIVE CV LAB;  Service: Cardiovascular;  Laterality: [name];  . Cardiac catheterization [name] 04/23/2015    Procedure: Right Heart Cath;  Surgeon: Dolores Patty, MD;  Location: [facility] INVASIVE CV LAB;  Service: Cardiovascular;  Laterality: [name];   Family History  Problem Relation Age of Onset  . Colon cancer Paternal Uncle   . Cervical cancer  Mother   . Hypertension Mother   . Cancer Mother   . Heart disease Father   . Hypertension Father   . GER disease Father   . Heart attack Father   . Bleeding Disorder Father   . Diabetes Sister   . Heart disease Sister   . Hypertension Sister    Social History  Substance Use Topics  . Smoking status: Former Smoker -- 0.00 packs/day for 0 years    Types: Cigarettes  . Smokeless tobacco: Former Neurosurgeon     Comment: Quit x 4 years  . Alcohol Use: No   OB History    Gravida Para Term Preterm AB TAB SAB Ectopic Multiple Living   6 1 1  5  3 2        Review of Systems  Constitutional: Negative for fever and chills.  Respiratory: Negative for cough and shortness of breath.   Cardiovascular: Positive for leg swelling. Negative for chest pain.  Gastrointestinal: Positive for vomiting and diarrhea. Negative for abdominal pain.  Neurological: Positive for weakness.  All other systems reviewed and are negative.     Allergies  Cymbalta; Trazodone and nefazodone; and Lyrica  Home Medications   Prior to Admission medications   Medication Sig Start Date End Date Taking? Authorizing Provider  albuterol (PROVENTIL HFA;VENTOLIN HFA) 108 (90 BASE) MCG/ACT inhaler Inhale 2 puffs into the lungs every 6 (six) hours as needed for wheezing or shortness of breath.    Historical Provider, MD  allopurinol (ZYLOPRIM) 300 MG tablet Take 1 tablet (300 mg total) by mouth daily. 08/14/14   [name] D Filbert Schilder, NP  amiodarone (PACERONE) 200 MG tablet Take 200 mg by mouth 2 (two) times daily. 04/08/15   Historical Provider, MD  aspirin 81 MG tablet Take 1 tablet (81 mg total) by mouth daily. 06/04/15   Laurey Morale, MD  cyclobenzaprine (FLEXERIL) 10 MG tablet Take 10 mg by mouth 3 (three) times daily as needed for muscle spasms.     Historical Provider, MD  docusate sodium (COLACE) 100 MG capsule Take 1 capsule (100 mg total) by mouth 2 (two) times daily as needed for mild constipation. Patient taking differently:  Take 100 mg by mouth 2 (two) times daily.  04/28/15   [name] D Clegg, NP  DULoxetine (CYMBALTA) 30 MG capsule Take 30 mg by mouth daily. 02/24/15   Historical Provider, MD  ferrous sulfate 325 (65 FE) MG tablet Take 325 mg by mouth 3 (three) times daily.    Historical Provider, MD  gabapentin (NEURONTIN) 300 MG capsule Take 600 mg by mouth 4 (four) times daily.    Historical Provider, MD  hydrALAZINE (APRESOLINE) 25 MG tablet Take 0.5 tablets (12.5 mg total) by mouth every 8 (eight) hours. 04/28/15   [name] D Filbert Schilder, NP  insulin aspart (NOVOLOG) 100  UNIT/ML injection Inject 0-20 Units into the skin 3 (three) times daily with meals. Patient taking differently: Inject 0-20 Units into the skin 3 (three) times daily with meals. Per sliding scale: 120 - 150 = 2 units, 151 - 200 = 4 units, 201 - 250 = 7 units, 251 - 300 = 11 units, 301 - 350 = 15 units, 351 and above = 20 units 06/28/12   Gareth Morgan, MD  insulin detemir (LEVEMIR) 100 UNIT/ML injection Inject 0.58 mLs (58 Units total) into the skin 2 (two) times daily. 04/08/15   [name] D Filbert Schilder, NP  Linaclotide (LINZESS) 290 MCG CAPS capsule Take 1 capsule (290 mcg total) by mouth daily. 30 minutes before breakfast 02/12/15   Nira Retort, NP  magnesium oxide (MAG-OX) 400 (241.3 MG) MG tablet Take 1 tablet (400 mg total) by mouth 2 (two) times daily. 04/15/15   [name] D Filbert Schilder, NP  metolazone (ZAROXOLYN) 2.5 MG tablet Take 1 tablet (2.5 mg total) by mouth every other day. 05/27/15   Laurey Morale, MD  milrinone [facility]) 20 MG/100ML SOLN infusion Inject 47.5125 mcg/min into the vein continuous. 04/28/15   [name] D Filbert Schilder, NP  omeprazole (PRILOSEC) 40 MG capsule Take 1 capsule (40 mg total) by mouth daily. 03/27/13   Marinus Maw, MD  oxyCODONE-acetaminophen (PERCOCET) 10-325 MG per tablet Take 0.5-1 tablets by mouth every 4 (four) hours as needed for pain.     Historical Provider, MD  potassium chloride SA (K-DUR,KLOR-CON) 20 MEQ tablet Take 4 tables (80 meq in am) take 2 tablets  (40 meq) at lunch and take 4 tablets (80 meq) at bedtime 06/10/15   Laurey Morale, MD  ranolazine (RANEXA) 500 MG 12 hr tablet Take 1 tablet (500 mg total) by mouth 2 (two) times daily. 04/08/15   [name] D Filbert Schilder, NP  sildenafil (REVATIO) 20 MG tablet Take 2 tablets (40 mg total) by mouth 3 (three) times daily. 05/27/15   Laurey Morale, MD  spironolactone (ALDACTONE) 25 MG tablet TAKE 1 TABLET BY MOUTH TWICE DAILY. 05/14/15   [name] D Clegg, NP  temazepam (RESTORIL) 30 MG capsule Take 30 mg by mouth at bedtime.    Historical Provider, MD  torsemide (DEMADEX) 100 MG tablet Take 1 tablet (100 mg total) by mouth 2 (two) times daily. 04/15/15   [name] D Filbert Schilder, NP  vitamin E 400 UNIT capsule Take 400 Units by mouth daily.    Historical Provider, MD   BP 115/72 mmHg  Pulse 80  Temp(Src) 98 F (36.7 C) (Oral)  Resp 18  SpO2 96%  LMP 01/19/2014 Physical Exam  Constitutional: She is oriented to person, place, and time. She appears well-developed and well-nourished. No distress.  HENT:  Head: Normocephalic and atraumatic.  Mouth/Throat: Oropharynx is clear and moist.  Eyes: EOM are normal. Pupils are equal, round, and reactive to light.  Neck: Normal range of motion. Neck supple.  Cardiovascular: Normal rate and regular rhythm.  Exam reveals no friction rub.   No murmur heard. Pulmonary/Chest: Effort normal and breath sounds normal. No respiratory distress. She has no wheezes. She has no rales.  Abdominal: Soft. She exhibits no distension. There is no tenderness. There is no rebound.  Musculoskeletal: Normal range of motion. She exhibits no edema.  Neurological: She is alert and oriented to person, place, and time. No cranial nerve deficit. She exhibits normal muscle tone. Coordination normal.  Skin: No rash noted. She is not diaphoretic.  Nursing note and vitals reviewed.  ED Course  Procedures (including critical care time) Labs Review Labs Reviewed - No data to display  Imaging Review Ct Abdomen  Pelvis Wo Contrast  06/13/2015   CLINICAL DATA:  Uncontrolled vomiting for 5 days  EXAM: CT ABDOMEN AND PELVIS WITHOUT CONTRAST  TECHNIQUE: Multidetector CT imaging of the abdomen and pelvis was performed following the standard protocol without IV contrast.  COMPARISON:  01/20/2015  FINDINGS: Subsegmental atelectasis at the left lung base. Severe cardiomegaly. AICD device.  Postcholecystectomy  The left lobe of the liver is prominent but there is no evidence of nodularity of the contour of the liver.  Spleen, pancreas, adrenal glands, and kidneys are within normal limits.  There is significant stranding throughout the subcutaneous fat across the abdomen.  Bladder and uterus are within normal limits. There is a 1.9 cm hyperdense area within the left adnexa of unknown significance. It may represent an ovarian complex cyst.  There is a small amount of free fluid layering in the pelvis. There is no obvious retroperitoneal adenopathy.  IMPRESSION: Small amount of free fluid in the pelvis is nonspecific.  1.9 cm hyperdense abnormality in the left adnexa may represent a lesion in the ovary. Pelvic ultrasound is warranted.  There is edema within the subcutaneous fat across the abdomen of unknown significance.  Left basilar subsegmental atelectasis.   Electronically Signed   By: Jolaine Click M.D.   On: 06/17/2015 19:02   Dg Chest Portable 1 View  07/09/2015   CLINICAL DATA:  Bradycardia, CHF, chronic milrinone infusion, nausea and vomiting  EXAM: PORTABLE CHEST 1 VIEW  COMPARISON:  05/26/2015  FINDINGS: Right PICC line tip remains at the right innominate vein as before. Left subclavian multilead defibrillator/pacer evident. Heart is enlarged with vascular and interstitial prominence suggesting mild edema. Limited exam because of body habitus. Increased left base density suggest chronic atelectasis when compared to the prior CT.  IMPRESSION: Cardiomegaly with increased vascular and interstitial prominence suggesting mild  developing edema pattern compare to 05/26/2015.   Electronically Signed   By: Judie Petit.  Shick M.D.   On: 06/21/2015 17:25   I have personally reviewed and evaluated these images and lab results as part of my medical decision-making.   EKG Interpretation None     ED ECG REPORT   Date: 06/18/2015  Rate: 80  Rhythm: paced  QRS Axis: normal  Intervals: No PR interval since she's paced, widened QRS c/w ventricular pacing  ST/T Wave abnormalities: normal  Conduction Disutrbances:none  Narrative Interpretation:   Old EKG Reviewed: unchanged  I have personally reviewed the EKG tracing and agree with the computerized printout as noted.   MDM   Final diagnoses:  Acute on chronic congestive heart failure, unspecified congestive heart failure type [facility])  Bradycardia    49 year old female here with nausea and vomiting. Patient has felt this way for a week. She denies any abdominal pain or chest pain or shortness of breath. She has chronic heart failure and is on a constant milrinone drips of a right arm PICC line. With EMS she had heart rates in the 40s. Here her heart is changing rhythm. Her EKG shows a paced rate at 80, however when I am in the room when hooked up to the monitor she appears to be in bigeminy with a paced beat followed by a PVC. When looking at the oxygen sat her up, she is effectively only seeing every beat is a PVC and not seeing the paced beat. This is intermittent having from time  to time. There is no alleviating or exacerbating factors. She denies any fever. Lungs with some mild bibasilar Rales and she's got 1+ edema bilaterally in her legs. With her intermittent bradycardia, I'm concerned about something is wrong with her fibrillator. His [employer] and we will interrogated. Cardiology is consulted. Cardiology is admitting.    Elwin Mocha, MD 06/15/15 [phone]

## 2015-06-14 NOTE — ED Notes (Signed)
Patient transported to CT 

## 2015-06-14 NOTE — ED Notes (Signed)
MD at bedside. 

## 2015-06-14 NOTE — ED Notes (Addendum)
Pt arrives via [facility] from her pcp for c/o n/v/d x 1 week. Patient was bradycardic with EMS with heart rate of 39. Pts heart rate currently 80. Pt has picc line in right upper arm with an infusion of milrinone for her heart. Pt alert and oriented

## 2015-06-14 NOTE — ED Notes (Signed)
Dr. Gwendolyn Grant made aware that patient already had I stat troponin and that a duplicate i stat troponin was ordered, Dr. Gwendolyn Grant advised that second i stat troponin can be changed to a regular troponin.

## 2015-06-14 NOTE — H&P (Signed)
Chief Complaint:  Cardiogenic shock, acute heart failure  Cardiologist Aundra Dubin  HPI:  This is a 49 y.o. 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 She is on chronic home inotropic infusion (milrinone 0.375 g/kilogram/minute) and has been deemed to be a poor candidate for either cardiac transplantation or LVAD implantation, at least in part due to poor social support system. She already has a CRT-D system implanted [employer] investment [employer]).  She has had intractable nausea and vomiting for roughly a week now as well as frequent watery stools (about twice a day, not true diarrhea, no active bleeding). She thinks it is very likely that she has thrown up many of her medications. The vomiting is a more serious problem to her than the runny stools. Despite poor by mouth intake she is roughly 15 pounds above her "dry weight" 263-2 and 64 pounds. She has developed substantial lower extremity edema and abdominal distention and has frank orthopnea. In addition she complains of uncontrollable myoclonus of her upper extremities and occasionally her feet. She has not had defibrillator discharges that she is aware of.  Just this year she has had 2 separate hospitalizations for ventricular arrhythmia storm, April and July. She is currently receiving amiodarone and ranolazine. Recently digoxin and Corlanor were stopped for side effects.  Additional comorbidities include morbid obesity, obstructive sleep apnea on CPAP, type 2 diabetes mellitus on insulin, essential hypertension, chronic renal insufficiency, at least in part related to reduced cardiac output. Recently her creatinine has been around 2.0, although last year her creatinine was normal.  Most recent  echocardiogram shows left ventricular ejection fraction 30-35 percent, stable over the last 12-18 months. The left ventricle severely dilated and there is evidence of restrictive diastolic filling.  PMHx:  Past Medical History  Diagnosis Date  . CHF (congestive heart failure) (HCC)     a. EF 30-35%, RV mildly dilated (difficult study 11/2013) b. RHC (12/2013): RA 38/37 (35), RV 90/28, PA 95/51 (71), PCWP 54, PA 40%, AO 96 %, CO/CI Fick 3.2/1.4, PVR 5.3   . Nonischemic cardiomyopathy ([place]) 12/2013    a. LHC (12/2013) normal coronary anatomy   . Mitral regurgitation     moderate to severe  . LBBB (left bundle branch block)   . HTN (hypertension)   . Asthma   . IBS (irritable bowel syndrome)     with primarily constipation  . Morbid obesity ([place])   . GERD (gastroesophageal reflux disease)   . IDA (iron deficiency anemia)     2o TO SB AVMS, Hx parenteral iron Dr Tressie Stalker  . AVM (arteriovenous malformation)     Small bowel, s/p duble balloon enteroscopy/APC jejunum Dr Arsenio Loader [facility]) 01/23/2011  . Peripheral neuropathy ([place])   . OSA (obstructive sleep apnea)     on CPAP qhs  . Diabetes mellitus without complication ([place])   . Torsades de pointes ([place])     a. appropriate ICD therapy 12/2014 in setting of hypokalemia  . Pneumonia     Past Surgical History  Procedure Laterality Date  . Appendectomy    . Cholecystectomy      biliary dyskinesia  . Mastectomy  2003    left, partial  . Knee arthroscopy  2005    left  . Small bowel enteroscopy  MAY 2012 [facility] DR. Arsenio Loader  SB AVMS s/p APC  . Colonoscopy  OCT 2010/SEP 2011    tortuous colon, 3 polyps-benign(2010),   . Upper gastrointestinal endoscopy  '08, '10, SEP 2011    mild antral gastritis (2010), negative SB bx (2010), incomlpete Schatzki's ring (2011)  . Small bowel enteroscopy  SEP 2011 [facility]    Fields-NO AVMS  . Givens capsule study  NOV 2010     NO AVMS, normal  . Irrigation and debridement abscess  06/25/2012     Procedure: MINOR INCISION AND DRAINAGE OF ABSCESS;  Surgeon: Scherry Ran, MD;  Location: [facility] ORS;  Service: General;  Laterality: [name];  Incision & Drainage of Infected Sebaceous Cyst on Chest  . Breast surgery    . Implantable cardioverter defibrillator generator change Left 02/15/2012    BSX CRTD   . Left and right heart catheterization with coronary/graft angiogram  12/12/2013    Procedure: LEFT AND RIGHT HEART CATHETERIZATION WITH Beatrix Fetters;  Surgeon: Peter M Martinique, MD;  Location: [facility] C[facility] LAB;  Service: Cardiovascular;;  . Right heart catheterization [name] 01/06/2015    Procedure: RIGHT HEART CATH;  Surgeon: Larey Dresser, MD;  Location: [facility] CATH LAB;  Service: Cardiovascular;  Laterality: [name];  . Esophagogastroduodenoscopy  Sept 2013    [facility]: diminutive duodenal polyp, polyp in gastric cardia, path with benign duodenal mucosa with focally prominent Brunner's glands. Fundic gland polyps.   . Cardiac catheterization [name] 03/31/2015    Procedure: Right Heart Cath;  Surgeon: Jolaine Artist, MD;  Location: [place] CV LAB;  Service: Cardiovascular;  Laterality: [name];  . Cardiac catheterization [name] 04/23/2015    Procedure: Right Heart Cath;  Surgeon: Jolaine Artist, MD;  Location: [place] CV LAB;  Service: Cardiovascular;  Laterality: [name];    FAMHx:  Family History  Problem Relation Age of Onset  . Colon cancer Paternal Uncle   . Cervical cancer Mother   . Hypertension Mother   . Cancer Mother   . Heart disease Father   . Hypertension Father   . GER disease Father   . Heart attack Father   . Bleeding Disorder Father   . Diabetes Sister   . Heart disease Sister   . Hypertension Sister     SOCHx:   reports that she has quit smoking. Her smoking use included Cigarettes. She smoked 0.00 packs per day for 0 years. She has quit using smokeless tobacco. She reports that she does not drink alcohol or use illicit drugs.   Lives alone with her 29 year old  daughter. She is aware of her limited life span and wants her daughter to be raised by a friend and is planning to drop the court papers for this.  ALLERGIES:  Allergies  Allergen Reactions  . Cymbalta [Duloxetine Hcl] Other (See Comments)    Only in a mild dose pt can tolerate  . Trazodone And Nefazodone Other (See Comments)    Nightmares   . Lyrica [Pregabalin] Swelling    ROS: Pertinent items noted in HPI and remainder of comprehensive ROS otherwise negative.  HOME MEDS:  (Not in a hospital admission)  LABS/IMAGING: Results for orders placed or performed during the hospital encounter of 07/03/2015 (from the past 48 hour(s))  CBC     Status: Abnormal   Collection Time: 07/02/2015  4:53 PM  Result Value Ref Range   WBC 5.8 4.0 - 10.5 K/uL   RBC 4.35 3.87 - 5.11 MIL/uL   Hemoglobin 13.0 12.0 - 15.0 g/dL   HCT  40.9 36.0 - 46.0 %   MCV 94.0 78.0 - 100.0 fL   MCH 29.9 26.0 - 34.0 pg   MCHC 31.8 30.0 - 36.0 g/dL   RDW 16.8 (H) 11.5 - 15.5 %   Platelets 294 150 - 400 K/uL  I-stat troponin, ED     Status: None   Collection Time: 06/27/2015  4:53 PM  Result Value Ref Range   Troponin i, poc 0.00 0.00 - 0.08 ng/mL   Comment 3            Comment: Due to the release kinetics of cTnI, a negative result within the first hours of the onset of symptoms does not rule out myocardial infarction with certainty. If myocardial infarction is still suspected, repeat the test at appropriate intervals.   Brain natriuretic peptide     Status: Abnormal   Collection Time: 06/16/2015  4:53 PM  Result Value Ref Range   B Natriuretic Peptide 388.7 (H) 0.0 - 100.0 pg/mL  Comprehensive metabolic panel     Status: Abnormal   Collection Time: 06/28/2015  4:53 PM  Result Value Ref Range   Sodium 130 (L) 135 - 145 mmol/L   Potassium 4.9 3.5 - 5.1 mmol/L   Chloride 87 (L) 101 - 111 mmol/L   CO2 29 22 - 32 mmol/L   Glucose, Bld 69 65 - 99 mg/dL   BUN 86 (H) 6 - 20 mg/dL   Creatinine, Ser 2.23 (H) 0.44 -  1.00 mg/dL   Calcium 8.7 (L) 8.9 - 10.3 mg/dL   Total Protein 8.3 (H) 6.5 - 8.1 g/dL   Albumin 3.6 3.5 - 5.0 g/dL   AST 80 (H) 15 - 41 U/L   ALT 42 14 - 54 U/L   Alkaline Phosphatase 121 38 - 126 U/L   Total Bilirubin 2.4 (H) 0.3 - 1.2 mg/dL   GFR calc non Af Amer 25 (L) >60 mL/min   GFR calc Af Amer 29 (L) >60 mL/min    Comment: (NOTE) The eGFR has been calculated using the CKD EPI equation. This calculation has not been validated in all clinical situations. eGFR's persistently <60 mL/min signify possible Chronic Kidney Disease.    Anion gap 14 5 - 15  Lipase, blood     Status: None   Collection Time: 06/23/2015  4:53 PM  Result Value Ref Range   Lipase 35 22 - 51 U/L   Dg Chest Portable 1 View  06/17/2015   CLINICAL DATA:  Bradycardia, CHF, chronic milrinone infusion, nausea and vomiting  EXAM: PORTABLE CHEST 1 VIEW  COMPARISON:  05/26/2015  FINDINGS: Right PICC line tip remains at the right innominate vein as before. Left subclavian multilead defibrillator/pacer evident. Heart is enlarged with vascular and interstitial prominence suggesting mild edema. Limited exam because of body habitus. Increased left base density suggest chronic atelectasis when compared to the prior CT.  IMPRESSION: Cardiomegaly with increased vascular and interstitial prominence suggesting mild developing edema pattern compare to 05/26/2015.   Electronically Signed   By: Jerilynn Mages.  Shick M.D.   On: 07/04/2015 17:25    VITALS: Blood pressure 108/90, pulse 42, temperature 98 F (36.7 C), temperature source Oral, resp. rate 20, height 5' 5" (1.651 m), weight 273 lb (123.832 kg), last menstrual period 01/19/2014, SpO2 90 %.  EXAM:  General: Alert, oriented x3, no distress physical exam is seriously impaired by morbid obesity Head: no evidence of trauma, PERRL, EOMI, no exophtalmos or lid lag, no myxedema, no xanthelasma; normal ears, nose and  oropharynx Neck: Very difficult to evaluate the jugular venous pulsations and  hepatojugular reflux; brisk carotid pulses without delay and no carotid bruits Chest: Diminished in bases with moist rales in the bases bilaterally,  Cardiovascular: Marked inferolateral displacement of the apical impulse, regular rhythm, normal first heart sound and split second heart sounds, no rubs, S3 gallop, faint apical holosystolic murmur Abdomen: Mild right upper quadrant tenderness, moderate distention,normal bowel sounds Extremities: no clubbing, cyanosis; 2-3 plus symmetrical pitting edema, halfway to the knees bilaterally; 2+ radial, ulnar and brachial pulses bilaterally; 2+ right femoral, posterior tibial and dorsalis pedis pulses; 2+ left femoral, posterior tibial and dorsalis pedis pulses; no subclavian or femoral bruits Neurological: grossly nonfocal  ECG Atrial sensed, ventricular paced rhythm, positive R wave in lead V1 consistent with biventricular pacing. In fact separate LV-RV pacing spikes are seen  Telemetry Very frequent and very premature ventricular ectopy, polymorphic, occasional couplets; no evidence of VT during her evaluation in the emergency room.  Note is made of absent perfusion pulse during many of the PVCs, by pulse oximetry Device interrogation pending.  IMPRESSION/PLAN: 1. Acute on chronic combined systolic and diastolic heart failure, stage D Prognosis is extremely poor, especially since there are no advanced therapies available to her. Unclear whether the increased ectopy is related to milrinone and whether the inotropic benefit is diminished by the frequency of ectopy. She appears to be roughly 15 pounds above her dry weight and has vomiting: furosemide drip. Ace inhibitors are contraindicated due to worsening renal function, beta blockers are contraindicated due to acute decompensation; both have been poorly tolerated in the past. Severe pulmonary artery hypertension and moderate to severe mitral insufficiency are both poor prognostic indicators.  2.  Severe nonischemic cardiomyopathy and left bundle branch block  3. CRT-D device [facility]) appears to have normal function. Last device check August 23 showed a success rate of biventricular pacing of only 87%, likely due to the frequent premature beats. Last shock July 23, last ATP July 24. Plan to perform device testing today. Suspect that the "bradycardia" that she experienced while taking digoxin and corlanor was actually due to very frequent non-perfusing PVCs.   4. Acute on chronic renal insufficiency appears to be an expression of cardiogenic shock, rather than due to nausea and vomiting.  5. History of repeated episodes of ventricular tachycardia storm. I suspect that the amiodarone is responsible for her myoclonic jerks, but I don't think it can be safely stopped without precipitating more malignant arrhythmia. Dr. Carmelina Noun last note also mentions abnormal thyroid function tests are also likely due to amiodarone. I'm not sure she has had the endocrinology consultation that he was planning.  6. Nausea and vomiting may represent a sign of poor abdominal perfusion or an intercurrent illness.  7. Social stressors. She has poor social support network. She is worried that if she dies the wrong people who have custody of her daughter. She tells me that she prefers a friend has a guardian to raise her daughter and was planning to set that up this coming Thursday. Will ask social worker to assist.    Sanda Klein, MD, [facility] [phone] office [phone] pager  06/13/2015, 5:47 PM

## 2015-06-14 NOTE — Progress Notes (Signed)
Utilization Review Completed.  

## 2015-06-15 DIAGNOSIS — R112 Nausea with vomiting, unspecified: Secondary | ICD-10-CM

## 2015-06-15 LAB — CARBOXYHEMOGLOBIN
Carboxyhemoglobin: 2.2 % — ABNORMAL HIGH (ref 0.5–1.5)
METHEMOGLOBIN: 0.6 % (ref 0.0–1.5)
O2 Saturation: 77.9 %
Total hemoglobin: 11.9 g/dL — ABNORMAL LOW (ref 12.0–16.0)

## 2015-06-15 LAB — GLUCOSE, CAPILLARY
GLUCOSE-CAPILLARY: 59 mg/dL — AB (ref 65–99)
GLUCOSE-CAPILLARY: 90 mg/dL (ref 65–99)
GLUCOSE-CAPILLARY: 91 mg/dL (ref 65–99)
Glucose-Capillary: 71 mg/dL (ref 65–99)
Glucose-Capillary: 92 mg/dL (ref 65–99)
Glucose-Capillary: 111 mg/dL — ABNORMAL HIGH (ref 65–99)

## 2015-06-15 LAB — MAGNESIUM: MAGNESIUM: 2.2 mg/dL (ref 1.7–2.4)

## 2015-06-15 LAB — BASIC METABOLIC PANEL
Anion gap: 10 (ref 5–15)
BUN: 85 mg/dL — AB (ref 6–20)
CHLORIDE: 88 mmol/L — AB (ref 101–111)
CO2: 36 mmol/L — AB (ref 22–32)
CREATININE: 2.32 mg/dL — AB (ref 0.44–1.00)
Calcium: 8.8 mg/dL — ABNORMAL LOW (ref 8.9–10.3)
GFR calc Af Amer: 27 mL/min — ABNORMAL LOW (ref >60)
GFR calc non Af Amer: 24 mL/min — ABNORMAL LOW (ref >60)
GLUCOSE: 55 mg/dL — AB (ref 65–99)
POTASSIUM: 3.9 mmol/L (ref 3.5–5.1)
Sodium: 134 mmol/L — ABNORMAL LOW (ref 135–145)

## 2015-06-15 MED ORDER — INFLUENZA VAC SPLIT QUAD 0.5 ML IM SUSY
0.5000 mL | PREFILLED_SYRINGE | INTRAMUSCULAR | Status: DC
  Filled 2015-06-15 (×2): qty 0.5

## 2015-06-15 MED ORDER — METOLAZONE 2.5 MG PO TABS
2.5000 mg | ORAL_TABLET | Freq: Once | ORAL | Status: AC
  Administered 2015-06-15: 2.5 mg via ORAL
  Filled 2015-06-15: qty 1

## 2015-06-15 MED ORDER — ONDANSETRON HCL 4 MG/2ML IJ SOLN
4.0000 mg | Freq: Four times a day (QID) | INTRAMUSCULAR | Status: DC | PRN
  Administered 2015-06-15 – 2015-06-24 (×6): 4 mg via INTRAVENOUS
  Filled 2015-06-15 (×6): qty 2

## 2015-06-15 NOTE — Care Management Note (Signed)
Case Management Note  Patient Details  Name: [name] MRN: [id number] Date of Birth: 1965/10/03  Subjective/Objective:     Adm w heart failure               Action/Plan: lives w fam, pcp dr Katharine Look, act w rn from [facility]    Expected Discharge Date:                  Expected Discharge Plan:  Home w Home Health Services  In-House Referral:     Discharge planning Services  CM Consult  Post Acute Care Choice:    Choice offered to:     DME Arranged:    DME Agency:     HH Arranged:  Disease Management HH Agency:  [facility], quantity  Status of Service:     Medicare Important Message Given:    Date Medicare IM Given:    Medicare IM give by:    Date Additional Medicare IM Given:    Additional Medicare Important Message give by:     If discussed at Long Length of Stay Meetings, dates discussed:    Additional Comments: will follow for addit needs as pt progresses.  Hanley Hays, RN 06/15/2015, 10:10 AM

## 2015-06-15 NOTE — Progress Notes (Addendum)
Pt blood sugar 59 this morning. 4oz juice given to pt. Blood sugar rechecked and now 90. Physician made aware and orders received to hold morning insulin.

## 2015-06-15 NOTE — Progress Notes (Addendum)
Patient ID: [name], female   DOB: [birth year], 49 y.o.   MRN: [id number]   SUBJECTIVE: Patient was admitted with nausea, vomiting, and weight gain. Pulmonary edema on CXR.    This morning she remains nauseated.  UOP not all measured overnight.   Scheduled Meds: . allopurinol  300 mg Oral Daily  . amiodarone  200 mg Oral BID  . aspirin EC  81 mg Oral Daily  . ferrous sulfate  325 mg Oral TID  . gabapentin  600 mg Oral QID  . heparin subcutaneous  5,000 Units Subcutaneous 3 times per day  . hydrALAZINE  12.5 mg Oral 3 times per day  . insulin aspart  0-15 Units Subcutaneous TID WC  . insulin detemir  40 Units Subcutaneous BID  . magnesium oxide  400 mg Oral BID  . metolazone  2.5 mg Oral Once  . pantoprazole  40 mg Oral Daily  . potassium chloride SA  60 mEq Oral TID  . ranolazine  500 mg Oral BID  . sildenafil  40 mg Oral TID  . spironolactone  25 mg Oral BID  . temazepam  30 mg Oral QHS   Continuous Infusions: . furosemide (LASIX) infusion 10 mg/hr (06/18/2015 1952)  . milrinone 0.375 mcg/kg/min (06/15/15 0732)   PRN Meds:.albuterol, cyclobenzaprine, oxyCODONE-acetaminophen **AND** oxyCODONE    Filed Vitals:   06/15/15 0300 06/15/15 0400 06/15/15 0452 06/15/15 0510  BP: 95/74 104/75  120/69  Pulse:      Temp:  98.1 F (36.7 C)    TempSrc:  Oral    Resp: Height:      Weight:   278 lb 14.1 oz (126.5 kg)   SpO2: 91% 95%  94%    Intake/Output Summary (Last 24 hours) at 06/15/15 0744 Last data filed at 06/15/15 0732  Gross per 24 hour  Intake 437.64 ml  Output   1050 ml  Net -612.36 ml    LABS: Basic Metabolic Panel:  Recent Labs  78/29/56 1653 06/15/15 0418  NA 130* 134*  K 4.9 3.9  CL 87* 88*  CO2 29 36*  GLUCOSE 69 55*  BUN 86* 85*  CREATININE 2.23* 2.32*  CALCIUM 8.7* 8.8*  MG  --  2.2   Liver Function Tests:  Recent Labs  06/24/2015 1653  AST 80*  ALT 42  ALKPHOS 121  BILITOT 2.4*  PROT 8.3*  ALBUMIN 3.6    Recent  Labs  06/13/2015 1653  LIPASE 35   CBC:  Recent Labs  07/10/2015 1653  WBC 5.8  HGB 13.0  HCT 40.9  MCV 94.0  PLT 294   Cardiac Enzymes:  Recent Labs  07/01/2015 1645  TROPONINI <0.03   BNP: Invalid input(s): POCBNP D-Dimer: No results for input(s): DDIMER in the last 72 hours. Hemoglobin A1C: No results for input(s): HGBA1C in the last 72 hours. Fasting Lipid Panel: No results for input(s): CHOL, HDL, LDLCALC, TRIG, CHOLHDL, LDLDIRECT in the last 72 hours. Thyroid Function Tests: No results for input(s): TSH, T4TOTAL, T3FREE, THYROIDAB in the last 72 hours.  Invalid input(s): FREET3 Anemia Panel: No results for input(s): VITAMINB12, FOLATE, FERRITIN, TIBC, IRON, RETICCTPCT in the last 72 hours.  RADIOLOGY: Ct Abdomen Pelvis Wo Contrast  07/04/2015   CLINICAL DATA:  Uncontrolled vomiting for 5 days  EXAM: CT ABDOMEN AND PELVIS WITHOUT CONTRAST  TECHNIQUE: Multidetector CT imaging of the abdomen and pelvis was performed following the standard protocol without IV contrast.  COMPARISON:  01/20/2015  FINDINGS: Subsegmental atelectasis at the left lung base. Severe cardiomegaly. AICD device.  Postcholecystectomy  The left lobe of the liver is prominent but there is no evidence of nodularity of the contour of the liver.  Spleen, pancreas, adrenal glands, and kidneys are within normal limits.  There is significant stranding throughout the subcutaneous fat across the abdomen.  Bladder and uterus are within normal limits. There is a 1.9 cm hyperdense area within the left adnexa of unknown significance. It may represent an ovarian complex cyst.  There is a small amount of free fluid layering in the pelvis. There is no obvious retroperitoneal adenopathy.  IMPRESSION: Small amount of free fluid in the pelvis is nonspecific.  1.9 cm hyperdense abnormality in the left adnexa may represent a lesion in the ovary. Pelvic ultrasound is warranted.  There is edema within the subcutaneous fat across the  abdomen of unknown significance.  Left basilar subsegmental atelectasis.   Electronically Signed   By: Jolaine Click M.D.   On: 2015/07/08 19:02   Dg Chest 2 View  05/16/2015   CLINICAL DATA:  Shortness of breath.  EXAM: CHEST  2 VIEW  COMPARISON:  04/15/2015.  FINDINGS: Stable enlarged cardiac silhouette and left subclavian pacer and AICD leads. Stable prominence of the pulmonary vasculature and interstitial markings with mild diffuse peribronchial thickening. Right PICC tip in the proximal superior vena cava. Thoracic spine degenerative changes. Cholecystectomy clips.  IMPRESSION: Stable cardiomegaly, pulmonary vascular congestion, chronic bronchitic changes and probable chronic interstitial lung disease without definite pulmonary edema.   Electronically Signed   By: Beckie Salts M.D.   On: 05/16/2015 13:05   Dg Chest Portable 1 View  Jul 08, 2015   CLINICAL DATA:  Bradycardia, CHF, chronic milrinone infusion, nausea and vomiting  EXAM: PORTABLE CHEST 1 VIEW  COMPARISON:  05/26/2015  FINDINGS: Right PICC line tip remains at the right innominate vein as before. Left subclavian multilead defibrillator/pacer evident. Heart is enlarged with vascular and interstitial prominence suggesting mild edema. Limited exam because of body habitus. Increased left base density suggest chronic atelectasis when compared to the prior CT.  IMPRESSION: Cardiomegaly with increased vascular and interstitial prominence suggesting mild developing edema pattern compare to 05/26/2015.   Electronically Signed   By: Judie Petit.  Shick M.D.   On: 2015/07/08 17:25   Dg Chest Port 1 View  05/26/2015   CLINICAL DATA:  Shortness of breath and syncope 1 day.  EXAM: PORTABLE CHEST - 1 VIEW  COMPARISON:  05/16/2015  FINDINGS: Right-sided PICC line has been pulled back slightly with tip overlying the region of the SVC near the junction of the subclavian vein. Left-sided pacemaker unchanged.  Lungs are hypoinflated with mild prominence of the perihilar  markings suggesting mild vascular congestion. There is mild stable cardiomegaly. Remainder of the exam is unchanged.  IMPRESSION: Stable mild cardiomegaly with evidence of mild vascular congestion.  Right-sided PICC line has been pulled back slightly with tip over the region of the SVC near the junction with the subclavian vein.   Electronically Signed   By: Elberta Fortis M.D.   On: 05/26/2015 21:02    PHYSICAL EXAM General: NAD Neck: Thick, JVP difficult but appears elevated, no thyromegaly or thyroid nodule.  Lungs: Clear to auscultation bilaterally with normal respiratory effort. CV: Nondisplaced PMI.  Heart regular S1/S2, no S3/S4, no murmur.  2+ edema to knees.   Abdomen: Soft, nontender, no hepatosplenomegaly, mild distention.  Neurologic: Alert and oriented x 3.  Psych: Normal affect. Extremities: No clubbing or  cyanosis.   TELEMETRY: Reviewed telemetry pt in NSR with BiV pacing  ASSESSMENT AND PLAN: 1. Acute on chronic systolic CHF: Nonischemic cardiomyopathy, s/p [employer] CRT-D. EF 30-35% (03/2015 - on milrinone). Now on home milrinone 0.375 mcg/kg/min so CPX not applicable. NYHA class IIIb symptoms chronically. She was admitted with elevated weight, nausea/vomiting, and pulmonary edema on CXR.  Nausea may be due to GI congestion.  Co-ox 78% this morning, no CVP set up yet.  Not sure how compliant she has been with regimen at home.  Creatinine basically stable at 2.3.  She has [employer] CRT-D device.  - Continue milrinone 0.375 mcg/kg/min with good co-ox. - Continue spironolactone 25 mg bid.  - Increase Lasix gtt to 15 mg/hr and dose with metolazone 2.5 mg x 1.  - Follow CVP.   - Our team was unable to offer her an LVAD due to concerns over an uncertain level of social support and also size/weight. I have referred her to [facility] for 2nd opinion but she has not yet gone.  2. OSA, suspected OHS: Using CPAP at night and oxygen during the day.   3. VT/VF, recurrent:  Ranolazine 500 mg bid and amiodarone 200 mg bid to continue until she follows up with Dr [name]. Her tremor may be related to amiodarone, but I think that she is going to have to continue this for now given issues with recurrent VT.  4. H/O GI bleed with AVM: No evidence of recurrent bleeding even with recent coumadin trial.  5. Pulmonary HTN: RHC 05/01/15 PVR 4.0, improved. Continue current dose of sildenafil.  6. Overall poor prognosis with few good options.    35 minutes critical care time.   [name] 06/15/2015 7:51 AM

## 2015-06-15 NOTE — Progress Notes (Signed)
[facility]  Patient Status: Active (receiving services up to time of hospitalization)  [facility] is providing the following services: RN, PT and Home Infusion Services (teaching and education will be done by nurse in the home with patient and caregiver)  If patient discharges after hours, please call [phone].   [name] 06/15/2015, 10:44 AM

## 2015-06-15 NOTE — Progress Notes (Signed)
Inpatient Diabetes Program Recommendations  AACE/ADA: New Consensus Statement on Inpatient Glycemic Control (2015)  Target Ranges:  Prepandial:   less than 140 mg/dL      Peak postprandial:   less than 180 mg/dL (1-2 hours)      Critically ill patients:  140 - 180 mg/dL   Review of Glycemic Control:  Results for ZONDRA, BOLSTAD (MRN [id number]) as of 06/15/2015 13:20  Ref. Range 06/24/2015 22:21 06/15/2015 08:02 06/15/2015 08:31 06/15/2015 11:47  Glucose-Capillary Latest Ref Range: 65-99 mg/dL 71 59 (L) 90 92    Diabetes history: Type 2 diabetes Outpatient Diabetes medications: Levemir 58 units bid, Novolog correction scale Current orders for Inpatient glycemic control:  Levemir 40 units bid  Inpatient Diabetes Program Recommendations:   Note that blood sugars have been <100 mg/dL since admission.  Patient was given Levemir 20 units last PM and Levemir was held this morning due to lows.  Please consider reducing Levemir to 15 units q HS.  Thanks, Beryl Meager, RN, BC-ADM Inpatient Diabetes Coordinator Pager [phone] (8a-5p)

## 2015-06-15 NOTE — Progress Notes (Signed)
[facility]  [name] is an active [facility] HF patient on the [facility] and home Milrinone.  [name] team will follow pt while inpatient and support her DC to home when ordered.    If patient discharges after hours, please call [phone].   [name] 06/15/2015, 11:24 AM

## 2015-06-16 ENCOUNTER — Inpatient Hospital Stay (HOSPITAL_COMMUNITY): Payer: Medicare Other

## 2015-06-16 LAB — BASIC METABOLIC PANEL
Anion gap: 17 — ABNORMAL HIGH (ref 5–15)
Anion gap: 19 — ABNORMAL HIGH (ref 5–15)
Anion gap: 20 — ABNORMAL HIGH (ref 5–15)
BUN: 82 mg/dL — AB (ref 6–20)
BUN: 90 mg/dL — ABNORMAL HIGH (ref 6–20)
BUN: 89 mg/dL — AB (ref 6–20)
CALCIUM: 9 mg/dL (ref 8.9–10.3)
CHLORIDE: 84 mmol/L — AB (ref 101–111)
CO2: 28 mmol/L (ref 22–32)
CO2: 26 mmol/L (ref 22–32)
CO2: 26 mmol/L (ref 22–32)
CREATININE: 3.01 mg/dL — AB (ref 0.44–1.00)
CREATININE: 3.14 mg/dL — AB (ref 0.44–1.00)
Calcium: 8.7 mg/dL — ABNORMAL LOW (ref 8.9–10.3)
Calcium: 9 mg/dL (ref 8.9–10.3)
Chloride: 87 mmol/L — ABNORMAL LOW (ref 101–111)
Chloride: 85 mmol/L — ABNORMAL LOW (ref 101–111)
Creatinine, Ser: 2.73 mg/dL — ABNORMAL HIGH (ref 0.44–1.00)
GFR calc Af Amer: 22 mL/min — ABNORMAL LOW (ref >60)
GFR calc Af Amer: 20 mL/min — ABNORMAL LOW (ref >60)
GFR calc non Af Amer: 19 mL/min — ABNORMAL LOW (ref >60)
GFR calc non Af Amer: 17 mL/min — ABNORMAL LOW (ref >60)
GFR, EST AFRICAN AMERICAN: 19 mL/min — AB (ref >60)
GFR, EST NON AFRICAN AMERICAN: 16 mL/min — AB (ref >60)
GLUCOSE: 75 mg/dL (ref 65–99)
Glucose, Bld: 203 mg/dL — ABNORMAL HIGH (ref 65–99)
Glucose, Bld: 148 mg/dL — ABNORMAL HIGH (ref 65–99)
POTASSIUM: 6.4 mmol/L — AB (ref 3.5–5.1)
POTASSIUM: 6.6 mmol/L — AB (ref 3.5–5.1)
Potassium: 7.2 mmol/L (ref 3.5–5.1)
SODIUM: 129 mmol/L — AB (ref 135–145)
SODIUM: 131 mmol/L — AB (ref 135–145)
Sodium: 132 mmol/L — ABNORMAL LOW (ref 135–145)

## 2015-06-16 LAB — IRON AND TIBC
Iron: 22 ug/dL — ABNORMAL LOW (ref 28–170)
Saturation Ratios: 5 % — ABNORMAL LOW (ref 10.4–31.8)
TIBC: 417 ug/dL (ref 250–450)
UIBC: 395 ug/dL

## 2015-06-16 LAB — URINALYSIS, ROUTINE W REFLEX MICROSCOPIC
Bilirubin Urine: NEGATIVE
Glucose, UA: NEGATIVE mg/dL
Ketones, ur: NEGATIVE mg/dL
Nitrite: NEGATIVE
PH: 5 (ref 5.0–8.0)
Protein, ur: NEGATIVE mg/dL
SPECIFIC GRAVITY, URINE: 1.011 (ref 1.005–1.030)
UROBILINOGEN UA: 1 mg/dL (ref 0.0–1.0)

## 2015-06-16 LAB — GLUCOSE, CAPILLARY
GLUCOSE-CAPILLARY: 82 mg/dL (ref 65–99)
GLUCOSE-CAPILLARY: 71 mg/dL (ref 65–99)
GLUCOSE-CAPILLARY: 111 mg/dL — AB (ref 65–99)
Glucose-Capillary: 89 mg/dL (ref 65–99)
Glucose-Capillary: 98 mg/dL (ref 65–99)

## 2015-06-16 LAB — CBC
HEMATOCRIT: 38.1 % (ref 36.0–46.0)
Hemoglobin: 11.8 g/dL — ABNORMAL LOW (ref 12.0–15.0)
MCH: 29.7 pg (ref 26.0–34.0)
MCHC: 31 g/dL (ref 30.0–36.0)
MCV: 96 fL (ref 78.0–100.0)
Platelets: 280 10*3/uL (ref 150–400)
RBC: 3.97 MIL/uL (ref 3.87–5.11)
RDW: 17.1 % — AB (ref 11.5–15.5)
WBC: 5.2 10*3/uL (ref 4.0–10.5)

## 2015-06-16 LAB — POCT ACTIVATED CLOTTING TIME
ACTIVATED CLOTTING TIME: 171 s
ACTIVATED CLOTTING TIME: 177 s
ACTIVATED CLOTTING TIME: 190 s
ACTIVATED CLOTTING TIME: 196 s
Activated Clotting Time: 153 seconds
Activated Clotting Time: 147 seconds
Activated Clotting Time: 178 seconds
Activated Clotting Time: 202 seconds
Activated Clotting Time: 171 seconds

## 2015-06-16 LAB — RENAL FUNCTION PANEL
ALBUMIN: 3.5 g/dL (ref 3.5–5.0)
ALBUMIN: 3.5 g/dL (ref 3.5–5.0)
ANION GAP: 21 — AB (ref 5–15)
ANION GAP: 13 (ref 5–15)
BUN: 92 mg/dL — ABNORMAL HIGH (ref 6–20)
BUN: 55 mg/dL — ABNORMAL HIGH (ref 6–20)
CALCIUM: 8.9 mg/dL (ref 8.9–10.3)
CALCIUM: 8.9 mg/dL (ref 8.9–10.3)
CO2: 25 mmol/L (ref 22–32)
CO2: 28 mmol/L (ref 22–32)
CREATININE: 3.13 mg/dL — AB (ref 0.44–1.00)
Chloride: 85 mmol/L — ABNORMAL LOW (ref 101–111)
Chloride: 90 mmol/L — ABNORMAL LOW (ref 101–111)
Creatinine, Ser: 1.85 mg/dL — ABNORMAL HIGH (ref 0.44–1.00)
GFR calc Af Amer: 36 mL/min — ABNORMAL LOW (ref >60)
GFR, EST AFRICAN AMERICAN: 19 mL/min — AB (ref >60)
GFR, EST NON AFRICAN AMERICAN: 16 mL/min — AB (ref >60)
GFR, EST NON AFRICAN AMERICAN: 31 mL/min — AB (ref >60)
GLUCOSE: 126 mg/dL — AB (ref 65–99)
Glucose, Bld: 145 mg/dL — ABNORMAL HIGH (ref 65–99)
PHOSPHORUS: 5.6 mg/dL — AB (ref 2.5–4.6)
POTASSIUM: 3.4 mmol/L — AB (ref 3.5–5.1)
Phosphorus: 3.7 mg/dL (ref 2.5–4.6)
Potassium: 6.6 mmol/L (ref 3.5–5.1)
SODIUM: 131 mmol/L — AB (ref 135–145)
Sodium: 131 mmol/L — ABNORMAL LOW (ref 135–145)

## 2015-06-16 LAB — URINE MICROSCOPIC-ADD ON

## 2015-06-16 LAB — HEMOGLOBIN A1C
Hgb A1c MFr Bld: 6.6 % — ABNORMAL HIGH (ref 4.8–5.6)
Mean Plasma Glucose: 143 mg/dL

## 2015-06-16 LAB — CARBOXYHEMOGLOBIN
CARBOXYHEMOGLOBIN: 2 % — AB (ref 0.5–1.5)
METHEMOGLOBIN: 1 % (ref 0.0–1.5)
O2 Saturation: 67.9 %
Total hemoglobin: 12.2 g/dL (ref 12.0–16.0)

## 2015-06-16 MED ORDER — CALCIUM CHLORIDE 10 % IV SOLN
1.0000 g | Freq: Once | INTRAVENOUS | Status: AC
  Administered 2015-06-16: 1 g via INTRAVENOUS
  Filled 2015-06-16 (×2): qty 10

## 2015-06-16 MED ORDER — METOLAZONE 5 MG PO TABS
5.0000 mg | ORAL_TABLET | Freq: Two times a day (BID) | ORAL | Status: DC
  Administered 2015-06-16 (×2): 5 mg via ORAL
  Filled 2015-06-16 (×2): qty 1

## 2015-06-16 MED ORDER — FERROUS SULFATE 300 (60 FE) MG/5ML PO SYRP
300.0000 mg | ORAL_SOLUTION | Freq: Three times a day (TID) | ORAL | Status: DC
  Filled 2015-06-16 (×2): qty 5

## 2015-06-16 MED ORDER — INSULIN ASPART 100 UNIT/ML ~~LOC~~ SOLN
10.0000 [IU] | Freq: Once | SUBCUTANEOUS | Status: AC
  Administered 2015-06-16: 10 [IU] via SUBCUTANEOUS

## 2015-06-16 MED ORDER — PRISMASOL BGK 0/2.5 32-2.5 MEQ/L IV SOLN
INTRAVENOUS | Status: DC
  Administered 2015-06-16 (×2): via INTRAVENOUS_CENTRAL
  Filled 2015-06-16 (×4): qty 5000

## 2015-06-16 MED ORDER — PRISMASOL BGK 4/2.5 32-4-2.5 MEQ/L IV SOLN
INTRAVENOUS | Status: DC
  Administered 2015-06-16 – 2015-06-21 (×18): via INTRAVENOUS_CENTRAL
  Filled 2015-06-16 (×27): qty 5000

## 2015-06-16 MED ORDER — ANTISEPTIC ORAL RINSE SOLUTION (CORINZ)
7.0000 mL | OROMUCOSAL | Status: DC
  Administered 2015-06-16 – 2015-06-20 (×21): 7 mL via OROMUCOSAL

## 2015-06-16 MED ORDER — HEPARIN BOLUS VIA INFUSION (CRRT)
1000.0000 [IU] | INTRAVENOUS | Status: DC | PRN
  Administered 2015-06-16: 1000 [IU] via INTRAVENOUS_CENTRAL
  Filled 2015-06-16 (×2): qty 1000

## 2015-06-16 MED ORDER — PANTOPRAZOLE SODIUM 40 MG PO PACK
40.0000 mg | PACK | Freq: Every day | ORAL | Status: DC
  Administered 2015-06-16 – 2015-06-17 (×2): 40 mg
  Filled 2015-06-16 (×3): qty 20

## 2015-06-16 MED ORDER — PRISMASOL BGK 4/2.5 32-4-2.5 MEQ/L IV SOLN
INTRAVENOUS | Status: DC
  Administered 2015-06-16 – 2015-06-21 (×16): via INTRAVENOUS_CENTRAL
  Filled 2015-06-16 (×23): qty 5000

## 2015-06-16 MED ORDER — ALTEPLASE 2 MG IJ SOLR
2.0000 mg | Freq: Once | INTRAMUSCULAR | Status: AC
  Administered 2015-06-16: 2 mg
  Filled 2015-06-16: qty 2

## 2015-06-16 MED ORDER — ASPIRIN 81 MG PO CHEW
81.0000 mg | CHEWABLE_TABLET | Freq: Every day | ORAL | Status: DC
Start: 2015-06-16 — End: 2015-06-18
  Administered 2015-06-16 – 2015-06-18 (×3): 81 mg
  Filled 2015-06-16 (×3): qty 1

## 2015-06-16 MED ORDER — SODIUM CHLORIDE 0.9 % FOR CRRT
INTRAVENOUS_CENTRAL | Status: DC | PRN
  Filled 2015-06-16: qty 1000

## 2015-06-16 MED ORDER — GABAPENTIN 250 MG/5ML PO SOLN
600.0000 mg | Freq: Four times a day (QID) | ORAL | Status: DC
  Administered 2015-06-16 – 2015-06-17 (×7): 600 mg
  Filled 2015-06-16 (×11): qty 12

## 2015-06-16 MED ORDER — PRISMASOL BGK 0/2.5 32-2.5 MEQ/L IV SOLN
INTRAVENOUS | Status: DC
  Administered 2015-06-16 (×2): via INTRAVENOUS_CENTRAL
  Filled 2015-06-16 (×3): qty 5000

## 2015-06-16 MED ORDER — PRISMASOL BGK 4/2.5 32-4-2.5 MEQ/L IV SOLN
INTRAVENOUS | Status: DC
  Administered 2015-06-16 – 2015-06-21 (×31): via INTRAVENOUS_CENTRAL
  Filled 2015-06-16 (×53): qty 5000

## 2015-06-16 MED ORDER — SODIUM CHLORIDE 0.9 % IJ SOLN
250.0000 [IU]/h | INTRAMUSCULAR | Status: DC
  Administered 2015-06-16: 250 [IU]/h via INTRAVENOUS_CENTRAL
  Administered 2015-06-17: 1050 [IU]/h via INTRAVENOUS_CENTRAL
  Administered 2015-06-17 – 2015-06-19 (×4): 950 [IU]/h via INTRAVENOUS_CENTRAL
  Administered 2015-06-19: 900 [IU]/h via INTRAVENOUS_CENTRAL
  Administered 2015-06-19: 950 [IU]/h via INTRAVENOUS_CENTRAL
  Administered 2015-06-20: 650 [IU]/h via INTRAVENOUS_CENTRAL
  Administered 2015-06-20: 900 [IU]/h via INTRAVENOUS_CENTRAL
  Administered 2015-06-21: 650 [IU]/h via INTRAVENOUS_CENTRAL
  Filled 2015-06-16 (×12): qty 2

## 2015-06-16 MED ORDER — DEXTROSE 50 % IV SOLN
1.0000 | Freq: Once | INTRAVENOUS | Status: AC
  Administered 2015-06-16: 50 mL via INTRAVENOUS
  Filled 2015-06-16: qty 50

## 2015-06-16 MED ORDER — PRISMASOL BGK 0/2.5 32-2.5 MEQ/L IV SOLN
INTRAVENOUS | Status: DC
  Administered 2015-06-16 (×4): via INTRAVENOUS_CENTRAL
  Filled 2015-06-16 (×9): qty 5000

## 2015-06-16 MED ORDER — HEPARIN SODIUM (PORCINE) 1000 UNIT/ML DIALYSIS
1000.0000 [IU] | INTRAMUSCULAR | Status: DC | PRN
  Administered 2015-06-21: 2800 [IU] via INTRAVENOUS_CENTRAL
  Filled 2015-06-16: qty 6
  Filled 2015-06-16: qty 4
  Filled 2015-06-16: qty 6

## 2015-06-16 NOTE — Progress Notes (Signed)
Patient ID: [name], female   DOB: [birth year], 49 y.o.   MRN: [id number]   SUBJECTIVE: Patient was admitted with nausea, vomiting, and weight gain. Pulmonary edema on CXR.    CVP 28-30 this morning.  Despite marked volume overload she has diuresed poorly and creatinine trending up.  K high today but nurse concerned sample may be hemolyzed due to difficulty drawing.  Co-ox is 68% on milrinone 0.375.  She is lethargic but awakens and answers questions.  Remains nauseated.   Scheduled Meds: . allopurinol  300 mg Oral Daily  . alteplase  2 mg Intracatheter Once  . alteplase  2 mg Intracatheter Once  . amiodarone  200 mg Oral BID  . aspirin EC  81 mg Oral Daily  . ferrous sulfate  325 mg Oral TID  . gabapentin  600 mg Oral QID  . heparin subcutaneous  5,000 Units Subcutaneous 3 times per day  . hydrALAZINE  12.5 mg Oral 3 times per day  . Influenza vac split quadrivalent PF  0.5 mL Intramuscular Tomorrow-1000  . insulin aspart  0-15 Units Subcutaneous TID WC  . insulin detemir  40 Units Subcutaneous BID  . magnesium oxide  400 mg Oral BID  . metolazone  5 mg Oral BID  . pantoprazole  40 mg Oral Daily  . ranolazine  500 mg Oral BID  . sildenafil  40 mg Oral TID   Continuous Infusions: . furosemide (LASIX) infusion 15 mg/hr (06/15/15 2000)  . milrinone 0.375 mcg/kg/min (06/16/15 0428)   PRN Meds:.albuterol, cyclobenzaprine, ondansetron (ZOFRAN) IV, oxyCODONE-acetaminophen **AND** oxyCODONE    Filed Vitals:   06/16/15 0431 06/16/15 0500 06/16/15 0600 06/16/15 0610  BP:  111/85 114/79 114/79  Pulse:      Temp:      TempSrc:      Resp:  15 14   Height:      Weight: 280 lb 10.3 oz (127.3 kg)     SpO2:  94% 95%     Intake/Output Summary (Last 24 hours) at 06/16/15 0732 Last data filed at 06/16/15 0600  Gross per 24 hour  Intake 1820.88 ml  Output    890 ml  Net 930.88 ml    LABS: Basic Metabolic Panel:  Recent Labs  94/76/54 0418 06/16/15 0513  NA 134* 129*  K  3.9 6.4*  CL 88* 84*  CO2 36* 28  GLUCOSE 55* 203*  BUN 85* 82*  CREATININE 2.32* 2.73*  CALCIUM 8.8* 8.7*  MG 2.2  --    Liver Function Tests:  Recent Labs  07/01/2015 1653  AST 80*  ALT 42  ALKPHOS 121  BILITOT 2.4*  PROT 8.3*  ALBUMIN 3.6    Recent Labs  06/16/2015 1653  LIPASE 35   CBC:  Recent Labs  06/30/2015 1653 06/16/15 0513  WBC 5.8 5.2  HGB 13.0 11.8*  HCT 40.9 38.1  MCV 94.0 96.0  PLT 294 280   Cardiac Enzymes:  Recent Labs  07/05/2015 1645  TROPONINI <0.03   BNP: Invalid input(s): POCBNP D-Dimer: No results for input(s): DDIMER in the last 72 hours. Hemoglobin A1C:  Recent Labs  06/13/2015 1653  HGBA1C 6.6*   Fasting Lipid Panel: No results for input(s): CHOL, HDL, LDLCALC, TRIG, CHOLHDL, LDLDIRECT in the last 72 hours. Thyroid Function Tests: No results for input(s): TSH, T4TOTAL, T3FREE, THYROIDAB in the last 72 hours.  Invalid input(s): FREET3 Anemia Panel: No results for input(s): VITAMINB12, FOLATE, FERRITIN, TIBC, IRON, RETICCTPCT in the last 72 hours.  RADIOLOGY: Ct Abdomen Pelvis Wo Contrast  2015-06-29   CLINICAL DATA:  Uncontrolled vomiting for 5 days  EXAM: CT ABDOMEN AND PELVIS WITHOUT CONTRAST  TECHNIQUE: Multidetector CT imaging of the abdomen and pelvis was performed following the standard protocol without IV contrast.  COMPARISON:  01/20/2015  FINDINGS: Subsegmental atelectasis at the left lung base. Severe cardiomegaly. AICD device.  Postcholecystectomy  The left lobe of the liver is prominent but there is no evidence of nodularity of the contour of the liver.  Spleen, pancreas, adrenal glands, and kidneys are within normal limits.  There is significant stranding throughout the subcutaneous fat across the abdomen.  Bladder and uterus are within normal limits. There is a 1.9 cm hyperdense area within the left adnexa of unknown significance. It may represent an ovarian complex cyst.  There is a small amount of free fluid layering  in the pelvis. There is no obvious retroperitoneal adenopathy.  IMPRESSION: Small amount of free fluid in the pelvis is nonspecific.  1.9 cm hyperdense abnormality in the left adnexa may represent a lesion in the ovary. Pelvic ultrasound is warranted.  There is edema within the subcutaneous fat across the abdomen of unknown significance.  Left basilar subsegmental atelectasis.   Electronically Signed   By: Jolaine Click M.D.   On: June 29, 2015 19:02   Dg Chest Portable 1 View  2015/06/29   CLINICAL DATA:  Bradycardia, CHF, chronic milrinone infusion, nausea and vomiting  EXAM: PORTABLE CHEST 1 VIEW  COMPARISON:  05/26/2015  FINDINGS: Right PICC line tip remains at the right innominate vein as before. Left subclavian multilead defibrillator/pacer evident. Heart is enlarged with vascular and interstitial prominence suggesting mild edema. Limited exam because of body habitus. Increased left base density suggest chronic atelectasis when compared to the prior CT.  IMPRESSION: Cardiomegaly with increased vascular and interstitial prominence suggesting mild developing edema pattern compare to 05/26/2015.   Electronically Signed   By: Judie Petit.  Shick M.D.   On: 06/29/15 17:25   Dg Chest Port 1 View  05/26/2015   CLINICAL DATA:  Shortness of breath and syncope 1 day.  EXAM: PORTABLE CHEST - 1 VIEW  COMPARISON:  05/16/2015  FINDINGS: Right-sided PICC line has been pulled back slightly with tip overlying the region of the SVC near the junction of the subclavian vein. Left-sided pacemaker unchanged.  Lungs are hypoinflated with mild prominence of the perihilar markings suggesting mild vascular congestion. There is mild stable cardiomegaly. Remainder of the exam is unchanged.  IMPRESSION: Stable mild cardiomegaly with evidence of mild vascular congestion.  Right-sided PICC line has been pulled back slightly with tip over the region of the SVC near the junction with the subclavian vein.   Electronically Signed   By: Elberta Fortis  M.D.   On: 05/26/2015 21:02    PHYSICAL EXAM General: NAD Neck: Thick, JVP difficult but appears elevated, no thyromegaly or thyroid nodule.  Lungs: Clear to auscultation bilaterally with normal respiratory effort. CV: Nondisplaced PMI.  Heart regular S1/S2, no S3/S4, no murmur.  2+ edema to knees.   Abdomen: Soft, nontender, no hepatosplenomegaly, mild distention.  Neurologic: Alert and oriented x 3.  Psych: Normal affect. Extremities: No clubbing or cyanosis.   TELEMETRY: Reviewed telemetry pt in NSR with BiV pacing  ASSESSMENT AND PLAN: 1. Acute on chronic systolic CHF: Nonischemic cardiomyopathy, s/p [employer] CRT-D. EF 30-35% (03/2015 - on milrinone). Now on home milrinone 0.375 mcg/kg/min. NYHA class IIIb symptoms chronically. She was admitted with elevated weight, nausea/vomiting, and pulmonary  edema on CXR.  Nausea may be due to GI congestion.  Volume has been very difficult to control at home despite large diuretic doses.  Co-ox has been adequate but CVP 28-30 and diuresing poorly with rise in creatinine (re-drawing because of concern about inaccuracy of K reading).  - Continue milrinone 0.375 mcg/kg/min with good co-ox. - Stop K and spironolactone for now, resending BMET.   - Still marked volume overload => keep Lasix at 15 mg/hr and add metolazone 5 mg bid to see if we can decongest.  I am not confident that diuretic strategy is going to work with her degree of cardiorenal syndrome.  I am going to consult renal for help, ?short-term CVVH to decongest.  - Follow CVP/co-ox.   - Our team was unable to offer her an LVAD due to concerns over an uncertain level of social support and also size/weight. I have referred her to [facility] for 2nd opinion but she had not gone yet and I do not think that this is going to be an option at this point.  Unfortunately, nearing the point where we are out of good options.   2. OSA, suspected OHS: Using CPAP at night and oxygen during the day.    3. VT/VF, recurrent: Ranolazine 500 mg bid and amiodarone 200 mg bid to continue until she follows up with Dr [name]. Her tremor may be related to amiodarone, but I think that she is going to have to continue this for now given issues with recurrent VT.  4. H/O GI bleed with AVM: No evidence of recurrent bleeding even with recent coumadin trial.  5. Pulmonary HTN: [facility] 05/01/15 PVR 4.0, improved. Continue current dose of sildenafil.  6. AKI on CKD: As above, suspect advancing cardiorenal syndrome.  Will consult nephrology to consider a time-limited CVVH course to see if she can be decongested.     35 minutes critical care time.   [name] 06/16/2015 7:32 AM

## 2015-06-16 NOTE — Progress Notes (Signed)
eLink Physician-Brief Progress Note Patient Name: [name] DOB: [birth year] MRN: [id number]   Date of Service  06/16/2015  HPI/Events of Note  Hypothermia - related to CRRT.   eICU Interventions  Will order: 1. [employer]. 2. Place esophageal temperature probe.     Intervention Category Major Interventions: Other:  [name] 06/16/2015, 9:32 PM

## 2015-06-16 NOTE — Progress Notes (Signed)
Potassium down to 3.4-notified Dr Juel Burrow w/ new order to change fluids and stop lasix gtt.

## 2015-06-16 NOTE — Consult Note (Signed)
   [facility] CM Inpatient Consult   06/16/2015  [name] 19-Sep-1965 [id number] Patient is currently active with [facility] Management for chronic disease management services.  Patient has been engaged by a [employer].  Our community based plan of care has focused on disease management and community resource support.  Patient will receive a post discharge transition of care call and will be evaluated for monthly home visits for assessments and disease process education.  Made Inpatient Case Manager, Eunice Blase,  aware that [facility] Care Management following. Of note, [facility] Care Management services does not replace or interfere with any services that are arranged by inpatient case management or social work.  For additional questions or referrals please contact: Charlesetta Shanks, RN BSN CCM [facility]  [phone] business mobile phone

## 2015-06-16 NOTE — Consult Note (Signed)
Reason for Consult:AKI, fluid overload Referring Physician: Dr. Bella Kennedy is an 49 y.o. female.  HPI: 49 yr female with hx DM, massive obesity, AVMs with chronic GI blood loss, OSA, GERD,  And Nonischemic DM with EF of about 30%, severe RHFwith MR. On home inotropes. Admitted now with fluid overload, SOB and progressive edema.  Has not responded to aggressive attempts to diurese.  Hx of Crs in mid to high 2s most recently , ^ from low 1's early in 2016.  She cannot give hx due to lethargy now. Review of systems not obtained due to patient factors.   Past Medical History  Diagnosis Date  . CHF (congestive heart failure) (HCC)     a. EF 30-35%, RV mildly dilated (difficult study 11/2013) b. RHC (12/2013): RA 38/37 (35), RV 90/28, PA 95/51 (71), PCWP 54, PA 40%, AO 96 %, CO/CI Fick 3.2/1.4, PVR 5.3   . Nonischemic cardiomyopathy ([place]) 12/2013    a. LHC (12/2013) normal coronary anatomy   . Mitral regurgitation     moderate to severe  . LBBB (left bundle branch block)   . HTN (hypertension)   . Asthma   . IBS (irritable bowel syndrome)     with primarily constipation  . Morbid obesity ([place])   . GERD (gastroesophageal reflux disease)   . IDA (iron deficiency anemia)     2o TO SB AVMS, Hx parenteral iron Dr Tressie Stalker  . AVM (arteriovenous malformation)     Small bowel, s/p duble balloon enteroscopy/APC jejunum Dr Arsenio Loader [facility]) 01/23/2011  . Peripheral neuropathy ([place])   . OSA (obstructive sleep apnea)     on CPAP qhs  . Diabetes mellitus without complication ([place])   . Torsades de pointes ([place])     a. appropriate ICD therapy 12/2014 in setting of hypokalemia  . Pneumonia     Past Surgical History  Procedure Laterality Date  . Appendectomy    . Cholecystectomy      biliary dyskinesia  . Mastectomy  2003    left, partial  . Knee arthroscopy  2005    left  . Small bowel enteroscopy  MAY 2012 [facility] DR. GILLIAM    SB AVMS s/p APC  . Colonoscopy  OCT 2010/SEP 2011     tortuous colon, 3 polyps-benign(2010),   . Upper gastrointestinal endoscopy  '08, '10, SEP 2011    mild antral gastritis (2010), negative SB bx (2010), incomlpete Schatzki's ring (2011)  . Small bowel enteroscopy  SEP 2011 [facility]    Fields-NO AVMS  . Givens capsule study  NOV 2010     NO AVMS, normal  . Irrigation and debridement abscess  06/25/2012    Procedure: MINOR INCISION AND DRAINAGE OF ABSCESS;  Surgeon: Scherry Ran, MD;  Location: [facility] ORS;  Service: General;  Laterality: [name];  Incision & Drainage of Infected Sebaceous Cyst on Chest  . Breast surgery    . Implantable cardioverter defibrillator generator change Left 02/15/2012    BSX CRTD   . Left and right heart catheterization with coronary/graft angiogram  12/12/2013    Procedure: LEFT AND RIGHT HEART CATHETERIZATION WITH Beatrix Fetters;  Surgeon: Peter M Martinique, MD;  Location: [facility] CATH LAB;  Service: Cardiovascular;;  . Right heart catheterization [name] 01/06/2015    Procedure: RIGHT HEART CATH;  Surgeon: Larey Dresser, MD;  Location: [facility] CATH LAB;  Service: Cardiovascular;  Laterality: [name];  . Esophagogastroduodenoscopy  Sept 2013    [facility]: diminutive duodenal polyp, polyp  in gastric cardia, path with benign duodenal mucosa with focally prominent Brunner's glands. Fundic gland polyps.   . Cardiac catheterization [name] 03/31/2015    Procedure: Right Heart Cath;  Surgeon: Daniel R Bensimhon, MD;  Location: [facility] INVASIVE CV LAB;  Service: Cardiovascular;  Laterality: [name];  . Cardiac catheterization [name] 04/23/2015    Procedure: Right Heart Cath;  Surgeon: Daniel R Bensimhon, MD;  Location: [facility] INVASIVE CV LAB;  Service: Cardiovascular;  Laterality: [name];    Family History  Problem Relation Age of Onset  . Colon cancer Paternal Uncle   . Cervical cancer Mother   . Hypertension Mother   . Cancer Mother   . Heart disease Father   . Hypertension Father   . GER disease Father   . Heart attack Father   . Bleeding Disorder  Father   . Diabetes Sister   . Heart disease Sister   . Hypertension Sister     Social History:  reports that she has quit smoking. Her smoking use included Cigarettes. She smoked 0.00 packs per day for 0 years. She has quit using smokeless tobacco. She reports that she does not drink alcohol or use illicit drugs.  Allergies:  Allergies  Allergen Reactions  . Cymbalta [Duloxetine Hcl] Other (See Comments)    Only in a mild dose pt can tolerate  . Trazodone And Nefazodone Other (See Comments)    Nightmares   . Lyrica [Pregabalin] Swelling    Medications:  I have reviewed the patient's current medications. Prior to Admission:  Prescriptions prior to admission  Medication Sig Dispense Refill Last Dose  . albuterol (PROVENTIL HFA;VENTOLIN HFA) 108 (90 BASE) MCG/ACT inhaler Inhale 2 puffs into the lungs every 6 (six) hours as needed for wheezing or shortness of breath.   1 week  . allopurinol (ZYLOPRIM) 300 MG tablet Take 1 tablet (300 mg total) by mouth daily. 30 tablet 6 07/02/2015 at Unknown time  . amiodarone (PACERONE) 200 MG tablet Take 200 mg by mouth 2 (two) times daily.   07/07/2015 at Unknown time  . aspirin 81 MG tablet Take 1 tablet (81 mg total) by mouth daily. 30 tablet  06/25/2015 at Unknown time  . cyclobenzaprine (FLEXERIL) 10 MG tablet Take 10 mg by mouth 3 (three) times daily as needed for muscle spasms.    PRN  . docusate sodium (COLACE) 100 MG capsule Take 1 capsule (100 mg total) by mouth 2 (two) times daily as needed for mild constipation. (Patient taking differently: Take 100 mg by mouth 2 (two) times daily. ) 60 capsule 6 07/10/2015 at Unknown time  . DULoxetine (CYMBALTA) 30 MG capsule Take 30 mg by mouth daily.   06/29/2015 at Unknown time  . ferrous sulfate 325 (65 FE) MG tablet Take 325 mg by mouth 3 (three) times daily.   06/23/2015 at Unknown time  . gabapentin (NEURONTIN) 300 MG capsule Take 600 mg by mouth 4 (four) times daily.   06/25/2015 at Unknown time  .  hydrALAZINE (APRESOLINE) 25 MG tablet Take 0.5 tablets (12.5 mg total) by mouth every 8 (eight) hours. 90 tablet 6 07/03/2015 at Unknown time  . insulin aspart (NOVOLOG) 100 UNIT/ML injection Inject 0-20 Units into the skin 3 (three) times daily with meals. (Patient taking differently: Inject 0-20 Units into the skin 3 (three) times daily with meals. Per sliding scale: 120 - 150 = 2 units, 151 - 200 = 4 units, 201 - 250 = 7 units, 251 - 300 = 11 units, 301 -   350 = 15 units, 351 and above = 20 units) 1 vial 5 06/12/2015 at Unknown time  . insulin detemir (LEVEMIR) 100 UNIT/ML injection Inject 0.58 mLs (58 Units total) into the skin 2 (two) times daily. 10 mL 11 07/10/2015 at Unknown time  . Linaclotide (LINZESS) 290 MCG CAPS capsule Take 1 capsule (290 mcg total) by mouth daily. 30 minutes before breakfast 30 capsule 5 06/12/2015 at Unknown time  . magnesium oxide (MAG-OX) 400 (241.3 MG) MG tablet Take 1 tablet (400 mg total) by mouth 2 (two) times daily. 60 tablet 6 07/10/2015 at Unknown time  . metolazone (ZAROXOLYN) 2.5 MG tablet Take 1 tablet (2.5 mg total) by mouth every other day. 15 tablet 3 06/16/2015 at Unknown time  . milrinone (PRIMACOR) 20 MG/100ML SOLN infusion Inject 47.5125 mcg/min into the vein continuous. 100 mL 6 07/10/2015 at Unknown time  . omeprazole (PRILOSEC) 40 MG capsule Take 1 capsule (40 mg total) by mouth daily. 30 capsule 6 06/30/2015 at Unknown time  . oxyCODONE-acetaminophen (PERCOCET) 10-325 MG per tablet Take 0.5-1 tablets by mouth every 4 (four) hours as needed for pain.    2 weeks  . potassium chloride SA (K-DUR,KLOR-CON) 20 MEQ tablet Take 4 tables (80 meq in am) take 2 tablets (40 meq) at lunch and take 4 tablets (80 meq) at bedtime 90 tablet 3 07/08/2015 at Unknown time  . ranolazine (RANEXA) 500 MG 12 hr tablet Take 1 tablet (500 mg total) by mouth 2 (two) times daily. 60 tablet 6 06/27/2015 at Unknown time  . sildenafil (REVATIO) 20 MG tablet Take 2 tablets (40 mg total) by  mouth 3 (three) times daily. 90 tablet 1 06/12/2015 at Unknown time  . spironolactone (ALDACTONE) 25 MG tablet TAKE 1 TABLET BY MOUTH TWICE DAILY. 60 tablet 3 07/04/2015 at Unknown time  . temazepam (RESTORIL) 30 MG capsule Take 30 mg by mouth at bedtime.   06/13/2015 at Unknown time  . torsemide (DEMADEX) 100 MG tablet Take 1 tablet (100 mg total) by mouth 2 (two) times daily. 60 tablet 6 06/13/2015 at Unknown time  . vitamin E 400 UNIT capsule Take 400 Units by mouth daily.   06/13/2015 at Unknown time    Results for orders placed or performed during the hospital encounter of 07/04/2015 (from the past 48 hour(s))  Troponin I     Status: None   Collection Time: 07/11/2015  4:45 PM  Result Value Ref Range   Troponin I <0.03 <0.031 ng/mL    Comment:        NO INDICATION OF MYOCARDIAL INJURY.   CBC     Status: Abnormal   Collection Time: 07/11/2015  4:53 PM  Result Value Ref Range   WBC 5.8 4.0 - 10.5 K/uL   RBC 4.35 3.87 - 5.11 MIL/uL   Hemoglobin 13.0 12.0 - 15.0 g/dL   HCT 40.9 36.0 - 46.0 %   MCV 94.0 78.0 - 100.0 fL   MCH 29.9 26.0 - 34.0 pg   MCHC 31.8 30.0 - 36.0 g/dL   RDW 16.8 (H) 11.5 - 15.5 %   Platelets 294 150 - 400 K/uL  I-stat troponin, ED     Status: None   Collection Time: 07/08/2015  4:53 PM  Result Value Ref Range   Troponin i, poc 0.00 0.00 - 0.08 ng/mL   Comment 3            Comment: Due to the release kinetics of cTnI, a negative result within the first hours  of the onset of symptoms does not rule out myocardial infarction with certainty. If myocardial infarction is still suspected, repeat the test at appropriate intervals.   Brain natriuretic peptide     Status: Abnormal   Collection Time: 07/08/2015  4:53 PM  Result Value Ref Range   B Natriuretic Peptide 388.7 (H) 0.0 - 100.0 pg/mL  Comprehensive metabolic panel     Status: Abnormal   Collection Time: 07/04/2015  4:53 PM  Result Value Ref Range   Sodium 130 (L) 135 - 145 mmol/L   Potassium 4.9 3.5 - 5.1 mmol/L    Chloride 87 (L) 101 - 111 mmol/L   CO2 29 22 - 32 mmol/L   Glucose, Bld 69 65 - 99 mg/dL   BUN 86 (H) 6 - 20 mg/dL   Creatinine, Ser 2.23 (H) 0.44 - 1.00 mg/dL   Calcium 8.7 (L) 8.9 - 10.3 mg/dL   Total Protein 8.3 (H) 6.5 - 8.1 g/dL   Albumin 3.6 3.5 - 5.0 g/dL   AST 80 (H) 15 - 41 U/L   ALT 42 14 - 54 U/L   Alkaline Phosphatase 121 38 - 126 U/L   Total Bilirubin 2.4 (H) 0.3 - 1.2 mg/dL   GFR calc non Af Amer 25 (L) >60 mL/min   GFR calc Af Amer 29 (L) >60 mL/min    Comment: (NOTE) The eGFR has been calculated using the CKD EPI equation. This calculation has not been validated in all clinical situations. eGFR's persistently <60 mL/min signify possible Chronic Kidney Disease.    Anion gap 14 5 - 15  Lipase, blood     Status: None   Collection Time: 07/09/2015  4:53 PM  Result Value Ref Range   Lipase 35 22 - 51 U/L  Hemoglobin A1c     Status: Abnormal   Collection Time: 06/22/2015  4:53 PM  Result Value Ref Range   Hgb A1c MFr Bld 6.6 (H) 4.8 - 5.6 %    Comment: (NOTE)         Pre-diabetes: 5.7 - 6.4         Diabetes: >6.4         Glycemic control for adults with diabetes: <7.0    Mean Plasma Glucose 143 mg/dL    Comment: (NOTE) Performed At: [facility] [address] [place], [place] [id number] [place] William F MD Ph:[phone]   MRSA PCR Screening     Status: None   Collection Time: 06/13/2015  8:28 PM  Result Value Ref Range   MRSA by PCR NEGATIVE NEGATIVE    Comment:        The GeneXpert MRSA Assay (FDA approved for NASAL specimens only), is one component of a comprehensive MRSA colonization surveillance program. It is not intended to diagnose MRSA infection nor to guide or monitor treatment for MRSA infections.   Glucose, capillary     Status: None   Collection Time: 07/03/2015 10:21 PM  Result Value Ref Range   Glucose-Capillary 71 65 - 99 mg/dL   Comment 1 Capillary Specimen    Comment 2 Notify RN   Basic metabolic panel     Status: Abnormal    Collection Time: 06/15/15  4:18 AM  Result Value Ref Range   Sodium 134 (L) 135 - 145 mmol/L   Potassium 3.9 3.5 - 5.1 mmol/L    Comment: DELTA CHECK NOTED   Chloride 88 (L) 101 - 111 mmol/L   CO2 36 (H) 22 - 32 mmol/L   Glucose,   Bld 55 (L) 65 - 99 mg/dL   BUN 85 (H) 6 - 20 mg/dL   Creatinine, Ser 2.32 (H) 0.44 - 1.00 mg/dL   Calcium 8.8 (L) 8.9 - 10.3 mg/dL   GFR calc non Af Amer 24 (L) >60 mL/min   GFR calc Af Amer 27 (L) >60 mL/min    Comment: (NOTE) The eGFR has been calculated using the CKD EPI equation. This calculation has not been validated in all clinical situations. eGFR's persistently <60 mL/min signify possible Chronic Kidney Disease.    Anion gap 10 5 - 15  Magnesium     Status: None   Collection Time: 06/15/15  4:18 AM  Result Value Ref Range   Magnesium 2.2 1.7 - 2.4 mg/dL  Carboxyhemoglobin     Status: Abnormal   Collection Time: 06/15/15  4:20 AM  Result Value Ref Range   Total hemoglobin 11.9 (L) 12.0 - 16.0 g/dL   O2 Saturation 77.9 %   Carboxyhemoglobin 2.2 (H) 0.5 - 1.5 %   Methemoglobin 0.6 0.0 - 1.5 %  Glucose, capillary     Status: Abnormal   Collection Time: 06/15/15  8:02 AM  Result Value Ref Range   Glucose-Capillary 59 (L) 65 - 99 mg/dL   Comment 1 Capillary Specimen   Glucose, capillary     Status: None   Collection Time: 06/15/15  8:31 AM  Result Value Ref Range   Glucose-Capillary 90 65 - 99 mg/dL   Comment 1 Capillary Specimen   Glucose, capillary     Status: None   Collection Time: 06/15/15 11:47 AM  Result Value Ref Range   Glucose-Capillary 92 65 - 99 mg/dL   Comment 1 Capillary Specimen   Glucose, capillary     Status: Abnormal   Collection Time: 06/15/15  4:12 PM  Result Value Ref Range   Glucose-Capillary 111 (H) 65 - 99 mg/dL   Comment 1 Capillary Specimen   Glucose, capillary     Status: None   Collection Time: 06/15/15  9:11 PM  Result Value Ref Range   Glucose-Capillary 91 65 - 99 mg/dL   Comment 1 Notify RN   Glucose,  capillary     Status: None   Collection Time: 06/16/15  4:10 AM  Result Value Ref Range   Glucose-Capillary 89 65 - 99 mg/dL   Comment 1 Capillary Specimen   Carboxyhemoglobin     Status: Abnormal   Collection Time: 06/16/15  4:57 AM  Result Value Ref Range   Total hemoglobin 12.2 12.0 - 16.0 g/dL   O2 Saturation 67.9 %   Carboxyhemoglobin 2.0 (H) 0.5 - 1.5 %   Methemoglobin 1.0 0.0 - 1.5 %  Basic metabolic panel     Status: Abnormal   Collection Time: 06/16/15  5:13 AM  Result Value Ref Range   Sodium 129 (L) 135 - 145 mmol/L   Potassium 6.4 (HH) 3.5 - 5.1 mmol/L    Comment: CRITICAL RESULT CALLED TO, READ BACK BY AND VERIFIED WITH: NORMENT M,RN 06/16/15 0546 WAYK    Chloride 84 (L) 101 - 111 mmol/L   CO2 28 22 - 32 mmol/L   Glucose, Bld 203 (H) 65 - 99 mg/dL   BUN 82 (H) 6 - 20 mg/dL   Creatinine, Ser 2.73 (H) 0.44 - 1.00 mg/dL   Calcium 8.7 (L) 8.9 - 10.3 mg/dL   GFR calc non Af Amer 19 (L) >60 mL/min   GFR calc Af Amer 22 (L) >60 mL/min    Comment: (  NOTE) The eGFR has been calculated using the CKD EPI equation. This calculation has not been validated in all clinical situations. eGFR's persistently <60 mL/min signify possible Chronic Kidney Disease.    Anion gap 17 (H) 5 - 15  CBC     Status: Abnormal   Collection Time: 06/16/15  5:13 AM  Result Value Ref Range   WBC 5.2 4.0 - 10.5 K/uL   RBC 3.97 3.87 - 5.11 MIL/uL   Hemoglobin 11.8 (L) 12.0 - 15.0 g/dL   HCT 38.1 36.0 - 46.0 %   MCV 96.0 78.0 - 100.0 fL   MCH 29.7 26.0 - 34.0 pg   MCHC 31.0 30.0 - 36.0 g/dL   RDW 17.1 (H) 11.5 - 15.5 %   Platelets 280 150 - 400 K/uL  Glucose, capillary     Status: None   Collection Time: 06/16/15  8:05 AM  Result Value Ref Range   Glucose-Capillary 82 65 - 99 mg/dL   Comment 1 Capillary Specimen   Basic metabolic panel     Status: Abnormal   Collection Time: 06/16/15  9:32 AM  Result Value Ref Range   Sodium 132 (L) 135 - 145 mmol/L   Potassium 7.2 (HH) 3.5 - 5.1 mmol/L     Comment: SPECIMEN HEMOLYZED. HEMOLYSIS MAY AFFECT INTEGRITY OF RESULTS. CRITICAL RESULT CALLED TO, READ BACK BY AND VERIFIED WITH: C.WOLF,RN 1059 06/16/15 CLARK,S    Chloride 87 (L) 101 - 111 mmol/L   CO2 26 22 - 32 mmol/L   Glucose, Bld 75 65 - 99 mg/dL   BUN 90 (H) 6 - 20 mg/dL   Creatinine, Ser 3.01 (H) 0.44 - 1.00 mg/dL   Calcium 9.0 8.9 - 10.3 mg/dL   GFR calc non Af Amer 17 (L) >60 mL/min   GFR calc Af Amer 20 (L) >60 mL/min    Comment: (NOTE) The eGFR has been calculated using the CKD EPI equation. This calculation has not been validated in all clinical situations. eGFR's persistently <60 mL/min signify possible Chronic Kidney Disease.    Anion gap 19 (H) 5 - 15    Ct Abdomen Pelvis Wo Contrast  06/23/2015   CLINICAL DATA:  Uncontrolled vomiting for 5 days  EXAM: CT ABDOMEN AND PELVIS WITHOUT CONTRAST  TECHNIQUE: Multidetector CT imaging of the abdomen and pelvis was performed following the standard protocol without IV contrast.  COMPARISON:  01/20/2015  FINDINGS: Subsegmental atelectasis at the left lung base. Severe cardiomegaly. AICD device.  Postcholecystectomy  The left lobe of the liver is prominent but there is no evidence of nodularity of the contour of the liver.  Spleen, pancreas, adrenal glands, and kidneys are within normal limits.  There is significant stranding throughout the subcutaneous fat across the abdomen.  Bladder and uterus are within normal limits. There is a 1.9 cm hyperdense area within the left adnexa of unknown significance. It may represent an ovarian complex cyst.  There is a small amount of free fluid layering in the pelvis. There is no obvious retroperitoneal adenopathy.  IMPRESSION: Small amount of free fluid in the pelvis is nonspecific.  1.9 cm hyperdense abnormality in the left adnexa may represent a lesion in the ovary. Pelvic ultrasound is warranted.  There is edema within the subcutaneous fat across the abdomen of unknown significance.  Left basilar  subsegmental atelectasis.   Electronically Signed   By: Arthur  Hoss M.D.   On: 06/23/2015 19:02   Dg Chest Portable 1 View  06/26/2015   CLINICAL DATA:    Bradycardia, CHF, chronic milrinone infusion, nausea and vomiting  EXAM: PORTABLE CHEST 1 VIEW  COMPARISON:  05/26/2015  FINDINGS: Right PICC line tip remains at the right innominate vein as before. Left subclavian multilead defibrillator/pacer evident. Heart is enlarged with vascular and interstitial prominence suggesting mild edema. Limited exam because of body habitus. Increased left base density suggest chronic atelectasis when compared to the prior CT.  IMPRESSION: Cardiomegaly with increased vascular and interstitial prominence suggesting mild developing edema pattern compare to 05/26/2015.   Electronically Signed   By: M.  Shick M.D.   On: 06/15/2015 17:25   Dg Abd Portable 1v  06/16/2015   CLINICAL DATA:  Status post nasogastric catheter placement  EXAM: PORTABLE ABDOMEN - 1 VIEW  COMPARISON:  None.  FINDINGS: Scattered large and small bowel gas is noted. A nasogastric catheter is noted deep within the stomach.  IMPRESSION: Nasogastric catheter within the stomach.   Electronically Signed   By: Mark  Lukens M.D.   On: 06/16/2015 09:28    ROS Blood pressure 115/56, pulse 76, temperature 97.4 F (36.3 C), temperature source Oral, resp. rate 16, height 5' 5" (1.651 m), weight 127.3 kg (280 lb 10.3 oz), last menstrual period 01/19/2014, SpO2 95 %. Physical Exam Physical Examination: General appearance -massive obesity, poorly responsive, only responds to loud voice and falls back asleep Mental status - inappropriate safety awareness, as above, moves all extrem to stim Eyes - pupils equal and reactive, extraocular eye movements intact, funduscopic exam normal, discs flat and sharp Mouth - mucous membranes moist, pharynx normal without lesions and NG in place Neck - adenopathy noted PCL Lymphatics - posterior cervical nodes Chest - rales noted  bibasilar, decreased air entry noted bilat Heart - normal rate, regular rhythm, normal S1, S2, no murmurs, rubs, clicks or gallops, normal rate and regular rhythm, systolic murmur Gr 2/6 at apex Abdomen - massive, decreased bs, liver down 7 cm,  Distended skin with edema Neurological - only responds to loud  Voice, falls asleep, moves all extrem , sym facies, normal eye movements to stim Extremities - pedal edema 3-4 + Skin - stasis changes LE, peau d orange skin on abdm  Assessment/Plan: 1 CKD 4 with AKI cardiorenal, r/o other source with GI prob currently  . NEeds vol off , k down, recheck K , ^ Furosemide, and eval of secondary prob.  2 NICM  Major issue but now exac, see if can improv hemodynamics 3 Arrhythmias per card 4. Anemia eval , some component renal 5. Metabolic Bone Disease: eval 6 Massive obesity 7 DM control 8 OSA P CRRT, line, lower K and vol , recheck K, UA, PTH, see if responds to higher dose Lasix  DETERDING,JAMES L 06/16/2015, 11:06 AM       

## 2015-06-16 NOTE — Procedures (Signed)
Hemodilaysis Catheter Insertion Procedure Note [name] [id number] [birth year]  Procedure: Insertion of  Hemodialysis Catheter Indications: CVVHD  Procedure Details Consent: Unable to obtain consent because of emergent medical necessity. Time Out: Verified patient identification, verified procedure, site/side was marked, verified correct patient position, special equipment/implants available, medications/allergies/relevent history reviewed, required imaging and test results available.  Performed  Maximum sterile technique was used including antiseptics, cap, gloves, gown, hand hygiene, mask and sheet. Skin prep: Chlorhexidine; local anesthetic administered A antimicrobial bonded/coated triple lumen trialysis HD catheter was placed in the left internal jugular vein using the Seldinger technique.  Evaluation Blood flow good Complications: No apparent complications Patient did tolerate procedure well. Chest X-ray ordered to verify placement.  CXR: pending.   Performed under direct MD supervision.  Performed using ultrasound guidance.  Wire visualized in vessel under ultrasound.   Placed by R. Celine Mans PA-C

## 2015-06-16 NOTE — Progress Notes (Signed)
CRITICAL VALUE ALERT  Critical value received:  Potassium 6.4  Date of notification:  06/16/15  Time of notification:  05:50  Critical value read back: yes  Nurse who received alert:  Leda Gauze  MD notified (1st page):  Bhagat  Time of first page:  06:30  Responding MD:   Iver Nestle  Time MD responded:  06:31  *New order to repeat potassium level

## 2015-06-16 NOTE — Progress Notes (Signed)
CRITICAL VALUE ALERT  Critical value received:  K 6.6  Date of notification:  06/16/2015  Time of notification:  1538  Critical value read back:Yes.    Nurse who received alert:  Malachi Pro RN  MD notified (1st page):  Dr. Darrick Penna  Time of first page:  1540  MD notified (2nd page):  Time of second page:  Responding MD:  Dr. Darrick Penna  Time MD responded:  [phone]

## 2015-06-16 NOTE — Progress Notes (Signed)
The patient's drowsiness has increased throughout the night. Patient was most alert when experiencing pain. Patient was too lethargic to hold herself up while sitting on the edge of the bed or stand. By Dennie Bible, patient was too lethargic to take PO meds and was only able to stay awake with constant prompting from nurse. Will continue to monitor.

## 2015-06-16 NOTE — Progress Notes (Signed)
CRITICAL VALUE ALERT  Critical value received:  K 7.2   Date of notification:  06/16/2015  Time of notification:  0930  Critical value read back:Yes.    Nurse who received alert:  Malachi Pro RN  MD notified (1st page):  Dr. Darrick Penna   Notified at that time. New orders received.

## 2015-06-16 NOTE — Progress Notes (Addendum)
Difficult to draw blood from PICC, unsure if this affected AM lab results (lab said blood sample was not hemolyzed). Repeat BMET to be drawn by lab peripherally. IV team consult for PICC line troubleshooting (unable to redraw coox until this is done).

## 2015-06-16 NOTE — Progress Notes (Signed)
eLink Physician-Brief Progress Note Patient Name: [name] DOB: Jul 21, 1966 MRN: [id number]   Date of Service  06/16/2015  HPI/Events of Note  K 6.8  eICU Interventions  Dc potassium/ aldactone Recheck to be ordered by rounding MD     Intervention Category Intermediate Interventions: Electrolyte abnormality - evaluation and management  ALVA,RAKESH V. 06/16/2015, 6:34 AM

## 2015-06-17 ENCOUNTER — Ambulatory Visit: Payer: Medicare Other | Admitting: Gastroenterology

## 2015-06-17 ENCOUNTER — Telehealth: Payer: Self-pay | Admitting: Gastroenterology

## 2015-06-17 ENCOUNTER — Encounter: Payer: Self-pay | Admitting: Gastroenterology

## 2015-06-17 ENCOUNTER — Other Ambulatory Visit (HOSPITAL_COMMUNITY): Payer: Self-pay | Admitting: Internal Medicine

## 2015-06-17 DIAGNOSIS — N179 Acute kidney failure, unspecified: Secondary | ICD-10-CM

## 2015-06-17 LAB — PHOSPHORUS: PHOSPHORUS: 4.1 mg/dL (ref 2.5–4.6)

## 2015-06-17 LAB — COMPREHENSIVE METABOLIC PANEL
ALBUMIN: 3 g/dL — AB (ref 3.5–5.0)
ALT: 96 U/L — ABNORMAL HIGH (ref 14–54)
ANION GAP: 12 (ref 5–15)
AST: 188 U/L — ABNORMAL HIGH (ref 15–41)
Alkaline Phosphatase: 127 U/L — ABNORMAL HIGH (ref 38–126)
BILIRUBIN TOTAL: 2.6 mg/dL — AB (ref 0.3–1.2)
BUN: 51 mg/dL — ABNORMAL HIGH (ref 6–20)
CO2: 29 mmol/L (ref 22–32)
Calcium: 8.5 mg/dL — ABNORMAL LOW (ref 8.9–10.3)
Chloride: 90 mmol/L — ABNORMAL LOW (ref 101–111)
Creatinine, Ser: 1.9 mg/dL — ABNORMAL HIGH (ref 0.44–1.00)
GFR, EST AFRICAN AMERICAN: 35 mL/min — AB (ref >60)
GFR, EST NON AFRICAN AMERICAN: 30 mL/min — AB (ref >60)
Glucose, Bld: 107 mg/dL — ABNORMAL HIGH (ref 65–99)
POTASSIUM: 3.9 mmol/L (ref 3.5–5.1)
Sodium: 131 mmol/L — ABNORMAL LOW (ref 135–145)
TOTAL PROTEIN: 6.9 g/dL (ref 6.5–8.1)

## 2015-06-17 LAB — CARBOXYHEMOGLOBIN
Carboxyhemoglobin: 2.2 % — ABNORMAL HIGH (ref 0.5–1.5)
METHEMOGLOBIN: 0.7 % (ref 0.0–1.5)
O2 Saturation: 70.4 %
TOTAL HEMOGLOBIN: 11.6 g/dL — AB (ref 12.0–16.0)

## 2015-06-17 LAB — POCT ACTIVATED CLOTTING TIME
ACTIVATED CLOTTING TIME: 202 s
ACTIVATED CLOTTING TIME: 171 s
ACTIVATED CLOTTING TIME: 196 s
ACTIVATED CLOTTING TIME: 190 s
ACTIVATED CLOTTING TIME: 202 s
ACTIVATED CLOTTING TIME: 196 s
ACTIVATED CLOTTING TIME: 208 s
ACTIVATED CLOTTING TIME: 214 s
ACTIVATED CLOTTING TIME: 196 s
ACTIVATED CLOTTING TIME: 196 s
Activated Clotting Time: 178 seconds
Activated Clotting Time: 183 seconds
Activated Clotting Time: 214 seconds
Activated Clotting Time: 184 seconds

## 2015-06-17 LAB — RENAL FUNCTION PANEL
Albumin: 2.8 g/dL — ABNORMAL LOW (ref 3.5–5.0)
Anion gap: 7 (ref 5–15)
BUN: 37 mg/dL — AB (ref 6–20)
CHLORIDE: 99 mmol/L — AB (ref 101–111)
CO2: 29 mmol/L (ref 22–32)
CREATININE: 1.6 mg/dL — AB (ref 0.44–1.00)
Calcium: 8.2 mg/dL — ABNORMAL LOW (ref 8.9–10.3)
GFR calc Af Amer: 43 mL/min — ABNORMAL LOW (ref >60)
GFR, EST NON AFRICAN AMERICAN: 37 mL/min — AB (ref >60)
GLUCOSE: 180 mg/dL — AB (ref 65–99)
POTASSIUM: 3.9 mmol/L (ref 3.5–5.1)
Phosphorus: 2.8 mg/dL (ref 2.5–4.6)
Sodium: 135 mmol/L (ref 135–145)

## 2015-06-17 LAB — GLUCOSE, CAPILLARY
GLUCOSE-CAPILLARY: 107 mg/dL — AB (ref 65–99)
GLUCOSE-CAPILLARY: 110 mg/dL — AB (ref 65–99)
GLUCOSE-CAPILLARY: 115 mg/dL — AB (ref 65–99)
GLUCOSE-CAPILLARY: 145 mg/dL — AB (ref 65–99)
Glucose-Capillary: 104 mg/dL — ABNORMAL HIGH (ref 65–99)
Glucose-Capillary: 105 mg/dL — ABNORMAL HIGH (ref 65–99)
Glucose-Capillary: 113 mg/dL — ABNORMAL HIGH (ref 65–99)

## 2015-06-17 LAB — POCT I-STAT 3, ART BLOOD GAS (G3+)
Acid-Base Excess: 5 mmol/L — ABNORMAL HIGH (ref 0.0–2.0)
Bicarbonate: 30.7 mEq/L — ABNORMAL HIGH (ref 20.0–24.0)
O2 SAT: 96 %
PCO2 ART: 48.3 mmHg — AB (ref 35.0–45.0)
PH ART: 7.409 (ref 7.350–7.450)
Patient temperature: 97.8
TCO2: 32 mmol/L (ref 0–100)
pO2, Arterial: 84 mmHg (ref 80.0–100.0)

## 2015-06-17 LAB — CBC
HCT: 36.7 % (ref 36.0–46.0)
HEMOGLOBIN: 11.5 g/dL — AB (ref 12.0–15.0)
MCH: 29.9 pg (ref 26.0–34.0)
MCHC: 31.3 g/dL (ref 30.0–36.0)
MCV: 95.6 fL (ref 78.0–100.0)
Platelets: 282 10*3/uL (ref 150–400)
RBC: 3.84 MIL/uL — AB (ref 3.87–5.11)
RDW: 17.2 % — ABNORMAL HIGH (ref 11.5–15.5)
WBC: 6.3 10*3/uL (ref 4.0–10.5)

## 2015-06-17 LAB — MAGNESIUM: MAGNESIUM: 2.5 mg/dL — AB (ref 1.7–2.4)

## 2015-06-17 LAB — AMMONIA: AMMONIA: 32 umol/L (ref 9–35)

## 2015-06-17 LAB — APTT: aPTT: 60 seconds — ABNORMAL HIGH (ref 24–37)

## 2015-06-17 LAB — PARATHYROID HORMONE, INTACT (NO CA): PTH: 214 pg/mL — AB (ref 15–65)

## 2015-06-17 NOTE — Progress Notes (Addendum)
Patient ID: [name], female   DOB: Mar 14, 1966, 49 y.o.   MRN: [id number]   SUBJECTIVE: Patient was admitted with nausea, vomiting, and weight gain. Pulmonary edema on CXR.    Yesterday started on CVVHD. CVP down to 17 from 30.  Drowsy. Opens eyes and answers questions.    Scheduled Meds: . allopurinol  300 mg Oral Daily  . amiodarone  200 mg Oral BID  . antiseptic oral rinse  7 mL Mouth Rinse 6 times per day  . aspirin  81 mg Per Tube Daily  . gabapentin  600 mg Per Tube QID  . heparin subcutaneous  5,000 Units Subcutaneous 3 times per day  . hydrALAZINE  12.5 mg Oral 3 times per day  . Influenza vac split quadrivalent PF  0.5 mL Intramuscular Tomorrow-1000  . insulin aspart  0-15 Units Subcutaneous TID WC  . magnesium oxide  400 mg Oral BID  . metolazone  5 mg Oral BID  . pantoprazole sodium  40 mg Per Tube Daily  . ranolazine  500 mg Oral BID  . sildenafil  40 mg Oral TID   Continuous Infusions: . heparin 10,000 units/ 20 mL infusion syringe 1,000 Units/hr (06/17/15 0600)  . milrinone 0.375 mcg/kg/min (06/17/15 0343)  . dialysis replacement fluid (prismasate) 800 mL/hr at 06/17/15 0550  . dialysis replacement fluid (prismasate) 700 mL/hr at 06/17/15 0645  . dialysate (PRISMASATE) 2,000 mL/hr at 06/17/15 0712   PRN Meds:.albuterol, cyclobenzaprine, heparin, heparin, ondansetron (ZOFRAN) IV, oxyCODONE-acetaminophen **AND** oxyCODONE, sodium chloride    Filed Vitals:   06/17/15 0530 06/17/15 0600 06/17/15 0630 06/17/15 0700  BP: 99/48 97/48 95/50  100/61  Pulse:      Temp:      TempSrc:      Resp: 12 14 14 13   Height:      Weight:      SpO2: 98% 100% 99% 100%    Intake/Output Summary (Last 24 hours) at 06/17/15 0715 Last data filed at 06/17/15 0700  Gross per 24 hour  Intake 1042.87 ml  Output   2842 ml  Net -1799.13 ml    LABS: Basic Metabolic Panel:  Recent Labs  01/41/03 0418  06/16/15 2203 06/17/15 0450  NA 134*  < > 131* 131*  K 3.9  < > 3.4*  3.9  CL 88*  < > 90* 90*  CO2 36*  < > 28 29  GLUCOSE 55*  < > 126* 107*  BUN 85*  < > 55* 51*  CREATININE 2.32*  < > 1.85* 1.90*  CALCIUM 8.8*  < > 8.9 8.5*  MG 2.2  --   --  2.5*  PHOS  --   < > 3.7 4.1  < > = values in this interval not displayed. Liver Function Tests:  Recent Labs  06/19/2015 1653  06/16/15 2203 06/17/15 0450  AST 80*  --   --  188*  ALT 42  --   --  96*  ALKPHOS 121  --   --  127*  BILITOT 2.4*  --   --  2.6*  PROT 8.3*  --   --  6.9  ALBUMIN 3.6  < > 3.5 3.0*  < > = values in this interval not displayed.  Recent Labs  06/15/2015 1653  LIPASE 35   CBC:  Recent Labs  06/16/15 0513 06/17/15 0450  WBC 5.2 6.3  HGB 11.8* 11.5*  HCT 38.1 36.7  MCV 96.0 95.6  PLT 280 282   Cardiac Enzymes:  Recent Labs  06/27/2015 1645  TROPONINI <0.03   BNP: Invalid input(s): POCBNP D-Dimer: No results for input(s): DDIMER in the last 72 hours. Hemoglobin A1C:  Recent Labs  06/30/2015 1653  HGBA1C 6.6*   Fasting Lipid Panel: No results for input(s): CHOL, HDL, LDLCALC, TRIG, CHOLHDL, LDLDIRECT in the last 72 hours. Thyroid Function Tests: No results for input(s): TSH, T4TOTAL, T3FREE, THYROIDAB in the last 72 hours.  Invalid input(s): FREET3 Anemia Panel:  Recent Labs  06/16/15 1228  TIBC 417  IRON 22*    RADIOLOGY: Ct Abdomen Pelvis Wo Contrast  06/15/2015   CLINICAL DATA:  Uncontrolled vomiting for 5 days  EXAM: CT ABDOMEN AND PELVIS WITHOUT CONTRAST  TECHNIQUE: Multidetector CT imaging of the abdomen and pelvis was performed following the standard protocol without IV contrast.  COMPARISON:  01/20/2015  FINDINGS: Subsegmental atelectasis at the left lung base. Severe cardiomegaly. AICD device.  Postcholecystectomy  The left lobe of the liver is prominent but there is no evidence of nodularity of the contour of the liver.  Spleen, pancreas, adrenal glands, and kidneys are within normal limits.  There is significant stranding throughout the  subcutaneous fat across the abdomen.  Bladder and uterus are within normal limits. There is a 1.9 cm hyperdense area within the left adnexa of unknown significance. It may represent an ovarian complex cyst.  There is a small amount of free fluid layering in the pelvis. There is no obvious retroperitoneal adenopathy.  IMPRESSION: Small amount of free fluid in the pelvis is nonspecific.  1.9 cm hyperdense abnormality in the left adnexa may represent a lesion in the ovary. Pelvic ultrasound is warranted.  There is edema within the subcutaneous fat across the abdomen of unknown significance.  Left basilar subsegmental atelectasis.   Electronically Signed   By: Jolaine Click M.D.   On: 06/24/2015 19:02   Dg Chest Portable 1 View  06/16/2015   CLINICAL DATA:  49 year old female central line placement. Initial encounter.  EXAM: PORTABLE CHEST 1 VIEW  COMPARISON:  06/21/2015 and earlier.  FINDINGS: Portable AP semi upright view at 1154 hours. Continued low lung volumes. Pulmonary vascular congestion has regressed. Stable left chest cardiac AICD. Stable cardiac size and mediastinal contours.  Right PICC line remains in place, tip projects at the level of the medial right subclavian vein. An enteric tube is now in place in courses to the epigastrium, tip not included. There is a new left IJ approach dual lumen dialysis type catheter. Catheter tips project at the cavoatrial junction level. No pneumothorax. Continued retrocardiac opacity.  IMPRESSION: 1. Left IJ approach dual lumen dialysis type catheter placed with no adverse features. 2. Enteric tube placed, courses to the abdomen tip not included. 3. Stable right PICC line. 4. Interval regressed pulmonary edema.   Electronically Signed   By: Odessa Fleming M.D.   On: 06/16/2015 12:04   Dg Chest Portable 1 View  06/17/2015   CLINICAL DATA:  Bradycardia, CHF, chronic milrinone infusion, nausea and vomiting  EXAM: PORTABLE CHEST 1 VIEW  COMPARISON:  05/26/2015  FINDINGS: Right  PICC line tip remains at the right innominate vein as before. Left subclavian multilead defibrillator/pacer evident. Heart is enlarged with vascular and interstitial prominence suggesting mild edema. Limited exam because of body habitus. Increased left base density suggest chronic atelectasis when compared to the prior CT.  IMPRESSION: Cardiomegaly with increased vascular and interstitial prominence suggesting mild developing edema pattern compare to 05/26/2015.   Electronically Signed   By: Judie Petit.  Shick M.D.   On: July 06, 2015 17:25   Dg Chest Port 1 View  05/26/2015   CLINICAL DATA:  Shortness of breath and syncope 1 day.  EXAM: PORTABLE CHEST - 1 VIEW  COMPARISON:  05/16/2015  FINDINGS: Right-sided PICC line has been pulled back slightly with tip overlying the region of the SVC near the junction of the subclavian vein. Left-sided pacemaker unchanged.  Lungs are hypoinflated with mild prominence of the perihilar markings suggesting mild vascular congestion. There is mild stable cardiomegaly. Remainder of the exam is unchanged.  IMPRESSION: Stable mild cardiomegaly with evidence of mild vascular congestion.  Right-sided PICC line has been pulled back slightly with tip over the region of the SVC near the junction with the subclavian vein.   Electronically Signed   By: Elberta Fortis M.D.   On: 05/26/2015 21:02   Dg Abd Portable 1v  06/16/2015   CLINICAL DATA:  Status post nasogastric catheter placement  EXAM: PORTABLE ABDOMEN - 1 VIEW  COMPARISON:  None.  FINDINGS: Scattered large and small bowel gas is noted. A nasogastric catheter is noted deep within the stomach.  IMPRESSION: Nasogastric catheter within the stomach.   Electronically Signed   By: Alcide Clever M.D.   On: 06/16/2015 09:28    PHYSICAL EXAM CVP 17  General: NAD. In bed.  Neck: Thick, JVP difficult but appears elevated, no thyromegaly or thyroid nodule.  Lungs: Clear to auscultation bilaterally with normal respiratory effort. CV: Nondisplaced  PMI.  Heart regular S1/S2, no S3/S4, no murmur.  2+ edema to knees.   Abdomen: Soft, nontender, no hepatosplenomegaly, mild distention.  Neurologic: Alert and oriented x 3.  Psych: Drowsy. Opens eyes. Normal affect. Extremities: No clubbing or cyanosis.   TELEMETRY: Reviewed telemetry pt in NSR with BiV pacing  ASSESSMENT AND PLAN: 1. Acute on chronic systolic CHF: Nonischemic cardiomyopathy, s/p [employer] CRT-D. EF 30-35% (03/2015 - on milrinone). Now on home milrinone 0.375 mcg/kg/min. NYHA class IIIb symptoms chronically. She was admitted with elevated weight, nausea/vomiting, and pulmonary edema on CXR.  Nausea may be due to GI congestion.  Volume has been very difficult to control at home despite large diuretic doses.   - Nephrology consulted. Started on CVVHD. CVP coming down. Off Lasix, stop metolazone.  - Continue milrinone 0.375 mcg/kg/min. Today's CO-OX 70%. Stop hydralazine with soft BP.  - Our team was unable to offer her an LVAD due to concerns over an uncertain level of social support and also size/weight. Referred her to [facility] for 2nd opinion but she had not gone yet and I do not think that this is going to be an option at this point.  Unfortunately, nearing the point where we are out of good options.   2. OSA, suspected OHS: Using CPAP at night and oxygen during the day.   3. VT/VF, recurrent: Ranolazine 500 mg bid and amiodarone 200 mg bid to continue until she follows up with Dr [name]. Her tremor may be related to amiodarone, but will need to continue this for now given issues with recurrent VT.Off ranexa until more awake and can swallow. NG tube in place.   4. H/O GI bleed with AVM: No evidence of recurrent bleeding even with recent coumadin trial.  5. Pulmonary HTN: RHC 05/01/15 PVR 4.0, improved. Continue current dose of sildenafil.  6. AKI on CKD: As above, suspect advancing cardiorenal syndrome.  Started on CVVHD.      CLEGG,[name] NP-C  06/17/2015 7:15 AM    Patient seen with  NP, agree with the above note.  She remains critically ill.  Stabilized now on CVVH with resolution of hyperkalemia.  She has some ongoing urine output and continuing UF with CVVH. CVP coming down.  Co-ox remains good at 70%.  Will use CVVH to remove volume, but will need to wean when more euvolemic.  She would be a poor long-term HD candidate with inotrope dependence.    She remains lethargic.  Will send ammonia level today and ABG (though doubt CO2 narcosis with normal bicarbonate level).   Need decision-making on code status, etc.  However, she is too drowsy and no family has been available.   35 minutes critical care time.   [name] 06/17/2015 7:52 AM

## 2015-06-17 NOTE — Progress Notes (Signed)
Subjective: Interval History: has no complaint , feels some better.  Objective: Vital signs in last 24 hours: Temp:  [92.1 F (33.4 C)-97.7 F (36.5 C)] 97.7 F (36.5 C) (10/06 0800) Resp:  [10-17] 14 (10/06 0800) BP: (95-136)/(48-88) 103/67 mmHg (10/06 0800) SpO2:  [92 %-100 %] 100 % (10/06 0800) Weight:  [125.5 kg (276 lb 10.8 oz)] 125.5 kg (276 lb 10.8 oz) (10/06 0311) Weight change: -1.8 kg (-3 lb 15.5 oz)  Intake/Output from previous day: 10/05 0701 - 10/06 0700 In: 1042.9 [I.V.:647.9; NG/GT:395] Out: 2842 [Urine:1215] Intake/Output this shift: Total I/O In: 14.3 [I.V.:14.3] Out: 119 [Urine:60; Other:59]  General appearance: cooperative, morbidly obese and slowed mentation Neck: IJ cath Resp: diminished breath sounds bilaterally and rales bibasilar Cardio: S1, S2 normal and systolic murmur: holosystolic 2/6, blowing at apex GI: obese, pos bs, not tender, liver down 6 cm Extremities: edema 2-3+  Lab Results:  Recent Labs  06/16/15 0513 06/17/15 0450  WBC 5.2 6.3  HGB 11.8* 11.5*  HCT 38.1 36.7  PLT 280 282   BMET:  Recent Labs  06/16/15 2203 06/17/15 0450  NA 131* 131*  K 3.4* 3.9  CL 90* 90*  CO2 28 29  GLUCOSE 126* 107*  BUN 55* 51*  CREATININE 1.85* 1.90*  CALCIUM 8.9 8.5*    Recent Labs  06/16/15 1228  PTH 214*   Iron Studies:  Recent Labs  06/16/15 1228  IRON 22*  TIBC 417    Studies/Results: Dg Chest Portable 1 View  06/16/2015   CLINICAL DATA:  49 year old female central line placement. Initial encounter.  EXAM: PORTABLE CHEST 1 VIEW  COMPARISON:  Jun 19, 2015 and earlier.  FINDINGS: Portable AP semi upright view at 1154 hours. Continued low lung volumes. Pulmonary vascular congestion has regressed. Stable left chest cardiac AICD. Stable cardiac size and mediastinal contours.  Right PICC line remains in place, tip projects at the level of the medial right subclavian vein. An enteric tube is now in place in courses to the epigastrium,  tip not included. There is a new left IJ approach dual lumen dialysis type catheter. Catheter tips project at the cavoatrial junction level. No pneumothorax. Continued retrocardiac opacity.  IMPRESSION: 1. Left IJ approach dual lumen dialysis type catheter placed with no adverse features. 2. Enteric tube placed, courses to the abdomen tip not included. 3. Stable right PICC line. 4. Interval regressed pulmonary edema.   Electronically Signed   By: Odessa Fleming M.D.   On: 06/16/2015 12:04   Dg Abd Portable 1v  06/16/2015   CLINICAL DATA:  Status post nasogastric catheter placement  EXAM: PORTABLE ABDOMEN - 1 VIEW  COMPARISON:  None.  FINDINGS: Scattered large and small bowel gas is noted. A nasogastric catheter is noted deep within the stomach.  IMPRESSION: Nasogastric catheter within the stomach.   Electronically Signed   By: Alcide Clever M.D.   On: 06/16/2015 09:28    I have reviewed the patient's current medications.  Assessment/Plan: 1 AKI vol improving . tol removal. K /acid/base improved.  Mechanics ok. Will try to ^ off vol, still on Lasix 2 Anemia stable 3 CM per cards on inotrope 4 DM controlled 5 obesity 6 MR 7 AVMS P CRRT, ^ net neg, inotropes, cont lasix, lower DFR   LOS: 3 days   [name] L 06/17/2015,8:22 AM

## 2015-06-17 NOTE — Telephone Encounter (Signed)
PATIENT WAS A NO SHOW AND LETTER SENT  °

## 2015-06-17 NOTE — Care Management Important Message (Signed)
Important Message  Patient Details  Name: [name] MRN: [id number] Date of Birth: [birth year]   Medicare Important Message Given:  Yes-second notification given    Orson Aloe 06/17/2015, 11:56 AM

## 2015-06-18 LAB — POCT ACTIVATED CLOTTING TIME
ACTIVATED CLOTTING TIME: 189 s
ACTIVATED CLOTTING TIME: 196 s
ACTIVATED CLOTTING TIME: 202 s
ACTIVATED CLOTTING TIME: 208 s
ACTIVATED CLOTTING TIME: 214 s
ACTIVATED CLOTTING TIME: 239 s
Activated Clotting Time: 183 seconds
Activated Clotting Time: 221 seconds
Activated Clotting Time: 190 seconds
Activated Clotting Time: 227 seconds
Activated Clotting Time: 190 seconds
Activated Clotting Time: 208 seconds
Activated Clotting Time: 190 seconds

## 2015-06-18 LAB — CARBOXYHEMOGLOBIN
Carboxyhemoglobin: 2.1 % — ABNORMAL HIGH (ref 0.5–1.5)
Methemoglobin: 0.8 % (ref 0.0–1.5)
O2 SAT: 75.3 %
Total hemoglobin: 11.9 g/dL — ABNORMAL LOW (ref 12.0–16.0)

## 2015-06-18 LAB — RENAL FUNCTION PANEL
ALBUMIN: 2.8 g/dL — AB (ref 3.5–5.0)
ALBUMIN: 2.8 g/dL — AB (ref 3.5–5.0)
Anion gap: 11 (ref 5–15)
Anion gap: 9 (ref 5–15)
BUN: 27 mg/dL — AB (ref 6–20)
BUN: 21 mg/dL — ABNORMAL HIGH (ref 6–20)
CALCIUM: 7.9 mg/dL — AB (ref 8.9–10.3)
CHLORIDE: 97 mmol/L — AB (ref 101–111)
CO2: 25 mmol/L (ref 22–32)
CO2: 29 mmol/L (ref 22–32)
CREATININE: 1.29 mg/dL — AB (ref 0.44–1.00)
Calcium: 8.2 mg/dL — ABNORMAL LOW (ref 8.9–10.3)
Chloride: 98 mmol/L — ABNORMAL LOW (ref 101–111)
Creatinine, Ser: 1.26 mg/dL — ABNORMAL HIGH (ref 0.44–1.00)
GFR calc Af Amer: 57 mL/min — ABNORMAL LOW (ref >60)
GFR calc non Af Amer: 49 mL/min — ABNORMAL LOW (ref >60)
GFR, EST AFRICAN AMERICAN: 55 mL/min — AB (ref >60)
GFR, EST NON AFRICAN AMERICAN: 48 mL/min — AB (ref >60)
GLUCOSE: 105 mg/dL — AB (ref 65–99)
Glucose, Bld: 168 mg/dL — ABNORMAL HIGH (ref 65–99)
PHOSPHORUS: 2.3 mg/dL — AB (ref 2.5–4.6)
PHOSPHORUS: 4.1 mg/dL (ref 2.5–4.6)
POTASSIUM: 4 mmol/L (ref 3.5–5.1)
Potassium: 3.7 mmol/L (ref 3.5–5.1)
SODIUM: 134 mmol/L — AB (ref 135–145)
SODIUM: 135 mmol/L (ref 135–145)

## 2015-06-18 LAB — GLUCOSE, CAPILLARY
GLUCOSE-CAPILLARY: 95 mg/dL (ref 65–99)
Glucose-Capillary: 107 mg/dL — ABNORMAL HIGH (ref 65–99)
Glucose-Capillary: 101 mg/dL — ABNORMAL HIGH (ref 65–99)
Glucose-Capillary: 106 mg/dL — ABNORMAL HIGH (ref 65–99)
Glucose-Capillary: 95 mg/dL (ref 65–99)

## 2015-06-18 LAB — BASIC METABOLIC PANEL
Anion gap: 12 (ref 5–15)
BUN: 27 mg/dL — ABNORMAL HIGH (ref 6–20)
CALCIUM: 8.2 mg/dL — AB (ref 8.9–10.3)
CO2: 25 mmol/L (ref 22–32)
CREATININE: 1.29 mg/dL — AB (ref 0.44–1.00)
Chloride: 97 mmol/L — ABNORMAL LOW (ref 101–111)
GFR calc non Af Amer: 48 mL/min — ABNORMAL LOW (ref >60)
GFR, EST AFRICAN AMERICAN: 55 mL/min — AB (ref >60)
Glucose, Bld: 106 mg/dL — ABNORMAL HIGH (ref 65–99)
Potassium: 3.9 mmol/L (ref 3.5–5.1)
SODIUM: 134 mmol/L — AB (ref 135–145)

## 2015-06-18 LAB — CBC
HCT: 37.2 % (ref 36.0–46.0)
Hemoglobin: 11.8 g/dL — ABNORMAL LOW (ref 12.0–15.0)
MCH: 30.5 pg (ref 26.0–34.0)
MCHC: 31.7 g/dL (ref 30.0–36.0)
MCV: 96.1 fL (ref 78.0–100.0)
PLATELETS: 275 10*3/uL (ref 150–400)
RBC: 3.87 MIL/uL (ref 3.87–5.11)
RDW: 17.1 % — ABNORMAL HIGH (ref 11.5–15.5)
WBC: 8.4 10*3/uL (ref 4.0–10.5)

## 2015-06-18 LAB — POCT I-STAT EG7
ACID-BASE EXCESS: 3 mmol/L — AB (ref 0.0–2.0)
BICARBONATE: 29.9 meq/L — AB (ref 20.0–24.0)
CALCIUM ION: 1.04 mmol/L — AB (ref 1.12–1.23)
HCT: 51 % — ABNORMAL HIGH (ref 36.0–46.0)
HEMOGLOBIN: 17.3 g/dL — AB (ref 12.0–15.0)
O2 SAT: 58 %
PH VEN: 7.388 — AB (ref 7.250–7.300)
Potassium: 3.9 mmol/L (ref 3.5–5.1)
SODIUM: 138 mmol/L (ref 135–145)
TCO2: 31 mmol/L (ref 0–100)
pCO2, Ven: 49.5 mmHg (ref 45.0–50.0)
pO2, Ven: 31 mmHg (ref 30.0–45.0)

## 2015-06-18 LAB — MAGNESIUM: Magnesium: 2.6 mg/dL — ABNORMAL HIGH (ref 1.7–2.4)

## 2015-06-18 LAB — APTT: aPTT: 78 seconds — ABNORMAL HIGH (ref 24–37)

## 2015-06-18 MED ORDER — PANTOPRAZOLE SODIUM 40 MG PO TBEC
40.0000 mg | DELAYED_RELEASE_TABLET | Freq: Every day | ORAL | Status: DC
  Administered 2015-06-18 – 2015-06-24 (×7): 40 mg via ORAL
  Filled 2015-06-18 (×7): qty 1

## 2015-06-18 MED ORDER — GABAPENTIN 300 MG PO CAPS
600.0000 mg | ORAL_CAPSULE | Freq: Four times a day (QID) | ORAL | Status: DC
  Administered 2015-06-18 – 2015-06-21 (×12): 600 mg via ORAL
  Filled 2015-06-18 (×10): qty 2
  Filled 2015-06-18: qty 6
  Filled 2015-06-18: qty 2

## 2015-06-18 MED ORDER — INFLUENZA VAC SPLIT QUAD 0.5 ML IM SUSY: 0.5000 mL | PREFILLED_SYRINGE | INTRAMUSCULAR | Status: AC | PRN

## 2015-06-18 MED ORDER — SODIUM PHOSPHATE 3 MMOLE/ML IV SOLN
20.0000 mmol | Freq: Once | INTRAVENOUS | Status: AC
  Administered 2015-06-18: 20 mmol via INTRAVENOUS
  Filled 2015-06-18: qty 6.67

## 2015-06-18 MED ORDER — SODIUM CHLORIDE 0.9 % IJ SOLN
10.0000 mL | INTRAMUSCULAR | Status: DC | PRN
Start: 2015-06-18 — End: 2015-06-26

## 2015-06-18 MED ORDER — ASPIRIN EC 81 MG PO TBEC
81.0000 mg | DELAYED_RELEASE_TABLET | Freq: Every day | ORAL | Status: DC
  Administered 2015-06-19 – 2015-06-24 (×6): 81 mg via ORAL
  Filled 2015-06-18 (×7): qty 1

## 2015-06-18 MED ORDER — FUROSEMIDE 80 MG PO TABS
160.0000 mg | ORAL_TABLET | Freq: Four times a day (QID) | ORAL | Status: DC
  Administered 2015-06-18 – 2015-06-24 (×25): 160 mg via ORAL
  Filled 2015-06-18 (×25): qty 2

## 2015-06-18 MED ORDER — SODIUM CHLORIDE 0.9 % IJ SOLN
10.0000 mL | Freq: Two times a day (BID) | INTRAMUSCULAR | Status: DC
  Administered 2015-06-18 – 2015-06-25 (×11): 10 mL

## 2015-06-18 NOTE — Progress Notes (Signed)
Subjective: Interval History: has no complaint.  Objective: Vital signs in last 24 hours: Temp:  [97.8 F (36.6 C)-98.6 F (37 C)] 97.8 F (36.6 C) (10/07 0400) Pulse Rate:  [79-83] 83 (10/06 1600) Resp:  [12-19] 16 (10/07 0700) BP: (87-119)/(49-86) 104/76 mmHg (10/07 0700) SpO2:  [90 %-100 %] 100 % (10/07 0700) Weight:  [120.6 kg (265 lb 14 oz)] 120.6 kg (265 lb 14 oz) (10/07 0321) Weight change: -4.9 kg (-10 lb 12.8 oz)  Intake/Output from previous day: 10/06 0701 - 10/07 0700 In: 548.9 [I.V.:328.9; NG/GT:210] Out: 3735 [Urine:1115] Intake/Output this shift: Total I/O In: 14.3 [I.V.:14.3] Out: 190 [Urine:20; Other:170]  General appearance: morbidly obese, slowed mentation and perseverates Neck: IJ cath Resp: diminished breath sounds bilaterally and rales bibasilar Cardio: S1, S2 normal and systolic murmur: holosystolic 2/6, blowing at apex GI: soft, non-tender; bowel sounds normal; no masses,  no organomegaly Extremities: edema 3+   abdm liver down 7 cm Lab Results:  Recent Labs  06/17/15 0450 06/18/15 0400  WBC 6.3 8.4  HGB 11.5* 11.8*  HCT 36.7 37.2  PLT 282 275   BMET:  Recent Labs  06/18/15 0400 06/18/15 0435  NA 134* 134*  K 3.9 4.0  CL 97* 98*  CO2 25 25  GLUCOSE 106* 105*  BUN 27* 27*  CREATININE 1.29* 1.26*  CALCIUM 8.2* 8.2*    Recent Labs  06/16/15 1228  PTH 214*   Iron Studies:  Recent Labs  06/16/15 1228  IRON 22*  TIBC 417    Studies/Results: Dg Chest Portable 1 View  06/16/2015   CLINICAL DATA:  49 year old female central line placement. Initial encounter.  EXAM: PORTABLE CHEST 1 VIEW  COMPARISON:  06/24/2015 and earlier.  FINDINGS: Portable AP semi upright view at 1154 hours. Continued low lung volumes. Pulmonary vascular congestion has regressed. Stable left chest cardiac AICD. Stable cardiac size and mediastinal contours.  Right PICC line remains in place, tip projects at the level of the medial right subclavian vein. An  enteric tube is now in place in courses to the epigastrium, tip not included. There is a new left IJ approach dual lumen dialysis type catheter. Catheter tips project at the cavoatrial junction level. No pneumothorax. Continued retrocardiac opacity.  IMPRESSION: 1. Left IJ approach dual lumen dialysis type catheter placed with no adverse features. 2. Enteric tube placed, courses to the abdomen tip not included. 3. Stable right PICC line. 4. Interval regressed pulmonary edema.   Electronically Signed   By: Odessa Fleming M.D.   On: 06/16/2015 12:04   Dg Abd Portable 1v  06/16/2015   CLINICAL DATA:  Status post nasogastric catheter placement  EXAM: PORTABLE ABDOMEN - 1 VIEW  COMPARISON:  None.  FINDINGS: Scattered large and small bowel gas is noted. A nasogastric catheter is noted deep within the stomach.  IMPRESSION: Nasogastric catheter within the stomach.   Electronically Signed   By: Alcide Clever M.D.   On: 06/16/2015 09:28    I have reviewed the patient's current medications.  Assessment/Plan: 1 AKI vol xs.  Acid/base/solute ok.  Need to give phos.  bp marginal but tol 150/h off.  Will consider ^ if stable.  Needs 8-10 liters more.  Will see if can make urine, not sure why Lasix stopped. 2 Anemia mildly low Fe 3 Obesity  4 OSA 5 CM per cards 6 MR  7 DM controlled 8 DJD 9 confusion P CRRt, lower vol, give phos , restart lasix,    LOS: 4 days  Rimsha Trembley L 06/18/2015,8:13 AM

## 2015-06-18 NOTE — Progress Notes (Signed)
Pt pulled NGT,pt more awake and able to drink water,less drowsy.

## 2015-06-18 NOTE — Progress Notes (Signed)
Patient ID: [name], female   DOB: 10/26/1965, 49 y.o.   MRN: [id number]   SUBJECTIVE: Patient was admitted with nausea, vomiting, and weight gain. Pulmonary edema on CXR.    Remains on CVVHD at ~ 150 ml/hr. CVP 15. Placed on po lasix 160 mg q6 by Dr Dedderding. Has dark yellow urine in foley bag.   Still drowsy but eyes open spontaneously and alert to person and place.   Scheduled Meds: . allopurinol  300 mg Oral Daily  . amiodarone  200 mg Oral BID  . antiseptic oral rinse  7 mL Mouth Rinse 6 times per day  . aspirin EC  81 mg Oral Daily  . furosemide  160 mg Oral Q6H  . gabapentin  600 mg Oral QID  . heparin subcutaneous  5,000 Units Subcutaneous 3 times per day  . insulin aspart  0-15 Units Subcutaneous TID WC  . magnesium oxide  400 mg Oral BID  . pantoprazole  40 mg Oral Daily  . ranolazine  500 mg Oral BID  . sildenafil  40 mg Oral TID  . sodium chloride  10-40 mL Intracatheter Q12H  . sodium phosphate  Dextrose 5% IVPB  20 mmol Intravenous Once   Continuous Infusions: . heparin 10,000 units/ 20 mL infusion syringe 950 Units/hr (06/18/15 1400)  . milrinone 0.375 mcg/kg/min (06/18/15 1400)  . dialysis replacement fluid (prismasate) 800 mL/hr at 06/18/15 1402  . dialysis replacement fluid (prismasate) 700 mL/hr at 06/18/15 1220  . dialysate (PRISMASATE) 1,500 mL/hr at 06/18/15 1331   PRN Meds:.albuterol, cyclobenzaprine, heparin, heparin, [START ON 06/19/2015] Influenza vac split quadrivalent PF, ondansetron (ZOFRAN) IV, oxyCODONE-acetaminophen **AND** oxyCODONE, sodium chloride, sodium chloride    Filed Vitals:   06/18/15 1119 06/18/15 1200 06/18/15 1300 06/18/15 1400  BP:  94/51 91/52 96/53   Pulse:      Temp: 98 F (36.7 C)     TempSrc: Oral     Resp:  17 16 18   Height:      Weight:      SpO2:  97% 100% 98%    Intake/Output Summary (Last 24 hours) at 06/18/15 1459 Last data filed at 06/18/15 1400  Gross per 24 hour  Intake  710.9 ml  Output   4319 ml    Net -3608.1 ml    LABS: Basic Metabolic Panel:  Recent Labs  40/98/11 0450 06/17/15 1500 06/18/15 0400 06/18/15 0435  NA 131* 135 134* 134*  K 3.9 3.9 3.9 4.0  CL 90* 99* 97* 98*  CO2 29 29 25 25   GLUCOSE 107* 180* 106* 105*  BUN 51* 37* 27* 27*  CREATININE 1.90* 1.60* 1.29* 1.26*  CALCIUM 8.5* 8.2* 8.2* 8.2*  MG 2.5*  --  2.6*  --   PHOS 4.1 2.8  --  2.3*   Liver Function Tests:  Recent Labs  06/17/15 0450 06/17/15 1500 06/18/15 0435  AST 188*  --   --   ALT 96*  --   --   ALKPHOS 127*  --   --   BILITOT 2.6*  --   --   PROT 6.9  --   --   ALBUMIN 3.0* 2.8* 2.8*   No results for input(s): LIPASE, AMYLASE in the last 72 hours. CBC:  Recent Labs  06/17/15 0450 06/18/15 0400  WBC 6.3 8.4  HGB 11.5* 11.8*  HCT 36.7 37.2  MCV 95.6 96.1  PLT 282 275   Cardiac Enzymes: No results for input(s): CKTOTAL, CKMB, CKMBINDEX, TROPONINI in the  last 72 hours. BNP: Invalid input(s): POCBNP D-Dimer: No results for input(s): DDIMER in the last 72 hours. Hemoglobin A1C: No results for input(s): HGBA1C in the last 72 hours. Fasting Lipid Panel: No results for input(s): CHOL, HDL, LDLCALC, TRIG, CHOLHDL, LDLDIRECT in the last 72 hours. Thyroid Function Tests: No results for input(s): TSH, T4TOTAL, T3FREE, THYROIDAB in the last 72 hours.  Invalid input(s): FREET3 Anemia Panel:  Recent Labs  06/16/15 1228  TIBC 417  IRON 22*    RADIOLOGY: Ct Abdomen Pelvis Wo Contrast  06/15/2015   CLINICAL DATA:  Uncontrolled vomiting for 5 days  EXAM: CT ABDOMEN AND PELVIS WITHOUT CONTRAST  TECHNIQUE: Multidetector CT imaging of the abdomen and pelvis was performed following the standard protocol without IV contrast.  COMPARISON:  01/20/2015  FINDINGS: Subsegmental atelectasis at the left lung base. Severe cardiomegaly. AICD device.  Postcholecystectomy  The left lobe of the liver is prominent but there is no evidence of nodularity of the contour of the liver.  Spleen,  pancreas, adrenal glands, and kidneys are within normal limits.  There is significant stranding throughout the subcutaneous fat across the abdomen.  Bladder and uterus are within normal limits. There is a 1.9 cm hyperdense area within the left adnexa of unknown significance. It may represent an ovarian complex cyst.  There is a small amount of free fluid layering in the pelvis. There is no obvious retroperitoneal adenopathy.  IMPRESSION: Small amount of free fluid in the pelvis is nonspecific.  1.9 cm hyperdense abnormality in the left adnexa may represent a lesion in the ovary. Pelvic ultrasound is warranted.  There is edema within the subcutaneous fat across the abdomen of unknown significance.  Left basilar subsegmental atelectasis.   Electronically Signed   By: Jolaine Click M.D.   On: 06/22/2015 19:02   Dg Chest Portable 1 View  06/16/2015   CLINICAL DATA:  49 year old female central line placement. Initial encounter.  EXAM: PORTABLE CHEST 1 VIEW  COMPARISON:  06/28/2015 and earlier.  FINDINGS: Portable AP semi upright view at 1154 hours. Continued low lung volumes. Pulmonary vascular congestion has regressed. Stable left chest cardiac AICD. Stable cardiac size and mediastinal contours.  Right PICC line remains in place, tip projects at the level of the medial right subclavian vein. An enteric tube is now in place in courses to the epigastrium, tip not included. There is a new left IJ approach dual lumen dialysis type catheter. Catheter tips project at the cavoatrial junction level. No pneumothorax. Continued retrocardiac opacity.  IMPRESSION: 1. Left IJ approach dual lumen dialysis type catheter placed with no adverse features. 2. Enteric tube placed, courses to the abdomen tip not included. 3. Stable right PICC line. 4. Interval regressed pulmonary edema.   Electronically Signed   By: Odessa Fleming M.D.   On: 06/16/2015 12:04   Dg Chest Portable 1 View  06/22/2015   CLINICAL DATA:  Bradycardia, CHF, chronic  milrinone infusion, nausea and vomiting  EXAM: PORTABLE CHEST 1 VIEW  COMPARISON:  05/26/2015  FINDINGS: Right PICC line tip remains at the right innominate vein as before. Left subclavian multilead defibrillator/pacer evident. Heart is enlarged with vascular and interstitial prominence suggesting mild edema. Limited exam because of body habitus. Increased left base density suggest chronic atelectasis when compared to the prior CT.  IMPRESSION: Cardiomegaly with increased vascular and interstitial prominence suggesting mild developing edema pattern compare to 05/26/2015.   Electronically Signed   By: Judie Petit.  Shick M.D.   On: 06/19/2015 17:25  Dg Chest Port 1 View  05/26/2015   CLINICAL DATA:  Shortness of breath and syncope 1 day.  EXAM: PORTABLE CHEST - 1 VIEW  COMPARISON:  05/16/2015  FINDINGS: Right-sided PICC line has been pulled back slightly with tip overlying the region of the SVC near the junction of the subclavian vein. Left-sided pacemaker unchanged.  Lungs are hypoinflated with mild prominence of the perihilar markings suggesting mild vascular congestion. There is mild stable cardiomegaly. Remainder of the exam is unchanged.  IMPRESSION: Stable mild cardiomegaly with evidence of mild vascular congestion.  Right-sided PICC line has been pulled back slightly with tip over the region of the SVC near the junction with the subclavian vein.   Electronically Signed   By: Elberta Fortis M.D.   On: 05/26/2015 21:02   Dg Abd Portable 1v  06/16/2015   CLINICAL DATA:  Status post nasogastric catheter placement  EXAM: PORTABLE ABDOMEN - 1 VIEW  COMPARISON:  None.  FINDINGS: Scattered large and small bowel gas is noted. A nasogastric catheter is noted deep within the stomach.  IMPRESSION: Nasogastric catheter within the stomach.   Electronically Signed   By: Alcide Clever M.D.   On: 06/16/2015 09:28    PHYSICAL EXAM CVP 15 General: NAD. In bed.  Neck: Thick, JVP difficult but appears elevated, no thyromegaly or  thyroid nodule noted.  Lungs: CTA CV: Nondisplaced PMI.  Heart regular S1/S2, no S3/S4, no murmur.  1-2+ edema to knees.   Abdomen: Soft, NT, no HSM, mild distention.  Neurologic: Alert and oriented x 3.  Psych: Drowsy. Opens eyes. Normal affect. Extremities: No clubbing or cyanosis.   TELEMETRY:  NSR 80s with BiV pacing.  ASSESSMENT AND PLAN: 1. Acute on chronic systolic CHF: Nonischemic cardiomyopathy, s/p [employer] CRT-D. EF 30-35% (03/2015 - on milrinone). Now on home milrinone 0.375 mcg/kg/min. NYHA class IIIb symptoms chronically.  - Admitted with elevated weight, nausea/vomiting, and pulmonary edema on CXR.   - Volume has been very difficult to control at home despite large diuretic doses.   - Nephrology consulted. Started on CVVHD.  - Continue milrinone 0.375 mcg/kg/min. Today's CO-OX 75.3%. With ESRD may need to switch to dobutamine.  - Our team was unable to offer her an LVAD due to concerns over an uncertain level of social support and also size/weight. Referred her to [facility] for 2nd opinion but had not yet gone.  Unfortunately, options are running low at this point.  2. Cardiogenic shock 3. VT/VF, recurrent:  - Ranolazine 500 mg bid and amiodarone 200 mg bid  4. H/O GI bleed with AVM: 5. Pulmonary HTN: RHC 05/01/15 PVR 4.0, improved. Continue current dose of sildenafil.  6. AKI on CKD: As above, suspect advancing cardiorenal syndrome.  Started on CVVHD.      Spoke to daughter in room.  In recent conversations Miss Musgrave has stated she would "everything possible done" in an emergency situation.  Daughter thinks POA is pts Father, who lives in [place].  Daughter states we can call her cell or house phone with any changes.   [name] PA-C  06/18/2015 2:59 PM   Advanced Heart Failure Team Pager [phone] (M-F; 7a - 4p)  Please contact [facility] for night-coverage after hours (4p -7a ) and weekends on [email or web address]   Patient seen and examined with [name], PA-C. We discussed all aspects of the encounter. I agree with the assessment and plan as stated above.   She remains critically ill. Lethargic but arousable. Remains  on CVVHD. She is making some urine but doubt it will be enough to maintain euvolemia. With her end-stage HF, she will not be candidate for outpatient HD. We will need to move toward Palliative Care.    The patient is critically ill with multiple organ systems failure and requires high complexity decision making for assessment and support, frequent evaluation and titration of therapies, application of advanced monitoring technologies and extensive interpretation of multiple databases.   Critical Care Time personally devoted to patient care services described in this note is 35 Minutes.  Bensimhon, Daniel,MD 7:47 PM

## 2015-06-18 NOTE — Consult Note (Signed)
   [facility] CM Inpatient Consult   06/18/2015  MISKI SCAVETTA October 18, 1965 [id number]   Came to bedside to speak with patient about keeping [facility] appointments with [facility] RNCM. However, she was sleeping soundly. Will continue to follow.   Raiford Noble, MSN-Ed, RN,BSN [facility] Liaison [phone]

## 2015-06-19 DIAGNOSIS — N189 Chronic kidney disease, unspecified: Secondary | ICD-10-CM

## 2015-06-19 LAB — GLUCOSE, CAPILLARY
GLUCOSE-CAPILLARY: 87 mg/dL (ref 65–99)
GLUCOSE-CAPILLARY: 73 mg/dL (ref 65–99)
GLUCOSE-CAPILLARY: 111 mg/dL — AB (ref 65–99)
Glucose-Capillary: 81 mg/dL (ref 65–99)
Glucose-Capillary: 98 mg/dL (ref 65–99)
Glucose-Capillary: 80 mg/dL (ref 65–99)

## 2015-06-19 LAB — CARBOXYHEMOGLOBIN
Carboxyhemoglobin: 1.9 % — ABNORMAL HIGH (ref 0.5–1.5)
Methemoglobin: 0.7 % (ref 0.0–1.5)
O2 SAT: 61.2 %
Total hemoglobin: 12.2 g/dL (ref 12.0–16.0)

## 2015-06-19 LAB — ALBUMIN: Albumin: 2.7 g/dL — ABNORMAL LOW (ref 3.5–5.0)

## 2015-06-19 LAB — BASIC METABOLIC PANEL
Anion gap: 8 (ref 5–15)
BUN: 17 mg/dL (ref 6–20)
CHLORIDE: 98 mmol/L — AB (ref 101–111)
CO2: 28 mmol/L (ref 22–32)
Calcium: 8.1 mg/dL — ABNORMAL LOW (ref 8.9–10.3)
Creatinine, Ser: 1.19 mg/dL — ABNORMAL HIGH (ref 0.44–1.00)
GFR calc Af Amer: >60 mL/min (ref >60)
GFR calc non Af Amer: 53 mL/min — ABNORMAL LOW (ref >60)
Glucose, Bld: 152 mg/dL — ABNORMAL HIGH (ref 65–99)
POTASSIUM: 3.7 mmol/L (ref 3.5–5.1)
SODIUM: 134 mmol/L — AB (ref 135–145)

## 2015-06-19 LAB — RENAL FUNCTION PANEL
ALBUMIN: 2.7 g/dL — AB (ref 3.5–5.0)
Anion gap: 8 (ref 5–15)
BUN: 15 mg/dL (ref 6–20)
CALCIUM: 8 mg/dL — AB (ref 8.9–10.3)
CO2: 28 mmol/L (ref 22–32)
CREATININE: 1.28 mg/dL — AB (ref 0.44–1.00)
Chloride: 96 mmol/L — ABNORMAL LOW (ref 101–111)
GFR calc non Af Amer: 48 mL/min — ABNORMAL LOW (ref >60)
GFR, EST AFRICAN AMERICAN: 56 mL/min — AB (ref >60)
GLUCOSE: 194 mg/dL — AB (ref 65–99)
PHOSPHORUS: 2.5 mg/dL (ref 2.5–4.6)
Potassium: 3.6 mmol/L (ref 3.5–5.1)
SODIUM: 132 mmol/L — AB (ref 135–145)

## 2015-06-19 LAB — APTT: aPTT: 77 seconds — ABNORMAL HIGH (ref 24–37)

## 2015-06-19 LAB — POCT ACTIVATED CLOTTING TIME
ACTIVATED CLOTTING TIME: 202 s
ACTIVATED CLOTTING TIME: 202 s
ACTIVATED CLOTTING TIME: 202 s
ACTIVATED CLOTTING TIME: 208 s
ACTIVATED CLOTTING TIME: 196 s
ACTIVATED CLOTTING TIME: 196 s
Activated Clotting Time: 190 seconds
Activated Clotting Time: 221 seconds
Activated Clotting Time: 214 seconds
Activated Clotting Time: 233 seconds

## 2015-06-19 LAB — PHOSPHORUS: PHOSPHORUS: 2.3 mg/dL — AB (ref 2.5–4.6)

## 2015-06-19 LAB — MAGNESIUM: MAGNESIUM: 2.4 mg/dL (ref 1.7–2.4)

## 2015-06-19 MED ORDER — SODIUM PHOSPHATE 3 MMOLE/ML IV SOLN
20.0000 mmol | Freq: Once | INTRAVENOUS | Status: AC
  Administered 2015-06-19: 20 mmol via INTRAVENOUS
  Filled 2015-06-19: qty 6.67

## 2015-06-19 NOTE — Progress Notes (Signed)
Subjective: Interval History: none. and has no complaint , doing better, doesn't remember me.  Objective: Vital signs in last 24 hours: Temp:  [97 F (36.1 C)-98 F (36.7 C)] 97.6 F (36.4 C) (10/08 0700) Resp:  [12-19] 16 (10/08 0700) BP: (85-124)/(51-93) 108/92 mmHg (10/08 0730) SpO2:  [93 %-100 %] 95 % (10/08 0700) Weight:  [118.2 kg (260 lb 9.3 oz)] 118.2 kg (260 lb 9.3 oz) (10/08 0341) Weight change: -2.4 kg (-5 lb 4.7 oz)  Intake/Output from previous day: 10/07 0701 - 10/08 0700 In: 721.2 [P.O.:120; I.V.:343.2; IV Piggyback:258] Out: 5505 [Urine:1570] Intake/Output this shift:    General appearance: alert, cooperative and moderately obese Neck: LIJ cath Resp: diminished breath sounds bilaterally Cardio: S1, S2 normal and systolic murmur: holosystolic 2/6, blowing at apex GI: obese, nontender, soft Extremities: edema 2-3+  Lab Results:  Recent Labs  06/17/15 0450 06/18/15 0400 06/18/15 1530  WBC 6.3 8.4  --   HGB 11.5* 11.8* 17.3*  HCT 36.7 37.2 51.0*  PLT 282 275  --    BMET:  Recent Labs  06/18/15 1530 06/19/15 0350  NA 135  138 134*  K 3.7  3.9 3.7  CL 97* 98*  CO2 29 28  GLUCOSE 168* 152*  BUN 21* 17  CREATININE 1.29* 1.19*  CALCIUM 7.9* 8.1*    Recent Labs  06/16/15 1228  PTH 214*   Iron Studies:  Recent Labs  06/16/15 1228  IRON 22*  TIBC 417    Studies/Results: No results found.  I have reviewed the patient's current medications.  Assessment/Plan: 1 AKI/CKD  Down over 9 liters.  Still vol xs, . BP soft this am so will decrease net neg. CVP still ^ and vol xs.  Will see if can ^ vol off later.  Would like to try off CRRT in about 24 h.  Acid/base/solute/K ok. Replete phos. 2 CKD 3 DM controlled 4 Obesity 5 CM lowering vol on inotrope 6 MR 7 OSA 8 Ileus improving 9 anemia repeat CBC 10 confusion improving P lower net neg, replete phos, Lasix, control glu    LOS: 5 days   Jowanna Loeffler L 06/19/2015,7:53 AM

## 2015-06-19 NOTE — Progress Notes (Signed)
Patient ID: [name], female   DOB: 12-Jul-1966, 49 y.o.   MRN: [id number]   SUBJECTIVE: Patient was admitted with nausea, vomiting, and weight gain. Pulmonary edema on CXR.    Remains on CVVHD at ~ 150 ml/hr. CVP remains 15. Much more alert today. Urine output ~40/hr  Scheduled Meds: . allopurinol  300 mg Oral Daily  . amiodarone  200 mg Oral BID  . antiseptic oral rinse  7 mL Mouth Rinse 6 times per day  . aspirin EC  81 mg Oral Daily  . furosemide  160 mg Oral Q6H  . gabapentin  600 mg Oral QID  . heparin subcutaneous  5,000 Units Subcutaneous 3 times per day  . insulin aspart  0-15 Units Subcutaneous TID WC  . magnesium oxide  400 mg Oral BID  . pantoprazole  40 mg Oral Daily  . ranolazine  500 mg Oral BID  . sildenafil  40 mg Oral TID  . sodium chloride  10-40 mL Intracatheter Q12H  . sodium phosphate  Dextrose 5% IVPB  20 mmol Intravenous Once   Continuous Infusions: . heparin 10,000 units/ 20 mL infusion syringe 950 Units/hr (06/19/15 1000)  . milrinone 0.375 mcg/kg/min (06/19/15 0900)  . dialysis replacement fluid (prismasate) 800 mL/hr at 06/19/15 0917  . dialysis replacement fluid (prismasate) 700 mL/hr at 06/19/15 0402  . dialysate (PRISMASATE) 1,500 mL/hr at 06/19/15 0715   PRN Meds:.albuterol, cyclobenzaprine, heparin, heparin, ondansetron (ZOFRAN) IV, oxyCODONE-acetaminophen **AND** oxyCODONE, sodium chloride, sodium chloride    Filed Vitals:   06/19/15 0700 06/19/15 0730 06/19/15 0800 06/19/15 0900  BP: 85/59 108/92 101/60 110/72  Pulse:      Temp: 97.6 F (36.4 C)     TempSrc: Oral     Resp: Height:      Weight:      SpO2: 95%  100% 100%    Intake/Output Summary (Last 24 hours) at 06/19/15 1032 Last data filed at 06/19/15 1000  Gross per 24 hour  Intake  812.6 ml  Output   5598 ml  Net -4785.4 ml    LABS: Basic Metabolic Panel:  Recent Labs  08/65/78 0400  06/18/15 1530 06/19/15 0350  NA 134*  < > 135  138 134*  K 3.9  <  > 3.7  3.9 3.7  CL 97*  < > 97* 98*  CO2 25  < > 29 28  GLUCOSE 106*  < > 168* 152*  BUN 27*  < > 21* 17  CREATININE 1.29*  < > 1.29* 1.19*  CALCIUM 8.2*  < > 7.9* 8.1*  MG 2.6*  --   --  2.4  PHOS  --   < > 4.1 2.3*  < > = values in this interval not displayed. Liver Function Tests:  Recent Labs  06/17/15 0450  06/18/15 1530 06/19/15 0350  AST 188*  --   --   --   ALT 96*  --   --   --   ALKPHOS 127*  --   --   --   BILITOT 2.6*  --   --   --   PROT 6.9  --   --   --   ALBUMIN 3.0*  < > 2.8* 2.7*  < > = values in this interval not displayed. No results for input(s): LIPASE, AMYLASE in the last 72 hours. CBC:  Recent Labs  06/17/15 0450 06/18/15 0400 06/18/15 1530  WBC 6.3 8.4  --   HGB  11.5* 11.8* 17.3*  HCT 36.7 37.2 51.0*  MCV 95.6 96.1  --   PLT 282 275  --    Cardiac Enzymes: No results for input(s): CKTOTAL, CKMB, CKMBINDEX, TROPONINI in the last 72 hours. BNP: Invalid input(s): POCBNP D-Dimer: No results for input(s): DDIMER in the last 72 hours. Hemoglobin A1C: No results for input(s): HGBA1C in the last 72 hours. Fasting Lipid Panel: No results for input(s): CHOL, HDL, LDLCALC, TRIG, CHOLHDL, LDLDIRECT in the last 72 hours. Thyroid Function Tests: No results for input(s): TSH, T4TOTAL, T3FREE, THYROIDAB in the last 72 hours.  Invalid input(s): FREET3 Anemia Panel:  Recent Labs  06/16/15 1228  TIBC 417  IRON 22*    RADIOLOGY: Ct Abdomen Pelvis Wo Contrast  June 17, 2015   CLINICAL DATA:  Uncontrolled vomiting for 5 days  EXAM: CT ABDOMEN AND PELVIS WITHOUT CONTRAST  TECHNIQUE: Multidetector CT imaging of the abdomen and pelvis was performed following the standard protocol without IV contrast.  COMPARISON:  01/20/2015  FINDINGS: Subsegmental atelectasis at the left lung base. Severe cardiomegaly. AICD device.  Postcholecystectomy  The left lobe of the liver is prominent but there is no evidence of nodularity of the contour of the liver.  Spleen,  pancreas, adrenal glands, and kidneys are within normal limits.  There is significant stranding throughout the subcutaneous fat across the abdomen.  Bladder and uterus are within normal limits. There is a 1.9 cm hyperdense area within the left adnexa of unknown significance. It may represent an ovarian complex cyst.  There is a small amount of free fluid layering in the pelvis. There is no obvious retroperitoneal adenopathy.  IMPRESSION: Small amount of free fluid in the pelvis is nonspecific.  1.9 cm hyperdense abnormality in the left adnexa may represent a lesion in the ovary. Pelvic ultrasound is warranted.  There is edema within the subcutaneous fat across the abdomen of unknown significance.  Left basilar subsegmental atelectasis.   Electronically Signed   By: Jolaine Click M.D.   On: 06-17-15 19:02   Dg Chest Portable 1 View  06/16/2015   CLINICAL DATA:  49 year old female central line placement. Initial encounter.  EXAM: PORTABLE CHEST 1 VIEW  COMPARISON:  Jun 17, 2015 and earlier.  FINDINGS: Portable AP semi upright view at 1154 hours. Continued low lung volumes. Pulmonary vascular congestion has regressed. Stable left chest cardiac AICD. Stable cardiac size and mediastinal contours.  Right PICC line remains in place, tip projects at the level of the medial right subclavian vein. An enteric tube is now in place in courses to the epigastrium, tip not included. There is a new left IJ approach dual lumen dialysis type catheter. Catheter tips project at the cavoatrial junction level. No pneumothorax. Continued retrocardiac opacity.  IMPRESSION: 1. Left IJ approach dual lumen dialysis type catheter placed with no adverse features. 2. Enteric tube placed, courses to the abdomen tip not included. 3. Stable right PICC line. 4. Interval regressed pulmonary edema.   Electronically Signed   By: Odessa Fleming M.D.   On: 06/16/2015 12:04   Dg Chest Portable 1 View  06-17-15   CLINICAL DATA:  Bradycardia, CHF, chronic  milrinone infusion, nausea and vomiting  EXAM: PORTABLE CHEST 1 VIEW  COMPARISON:  05/26/2015  FINDINGS: Right PICC line tip remains at the right innominate vein as before. Left subclavian multilead defibrillator/pacer evident. Heart is enlarged with vascular and interstitial prominence suggesting mild edema. Limited exam because of body habitus. Increased left base density suggest chronic atelectasis when compared to the  prior CT.  IMPRESSION: Cardiomegaly with increased vascular and interstitial prominence suggesting mild developing edema pattern compare to 05/26/2015.   Electronically Signed   By: Judie Petit.  Shick M.D.   On: 06/27/15 17:25   Dg Chest Port 1 View  05/26/2015   CLINICAL DATA:  Shortness of breath and syncope 1 day.  EXAM: PORTABLE CHEST - 1 VIEW  COMPARISON:  05/16/2015  FINDINGS: Right-sided PICC line has been pulled back slightly with tip overlying the region of the SVC near the junction of the subclavian vein. Left-sided pacemaker unchanged.  Lungs are hypoinflated with mild prominence of the perihilar markings suggesting mild vascular congestion. There is mild stable cardiomegaly. Remainder of the exam is unchanged.  IMPRESSION: Stable mild cardiomegaly with evidence of mild vascular congestion.  Right-sided PICC line has been pulled back slightly with tip over the region of the SVC near the junction with the subclavian vein.   Electronically Signed   By: Elberta Fortis M.D.   On: 05/26/2015 21:02   Dg Abd Portable 1v  06/16/2015   CLINICAL DATA:  Status post nasogastric catheter placement  EXAM: PORTABLE ABDOMEN - 1 VIEW  COMPARISON:  None.  FINDINGS: Scattered large and small bowel gas is noted. A nasogastric catheter is noted deep within the stomach.  IMPRESSION: Nasogastric catheter within the stomach.   Electronically Signed   By: Alcide Clever M.D.   On: 06/16/2015 09:28    PHYSICAL EXAM CVP 15 General: NAD. Sitting up in bed. Wide awake Neck: Thick, JVP difficult but appears  elevated, no thyromegaly or thyroid nodule noted.  Lungs: CTA CV: Nondisplaced PMI.  Heart regular S1/S2, no S3/S4, no murmur.  1-2+ edema to knees.   Abdomen: Soft, NT, no HSM, mild distention.  Neurologic: Alert and oriented x 3.  Psych: awake interactive. nonfocal Extremities: No clubbing or cyanosis.   TELEMETRY:  NSR 80s with BiV pacing.  ASSESSMENT AND PLAN: 1. Acute on chronic systolic CHF: Nonischemic cardiomyopathy, s/p [employer] CRT-D. EF 30-35% (03/2015 - on milrinone). Now on home milrinone 0.375 mcg/kg/min. NYHA class IIIb symptoms chronically.  - Admitted with elevated weight, nausea/vomiting, and pulmonary edema on CXR.   - Volume has been very difficult to control at home despite large diuretic doses.   - Nephrology consulted. Started on CVVHD.  - Continue milrinone 0.375 mcg/kg/min. Today's CO-OX 61%. With ESRD may need to switch to dobutamine.  - Our team was unable to offer her an LVAD due to concerns over an uncertain level of social support and also size/weight. Referred her to [facility] for 2nd opinion but had not yet gone.  Unfortunately, options are running low at this point.  2. Cardiogenic shock 3. VT/VF, recurrent:  - Ranolazine 500 mg bid and amiodarone 200 mg bid  4. H/O GI bleed with AVM: 5. Pulmonary HTN: RHC 05/01/15 PVR 4.0, improved. Continue current dose of sildenafil.  6. AKI on CKD: As above, suspect advancing cardiorenal syndrome.  Started on CVVHD.    -CVP remains 15. Will continue CVVHD. Urine output picking up some.   Remains on CVVHD. She is making some urine but not sure it will be enough to maintain euvolemia. With her end-stage HF, she will not be candidate for outpatient HD. Suspect we will need to move toward Palliative Care.   Appreciate Renal input.  Bensimhon, Daniel,MD 10:32 AM Advanced Heart Failure Team Pager [phone] (M-F; 7a - 4p)  Please contact [facility] for night-coverage after hours (4p -7a ) and weekends on  [email or web address]

## 2015-06-20 DIAGNOSIS — N184 Chronic kidney disease, stage 4 (severe): Secondary | ICD-10-CM

## 2015-06-20 LAB — RENAL FUNCTION PANEL
ALBUMIN: 2.5 g/dL — AB (ref 3.5–5.0)
ALBUMIN: 2.7 g/dL — AB (ref 3.5–5.0)
ANION GAP: 10 (ref 5–15)
Anion gap: 9 (ref 5–15)
BUN: 14 mg/dL (ref 6–20)
BUN: 12 mg/dL (ref 6–20)
CALCIUM: 8.1 mg/dL — AB (ref 8.9–10.3)
CALCIUM: 8.1 mg/dL — AB (ref 8.9–10.3)
CO2: 28 mmol/L (ref 22–32)
CO2: 26 mmol/L (ref 22–32)
CREATININE: 1.28 mg/dL — AB (ref 0.44–1.00)
Chloride: 93 mmol/L — ABNORMAL LOW (ref 101–111)
Chloride: 96 mmol/L — ABNORMAL LOW (ref 101–111)
Creatinine, Ser: 1.29 mg/dL — ABNORMAL HIGH (ref 0.44–1.00)
GFR calc Af Amer: 56 mL/min — ABNORMAL LOW (ref >60)
GFR calc Af Amer: 55 mL/min — ABNORMAL LOW (ref >60)
GFR, EST NON AFRICAN AMERICAN: 48 mL/min — AB (ref >60)
GFR, EST NON AFRICAN AMERICAN: 48 mL/min — AB (ref >60)
GLUCOSE: 188 mg/dL — AB (ref 65–99)
GLUCOSE: 174 mg/dL — AB (ref 65–99)
PHOSPHORUS: 1.9 mg/dL — AB (ref 2.5–4.6)
PHOSPHORUS: 1.9 mg/dL — AB (ref 2.5–4.6)
POTASSIUM: 3.8 mmol/L (ref 3.5–5.1)
POTASSIUM: 3.8 mmol/L (ref 3.5–5.1)
SODIUM: 130 mmol/L — AB (ref 135–145)
SODIUM: 132 mmol/L — AB (ref 135–145)

## 2015-06-20 LAB — CARBOXYHEMOGLOBIN
Carboxyhemoglobin: 2.8 % — ABNORMAL HIGH (ref 0.5–1.5)
Methemoglobin: 0.7 % (ref 0.0–1.5)
O2 SAT: 72.4 %
Total hemoglobin: 11.4 g/dL — ABNORMAL LOW (ref 12.0–16.0)

## 2015-06-20 LAB — CBC
HEMATOCRIT: 36.7 % (ref 36.0–46.0)
Hemoglobin: 11.6 g/dL — ABNORMAL LOW (ref 12.0–15.0)
MCH: 29.8 pg (ref 26.0–34.0)
MCHC: 31.6 g/dL (ref 30.0–36.0)
MCV: 94.3 fL (ref 78.0–100.0)
Platelets: 241 10*3/uL (ref 150–400)
RBC: 3.89 MIL/uL (ref 3.87–5.11)
RDW: 16.7 % — AB (ref 11.5–15.5)
WBC: 5.6 10*3/uL (ref 4.0–10.5)

## 2015-06-20 LAB — MAGNESIUM: MAGNESIUM: 2.4 mg/dL (ref 1.7–2.4)

## 2015-06-20 LAB — POCT ACTIVATED CLOTTING TIME
ACTIVATED CLOTTING TIME: 196 s
ACTIVATED CLOTTING TIME: 202 s
ACTIVATED CLOTTING TIME: 208 s
ACTIVATED CLOTTING TIME: 214 s
ACTIVATED CLOTTING TIME: 227 s
ACTIVATED CLOTTING TIME: 227 s
ACTIVATED CLOTTING TIME: 233 s
ACTIVATED CLOTTING TIME: 239 s
Activated Clotting Time: 196 seconds
Activated Clotting Time: 208 seconds
Activated Clotting Time: 208 seconds

## 2015-06-20 LAB — GLUCOSE, CAPILLARY
GLUCOSE-CAPILLARY: 105 mg/dL — AB (ref 65–99)
GLUCOSE-CAPILLARY: 113 mg/dL — AB (ref 65–99)
GLUCOSE-CAPILLARY: 84 mg/dL (ref 65–99)
Glucose-Capillary: 95 mg/dL (ref 65–99)
Glucose-Capillary: 156 mg/dL — ABNORMAL HIGH (ref 65–99)

## 2015-06-20 LAB — APTT: APTT: 97 s — AB (ref 24–37)

## 2015-06-20 MED ORDER — PHENOL 1.4 % MT LIQD
1.0000 | OROMUCOSAL | Status: DC | PRN
  Administered 2015-06-20 (×2): 1 via OROMUCOSAL

## 2015-06-20 NOTE — Progress Notes (Signed)
Patient ID: [name], female   DOB: October 03, 1965, 49 y.o.   MRN: [id number]   SUBJECTIVE: Patient was admitted with nausea, vomiting, and weight gain. Pulmonary edema on CXR.    Remains on CVVHD at ~ 100-150 ml/hr. Weight down 25 pounds. CVP still 15-17 though.  Urine output about 645cc yesterday. Co-ox 72%. Feels much better.   Scheduled Meds: . allopurinol  300 mg Oral Daily  . amiodarone  200 mg Oral BID  . aspirin EC  81 mg Oral Daily  . furosemide  160 mg Oral Q6H  . gabapentin  600 mg Oral QID  . heparin subcutaneous  5,000 Units Subcutaneous 3 times per day  . insulin aspart  0-15 Units Subcutaneous TID WC  . magnesium oxide  400 mg Oral BID  . pantoprazole  40 mg Oral Daily  . ranolazine  500 mg Oral BID  . sildenafil  40 mg Oral TID  . sodium chloride  10-40 mL Intracatheter Q12H   Continuous Infusions: . heparin 10,000 units/ 20 mL infusion syringe 900 Units/hr (06/20/15 1200)  . milrinone 0.375 mcg/kg/min (06/20/15 1497)  . dialysis replacement fluid (prismasate) 800 mL/hr at 06/20/15 1010  . dialysis replacement fluid (prismasate) 700 mL/hr at 06/20/15 1010  . dialysate (PRISMASATE) 1,500 mL/hr at 06/20/15 0808   PRN Meds:.albuterol, cyclobenzaprine, heparin, heparin, ondansetron (ZOFRAN) IV, oxyCODONE-acetaminophen **AND** oxyCODONE, phenol, sodium chloride, sodium chloride    Filed Vitals:   06/20/15 0800 06/20/15 0900 06/20/15 1000 06/20/15 1100  BP: 109/71 106/80 113/83 116/81  Pulse:      Temp:      TempSrc:      Resp: 17 20 23 19   Height:      Weight:      SpO2: 97% 96% 95% 99%    Intake/Output Summary (Last 24 hours) at 06/20/15 1313 Last data filed at 06/20/15 1200  Gross per 24 hour  Intake 1191.9 ml  Output   3132 ml  Net -1940.1 ml    LABS: Basic Metabolic Panel:  Recent Labs  02/63/78 0350 06/19/15 1550 06/20/15 0405  NA 134* 132* 130*  K 3.7 3.6 3.8  CL 98* 96* 93*  CO2 28 28 28   GLUCOSE 152* 194* 188*  BUN 17 15 14     CREATININE 1.19* 1.28* 1.28*  CALCIUM 8.1* 8.0* 8.1*  MG 2.4  --  2.4  PHOS 2.3* 2.5 1.9*   Liver Function Tests:  Recent Labs  06/19/15 1550 06/20/15 0405  ALBUMIN 2.7* 2.5*   No results for input(s): LIPASE, AMYLASE in the last 72 hours. CBC:  Recent Labs  06/18/15 0400 06/18/15 1530 06/20/15 0405  WBC 8.4  --  5.6  HGB 11.8* 17.3* 11.6*  HCT 37.2 51.0* 36.7  MCV 96.1  --  94.3  PLT 275  --  241   Cardiac Enzymes: No results for input(s): CKTOTAL, CKMB, CKMBINDEX, TROPONINI in the last 72 hours. BNP: Invalid input(s): POCBNP D-Dimer: No results for input(s): DDIMER in the last 72 hours. Hemoglobin A1C: No results for input(s): HGBA1C in the last 72 hours. Fasting Lipid Panel: No results for input(s): CHOL, HDL, LDLCALC, TRIG, CHOLHDL, LDLDIRECT in the last 72 hours. Thyroid Function Tests: No results for input(s): TSH, T4TOTAL, T3FREE, THYROIDAB in the last 72 hours.  Invalid input(s): FREET3 Anemia Panel: No results for input(s): VITAMINB12, FOLATE, FERRITIN, TIBC, IRON, RETICCTPCT in the last 72 hours.  RADIOLOGY: Ct Abdomen Pelvis Wo Contrast  2015/07/05   CLINICAL DATA:  Uncontrolled vomiting for 5  days  EXAM: CT ABDOMEN AND PELVIS WITHOUT CONTRAST  TECHNIQUE: Multidetector CT imaging of the abdomen and pelvis was performed following the standard protocol without IV contrast.  COMPARISON:  01/20/2015  FINDINGS: Subsegmental atelectasis at the left lung base. Severe cardiomegaly. AICD device.  Postcholecystectomy  The left lobe of the liver is prominent but there is no evidence of nodularity of the contour of the liver.  Spleen, pancreas, adrenal glands, and kidneys are within normal limits.  There is significant stranding throughout the subcutaneous fat across the abdomen.  Bladder and uterus are within normal limits. There is a 1.9 cm hyperdense area within the left adnexa of unknown significance. It may represent an ovarian complex cyst.  There is a small  amount of free fluid layering in the pelvis. There is no obvious retroperitoneal adenopathy.  IMPRESSION: Small amount of free fluid in the pelvis is nonspecific.  1.9 cm hyperdense abnormality in the left adnexa may represent a lesion in the ovary. Pelvic ultrasound is warranted.  There is edema within the subcutaneous fat across the abdomen of unknown significance.  Left basilar subsegmental atelectasis.   Electronically Signed   By: Jolaine Click M.D.   On: 2015-07-06 19:02   Dg Chest Portable 1 View  06/16/2015   CLINICAL DATA:  49 year old female central line placement. Initial encounter.  EXAM: PORTABLE CHEST 1 VIEW  COMPARISON:  July 06, 2015 and earlier.  FINDINGS: Portable AP semi upright view at 1154 hours. Continued low lung volumes. Pulmonary vascular congestion has regressed. Stable left chest cardiac AICD. Stable cardiac size and mediastinal contours.  Right PICC line remains in place, tip projects at the level of the medial right subclavian vein. An enteric tube is now in place in courses to the epigastrium, tip not included. There is a new left IJ approach dual lumen dialysis type catheter. Catheter tips project at the cavoatrial junction level. No pneumothorax. Continued retrocardiac opacity.  IMPRESSION: 1. Left IJ approach dual lumen dialysis type catheter placed with no adverse features. 2. Enteric tube placed, courses to the abdomen tip not included. 3. Stable right PICC line. 4. Interval regressed pulmonary edema.   Electronically Signed   By: Odessa Fleming M.D.   On: 06/16/2015 12:04   Dg Chest Portable 1 View  2015-07-06   CLINICAL DATA:  Bradycardia, CHF, chronic milrinone infusion, nausea and vomiting  EXAM: PORTABLE CHEST 1 VIEW  COMPARISON:  05/26/2015  FINDINGS: Right PICC line tip remains at the right innominate vein as before. Left subclavian multilead defibrillator/pacer evident. Heart is enlarged with vascular and interstitial prominence suggesting mild edema. Limited exam because of  body habitus. Increased left base density suggest chronic atelectasis when compared to the prior CT.  IMPRESSION: Cardiomegaly with increased vascular and interstitial prominence suggesting mild developing edema pattern compare to 05/26/2015.   Electronically Signed   By: Judie Petit.  Shick M.D.   On: 07/06/2015 17:25   Dg Chest Port 1 View  05/26/2015   CLINICAL DATA:  Shortness of breath and syncope 1 day.  EXAM: PORTABLE CHEST - 1 VIEW  COMPARISON:  05/16/2015  FINDINGS: Right-sided PICC line has been pulled back slightly with tip overlying the region of the SVC near the junction of the subclavian vein. Left-sided pacemaker unchanged.  Lungs are hypoinflated with mild prominence of the perihilar markings suggesting mild vascular congestion. There is mild stable cardiomegaly. Remainder of the exam is unchanged.  IMPRESSION: Stable mild cardiomegaly with evidence of mild vascular congestion.  Right-sided PICC line has been  pulled back slightly with tip over the region of the SVC near the junction with the subclavian vein.   Electronically Signed   By: Elberta Fortis M.D.   On: 05/26/2015 21:02   Dg Abd Portable 1v  06/16/2015   CLINICAL DATA:  Status post nasogastric catheter placement  EXAM: PORTABLE ABDOMEN - 1 VIEW  COMPARISON:  None.  FINDINGS: Scattered large and small bowel gas is noted. A nasogastric catheter is noted deep within the stomach.  IMPRESSION: Nasogastric catheter within the stomach.   Electronically Signed   By: Alcide Clever M.D.   On: 06/16/2015 09:28    PHYSICAL EXAM CVP 15-17 General: NAD. Resting comfortably Neck: Thick, JVP difficult but appears elevated, no thyromegaly or thyroid nodule noted.  Lungs: CTA CV: Nondisplaced PMI.  Heart regular S1/S2, no S3/S4, no murmur.  1-2+ edema to knees.   Abdomen: Soft, NT, no HSM, mild distention.  Neurologic: Alert and oriented x 3.  Psych: awake interactive. nonfocal Extremities: No clubbing or cyanosis.   TELEMETRY:  NSR 80s with BiV  pacing.  ASSESSMENT AND PLAN: 1. Acute on chronic systolic CHF: Nonischemic cardiomyopathy, s/p [employer] CRT-D. EF 30-35% (03/2015 - on milrinone). Now on home milrinone 0.375 mcg/kg/min. NYHA class IIIb symptoms chronically.  - Admitted with elevated weight, nausea/vomiting, and pulmonary edema on CXR.   - Volume has been very difficult to control at home despite large diuretic doses.   - Nephrology consulted. Started on CVVHD. Weight down 25 pounds but CVP still way up - On milrinone  At 0.375 mcg/kg/min. Today's CO-OX 71%. Will decrease to 0.25. with ESRD may need to switch to dobutamine.  - Our team was unable to offer her an LVAD due to concerns over an uncertain level of social support and also size/weight. Referred her to [facility] for 2nd opinion but had not yet gone.  Unfortunately, options are running low at this point.  2. Cardiogenic shock 3. VT/VF, recurrent:  - Ranolazine 500 mg bid and amiodarone 200 mg bid  4. H/O GI bleed with AVM: 5. Pulmonary HTN: RHC 05/01/15 PVR 4.0, improved. Continue current dose of sildenafil.  6. AKI on CKD: As above, suspect advancing cardiorenal syndrome.  Started on CVVHD.    -CVP remains 15-17. Will continue CVVHD. Renal plans to stop after this filter.  Remains on CVVHD. She is making some urine but not sure it will be enough to maintain euvolemia. With her end-stage biventricular HF doubt we will be able to get CVP < 10. HF likely will not permit intermittent HD. Suspect we will need to move toward Palliative Care.   Appreciate Renal input.  Bensimhon, Daniel,MD 1:13 PM Advanced Heart Failure Team Pager [phone] (M-F; 7a - 4p)  Please contact [facility] for night-coverage after hours (4p -7a ) and weekends on [email or web address]

## 2015-06-20 NOTE — Progress Notes (Signed)
Subjective: Interval History: has complaints still has fluid xs.  Objective: Vital signs in last 24 hours: Temp:  [97.4 F (36.3 C)-99 F (37.2 C)] 98.4 F (36.9 C) (10/09 0400) Resp:  [14-23] 15 (10/09 0700) BP: (86-112)/(41-80) 110/41 mmHg (10/09 0700) SpO2:  [89 %-100 %] 92 % (10/09 0700) Weight:  [116 kg (255 lb 11.7 oz)] 116 kg (255 lb 11.7 oz) (10/09 0500) Weight change: -2.2 kg (-4 lb 13.6 oz)  Intake/Output from previous day: 10/08 0701 - 10/09 0700 In: 1363.9 [P.O.:820; I.V.:328.9; IV Piggyback:215] Out: 3593 [Urine:645] Intake/Output this shift:    General appearance: alert, cooperative, no distress and morbidly obese Neck: L IJ cath Resp: diminished breath sounds bilaterally Breasts: not eval GI: obese, pos bs, liver down 6 cm Extremities: edema 2-3+  CV reg Br 2/6 M,  PMU 14 cm lat to MSL  Lab Results:  Recent Labs  06/18/15 0400 06/18/15 1530 06/20/15 0405  WBC 8.4  --  5.6  HGB 11.8* 17.3* 11.6*  HCT 37.2 51.0* 36.7  PLT 275  --  241   BMET:  Recent Labs  06/19/15 1550 06/20/15 0405  NA 132* 130*  K 3.6 3.8  CL 96* 93*  CO2 28 28  GLUCOSE 194* 188*  BUN 15 14  CREATININE 1.28* 1.28*  CALCIUM 8.0* 8.1*   No results for input(s): PTH in the last 72 hours. Iron Studies: No results for input(s): IRON, TIBC, TRANSFERRIN, FERRITIN in the last 72 hours.  Studies/Results: No results found.  I have reviewed the patient's current medications.  Assessment/Plan: 1 AKI down over 11 L.  Acid/base/K/solute ok.  Will see how does off CRRT now, cont  Diuretics 2 Anemia stable  3 CM main issue 4 Obeisty 5 DM controlled 6 OSA 7 MR P CRRT, stop when filter times out.  Lasix  LOS: 6 days   Laurine Kuyper L 06/20/2015,7:32 AM

## 2015-06-21 LAB — CBC
HEMATOCRIT: 36.8 % (ref 36.0–46.0)
HEMOGLOBIN: 11.7 g/dL — AB (ref 12.0–15.0)
MCH: 29.6 pg (ref 26.0–34.0)
MCHC: 31.8 g/dL (ref 30.0–36.0)
MCV: 93.2 fL (ref 78.0–100.0)
PLATELETS: 269 10*3/uL (ref 150–400)
RBC: 3.95 MIL/uL (ref 3.87–5.11)
RDW: 16.8 % — ABNORMAL HIGH (ref 11.5–15.5)
WBC: 4.6 10*3/uL (ref 4.0–10.5)

## 2015-06-21 LAB — RENAL FUNCTION PANEL
ANION GAP: 7 (ref 5–15)
Albumin: 2.8 g/dL — ABNORMAL LOW (ref 3.5–5.0)
BUN: 13 mg/dL (ref 6–20)
CHLORIDE: 96 mmol/L — AB (ref 101–111)
CO2: 27 mmol/L (ref 22–32)
CREATININE: 1.4 mg/dL — AB (ref 0.44–1.00)
Calcium: 8.1 mg/dL — ABNORMAL LOW (ref 8.9–10.3)
GFR calc non Af Amer: 43 mL/min — ABNORMAL LOW (ref >60)
GFR, EST AFRICAN AMERICAN: 50 mL/min — AB (ref >60)
Glucose, Bld: 152 mg/dL — ABNORMAL HIGH (ref 65–99)
Phosphorus: 1.9 mg/dL — ABNORMAL LOW (ref 2.5–4.6)
Potassium: 3.9 mmol/L (ref 3.5–5.1)
Sodium: 130 mmol/L — ABNORMAL LOW (ref 135–145)

## 2015-06-21 LAB — COMPREHENSIVE METABOLIC PANEL
ALT: 89 U/L — AB (ref 14–54)
AST: 81 U/L — AB (ref 15–41)
Albumin: 2.7 g/dL — ABNORMAL LOW (ref 3.5–5.0)
Alkaline Phosphatase: 119 U/L (ref 38–126)
Anion gap: 8 (ref 5–15)
BUN: 11 mg/dL (ref 6–20)
CHLORIDE: 99 mmol/L — AB (ref 101–111)
CO2: 28 mmol/L (ref 22–32)
CREATININE: 1.07 mg/dL — AB (ref 0.44–1.00)
Calcium: 8.5 mg/dL — ABNORMAL LOW (ref 8.9–10.3)
GFR calc Af Amer: >60 mL/min (ref >60)
GFR, EST NON AFRICAN AMERICAN: 60 mL/min — AB (ref >60)
Glucose, Bld: 98 mg/dL (ref 65–99)
Potassium: 4 mmol/L (ref 3.5–5.1)
Sodium: 135 mmol/L (ref 135–145)
Total Bilirubin: 1.4 mg/dL — ABNORMAL HIGH (ref 0.3–1.2)
Total Protein: 7.4 g/dL (ref 6.5–8.1)

## 2015-06-21 LAB — GLUCOSE, CAPILLARY
GLUCOSE-CAPILLARY: 101 mg/dL — AB (ref 65–99)
GLUCOSE-CAPILLARY: 102 mg/dL — AB (ref 65–99)
GLUCOSE-CAPILLARY: 118 mg/dL — AB (ref 65–99)
GLUCOSE-CAPILLARY: 86 mg/dL (ref 65–99)
GLUCOSE-CAPILLARY: 88 mg/dL (ref 65–99)
Glucose-Capillary: 103 mg/dL — ABNORMAL HIGH (ref 65–99)
Glucose-Capillary: 97 mg/dL (ref 65–99)
Glucose-Capillary: 94 mg/dL (ref 65–99)

## 2015-06-21 LAB — CARBOXYHEMOGLOBIN
Carboxyhemoglobin: 2.5 % — ABNORMAL HIGH (ref 0.5–1.5)
METHEMOGLOBIN: 0.8 % (ref 0.0–1.5)
O2 SAT: 59.9 %
TOTAL HEMOGLOBIN: 11.9 g/dL — AB (ref 12.0–16.0)

## 2015-06-21 LAB — POCT ACTIVATED CLOTTING TIME
ACTIVATED CLOTTING TIME: 196 s
Activated Clotting Time: 208 seconds
Activated Clotting Time: 196 seconds
Activated Clotting Time: 202 seconds

## 2015-06-21 LAB — PHOSPHORUS: PHOSPHORUS: 1.7 mg/dL — AB (ref 2.5–4.6)

## 2015-06-21 LAB — MAGNESIUM: Magnesium: 2.4 mg/dL (ref 1.7–2.4)

## 2015-06-21 LAB — APTT: aPTT: 80 seconds — ABNORMAL HIGH (ref 24–37)

## 2015-06-21 MED ORDER — GABAPENTIN 300 MG PO CAPS
600.0000 mg | ORAL_CAPSULE | Freq: Three times a day (TID) | ORAL | Status: DC
  Administered 2015-06-21: 600 mg via ORAL
  Filled 2015-06-21: qty 2

## 2015-06-21 MED ORDER — SODIUM CHLORIDE 0.9 % IV SOLN
INTRAVENOUS | Status: DC
  Administered 2015-06-25: 02:00:00 via INTRAVENOUS

## 2015-06-21 MED ORDER — DOBUTAMINE IN D5W 4-5 MG/ML-% IV SOLN
1.0000 ug/kg/min | INTRAVENOUS | Status: DC
  Administered 2015-06-21: 3 ug/kg/min via INTRAVENOUS
  Filled 2015-06-21 (×2): qty 250

## 2015-06-21 NOTE — Care Management Important Message (Signed)
Important Message  Patient Details  Name: [name] MRN: [id number] Date of Birth: 12-09-1965   Medicare Important Message Given:  Yes-third notification given    Orson Aloe 06/21/2015, 1:37 PM

## 2015-06-21 NOTE — Progress Notes (Signed)
Patient ID: [name], female   DOB: [birth year], 49 y.o.   MRN: [id number] S:No new complaints, more awake/alert today O:BP 109/77 mmHg  Pulse 83  Temp(Src) 97.9 F (36.6 C) (Oral)  Resp 20  Ht  (1.651 m)  Wt 114.8 kg (253 lb 1.4 oz)  BMI 42.12 kg/m2  SpO2 100%  LMP 01/19/2014  Intake/Output Summary (Last 24 hours) at 06/21/15 0839 Last data filed at 06/21/15 0800  Gross per 24 hour  Intake 1007.9 ml  Output   3217 ml  Net -2209.1 ml   Intake/Output: I/O last 3 completed shifts: In: 1390 [P.O.:960; I.V.:430] Out: 4446 [Urine:705; Other:3741]  Intake/Output this shift:  Total I/O In: 9.5 [I.V.:9.5] Out: 86 [Other:86] Weight change: -1.2 kg (-2 lb 10.3 oz) Gen:WD obese AAF in NAD CVS:no rub Resp:cta WUJ:WJXBJY NWG:NFAOZ pretibial edema   Recent Labs Lab 09-Jul-2015 1653  06/17/15 0450  06/18/15 0435 06/18/15 1530 06/19/15 0350 06/19/15 1550 06/20/15 0405 06/20/15 1545 06/21/15 0420  NA 130*  < > 131*  < > 134* 135  138 134* 132* 130* 132* 135  K 4.9  < > 3.9  < > 4.0 3.7  3.9 3.7 3.6 3.8 3.8 4.0  CL 87*  < > 90*  < > 98* 97* 98* 96* 93* 96* 99*  CO2 29  < > 29  < > GLUCOSE 69  < > 107*  < > 105* 168* 152* 194* 188* 174* 98  BUN 86*  < > 51*  < > 27* 21* CREATININE 2.23*  < > 1.90*  < > 1.26* 1.29* 1.19* 1.28* 1.28* 1.29* 1.07*  ALBUMIN 3.6  < > 3.0*  < > 2.8* 2.8* 2.7* 2.7* 2.5* 2.7* 2.7*  CALCIUM 8.7*  < > 8.5*  < > 8.2* 7.9* 8.1* 8.0* 8.1* 8.1* 8.5*  PHOS  --   < > 4.1  < > 2.3* 4.1 2.3* 2.5 1.9* 1.9* 1.7*  AST 80*  --  188*  --   --   --   --   --   --   --  81*  ALT 42  --  96*  --   --   --   --   --   --   --  89*  < > = values in this interval not displayed. Liver Function Tests:  Recent Labs Lab 07-09-2015 1653  06/17/15 0450  06/20/15 0405 06/20/15 1545 06/21/15 0420  AST 80*  --  188*  --   --   --  81*  ALT 42  --  96*  --   --   --  89*  ALKPHOS 121  --  127*  --   --   --  119  BILITOT 2.4*   --  2.6*  --   --   --  1.4*  PROT 8.3*  --  6.9  --   --   --  7.4  ALBUMIN 3.6  < > 3.0*  < > 2.5* 2.7* 2.7*  < > = values in this interval not displayed.  Recent Labs Lab 2015-07-09 1653  LIPASE 35    Recent Labs Lab 06/17/15 0807  AMMONIA 32   CBC:  Recent Labs Lab 06/16/15 0513 06/17/15 0450 06/18/15 0400 06/18/15 1530 06/20/15 0405 06/21/15 0420  WBC 5.2 6.3 8.4  --  5.6 4.6  HGB 11.8* 11.5* 11.8* 17.3* 11.6* 11.7*  HCT 38.1 36.7 37.2 51.0* 36.7 36.8  MCV 96.0 95.6 96.1  --  94.3 93.2  PLT 280 282 275  --  241 269   Cardiac Enzymes:  Recent Labs Lab 06-17-15 1645  TROPONINI <0.03   CBG:  Recent Labs Lab 06/20/15 0734 06/20/15 1145 06/20/15 1542 06/20/15 2349 06/21/15 0413  GLUCAP 95 84 156* 103* 86    Iron Studies: No results for input(s): IRON, TIBC, TRANSFERRIN, FERRITIN in the last 72 hours. Studies/Results: No results found. Marland Kitchen allopurinol  300 mg Oral Daily  . amiodarone  200 mg Oral BID  . aspirin EC  81 mg Oral Daily  . furosemide  160 mg Oral Q6H  . gabapentin  600 mg Oral QID  . heparin subcutaneous  5,000 Units Subcutaneous 3 times per day  . insulin aspart  0-15 Units Subcutaneous TID WC  . magnesium oxide  400 mg Oral BID  . pantoprazole  40 mg Oral Daily  . ranolazine  500 mg Oral BID  . sildenafil  40 mg Oral TID  . sodium chloride  10-40 mL Intracatheter Q12H    BMET    Component Value Date/Time   NA 135 06/21/2015 0420   K 4.0 06/21/2015 0420   CL 99* 06/21/2015 0420   CO2 28 06/21/2015 0420   GLUCOSE 98 06/21/2015 0420   BUN 11 06/21/2015 0420   CREATININE 1.07* 06/21/2015 0420   CREATININE 0.83 02/06/2012 1520   CALCIUM 8.5* 06/21/2015 0420   GFRNONAA 60* 06/21/2015 0420   GFRAA >60 06/21/2015 0420   CBC    Component Value Date/Time   WBC 4.6 06/21/2015 0420   RBC 3.95 06/21/2015 0420   RBC 4.48 04/26/2011 1213   HGB 11.7* 06/21/2015 0420   HCT 36.8 06/21/2015 0420   PLT 269 06/21/2015 0420   MCV 93.2  06/21/2015 0420   MCH 29.6 06/21/2015 0420   MCHC 31.8 06/21/2015 0420   RDW 16.8* 06/21/2015 0420   LYMPHSABS 0.9 06/09/2015 0845   MONOABS 0.4 06/09/2015 0845   EOSABS 0.1 06/09/2015 0845   BASOSABS 0.0 06/09/2015 0845    Assessment/Plan:  1. AKI/CKD- related to cardiorenal syndrome/cardiogenic shock-  Has been non-oliguric with high dose lasix however UOP decreasing.  Currently on CVVHD with plan to transition to IHD if her renal function does not improve, however pt poor candidate for longterm hemodialysis.  Agree with palliative care involvement to help set goals/limits of care.  She is making urine, so hopefully she will start to recover renal function. 2. Acute on chronic systolic CHF due to NICMP (EF 44-31%) had been on home milrinone and large doses of diuretics.  Ultrafiltered 27 lbs since admission but still with evidence of volume overload.  Plan for dobutamine per CHF team.  Responding some to IV Lasix but with progressively less UOP.   3. Cardiogenic shock- transition to dobutamine per Cards 4. Recurrent VT/VF- on ranolazine and amiodarone.  Plan per cards 5. Pulmonary htn-on sildenafil 6. Morbid obesity 7. Disposition- poor overall prognosis with the development of renal failure.  Agree with palliative care consult   Nguyet Mercer A

## 2015-06-21 NOTE — Progress Notes (Addendum)
CRRT stopped this evening per Dr Arrie Aran. Blood returned to patient and catheter hepain locked.

## 2015-06-21 NOTE — Progress Notes (Signed)
Patient ID: [name], female   DOB: 1966/06/29, 49 y.o.   MRN: [id number]   SUBJECTIVE: Patient was admitted with nausea, vomiting, and weight gain. Pulmonary edema on CXR.    Remains on CVVHD this am at ~ 90 ml/hr, plan to run until filter is finished. Weight down 27 pounds overall. CVP remains 16. UO 405cc yesterday. Co-ox 59.9%. Feels a lot better.  Aware that her kidneys may not take over on their own once CVVHD is d/c'd.   Scheduled Meds: . allopurinol  300 mg Oral Daily  . amiodarone  200 mg Oral BID  . aspirin EC  81 mg Oral Daily  . furosemide  160 mg Oral Q6H  . gabapentin  600 mg Oral QID  . heparin subcutaneous  5,000 Units Subcutaneous 3 times per day  . insulin aspart  0-15 Units Subcutaneous TID WC  . magnesium oxide  400 mg Oral BID  . pantoprazole  40 mg Oral Daily  . ranolazine  500 mg Oral BID  . sildenafil  40 mg Oral TID  . sodium chloride  10-40 mL Intracatheter Q12H   Continuous Infusions: . heparin 10,000 units/ 20 mL infusion syringe 650 Units/hr (06/21/15 0700)  . milrinone 0.25 mcg/kg/min (06/21/15 0415)  . dialysis replacement fluid (prismasate) 800 mL/hr at 06/21/15 0543  . dialysis replacement fluid (prismasate) 700 mL/hr at 06/21/15 0628  . dialysate (PRISMASATE) 1,500 mL/hr at 06/21/15 0628   PRN Meds:.albuterol, cyclobenzaprine, heparin, heparin, ondansetron (ZOFRAN) IV, oxyCODONE-acetaminophen **AND** oxyCODONE, phenol, sodium chloride, sodium chloride    Filed Vitals:   06/21/15 0400 06/21/15 0500 06/21/15 0600 06/21/15 0700  BP: 96/72 113/78 91/49 109/77  Pulse:      Temp: 97.4 F (36.3 C)     TempSrc: Oral     Resp: 16 18 20 18   Height:      Weight:  253 lb 1.4 oz (114.8 kg)    SpO2: 97% 99% 98% 98%    Intake/Output Summary (Last 24 hours) at 06/21/15 0723 Last data filed at 06/21/15 0700  Gross per 24 hour  Intake 1118.4 ml  Output   3231 ml  Net -2112.6 ml    LABS: Basic Metabolic Panel:  Recent Labs  14/10/30 0405  06/20/15 1545 06/21/15 0420  NA 130* 132* 135  K 3.8 3.8 4.0  CL 93* 96* 99*  CO2 28 26 28   GLUCOSE 188* 174* 98  BUN 14 12 11   CREATININE 1.28* 1.29* 1.07*  CALCIUM 8.1* 8.1* 8.5*  MG 2.4  --  2.4  PHOS 1.9* 1.9* 1.7*   Liver Function Tests:  Recent Labs  06/20/15 1545 06/21/15 0420  AST  --  81*  ALT  --  89*  ALKPHOS  --  119  BILITOT  --  1.4*  PROT  --  7.4  ALBUMIN 2.7* 2.7*   No results for input(s): LIPASE, AMYLASE in the last 72 hours. CBC:  Recent Labs  06/20/15 0405 06/21/15 0420  WBC 5.6 4.6  HGB 11.6* 11.7*  HCT 36.7 36.8  MCV 94.3 93.2  PLT 241 269   Cardiac Enzymes: No results for input(s): CKTOTAL, CKMB, CKMBINDEX, TROPONINI in the last 72 hours. BNP: Invalid input(s): POCBNP D-Dimer: No results for input(s): DDIMER in the last 72 hours. Hemoglobin A1C: No results for input(s): HGBA1C in the last 72 hours. Fasting Lipid Panel: No results for input(s): CHOL, HDL, LDLCALC, TRIG, CHOLHDL, LDLDIRECT in the last 72 hours. Thyroid Function Tests: No results for input(s): TSH,  T4TOTAL, T3FREE, THYROIDAB in the last 72 hours.  Invalid input(s): FREET3 Anemia Panel: No results for input(s): VITAMINB12, FOLATE, FERRITIN, TIBC, IRON, RETICCTPCT in the last 72 hours.  RADIOLOGY: Ct Abdomen Pelvis Wo Contrast  07/10/2015   CLINICAL DATA:  Uncontrolled vomiting for 5 days  EXAM: CT ABDOMEN AND PELVIS WITHOUT CONTRAST  TECHNIQUE: Multidetector CT imaging of the abdomen and pelvis was performed following the standard protocol without IV contrast.  COMPARISON:  01/20/2015  FINDINGS: Subsegmental atelectasis at the left lung base. Severe cardiomegaly. AICD device.  Postcholecystectomy  The left lobe of the liver is prominent but there is no evidence of nodularity of the contour of the liver.  Spleen, pancreas, adrenal glands, and kidneys are within normal limits.  There is significant stranding throughout the subcutaneous fat across the abdomen.  Bladder and  uterus are within normal limits. There is a 1.9 cm hyperdense area within the left adnexa of unknown significance. It may represent an ovarian complex cyst.  There is a small amount of free fluid layering in the pelvis. There is no obvious retroperitoneal adenopathy.  IMPRESSION: Small amount of free fluid in the pelvis is nonspecific.  1.9 cm hyperdense abnormality in the left adnexa may represent a lesion in the ovary. Pelvic ultrasound is warranted.  There is edema within the subcutaneous fat across the abdomen of unknown significance.  Left basilar subsegmental atelectasis.   Electronically Signed   By: Jolaine Click M.D.   On: 06/28/2015 19:02   Dg Chest Portable 1 View  06/16/2015   CLINICAL DATA:  49 year old female central line placement. Initial encounter.  EXAM: PORTABLE CHEST 1 VIEW  COMPARISON:  07/08/2015 and earlier.  FINDINGS: Portable AP semi upright view at 1154 hours. Continued low lung volumes. Pulmonary vascular congestion has regressed. Stable left chest cardiac AICD. Stable cardiac size and mediastinal contours.  Right PICC line remains in place, tip projects at the level of the medial right subclavian vein. An enteric tube is now in place in courses to the epigastrium, tip not included. There is a new left IJ approach dual lumen dialysis type catheter. Catheter tips project at the cavoatrial junction level. No pneumothorax. Continued retrocardiac opacity.  IMPRESSION: 1. Left IJ approach dual lumen dialysis type catheter placed with no adverse features. 2. Enteric tube placed, courses to the abdomen tip not included. 3. Stable right PICC line. 4. Interval regressed pulmonary edema.   Electronically Signed   By: Odessa Fleming M.D.   On: 06/16/2015 12:04   Dg Chest Portable 1 View  07/06/2015   CLINICAL DATA:  Bradycardia, CHF, chronic milrinone infusion, nausea and vomiting  EXAM: PORTABLE CHEST 1 VIEW  COMPARISON:  05/26/2015  FINDINGS: Right PICC line tip remains at the right innominate vein  as before. Left subclavian multilead defibrillator/pacer evident. Heart is enlarged with vascular and interstitial prominence suggesting mild edema. Limited exam because of body habitus. Increased left base density suggest chronic atelectasis when compared to the prior CT.  IMPRESSION: Cardiomegaly with increased vascular and interstitial prominence suggesting mild developing edema pattern compare to 05/26/2015.   Electronically Signed   By: Judie Petit.  Shick M.D.   On: 06/16/2015 17:25   Dg Chest Port 1 View  05/26/2015   CLINICAL DATA:  Shortness of breath and syncope 1 day.  EXAM: PORTABLE CHEST - 1 VIEW  COMPARISON:  05/16/2015  FINDINGS: Right-sided PICC line has been pulled back slightly with tip overlying the region of the SVC near the junction of the subclavian  vein. Left-sided pacemaker unchanged.  Lungs are hypoinflated with mild prominence of the perihilar markings suggesting mild vascular congestion. There is mild stable cardiomegaly. Remainder of the exam is unchanged.  IMPRESSION: Stable mild cardiomegaly with evidence of mild vascular congestion.  Right-sided PICC line has been pulled back slightly with tip over the region of the SVC near the junction with the subclavian vein.   Electronically Signed   By: Elberta Fortis M.D.   On: 05/26/2015 21:02   Dg Abd Portable 1v  06/16/2015   CLINICAL DATA:  Status post nasogastric catheter placement  EXAM: PORTABLE ABDOMEN - 1 VIEW  COMPARISON:  None.  FINDINGS: Scattered large and small bowel gas is noted. A nasogastric catheter is noted deep within the stomach.  IMPRESSION: Nasogastric catheter within the stomach.   Electronically Signed   By: Alcide Clever M.D.   On: 06/16/2015 09:28    PHYSICAL EXAM CVP 16 General: No resp distress Neck: Thick, JVP difficult but appears elevated, no thyromegaly or thyroid nodule.  Lungs: Clear CV: Nondisplaced PMI.  Heart regular S1/S2, no S3/S4, no murmur appreciated.  1-2+ edema to knees.   Abdomen: Soft, NT, no  HSM, mild distention.  Neurologic: Alert and oriented x 3.  Psych: awake, interactive. Normal affect Extremities: No clubbing or cyanosis.   TELEMETRY:  NSR 70s with BiV pacing.  ASSESSMENT AND PLAN: 1. Acute on chronic systolic CHF: Nonischemic cardiomyopathy, s/p [employer] CRT-D. EF 30-35% (03/2015 - on milrinone). Has been on home milrinone 0.375 mcg/kg/min. NYHA class IIIb symptoms chronically. Admitted with elevated weight, nausea/vomiting, and pulmonary edema on CXR.  Volume has been very difficult to control at home despite large diuretic doses at home.  Nephrology consulted. Started on CVVHD. Weight down 27 pounds but CVP still up, 16 this morning.  Co-ox 60%.  - Plan to stop CVVH later today.  With her degree of renal failure, likely will be safer to be on dobutamine than milrinone.  Stop milrinone, start dobutamine 3 mcg/kg/min.  Long-term IHD will be difficult.  - She is now on Lasix 160 mg every 6 hrs.   - Poor LVAD candidate with an uncertain level of social support and size/weight. Referred her to [facility] for 2nd opinion but had not yet gone.  Unfortunately, options are running low at this point.  2. Cardiogenic shock: Transitioning to dobutamine with renal failure as above.  3. VT/VF, recurrent: Ranolazine 500 mg bid and amiodarone 200 mg bid. Will need to discuss with pharmacist what to do about ranolazine given her renal failure.  4. H/O GI bleed with AVM 5. Pulmonary HTN: R[facility] 05/01/15 PVR 4.0, improved. Continue current dose of sildenafil.  6. AKI on CKD: As above, suspect advancing cardiorenal syndrome.   - CVVH to stop later today.  Will see how she does on high dose Lasix.  Difficult IHD candidate.   Will need to move toward Palliative Care if worsens. Consider palliative consult.  [name], PA-C 7:23 AM Advanced Heart Failure Team Pager [phone] (M-F; 7a - 4p)  Please contact [facility] for night-coverage after hours (4p -7a ) and weekends  on [email or web address]  Patient seen with PA, agree with the above note.  Mrs Kardell has done well with CVVH, weight down significantly and breathing better.  CVP remains 16 but unlikely to get down to normal range => 16 is a considerable improvement.    Plan to stop CVVH later today.  She is on high dose Lasix.  Will have  to see how she does => however, she would be a difficult long-term IHD candidate.  If does not have renal recovery, will need palliative care discussion.   Transition to dobutamine with her current degree of renal dysfunction (risk of milrinone accumulation).   Discuss with pharmacy regarding the ranolazine.   35 minutes critical care time.   [name] 06/21/2015 8:34 AM

## 2015-06-22 DIAGNOSIS — I5023 Acute on chronic systolic (congestive) heart failure: Secondary | ICD-10-CM

## 2015-06-22 DIAGNOSIS — Z7189 Other specified counseling: Secondary | ICD-10-CM

## 2015-06-22 LAB — CARBOXYHEMOGLOBIN
CARBOXYHEMOGLOBIN: 2 % — AB (ref 0.5–1.5)
METHEMOGLOBIN: 0.8 % (ref 0.0–1.5)
O2 Saturation: 69.4 %
Total hemoglobin: 12.1 g/dL (ref 12.0–16.0)

## 2015-06-22 LAB — GLUCOSE, CAPILLARY
GLUCOSE-CAPILLARY: 93 mg/dL (ref 65–99)
GLUCOSE-CAPILLARY: 96 mg/dL (ref 65–99)
Glucose-Capillary: 113 mg/dL — ABNORMAL HIGH (ref 65–99)
Glucose-Capillary: 103 mg/dL — ABNORMAL HIGH (ref 65–99)

## 2015-06-22 LAB — APTT: aPTT: 41 s — ABNORMAL HIGH (ref 24–37)

## 2015-06-22 LAB — RENAL FUNCTION PANEL
Albumin: 2.9 g/dL — ABNORMAL LOW (ref 3.5–5.0)
Anion gap: 7 (ref 5–15)
BUN: 19 mg/dL (ref 6–20)
CO2: 27 mmol/L (ref 22–32)
Calcium: 8.4 mg/dL — ABNORMAL LOW (ref 8.9–10.3)
Chloride: 94 mmol/L — ABNORMAL LOW (ref 101–111)
Creatinine, Ser: 2.01 mg/dL — ABNORMAL HIGH (ref 0.44–1.00)
GFR calc Af Amer: 32 mL/min — ABNORMAL LOW
GFR calc non Af Amer: 28 mL/min — ABNORMAL LOW
Glucose, Bld: 114 mg/dL — ABNORMAL HIGH (ref 65–99)
Phosphorus: 2.7 mg/dL (ref 2.5–4.6)
Potassium: 4.6 mmol/L (ref 3.5–5.1)
Sodium: 128 mmol/L — ABNORMAL LOW (ref 135–145)

## 2015-06-22 LAB — MAGNESIUM: MAGNESIUM: 2.5 mg/dL — AB (ref 1.7–2.4)

## 2015-06-22 MED ORDER — ALPRAZOLAM 0.25 MG PO TABS
0.2500 mg | ORAL_TABLET | Freq: Three times a day (TID) | ORAL | Status: DC | PRN
  Administered 2015-06-22: 0.25 mg via ORAL
  Filled 2015-06-22: qty 1

## 2015-06-22 MED ORDER — ALBUTEROL SULFATE (2.5 MG/3ML) 0.083% IN NEBU: 2.5000 mg | INHALATION_SOLUTION | Freq: Four times a day (QID) | RESPIRATORY_TRACT | Status: DC | PRN

## 2015-06-22 MED ORDER — WHITE PETROLATUM GEL
Status: AC
  Administered 2015-06-22: 12:00:00
  Filled 2015-06-22: qty 1

## 2015-06-22 MED ORDER — GABAPENTIN 300 MG PO CAPS
600.0000 mg | ORAL_CAPSULE | Freq: Two times a day (BID) | ORAL | Status: DC
  Administered 2015-06-22 – 2015-06-24 (×5): 600 mg via ORAL
  Filled 2015-06-22 (×5): qty 2

## 2015-06-22 MED ORDER — WHITE PETROLATUM GEL: 1.0000 "application " | Status: DC | PRN

## 2015-06-22 MED ORDER — ENOXAPARIN SODIUM 40 MG/0.4ML ~~LOC~~ SOLN: 40.0000 mg | SUBCUTANEOUS | Status: DC

## 2015-06-22 MED ORDER — ALPRAZOLAM 0.5 MG PO TABS
0.5000 mg | ORAL_TABLET | Freq: Three times a day (TID) | ORAL | Status: DC
  Administered 2015-06-22 – 2015-06-24 (×5): 0.5 mg via ORAL
  Filled 2015-06-22 (×4): qty 1
  Filled 2015-06-22: qty 2

## 2015-06-22 NOTE — Consult Note (Signed)
Consultation Note Date: 06/22/2015   Patient Name: [name]  DOB: [birth year]  MRN: [id number]  Age / Sex: 49 y.o., female   PCP: Barbara Morgan, MD Referring Physician: Laurey Morale, MD  Reason for Consultation: Establishing goals of care and Psychosocial/spiritual support  Palliative Care Assessment and Plan Summary of Established Goals of Care and Medical Treatment Preferences   Clinical Assessment/Narrative: [name] is sitting up in her chair today.  She makes and keeps eye contact.  She is tearful while telling me about her current condition.  Her parents from [place] call and she is able to talk with them for about 10 minutes, she tells them, "my time is limited".   They are scheduled to arrive today or tomorrow.  We talk about HCPOA, (father, [name]), Advanced directives (see below), and she tells me her first priority is her 32 year old daughter.  Her desire is for guardianship papers to be completed naming [name] in [name] [phone] or [phone]).  She tells me that her daughter is ok with this.  [name] asks that PMT talk with her daughter [name] about the seriousness of her illness before [name].  She tells me that her wish is for her daughter to come out of school to spend as much time with her as possible. [name] tells me about when she last saw [name], "I didn't know how severe" her illness was.  She does call her daughter every morning and evening.   She has another 10 minute call with her boyfriend, "[name]", who she also tells, "my time is limited".   [name] asks about if she will be able to leave the hospital; and if so, where would she go.  We discuss hospice and [name] tells me that she wants to go to the hospice in [facility].   [name] talks about her life insurance and trying to choose a burial site.  Her co-pastor [name] [phone]) will help her with this.  Call to SW [name] who tells me they do  not have listing of attorneys who will come to the hospital to help complete the guardianship papers and the durable POA.  Suggestions are that her father and mother help her with this. PMT will also assist with understanding the legal requirements for completing these documents.   Contacts/Participants in Discussion: Primary Decision Maker: [name] is able to make her own decisions at this time.  HCPOA: no , She names her Father, [name] as her HCPOA and wishes to complete paperwork as soon as possible. Referral to spiritual care for HCPOA    Code Status/Advance Care Planning:  FULL CODE at this time  We discuss the realities of life support in her current condition and the pain this would cause both she and her family.   [name] states "If my brain isn't working don't try to keep me alive".  We discuss encephalopathy from renal failure and I ask if her confution from kidney failure is not improving and won't improve then we should not take extreme measures and [name] says YES.   I encourage her to have this conversation with her HCPOA,, her father.    Symptom Management:   Oxycodone 5 mg PO Q 4 hours PRN  Alprazolam 0.25 mg po TID PRN; INCREASED to alprazolam 0.5 mg PO Q 8 hours scheduled.   Flexeril 10 mg PO TID PRN  Zofran 4 mg IV  Q 6 hours PRN.   Palliative Prophylaxis: none at this time, recommend Senna-S 2 tabs Q HS.   Psycho-social/Spiritual:   Support System: Lives at home alone  Desire for further Chaplaincy support: Continues ongoing   Prognosis: Unable to determine at this time. If kidney function continues to worsen, likely weeks at most.   Discharge Planning:  Hospice facility [name] states her goal, if she is able to leave the hospital, is to go to [facility] in patient hospice.        Chief Complaint: Nausea for a week  History of present illness:  This is a 49 y.o. 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 She is on chronic home inotropic infusion (milrinone 0.375 g/kilogram/minute) and has been deemed to be a poor candidate for either cardiac transplantation or LVAD implantation, at least in part due to poor social support system. 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  She has had intractable nausea and vomiting for roughly a week now as well as frequent watery stools (about twice a day, not true diarrhea, no active bleeding). She thinks it is very likely that she has thrown up many of her medications. The vomiting is a more serious problem to her than the runny stools. Despite poor by mouth intake she is roughly 15 pounds above her "dry weight" 263-2 and 64 pounds. She has developed substantial lower extremity edema and abdominal distention and has frank orthopnea. In addition she complains of uncontrollable myoclonus of her upper extremities and occasionally her feet. She has not had defibrillator discharges that she is aware of.  Just this year she has had 2 separate hospitalizations for ventricular arrhythmia storm, April and July. She is currently receiving amiodarone and ranolazine. Recently digoxin and Corlanor were stopped for side effects.  Additional comorbidities include morbid obesity, obstructive sleep apnea on CPAP, type 2 diabetes mellitus on insulin, essential hypertension, chronic renal insufficiency, at least in part related to reduced cardiac output. Recently her creatinine has been around 2.0, although last year her creatinine was normal. Most recent echocardiogram shows left ventricular ejection fraction 30-35 percent, stable over the last 12-18 months. The left ventricle severely dilated and there is evidence of  restrictive diastolic filling. She has been treated with CVVHD to manage acute kidney injury, ( 26 pound reduction) but this is not a long term solution.  Her heart function is not enough to support her with conventional dialysis.    Primary Diagnoses  Present on Admission:  . Acute on chronic combined systolic and diastolic congestive heart failure, NYHA class 4 (HCC) . Systolic CHF, acute on chronic (HCC) . OSA on CPAP . Essential hypertension  Palliative Review of Systems: Barbara. Schueler states she is having pain, but this is "not physical, but of the heart".  She tells me that her PRN pain medication is effective.  Barbara. Coldren tells me that she has increased anxiety and her current dosage and scheduling is not effective at managing her anxiety, nor does the dosage last long enough.  I have reviewed the medical record, interviewed the patient and family, and examined the patient. The following aspects are pertinent.  Past Medical History  Diagnosis Date  . CHF (congestive heart failure) (HCC)     a. EF 30-35%, RV mildly dilated (  difficult study 11/2013) b. RHC (12/2013): RA 38/37 (35), RV 90/28, PA 95/51 (71), PCWP 54, PA 40%, AO 96 %, CO/CI Fick 3.2/1.4, PVR 5.3   . Nonischemic cardiomyopathy (HCC) 12/2013    a. LHC (12/2013) normal coronary anatomy   . Mitral regurgitation     moderate to severe  . LBBB (left bundle branch block)   . HTN (hypertension)   . Asthma   . IBS (irritable bowel syndrome)     with primarily constipation  . Morbid obesity (HCC)   . GERD (gastroesophageal reflux disease)   . IDA (iron deficiency anemia)     2o TO SB AVMS, Hx parenteral iron Dr Mariel Sleet  . AVM (arteriovenous malformation)     Small bowel, s/p duble balloon enteroscopy/APC jejunum Dr Gwinda [facility]) 01/23/2011  . Peripheral neuropathy (HCC)   . OSA (obstructive sleep apnea)     on CPAP qhs  . Diabetes mellitus without complication (HCC)   . Torsades de pointes (HCC)     a. appropriate ICD  therapy 12/2014 in setting of hypokalemia  . Pneumonia    Social History   Social History  . Marital Status: Single    Spouse Name: [name]  . Number of Children: 1  . Years of Education: [name]   Occupational History  . disabled    Social History Main Topics  . Smoking status: Former Smoker -- 0.00 packs/day for 0 years    Types: Cigarettes  . Smokeless tobacco: Former Neurosurgeon     Comment: Quit x 4 years  . Alcohol Use: No  . Drug Use: No  . Sexual Activity: Not Asked   Other Topics Concern  . None   Social History Narrative   Disability for heart disease. Was a CNA.   Single- 1 daughter age 81.    Does not get regular exercise.     Family History  Problem Relation Age of Onset  . Colon cancer Paternal Uncle   . Cervical cancer Mother   . Hypertension Mother   . Cancer Mother   . Heart disease Father   . Hypertension Father   . GER disease Father   . Heart attack Father   . Bleeding Disorder Father   . Diabetes Sister   . Heart disease Sister   . Hypertension Sister    Scheduled Meds: . allopurinol  300 mg Oral Daily  . ALPRAZolam  0.5 mg Oral TID  . amiodarone  200 mg Oral BID  . aspirin EC  81 mg Oral Daily  . furosemide  160 mg Oral Q6H  . gabapentin  600 mg Oral BID  . heparin subcutaneous  5,000 Units Subcutaneous 3 times per day  . insulin aspart  0-15 Units Subcutaneous TID WC  . magnesium oxide  400 mg Oral BID  . pantoprazole  40 mg Oral Daily  . sildenafil  40 mg Oral TID  . sodium chloride  10-40 mL Intracatheter Q12H  . white petrolatum       Continuous Infusions: . sodium chloride    . DOBUTamine 3 mcg/kg/min (06/21/15 1478)  . heparin 10,000 units/ 20 mL infusion syringe 650 Units/hr (06/21/15 1611)  . dialysis replacement fluid (prismasate) 800 mL/hr at 06/21/15 1218  . dialysis replacement fluid (prismasate) 700 mL/hr at 06/21/15 1356  . dialysate (PRISMASATE) 1,500 mL/hr at 06/21/15 1355   PRN Meds:.albuterol, cyclobenzaprine, heparin,  heparin, ondansetron (ZOFRAN) IV, oxyCODONE-acetaminophen **AND** oxyCODONE, phenol, sodium chloride, sodium chloride, white petrolatum Medications Prior to Admission:  Prior  to Admission medications   Medication Sig Start Date End Date Taking? Authorizing Provider  albuterol (PROVENTIL HFA;VENTOLIN HFA) 108 (90 BASE) MCG/ACT inhaler Inhale 2 puffs into the lungs every 6 (six) hours as needed for wheezing or shortness of breath.   Yes Historical Provider, MD  allopurinol (ZYLOPRIM) 300 MG tablet Take 1 tablet (300 mg total) by mouth daily. 08/14/14  Yes [name] D Clegg, NP  amiodarone (PACERONE) 200 MG tablet Take 200 mg by mouth 2 (two) times daily. 04/08/15  Yes Historical Provider, MD  aspirin 81 MG tablet Take 1 tablet (81 mg total) by mouth daily. 06/04/15  Yes Barbara Morale, MD  cyclobenzaprine (FLEXERIL) 10 MG tablet Take 10 mg by mouth 3 (three) times daily as needed for muscle spasms.    Yes Historical Provider, MD  docusate sodium (COLACE) 100 MG capsule Take 1 capsule (100 mg total) by mouth 2 (two) times daily as needed for mild constipation. Patient taking differently: Take 100 mg by mouth 2 (two) times daily.  04/28/15  Yes [name] D Clegg, NP  DULoxetine (CYMBALTA) 30 MG capsule Take 30 mg by mouth daily. 02/24/15  Yes Historical Provider, MD  ferrous sulfate 325 (65 FE) MG tablet Take 325 mg by mouth 3 (three) times daily.   Yes Historical Provider, MD  gabapentin (NEURONTIN) 300 MG capsule Take 600 mg by mouth 4 (four) times daily.   Yes Historical Provider, MD  hydrALAZINE (APRESOLINE) 25 MG tablet Take 0.5 tablets (12.5 mg total) by mouth every 8 (eight) hours. 04/28/15  Yes [name] D Clegg, NP  insulin aspart (NOVOLOG) 100 UNIT/ML injection Inject 0-20 Units into the skin 3 (three) times daily with meals. Patient taking differently: Inject 0-20 Units into the skin 3 (three) times daily with meals. Per sliding scale: 120 - 150 = 2 units, 151 - 200 = 4 units, 201 - 250 = 7 units, 251 - 300 = 11  units, 301 - 350 = 15 units, 351 and above = 20 units 06/28/12  Yes Barbara Morgan, MD  insulin detemir (LEVEMIR) 100 UNIT/ML injection Inject 0.58 mLs (58 Units total) into the skin 2 (two) times daily. 04/08/15  Yes [name] D Clegg, NP  Linaclotide (LINZESS) 290 MCG CAPS capsule Take 1 capsule (290 mcg total) by mouth daily. 30 minutes before breakfast 02/12/15  Yes Nira Retort, NP  magnesium oxide (MAG-OX) 400 (241.3 MG) MG tablet Take 1 tablet (400 mg total) by mouth 2 (two) times daily. 04/15/15  Yes [name] D Clegg, NP  metolazone (ZAROXOLYN) 2.5 MG tablet Take 1 tablet (2.5 mg total) by mouth every other day. 05/27/15  Yes Barbara Morale, MD  milrinone [facility]) 20 MG/100ML SOLN infusion Inject 47.5125 mcg/min into the vein continuous. 04/28/15  Yes [name] D Clegg, NP  omeprazole (PRILOSEC) 40 MG capsule Take 1 capsule (40 mg total) by mouth daily. 03/27/13  Yes Marinus Maw, MD  oxyCODONE-acetaminophen (PERCOCET) 10-325 MG per tablet Take 0.5-1 tablets by mouth every 4 (four) hours as needed for pain.    Yes Historical Provider, MD  potassium chloride SA (K-DUR,KLOR-CON) 20 MEQ tablet Take 4 tables (80 meq in am) take 2 tablets (40 meq) at lunch and take 4 tablets (80 meq) at bedtime 06/10/15  Yes Barbara Morale, MD  ranolazine (RANEXA) 500 MG 12 hr tablet Take 1 tablet (500 mg total) by mouth 2 (two) times daily. 04/08/15  Yes [name] D Clegg, NP  sildenafil (REVATIO) 20 MG tablet Take 2 tablets (40  mg total) by mouth 3 (three) times daily. 05/27/15  Yes Barbara Morale, MD  spironolactone (ALDACTONE) 25 MG tablet TAKE 1 TABLET BY MOUTH TWICE DAILY. 05/14/15  Yes [name] D Clegg, NP  temazepam (RESTORIL) 30 MG capsule Take 30 mg by mouth at bedtime.   Yes Historical Provider, MD  torsemide (DEMADEX) 100 MG tablet Take 1 tablet (100 mg total) by mouth 2 (two) times daily. 04/15/15  Yes [name] D Clegg, NP  vitamin E 400 UNIT capsule Take 400 Units by mouth daily.   Yes Historical Provider, MD   Allergies  Allergen Reactions   . Cymbalta [Duloxetine Hcl] Other (See Comments)    Only in a mild dose pt can tolerate  . Trazodone And Nefazodone Other (See Comments)    Nightmares   . Lyrica [Pregabalin] Swelling   CBC:    Component Value Date/Time   WBC 4.6 06/21/2015 0420   HGB 11.7* 06/21/2015 0420   HCT 36.8 06/21/2015 0420   PLT 269 06/21/2015 0420   MCV 93.2 06/21/2015 0420   NEUTROABS 4.2 06/09/2015 0845   LYMPHSABS 0.9 06/09/2015 0845   MONOABS 0.4 06/09/2015 0845   EOSABS 0.1 06/09/2015 0845   BASOSABS 0.0 06/09/2015 0845   Comprehensive Metabolic Panel:    Component Value Date/Time   NA 128* 06/22/2015 0430   K 4.6 06/22/2015 0430   CL 94* 06/22/2015 0430   CO2 27 06/22/2015 0430   BUN 19 06/22/2015 0430   CREATININE 2.01* 06/22/2015 0430   CREATININE 0.83 02/06/2012 1520   GLUCOSE 114* 06/22/2015 0430   CALCIUM 8.4* 06/22/2015 0430   AST 81* 06/21/2015 0420   ALT 89* 06/21/2015 0420   ALKPHOS 119 06/21/2015 0420   BILITOT 1.4* 06/21/2015 0420   PROT 7.4 06/21/2015 0420   ALBUMIN 2.9* 06/22/2015 0430    Physical Exam: Vital Signs: BP 111/77 mmHg  Pulse 76  Temp(Src) 98.3 F (36.8 C) (Oral)  Resp 18  Ht 5\' 5"  (1.651 m)  Wt 116.1 kg (255 lb 15.3 oz)  BMI 42.59 kg/m2  SpO2 96%  LMP 01/19/2014 SpO2: SpO2: 96 % O2 Device: O2 Device: Not Delivered O2 Flow Rate: O2 Flow Rate (L/min): 2 L/min Intake/output summary:  Intake/Output Summary (Last 24 hours) at 06/22/15 1246 Last data filed at 06/22/15 1200  Gross per 24 hour  Intake  844.8 ml  Output    766 ml  Net   78.8 ml   LBM: Last BM Date: 06/19/15 Baseline Weight: Weight: 123.832 kg (273 lb) Most recent weight: Weight: 116.1 kg (255 lb 15.3 oz)  Exam Findings:  Constitutional:  Sitting up in chair, tearful Resp:  Even and non labored.  Cardio: NSR, supported with dobutamine infusion.          Palliative Performance Scale: 30-40% at best.              Additional Data Reviewed: Recent Labs     06/20/15  0405    06/21/15  0420  06/21/15  1625  06/22/15  0430  WBC  5.6   --   4.6   --    --   HGB  11.6*   --   11.7*   --    --   PLT  241   --   269   --    --   NA  130*   < >  135  130*  128*  BUN  14   < >  11  13  19  CREATININE  1.28*   < >  1.07*  1.40*  2.01*   < > = values in this interval not displayed.     Time In: 0945 Time Out:  1100 Time Total:  75 minutes Greater than 50%  of this time was spent counseling and coordinating care related to the above assessment and plan. GOC discussion shared with nursing staff, [name], and SW [place].   Signed by: Katheran Awe, NP  Katheran Awe, NP  06/22/2015, 12:46 PM  Please contact Palliative Medicine Team phone at [phone] for questions and concerns.

## 2015-06-22 NOTE — Progress Notes (Signed)
Patient ID: [name], female   DOB: Jan 03, 1966, 49 y.o.   MRN: [id number]   SUBJECTIVE: Patient was admitted with nausea, vomiting, and weight gain. Pulmonary edema on CXR.    CVVHD stopped 06/21/15. Ultrafiltered 27 pounds overall.   She is up two lbs and has had poor urine output since cessation of CVVHD. Only ~200 cc of urine overall yesterday.  30-40 cc of brown urine in foley.  Now on Dobutamine @ 3. Cr quickly worsened after stopping CVVHD. 1.4 -> 2.01 Co-ox 69%.   Tearful. No SOB. Swelling is a little better with feet propped up in chair.  Is starting to understand how critically ill she is.  Says she is ok talking to palliative care.     Scheduled Meds: . allopurinol  300 mg Oral Daily  . amiodarone  200 mg Oral BID  . aspirin EC  81 mg Oral Daily  . furosemide  160 mg Oral Q6H  . gabapentin  600 mg Oral TID  . heparin subcutaneous  5,000 Units Subcutaneous 3 times per day  . insulin aspart  0-15 Units Subcutaneous TID WC  . magnesium oxide  400 mg Oral BID  . pantoprazole  40 mg Oral Daily  . sildenafil  40 mg Oral TID  . sodium chloride  10-40 mL Intracatheter Q12H   Continuous Infusions: . sodium chloride    . DOBUTamine 3 mcg/kg/min (06/21/15 4098)  . heparin 10,000 units/ 20 mL infusion syringe 650 Units/hr (06/21/15 1611)  . dialysis replacement fluid (prismasate) 800 mL/hr at 06/21/15 1218  . dialysis replacement fluid (prismasate) 700 mL/hr at 06/21/15 1356  . dialysate (PRISMASATE) 1,500 mL/hr at 06/21/15 1355   PRN Meds:.albuterol, cyclobenzaprine, heparin, heparin, ondansetron (ZOFRAN) IV, oxyCODONE-acetaminophen **AND** oxyCODONE, phenol, sodium chloride, sodium chloride    Filed Vitals:   06/21/15 2200 06/21/15 2300 06/22/15 0000 06/22/15 0449  BP: 102/67 105/79 109/83   Pulse:      Temp:   98.1 F (36.7 C)   TempSrc:   Oral   Resp: Height:      Weight:    255 lb 15.3 oz (116.1 kg)  SpO2: 100% 100% 91%     Intake/Output Summary  (Last 24 hours) at 06/22/15 0716 Last data filed at 06/22/15 0200  Gross per 24 hour  Intake  793.1 ml  Output   1171 ml  Net -377.9 ml    LABS: Basic Metabolic Panel:  Recent Labs  11/91/47 0420 06/21/15 1625 06/22/15 0430  NA 135 130* 128*  K 4.0 3.9 4.6  CL 99* 96* 94*  CO2 GLUCOSE 98 152* 114*  BUN CREATININE 1.07* 1.40* 2.01*  CALCIUM 8.5* 8.1* 8.4*  MG 2.4  --  2.5*  PHOS 1.7* 1.9* 2.7   Liver Function Tests:  Recent Labs  06/21/15 0420 06/21/15 1625 06/22/15 0430  AST 81*  --   --   ALT 89*  --   --   ALKPHOS 119  --   --   BILITOT 1.4*  --   --   PROT 7.4  --   --   ALBUMIN 2.7* 2.8* 2.9*   No results for input(s): LIPASE, AMYLASE in the last 72 hours. CBC:  Recent Labs  06/20/15 0405 06/21/15 0420  WBC 5.6 4.6  HGB 11.6* 11.7*  HCT 36.7 36.8  MCV 94.3 93.2  PLT 241 269   Cardiac Enzymes: No results for input(s): CKTOTAL,  CKMB, CKMBINDEX, TROPONINI in the last 72 hours. BNP: Invalid input(s): POCBNP D-Dimer: No results for input(s): DDIMER in the last 72 hours. Hemoglobin A1C: No results for input(s): HGBA1C in the last 72 hours. Fasting Lipid Panel: No results for input(s): CHOL, HDL, LDLCALC, TRIG, CHOLHDL, LDLDIRECT in the last 72 hours. Thyroid Function Tests: No results for input(s): TSH, T4TOTAL, T3FREE, THYROIDAB in the last 72 hours.  Invalid input(s): FREET3 Anemia Panel: No results for input(s): VITAMINB12, FOLATE, FERRITIN, TIBC, IRON, RETICCTPCT in the last 72 hours.  RADIOLOGY: Ct Abdomen Pelvis Wo Contrast  06/21/2015   CLINICAL DATA:  Uncontrolled vomiting for 5 days  EXAM: CT ABDOMEN AND PELVIS WITHOUT CONTRAST  TECHNIQUE: Multidetector CT imaging of the abdomen and pelvis was performed following the standard protocol without IV contrast.  COMPARISON:  01/20/2015  FINDINGS: Subsegmental atelectasis at the left lung base. Severe cardiomegaly. AICD device.  Postcholecystectomy  The left lobe of the  liver is prominent but there is no evidence of nodularity of the contour of the liver.  Spleen, pancreas, adrenal glands, and kidneys are within normal limits.  There is significant stranding throughout the subcutaneous fat across the abdomen.  Bladder and uterus are within normal limits. There is a 1.9 cm hyperdense area within the left adnexa of unknown significance. It may represent an ovarian complex cyst.  There is a small amount of free fluid layering in the pelvis. There is no obvious retroperitoneal adenopathy.  IMPRESSION: Small amount of free fluid in the pelvis is nonspecific.  1.9 cm hyperdense abnormality in the left adnexa may represent a lesion in the ovary. Pelvic ultrasound is warranted.  There is edema within the subcutaneous fat across the abdomen of unknown significance.  Left basilar subsegmental atelectasis.   Electronically Signed   By: Jolaine Click M.D.   On: 06/19/2015 19:02   Dg Chest Portable 1 View  06/16/2015   CLINICAL DATA:  49 year old female central line placement. Initial encounter.  EXAM: PORTABLE CHEST 1 VIEW  COMPARISON:  06/20/2015 and earlier.  FINDINGS: Portable AP semi upright view at 1154 hours. Continued low lung volumes. Pulmonary vascular congestion has regressed. Stable left chest cardiac AICD. Stable cardiac size and mediastinal contours.  Right PICC line remains in place, tip projects at the level of the medial right subclavian vein. An enteric tube is now in place in courses to the epigastrium, tip not included. There is a new left IJ approach dual lumen dialysis type catheter. Catheter tips project at the cavoatrial junction level. No pneumothorax. Continued retrocardiac opacity.  IMPRESSION: 1. Left IJ approach dual lumen dialysis type catheter placed with no adverse features. 2. Enteric tube placed, courses to the abdomen tip not included. 3. Stable right PICC line. 4. Interval regressed pulmonary edema.   Electronically Signed   By: Odessa Fleming M.D.   On:  06/16/2015 12:04   Dg Chest Portable 1 View  06/24/2015   CLINICAL DATA:  Bradycardia, CHF, chronic milrinone infusion, nausea and vomiting  EXAM: PORTABLE CHEST 1 VIEW  COMPARISON:  05/26/2015  FINDINGS: Right PICC line tip remains at the right innominate vein as before. Left subclavian multilead defibrillator/pacer evident. Heart is enlarged with vascular and interstitial prominence suggesting mild edema. Limited exam because of body habitus. Increased left base density suggest chronic atelectasis when compared to the prior CT.  IMPRESSION: Cardiomegaly with increased vascular and interstitial prominence suggesting mild developing edema pattern compare to 05/26/2015.   Electronically Signed   By: Judie Petit.  Shick M.D.  On: 07/08/2015 17:25   Dg Chest Port 1 View  05/26/2015   CLINICAL DATA:  Shortness of breath and syncope 1 day.  EXAM: PORTABLE CHEST - 1 VIEW  COMPARISON:  05/16/2015  FINDINGS: Right-sided PICC line has been pulled back slightly with tip overlying the region of the SVC near the junction of the subclavian vein. Left-sided pacemaker unchanged.  Lungs are hypoinflated with mild prominence of the perihilar markings suggesting mild vascular congestion. There is mild stable cardiomegaly. Remainder of the exam is unchanged.  IMPRESSION: Stable mild cardiomegaly with evidence of mild vascular congestion.  Right-sided PICC line has been pulled back slightly with tip over the region of the SVC near the junction with the subclavian vein.   Electronically Signed   By: Elberta Fortis M.D.   On: 05/26/2015 21:02   Dg Abd Portable 1v  06/16/2015   CLINICAL DATA:  Status post nasogastric catheter placement  EXAM: PORTABLE ABDOMEN - 1 VIEW  COMPARISON:  None.  FINDINGS: Scattered large and small bowel gas is noted. A nasogastric catheter is noted deep within the stomach.  IMPRESSION: Nasogastric catheter within the stomach.   Electronically Signed   By: Alcide Clever M.D.   On: 06/16/2015 09:28    PHYSICAL  EXAM CVP 18 General: NAD, seated in chair. Neck: Thick, JVP difficult but appears elevated, no thyromegaly or thyroid nodule noted Lungs: CTA CV: Nondisplaced PMI.  Heart regular S1/S2, no S3/S4, no murmur. 1+ edema to knees.   Abdomen: Soft, nontender, no hepatosplenomegaly, mild distention.  Neurologic: Alert and oriented x 3.  Psych: awake, alert, oriented. Normal affect Extremities: No clubbing or cyanosis.   TELEMETRY:  NSR 70s with BiV pacing.  ASSESSMENT AND PLAN: Admitted with elevated weight, nausea/vomiting, and pulmonary edema on CXR.  Volume has been very difficult to control at home despite large diuretic doses at home.  Nephrology consulted. Started on CVVHD.   1. Acute on chronic systolic CHF: Nonischemic cardiomyopathy, s/p [employer] CRT-D. EF 30-35% (03/2015 - on milrinone). Has been on home milrinone 0.375 mcg/kg/min. NYHA class IIIb symptoms chronically. Weight down 25 pounds overall, but up 2 lbs since stopping CVVH.  CVP 18 this morning.  Co-ox adequate at 69%.  - Off CVVH as of 06/21/15. Continue dobutamine 3 mcg/kg/min (off milrinone with renal failure).  Long-term IHD will be difficult.  - She is now on Lasix 160 mg every 6 hrs with poor UOP.   - Poor LVAD candidate give lack of social support and size/weight, turned down at this center. Referred her to [facility] for 2nd opinion but had not yet gone.  Unfortunately, options are running low at this point.  2. Cardiogenic shock: Transitioned to dobutamine with renal failure as above. Co-ox adequate on dobutamine. 3. VT/VF, recurrent: On amiodarone 200 mg bid.  - Ranolazine stopped in setting of severe renal failure 4. H/O GI bleed with AVM: Stable, no evidence for bleeding.  5. Pulmonary HTN: RHC 05/01/15 PVR 4.0, improved. Continue current dose of sildenafil.  6. AKI on CKD:  Off CVVH.  Will see how she does on high dose Lasix.  Difficult IHD candidate. Renal following.  She is beginning to understand that if  her kidneys do not improve, there is little left for [place] to do for her, but will likely require several more conversations.  Will get palliative care to see to talk about goals of care in the case that her kidney function continues to worsen. Pt aware and agrees.  Barbara Needle  Alejandro Mulling, PA-C 7:16 AM   Advanced Heart Failure Team Pager [phone] (M-F; 7a - 4p)  Please contact [facility] for night-coverage after hours (4p -7a ) and weekends on [email or web address]  Patient seen with PA, agree with the above note.   CVP remains high at 18, co-ox adequate at 69% on dobutamine.  I had a conversation with her this morning about poor prognosis unless renal function improves.  Right now, it does not appear that kidneys are going to recover.  Poor UOP on high dose po Lasix.  With low output CHF on inotrope, intermittent HD would be very difficult.  She understands this.  Will continue to follow for renal recovery but not optimistic.  Palliative care to see.  [name] 06/22/2015 8:45 AM

## 2015-06-22 NOTE — Progress Notes (Signed)
Chaplain was paged for support for Pt who just received bad news. She was told she may not make it out of the hospital. Chaplain prayed with her and told her someone would come by and spend more time with her.    06/22/15 0800  Clinical Encounter Type  Visited With Patient  Visit Type Spiritual support  Referral From Nurse  Spiritual Encounters  Spiritual Needs Prayer;Emotional  Stress Factors  Patient Stress Factors Major life changes

## 2015-06-22 NOTE — Progress Notes (Signed)
Patient ID: ALEZA ABRAMCZYK, female   DOB: 04-06-1966, 49 y.o.   MRN: [id number] S:no new complaints, had been tearful earlier but feels ok now. O:BP 87/62 mmHg  Pulse 78  Temp(Src) 98.6 F (37 C) (Oral)  Resp 16  Ht 5\' 5"  (1.651 m)  Wt 116.1 kg (255 lb 15.3 oz)  BMI 42.59 kg/m2  SpO2 91%  LMP 01/19/2014  Intake/Output Summary (Last 24 hours) at 06/22/15 0842 Last data filed at 06/22/15 0700  Gross per 24 hour  Intake  714.6 ml  Output   1085 ml  Net -370.4 ml   Intake/Output: I/O last 3 completed shifts: In: 1278.1 [P.O.:920; I.V.:358.1] Out: 2493 [Urine:385; Other:2108]  Intake/Output this shift:    Weight change: 1.3 kg (2 lb 13.9 oz) Gen:WD obese AAF in NAD CVS:no rub, faint HS Resp:cta EXB:MWUXLK Ext:1+ pretibial edema   Recent Labs Lab 06/17/15 0450  06/19/15 0350 06/19/15 1550 06/20/15 0405 06/20/15 1545 06/21/15 0420 06/21/15 1625 06/22/15 0430  NA 131*  < > 134* 132* 130* 132* 135 130* 128*  K 3.9  < > 3.7 3.6 3.8 3.8 4.0 3.9 4.6  CL 90*  < > 98* 96* 93* 96* 99* 96* 94*  CO2 29  < > 28 28 28 26 28 27 27   GLUCOSE 107*  < > 152* 194* 188* 174* 98 152* 114*  BUN 51*  < > 17 15 14 12 11 13 19   CREATININE 1.90*  < > 1.19* 1.28* 1.28* 1.29* 1.07* 1.40* 2.01*  ALBUMIN 3.0*  < > 2.7* 2.7* 2.5* 2.7* 2.7* 2.8* 2.9*  CALCIUM 8.5*  < > 8.1* 8.0* 8.1* 8.1* 8.5* 8.1* 8.4*  PHOS 4.1  < > 2.3* 2.5 1.9* 1.9* 1.7* 1.9* 2.7  AST 188*  --   --   --   --   --  81*  --   --   ALT 96*  --   --   --   --   --  89*  --   --   < > = values in this interval not displayed. Liver Function Tests:  Recent Labs Lab 06/17/15 0450  06/21/15 0420 06/21/15 1625 06/22/15 0430  AST 188*  --  81*  --   --   ALT 96*  --  89*  --   --   ALKPHOS 127*  --  119  --   --   BILITOT 2.6*  --  1.4*  --   --   PROT 6.9  --  7.4  --   --   ALBUMIN 3.0*  < > 2.7* 2.8* 2.9*  < > = values in this interval not displayed. No results for input(s): LIPASE, AMYLASE in the last 168  hours.  Recent Labs Lab 06/17/15 0807  AMMONIA 32   CBC:  Recent Labs Lab 06/16/15 0513 06/17/15 0450 06/18/15 0400 06/18/15 1530 06/20/15 0405 06/21/15 0420  WBC 5.2 6.3 8.4  --  5.6 4.6  HGB 11.8* 11.5* 11.8* 17.3* 11.6* 11.7*  HCT 38.1 36.7 37.2 51.0* 36.7 36.8  MCV 96.0 95.6 96.1  --  94.3 93.2  PLT 280 282 275  --  241 269   Cardiac Enzymes: No results for input(s): CKTOTAL, CKMB, CKMBINDEX, TROPONINI in the last 168 hours. CBG:  Recent Labs Lab 06/21/15 1129 06/21/15 1609 06/21/15 2017 06/21/15 2351 06/22/15 0441  GLUCAP 94 118* 102* 101* 113*    Iron Studies: No results for input(s): IRON, TIBC, TRANSFERRIN, FERRITIN in the last  72 hours. Studies/Results: No results found. Marland Kitchen allopurinol  300 mg Oral Daily  . amiodarone  200 mg Oral BID  . aspirin EC  81 mg Oral Daily  . furosemide  160 mg Oral Q6H  . gabapentin  600 mg Oral BID  . heparin subcutaneous  5,000 Units Subcutaneous 3 times per day  . insulin aspart  0-15 Units Subcutaneous TID WC  . magnesium oxide  400 mg Oral BID  . pantoprazole  40 mg Oral Daily  . sildenafil  40 mg Oral TID  . sodium chloride  10-40 mL Intracatheter Q12H    BMET    Component Value Date/Time   NA 128* 06/22/2015 0430   K 4.6 06/22/2015 0430   CL 94* 06/22/2015 0430   CO2 27 06/22/2015 0430   GLUCOSE 114* 06/22/2015 0430   BUN 19 06/22/2015 0430   CREATININE 2.01* 06/22/2015 0430   CREATININE 0.83 02/06/2012 1520   CALCIUM 8.4* 06/22/2015 0430   GFRNONAA 28* 06/22/2015 0430   GFRAA 32* 06/22/2015 0430   CBC    Component Value Date/Time   WBC 4.6 06/21/2015 0420   RBC 3.95 06/21/2015 0420   RBC 4.48 04/26/2011 1213   HGB 11.7* 06/21/2015 0420   HCT 36.8 06/21/2015 0420   PLT 269 06/21/2015 0420   MCV 93.2 06/21/2015 0420   MCH 29.6 06/21/2015 0420   MCHC 31.8 06/21/2015 0420   RDW 16.8* 06/21/2015 0420   LYMPHSABS 0.9 06/09/2015 0845   MONOABS 0.4 06/09/2015 0845   EOSABS 0.1 06/09/2015 0845    BASOSABS 0.0 06/09/2015 0845     Assessment/Plan:  1. AKI/CKD- related to cardiorenal syndrome/cardiogenic shock- Has been non-oliguric with high dose lasix however UOP decreasing. CVVHD stopped 06/21/15 with plan to transition to IHD if her renal function does not improve, however pt poor candidate for longterm hemodialysis. Agree with palliative care involvement to help set goals/limits of care. She is making urine (only 385cc yesterday) but unclear if she will start to recover renal function. 1. Hold off on dialysis until she is seen by Palliative care team. 2. Acute on chronic systolic CHF due to NICMP (EF 16-10%) had been on home milrinone and large doses of diuretics. Ultrafiltered 27 lbs since admission but still with evidence of volume overload. Continue dobutamine per CHF team. Responding some to IV Lasix but with progressively less UOP.  3. Cardiogenic shock- transition to dobutamine per Cards.  She remains hypotensive and pressor support. 4. Recurrent VT/VF- on ranolazine and amiodarone. Plan per cards 5. Pulmonary htn-on sildenafil 6. Morbid obesity 7. Disposition- poor overall prognosis with the development of renal failure. Agree with palliative care consult  Talena Neira A

## 2015-06-23 ENCOUNTER — Encounter: Payer: Self-pay | Admitting: *Deleted

## 2015-06-23 DIAGNOSIS — R531 Weakness: Secondary | ICD-10-CM

## 2015-06-23 DIAGNOSIS — Z515 Encounter for palliative care: Secondary | ICD-10-CM

## 2015-06-23 LAB — RENAL FUNCTION PANEL
ANION GAP: 8 (ref 5–15)
Albumin: 2.7 g/dL — ABNORMAL LOW (ref 3.5–5.0)
BUN: 32 mg/dL — ABNORMAL HIGH (ref 6–20)
CHLORIDE: 92 mmol/L — AB (ref 101–111)
CO2: 24 mmol/L (ref 22–32)
Calcium: 8.1 mg/dL — ABNORMAL LOW (ref 8.9–10.3)
Creatinine, Ser: 2.8 mg/dL — ABNORMAL HIGH (ref 0.44–1.00)
GFR, EST AFRICAN AMERICAN: 22 mL/min — AB (ref >60)
GFR, EST NON AFRICAN AMERICAN: 19 mL/min — AB (ref >60)
Glucose, Bld: 125 mg/dL — ABNORMAL HIGH (ref 65–99)
POTASSIUM: 4.8 mmol/L (ref 3.5–5.1)
Phosphorus: 4 mg/dL (ref 2.5–4.6)
Sodium: 124 mmol/L — ABNORMAL LOW (ref 135–145)

## 2015-06-23 LAB — GLUCOSE, CAPILLARY
GLUCOSE-CAPILLARY: 147 mg/dL — AB (ref 65–99)
GLUCOSE-CAPILLARY: 114 mg/dL — AB (ref 65–99)
Glucose-Capillary: 90 mg/dL (ref 65–99)
Glucose-Capillary: 103 mg/dL — ABNORMAL HIGH (ref 65–99)
Glucose-Capillary: 115 mg/dL — ABNORMAL HIGH (ref 65–99)
Glucose-Capillary: 124 mg/dL — ABNORMAL HIGH (ref 65–99)

## 2015-06-23 LAB — CARBOXYHEMOGLOBIN
CARBOXYHEMOGLOBIN: 1.8 % — AB (ref 0.5–1.5)
Methemoglobin: 0.7 % (ref 0.0–1.5)
O2 SAT: 57.3 %
TOTAL HEMOGLOBIN: 12 g/dL (ref 12.0–16.0)

## 2015-06-23 MED ORDER — METOLAZONE 5 MG PO TABS
5.0000 mg | ORAL_TABLET | Freq: Two times a day (BID) | ORAL | Status: DC
  Administered 2015-06-23 – 2015-06-24 (×3): 5 mg via ORAL
  Filled 2015-06-23 (×3): qty 1

## 2015-06-23 MED ORDER — ENOXAPARIN SODIUM 40 MG/0.4ML ~~LOC~~ SOLN
40.0000 mg | SUBCUTANEOUS | Status: DC
  Administered 2015-06-23 – 2015-06-24 (×2): 40 mg via SUBCUTANEOUS
  Filled 2015-06-23 (×2): qty 0.4

## 2015-06-23 NOTE — Patient Outreach (Signed)
[facility]) Care Management  06/23/2015  [name] [birth year] [id number]   I regret to learn about [name]'s decline. I understand that she has chosen to pursue residential hospice placement. Given this choice, [name] will have full case management services through Hospice and we will close her case for community case management.   It has been our pleasure at [facility] Care Management to assist with [name]'s care.    Marja Kays MHA,BSN,RN,CCM [facility] Care Management  [phone]

## 2015-06-23 NOTE — Progress Notes (Signed)
Patient ID: [name], female   DOB: 1966-06-12, 49 y.o.   MRN: [id number] S:pt without new complaints O:BP 86/66 mmHg  Pulse 74  Temp(Src) 98.4 F (36.9 C) (Oral)  Resp 18  Ht 5\' 5"  (1.651 m)  Wt 117.6 kg (259 lb 4.2 oz)  BMI 43.14 kg/m2  SpO2 93%  LMP 01/19/2014  Intake/Output Summary (Last 24 hours) at 06/23/15 3875 Last data filed at 06/23/15 0800  Gross per 24 hour  Intake    359 ml  Output    380 ml  Net    -21 ml   Intake/Output: I/O last 3 completed shifts: In: 846.6 [P.O.:510; I.V.:336.6] Out: 485 [Urine:485]  Intake/Output this shift:  Total I/O In: 5 [I.V.:5] Out: -  Weight change: 1.5 kg (3 lb 4.9 oz) Gen:WD obese AAF with slowed mentation CVS: no rub Resp:cta IEP:PIRJJO Ext:+1 edema   Recent Labs Lab 06/17/15 0450  06/19/15 1550 06/20/15 0405 06/20/15 1545 06/21/15 0420 06/21/15 1625 06/22/15 0430 06/23/15 0443  NA 131*  < > 132* 130* 132* 135 130* 128* 124*  K 3.9  < > 3.6 3.8 3.8 4.0 3.9 4.6 4.8  CL 90*  < > 96* 93* 96* 99* 96* 94* 92*  CO2 29  < > 28 28 26 28 27 27 24   GLUCOSE 107*  < > 194* 188* 174* 98 152* 114* 125*  BUN 51*  < > 15 14 12 11 13 19  32*  CREATININE 1.90*  < > 1.28* 1.28* 1.29* 1.07* 1.40* 2.01* 2.80*  ALBUMIN 3.0*  < > 2.7* 2.5* 2.7* 2.7* 2.8* 2.9* 2.7*  CALCIUM 8.5*  < > 8.0* 8.1* 8.1* 8.5* 8.1* 8.4* 8.1*  PHOS 4.1  < > 2.5 1.9* 1.9* 1.7* 1.9* 2.7 4.0  AST 188*  --   --   --   --  81*  --   --   --   ALT 96*  --   --   --   --  89*  --   --   --   < > = values in this interval not displayed. Liver Function Tests:  Recent Labs Lab 06/17/15 0450  06/21/15 0420 06/21/15 1625 06/22/15 0430 06/23/15 0443  AST 188*  --  81*  --   --   --   ALT 96*  --  89*  --   --   --   ALKPHOS 127*  --  119  --   --   --   BILITOT 2.6*  --  1.4*  --   --   --   PROT 6.9  --  7.4  --   --   --   ALBUMIN 3.0*  < > 2.7* 2.8* 2.9* 2.7*  < > = values in this interval not displayed. No results for input(s): LIPASE, AMYLASE in the  last 168 hours.  Recent Labs Lab 06/17/15 0807  AMMONIA 32   CBC:  Recent Labs Lab 06/17/15 0450 06/18/15 0400 06/18/15 1530 06/20/15 0405 06/21/15 0420  WBC 6.3 8.4  --  5.6 4.6  HGB 11.5* 11.8* 17.3* 11.6* 11.7*  HCT 36.7 37.2 51.0* 36.7 36.8  MCV 95.6 96.1  --  94.3 93.2  PLT 282 275  --  241 269   Cardiac Enzymes: No results for input(s): CKTOTAL, CKMB, CKMBINDEX, TROPONINI in the last 168 hours. CBG:  Recent Labs Lab 06/22/15 0441 06/22/15 0739 06/22/15 1219 06/22/15 1637 06/23/15 0302  GLUCAP 113* 93 96 103* 103*  Iron Studies: No results for input(s): IRON, TIBC, TRANSFERRIN, FERRITIN in the last 72 hours. Studies/Results: No results found. Marland Kitchen allopurinol  300 mg Oral Daily  . ALPRAZolam  0.5 mg Oral TID  . amiodarone  200 mg Oral BID  . aspirin EC  81 mg Oral Daily  . enoxaparin (LOVENOX) injection  40 mg Subcutaneous Q24H  . furosemide  160 mg Oral Q6H  . gabapentin  600 mg Oral BID  . insulin aspart  0-15 Units Subcutaneous TID WC  . magnesium oxide  400 mg Oral BID  . metolazone  5 mg Oral BID  . pantoprazole  40 mg Oral Daily  . sildenafil  40 mg Oral TID  . sodium chloride  10-40 mL Intracatheter Q12H    BMET    Component Value Date/Time   NA 124* 06/23/2015 0443   K 4.8 06/23/2015 0443   CL 92* 06/23/2015 0443   CO2 24 06/23/2015 0443   GLUCOSE 125* 06/23/2015 0443   BUN 32* 06/23/2015 0443   CREATININE 2.80* 06/23/2015 0443   CREATININE 0.83 02/06/2012 1520   CALCIUM 8.1* 06/23/2015 0443   GFRNONAA 19* 06/23/2015 0443   GFRAA 22* 06/23/2015 0443   CBC    Component Value Date/Time   WBC 4.6 06/21/2015 0420   RBC 3.95 06/21/2015 0420   RBC 4.48 04/26/2011 1213   HGB 11.7* 06/21/2015 0420   HCT 36.8 06/21/2015 0420   PLT 269 06/21/2015 0420   MCV 93.2 06/21/2015 0420   MCH 29.6 06/21/2015 0420   MCHC 31.8 06/21/2015 0420   RDW 16.8* 06/21/2015 0420   LYMPHSABS 0.9 06/09/2015 0845   MONOABS 0.4 06/09/2015 0845   EOSABS  0.1 06/09/2015 0845   BASOSABS 0.0 06/09/2015 0845     Assessment/Plan:  1. AKI/CKD- related to cardiorenal syndrome/cardiogenic shock- Has been non-oliguric with high dose lasix however UOP decreasing. CVVHD stopped 06/21/15 with plan to transition to IHD if her renal function does not improve, however pt poor candidate for longterm hemodialysis. Agree with palliative care involvement to help set goals/limits of care. She is making urine (only 485cc yesterday) but unclear if she will start to recover renal function. 1. Hold off on dialysis until she is seen by Palliative care team again today.   2. In light of increasing dependence on dobutamine despite volume control, ongoing dialysis would not be helpful and likely detrimental with persistent hypotension. 3. Recommend transition to comfort care 2. Acute on chronic systolic CHF due to NICMP (EF 16-10%) had been on home milrinone and large doses of diuretics. Ultrafiltered 27 lbs since admission but still with evidence of volume overload.  1. Continue dobutamine per CHF team.  2. Not responding to IV Lasix and metolazone added.   3. Cardiogenic shock- transition to dobutamine per Cards. She remains hypotensive and pressor support. 4. Recurrent VT/VF- on ranolazine and amiodarone. Plan per cards 5. Pulmonary htn-on sildenafil 6. Morbid obesity 7. Disposition- poor overall prognosis with the development of renal failure. Agree with palliative care consult  Jourdin Connors A

## 2015-06-23 NOTE — Progress Notes (Signed)
Patient ID: [name], female   DOB: [birth year], 49 y.o.   MRN: [id number]   SUBJECTIVE: Patient was admitted with nausea, vomiting, and weight gain. Pulmonary edema on CXR.    CVVHD stopped 06/21/15. Ultrafiltered 27 pounds overall.   Seen by palliative 06/22/15. Wishes to remain full code at this time.   Very tearful and slow spoken this morning. Understand prognosis much better.  To see palliative again today with daughter and parents. Family is aware of prognosis.  Feels OK symptomatically this morning, just very emotional.  Remains on Dobutamine @ 3. Cr quickly worsened after stopping CVVHD. 1.4 -> 2.01 ->2.80, Co-ox 57.3%. CVP 24 Only 380 cc of urine out total yesterday.   Scheduled Meds: . allopurinol  300 mg Oral Daily  . ALPRAZolam  0.5 mg Oral TID  . amiodarone  200 mg Oral BID  . aspirin EC  81 mg Oral Daily  . enoxaparin (LOVENOX) injection  40 mg Subcutaneous Q24H  . furosemide  160 mg Oral Q6H  . gabapentin  600 mg Oral BID  . insulin aspart  0-15 Units Subcutaneous TID WC  . magnesium oxide  400 mg Oral BID  . pantoprazole  40 mg Oral Daily  . sildenafil  40 mg Oral TID  . sodium chloride  10-40 mL Intracatheter Q12H   Continuous Infusions: . sodium chloride    . DOBUTamine 3 mcg/kg/min (06/22/15 1900)   PRN Meds:.albuterol, cyclobenzaprine, ondansetron (ZOFRAN) IV, oxyCODONE-acetaminophen **AND** oxyCODONE, phenol, sodium chloride, white petrolatum    Filed Vitals:   06/23/15 0400 06/23/15 0500 06/23/15 0600 06/23/15 0700  BP: 88/62 89/65 81/55  95/63  Pulse:      Temp: 98.5 F (36.9 C)     TempSrc: Oral     Resp: 21 16 15 19   Height:      Weight:   259 lb 4.2 oz (117.6 kg)   SpO2: 95% 91% 93% 96%    Intake/Output Summary (Last 24 hours) at 06/23/15 0707 Last data filed at 06/23/15 0700  Gross per 24 hour  Intake  604.2 ml  Output    380 ml  Net  224.2 ml    LABS: Basic Metabolic Panel:  Recent Labs  40/98/11 0420  06/22/15 0430  06/23/15 0443  NA 135  < > 128* 124*  K 4.0  < > 4.6 4.8  CL 99*  < > 94* 92*  CO2 28  < > 27 24  GLUCOSE 98  < > 114* 125*  BUN 11  < > 19 32*  CREATININE 1.07*  < > 2.01* 2.80*  CALCIUM 8.5*  < > 8.4* 8.1*  MG 2.4  --  2.5*  --   PHOS 1.7*  < > 2.7 4.0  < > = values in this interval not displayed. Liver Function Tests:  Recent Labs  06/21/15 0420  06/22/15 0430 06/23/15 0443  AST 81*  --   --   --   ALT 89*  --   --   --   ALKPHOS 119  --   --   --   BILITOT 1.4*  --   --   --   PROT 7.4  --   --   --   ALBUMIN 2.7*  < > 2.9* 2.7*  < > = values in this interval not displayed. No results for input(s): LIPASE, AMYLASE in the last 72 hours. CBC:  Recent Labs  06/21/15 0420  WBC 4.6  HGB 11.7*  HCT 36.8  MCV 93.2  PLT 269   Cardiac Enzymes: No results for input(s): CKTOTAL, CKMB, CKMBINDEX, TROPONINI in the last 72 hours. BNP: Invalid input(s): POCBNP D-Dimer: No results for input(s): DDIMER in the last 72 hours. Hemoglobin A1C: No results for input(s): HGBA1C in the last 72 hours. Fasting Lipid Panel: No results for input(s): CHOL, HDL, LDLCALC, TRIG, CHOLHDL, LDLDIRECT in the last 72 hours. Thyroid Function Tests: No results for input(s): TSH, T4TOTAL, T3FREE, THYROIDAB in the last 72 hours.  Invalid input(s): FREET3 Anemia Panel: No results for input(s): VITAMINB12, FOLATE, FERRITIN, TIBC, IRON, RETICCTPCT in the last 72 hours.  RADIOLOGY: Ct Abdomen Pelvis Wo Contrast  06/19/2015  CLINICAL DATA:  Uncontrolled vomiting for 5 days EXAM: CT ABDOMEN AND PELVIS WITHOUT CONTRAST TECHNIQUE: Multidetector CT imaging of the abdomen and pelvis was performed following the standard protocol without IV contrast. COMPARISON:  01/20/2015 FINDINGS: Subsegmental atelectasis at the left lung base. Severe cardiomegaly. AICD device. Postcholecystectomy The left lobe of the liver is prominent but there is no evidence of nodularity of the contour of the liver. Spleen, pancreas,  adrenal glands, and kidneys are within normal limits. There is significant stranding throughout the subcutaneous fat across the abdomen. Bladder and uterus are within normal limits. There is a 1.9 cm hyperdense area within the left adnexa of unknown significance. It may represent an ovarian complex cyst. There is a small amount of free fluid layering in the pelvis. There is no obvious retroperitoneal adenopathy. IMPRESSION: Small amount of free fluid in the pelvis is nonspecific. 1.9 cm hyperdense abnormality in the left adnexa may represent a lesion in the ovary. Pelvic ultrasound is warranted. There is edema within the subcutaneous fat across the abdomen of unknown significance. Left basilar subsegmental atelectasis. Electronically Signed   By: Jolaine Click M.D.   On: 06/29/2015 19:02   Dg Chest Portable 1 View  06/16/2015  CLINICAL DATA:  49 year old female central line placement. Initial encounter. EXAM: PORTABLE CHEST 1 VIEW COMPARISON:  06/16/2015 and earlier. FINDINGS: Portable AP semi upright view at 1154 hours. Continued low lung volumes. Pulmonary vascular congestion has regressed. Stable left chest cardiac AICD. Stable cardiac size and mediastinal contours. Right PICC line remains in place, tip projects at the level of the medial right subclavian vein. An enteric tube is now in place in courses to the epigastrium, tip not included. There is a new left IJ approach dual lumen dialysis type catheter. Catheter tips project at the cavoatrial junction level. No pneumothorax. Continued retrocardiac opacity. IMPRESSION: 1. Left IJ approach dual lumen dialysis type catheter placed with no adverse features. 2. Enteric tube placed, courses to the abdomen tip not included. 3. Stable right PICC line. 4. Interval regressed pulmonary edema. Electronically Signed   By: Odessa Fleming M.D.   On: 06/16/2015 12:04   Dg Chest Portable 1 View  06/24/2015  CLINICAL DATA:  Bradycardia, CHF, chronic milrinone infusion, nausea and  vomiting EXAM: PORTABLE CHEST 1 VIEW COMPARISON:  05/26/2015 FINDINGS: Right PICC line tip remains at the right innominate vein as before. Left subclavian multilead defibrillator/pacer evident. Heart is enlarged with vascular and interstitial prominence suggesting mild edema. Limited exam because of body habitus. Increased left base density suggest chronic atelectasis when compared to the prior CT. IMPRESSION: Cardiomegaly with increased vascular and interstitial prominence suggesting mild developing edema pattern compare to 05/26/2015. Electronically Signed   By: Judie Petit.  Shick M.D.   On: 06/13/2015 17:25   Dg Chest Port 1 View  05/26/2015  CLINICAL DATA:  Shortness of breath and syncope 1 day. EXAM: PORTABLE CHEST - 1 VIEW COMPARISON:  05/16/2015 FINDINGS: Right-sided PICC line has been pulled back slightly with tip overlying the region of the SVC near the junction of the subclavian vein. Left-sided pacemaker unchanged. Lungs are hypoinflated with mild prominence of the perihilar markings suggesting mild vascular congestion. There is mild stable cardiomegaly. Remainder of the exam is unchanged. IMPRESSION: Stable mild cardiomegaly with evidence of mild vascular congestion. Right-sided PICC line has been pulled back slightly with tip over the region of the SVC near the junction with the subclavian vein. Electronically Signed   By: Elberta Fortis M.D.   On: 05/26/2015 21:02   Dg Abd Portable 1v  06/16/2015  CLINICAL DATA:  Status post nasogastric catheter placement EXAM: PORTABLE ABDOMEN - 1 VIEW COMPARISON:  None. FINDINGS: Scattered large and small bowel gas is noted. A nasogastric catheter is noted deep within the stomach. IMPRESSION: Nasogastric catheter within the stomach. Electronically Signed   By: Alcide Clever M.D.   On: 06/16/2015 09:28    PHYSICAL EXAM CVP 24 General: NAD, seated in chair with feet propped up. Neck: Thick, JVP difficult but appears elevated, no thyromegaly or nodule  appreciated Lungs: Clear, no resp distress, normal effort. CV: Nondisplaced PMI.  Heart regular S1/S2, no S3/S4, no murmur noted. 2+ edema to knees.   Abdomen: Obese, soft, NT, no HSM, mild distention.  Neurologic: Alert and oriented x 3.  Psych: awake, alert, oriented. Flat affect Extremities: No clubbing or cyanosis.   TELEMETRY:  NSR 70s with BiV pacing.  ASSESSMENT AND PLAN: Admitted with elevated weight, nausea/vomiting, and pulmonary edema on CXR.  Volume has been very difficult to control at home despite large diuretic doses at home.  Nephrology consulted. Started on CVVHD.   1. Acute on chronic systolic CHF: Nonischemic cardiomyopathy, s/p [employer] CRT-D. EF 30-35% (03/2015 - on milrinone). Has been on home milrinone 0.375 mcg/kg/min. NYHA class IIIb symptoms chronically.  - Weight had improved with CVVH, but up 7 lbs since stopping (4 lbs over night). - Co-ox marginal at 57%. CVP 24 this morning.  Will increase dobutamine to 4.   - Off CVVH as of 06/21/15.  She is not a candidate for long-term IHD - Remains on Lasix 160 mg every 6 hrs with minimal UOP, add metolazone.   - Poor LVAD candidate give lack of social support and size/weight, turned down at this center. Referred her to [facility] for 2nd opinion but had not yet gone.  Unfortunately, options are running low at this point.  2. Cardiogenic shock: Transitioned to dobutamine with renal failure as above. Increase dobutamine to 4 with co-ox 57%.  3. VT/VF, recurrent: On amiodarone 200 mg bid.  - Ranolazine stopped in setting of severe renal failure 4. H/O GI bleed with AVM: Stable, no evidence for bleeding.  5. Pulmonary HTN: RHC 05/01/15 PVR 4.0. Continue current dose of sildenafil.  6. AKI on CKD:  Off CVVH.  Difficult IHD candidate. Renal following. - Creatinine continues to worsen with very poor UO on high dose po lasix.  Seen by palliative care. Wants to consider hospice but also wishes to remain full code at this  time.  With steeply worsening creatinine prognosis continues to worsen.  To see palliative again with family today.  [name], PA-C 7:07 AM   Advanced Heart Failure Team Pager [phone] (M-F; 7a - 4p)  Please contact [facility] for night-coverage after hours (4p -7a ) and weekends on  [email or web address]  Patient seen with PA, agree with the above note.  Very poor prognosis.  Seen by palliative care, wants to remain full code for now.  In my conversation with her, sounds like she wants to see daughter and arrange for her care.  I think she understands the severity of the situation.  I do not think that there has been appreciable renal recovery.  Creatinine rising with poor UOP despite high dose diuretics.  Weight and CVP going up.   - Will add metolazone 5 mg bid today to regimen but not optimistic that this will help much.  - Increase dobutamine to 5 mcg/kg/min - Poor IHD candidate, SBP continues to run in 80s on inotrope.  Renal following.   Will need ongoing conversations with medical team and palliative care service, palliative care to return this morning.   [name] 06/23/2015 8:21 AM

## 2015-06-23 NOTE — Progress Notes (Addendum)
4:30pm CSW called [facility] to check on the referral- there admissions coordinator was out this afternoon but will follow up on referral first thing tomorrow morning.  CSW informed RN who will update patient  1:10pm CSW received consult for residential hospice placement- CSW faxed referral to pt top choice of [name]  CSW had also spoken to [facility] earlier in the day to assess if they would be able to help with patient guardianship concerns.  Facility Child psychotherapist stated that they do help with some end of life issues like this and have a attorney who is on their board who they sometimes utilize for legal documentation- social worker felt as if they would be able to work with the patient to accomplish her goal of securing legal paperwork expressing her wishes for her daughter  CSW will continue to follow  Merlyn Lot, [facility] Clinical Social Worker [phone]

## 2015-06-23 NOTE — Progress Notes (Signed)
Daily Progress Note   Patient Name: [name]       Date: 06/23/2015 DOB: [birth year]  Age: 49 y.o. MRN#: [id number] Attending Physician: Laurey Morale, MD Primary Care Physician: [name], MD Admit Date: 06/24/2015  Reason for Consultation/Follow-up: Establishing goals of care, Inpatient hospice referral, Non pain symptom management, Pain control and Psychosocial/spiritual support  Subjective:     - both parents and 72 yo daughter present, continued conversation regarding diagnosis, prognosis, GOC and options.  All understand the limited prognosis      Questions and concerns addressed.  Discussed natural trajectory and expectations at EOL  -discussed options of home with hospice vs hospice facility.   All are hopeful for hospice facility in [place].  Once discharged no further diagnostics, dc vasopressors.  No life prolonging measurers.   All family members are able to verbalize love and support of the patietn   -much conversation regarding 15yo daughter and "guardianship".  Patient tells me her friend Otelia Santee Scales is committed to caring for [place].  I spoke with SW and they have contacted [facility] and hope is that facility will be able to help family facilitate legal guardianship.  Family is aware that a private lawyer is always an options  -emotional support offered, patient tells me she is "hurting with sadness" at the thought of leaving her daughter  Length of Stay: 9 days  Current Medications: Scheduled Meds:  . allopurinol  300 mg Oral Daily  . ALPRAZolam  0.5 mg Oral TID  . amiodarone  200 mg Oral BID  . aspirin EC  81 mg Oral Daily  . enoxaparin (LOVENOX) injection  40 mg Subcutaneous Q24H  . furosemide  160 mg Oral Q6H  . gabapentin  600 mg Oral BID  . insulin aspart  0-15 Units Subcutaneous TID WC  . magnesium oxide  400 mg Oral BID  . metolazone  5 mg Oral BID  . pantoprazole  40 mg Oral Daily  . sildenafil  40 mg Oral TID  . sodium  chloride  10-40 mL Intracatheter Q12H    Continuous Infusions: . sodium chloride    . DOBUTamine 4 mcg/kg/min (06/23/15 0840)    PRN Meds: albuterol, cyclobenzaprine, ondansetron (ZOFRAN) IV, oxyCODONE-acetaminophen **AND** oxyCODONE, phenol, sodium chloride, white petrolatum  Palliative Performance Scale:  30 %     Vital Signs: BP 108/76 mmHg  Pulse 74  Temp(Src) 98.4 F (36.9 C) (Oral)  Resp 16  Ht 5\' 5"  (1.651 m)  Wt 117.6 kg (259 lb 4.2 oz)  BMI 43.14 kg/m2  SpO2 97%  LMP 01/19/2014 SpO2: SpO2: 97 % O2 Device: O2 Device: Not Delivered O2 Flow Rate: O2 Flow Rate (L/min): 2 L/min  Intake/output summary:  Intake/Output Summary (Last 24 hours) at 06/23/15 1015 Last data filed at 06/23/15 0800  Gross per 24 hour  Intake  338.6 ml  Output    255 ml  Net   83.6 ml   LBM: Last BM Date: 06/19/15 Baseline Weight: Weight: 123.832 kg (273 lb) Most recent weight: Weight: 117.6 kg (259 lb 4.2 oz)  Physical Exam:              General: ill appearing, OOB to chair, letahrgic HEENT: moist buccal membranes, no exudate CVS: RRR Resp: CTA YME:BRAXE, [place] +BS Skin: warm and dry Neuro: oriented X3   Additional Data Reviewed: Recent Labs     06/21/15  0420   06/22/15  0430  06/23/15  0443  WBC  4.6   --    --    --   HGB  11.7*   --    --    --   PLT  269   --    --    --   NA  135   < >  128*  124*  BUN  11   < >  19  32*  CREATININE  1.07*   < >  2.01*  2.80*   < > = values in this interval not displayed.     Problem List:  Patient Active Problem List   Diagnosis Date Noted  . Acute on chronic combined systolic and diastolic congestive heart failure, NYHA class 4 (HCC) 06/22/2015  . Nausea & vomiting 07/06/2015  . Bradycardia 05/12/2015  . Palliative care encounter 05/09/2015  . DNR (do not resuscitate) discussion 05/09/2015  . Atrial fibrillation [I48.91] 04/30/2015  . Acute on chronic systolic heart failure, NYHA class 3 (HCC) 04/16/2015  . Systolic CHF,  acute on chronic (HCC) 04/16/2015  . Pulmonary hypertension (HCC) 04/15/2015  . Elevated d-dimer   . Faintness   . VT (ventricular tachycardia) (HCC)   . Cardiogenic shock (HCC)   . Congestive heart disease (HCC)   . Ventricular fibrillation (HCC)   . Chest pain 03/26/2015  . Syncope 03/26/2015  . Dizziness 01/26/2015  . Fatigue 01/26/2015  . Acute on chronic renal insufficiency (HCC) 01/04/2015  . Torsades de pointes (HCC)   . Non-ischemic cardiomyopathy (HCC)   . Multiple thyroid nodules   . Primary gout   . Paroxysmal ventricular tachycardia (HCC) 08/06/2014  . Hyperglycemia   . Essential hypertension   . Morbid obesity (HCC) 12/23/2013  . OSA on CPAP 12/23/2013  . Implanon removal 12/04/2013  . Degenerative cervical disc 08/21/2013  . Cervical spondylosis without myelopathy 08/21/2013  . Left rotator cuff tear 08/21/2013  . Bursitis of shoulder 08/06/2013  . Hyperglycemia without ketosis 06/09/2013  . 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 Peripheral neuropathy (HCC) 04/26/2011    Class: Chronic  . Mitral regurgitation 03/01/2011  . Constipation 05/04/2010  . RECTAL BLEEDING 05/04/2010  . ICD- BS 2007 03/01/2010  . GERD (gastroesophageal reflux disease) 10/12/2009  . Chronic systolic CHF 09/15/2009  . Iron deficiency anemia 07/19/2009     Palliative Care Assessment & Plan    Code Status:  Limited until discharge, once discharged DNR/DNI and discontinue all vasopressors    Goals of Care:  Comfort, quality and dignity--no further life prolonging meaures  Desire for further Chaplaincy support:yes    Prognosis: < 2 weeks   Discharge Planning: Hospice facility, will write for choice--interested in [facility] plan was discussed with Dr [name] and Dr Lewis Moccasin  Thank you for allowing the Palliative Medicine Team to assist in the care of this patient.   Time In: 1130 Time Out: 1330 Total Time 90 min Prolonged Time Billed  yes       Greater than 50%  of this time was spent counseling and coordinating care related to the above assessment and plan.     Canary Brim, NP  06/23/2015, 10:15 AM  Please contact Palliative Medicine Team phone at [phone] for questions and concerns.

## 2015-06-23 NOTE — Progress Notes (Signed)
   06/23/15 1500  Clinical Encounter Type  Visited With Patient  Visit Type Initial  Referral From Nurse  Consult/Referral To Chaplain  Spiritual Encounters  Spiritual Needs Prayer;Emotional  Stress Factors  Patient Stress Factors Major life changes  Chaplain visited with Pt; Pt was resting; Chaplain desires to come back another time

## 2015-06-24 ENCOUNTER — Encounter: Payer: Self-pay | Admitting: Licensed Clinical Social Worker

## 2015-06-24 DIAGNOSIS — Z66 Do not resuscitate: Secondary | ICD-10-CM

## 2015-06-24 LAB — RENAL FUNCTION PANEL
ALBUMIN: 2.8 g/dL — AB (ref 3.5–5.0)
Anion gap: 10 (ref 5–15)
BUN: 50 mg/dL — AB (ref 6–20)
CALCIUM: 8.4 mg/dL — AB (ref 8.9–10.3)
CHLORIDE: 90 mmol/L — AB (ref 101–111)
CO2: 24 mmol/L (ref 22–32)
CREATININE: 3.57 mg/dL — AB (ref 0.44–1.00)
GFR calc Af Amer: 16 mL/min — ABNORMAL LOW (ref >60)
GFR, EST NON AFRICAN AMERICAN: 14 mL/min — AB (ref >60)
Glucose, Bld: 125 mg/dL — ABNORMAL HIGH (ref 65–99)
PHOSPHORUS: 5 mg/dL — AB (ref 2.5–4.6)
Potassium: 4.4 mmol/L (ref 3.5–5.1)
SODIUM: 124 mmol/L — AB (ref 135–145)

## 2015-06-24 LAB — GLUCOSE, CAPILLARY
GLUCOSE-CAPILLARY: 126 mg/dL — AB (ref 65–99)
GLUCOSE-CAPILLARY: 95 mg/dL (ref 65–99)
Glucose-Capillary: 107 mg/dL — ABNORMAL HIGH (ref 65–99)
Glucose-Capillary: 88 mg/dL (ref 65–99)

## 2015-06-24 LAB — BASIC METABOLIC PANEL
Anion gap: 12 (ref 5–15)
BUN: 50 mg/dL — AB (ref 6–20)
CHLORIDE: 90 mmol/L — AB (ref 101–111)
CO2: 22 mmol/L (ref 22–32)
CREATININE: 3.64 mg/dL — AB (ref 0.44–1.00)
Calcium: 8.4 mg/dL — ABNORMAL LOW (ref 8.9–10.3)
GFR, EST AFRICAN AMERICAN: 16 mL/min — AB (ref >60)
GFR, EST NON AFRICAN AMERICAN: 14 mL/min — AB (ref >60)
Glucose, Bld: 125 mg/dL — ABNORMAL HIGH (ref 65–99)
POTASSIUM: 4.4 mmol/L (ref 3.5–5.1)
SODIUM: 124 mmol/L — AB (ref 135–145)

## 2015-06-24 LAB — CBC
HCT: 34.1 % — ABNORMAL LOW (ref 36.0–46.0)
Hemoglobin: 11.1 g/dL — ABNORMAL LOW (ref 12.0–15.0)
MCH: 29.4 pg (ref 26.0–34.0)
MCHC: 32.6 g/dL (ref 30.0–36.0)
MCV: 90.5 fL (ref 78.0–100.0)
PLATELETS: 243 10*3/uL (ref 150–400)
RBC: 3.77 MIL/uL — AB (ref 3.87–5.11)
RDW: 16.8 % — AB (ref 11.5–15.5)
WBC: 4.9 10*3/uL (ref 4.0–10.5)

## 2015-06-24 LAB — CARBOXYHEMOGLOBIN
Carboxyhemoglobin: 1.9 % — ABNORMAL HIGH (ref 0.5–1.5)
METHEMOGLOBIN: 0.8 % (ref 0.0–1.5)
O2 Saturation: 58.9 %
Total hemoglobin: 12.6 g/dL (ref 12.0–16.0)

## 2015-06-24 MED ORDER — LORAZEPAM 2 MG/ML IJ SOLN
1.0000 mg | INTRAMUSCULAR | Status: DC | PRN
  Administered 2015-06-24 – 2015-06-25 (×4): 1 mg via INTRAVENOUS
  Filled 2015-06-24 (×4): qty 1

## 2015-06-24 MED ORDER — ENOXAPARIN SODIUM 30 MG/0.3ML ~~LOC~~ SOLN: 30.0000 mg | SUBCUTANEOUS | Status: DC

## 2015-06-24 MED ORDER — MIDAZOLAM HCL 2 MG/2ML IJ SOLN: 1.0000 mg | INTRAMUSCULAR | Status: DC | PRN

## 2015-06-24 MED ORDER — MORPHINE SULFATE (PF) 2 MG/ML IV SOLN
1.0000 mg | INTRAVENOUS | Status: DC | PRN
  Filled 2015-06-24: qty 1

## 2015-06-24 MED ORDER — MORPHINE SULFATE (PF) 2 MG/ML IV SOLN
1.0000 mg | INTRAVENOUS | Status: DC | PRN
  Administered 2015-06-24 – 2015-06-25 (×7): 2 mg via INTRAVENOUS
  Filled 2015-06-24 (×7): qty 1

## 2015-06-24 MED ORDER — MORPHINE SULFATE (PF) 2 MG/ML IV SOLN
1.0000 mg | INTRAVENOUS | Status: DC | PRN
  Administered 2015-06-24: 1 mg via INTRAVENOUS

## 2015-06-24 NOTE — Patient Outreach (Signed)
[facility]) Care Management  06/24/2015  [name] 1966/04/16 [id number]   Notification from Marja Kays, RN to close case for [facility] Care Management as patient is now enrolled with [facility].  Thanks, Corrie Mckusick. Sharlee Blew [facility] Care Management [facility] CM Assistant Phone: [phone] Fax: [phone]

## 2015-06-24 NOTE — Progress Notes (Signed)
CSW spoke with [name] at [facility]- they need to confirm that pt will be DNR at time of DC- CSW spoke with palliative Dr who confirmed plan was to make DNR when leaving the hospital  CSW faxed updated palliative notes to facility- they will follow up on referral this morning  CSW gave hand off to unit CSW who will follow [name] for discharge to hospice  Merlyn Lot, [facility] Clinical Social Worker [phone]

## 2015-06-24 NOTE — Progress Notes (Signed)
Update: DC to [facility] delayed today due to patient's deteriorating condition. She is now a DNR and is being slowly weaned from her pressors.  Concerns discussed between Palliative NP, CSW and PA that patient may experience a hospital death and it was decided to monitor her overnight.  CSW spoke with patient's parents- [name] and [name]  [phone]  And [phone] respectively.  They live in [place] and are going to travel to [facility] this afternoon. CSW met with patient's 49 year old daughter [name] who has been staying with her mother most of the time. She is aware of her mother's condition and has been keeping in touch with family members about her mother's condition. A family friend- [name] has been asked by patient to take guardianship for [name]-  [phone]. She lives in [place] and plans to come to [name] tomorrow. [name] states that she respects and trusts Ms. Scales and feels that she will be a good guardian.  Patient's maternal grandmother and other other family members live in [name] and are on their way to the hospital per [facility] to provide support.  CSW spoke to [place] [facility] and notified her of above situation.  She stated that there would be a bed for patient tomorrow if medically stable for transport. CSW will re-evaluate with MD in the morning.  Lorie Phenix. Pauline Good, [place]

## 2015-06-24 NOTE — Progress Notes (Signed)
Patient ID: [name], female   DOB: 04-Aug-1966, 49 y.o.   MRN: [id number]   SUBJECTIVE: Patient was admitted with nausea, vomiting, and weight gain. Pulmonary edema on CXR.    CVVHD stopped 06/21/15. Ultrafiltered 27 pounds overall.   Seen by palliative 06/22/15. Wishes to remain full code at this time.   Seen by palliative care again on 06/23/15, now interested in comfort measures and hospice care.   Daughter present in room. Too weak to stand for weights this morning. Feels ok this morning, just tired and very week.  Has some N/V this morning.   Remains on Dobutamine @ 4. Cr continues to steeply worsen with d/c of CVVHD. 1.4 -> 2.01 ->2.80 ->3.64. Co-ox 58.9%. CVP 22 550 cc of urine out total yesterday on 160 mg po lasix q6 with 5 mg metolazone BID.  ~275 cc of dark yellow urine in foley bag this am.   Scheduled Meds: . allopurinol  300 mg Oral Daily  . ALPRAZolam  0.5 mg Oral TID  . amiodarone  200 mg Oral BID  . aspirin EC  81 mg Oral Daily  . enoxaparin (LOVENOX) injection  40 mg Subcutaneous Q24H  . furosemide  160 mg Oral Q6H  . gabapentin  600 mg Oral BID  . insulin aspart  0-15 Units Subcutaneous TID WC  . magnesium oxide  400 mg Oral BID  . metolazone  5 mg Oral BID  . pantoprazole  40 mg Oral Daily  . sildenafil  40 mg Oral TID  . sodium chloride  10-40 mL Intracatheter Q12H   Continuous Infusions: . sodium chloride    . DOBUTamine 4 mcg/kg/min (06/23/15 2000)   PRN Meds:.albuterol, cyclobenzaprine, ondansetron (ZOFRAN) IV, oxyCODONE-acetaminophen **AND** oxyCODONE, phenol, sodium chloride, white petrolatum    Filed Vitals:   06/24/15 0200 06/24/15 0300 06/24/15 0400 06/24/15 0500  BP:  101/70 103/69 98/76  Pulse:      Temp:   97.7 F (36.5 C)   TempSrc:   Oral   Resp: Height:      Weight:    260 lb 9.3 oz (118.2 kg)  SpO2: 100% 93% 91% 93%    Intake/Output Summary (Last 24 hours) at 06/24/15 0704 Last data filed at 06/24/15 0600  Gross per 24 hour  Intake 270.87 ml  Output    550 ml  Net -279.13 ml    LABS: Basic Metabolic Panel:  Recent Labs  40/10/27 0430 06/23/15 0443 06/24/15 0500  NA 128* 124* 124*  124*  K 4.6 4.8 4.4  4.4  CL 94* 92* 90*  90*  CO2 GLUCOSE 114* 125* 125*  125*  BUN 19 32* 50*  50*  CREATININE 2.01* 2.80* 3.57*  3.64*  CALCIUM 8.4* 8.1* 8.4*  8.4*  MG 2.5*  --   --   PHOS 2.7 4.0 5.0*   Liver Function Tests:  Recent Labs  06/23/15 0443 06/24/15 0500  ALBUMIN 2.7* 2.8*   No results for input(s): LIPASE, AMYLASE in the last 72 hours. CBC:  Recent Labs  06/24/15 0500  WBC 4.9  HGB 11.1*  HCT 34.1*  MCV 90.5  PLT 243   Cardiac Enzymes: No results for input(s): CKTOTAL, CKMB, CKMBINDEX, TROPONINI in the last 72 hours. BNP: Invalid input(s): POCBNP D-Dimer: No results for input(s): DDIMER in the last 72 hours. Hemoglobin A1C: No results for input(s): HGBA1C in the last 72 hours. Fasting Lipid Panel: No results for  input(s): CHOL, HDL, LDLCALC, TRIG, CHOLHDL, LDLDIRECT in the last 72 hours. Thyroid Function Tests: No results for input(s): TSH, T4TOTAL, T3FREE, THYROIDAB in the last 72 hours.  Invalid input(s): FREET3 Anemia Panel: No results for input(s): VITAMINB12, FOLATE, FERRITIN, TIBC, IRON, RETICCTPCT in the last 72 hours.  RADIOLOGY: Ct Abdomen Pelvis Wo Contrast  July 08, 2015  CLINICAL DATA:  Uncontrolled vomiting for 5 days EXAM: CT ABDOMEN AND PELVIS WITHOUT CONTRAST TECHNIQUE: Multidetector CT imaging of the abdomen and pelvis was performed following the standard protocol without IV contrast. COMPARISON:  01/20/2015 FINDINGS: Subsegmental atelectasis at the left lung base. Severe cardiomegaly. AICD device. Postcholecystectomy The left lobe of the liver is prominent but there is no evidence of nodularity of the contour of the liver. Spleen, pancreas, adrenal glands, and kidneys are within normal limits. There is significant stranding  throughout the subcutaneous fat across the abdomen. Bladder and uterus are within normal limits. There is a 1.9 cm hyperdense area within the left adnexa of unknown significance. It may represent an ovarian complex cyst. There is a small amount of free fluid layering in the pelvis. There is no obvious retroperitoneal adenopathy. IMPRESSION: Small amount of free fluid in the pelvis is nonspecific. 1.9 cm hyperdense abnormality in the left adnexa may represent a lesion in the ovary. Pelvic ultrasound is warranted. There is edema within the subcutaneous fat across the abdomen of unknown significance. Left basilar subsegmental atelectasis. Electronically Signed   By: Jolaine Click M.D.   On: 07-08-2015 19:02   Dg Chest Portable 1 View  06/16/2015  CLINICAL DATA:  48 year old female central line placement. Initial encounter. EXAM: PORTABLE CHEST 1 VIEW COMPARISON:  2015/07/08 and earlier. FINDINGS: Portable AP semi upright view at 1154 hours. Continued low lung volumes. Pulmonary vascular congestion has regressed. Stable left chest cardiac AICD. Stable cardiac size and mediastinal contours. Right PICC line remains in place, tip projects at the level of the medial right subclavian vein. An enteric tube is now in place in courses to the epigastrium, tip not included. There is a new left IJ approach dual lumen dialysis type catheter. Catheter tips project at the cavoatrial junction level. No pneumothorax. Continued retrocardiac opacity. IMPRESSION: 1. Left IJ approach dual lumen dialysis type catheter placed with no adverse features. 2. Enteric tube placed, courses to the abdomen tip not included. 3. Stable right PICC line. 4. Interval regressed pulmonary edema. Electronically Signed   By: Odessa Fleming M.D.   On: 06/16/2015 12:04   Dg Chest Portable 1 View  July 08, 2015  CLINICAL DATA:  Bradycardia, CHF, chronic milrinone infusion, nausea and vomiting EXAM: PORTABLE CHEST 1 VIEW COMPARISON:  05/26/2015 FINDINGS: Right PICC  line tip remains at the right innominate vein as before. Left subclavian multilead defibrillator/pacer evident. Heart is enlarged with vascular and interstitial prominence suggesting mild edema. Limited exam because of body habitus. Increased left base density suggest chronic atelectasis when compared to the prior CT. IMPRESSION: Cardiomegaly with increased vascular and interstitial prominence suggesting mild developing edema pattern compare to 05/26/2015. Electronically Signed   By: Judie Petit.  Shick M.D.   On: July 08, 2015 17:25   Dg Chest Port 1 View  05/26/2015  CLINICAL DATA:  Shortness of breath and syncope 1 day. EXAM: PORTABLE CHEST - 1 VIEW COMPARISON:  05/16/2015 FINDINGS: Right-sided PICC line has been pulled back slightly with tip overlying the region of the SVC near the junction of the subclavian vein. Left-sided pacemaker unchanged. Lungs are hypoinflated with mild prominence of the perihilar markings suggesting mild  vascular congestion. There is mild stable cardiomegaly. Remainder of the exam is unchanged. IMPRESSION: Stable mild cardiomegaly with evidence of mild vascular congestion. Right-sided PICC line has been pulled back slightly with tip over the region of the SVC near the junction with the subclavian vein. Electronically Signed   By: Elberta Fortis M.D.   On: 05/26/2015 21:02   Dg Abd Portable 1v  06/16/2015  CLINICAL DATA:  Status post nasogastric catheter placement EXAM: PORTABLE ABDOMEN - 1 VIEW COMPARISON:  None. FINDINGS: Scattered large and small bowel gas is noted. A nasogastric catheter is noted deep within the stomach. IMPRESSION: Nasogastric catheter within the stomach. Electronically Signed   By: Alcide Clever M.D.   On: 06/16/2015 09:28    PHYSICAL EXAM CVP 22 General: NAD Neck: Thick, JVP difficult but appears elevated, no thyromegaly or nodule noted Lungs: CTA with no resp distress and normal effort. CV: Nondisplaced PMI.  Heart regular S1/S2, no S3/S4, no murmur. 2+ LE edema  up to knees.   Abdomen: Obese, soft, nontender, no HSM, distention.  Neurologic: Alert and oriented x 3.  Psych: awake, alert, oriented. Flat affect Extremities: No clubbing or cyanosis.   TELEMETRY:  NSR 70s with BiV pacing.  ASSESSMENT AND PLAN: Admitted with elevated weight, nausea/vomiting, and pulmonary edema on CXR.  Volume has been very difficult to control at home despite large diuretic doses at home.  Nephrology consulted. Started on CVVHD.   1. Acute on chronic systolic CHF: Nonischemic cardiomyopathy, s/p [employer] CRT-D. EF 30-35% (03/2015 - on milrinone). Has been on home milrinone 0.375 mcg/kg/min. NYHA class IIIb symptoms chronically.  - Weight had improved with CVVH, but trending back up. - Co-ox marginal at 58%. CVP 22 this morning.  Continue dobutamine 4.   - Off CVVH as of 06/21/15.  She is not a candidate for long-term IHD - Remains on Lasix 160 mg every 6 hrs with metolazone, having minimal UOP. - Poor LVAD candidate give lack of social support and size/weight, turned down at this center. Referred her to [facility] for 2nd opinion but had not yet gone.  Unfortunately, options are running low at this point.  2. Cardiogenic shock: Transitioned to dobutamine with renal failure as above. - Continue dobutamine 4 with co-ox 58%.  3. VT/VF, recurrent: On amiodarone 200 mg bid.  - Ranolazine stopped in setting of severe renal failure 4. H/O GI bleed with AVM: Stable, no evidence for bleeding.  5. Pulmonary HTN: RHC 05/01/15 PVR 4.0. Continue current dose of sildenafil.  6. AKI on CKD:  Off CVVH.  Difficult IHD candidate. Renal following. - Creatinine continues to worsen with very poor UO on high dose po lasix.  Seen by palliative care with family yesterday.  No further life prolonging measures.  Partial code while in hospital, if discharged will d/c all vasopressors and become DNR/DNI. N/V this morning, has zofran ordered. Will give a dose now.   [name],  PA-C 7:04 AM   Advanced Heart Failure Team Pager [phone] (M-F; 7a - 4p)  Please contact [facility] for night-coverage after hours (4p -7a ) and weekends on [email or web address]  Patient seen seen with PA, agree with the above note.   Unfortunately, we have no further good options for Mrs Marschner.  Cardiogenic shock requiring inotrope and now with ESRD.  Minimal renal recovery off CVVH, not enough UOP to keep off fluid overload despite high dose diuretics.  CVP rising, 22 this morning.  She is not a good candidate for  intermittent HD with her end-stage cardiomyopathy.  She is not a candidate for transplant, LVAD, etc.  She has been seen by palliative care service.  Plan at this time is to go to the hospice house in [place].  When plans are finalized for this, will turn off shock therapies of ICD (she understands this and agrees) and will stop dobutamine.  She will go to hospice on Lasix 160 mg bid + metolazone 5 mg daily.  Can continue amiodarone as well.   [name] 06/24/2015 8:03 AM

## 2015-06-24 NOTE — Progress Notes (Signed)
Patient ID: [name], female   DOB: 1965/09/24, 49 y.o.   MRN: [id number] S:pt is lethargic and tired O:BP 98/76 mmHg  Pulse 73  Temp(Src) 97.7 F (36.5 C) (Oral)  Resp 16  Ht 5\' 5"  (1.651 m)  Wt 118.2 kg (260 lb 9.3 oz)  BMI 43.36 kg/m2  SpO2 93%  LMP 01/19/2014  Intake/Output Summary (Last 24 hours) at 06/24/15 0735 Last data filed at 06/24/15 0600  Gross per 24 hour  Intake 270.87 ml  Output    550 ml  Net -279.13 ml   Intake/Output: I/O last 3 completed shifts: In: 512.7 [P.O.:150; I.V.:362.7] Out: 805 [Urine:805]  Intake/Output this shift:    Weight change: 0.6 kg (1 lb 5.2 oz) Gen:WD obese AAF lethargic TMB:PJPET HS Resp:cta KKO:ECXFQH Ext:+1 edema   Recent Labs Lab 06/20/15 0405 06/20/15 1545 06/21/15 0420 06/21/15 1625 06/22/15 0430 06/23/15 0443 06/24/15 0500  NA 130* 132* 135 130* 128* 124* 124*  124*  K 3.8 3.8 4.0 3.9 4.6 4.8 4.4  4.4  CL 93* 96* 99* 96* 94* 92* 90*  90*  CO2 28 26 28 27 27 24 24  22   GLUCOSE 188* 174* 98 152* 114* 125* 125*  125*  BUN 14 12 11 13 19  32* 50*  50*  CREATININE 1.28* 1.29* 1.07* 1.40* 2.01* 2.80* 3.57*  3.64*  ALBUMIN 2.5* 2.7* 2.7* 2.8* 2.9* 2.7* 2.8*  CALCIUM 8.1* 8.1* 8.5* 8.1* 8.4* 8.1* 8.4*  8.4*  PHOS 1.9* 1.9* 1.7* 1.9* 2.7 4.0 5.0*  AST  --   --  81*  --   --   --   --   ALT  --   --  89*  --   --   --   --    Liver Function Tests:  Recent Labs Lab 06/21/15 0420  06/22/15 0430 06/23/15 0443 06/24/15 0500  AST 81*  --   --   --   --   ALT 89*  --   --   --   --   ALKPHOS 119  --   --   --   --   BILITOT 1.4*  --   --   --   --   PROT 7.4  --   --   --   --   ALBUMIN 2.7*  < > 2.9* 2.7* 2.8*  < > = values in this interval not displayed. No results for input(s): LIPASE, AMYLASE in the last 168 hours.  Recent Labs Lab 06/17/15 0807  AMMONIA 32   CBC:  Recent Labs Lab 06/18/15 0400  06/20/15 0405 06/21/15 0420 06/24/15 0500  WBC 8.4  --  5.6 4.6 4.9  HGB 11.8*  < > 11.6*  11.7* 11.1*  HCT 37.2  < > 36.7 36.8 34.1*  MCV 96.1  --  94.3 93.2 90.5  PLT 275  --  241 269 243  < > = values in this interval not displayed. Cardiac Enzymes: No results for input(s): CKTOTAL, CKMB, CKMBINDEX, TROPONINI in the last 168 hours. CBG:  Recent Labs Lab 06/23/15 0741 06/23/15 1119 06/23/15 2005 06/24/15 0033 06/24/15 0450  GLUCAP 115* 114* 124* 88 126*    Iron Studies: No results for input(s): IRON, TIBC, TRANSFERRIN, FERRITIN in the last 72 hours. Studies/Results: No results found. Marland Kitchen allopurinol  300 mg Oral Daily  . ALPRAZolam  0.5 mg Oral TID  . amiodarone  200 mg Oral BID  . aspirin EC  81 mg Oral Daily  .  enoxaparin (LOVENOX) injection  40 mg Subcutaneous Q24H  . furosemide  160 mg Oral Q6H  . gabapentin  600 mg Oral BID  . insulin aspart  0-15 Units Subcutaneous TID WC  . magnesium oxide  400 mg Oral BID  . metolazone  5 mg Oral BID  . pantoprazole  40 mg Oral Daily  . sildenafil  40 mg Oral TID  . sodium chloride  10-40 mL Intracatheter Q12H    BMET    Component Value Date/Time   NA 124* 06/24/2015 0500   NA 124* 06/24/2015 0500   K 4.4 06/24/2015 0500   K 4.4 06/24/2015 0500   CL 90* 06/24/2015 0500   CL 90* 06/24/2015 0500   CO2 24 06/24/2015 0500   CO2 22 06/24/2015 0500   GLUCOSE 125* 06/24/2015 0500   GLUCOSE 125* 06/24/2015 0500   BUN 50* 06/24/2015 0500   BUN 50* 06/24/2015 0500   CREATININE 3.57* 06/24/2015 0500   CREATININE 3.64* 06/24/2015 0500   CREATININE 0.83 02/06/2012 1520   CALCIUM 8.4* 06/24/2015 0500   CALCIUM 8.4* 06/24/2015 0500   GFRNONAA 14* 06/24/2015 0500   GFRNONAA 14* 06/24/2015 0500   GFRAA 16* 06/24/2015 0500   GFRAA 16* 06/24/2015 0500   CBC    Component Value Date/Time   WBC 4.9 06/24/2015 0500   RBC 3.77* 06/24/2015 0500   RBC 4.48 04/26/2011 1213   HGB 11.1* 06/24/2015 0500   HCT 34.1* 06/24/2015 0500   PLT 243 06/24/2015 0500   MCV 90.5 06/24/2015 0500   MCH 29.4 06/24/2015 0500   MCHC 32.6  06/24/2015 0500   RDW 16.8* 06/24/2015 0500   LYMPHSABS 0.9 06/09/2015 0845   MONOABS 0.4 06/09/2015 0845   EOSABS 0.1 06/09/2015 0845   BASOSABS 0.0 06/09/2015 0845     Assessment/Plan:  1. AKI/CKD- related to cardiorenal syndrome/cardiogenic shock- Has been non-oliguric with high dose lasix however UOP decreasing. CVVHD stopped 06/21/15 with plan to transition to IHD if her renal function does not improve, however pt poor candidate for longterm hemodialysis. Agree with palliative care involvement to help set goals/limits of care. She is making urine (only 805cc yesterday) but doubtful she will recover renal function due to ongoing hypotension and dependence upon pressors. 1. Hold off on dialysis as she has agreed to transition to comfort measures.  2. In light of increasing dependence on dobutamine despite volume control, ongoing dialysis would not be helpful and likely detrimental with persistent hypotension. 2. Acute on chronic systolic CHF due to NICMP (EF 86-57%) had been on home milrinone and large doses of diuretics. Ultrafiltered 27 lbs since admission but still with evidence of volume overload.  1. Continue dobutamine per CHF team.  2. Not responding well to IV Lasix and metolazone (UOP 805).   3. Cardiogenic shock- transition to dobutamine per Cards. She remains hypotensive and dependent upon pressor support. 4. Recurrent VT/VF- on ranolazine and amiodarone. Plan per cards 5. Pulmonary htn-on sildenafil 6. Morbid obesity 7. Disposition- poor overall prognosis with the development of renal failure. Agree with transfer to hospice today as she has significantly declined in 24 hours.  Appreciate Palliative care team's assistance and kindness.  [name] A

## 2015-06-24 NOTE — Progress Notes (Signed)
Daily Progress Note   Patient Name: [name]       Date: 06/24/2015 DOB: 27-Dec-1965  Age: 49 y.o. MRN#: [id number] Attending Physician: Barbara Morale, MD Primary Care Physician: Barbara Obey, MD Admit Date: 07-03-2015  Reason for Consultation/Follow-up: Establishing goals of care, Inpatient hospice referral, Non pain symptom management, Pain control and Psychosocial/spiritual support  Subjective:     - daughter [name]  present at bedside,  - hopeful for hospice facility today  Patient is full comfort care.  ICD being turned off this morning.  Pressors will be stopped on discharge   No life prolonging measurers.     -  (I meet last evening with patient and her daughter and  [name]   Barbara Santee has along this disease trajectory committed to [place]   to caring for [name] until she is 49 yo.   In an attempt to have something written down to that effect a short note has been hand written and signed by both patient and Barbara Santee. I witnessed this interaction. Barbara Santee is now taking on total responsibitlity for [name].  Maternal Grandparents are in support of this guardianship       Length of Stay: 10 days  Current Medications: Scheduled Meds:  . allopurinol  300 mg Oral Daily  . ALPRAZolam  0.5 mg Oral TID  . amiodarone  200 mg Oral BID  . aspirin EC  81 mg Oral Daily  . [START ON 07/02/2015] enoxaparin (LOVENOX) injection  30 mg Subcutaneous Q24H  . furosemide  160 mg Oral Q6H  . gabapentin  600 mg Oral BID  . insulin aspart  0-15 Units Subcutaneous TID WC  . magnesium oxide  400 mg Oral BID  . metolazone  5 mg Oral BID  . pantoprazole  40 mg Oral Daily  . sildenafil  40 mg Oral TID  . sodium chloride  10-40 mL Intracatheter Q12H    Continuous Infusions: . sodium chloride    . DOBUTamine 4 mcg/kg/min (06/23/15 2000)    PRN Meds: albuterol, cyclobenzaprine, ondansetron (ZOFRAN) IV, oxyCODONE-acetaminophen **AND** oxyCODONE, phenol, sodium  chloride, white petrolatum  Palliative Performance Scale:  20 %     Vital Signs: BP 98/76 mmHg  Pulse 73  Temp(Src) 97.7 F (36.5 C) (Axillary)  Resp 16  Ht 5\' 5"  (1.651 m)  Wt 118.2 kg (260 lb 9.3 oz)  BMI 43.36 kg/m2  SpO2 93%  LMP 01/19/2014 SpO2: SpO2: 93 % O2 Device: O2 Device: Not Delivered O2 Flow Rate: O2 Flow Rate (L/min): 2 L/min  Intake/output summary:   Intake/Output Summary (Last 24 hours) at 06/24/15 0829 Last data filed at 06/24/15 0600  Gross per 24 hour  Intake 260.67 ml  Output    550 ml  Net -289.33 ml   LBM: Last BM Date: 06/19/15 Baseline Weight: Weight: 123.832 kg (273 lb) Most recent weight: Weight: 118.2 kg (260 lb 9.3 oz) (Bed weight due to pt. unable to stand unassisted )  Physical Exam:              General: ill appearing, more  lethargic and weak this morning HEENT: moist buccal membranes, no exudate CVS: RRR Resp: CTA AXK:PVVZS, +BS Skin: warm and dry Neuro: oriented X3, lethargic   Additional Data Reviewed: Recent Labs     06/23/15  0443  06/24/15  0500  WBC   --   4.9  HGB   --   11.1*  PLT   --   243  NA  124*  124*  124*  BUN  32*  50*  50*  CREATININE  2.80*  3.57*  3.64*     Problem List:  Patient Active Problem List   Diagnosis Date Noted  . Weakness generalized   . Acute on chronic combined systolic and diastolic congestive heart failure, NYHA class 4 (HCC) July 04, 2015  . Nausea & vomiting 07-04-15  . Bradycardia 05/12/2015  . Palliative care encounter 05/09/2015  . DNR (do not resuscitate) discussion 05/09/2015  . Atrial fibrillation [I48.91] 04/30/2015  . Acute on chronic systolic heart failure, NYHA class 3 (HCC) 04/16/2015  . Systolic CHF, acute on chronic (HCC) 04/16/2015  . Pulmonary hypertension (HCC) 04/15/2015  . Elevated d-dimer   . Faintness   . VT (ventricular tachycardia) (HCC)   . Cardiogenic shock (HCC)   . Congestive heart disease (HCC)   . Ventricular fibrillation (HCC)   . Chest  pain 03/26/2015  . Syncope 03/26/2015  . Dizziness 01/26/2015  . Fatigue 01/26/2015  . Acute on chronic renal insufficiency (HCC) 01/04/2015  . Torsades de pointes (HCC)   . Non-ischemic cardiomyopathy (HCC)   . Multiple thyroid nodules   . Primary gout   . Paroxysmal ventricular tachycardia (HCC) 08/06/2014  . Hyperglycemia   . Essential hypertension   . Morbid obesity (HCC) 12/23/2013  . OSA on CPAP 12/23/2013  . Implanon removal 12/04/2013  . Degenerative cervical disc 08/21/2013  . Cervical spondylosis without myelopathy 08/21/2013  . Left rotator cuff tear 08/21/2013  . Bursitis of shoulder 08/06/2013  . Hyperglycemia without ketosis 06/09/2013  . 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 Peripheral neuropathy (HCC) 04/26/2011    Class: Chronic  . Mitral regurgitation 03/01/2011  . Constipation 05/04/2010  . RECTAL BLEEDING 05/04/2010  . ICD- BS 2007 03/01/2010  . GERD (gastroesophageal reflux disease) 10/12/2009  . Chronic systolic CHF 09/15/2009  . Iron deficiency anemia 07/19/2009     Palliative Care Assessment & Plan    Code Status:  Remain on pressors until discharge, on discharged DNR/DNI  ICD to be turned off this morning, [employer]  Notified per [name]   Goals of Care:   Comfort, quality and dignity--no further life prolonging meaures  Desire for further Chaplaincy support:yes    Prognosis: hrs to days   Discharge Planning: Hospice facility, hopful for dc today    Care plan was discussed with [name] and [name]  Thank you for allowing the Palliative Medicine Team to assist in the care of this patient.   Time In: 0745 Time Out: 0820 Total Time 35 min Prolonged Time Billed  no    Greater than 50%  of this time was spent counseling and coordinating care related to the above assessment and plan.     Barbara Brim, NP  06/24/2015, 8:29 AM  Please contact Palliative Medicine Team phone at [phone] for questions  and concerns.

## 2015-06-24 NOTE — Patient Outreach (Signed)
[facility]) Care Management  [facility] Social Work  06/24/2015  Barbara WAHLMAN [birth year] [id number]  Subjective:    Objective:   Current Medications:  No current facility-administered medications for this visit.   No current outpatient prescriptions on file.   Facility-Administered Medications Ordered in Other Visits  Medication Dose Route Frequency Provider Last Rate Last Dose  . 0.9 %  sodium chloride infusion   Intravenous Continuous Laurey Morale, MD      . albuterol (PROVENTIL) (2.5 MG/3ML) 0.083% nebulizer solution 2.5 mg  2.5 mg Nebulization Q6H PRN Laurey Morale, MD      . ALPRAZolam Prudy Feeler) tablet 0.5 mg  0.5 mg Oral TID Katheran Awe, NP   0.5 mg at 06/24/15 1027  . DOBUTamine (DOBUTREX) infusion 4000 mcg/mL  2 mcg/kg/min Intravenous Titrated Mariam Dollar Tillery, PA-C 3.4 mL/hr at 06/24/15 1136 2 mcg/kg/min at 06/24/15 1136  . LORazepam (ATIVAN) injection 1 mg  1 mg Intravenous Q4H PRN Canary Brim, NP      . morphine 2 MG/ML injection 1 mg  1 mg Intravenous Q1H PRN Canary Brim, NP      . ondansetron [facility]) injection 4 mg  4 mg Intravenous Q6H PRN Laurey Morale, MD   4 mg at 06/24/15 0756  . oxyCODONE-acetaminophen (PERCOCET/ROXICET) 5-325 MG per tablet 1 tablet  1 tablet Oral Q4H PRN Laurey Morale, MD   1 tablet at 06/22/15 2102   And  . oxyCODONE (Oxy IR/ROXICODONE) immediate release tablet 5 mg  5 mg Oral Q4H PRN Laurey Morale, MD   5 mg at 06/16/15 0403  . phenol (CHLORASEPTIC) mouth spray 1 spray  1 spray Mouth/Throat PRN Yvonne Kendall, MD   1 spray at 06/20/15 2043  . sodium chloride 0.9 % injection 10-40 mL  10-40 mL Intracatheter Q12H Laurey Morale, MD   10 mL at 06/23/15 2200  . sodium chloride 0.9 % injection 10-40 mL  10-40 mL Intracatheter PRN Laurey Morale, MD      . white petrolatum (VASELINE) gel 1 application  1 application Topical PRN Laurey Morale, MD        Functional Status:  In your present state of health,  do you have any difficulty performing the following activities: 06/26/2015 04/16/2015  Hearing? N N  Vision? N N  Difficulty concentrating or making decisions? N N  Walking or climbing stairs? Y Y  Dressing or bathing? N N  Doing errands, shopping? N N  Preparing Food and eating ? - -  Using the Toilet? - -  In the past six months, have you accidently leaked urine? - -  Do you have problems with loss of bowel control? - -  Managing your Medications? - -  Managing your Finances? - -  Housekeeping or managing your Housekeeping? - -    Fall/Depression Screening:  PHQ 2/9 Scores 05/28/2015 03/01/2015 01/29/2015 02/27/2014  PHQ - 2 Score 2 0 2 0  PHQ- 9 Score - - 6 -    Assessment:  CSW was informed by RN Marja Kays on 06/24/15 that client was now receiving care from [facility].  Thus, RN Marja Kays discharged client from [facility] nursing support on 06/24/15.  Likewise, [facility] CSW is discharging client from [facility] CSW services on 06/24/15 since client is now receiving Hospice care and is under the care of [facility] [place], [place].   Plan: CSW to inform Nena Polio on 06/24/15 that [facility]  CSW discharged client on 06/24/15 due to client now being under care with [facility] [place], [place]. CSW to send primary doctor of client a physician case closure letter on 06/24/15.  Kelton Pillar.Viraaj Vorndran MSW, LCSW Licensed Clinical Social Worker [facility] Care Management [phone] Plan:

## 2015-06-25 LAB — GLUCOSE, CAPILLARY: Glucose-Capillary: 84 mg/dL (ref 65–99)

## 2015-06-25 MED ORDER — ATROPINE SULFATE 1 % OP SOLN
4.0000 [drp] | OPHTHALMIC | Status: DC
  Administered 2015-06-25: 4 [drp] via SUBLINGUAL
  Filled 2015-06-25: qty 2

## 2015-06-25 MED ORDER — LORAZEPAM 2 MG/ML IJ SOLN
1.0000 mg | INTRAMUSCULAR | Status: DC | PRN
  Administered 2015-06-25 (×2): 1 mg via INTRAVENOUS
  Filled 2015-06-25 (×2): qty 1

## 2015-06-25 MED ORDER — MORPHINE SULFATE 25 MG/ML IV SOLN
5.0000 mg/h | INTRAVENOUS | Status: DC
  Administered 2015-06-25: 2 mg/h via INTRAVENOUS
  Filled 2015-06-25: qty 10

## 2015-06-28 ENCOUNTER — Telehealth (HOSPITAL_COMMUNITY): Payer: Self-pay

## 2015-06-28 NOTE — Telephone Encounter (Signed)
I will sign

## 2015-06-28 NOTE — Telephone Encounter (Signed)
[name], from [facility] [phone]) would like to know if he will sign the death certificate this week on this patient and if she needs to send it here to be signed.

## 2015-07-05 ENCOUNTER — Encounter (HOSPITAL_COMMUNITY): Payer: Medicare Other

## 2015-07-06 ENCOUNTER — Encounter (HOSPITAL_COMMUNITY): Payer: Self-pay | Admitting: *Deleted

## 2015-07-06 NOTE — Progress Notes (Signed)
Death Certificate completed and signed by Dr [name], original mailed to [facility].

## 2015-07-09 ENCOUNTER — Encounter: Payer: Medicare Other | Admitting: Internal Medicine

## 2015-07-13 NOTE — Discharge Summary (Signed)
Advanced Heart Failure Team  Discharge Summary   Patient ID: [name] MRN: [id number], DOB/AGE: Oct 03, 1965 49 y.o. Admit date: 07/12/2015 D/C date:     2015-07-11   Primary Discharge Diagnoses:  1. Acute on chronic systolic CHF:  2. Severe Nonischemic cardiomyopathy, s/p [employer] CRT-D. EF 30-35% (03/2015 - on milrinone).  3. Cardiogenic shock 4. VT/VF, recurrent 5. H/O GI bleed with AVM: Stable, no evidence for bleeding.  6. Pulmonary HTN: RHC 05/01/15 PVR 4.0.  7. AKI on CKD stage 3:  8. Hyponatremia 9. Hypoalbuminemia 10. Transaminitis  This patient is deceased.  Consults: Renal, Palliative Care  Hospital Course:   [name] was a 49 year old female with a history of NICM s/p BiV ICD 2007 [facility] [employer]), chronic systolic HF, DM2, HTN, recurrent VT, morbid obesity and OSA on CPAP.  She had multiple admissions this year with worsening HF and arrythmias. She was initially considered for LVAD but was ultimately determined not to be a candidate at this facility due to comorbidities including size/weight and uncertain level of social support. She was referred to [facility] for a second opinion but had not gone by the time of her most recent admission. She had been on home milrinone since 7/16.  She was admitted on 06/13/2015 with N/V, watery stools, and abdominal pain. This was thought to be a symptom of hypoperfusion 2/2 cardiogenic shock. She was placed on IV lasix gtt up to 15 mg/hr with metolazone but had poor diuresis despite significant volume overload. Renal was consulted and pt placed on CVVH 06/16/15. She had good output with improving Cr on CVVH, and became more alert as she diuresed. CVVH stopped 06/21/15 once nearly euvolemic to see how pts kidneys recovered, Pt was not a candidate for long term dialysis. She was transitioned to dobutamine from milrinone with rapid worsening Cr once CVVH stopped. Was seen by palliative starting 06/23/15 with worsening prognosis, and was  made partial code while on pressors, with plans to d/c to hospice as a DNR.     Pt planned to d/c to [facility] of [place] on 06/24/15 but with worsening clinical condition while on 4 mcg/min of dobutamine, pt was found not to be a good candidate for transfer. In speaking with the family and palliative care, the transition was made to full comfort care with weaning of dobutamine and comfort meds.  Every reasonable effort was made to improve this patients clinical course during this admission. She continued to worsen throughout the evening and into the following day.  Morphine and ativan were increased with worsening dyspnea and transitioned to morphine drip in the afternoon. Pt was found to be in asystole on the monitor and without respirations or heart sounds at 2125 on July 11, 2015. Confirmed by 2 RNs and MD notified.      Disposition   The patient is deceased.     Duration of Discharge Encounter: Greater than 35 minutes   Signed, [name] PA-C 06/28/2015, 9:53 AM

## 2015-07-13 NOTE — Progress Notes (Signed)
   07-24-15 1300  Clinical Encounter Type  Visited With Family;Patient not available  Visit Type Patient actively dying  Referral From Chaplain;Nurse  Consult/Referral To Chaplain  Spiritual Encounters  Spiritual Needs Emotional;Grief support  CH referral to check in on family to see the status and assess spiritual/grief support.  CH introduced and made family aware of spiritual support available.  CH available as needed.  Support may be needed for 46yr old daughter of pt.  1:55 PM Erline Levine

## 2015-07-13 NOTE — Progress Notes (Signed)
Dr. Eliot Ford note appreciated. DC to [facility] home has been cancelled this morning as hospital death is anticipated.  Notified Cindy at [facility] of above.  Lorri Frederick. Jaci Lazier, [place] [phone]

## 2015-07-13 NOTE — Progress Notes (Signed)
Patient ID: [name], female   DOB: 1966-04-01, 49 y.o.   MRN: [id number]   SUBJECTIVE: Patient was admitted with nausea, vomiting, and weight gain. Pulmonary edema on CXR.    CVVHD stopped 06/21/15. Ultrafiltered 27 pounds overall.   Seen by palliative 06/22/15. Wishes to remain full code at this time.   Seen by palliative care again on 06/23/15, Made partial code (while on dobutamine) with plans to be made DNR once dobutamine d/c as pt left the hospital.   Made full comfort care 06/24/15.   Morphone 1 mg ~ q 4 over night.  Pt is actively dying.  Decreasing responsiveness and increasing dyspnea. No labs this morning. Dobutamine weaned 06/24/15.   Scheduled Meds: . sodium chloride  10-40 mL Intracatheter Q12H   Continuous Infusions: . sodium chloride 5 mL/hr at 06/19/2015 0146   PRN Meds:.LORazepam, morphine injection, ondansetron (ZOFRAN) IV, oxyCODONE-acetaminophen **AND** oxyCODONE, phenol, sodium chloride, white petrolatum    Filed Vitals:   06/14/2015 0300 06/24/2015 0400 06/24/2015 0500 06/30/2015 0600  BP: 95/53     Pulse:      Temp:      TempSrc:      Resp: Height:      Weight:   259 lb 11.2 oz (117.8 kg)   SpO2: 93% 94%  94%    Intake/Output Summary (Last 24 hours) at 06/23/2015 0707 Last data filed at 06/29/2015 0600  Gross per 24 hour  Intake  227.6 ml  Output    570 ml  Net -342.4 ml    LABS: Basic Metabolic Panel:  Recent Labs  40/98/11 0443 06/24/15 0500  NA 124* 124*  124*  K 4.8 4.4  4.4  CL 92* 90*  90*  CO2 GLUCOSE 125* 125*  125*  BUN 32* 50*  50*  CREATININE 2.80* 3.57*  3.64*  CALCIUM 8.1* 8.4*  8.4*  PHOS 4.0 5.0*   Liver Function Tests:  Recent Labs  06/23/15 0443 06/24/15 0500  ALBUMIN 2.7* 2.8*   No results for input(s): LIPASE, AMYLASE in the last 72 hours. CBC:  Recent Labs  06/24/15 0500  WBC 4.9  HGB 11.1*  HCT 34.1*  MCV 90.5  PLT 243   Cardiac Enzymes: No results for input(s):  CKTOTAL, CKMB, CKMBINDEX, TROPONINI in the last 72 hours. BNP: Invalid input(s): POCBNP D-Dimer: No results for input(s): DDIMER in the last 72 hours. Hemoglobin A1C: No results for input(s): HGBA1C in the last 72 hours. Fasting Lipid Panel: No results for input(s): CHOL, HDL, LDLCALC, TRIG, CHOLHDL, LDLDIRECT in the last 72 hours. Thyroid Function Tests: No results for input(s): TSH, T4TOTAL, T3FREE, THYROIDAB in the last 72 hours.  Invalid input(s): FREET3 Anemia Panel: No results for input(s): VITAMINB12, FOLATE, FERRITIN, TIBC, IRON, RETICCTPCT in the last 72 hours.  RADIOLOGY: Ct Abdomen Pelvis Wo Contrast  Jun 17, 2015  CLINICAL DATA:  Uncontrolled vomiting for 5 days EXAM: CT ABDOMEN AND PELVIS WITHOUT CONTRAST TECHNIQUE: Multidetector CT imaging of the abdomen and pelvis was performed following the standard protocol without IV contrast. COMPARISON:  01/20/2015 FINDINGS: Subsegmental atelectasis at the left lung base. Severe cardiomegaly. AICD device. Postcholecystectomy The left lobe of the liver is prominent but there is no evidence of nodularity of the contour of the liver. Spleen, pancreas, adrenal glands, and kidneys are within normal limits. There is significant stranding throughout the subcutaneous fat across the abdomen. Bladder and uterus are within normal limits. There is a 1.9 cm hyperdense  area within the left adnexa of unknown significance. It may represent an ovarian complex cyst. There is a small amount of free fluid layering in the pelvis. There is no obvious retroperitoneal adenopathy. IMPRESSION: Small amount of free fluid in the pelvis is nonspecific. 1.9 cm hyperdense abnormality in the left adnexa may represent a lesion in the ovary. Pelvic ultrasound is warranted. There is edema within the subcutaneous fat across the abdomen of unknown significance. Left basilar subsegmental atelectasis. Electronically Signed   By: Jolaine Click M.D.   On: 06-18-2015 19:02   Dg Chest  Portable 1 View  06/16/2015  CLINICAL DATA:  49 year old female central line placement. Initial encounter. EXAM: PORTABLE CHEST 1 VIEW COMPARISON:  18-Jun-2015 and earlier. FINDINGS: Portable AP semi upright view at 1154 hours. Continued low lung volumes. Pulmonary vascular congestion has regressed. Stable left chest cardiac AICD. Stable cardiac size and mediastinal contours. Right PICC line remains in place, tip projects at the level of the medial right subclavian vein. An enteric tube is now in place in courses to the epigastrium, tip not included. There is a new left IJ approach dual lumen dialysis type catheter. Catheter tips project at the cavoatrial junction level. No pneumothorax. Continued retrocardiac opacity. IMPRESSION: 1. Left IJ approach dual lumen dialysis type catheter placed with no adverse features. 2. Enteric tube placed, courses to the abdomen tip not included. 3. Stable right PICC line. 4. Interval regressed pulmonary edema. Electronically Signed   By: Odessa Fleming M.D.   On: 06/16/2015 12:04   Dg Chest Portable 1 View  06-18-2015  CLINICAL DATA:  Bradycardia, CHF, chronic milrinone infusion, nausea and vomiting EXAM: PORTABLE CHEST 1 VIEW COMPARISON:  05/26/2015 FINDINGS: Right PICC line tip remains at the right innominate vein as before. Left subclavian multilead defibrillator/pacer evident. Heart is enlarged with vascular and interstitial prominence suggesting mild edema. Limited exam because of body habitus. Increased left base density suggest chronic atelectasis when compared to the prior CT. IMPRESSION: Cardiomegaly with increased vascular and interstitial prominence suggesting mild developing edema pattern compare to 05/26/2015. Electronically Signed   By: Judie Petit.  Shick M.D.   On: 06-18-15 17:25   Dg Chest Port 1 View  05/26/2015  CLINICAL DATA:  Shortness of breath and syncope 1 day. EXAM: PORTABLE CHEST - 1 VIEW COMPARISON:  05/16/2015 FINDINGS: Right-sided PICC line has been pulled back  slightly with tip overlying the region of the SVC near the junction of the subclavian vein. Left-sided pacemaker unchanged. Lungs are hypoinflated with mild prominence of the perihilar markings suggesting mild vascular congestion. There is mild stable cardiomegaly. Remainder of the exam is unchanged. IMPRESSION: Stable mild cardiomegaly with evidence of mild vascular congestion. Right-sided PICC line has been pulled back slightly with tip over the region of the SVC near the junction with the subclavian vein. Electronically Signed   By: Elberta Fortis M.D.   On: 05/26/2015 21:02   Dg Abd Portable 1v  06/16/2015  CLINICAL DATA:  Status post nasogastric catheter placement EXAM: PORTABLE ABDOMEN - 1 VIEW COMPARISON:  None. FINDINGS: Scattered large and small bowel gas is noted. A nasogastric catheter is noted deep within the stomach. IMPRESSION: Nasogastric catheter within the stomach. Electronically Signed   By: Alcide Clever M.D.   On: 06/16/2015 09:28    PHYSICAL EXAM General: Agonal breathing Neck: Thick, JVP elevated, Lungs: Diffuse crackles and rhonci CV: Nondisplaced PMI.  Heart regular S1/S2, no S3/S4, no murmur.  Abdomen: Obese, soft, nontender, no HSM, distention.  Neurologic: minimally  responsive.  Opens eyes to voice or physical stimuli.  Extremities: No clubbing or cyanosis. 3+ LE edema up to thighs.    TELEMETRY:  NSR 70s  ASSESSMENT AND PLAN: Admitted with elevated weight, nausea/vomiting, and pulmonary edema on CXR.  Volume has been very difficult to control at home despite large diuretic doses at home.  Nephrology consulted. Started on CVVHD.   1. Acute on chronic systolic CHF: Nonischemic cardiomyopathy, s/p [employer] CRT-D. EF 30-35% (03/2015 - on milrinone).  - End stage. 2. Cardiogenic shock 3. VT/VF, recurrent 4. H/O GI bleed with AVM: Stable, no evidence for bleeding.  5. Pulmonary HTN: RHC 05/01/15 PVR 4.0.  6. AKI on CKD:  Off CVVH.   Pt is actively dying.  Family  had hoped to get her to a hospice facility today but with steadily decline over night, pt will likely pass in the next few hours.  Will give 2 mg morphine now.   [name], PA-C 7:07 AM   Advanced Heart Failure Team Pager [phone] (M-F; 7a - 4p)  Please contact [facility] for night-coverage after hours (4p -7a ) and weekends on [email or web address]  Patient seen with PA, agree with the above note.  She is actively dying and will likely expire in the next few hours.  Will give morphine as needed.   [name] 07/10/2015 7:48 AM

## 2015-07-13 NOTE — Progress Notes (Signed)
Patient without respirations and heart sounds for 1 full min. Skin mottled. Asystole on cardiac monitor. Family at bedside and aware of patients death. Pt is full DNR. 2 RNs confirmed death. Dr. Jones Broom notified.  225 ml of Morphine drip wasted in sink.   Hillery Aldo RN/  Amo Kuffour Alinda Money),  RN

## 2015-07-13 NOTE — Clinical Documentation Improvement (Signed)
Cardiology  (please document your response in the progress notes and discharge summary, not on the query form itself.)  Possible Clinical Conditions:  - Encephalopathy, including acuity and type  (acute, chronic, metabolic, toxic, etc)  - Coma  - Other condition  - Unable to clinically determine  Clinical Information: "Decreasing responsiveness and increasing dyspnea. No labs this morning. Dobutamine weaned 06/24/15." "She is actively dying and will likely expire in the next few hours. Will give morphine as needed." is documented by Dr. [name] 07/11/2015  Also documented as being "lethargic" by other providers.  Please exercise your independent, professional judgment when responding. A specific answer is not anticipated or expected.   Thank You, Jerral Ralph  RN BSN CCDS [phone] Health Information Management [place]

## 2015-07-13 NOTE — Care Management Important Message (Signed)
Important Message  Patient Details  Name: [name] MRN: [id number] Date of Birth: September 27, 1965   Medicare Important Message Given:  Yes-fourth notification given    Orson Aloe 2015-07-04, 10:54 AM

## 2015-07-13 NOTE — Progress Notes (Signed)
Patient is on comfort care. Morphine drip infusing. Partial assessment completed. Patient appears comfortable. Family at bedside. Support given. Will monitor.

## 2015-07-13 DEATH — deceased

## 2015-07-28 DIAGNOSIS — Z66 Do not resuscitate: Secondary | ICD-10-CM | POA: Insufficient documentation

## 2015-08-10 ENCOUNTER — Encounter: Payer: Medicare Other | Admitting: Internal Medicine

## 2015-10-11 ENCOUNTER — Encounter: Payer: Self-pay | Admitting: *Deleted

## 2015-10-12 ENCOUNTER — Encounter: Payer: Self-pay | Admitting: *Deleted

## 2016-08-15 IMAGING — CR DG CHEST 1V PORT
1 series · 1 of 1 positions shown · non-contrast
Comparison: Chest radiograph dated 03/26/2015 and CT dated
03/27/2015.

CLINICAL DATA: 49-year-old female status post central line
placement. Check position.

EXAM:
PORTABLE CHEST - 1 VIEW

[AP]
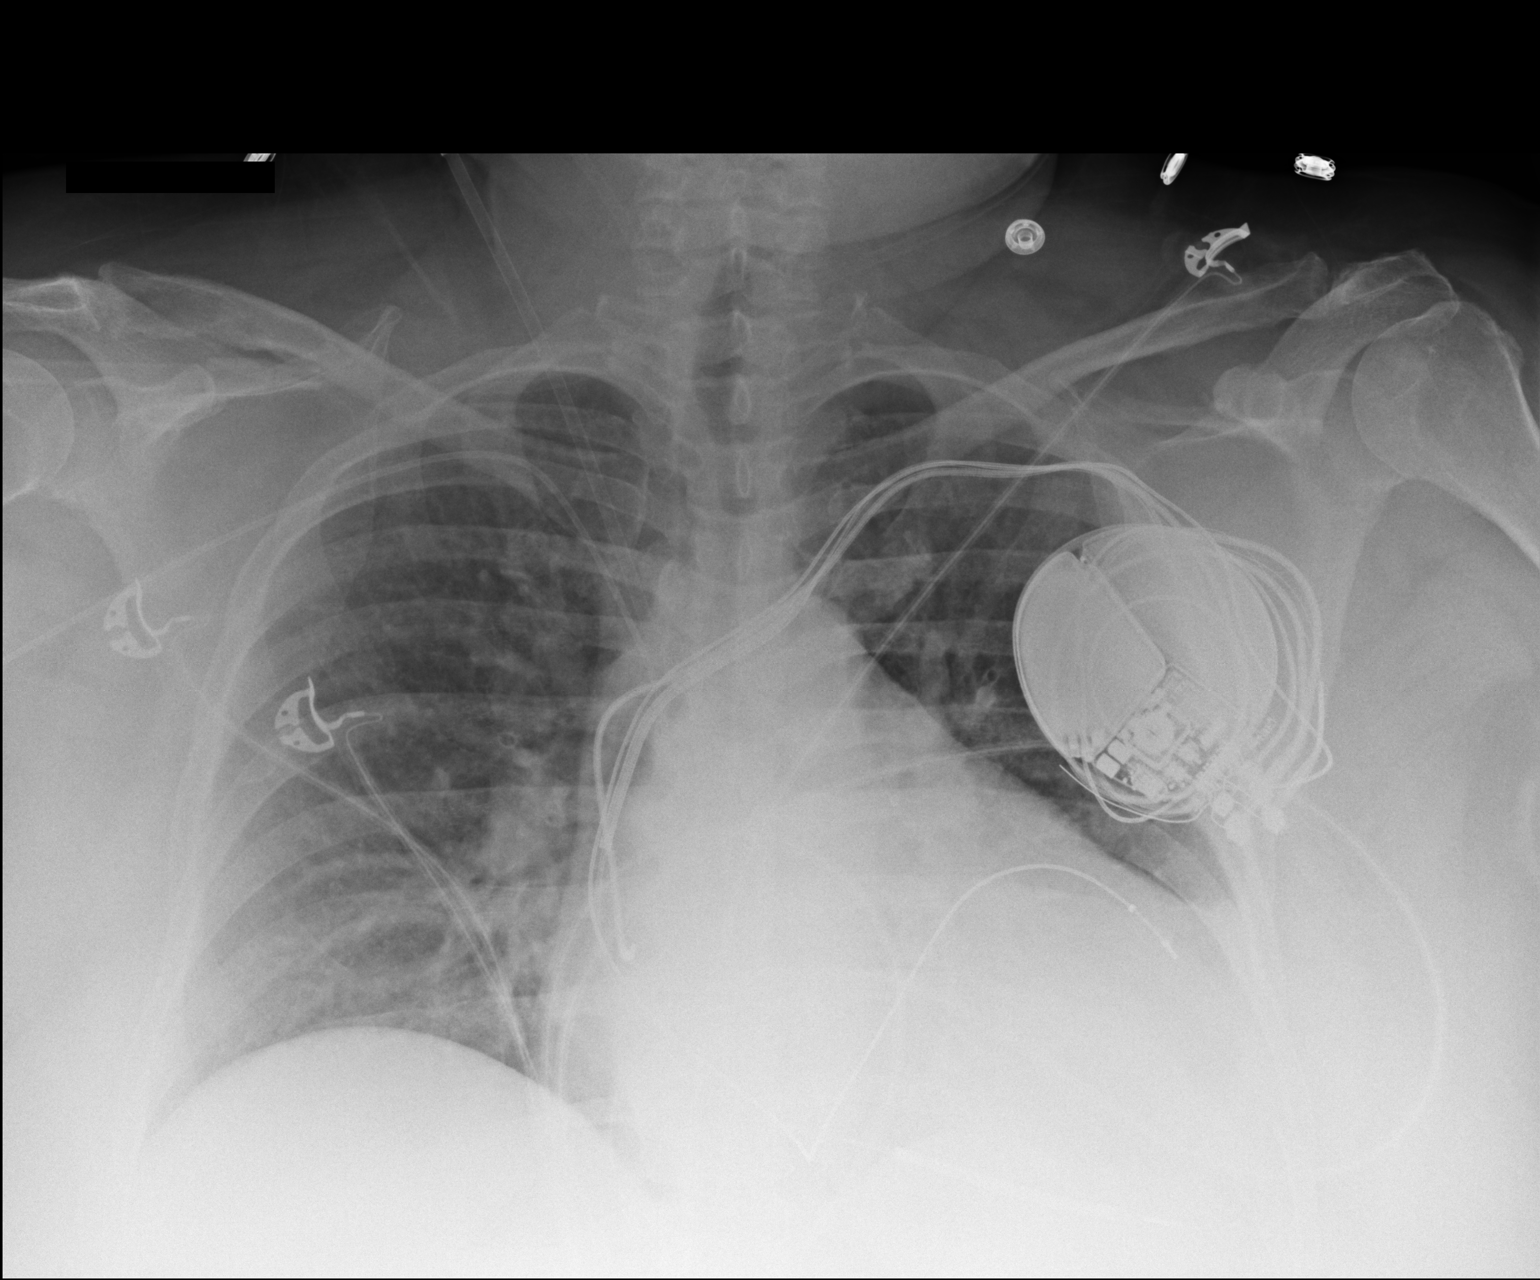

[1 of 1 positions shown; findings below may reference images not displayed]

FINDINGS: Right-sided PICC is noted with tip over central SVC. There is
moderate cardiomegaly. No consolidation, pleural effusion, or
pneumothorax. Left pectoral AICD device.
IMPRESSION: Right-sided PICC with tip over central SVC.

## 2016-10-08 IMAGING — CR DG CHEST 1V PORT
1 series · 1 of 1 positions shown · non-contrast
Comparison: 05/16/2015

CLINICAL DATA: Shortness of breath and syncope 1 day.

EXAM:
PORTABLE CHEST - 1 VIEW

[AP]
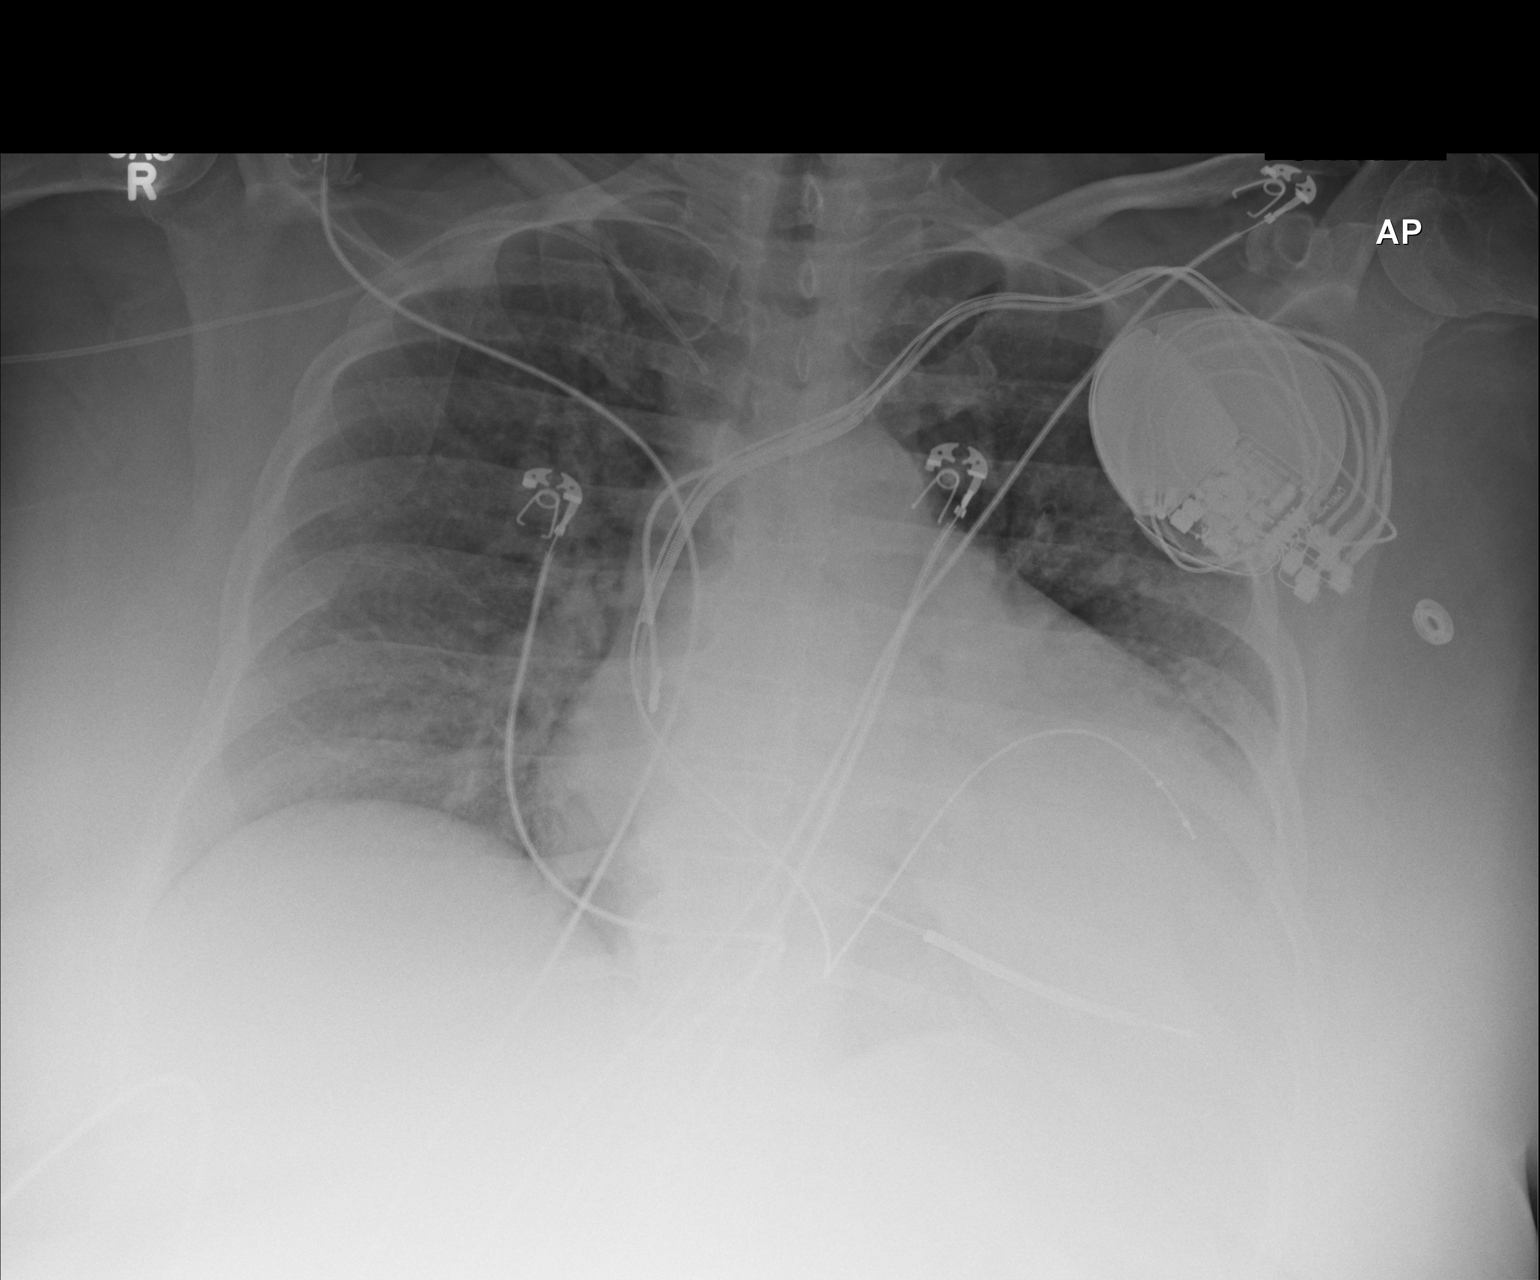

[1 of 1 positions shown; findings below may reference images not displayed]

FINDINGS: Right-sided PICC line has been pulled back slightly with tip
overlying the region of the SVC near the junction of the subclavian
vein. Left-sided pacemaker unchanged.

Lungs are hypoinflated with mild prominence of the perihilar
markings suggesting mild vascular congestion. There is mild stable
cardiomegaly. Remainder of the exam is unchanged.
IMPRESSION: Stable mild cardiomegaly with evidence of mild vascular congestion.

Right-sided PICC line has been pulled back slightly with tip over
the region of the SVC near the junction with the subclavian vein.

## 2016-10-27 IMAGING — CR DG CHEST 1V PORT
1 series · 1 of 1 positions shown · non-contrast
Comparison: 05/26/2015

CLINICAL DATA: Bradycardia, CHF, chronic milrinone infusion, nausea
and vomiting

EXAM:
PORTABLE CHEST 1 VIEW

[portable]
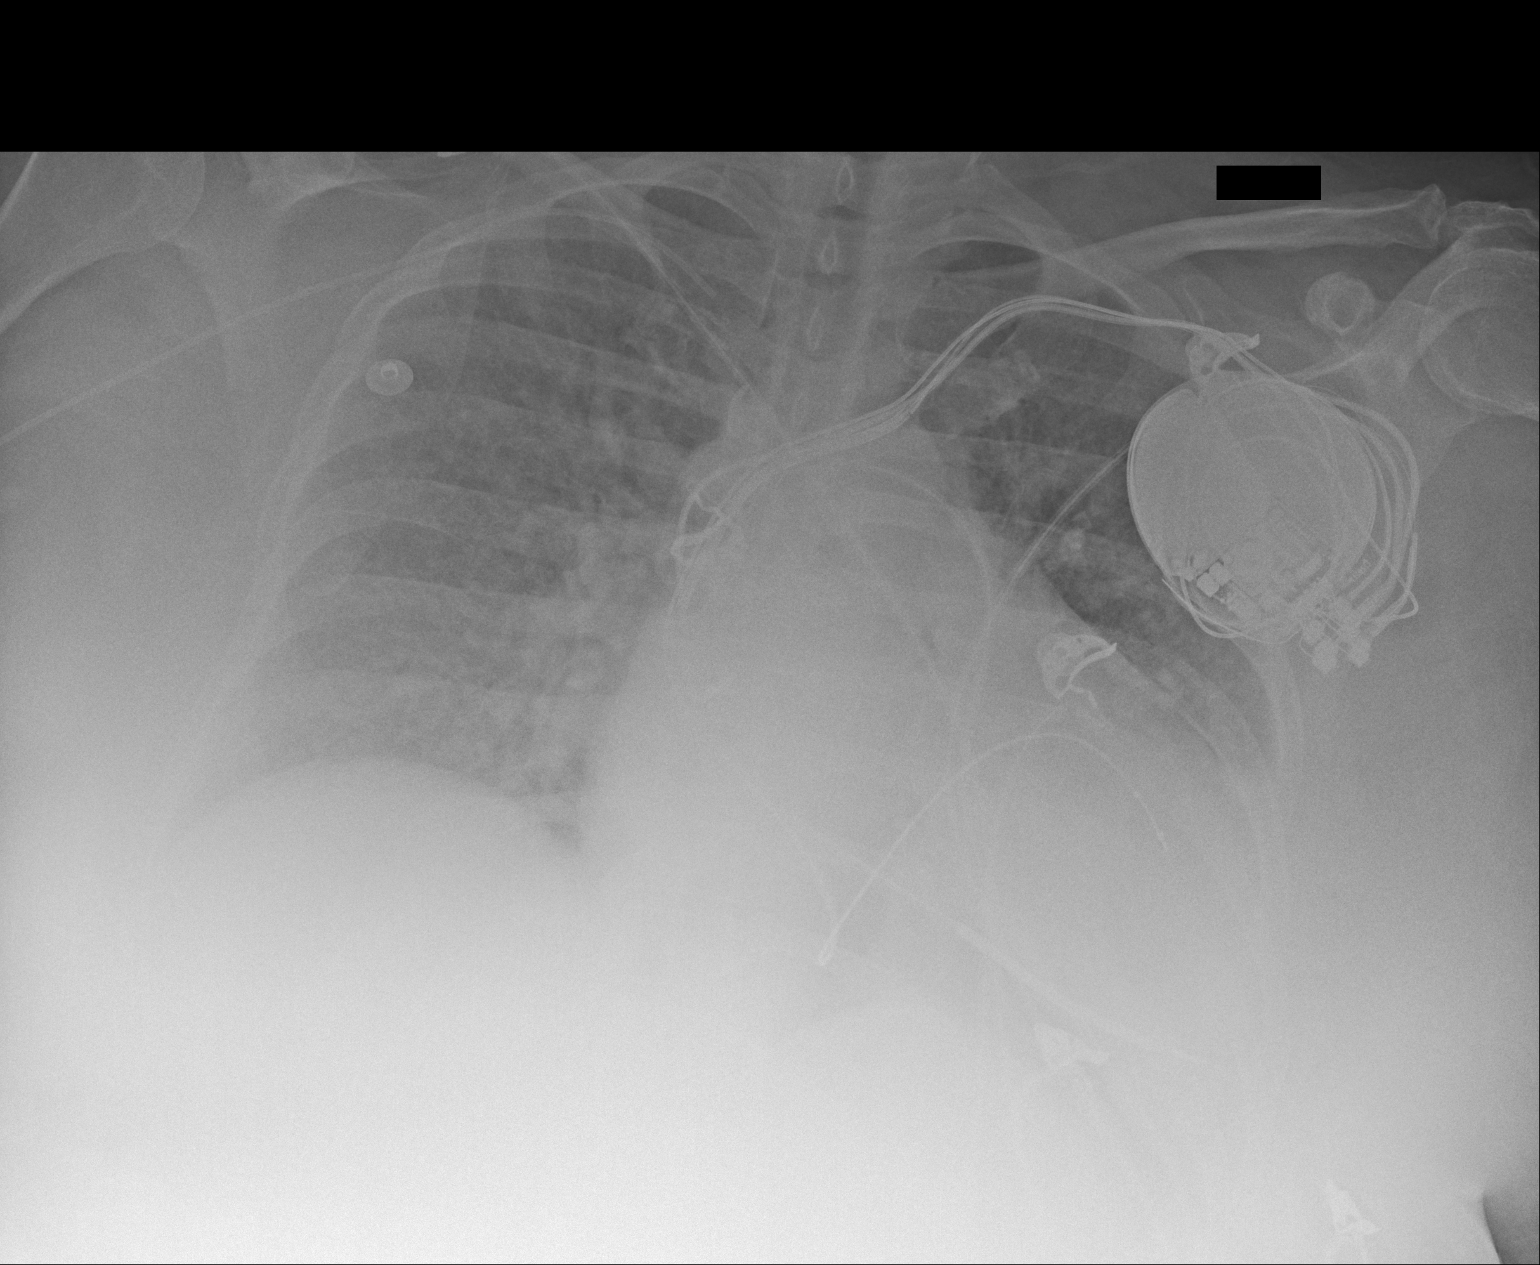

[1 of 1 positions shown; findings below may reference images not displayed]

FINDINGS: Right PICC line tip remains at the right innominate vein as before.
Left subclavian multilead defibrillator/pacer evident. Heart is
enlarged with vascular and interstitial prominence suggesting mild
edema. Limited exam because of body habitus. Increased left base
density suggest chronic atelectasis when compared to the prior CT.
IMPRESSION: Cardiomegaly with increased vascular and interstitial prominence
suggesting mild developing edema pattern compare to 05/26/2015.

## 2016-10-29 IMAGING — DX DG ABD PORTABLE 1V
1 series · 1 of 1 positions shown · non-contrast
Comparison: None.

CLINICAL DATA: Status post nasogastric catheter placement

EXAM:
PORTABLE ABDOMEN - 1 VIEW

[abdomen supine]
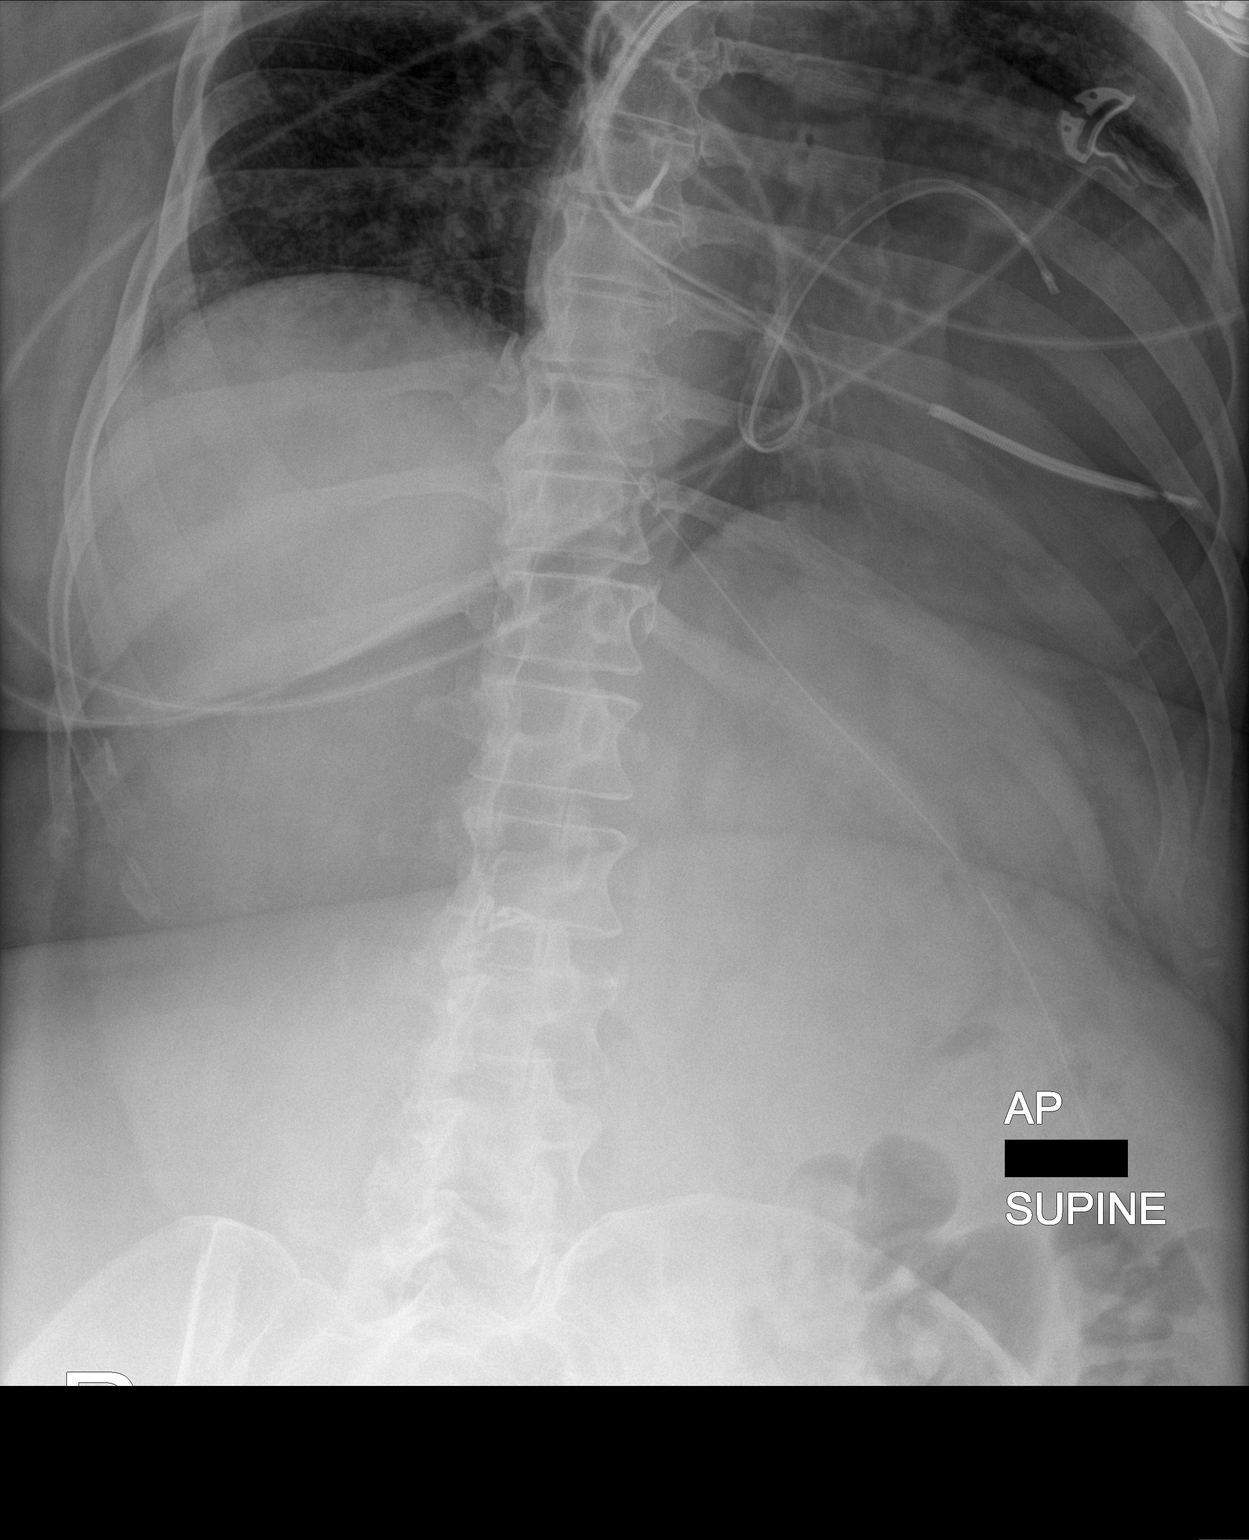

[1 of 1 positions shown; findings below may reference images not displayed]

FINDINGS: Scattered large and small bowel gas is noted. A nasogastric catheter
is noted deep within the stomach.
IMPRESSION: Nasogastric catheter within the stomach.

## 2016-10-29 IMAGING — CR DG CHEST 1V PORT
1 series · 1 of 1 positions shown · non-contrast
Comparison: 06/14/2015 and earlier.

CLINICAL DATA: 49-year-old female central line placement. Initial
encounter.

EXAM:
PORTABLE CHEST 1 VIEW

[AP]
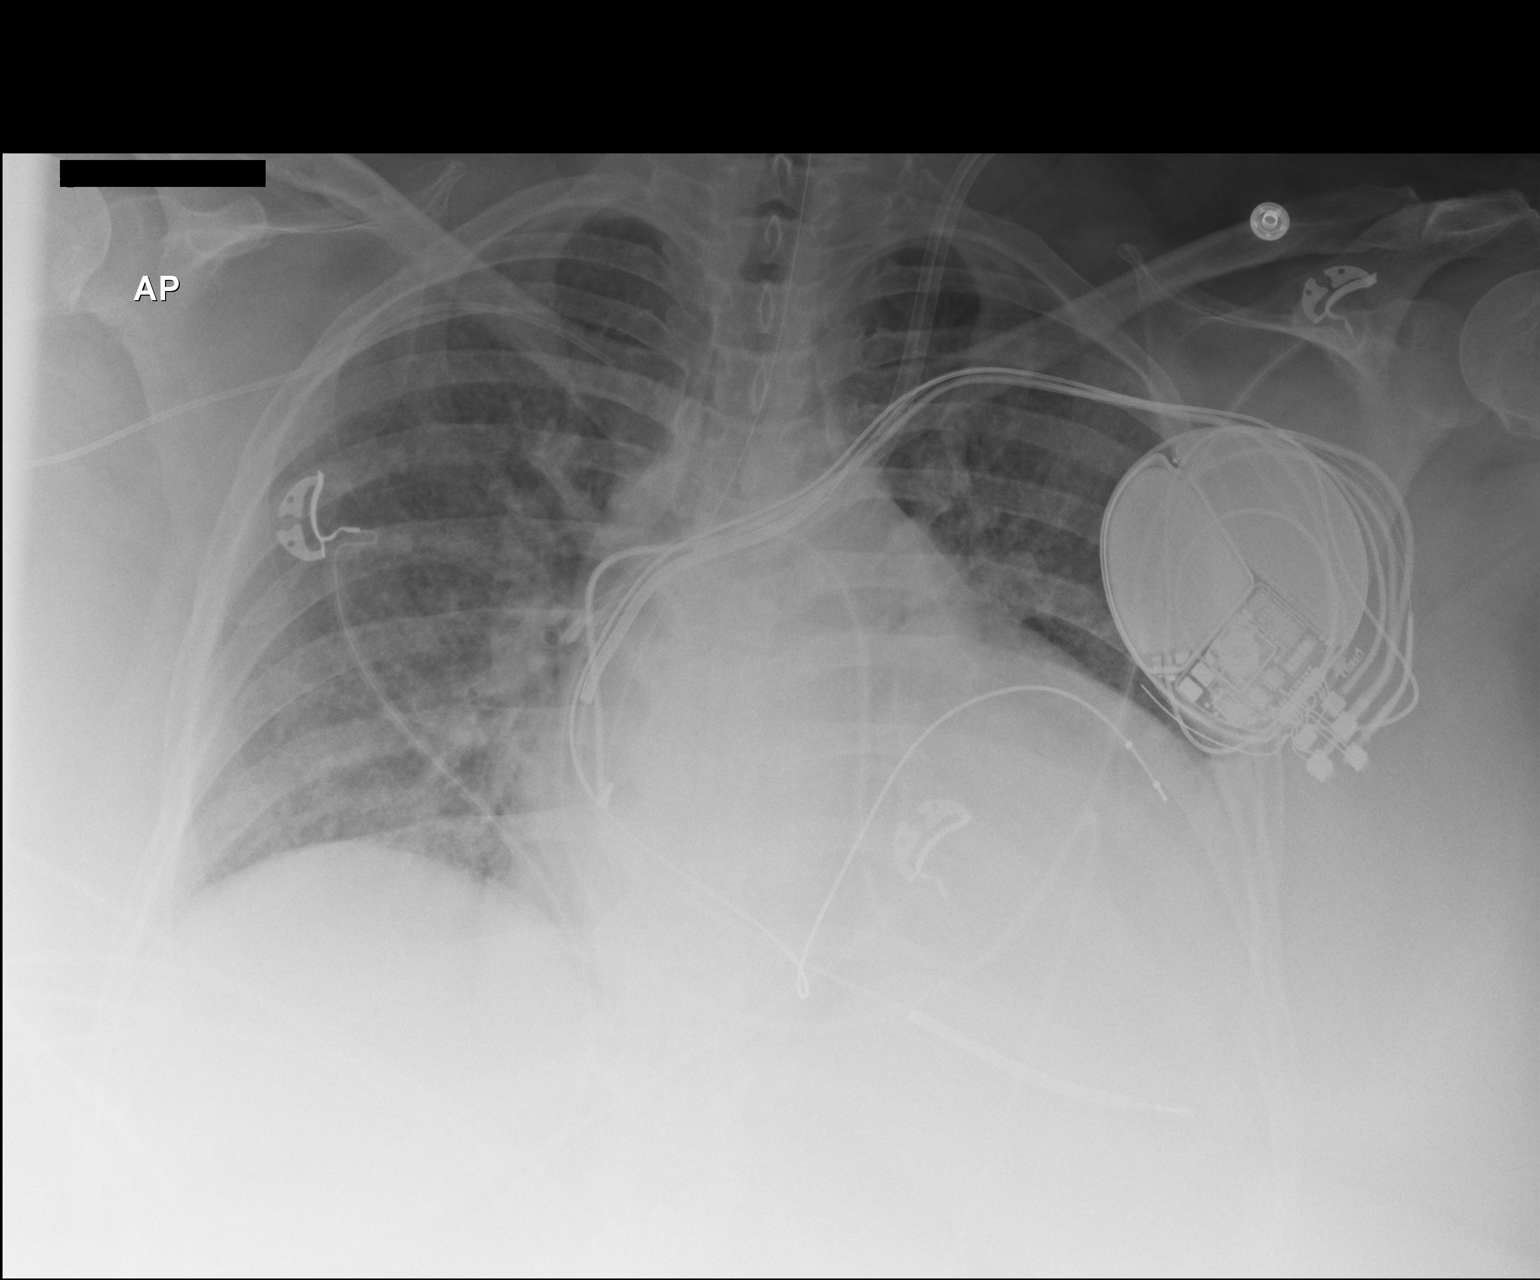

[1 of 1 positions shown; findings below may reference images not displayed]

FINDINGS: Portable AP semi upright view at 6662 hours. Continued low lung
volumes. Pulmonary vascular congestion has regressed. Stable left
chest cardiac AICD. Stable cardiac size and mediastinal contours.

Right PICC line remains in place, tip projects at the level of the
medial right subclavian vein. An enteric tube is now in place in
courses to the epigastrium, tip not included. There is a new left IJ
approach dual lumen dialysis type catheter. Catheter tips project at
the cavoatrial junction level. No pneumothorax. Continued
retrocardiac opacity.
IMPRESSION: 1. Left IJ approach dual lumen dialysis type catheter placed with no
adverse features.
2. Enteric tube placed, courses to the abdomen tip not included.
3. Stable right PICC line.
4. Interval regressed pulmonary edema.

## 2019-01-06 NOTE — Progress Notes (Signed)
REVIEWED-NO ADDITIONAL RECOMMENDATIONS. 

## 2019-01-07 NOTE — Progress Notes (Signed)
REVIEWED.
